# Patient Record
Sex: Female | Born: 1935 | Race: White | Hispanic: No | Marital: Married | Smoking: Never smoker
Health system: Southern US, Community
[De-identification: ages and names within clinical notes are randomized; demographics above are authoritative.]

## PROBLEM LIST (undated history)

## (undated) DIAGNOSIS — G629 Polyneuropathy, unspecified: Secondary | ICD-10-CM

## (undated) DIAGNOSIS — M1990 Unspecified osteoarthritis, unspecified site: Secondary | ICD-10-CM

## (undated) DIAGNOSIS — J449 Chronic obstructive pulmonary disease, unspecified: Secondary | ICD-10-CM

## (undated) DIAGNOSIS — M75100 Unspecified rotator cuff tear or rupture of unspecified shoulder, not specified as traumatic: Secondary | ICD-10-CM

## (undated) DIAGNOSIS — I499 Cardiac arrhythmia, unspecified: Secondary | ICD-10-CM

## (undated) DIAGNOSIS — N390 Urinary tract infection, site not specified: Secondary | ICD-10-CM

## (undated) DIAGNOSIS — I2510 Atherosclerotic heart disease of native coronary artery without angina pectoris: Secondary | ICD-10-CM

## (undated) DIAGNOSIS — F329 Major depressive disorder, single episode, unspecified: Secondary | ICD-10-CM

## (undated) DIAGNOSIS — T8859XA Other complications of anesthesia, initial encounter: Secondary | ICD-10-CM

## (undated) DIAGNOSIS — E785 Hyperlipidemia, unspecified: Secondary | ICD-10-CM

## (undated) DIAGNOSIS — F419 Anxiety disorder, unspecified: Secondary | ICD-10-CM

## (undated) DIAGNOSIS — Z9981 Dependence on supplemental oxygen: Secondary | ICD-10-CM

## (undated) DIAGNOSIS — I509 Heart failure, unspecified: Secondary | ICD-10-CM

## (undated) DIAGNOSIS — T4145XA Adverse effect of unspecified anesthetic, initial encounter: Secondary | ICD-10-CM

## (undated) DIAGNOSIS — M797 Fibromyalgia: Secondary | ICD-10-CM

## (undated) DIAGNOSIS — F32A Depression, unspecified: Secondary | ICD-10-CM

## (undated) DIAGNOSIS — K449 Diaphragmatic hernia without obstruction or gangrene: Secondary | ICD-10-CM

## (undated) HISTORY — DX: Anxiety disorder, unspecified: F41.9

## (undated) HISTORY — PX: APPENDECTOMY: SHX54

## (undated) HISTORY — DX: Unspecified rotator cuff tear or rupture of unspecified shoulder, not specified as traumatic: M75.100

## (undated) HISTORY — DX: Hyperlipidemia, unspecified: E78.5

## (undated) HISTORY — DX: Major depressive disorder, single episode, unspecified: F32.9

## (undated) HISTORY — DX: Depression, unspecified: F32.A

## (undated) HISTORY — DX: Fibromyalgia: M79.7

## (undated) HISTORY — DX: Diaphragmatic hernia without obstruction or gangrene: K44.9

## (undated) HISTORY — PX: CATARACT EXTRACTION: SUR2

## (undated) HISTORY — PX: TONSILLECTOMY: SUR1361

## (undated) HISTORY — PX: ELBOW SURGERY: SHX618

## (undated) HISTORY — PX: DILATION AND CURETTAGE OF UTERUS: SHX78

## (undated) HISTORY — DX: Unspecified osteoarthritis, unspecified site: M19.90

## (undated) HISTORY — DX: Cardiac arrhythmia, unspecified: I49.9

## (undated) HISTORY — DX: Heart failure, unspecified: I50.9

## (undated) HISTORY — DX: Atherosclerotic heart disease of native coronary artery without angina pectoris: I25.10

## (undated) HISTORY — DX: Polyneuropathy, unspecified: G62.9

## (undated) HISTORY — DX: Chronic obstructive pulmonary disease, unspecified: J44.9

---

## 1998-01-26 ENCOUNTER — Emergency Department (HOSPITAL_COMMUNITY): Admission: EM | Admit: 1998-01-26 | Discharge: 1998-01-26 | Payer: Self-pay | Admitting: Emergency Medicine

## 1998-10-10 ENCOUNTER — Other Ambulatory Visit: Admission: RE | Admit: 1998-10-10 | Discharge: 1998-10-10 | Payer: Self-pay | Admitting: Obstetrics and Gynecology

## 1999-05-12 ENCOUNTER — Emergency Department (HOSPITAL_COMMUNITY): Admission: EM | Admit: 1999-05-12 | Discharge: 1999-05-12 | Payer: Self-pay | Admitting: Emergency Medicine

## 1999-05-12 ENCOUNTER — Encounter: Payer: Self-pay | Admitting: Emergency Medicine

## 2000-08-23 ENCOUNTER — Ambulatory Visit (HOSPITAL_COMMUNITY): Admission: RE | Admit: 2000-08-23 | Discharge: 2000-08-23 | Payer: Self-pay | Admitting: Endocrinology

## 2000-08-23 ENCOUNTER — Encounter: Payer: Self-pay | Admitting: Endocrinology

## 2000-10-08 ENCOUNTER — Encounter: Payer: Self-pay | Admitting: Gastroenterology

## 2000-10-08 ENCOUNTER — Encounter: Admission: RE | Admit: 2000-10-08 | Discharge: 2000-10-08 | Payer: Self-pay | Admitting: Gastroenterology

## 2003-11-28 ENCOUNTER — Encounter (INDEPENDENT_AMBULATORY_CARE_PROVIDER_SITE_OTHER): Payer: Self-pay | Admitting: Specialist

## 2003-11-28 ENCOUNTER — Ambulatory Visit (HOSPITAL_COMMUNITY): Admission: RE | Admit: 2003-11-28 | Discharge: 2003-11-28 | Payer: Self-pay | Admitting: Gastroenterology

## 2004-03-19 ENCOUNTER — Encounter: Admission: RE | Admit: 2004-03-19 | Discharge: 2004-03-19 | Payer: Self-pay | Admitting: Gastroenterology

## 2004-10-01 ENCOUNTER — Encounter: Admission: RE | Admit: 2004-10-01 | Discharge: 2004-10-01 | Payer: Self-pay | Admitting: Endocrinology

## 2005-09-13 ENCOUNTER — Emergency Department (HOSPITAL_COMMUNITY): Admission: EM | Admit: 2005-09-13 | Discharge: 2005-09-13 | Payer: Self-pay | Admitting: Emergency Medicine

## 2006-11-17 ENCOUNTER — Ambulatory Visit (HOSPITAL_COMMUNITY): Admission: RE | Admit: 2006-11-17 | Discharge: 2006-11-17 | Payer: Self-pay | Admitting: Endocrinology

## 2008-04-24 ENCOUNTER — Encounter: Admission: RE | Admit: 2008-04-24 | Discharge: 2008-04-24 | Payer: Self-pay | Admitting: Otolaryngology

## 2010-12-19 NOTE — Op Note (Signed)
NAME:  [name], [name]                          ACCOUNT NO.:  [id number]   MEDICAL RECORD NO.:  [id number]                   PATIENT TYPE:  AMB   LOCATION:  ENDO                                 FACILITY:  [facility]   PHYSICIAN:  James L. Malon Kindle., M.D.          DATE OF BIRTH:  May 19, 1927   DATE OF PROCEDURE:  11/28/2003  DATE OF DISCHARGE:                                 OPERATIVE REPORT   PROCEDURE:  Colonoscopy with biopsy.   MEDICATIONS:  Fentanyl 75 mcg, Versed 4 mg IV.   ENDOSCOPE:  Olympus pediatric adjustable colonoscope.   INDICATIONS:  Heme-positive stool in a woman with previous questionable  history of ulcerative colitis.  Did have colitis by biopsy a number of years  ago, but it subsequently resolved.  She has carried a diagnosis of Crohn's  or ulcerative colitis for a number of years.   DESCRIPTION OF PROCEDURE:  The procedure had been explained to the patient  and consent obtained.  With the patient in the left lateral decubitus  position, the Olympus pediatric adjustable colonoscope was inserted and  advanced.  The patient had a long, tortuous colon.  We were eventually able  to reach the cecum with identification of the ileocecal valve.  The scope  was withdrawn and the colon carefully examined.  Multiple biopsies were  taken of endoscopically-normal mucosa throughout.  There was no diverticular  disease.  The scope was withdrawn in the rectum, and the rectum was free of  polyps.  The patient tolerated the procedure well.   ASSESSMENT:  1. Heme-positive stool, 792.1.  2. Normal mucosa endoscopically.   Will check biopsies.                                               James L. Malon Kindle., M.D.    Waldron Session  D:  11/28/2003  T:  11/28/2003  Job:  [id number]   cc:   Jeannett Senior A. Evlyn Kanner, M.D.  [address]  [place]  [place] [address]  Fax: [phone]

## 2011-05-18 ENCOUNTER — Encounter: Payer: Self-pay | Admitting: Pulmonary Disease

## 2011-05-19 ENCOUNTER — Ambulatory Visit (INDEPENDENT_AMBULATORY_CARE_PROVIDER_SITE_OTHER): Payer: Medicare Other | Admitting: Pulmonary Disease

## 2011-05-19 ENCOUNTER — Encounter: Payer: Self-pay | Admitting: Pulmonary Disease

## 2011-05-19 VITALS — BP 122/78 | HR 69 | Temp 98.5°F | Ht 66.0 in | Wt 131.6 lb

## 2011-05-19 DIAGNOSIS — J479 Bronchiectasis, uncomplicated: Secondary | ICD-10-CM | POA: Insufficient documentation

## 2011-05-19 NOTE — Patient Instructions (Signed)
Take nexium 40mg  one each evening, and stay on your prevacid in the am. (samples given) Will schedule for breathing tests, and see you back in 2-3 weeks on the same day Need a sputum culture (this is very important).  Please cough up the day of your visit, and keep in fridge until you come to visit. Get chlorpheniramine 8mg  at drugstore, and take everynight at bedtime to treat for possible postnasal drip.

## 2011-05-19 NOTE — Progress Notes (Signed)
  Subjective:    Patient ID: [name], female    DOB: 1927/05/01, 77 y.o.   MRN: [id number]  HPI The patient is an 77 year old female who is referred for multiple pulmonary issues.  The patient states she was "born with asthma", and has had recurring bouts of pneumonia throughout her life.  She has been told that she has COPD, and has had what sounds like PFTs in the distant past.  Most recently she has had worsening breathing issues, and has been using a rescue inhaler.  This summer she had an episode of pneumonia and was treated with antibiotics.  She keeps a chronic cough, with normally yellow mucus.  She had a recent bout of hemoptysis that was limited, and has now resolved.  She describes a one block dyspnea on exertion at a slow pace, and we'll get winded bringing groceries in from the car.  She also has chronic throat clearing, and describes a globus sensation.  She denies any sinus congestion but does have persistent postnasal drip.  She also has significant reflux symptoms, and takes low dose proton pump inhibitor.  She has had a recent CT chest that I have reviewed, and shows significant bronchiectasis in the right middle lobe, lingula, and left lower lobe.   Review of Systems  Constitutional: Negative for fever and unexpected weight change.  HENT: Positive for sore throat, sneezing, trouble swallowing and dental problem. Negative for ear pain, nosebleeds, congestion, rhinorrhea and postnasal drip.   Eyes: Negative for redness and itching.  Respiratory: Positive for cough and shortness of breath. Negative for chest tightness and wheezing.   Cardiovascular: Positive for chest pain and palpitations. Negative for leg swelling.  Gastrointestinal: Negative for nausea and vomiting.  Musculoskeletal: Positive for joint swelling.  Skin: Negative for rash.  Neurological: Positive for headaches.  Hematological: Does not bruise/bleed easily.  Psychiatric/Behavioral: Positive for dysphoric mood.  The patient is nervous/anxious.        Objective:   Physical Exam Constitutional:  Well developed, no acute distress  HENT:  Nares patent without discharge  Oropharynx without exudate, palate and uvula are normal  Eyes:  Perrla, eomi, no scleral icterus  Neck:  No JVD, no TMG  Cardiovascular:  Irregular rhythm, no rubs or gallops.  No murmurs        Intact distal pulses but decreased  Pulmonary : decreased breath sounds, no stridor or respiratory distress   Scattered crackles, no rhonchi, or wheezing  Abdominal:  Soft, nondistended, bowel sounds present.  No tenderness noted.   Musculoskeletal:  mild lower extremity edema noted, significant varicosities.  Lymph Nodes:  No cervical lymphadenopathy noted  Skin:  No cyanosis noted  Neurologic:  Alert, appropriate, moves all 4 extremities without obvious deficit.         Assessment & Plan:

## 2011-05-19 NOTE — Assessment & Plan Note (Signed)
The pt has significant bronchiectasis on her current ct chest, and has chronic mucus production and recent episode of limited hemoptysis.  I suspect she has underlying copd from her disease, but will need to do pfts for documentation.  I also stressed to her the importance of culturing her respiratory flora, and ruling out unusual pathogens such as gram-negative rods or atypical mycobacteria.  I have asked her to bring a sputum specimen to the next visit.  If she is found to have airflow obstruction on PFTs, she would probably benefit from a bronchodilator/inhaled corticosteroid combination.  Her cough today sounds more upper airway in origin and then lower, and she is describing a classic globus sensation.  She has a history of reflux disease, with breakthrough symptoms despite taking low dose Prevacid.

## 2011-06-16 ENCOUNTER — Ambulatory Visit (INDEPENDENT_AMBULATORY_CARE_PROVIDER_SITE_OTHER): Payer: Medicare Other | Admitting: Pulmonary Disease

## 2011-06-16 ENCOUNTER — Encounter: Payer: Self-pay | Admitting: Pulmonary Disease

## 2011-06-16 ENCOUNTER — Other Ambulatory Visit: Payer: Medicare Other

## 2011-06-16 VITALS — BP 122/84 | HR 79 | Temp 98.3°F | Ht 68.0 in | Wt 128.0 lb

## 2011-06-16 DIAGNOSIS — J479 Bronchiectasis, uncomplicated: Secondary | ICD-10-CM

## 2011-06-16 LAB — PULMONARY FUNCTION TEST

## 2011-06-16 NOTE — Progress Notes (Signed)
  Subjective:    Patient ID: [name], female    DOB: 1926/08/06, 77 y.o.   MRN: [id number]  HPI The patient comes in today for followup of her known bronchiectasis, as well as cough and dyspnea on exertion.  She has had pulmonary function studies today, and this shows no obstruction on spirometry, but does show air trapping on lung volumes.  There is no restriction, and only minimal reduction in her diffusion capacity.  I have reviewed the study with her in detail, and answered all of her questions.  Of note, the patient has brought in a sputum specimen today so that we can identify her colonizing flora.   Review of Systems  Constitutional: Negative for fever and unexpected weight change.  HENT: Positive for congestion, mouth sores, dental problem, postnasal drip and sinus pressure. Negative for ear pain, sore throat, rhinorrhea and trouble swallowing.   Eyes: Positive for itching.  Respiratory: Positive for cough, chest tightness, shortness of breath and wheezing.   Cardiovascular: Positive for palpitations. Negative for leg swelling.  Gastrointestinal: Positive for nausea. Negative for vomiting.  Genitourinary: Positive for dysuria.  Musculoskeletal: Negative for joint swelling.  Skin: Negative for rash.  Neurological: Positive for headaches.  Hematological: Bruises/bleeds easily.  Psychiatric/Behavioral: Positive for dysphoric mood. The patient is nervous/anxious.        Objective:   Physical Exam Thin female in no acute distress Chest with basilar crackles, no wheezes Heart exam with regular rate and rhythm Lower extremities without edema, no cyanosis noted Alert and oriented, moves all 4 extremities.       Assessment & Plan:

## 2011-06-16 NOTE — Patient Instructions (Addendum)
Will try on arcapta one inhalation each am Will call you with the results of your culture Go back to prevacid once a day Use antihistamine at bedtime as needed for postnasal drip Will check immunoglobulin levels to make sure immune system is functioning properly Please call me in 3 weeks with your response to the new inhaler.  Will see you back in 3mos if doing well.

## 2011-06-16 NOTE — Assessment & Plan Note (Signed)
The patient has known bronchiectasis by CT chest, and does have subtle airflow obstruction on her PFTs manifested as air trapping.  Since she does have the complaint of dyspnea on exertion, I would like to try her on a LABA to see if she sees improvement in her breathing.  She also has a persistent cough that I continue to feel is more upper airway than lower in origin.  She did not see a big change with b.i.d. Proton pump inhibitor, but I did ask her to continue with an antihistamine at bedtime since she continues to feel postnasal drip with a globus sensation.  She may have improved mucus ciliary clearance with the long acting beta agonists.

## 2011-06-16 NOTE — Progress Notes (Deleted)
  Subjective:    Patient ID: [name], female    DOB: Aug 09, 1926, 77 y.o.   MRN: [id number]  HPI    Review of Systems  Constitutional: Negative for fever and unexpected weight change.  HENT: Negative for ear pain, nosebleeds, congestion, sore throat, rhinorrhea, sneezing, trouble swallowing, dental problem, postnasal drip and sinus pressure.   Eyes: Negative for redness and itching.  Respiratory: Negative for cough, chest tightness, shortness of breath and wheezing.   Cardiovascular: Negative for palpitations and leg swelling.  Gastrointestinal: Negative for nausea and vomiting.  Genitourinary: Negative for dysuria.  Musculoskeletal: Negative for joint swelling.  Skin: Negative for rash.  Neurological: Negative for headaches.  Hematological: Does not bruise/bleed easily.  Psychiatric/Behavioral: Negative for dysphoric mood. The patient is not nervous/anxious.        Objective:   Physical Exam        Assessment & Plan:

## 2011-06-16 NOTE — Progress Notes (Signed)
PFT done today. 

## 2011-06-17 LAB — IGG, IGA, IGM: IgM, Serum: 112 mg/dL (ref 52–322)

## 2011-06-19 LAB — RESPIRATORY CULTURE OR RESPIRATORY AND SPUTUM CULTURE

## 2011-07-10 ENCOUNTER — Telehealth: Payer: Self-pay | Admitting: Pulmonary Disease

## 2011-07-10 NOTE — Telephone Encounter (Signed)
Her very final culture was negative  If she feels current inhaler is not helping, would like to try her on dulera 100/5  2 inhalations am and pm..rinse mouth well Have her come by and pick up sample and be shown how to use.  She then should call us in 3-4 weeks with update to see if this has helped her.

## 2011-07-10 NOTE — Telephone Encounter (Signed)
I spoke with pt and she states she never got a call back about her final result on her sputum culture. Pt also states she does not feel like the arcapta inhaler is helping with her breathing. She states she can;t tell a difference like she can when she uses her albuterol inhaler. Please advise Dr. Shelle Iron, thanks

## 2011-07-10 NOTE — Telephone Encounter (Signed)
Spoke with pt and notified of recs per [facility]. Pt states that she thinks that she does want to try dulera, but she is unsure of when she would be able to come in and have nurse show her how to use. She states will call us back and let us know if/when she can come in.

## 2011-07-16 DIAGNOSIS — E039 Hypothyroidism, unspecified: Secondary | ICD-10-CM | POA: Diagnosis not present

## 2011-07-16 DIAGNOSIS — D510 Vitamin B12 deficiency anemia due to intrinsic factor deficiency: Secondary | ICD-10-CM | POA: Diagnosis not present

## 2011-07-16 DIAGNOSIS — I2510 Atherosclerotic heart disease of native coronary artery without angina pectoris: Secondary | ICD-10-CM | POA: Diagnosis not present

## 2011-07-16 DIAGNOSIS — J441 Chronic obstructive pulmonary disease with (acute) exacerbation: Secondary | ICD-10-CM | POA: Diagnosis not present

## 2011-07-24 DIAGNOSIS — I2510 Atherosclerotic heart disease of native coronary artery without angina pectoris: Secondary | ICD-10-CM | POA: Diagnosis not present

## 2011-07-24 DIAGNOSIS — E039 Hypothyroidism, unspecified: Secondary | ICD-10-CM | POA: Diagnosis not present

## 2011-07-24 DIAGNOSIS — J441 Chronic obstructive pulmonary disease with (acute) exacerbation: Secondary | ICD-10-CM | POA: Diagnosis not present

## 2011-07-24 DIAGNOSIS — D510 Vitamin B12 deficiency anemia due to intrinsic factor deficiency: Secondary | ICD-10-CM | POA: Diagnosis not present

## 2011-08-07 DIAGNOSIS — I2510 Atherosclerotic heart disease of native coronary artery without angina pectoris: Secondary | ICD-10-CM | POA: Diagnosis not present

## 2011-08-07 DIAGNOSIS — J441 Chronic obstructive pulmonary disease with (acute) exacerbation: Secondary | ICD-10-CM | POA: Diagnosis not present

## 2011-08-07 DIAGNOSIS — E039 Hypothyroidism, unspecified: Secondary | ICD-10-CM | POA: Diagnosis not present

## 2011-08-07 DIAGNOSIS — D510 Vitamin B12 deficiency anemia due to intrinsic factor deficiency: Secondary | ICD-10-CM | POA: Diagnosis not present

## 2011-08-08 ENCOUNTER — Other Ambulatory Visit: Payer: Self-pay | Admitting: Internal Medicine

## 2011-08-08 ENCOUNTER — Telehealth: Payer: Self-pay | Admitting: Internal Medicine

## 2011-08-08 MED ORDER — AMOXICILLIN-POT CLAVULANATE 875-125 MG PO TABS: 1.0000 | ORAL_TABLET | Freq: Two times a day (BID) | ORAL | Status: AC

## 2011-08-08 NOTE — Telephone Encounter (Signed)
Coughing up yellow sputum with some blood streaks, requested antibiotics.  Provided Augmentin.

## 2011-08-22 NOTE — Telephone Encounter (Signed)
error 

## 2011-08-25 DIAGNOSIS — E039 Hypothyroidism, unspecified: Secondary | ICD-10-CM | POA: Diagnosis not present

## 2011-08-25 DIAGNOSIS — J441 Chronic obstructive pulmonary disease with (acute) exacerbation: Secondary | ICD-10-CM | POA: Diagnosis not present

## 2011-08-25 DIAGNOSIS — D510 Vitamin B12 deficiency anemia due to intrinsic factor deficiency: Secondary | ICD-10-CM | POA: Diagnosis not present

## 2011-08-25 DIAGNOSIS — I2510 Atherosclerotic heart disease of native coronary artery without angina pectoris: Secondary | ICD-10-CM | POA: Diagnosis not present

## 2011-09-01 DIAGNOSIS — E785 Hyperlipidemia, unspecified: Secondary | ICD-10-CM | POA: Diagnosis not present

## 2011-09-01 DIAGNOSIS — M810 Age-related osteoporosis without current pathological fracture: Secondary | ICD-10-CM | POA: Diagnosis not present

## 2011-09-01 DIAGNOSIS — E538 Deficiency of other specified B group vitamins: Secondary | ICD-10-CM | POA: Diagnosis not present

## 2011-09-01 DIAGNOSIS — J479 Bronchiectasis, uncomplicated: Secondary | ICD-10-CM | POA: Diagnosis not present

## 2011-09-01 DIAGNOSIS — J189 Pneumonia, unspecified organism: Secondary | ICD-10-CM | POA: Diagnosis not present

## 2011-09-01 DIAGNOSIS — E039 Hypothyroidism, unspecified: Secondary | ICD-10-CM | POA: Diagnosis not present

## 2011-09-01 DIAGNOSIS — I2510 Atherosclerotic heart disease of native coronary artery without angina pectoris: Secondary | ICD-10-CM | POA: Diagnosis not present

## 2011-09-10 DIAGNOSIS — M25569 Pain in unspecified knee: Secondary | ICD-10-CM | POA: Diagnosis not present

## 2011-09-12 DIAGNOSIS — J441 Chronic obstructive pulmonary disease with (acute) exacerbation: Secondary | ICD-10-CM | POA: Diagnosis not present

## 2011-09-12 DIAGNOSIS — I2510 Atherosclerotic heart disease of native coronary artery without angina pectoris: Secondary | ICD-10-CM | POA: Diagnosis not present

## 2011-09-12 DIAGNOSIS — D510 Vitamin B12 deficiency anemia due to intrinsic factor deficiency: Secondary | ICD-10-CM | POA: Diagnosis not present

## 2011-09-12 DIAGNOSIS — E039 Hypothyroidism, unspecified: Secondary | ICD-10-CM | POA: Diagnosis not present

## 2011-09-14 DIAGNOSIS — J441 Chronic obstructive pulmonary disease with (acute) exacerbation: Secondary | ICD-10-CM | POA: Diagnosis not present

## 2011-09-14 DIAGNOSIS — M25569 Pain in unspecified knee: Secondary | ICD-10-CM | POA: Diagnosis not present

## 2011-09-14 DIAGNOSIS — D510 Vitamin B12 deficiency anemia due to intrinsic factor deficiency: Secondary | ICD-10-CM | POA: Diagnosis not present

## 2011-09-14 DIAGNOSIS — I2510 Atherosclerotic heart disease of native coronary artery without angina pectoris: Secondary | ICD-10-CM | POA: Diagnosis not present

## 2011-09-14 DIAGNOSIS — E039 Hypothyroidism, unspecified: Secondary | ICD-10-CM | POA: Diagnosis not present

## 2011-09-15 ENCOUNTER — Ambulatory Visit: Payer: Medicare Other | Admitting: Pulmonary Disease

## 2011-09-23 ENCOUNTER — Ambulatory Visit (INDEPENDENT_AMBULATORY_CARE_PROVIDER_SITE_OTHER): Payer: Medicare Other | Admitting: Pulmonary Disease

## 2011-09-23 ENCOUNTER — Encounter: Payer: Self-pay | Admitting: Pulmonary Disease

## 2011-09-23 VITALS — BP 120/78 | HR 68 | Temp 98.3°F | Ht 68.0 in | Wt 132.2 lb

## 2011-09-23 DIAGNOSIS — J479 Bronchiectasis, uncomplicated: Secondary | ICD-10-CM | POA: Diagnosis not present

## 2011-09-23 MED ORDER — ALBUTEROL SULFATE HFA 108 (90 BASE) MCG/ACT IN AERS: 2.0000 | INHALATION_SPRAY | Freq: Four times a day (QID) | RESPIRATORY_TRACT | Status: DC | PRN

## 2011-09-23 NOTE — Patient Instructions (Signed)
Continue on albuterol as needed for shortness of breath Stay as active as possible, keep strength up. followup with me in 6mos, but call if you have worsening congestion or increased mucus production.

## 2011-09-23 NOTE — Progress Notes (Signed)
  Subjective:    Patient ID: [name], female    DOB: 07/01/1927, 78 y.o.   MRN: [id number]  HPI Patient in today for followup of her known bronchiectasis.  She also has some mild airflow obstruction manifesting as air trapping.  She has been tried on a long-acting beta agonist, as well as dulera, and has not seen any improvement in her breathing over generic albuterol as needed.  The patient currently has no significant chest congestion, and only scant pale mucous.  She does not feel that she has chest congestion, but has been having issues with a dental abscess for which she is currently on abx.    Review of Systems  Constitutional: Negative for fever and unexpected weight change.  HENT: Positive for rhinorrhea and dental problem. Negative for ear pain, nosebleeds, congestion, sore throat, sneezing, trouble swallowing, postnasal drip and sinus pressure.   Eyes: Positive for itching. Negative for redness.  Respiratory: Positive for cough, chest tightness, shortness of breath and wheezing.   Cardiovascular: Negative for palpitations and leg swelling.  Gastrointestinal: Negative for nausea and vomiting.  Genitourinary: Negative for dysuria.  Musculoskeletal: Positive for joint swelling.  Skin: Negative for rash.  Neurological: Negative for headaches.  Hematological: Does not bruise/bleed easily.  Psychiatric/Behavioral: Negative for dysphoric mood. The patient is not nervous/anxious.        Objective:   Physical Exam Frail-appearing female in no acute distress Nose without purulence or discharge noted Chest with a few scattered crackles, but otherwise clear and free of wheezing Cardiac exam with regular rate and rhythm Lower extremities without significant edema, no cyanosis Alert and oriented, moves all 4 extremities.       Assessment & Plan:

## 2011-09-23 NOTE — Assessment & Plan Note (Signed)
CT chest 2012:  Bronchiectasis in RML, lingula, LLL. PFT's 06/2011: +airtrapping, no restriction, DLCO 76% pred.  Sputum 06/2011: normal flora, no AFB seen Serum Ig's 2012: normal levels.  No change in breathing or mucus with a trial of arcapta and dulera.   The patient appears to be stable from a bronchiectasis standpoint, and is at her usual baseline with respect to cough with mucous production.  She really did not see a difference in her breathing with arcapta or dulera, and wishes to stay on albuterol as needed only.  I have again instructed her on the red flags for bronchiectasis patients that indicate she needs treatment with antibiotics.  I have reiterated again that we can never completely sterilize her pulmonary secretions, and attempts to do so will only result in resistant organisms.  If doing well, she will followup with me again in 6 months.

## 2011-10-05 DIAGNOSIS — M25569 Pain in unspecified knee: Secondary | ICD-10-CM | POA: Diagnosis not present

## 2011-10-05 DIAGNOSIS — M19019 Primary osteoarthritis, unspecified shoulder: Secondary | ICD-10-CM | POA: Diagnosis not present

## 2011-10-12 ENCOUNTER — Ambulatory Visit (INDEPENDENT_AMBULATORY_CARE_PROVIDER_SITE_OTHER)
Admission: RE | Admit: 2011-10-12 | Discharge: 2011-10-12 | Disposition: A | Discharge status: Home / Self Care | Payer: Medicare Other | Source: Ambulatory Visit | Attending: Pulmonary Disease | Admitting: Pulmonary Disease

## 2011-10-12 ENCOUNTER — Encounter: Payer: Self-pay | Admitting: Pulmonary Disease

## 2011-10-12 ENCOUNTER — Ambulatory Visit (INDEPENDENT_AMBULATORY_CARE_PROVIDER_SITE_OTHER): Payer: Medicare Other | Admitting: Pulmonary Disease

## 2011-10-12 VITALS — BP 118/70 | HR 93 | Temp 98.2°F | Ht 67.5 in | Wt 129.8 lb

## 2011-10-12 DIAGNOSIS — R079 Chest pain, unspecified: Secondary | ICD-10-CM | POA: Diagnosis not present

## 2011-10-12 DIAGNOSIS — R0602 Shortness of breath: Secondary | ICD-10-CM | POA: Diagnosis not present

## 2011-10-12 DIAGNOSIS — J479 Bronchiectasis, uncomplicated: Secondary | ICD-10-CM | POA: Diagnosis not present

## 2011-10-12 DIAGNOSIS — R042 Hemoptysis: Secondary | ICD-10-CM | POA: Diagnosis not present

## 2011-10-12 DIAGNOSIS — J449 Chronic obstructive pulmonary disease, unspecified: Secondary | ICD-10-CM | POA: Diagnosis not present

## 2011-10-12 MED ORDER — LEVOFLOXACIN 750 MG PO TABS: 750.0000 mg | ORAL_TABLET | Freq: Every day | ORAL | Status: AC

## 2011-10-12 NOTE — Progress Notes (Signed)
  Subjective:    Patient ID: [name], female    DOB: 07-22-1927, 78 y.o.   MRN: [id number]  HPI The patient comes in today for an acute sick visit.  She has known bronchiectasis, but had been doing fairly well.  She recently began to cough up bright and dark red blood, but it is difficult for her to tell if it is coming from her throat or out of her chest.  She denies any epistaxis, but has had some bleeding from her gums.  She also has had recent dental work for an abscess.  She denies significant chest congestion, but does cough up purulent mucus at times.  She has also had sweats.  Her quantity of hemoptysis has been quite small.   Review of Systems  Constitutional: Positive for fever.  HENT: Positive for congestion, sore throat, rhinorrhea, trouble swallowing, dental problem and postnasal drip. Negative for ear pain, nosebleeds, sneezing and sinus pressure.   Eyes: Positive for redness and itching.  Respiratory: Positive for cough, chest tightness, shortness of breath and wheezing.   Cardiovascular: Positive for palpitations and leg swelling.  Gastrointestinal: Positive for nausea. Negative for vomiting.  Genitourinary: Negative for dysuria.  Musculoskeletal: Positive for joint swelling.  Skin: Negative for rash.  Neurological: Positive for headaches.  Hematological: Bruises/bleeds easily.  Psychiatric/Behavioral: Positive for dysphoric mood. The patient is nervous/anxious.        Objective:   Physical Exam Thin, frail, female in no acute distress Nose without purulence or bleeding noted Oropharynx without obvious bleeding site No cervical lymphadenopathy noted Chest with mildly decreased breath sounds, a few crackles, no wheezes Cardiac exam with regular rate and rhythm Lower extremities without edema, no cyanosis and no calf tenderness Alert and oriented, moves all 4 extremities.      Assessment & Plan:

## 2011-10-12 NOTE — Patient Instructions (Signed)
Will treat with levaquin 750mg  one a day for 7 days. Will check a cxr today, and will call you with results. If you continue to have throat discomfort, and coughing up blood, will have ENT take a look at your voice box.  Let me know if you are not better.

## 2011-10-12 NOTE — Assessment & Plan Note (Signed)
The patient has hemoptysis of the source, but with her history of bronchiectasis, this may simply represent an early acute exacerbation.  She does not have any evidence for bleeding in the upper airway by my exam, but it is limited.  She does have some ongoing throat issues, and therefore if her hemoptysis does not resolve with a course of antibiotics, would have her evaluated by otolaryngology.  We'll check a chest x-ray today for completeness.

## 2011-10-13 ENCOUNTER — Telehealth: Payer: Self-pay | Admitting: Pulmonary Disease

## 2011-10-13 ENCOUNTER — Other Ambulatory Visit: Payer: Self-pay | Admitting: Endocrinology

## 2011-10-13 DIAGNOSIS — D510 Vitamin B12 deficiency anemia due to intrinsic factor deficiency: Secondary | ICD-10-CM | POA: Diagnosis not present

## 2011-10-13 DIAGNOSIS — J441 Chronic obstructive pulmonary disease with (acute) exacerbation: Secondary | ICD-10-CM | POA: Diagnosis not present

## 2011-10-13 DIAGNOSIS — E039 Hypothyroidism, unspecified: Secondary | ICD-10-CM | POA: Diagnosis not present

## 2011-10-13 DIAGNOSIS — E041 Nontoxic single thyroid nodule: Secondary | ICD-10-CM

## 2011-10-13 DIAGNOSIS — R9389 Abnormal findings on diagnostic imaging of other specified body structures: Secondary | ICD-10-CM

## 2011-10-13 DIAGNOSIS — I2510 Atherosclerotic heart disease of native coronary artery without angina pectoris: Secondary | ICD-10-CM | POA: Diagnosis not present

## 2011-10-13 NOTE — Telephone Encounter (Signed)
We did not order visiting nurse, nor do I do this for a pt because they take an abx..  This needs to be handled by primary md if he or she chooses to do so.

## 2011-10-13 NOTE — Progress Notes (Signed)
Quick Note:  Spoke with pt and notified of results per Dr. Shelle Iron. Pt verbalized understanding and denied any questions. Order for ct chest was sent to [facility] ______

## 2011-10-13 NOTE — Telephone Encounter (Signed)
Spoke with Rosann Auerbach, nurse with [facility]. She states needs VO to visit the pt twice per wk for the next 2 wks. She states that this is b/c we prescribed abx for the pt. Please advise if this is okay, thanks! I spoke with the pt and made her aware of cxr results/recs and she verbalized understanding and states okay to order CT Chest. Order was sent to [facility].

## 2011-10-14 ENCOUNTER — Ambulatory Visit
Admission: RE | Admit: 2011-10-14 | Discharge: 2011-10-14 | Disposition: A | Discharge status: Home / Self Care | Payer: Medicare Other | Source: Ambulatory Visit | Attending: Endocrinology | Admitting: Endocrinology

## 2011-10-14 DIAGNOSIS — E041 Nontoxic single thyroid nodule: Secondary | ICD-10-CM | POA: Diagnosis not present

## 2011-10-14 NOTE — Telephone Encounter (Signed)
Spoke with Trish and notified of recs per KC. She verbalized understanding and states nothing further needed.

## 2011-10-16 ENCOUNTER — Other Ambulatory Visit: Payer: Medicare Other

## 2011-10-19 ENCOUNTER — Ambulatory Visit (INDEPENDENT_AMBULATORY_CARE_PROVIDER_SITE_OTHER)
Admission: RE | Admit: 2011-10-19 | Discharge: 2011-10-19 | Disposition: A | Discharge status: Home / Self Care | Payer: Medicare Other | Source: Ambulatory Visit | Attending: Pulmonary Disease | Admitting: Pulmonary Disease

## 2011-10-19 DIAGNOSIS — J984 Other disorders of lung: Secondary | ICD-10-CM | POA: Diagnosis not present

## 2011-10-19 DIAGNOSIS — J479 Bronchiectasis, uncomplicated: Secondary | ICD-10-CM | POA: Diagnosis not present

## 2011-10-19 DIAGNOSIS — R918 Other nonspecific abnormal finding of lung field: Secondary | ICD-10-CM | POA: Diagnosis not present

## 2011-10-19 DIAGNOSIS — R9389 Abnormal findings on diagnostic imaging of other specified body structures: Secondary | ICD-10-CM

## 2011-10-27 DIAGNOSIS — S8010XA Contusion of unspecified lower leg, initial encounter: Secondary | ICD-10-CM | POA: Diagnosis not present

## 2011-10-27 DIAGNOSIS — M7989 Other specified soft tissue disorders: Secondary | ICD-10-CM | POA: Diagnosis not present

## 2011-11-04 ENCOUNTER — Telehealth: Payer: Self-pay | Admitting: Pulmonary Disease

## 2011-11-04 NOTE — Telephone Encounter (Signed)
As long as the quantity is small, would like to check a sputum culture again to see if we can identify bugs that are colonizing her lungs.  The culture was negative last year, but this is another opportunity.  Have her come by and get cup, and prefer first thing in am specimen.  Bring to lab within 2 hours if possible, or put in fridge until she can. Will call her once we get results.  If she begins to cough up more blood, or if she is becoming more symptomatic (increased cough, sob, etc.) is to call us.  Send sputum for routine culture and sensitivity afb smear and culture.

## 2011-11-04 NOTE — Telephone Encounter (Signed)
Again I advised that nothing could be done for her in our office at 5:16 pm and that she needed to go to ED. She did not want to go there (afraid that she'll catch germs and have to sit around a long time). Asked me again to get a msg. to nurse.

## 2011-11-04 NOTE — Telephone Encounter (Signed)
She needs to go to ER if this is the case.

## 2011-11-04 NOTE — Telephone Encounter (Signed)
Pt walked into the office rather than calling back. I explained to her the below recs per [facility]. She verbalized understanding. She brought a sample of her sputum from this am which was bright red in color and approx the size of a nickel in diameter. She states that this was all she coughed up and has had nothing since. No changes in her breathing, fever, CP or other complaints. She asked about her CT Chest results also which were already explained to her, and I advised that [facility] can discuss this more in detail at her next ov. She was okay with this. She took the specimen cup with her and is aware am sample preferred, and to bring in within 2 hours of production if possible. Order placed for labs. I have advised the pt that if her symptoms persist/worsen or starts coughing up large amounts of blood needs to go to ED asap. She verbalized understanding of this.

## 2011-11-04 NOTE — Telephone Encounter (Signed)
[email or web address]  

## 2011-11-04 NOTE — Telephone Encounter (Signed)
Patient says she is clearing her throat more today and coughing up small amounts of bright red blood. Things did clear up after taking course of Levaquin given on 10/12/11. Pt denies any sob, chest pain, wheezing or fever. Pls advise.No Known Allergies   Pls call pt at mobile # [phone] if she does not answer at home #.

## 2011-11-04 NOTE — Telephone Encounter (Signed)
I spoke with pt and she states she has started coughing up "large globs of blood" since she had been here earlier. Also she states her leg is bothering her and is in bad shape. i advised pt she needed to get someone to take her to the ED or call 911. Pt keep refusing stating she is not sure what she is going to do. Again I keep insisting she go to the ED. Will forward to [facility] so he is aware

## 2011-11-05 ENCOUNTER — Telehealth: Payer: Self-pay | Admitting: Pulmonary Disease

## 2011-11-05 NOTE — Telephone Encounter (Signed)
Called and spoke with pt. She states that she did take our advise on going to ED last night b/c hemoptysis "just stopped" and states that this has not returned since last episode prompting her last call to [place]. She states that she feels well today and had no complaints. I advised should her symptoms return to seek emergency care. Pt verbalized understanding. Will forward to [facility] so he is aware.

## 2011-11-10 DIAGNOSIS — E039 Hypothyroidism, unspecified: Secondary | ICD-10-CM | POA: Diagnosis not present

## 2011-11-10 DIAGNOSIS — J441 Chronic obstructive pulmonary disease with (acute) exacerbation: Secondary | ICD-10-CM | POA: Diagnosis not present

## 2011-11-10 DIAGNOSIS — I2510 Atherosclerotic heart disease of native coronary artery without angina pectoris: Secondary | ICD-10-CM | POA: Diagnosis not present

## 2011-11-10 DIAGNOSIS — D510 Vitamin B12 deficiency anemia due to intrinsic factor deficiency: Secondary | ICD-10-CM | POA: Diagnosis not present

## 2011-11-13 DIAGNOSIS — J441 Chronic obstructive pulmonary disease with (acute) exacerbation: Secondary | ICD-10-CM | POA: Diagnosis not present

## 2011-11-13 DIAGNOSIS — D510 Vitamin B12 deficiency anemia due to intrinsic factor deficiency: Secondary | ICD-10-CM | POA: Diagnosis not present

## 2011-11-13 DIAGNOSIS — I2510 Atherosclerotic heart disease of native coronary artery without angina pectoris: Secondary | ICD-10-CM | POA: Diagnosis not present

## 2011-11-13 DIAGNOSIS — E039 Hypothyroidism, unspecified: Secondary | ICD-10-CM | POA: Diagnosis not present

## 2011-11-16 DIAGNOSIS — M1710 Unilateral primary osteoarthritis, unspecified knee: Secondary | ICD-10-CM | POA: Diagnosis not present

## 2011-11-23 DIAGNOSIS — M1710 Unilateral primary osteoarthritis, unspecified knee: Secondary | ICD-10-CM | POA: Diagnosis not present

## 2011-12-02 DIAGNOSIS — M1710 Unilateral primary osteoarthritis, unspecified knee: Secondary | ICD-10-CM | POA: Diagnosis not present

## 2011-12-03 NOTE — Telephone Encounter (Signed)
KC, pls sign this encounter unless there is something further needed thanks

## 2011-12-09 DIAGNOSIS — M1710 Unilateral primary osteoarthritis, unspecified knee: Secondary | ICD-10-CM | POA: Diagnosis not present

## 2011-12-10 DIAGNOSIS — I2510 Atherosclerotic heart disease of native coronary artery without angina pectoris: Secondary | ICD-10-CM | POA: Diagnosis not present

## 2011-12-10 DIAGNOSIS — D510 Vitamin B12 deficiency anemia due to intrinsic factor deficiency: Secondary | ICD-10-CM | POA: Diagnosis not present

## 2011-12-10 DIAGNOSIS — J441 Chronic obstructive pulmonary disease with (acute) exacerbation: Secondary | ICD-10-CM | POA: Diagnosis not present

## 2011-12-10 DIAGNOSIS — E039 Hypothyroidism, unspecified: Secondary | ICD-10-CM | POA: Diagnosis not present

## 2011-12-16 DIAGNOSIS — M1710 Unilateral primary osteoarthritis, unspecified knee: Secondary | ICD-10-CM | POA: Diagnosis not present

## 2011-12-31 DIAGNOSIS — M810 Age-related osteoporosis without current pathological fracture: Secondary | ICD-10-CM | POA: Diagnosis not present

## 2011-12-31 DIAGNOSIS — E041 Nontoxic single thyroid nodule: Secondary | ICD-10-CM | POA: Diagnosis not present

## 2011-12-31 DIAGNOSIS — J479 Bronchiectasis, uncomplicated: Secondary | ICD-10-CM | POA: Diagnosis not present

## 2011-12-31 DIAGNOSIS — E538 Deficiency of other specified B group vitamins: Secondary | ICD-10-CM | POA: Diagnosis not present

## 2011-12-31 DIAGNOSIS — J449 Chronic obstructive pulmonary disease, unspecified: Secondary | ICD-10-CM | POA: Diagnosis not present

## 2012-01-04 DIAGNOSIS — Z961 Presence of intraocular lens: Secondary | ICD-10-CM | POA: Diagnosis not present

## 2012-01-04 DIAGNOSIS — H2510 Age-related nuclear cataract, unspecified eye: Secondary | ICD-10-CM | POA: Diagnosis not present

## 2012-01-04 DIAGNOSIS — H40019 Open angle with borderline findings, low risk, unspecified eye: Secondary | ICD-10-CM | POA: Diagnosis not present

## 2012-01-04 DIAGNOSIS — H353190 Nonexudative age-related macular degeneration, unspecified eye, stage unspecified: Secondary | ICD-10-CM | POA: Diagnosis not present

## 2012-01-07 DIAGNOSIS — D510 Vitamin B12 deficiency anemia due to intrinsic factor deficiency: Secondary | ICD-10-CM | POA: Diagnosis not present

## 2012-01-07 DIAGNOSIS — J441 Chronic obstructive pulmonary disease with (acute) exacerbation: Secondary | ICD-10-CM | POA: Diagnosis not present

## 2012-01-07 DIAGNOSIS — E039 Hypothyroidism, unspecified: Secondary | ICD-10-CM | POA: Diagnosis not present

## 2012-01-07 DIAGNOSIS — I2510 Atherosclerotic heart disease of native coronary artery without angina pectoris: Secondary | ICD-10-CM | POA: Diagnosis not present

## 2012-01-12 DIAGNOSIS — J441 Chronic obstructive pulmonary disease with (acute) exacerbation: Secondary | ICD-10-CM | POA: Diagnosis not present

## 2012-01-12 DIAGNOSIS — D510 Vitamin B12 deficiency anemia due to intrinsic factor deficiency: Secondary | ICD-10-CM | POA: Diagnosis not present

## 2012-01-12 DIAGNOSIS — E039 Hypothyroidism, unspecified: Secondary | ICD-10-CM | POA: Diagnosis not present

## 2012-01-12 DIAGNOSIS — I2510 Atherosclerotic heart disease of native coronary artery without angina pectoris: Secondary | ICD-10-CM | POA: Diagnosis not present

## 2012-01-12 DIAGNOSIS — J449 Chronic obstructive pulmonary disease, unspecified: Secondary | ICD-10-CM | POA: Diagnosis not present

## 2012-01-29 ENCOUNTER — Telehealth: Payer: Self-pay | Admitting: Pulmonary Disease

## 2012-01-29 NOTE — Telephone Encounter (Signed)
Called, spoke with pt.  I informed her of below per Dr. Shelle Iron.  She verbalized understanding of this and states she will try to cough sputum up Monday morning.  She is aware to call back if things worsen.

## 2012-01-29 NOTE — Telephone Encounter (Signed)
Called, spoke with pt who reports she has seen "a little bit of blood stains in the yellow mucus" today.  States this is a tinge of light red blood.  Denies any increased SOB, no wheezing, some chest tightness but no worse from baseline, and denies f/c/s.  Requesting generic abx.  Dr. Shelle Iron, pls advise.  Thank you  nkda - verified  [facility] [place]

## 2012-01-29 NOTE — Telephone Encounter (Signed)
It sounds like there is very little blood or mucus.  We do not want to overtreat her with abx, or we will get resistant bugs.  Still did not get a sputum specimen to know what bugs are colonizing her lungs.   Would like to hold off on abx, but call if things worsen. Would like her to take the sputum cup she has and cough up a specimen Monday morning and bring into the lab for culture.  That way if she gets worse, we know what bug she may have.

## 2012-02-02 DIAGNOSIS — I2510 Atherosclerotic heart disease of native coronary artery without angina pectoris: Secondary | ICD-10-CM | POA: Diagnosis not present

## 2012-02-02 DIAGNOSIS — E039 Hypothyroidism, unspecified: Secondary | ICD-10-CM | POA: Diagnosis not present

## 2012-02-02 DIAGNOSIS — D510 Vitamin B12 deficiency anemia due to intrinsic factor deficiency: Secondary | ICD-10-CM | POA: Diagnosis not present

## 2012-02-02 DIAGNOSIS — J441 Chronic obstructive pulmonary disease with (acute) exacerbation: Secondary | ICD-10-CM | POA: Diagnosis not present

## 2012-02-17 ENCOUNTER — Other Ambulatory Visit: Payer: Medicare Other

## 2012-02-17 DIAGNOSIS — R042 Hemoptysis: Secondary | ICD-10-CM | POA: Diagnosis not present

## 2012-02-20 LAB — RESPIRATORY CULTURE OR RESPIRATORY AND SPUTUM CULTURE

## 2012-02-24 ENCOUNTER — Telehealth: Payer: Self-pay | Admitting: Pulmonary Disease

## 2012-02-24 NOTE — Telephone Encounter (Signed)
She was seen in March by Dr. Shelle Iron for hemoptysis.  She called back again in April and June with hemoptysis.  She was treated with levaquin in March, but Dr. Shelle Iron opted not to give additional antibiotics after this.  She had dropped off sputum sample 07/17, and results are pending.  She has cough with yellow sputum which is chronic.  She had some faint red streak in the sputum this morning.  She denies fever, chills, sinus congestion, sore throat, wheeze, or chest pain.  I have advised her to monitor her symptoms, and call back or go to the ER if this gets worse.  Will hold off on antibiotics for now, and await results of sputum sample.  Will forward message to Dr. Shelle Iron so he can f/u on sputum specimen.

## 2012-02-24 NOTE — Telephone Encounter (Signed)
Spoke with pt. She states that she has prod cough "as usual"-but this am produced moderate yellow sputum with streaks of light red blood. She also states "I just feel horrible". She states that she is needing abx called in. I advised will need appt and offered ov with MW for tomorrow. She refused, stating has dentist appt and can not come tomorrow or Friday, because she lives so far away. She states no changes in breathing, no CP or fever. VS, please advise recs thanks!

## 2012-03-01 NOTE — Telephone Encounter (Signed)
Let pt know that her bacterial culture did not grow anything. Her other culture for MAC is pending, and may take another 2-4 weeks before finalized. I would like to see her for ov to discuss her hemoptysis again.  ? This week spot available?

## 2012-03-02 NOTE — Telephone Encounter (Signed)
I spoke with pt and is aware of this. She states she has not coughed up blood in the past couple of days. She states she will wait on making an appt right now and will call back if this happens again. Will forward to Dr [facility] as ana fyi

## 2012-03-02 NOTE — Telephone Encounter (Signed)
Noted  

## 2012-03-09 DIAGNOSIS — D510 Vitamin B12 deficiency anemia due to intrinsic factor deficiency: Secondary | ICD-10-CM | POA: Diagnosis not present

## 2012-03-09 DIAGNOSIS — E039 Hypothyroidism, unspecified: Secondary | ICD-10-CM | POA: Diagnosis not present

## 2012-03-09 DIAGNOSIS — J441 Chronic obstructive pulmonary disease with (acute) exacerbation: Secondary | ICD-10-CM | POA: Diagnosis not present

## 2012-03-09 DIAGNOSIS — I2510 Atherosclerotic heart disease of native coronary artery without angina pectoris: Secondary | ICD-10-CM | POA: Diagnosis not present

## 2012-03-14 DIAGNOSIS — M1710 Unilateral primary osteoarthritis, unspecified knee: Secondary | ICD-10-CM | POA: Diagnosis not present

## 2012-03-15 ENCOUNTER — Telehealth: Payer: Self-pay | Admitting: Pulmonary Disease

## 2012-03-15 ENCOUNTER — Ambulatory Visit (INDEPENDENT_AMBULATORY_CARE_PROVIDER_SITE_OTHER): Payer: Medicare Other | Admitting: Pulmonary Disease

## 2012-03-15 ENCOUNTER — Encounter: Payer: Self-pay | Admitting: Pulmonary Disease

## 2012-03-15 VITALS — BP 116/74 | HR 92 | Temp 97.7°F | Ht 67.5 in | Wt 131.0 lb

## 2012-03-15 DIAGNOSIS — J479 Bronchiectasis, uncomplicated: Secondary | ICD-10-CM

## 2012-03-15 DIAGNOSIS — R042 Hemoptysis: Secondary | ICD-10-CM

## 2012-03-15 NOTE — Telephone Encounter (Signed)
Spoke with pt who states she was at Dr. Barbaraann Faster office with her husband and was advised to come see Dr. Shelle Iron.  Reports hemoptysis with bright red blood that started this am.  States she feels "really, really bad."  Has SOB but is at baseline.  C/o chest tightness/pressure with the humidity and sweats.  No fever or wheezing.  Requesting recs and results of culture from July.  Spoke with Dr. Shelle Iron.  Will see pt today at 4:15 pm -- appt scheduled.  Pt aware.

## 2012-03-15 NOTE — Assessment & Plan Note (Signed)
The patient currently has no evidence to suggest an acute exacerbation.

## 2012-03-15 NOTE — Patient Instructions (Addendum)
Think about taking a look into your lungs to evaluate the blood in your mucus and get a good culture from deep in the lung.  Otherwise, I would continue to follow this and see how you do.

## 2012-03-15 NOTE — Progress Notes (Signed)
  Subjective:    Patient ID: [name], female    DOB: 1926/10/10, 78 y.o.   MRN: [id number]  HPI The patient comes in today for an acute sick visit.  She has known bronchiectasis, and has intermittent episodes of scant hemoptysis.  Each time that she has had hemoptysis, I have only seen minimal streaks of dark and bright red blood within mucus on a Kleenex.  She tells me that she has had more severe episodes in the past.  We have tried to culture her sputum in order to determine her colonizing flora and also to evaluate for possible MAC, but she has never been able to produce a reasonable specimen.  She comes in today where she's had a few day history of streaked hemoptysis, And brings in a specimen on a Kleenex today that she states is representative of the past 48 hours.  This only shows a small quantity of white mucus with brown streaks but no clots.  She also notes some mild chest tightness but no significant increase in shortness of breath.  She has not wanted to stay on any type of chronic bronchodilator regimen.   Review of Systems  Constitutional: Negative for fever and unexpected weight change.  HENT: Negative for ear pain, nosebleeds, congestion, sore throat, rhinorrhea, sneezing, trouble swallowing, dental problem, postnasal drip and sinus pressure.   Eyes: Negative for redness and itching.  Respiratory: Positive for cough and shortness of breath. Negative for chest tightness and wheezing.   Cardiovascular: Negative for palpitations and leg swelling.  Gastrointestinal: Negative for nausea and vomiting.  Genitourinary: Negative for dysuria.  Musculoskeletal: Negative for joint swelling.  Skin: Negative for rash.  Neurological: Negative for headaches.  Hematological: Bruises/bleeds easily.  Psychiatric/Behavioral: Negative for dysphoric mood. The patient is nervous/anxious.   All other systems reviewed and are negative.       Objective:   Physical Exam Thin frail female in  no acute distress Nose without purulence or discharge noted.  No epistaxis Oropharynx clear Neck without JVD or lymphadenopathy Chest with a few scattered crackles, but no wheezes or rhonchi Cardiac exam with regular rate and rhythm Lower extremities with minimal ankle edema, no cyanosis Alert and oriented, moves all 4 extremities.       Assessment & Plan:

## 2012-03-15 NOTE — Assessment & Plan Note (Signed)
The patient has a history of intermittent scant hemoptysis, and she continues to be worried about the risk of "exsanguination".  I have explained to her that it is very common for bronchiectasis patients to cough up small quantities of blood intermittently.  I have offered to do fiberoptic bronchoscopy in order to evaluate her airways and also obtain a good culture, but she is not sure she wants to do this.  The other option is the conservative approach of watchful waiting.  Her last CT chest was stable, and she has not had any significant hemoptysis episode to date.  This does not mean that it cannot happen, but I think she is in a low risk group currently.  The patient will go home and discuss with her family the possibility of doing bronchoscopy.  Otherwise she will followup with me at her scheduled visit, but is to call if she has worsening symptoms.

## 2012-03-23 ENCOUNTER — Ambulatory Visit: Payer: Medicare Other | Admitting: Pulmonary Disease

## 2012-04-03 LAB — AFB CULTURE WITH SMEAR (NOT AT ARMC)

## 2012-04-11 DIAGNOSIS — J449 Chronic obstructive pulmonary disease, unspecified: Secondary | ICD-10-CM | POA: Diagnosis not present

## 2012-04-11 DIAGNOSIS — I509 Heart failure, unspecified: Secondary | ICD-10-CM | POA: Diagnosis not present

## 2012-04-11 DIAGNOSIS — J45909 Unspecified asthma, uncomplicated: Secondary | ICD-10-CM | POA: Diagnosis not present

## 2012-04-11 DIAGNOSIS — M159 Polyosteoarthritis, unspecified: Secondary | ICD-10-CM | POA: Diagnosis not present

## 2012-04-11 DIAGNOSIS — F329 Major depressive disorder, single episode, unspecified: Secondary | ICD-10-CM | POA: Diagnosis not present

## 2012-04-11 DIAGNOSIS — I2510 Atherosclerotic heart disease of native coronary artery without angina pectoris: Secondary | ICD-10-CM | POA: Diagnosis not present

## 2012-04-12 ENCOUNTER — Other Ambulatory Visit: Payer: Self-pay | Admitting: Pulmonary Disease

## 2012-04-12 ENCOUNTER — Other Ambulatory Visit: Payer: Medicare Other

## 2012-04-12 DIAGNOSIS — R042 Hemoptysis: Secondary | ICD-10-CM | POA: Diagnosis not present

## 2012-04-14 DIAGNOSIS — I509 Heart failure, unspecified: Secondary | ICD-10-CM | POA: Diagnosis not present

## 2012-04-14 DIAGNOSIS — J449 Chronic obstructive pulmonary disease, unspecified: Secondary | ICD-10-CM | POA: Diagnosis not present

## 2012-04-14 DIAGNOSIS — F329 Major depressive disorder, single episode, unspecified: Secondary | ICD-10-CM | POA: Diagnosis not present

## 2012-04-14 DIAGNOSIS — M159 Polyosteoarthritis, unspecified: Secondary | ICD-10-CM | POA: Diagnosis not present

## 2012-04-14 DIAGNOSIS — I2510 Atherosclerotic heart disease of native coronary artery without angina pectoris: Secondary | ICD-10-CM | POA: Diagnosis not present

## 2012-04-14 DIAGNOSIS — J45909 Unspecified asthma, uncomplicated: Secondary | ICD-10-CM | POA: Diagnosis not present

## 2012-04-16 LAB — RESPIRATORY CULTURE OR RESPIRATORY AND SPUTUM CULTURE

## 2012-04-18 DIAGNOSIS — J449 Chronic obstructive pulmonary disease, unspecified: Secondary | ICD-10-CM | POA: Diagnosis not present

## 2012-04-18 DIAGNOSIS — M159 Polyosteoarthritis, unspecified: Secondary | ICD-10-CM | POA: Diagnosis not present

## 2012-04-18 DIAGNOSIS — F329 Major depressive disorder, single episode, unspecified: Secondary | ICD-10-CM | POA: Diagnosis not present

## 2012-04-18 DIAGNOSIS — J45909 Unspecified asthma, uncomplicated: Secondary | ICD-10-CM | POA: Diagnosis not present

## 2012-04-18 DIAGNOSIS — I509 Heart failure, unspecified: Secondary | ICD-10-CM | POA: Diagnosis not present

## 2012-04-18 DIAGNOSIS — I2510 Atherosclerotic heart disease of native coronary artery without angina pectoris: Secondary | ICD-10-CM | POA: Diagnosis not present

## 2012-04-20 ENCOUNTER — Encounter: Payer: Self-pay | Admitting: Pulmonary Disease

## 2012-04-20 ENCOUNTER — Telehealth: Payer: Self-pay | Admitting: Pulmonary Disease

## 2012-04-20 ENCOUNTER — Ambulatory Visit (INDEPENDENT_AMBULATORY_CARE_PROVIDER_SITE_OTHER): Payer: Medicare Other | Admitting: Pulmonary Disease

## 2012-04-20 VITALS — BP 118/62 | HR 87 | Temp 98.0°F | Ht 67.0 in | Wt 133.4 lb

## 2012-04-20 DIAGNOSIS — J479 Bronchiectasis, uncomplicated: Secondary | ICD-10-CM

## 2012-04-20 DIAGNOSIS — Z23 Encounter for immunization: Secondary | ICD-10-CM | POA: Diagnosis not present

## 2012-04-20 MED ORDER — ALBUTEROL SULFATE HFA 108 (90 BASE) MCG/ACT IN AERS: 2.0000 | INHALATION_SPRAY | Freq: Four times a day (QID) | RESPIRATORY_TRACT | Status: DC | PRN

## 2012-04-20 NOTE — Telephone Encounter (Signed)
Pt states she is returning Lori's call.  Pt is concerned because she is scheduled for an appt today w/ KC @ 2:30 pm.   Antionette Fairy

## 2012-04-20 NOTE — Progress Notes (Signed)
  Subjective:    Patient ID: [name], female    DOB: 1927-06-04, 78 y.o.   MRN: [id number]  HPI Patient comes in today for followup of her known bronchiectasis.  She has been having issues with hemoptysis in the past, but most recently this has resolved.  She was finally able to get a sputum specimen to [place], and this grew a pansensitive Pseudomonas.  She currently has no significant chest congestion or sputum production, but is having a dry cough that is classic for an upper airway issue.  She describes worsening cough on lying down at night, a tickle in her throat, and is having postnasal drip as well.  She also has a history of reflux disease.  The patient is also clearing her throat frequently during our visit.   Review of Systems  Constitutional: Negative for fever and unexpected weight change.  HENT: Positive for congestion, sore throat, rhinorrhea, sneezing, postnasal drip and sinus pressure. Negative for ear pain, nosebleeds, trouble swallowing and dental problem.   Eyes: Negative for redness and itching.  Respiratory: Positive for cough, chest tightness, shortness of breath and wheezing.   Cardiovascular: Positive for palpitations and leg swelling.  Gastrointestinal: Positive for nausea ( with vertigo). Negative for vomiting.  Genitourinary: Negative for dysuria.  Musculoskeletal: Positive for joint swelling.  Skin: Negative for rash.  Neurological: Positive for headaches.  Hematological: Does not bruise/bleed easily.  Psychiatric/Behavioral: Negative for dysphoric mood. The patient is not nervous/anxious.        Objective:   Physical Exam Frail appearing female in no acute distress Nose without purulence or discharge noted Chest with a few scattered crackles, no wheezes or rhonchi Cardiac exam with regular rate and rhythm Lower extremities without edema, no cyanosis Alert and oriented, moves all 4 extremities.       Assessment & Plan:

## 2012-04-20 NOTE — Addendum Note (Signed)
Addended by: Ozella Almond R on: 04/20/2012 03:50 PM   Modules accepted: Orders

## 2012-04-20 NOTE — Assessment & Plan Note (Signed)
The patient is doing fairly well from a bronchiectasis standpoint.  Her hemoptysis has essentially resolved, and she is not having any congestion or significant quantity of purulent mucus.  We have isolated a pansensitive Pseudomonas from her sputum, and therefore need to keep this in mind when she does have an acute flare.  She is complaining of a cough today, but this is clearly upper airway in origin, and possibly related to postnasal drip or reflux.  She may ultimately need GI evaluation if she continues to have upper airway issues.

## 2012-04-20 NOTE — Telephone Encounter (Signed)
Pt informed that Lawson Fiscal was calling to review sputum culture results with her per Dr Shelle Iron.  Pt has appt this afternoon with Dr Shelle Iron for 6 mth f/u .  PT advised to keep this appt.

## 2012-04-20 NOTE — Patient Instructions (Addendum)
Try chlorpheniramine 4mg , 2 each night at bedtime to help with postnasal drip.  Get the pharmacist to help you if you cannot find it. Take prevacid everyday for possible acid reflux No throat clearing!  Use hard candy to help bathe back of throat. Will give you the flu shot today followup with me in 6mos.

## 2012-04-21 DIAGNOSIS — N390 Urinary tract infection, site not specified: Secondary | ICD-10-CM | POA: Diagnosis not present

## 2012-04-21 DIAGNOSIS — J449 Chronic obstructive pulmonary disease, unspecified: Secondary | ICD-10-CM | POA: Diagnosis not present

## 2012-04-21 DIAGNOSIS — J45909 Unspecified asthma, uncomplicated: Secondary | ICD-10-CM | POA: Diagnosis not present

## 2012-04-21 DIAGNOSIS — M159 Polyosteoarthritis, unspecified: Secondary | ICD-10-CM | POA: Diagnosis not present

## 2012-04-21 DIAGNOSIS — I2510 Atherosclerotic heart disease of native coronary artery without angina pectoris: Secondary | ICD-10-CM | POA: Diagnosis not present

## 2012-04-21 DIAGNOSIS — I509 Heart failure, unspecified: Secondary | ICD-10-CM | POA: Diagnosis not present

## 2012-04-21 DIAGNOSIS — F329 Major depressive disorder, single episode, unspecified: Secondary | ICD-10-CM | POA: Diagnosis not present

## 2012-04-25 DIAGNOSIS — M159 Polyosteoarthritis, unspecified: Secondary | ICD-10-CM | POA: Diagnosis not present

## 2012-04-25 DIAGNOSIS — I509 Heart failure, unspecified: Secondary | ICD-10-CM | POA: Diagnosis not present

## 2012-04-25 DIAGNOSIS — F329 Major depressive disorder, single episode, unspecified: Secondary | ICD-10-CM | POA: Diagnosis not present

## 2012-04-25 DIAGNOSIS — J449 Chronic obstructive pulmonary disease, unspecified: Secondary | ICD-10-CM | POA: Diagnosis not present

## 2012-04-25 DIAGNOSIS — I2510 Atherosclerotic heart disease of native coronary artery without angina pectoris: Secondary | ICD-10-CM | POA: Diagnosis not present

## 2012-04-25 DIAGNOSIS — J45909 Unspecified asthma, uncomplicated: Secondary | ICD-10-CM | POA: Diagnosis not present

## 2012-05-11 DIAGNOSIS — K6289 Other specified diseases of anus and rectum: Secondary | ICD-10-CM | POA: Diagnosis not present

## 2012-05-11 DIAGNOSIS — J449 Chronic obstructive pulmonary disease, unspecified: Secondary | ICD-10-CM | POA: Diagnosis not present

## 2012-05-11 DIAGNOSIS — M159 Polyosteoarthritis, unspecified: Secondary | ICD-10-CM | POA: Diagnosis not present

## 2012-05-11 DIAGNOSIS — J479 Bronchiectasis, uncomplicated: Secondary | ICD-10-CM | POA: Diagnosis not present

## 2012-05-11 DIAGNOSIS — E039 Hypothyroidism, unspecified: Secondary | ICD-10-CM | POA: Diagnosis not present

## 2012-05-11 DIAGNOSIS — R350 Frequency of micturition: Secondary | ICD-10-CM | POA: Diagnosis not present

## 2012-05-11 DIAGNOSIS — I2510 Atherosclerotic heart disease of native coronary artery without angina pectoris: Secondary | ICD-10-CM | POA: Diagnosis not present

## 2012-05-11 DIAGNOSIS — J45909 Unspecified asthma, uncomplicated: Secondary | ICD-10-CM | POA: Diagnosis not present

## 2012-05-11 DIAGNOSIS — F329 Major depressive disorder, single episode, unspecified: Secondary | ICD-10-CM | POA: Diagnosis not present

## 2012-05-11 DIAGNOSIS — I509 Heart failure, unspecified: Secondary | ICD-10-CM | POA: Diagnosis not present

## 2012-05-17 DIAGNOSIS — J45909 Unspecified asthma, uncomplicated: Secondary | ICD-10-CM | POA: Diagnosis not present

## 2012-05-17 DIAGNOSIS — I509 Heart failure, unspecified: Secondary | ICD-10-CM | POA: Diagnosis not present

## 2012-05-17 DIAGNOSIS — I2510 Atherosclerotic heart disease of native coronary artery without angina pectoris: Secondary | ICD-10-CM | POA: Diagnosis not present

## 2012-05-17 DIAGNOSIS — M159 Polyosteoarthritis, unspecified: Secondary | ICD-10-CM | POA: Diagnosis not present

## 2012-05-17 DIAGNOSIS — J449 Chronic obstructive pulmonary disease, unspecified: Secondary | ICD-10-CM | POA: Diagnosis not present

## 2012-05-17 DIAGNOSIS — F329 Major depressive disorder, single episode, unspecified: Secondary | ICD-10-CM | POA: Diagnosis not present

## 2012-05-19 DIAGNOSIS — I509 Heart failure, unspecified: Secondary | ICD-10-CM | POA: Diagnosis not present

## 2012-05-19 DIAGNOSIS — M159 Polyosteoarthritis, unspecified: Secondary | ICD-10-CM | POA: Diagnosis not present

## 2012-05-19 DIAGNOSIS — I2510 Atherosclerotic heart disease of native coronary artery without angina pectoris: Secondary | ICD-10-CM | POA: Diagnosis not present

## 2012-05-19 DIAGNOSIS — J449 Chronic obstructive pulmonary disease, unspecified: Secondary | ICD-10-CM | POA: Diagnosis not present

## 2012-05-19 DIAGNOSIS — J45909 Unspecified asthma, uncomplicated: Secondary | ICD-10-CM | POA: Diagnosis not present

## 2012-05-19 DIAGNOSIS — F329 Major depressive disorder, single episode, unspecified: Secondary | ICD-10-CM | POA: Diagnosis not present

## 2012-05-24 DIAGNOSIS — F329 Major depressive disorder, single episode, unspecified: Secondary | ICD-10-CM | POA: Diagnosis not present

## 2012-05-24 DIAGNOSIS — M159 Polyosteoarthritis, unspecified: Secondary | ICD-10-CM | POA: Diagnosis not present

## 2012-05-24 DIAGNOSIS — I509 Heart failure, unspecified: Secondary | ICD-10-CM | POA: Diagnosis not present

## 2012-05-24 DIAGNOSIS — J45909 Unspecified asthma, uncomplicated: Secondary | ICD-10-CM | POA: Diagnosis not present

## 2012-05-24 DIAGNOSIS — I2510 Atherosclerotic heart disease of native coronary artery without angina pectoris: Secondary | ICD-10-CM | POA: Diagnosis not present

## 2012-05-24 DIAGNOSIS — J449 Chronic obstructive pulmonary disease, unspecified: Secondary | ICD-10-CM | POA: Diagnosis not present

## 2012-05-31 DIAGNOSIS — I2510 Atherosclerotic heart disease of native coronary artery without angina pectoris: Secondary | ICD-10-CM | POA: Diagnosis not present

## 2012-05-31 DIAGNOSIS — M159 Polyosteoarthritis, unspecified: Secondary | ICD-10-CM | POA: Diagnosis not present

## 2012-05-31 DIAGNOSIS — J449 Chronic obstructive pulmonary disease, unspecified: Secondary | ICD-10-CM | POA: Diagnosis not present

## 2012-05-31 DIAGNOSIS — I509 Heart failure, unspecified: Secondary | ICD-10-CM | POA: Diagnosis not present

## 2012-05-31 DIAGNOSIS — F329 Major depressive disorder, single episode, unspecified: Secondary | ICD-10-CM | POA: Diagnosis not present

## 2012-05-31 DIAGNOSIS — J45909 Unspecified asthma, uncomplicated: Secondary | ICD-10-CM | POA: Diagnosis not present

## 2012-06-02 DIAGNOSIS — M159 Polyosteoarthritis, unspecified: Secondary | ICD-10-CM | POA: Diagnosis not present

## 2012-06-02 DIAGNOSIS — I2510 Atherosclerotic heart disease of native coronary artery without angina pectoris: Secondary | ICD-10-CM | POA: Diagnosis not present

## 2012-06-02 DIAGNOSIS — F329 Major depressive disorder, single episode, unspecified: Secondary | ICD-10-CM | POA: Diagnosis not present

## 2012-06-02 DIAGNOSIS — I509 Heart failure, unspecified: Secondary | ICD-10-CM | POA: Diagnosis not present

## 2012-06-02 DIAGNOSIS — J449 Chronic obstructive pulmonary disease, unspecified: Secondary | ICD-10-CM | POA: Diagnosis not present

## 2012-06-02 DIAGNOSIS — J45909 Unspecified asthma, uncomplicated: Secondary | ICD-10-CM | POA: Diagnosis not present

## 2012-06-07 DIAGNOSIS — M159 Polyosteoarthritis, unspecified: Secondary | ICD-10-CM | POA: Diagnosis not present

## 2012-06-07 DIAGNOSIS — F329 Major depressive disorder, single episode, unspecified: Secondary | ICD-10-CM | POA: Diagnosis not present

## 2012-06-07 DIAGNOSIS — I2510 Atherosclerotic heart disease of native coronary artery without angina pectoris: Secondary | ICD-10-CM | POA: Diagnosis not present

## 2012-06-07 DIAGNOSIS — J449 Chronic obstructive pulmonary disease, unspecified: Secondary | ICD-10-CM | POA: Diagnosis not present

## 2012-06-07 DIAGNOSIS — I509 Heart failure, unspecified: Secondary | ICD-10-CM | POA: Diagnosis not present

## 2012-06-07 DIAGNOSIS — J45909 Unspecified asthma, uncomplicated: Secondary | ICD-10-CM | POA: Diagnosis not present

## 2012-06-09 DIAGNOSIS — I2510 Atherosclerotic heart disease of native coronary artery without angina pectoris: Secondary | ICD-10-CM | POA: Diagnosis not present

## 2012-06-09 DIAGNOSIS — I509 Heart failure, unspecified: Secondary | ICD-10-CM | POA: Diagnosis not present

## 2012-06-09 DIAGNOSIS — J45909 Unspecified asthma, uncomplicated: Secondary | ICD-10-CM | POA: Diagnosis not present

## 2012-06-09 DIAGNOSIS — F329 Major depressive disorder, single episode, unspecified: Secondary | ICD-10-CM | POA: Diagnosis not present

## 2012-06-09 DIAGNOSIS — J449 Chronic obstructive pulmonary disease, unspecified: Secondary | ICD-10-CM | POA: Diagnosis not present

## 2012-06-09 DIAGNOSIS — M159 Polyosteoarthritis, unspecified: Secondary | ICD-10-CM | POA: Diagnosis not present

## 2012-06-10 DIAGNOSIS — J45909 Unspecified asthma, uncomplicated: Secondary | ICD-10-CM | POA: Diagnosis not present

## 2012-06-10 DIAGNOSIS — I509 Heart failure, unspecified: Secondary | ICD-10-CM | POA: Diagnosis not present

## 2012-06-10 DIAGNOSIS — D510 Vitamin B12 deficiency anemia due to intrinsic factor deficiency: Secondary | ICD-10-CM | POA: Diagnosis not present

## 2012-06-10 DIAGNOSIS — M159 Polyosteoarthritis, unspecified: Secondary | ICD-10-CM | POA: Diagnosis not present

## 2012-06-10 DIAGNOSIS — I2510 Atherosclerotic heart disease of native coronary artery without angina pectoris: Secondary | ICD-10-CM | POA: Diagnosis not present

## 2012-06-10 DIAGNOSIS — J449 Chronic obstructive pulmonary disease, unspecified: Secondary | ICD-10-CM | POA: Diagnosis not present

## 2012-06-26 DIAGNOSIS — T7840XA Allergy, unspecified, initial encounter: Secondary | ICD-10-CM | POA: Diagnosis not present

## 2012-06-26 DIAGNOSIS — J449 Chronic obstructive pulmonary disease, unspecified: Secondary | ICD-10-CM | POA: Diagnosis not present

## 2012-06-26 DIAGNOSIS — Z79899 Other long term (current) drug therapy: Secondary | ICD-10-CM | POA: Diagnosis not present

## 2012-07-12 DIAGNOSIS — J449 Chronic obstructive pulmonary disease, unspecified: Secondary | ICD-10-CM | POA: Diagnosis not present

## 2012-07-12 DIAGNOSIS — D510 Vitamin B12 deficiency anemia due to intrinsic factor deficiency: Secondary | ICD-10-CM | POA: Diagnosis not present

## 2012-07-12 DIAGNOSIS — I2510 Atherosclerotic heart disease of native coronary artery without angina pectoris: Secondary | ICD-10-CM | POA: Diagnosis not present

## 2012-07-12 DIAGNOSIS — J45909 Unspecified asthma, uncomplicated: Secondary | ICD-10-CM | POA: Diagnosis not present

## 2012-07-12 DIAGNOSIS — I509 Heart failure, unspecified: Secondary | ICD-10-CM | POA: Diagnosis not present

## 2012-07-12 DIAGNOSIS — M159 Polyosteoarthritis, unspecified: Secondary | ICD-10-CM | POA: Diagnosis not present

## 2012-07-19 DIAGNOSIS — M2040 Other hammer toe(s) (acquired), unspecified foot: Secondary | ICD-10-CM | POA: Diagnosis not present

## 2012-07-19 DIAGNOSIS — M779 Enthesopathy, unspecified: Secondary | ICD-10-CM | POA: Diagnosis not present

## 2012-08-05 DIAGNOSIS — J449 Chronic obstructive pulmonary disease, unspecified: Secondary | ICD-10-CM | POA: Diagnosis not present

## 2012-08-05 DIAGNOSIS — D510 Vitamin B12 deficiency anemia due to intrinsic factor deficiency: Secondary | ICD-10-CM | POA: Diagnosis not present

## 2012-08-05 DIAGNOSIS — I2510 Atherosclerotic heart disease of native coronary artery without angina pectoris: Secondary | ICD-10-CM | POA: Diagnosis not present

## 2012-08-05 DIAGNOSIS — I509 Heart failure, unspecified: Secondary | ICD-10-CM | POA: Diagnosis not present

## 2012-08-05 DIAGNOSIS — J45909 Unspecified asthma, uncomplicated: Secondary | ICD-10-CM | POA: Diagnosis not present

## 2012-08-05 DIAGNOSIS — M159 Polyosteoarthritis, unspecified: Secondary | ICD-10-CM | POA: Diagnosis not present

## 2012-08-09 DIAGNOSIS — D510 Vitamin B12 deficiency anemia due to intrinsic factor deficiency: Secondary | ICD-10-CM | POA: Diagnosis not present

## 2012-08-09 DIAGNOSIS — I2510 Atherosclerotic heart disease of native coronary artery without angina pectoris: Secondary | ICD-10-CM | POA: Diagnosis not present

## 2012-08-09 DIAGNOSIS — M159 Polyosteoarthritis, unspecified: Secondary | ICD-10-CM | POA: Diagnosis not present

## 2012-08-09 DIAGNOSIS — J45909 Unspecified asthma, uncomplicated: Secondary | ICD-10-CM | POA: Diagnosis not present

## 2012-08-09 DIAGNOSIS — J449 Chronic obstructive pulmonary disease, unspecified: Secondary | ICD-10-CM | POA: Diagnosis not present

## 2012-08-09 DIAGNOSIS — I509 Heart failure, unspecified: Secondary | ICD-10-CM | POA: Diagnosis not present

## 2012-08-16 DIAGNOSIS — M25519 Pain in unspecified shoulder: Secondary | ICD-10-CM | POA: Diagnosis not present

## 2012-08-22 DIAGNOSIS — E041 Nontoxic single thyroid nodule: Secondary | ICD-10-CM | POA: Diagnosis not present

## 2012-08-22 DIAGNOSIS — M810 Age-related osteoporosis without current pathological fracture: Secondary | ICD-10-CM | POA: Diagnosis not present

## 2012-08-22 DIAGNOSIS — J449 Chronic obstructive pulmonary disease, unspecified: Secondary | ICD-10-CM | POA: Diagnosis not present

## 2012-08-22 DIAGNOSIS — J479 Bronchiectasis, uncomplicated: Secondary | ICD-10-CM | POA: Diagnosis not present

## 2012-08-25 DIAGNOSIS — M1710 Unilateral primary osteoarthritis, unspecified knee: Secondary | ICD-10-CM | POA: Diagnosis not present

## 2012-09-02 DIAGNOSIS — M1710 Unilateral primary osteoarthritis, unspecified knee: Secondary | ICD-10-CM | POA: Diagnosis not present

## 2012-09-05 DIAGNOSIS — I2510 Atherosclerotic heart disease of native coronary artery without angina pectoris: Secondary | ICD-10-CM | POA: Diagnosis not present

## 2012-09-05 DIAGNOSIS — J45909 Unspecified asthma, uncomplicated: Secondary | ICD-10-CM | POA: Diagnosis not present

## 2012-09-05 DIAGNOSIS — I509 Heart failure, unspecified: Secondary | ICD-10-CM | POA: Diagnosis not present

## 2012-09-05 DIAGNOSIS — D510 Vitamin B12 deficiency anemia due to intrinsic factor deficiency: Secondary | ICD-10-CM | POA: Diagnosis not present

## 2012-09-05 DIAGNOSIS — J449 Chronic obstructive pulmonary disease, unspecified: Secondary | ICD-10-CM | POA: Diagnosis not present

## 2012-09-05 DIAGNOSIS — M159 Polyosteoarthritis, unspecified: Secondary | ICD-10-CM | POA: Diagnosis not present

## 2012-09-07 DIAGNOSIS — M1710 Unilateral primary osteoarthritis, unspecified knee: Secondary | ICD-10-CM | POA: Diagnosis not present

## 2012-09-13 DIAGNOSIS — M1710 Unilateral primary osteoarthritis, unspecified knee: Secondary | ICD-10-CM | POA: Diagnosis not present

## 2012-09-29 DIAGNOSIS — M2040 Other hammer toe(s) (acquired), unspecified foot: Secondary | ICD-10-CM | POA: Diagnosis not present

## 2012-10-03 DIAGNOSIS — J45909 Unspecified asthma, uncomplicated: Secondary | ICD-10-CM | POA: Diagnosis not present

## 2012-10-03 DIAGNOSIS — I509 Heart failure, unspecified: Secondary | ICD-10-CM | POA: Diagnosis not present

## 2012-10-06 DIAGNOSIS — J449 Chronic obstructive pulmonary disease, unspecified: Secondary | ICD-10-CM | POA: Diagnosis not present

## 2012-10-06 DIAGNOSIS — I519 Heart disease, unspecified: Secondary | ICD-10-CM | POA: Diagnosis not present

## 2012-10-06 DIAGNOSIS — Z883 Allergy status to other anti-infective agents status: Secondary | ICD-10-CM | POA: Diagnosis not present

## 2012-10-06 DIAGNOSIS — Z79899 Other long term (current) drug therapy: Secondary | ICD-10-CM | POA: Diagnosis not present

## 2012-10-06 DIAGNOSIS — IMO0001 Reserved for inherently not codable concepts without codable children: Secondary | ICD-10-CM | POA: Diagnosis not present

## 2012-10-06 DIAGNOSIS — M2040 Other hammer toe(s) (acquired), unspecified foot: Secondary | ICD-10-CM | POA: Diagnosis not present

## 2012-10-08 DIAGNOSIS — I509 Heart failure, unspecified: Secondary | ICD-10-CM | POA: Diagnosis not present

## 2012-10-08 DIAGNOSIS — M159 Polyosteoarthritis, unspecified: Secondary | ICD-10-CM | POA: Diagnosis not present

## 2012-10-08 DIAGNOSIS — J45909 Unspecified asthma, uncomplicated: Secondary | ICD-10-CM | POA: Diagnosis not present

## 2012-10-08 DIAGNOSIS — I2510 Atherosclerotic heart disease of native coronary artery without angina pectoris: Secondary | ICD-10-CM | POA: Diagnosis not present

## 2012-10-08 DIAGNOSIS — J449 Chronic obstructive pulmonary disease, unspecified: Secondary | ICD-10-CM | POA: Diagnosis not present

## 2012-10-19 ENCOUNTER — Ambulatory Visit: Payer: Medicare Other | Admitting: Pulmonary Disease

## 2012-10-26 DIAGNOSIS — Z961 Presence of intraocular lens: Secondary | ICD-10-CM | POA: Diagnosis not present

## 2012-10-26 DIAGNOSIS — H31019 Macula scars of posterior pole (postinflammatory) (post-traumatic), unspecified eye: Secondary | ICD-10-CM | POA: Diagnosis not present

## 2012-10-26 DIAGNOSIS — H04129 Dry eye syndrome of unspecified lacrimal gland: Secondary | ICD-10-CM | POA: Diagnosis not present

## 2012-10-26 DIAGNOSIS — H40019 Open angle with borderline findings, low risk, unspecified eye: Secondary | ICD-10-CM | POA: Diagnosis not present

## 2012-10-26 DIAGNOSIS — H353190 Nonexudative age-related macular degeneration, unspecified eye, stage unspecified: Secondary | ICD-10-CM | POA: Diagnosis not present

## 2012-11-01 DIAGNOSIS — H01009 Unspecified blepharitis unspecified eye, unspecified eyelid: Secondary | ICD-10-CM | POA: Diagnosis not present

## 2012-11-01 DIAGNOSIS — H04129 Dry eye syndrome of unspecified lacrimal gland: Secondary | ICD-10-CM | POA: Diagnosis not present

## 2012-11-01 DIAGNOSIS — H40019 Open angle with borderline findings, low risk, unspecified eye: Secondary | ICD-10-CM | POA: Diagnosis not present

## 2012-11-02 ENCOUNTER — Ambulatory Visit (INDEPENDENT_AMBULATORY_CARE_PROVIDER_SITE_OTHER): Payer: Medicare Other | Admitting: Pulmonary Disease

## 2012-11-02 ENCOUNTER — Encounter: Payer: Self-pay | Admitting: Pulmonary Disease

## 2012-11-02 VITALS — BP 94/62 | HR 69 | Temp 98.1°F | Ht 67.0 in | Wt 135.6 lb

## 2012-11-02 DIAGNOSIS — J479 Bronchiectasis, uncomplicated: Secondary | ICD-10-CM | POA: Diagnosis not present

## 2012-11-02 NOTE — Assessment & Plan Note (Signed)
The pt is stable from the last visit, with no acute exacerbations.  She feels her breathing is at baseline.  She is having a lot of allergy symptoms, but is working on a nasal hygiene regimen.

## 2012-11-02 NOTE — Progress Notes (Signed)
  Subjective:    Patient ID: [name], female    DOB: Nov 06, 1926, 79 y.o.   MRN: [id number]  HPI Patient comes in today for followup of her known bronchiectasis.  She has colonization with pansensitive Pseudomonas as well.  She has done well since her last visit, and has not had an acute flare of her bronchiectasis.  She has had some intermittent mild hemoptysis, but has been having a lot of blood in her nose from drying, along with postnasal drip.  I suspect most of this is from her upper airway.  She is taking an allergy medication over-the-counter, and doing occasional saline rinses.  She denies any chest congestion or worsening shortness of breath above her usual baseline.   Review of Systems  Constitutional: Negative for fever and unexpected weight change.  HENT: Positive for nosebleeds, rhinorrhea and sneezing. Negative for ear pain, congestion, sore throat, trouble swallowing, dental problem, postnasal drip and sinus pressure.        Itching in nose  Eyes: Negative for redness and itching.  Respiratory: Negative for cough, chest tightness, shortness of breath and wheezing.   Cardiovascular: Positive for palpitations. Negative for leg swelling.  Gastrointestinal: Positive for nausea. Negative for vomiting.  Genitourinary: Negative for dysuria.  Musculoskeletal: Positive for joint swelling and arthralgias.  Skin: Negative for rash.  Neurological: Positive for headaches.  Hematological: Does not bruise/bleed easily.  Psychiatric/Behavioral: Negative for dysphoric mood. The patient is not nervous/anxious.        Objective:   Physical Exam Frail-appearing female in no acute distress Nose without purulence, but crusted blood noted Oropharynx clear Neck without lymphadenopathy or thyromegaly Chest totally clear to auscultation today, no wheezing Cardiac exam with regular rate and rhythm Lower extremities without significant edema, no cyanosis Alert and oriented, moves all 4  extremities.       Assessment & Plan:

## 2012-11-02 NOTE — Patient Instructions (Addendum)
Stay on your allergy medication, and use saline nasal spray to keep your nose moist. Can use mucinex over the counter am and pm if needed for nasal and chest congestion. followup with me in 6mos

## 2012-11-03 DIAGNOSIS — J449 Chronic obstructive pulmonary disease, unspecified: Secondary | ICD-10-CM | POA: Diagnosis not present

## 2012-11-03 DIAGNOSIS — D510 Vitamin B12 deficiency anemia due to intrinsic factor deficiency: Secondary | ICD-10-CM | POA: Diagnosis not present

## 2012-11-03 DIAGNOSIS — J45909 Unspecified asthma, uncomplicated: Secondary | ICD-10-CM | POA: Diagnosis not present

## 2012-11-03 DIAGNOSIS — I509 Heart failure, unspecified: Secondary | ICD-10-CM | POA: Diagnosis not present

## 2012-11-03 DIAGNOSIS — I2510 Atherosclerotic heart disease of native coronary artery without angina pectoris: Secondary | ICD-10-CM | POA: Diagnosis not present

## 2012-11-03 DIAGNOSIS — M159 Polyosteoarthritis, unspecified: Secondary | ICD-10-CM | POA: Diagnosis not present

## 2012-11-17 DIAGNOSIS — R002 Palpitations: Secondary | ICD-10-CM | POA: Diagnosis not present

## 2012-11-17 DIAGNOSIS — R0989 Other specified symptoms and signs involving the circulatory and respiratory systems: Secondary | ICD-10-CM | POA: Diagnosis not present

## 2012-11-17 DIAGNOSIS — I359 Nonrheumatic aortic valve disorder, unspecified: Secondary | ICD-10-CM | POA: Diagnosis not present

## 2012-11-17 DIAGNOSIS — R079 Chest pain, unspecified: Secondary | ICD-10-CM | POA: Diagnosis not present

## 2012-11-23 DIAGNOSIS — R0609 Other forms of dyspnea: Secondary | ICD-10-CM | POA: Diagnosis not present

## 2012-12-02 DIAGNOSIS — J449 Chronic obstructive pulmonary disease, unspecified: Secondary | ICD-10-CM | POA: Diagnosis not present

## 2012-12-02 DIAGNOSIS — J45909 Unspecified asthma, uncomplicated: Secondary | ICD-10-CM | POA: Diagnosis not present

## 2012-12-02 DIAGNOSIS — D510 Vitamin B12 deficiency anemia due to intrinsic factor deficiency: Secondary | ICD-10-CM | POA: Diagnosis not present

## 2012-12-02 DIAGNOSIS — I509 Heart failure, unspecified: Secondary | ICD-10-CM | POA: Diagnosis not present

## 2012-12-02 DIAGNOSIS — I2510 Atherosclerotic heart disease of native coronary artery without angina pectoris: Secondary | ICD-10-CM | POA: Diagnosis not present

## 2012-12-02 DIAGNOSIS — M159 Polyosteoarthritis, unspecified: Secondary | ICD-10-CM | POA: Diagnosis not present

## 2012-12-07 DIAGNOSIS — M159 Polyosteoarthritis, unspecified: Secondary | ICD-10-CM | POA: Diagnosis not present

## 2012-12-07 DIAGNOSIS — I509 Heart failure, unspecified: Secondary | ICD-10-CM | POA: Diagnosis not present

## 2012-12-07 DIAGNOSIS — J45909 Unspecified asthma, uncomplicated: Secondary | ICD-10-CM | POA: Diagnosis not present

## 2012-12-07 DIAGNOSIS — J449 Chronic obstructive pulmonary disease, unspecified: Secondary | ICD-10-CM | POA: Diagnosis not present

## 2012-12-07 DIAGNOSIS — IMO0001 Reserved for inherently not codable concepts without codable children: Secondary | ICD-10-CM | POA: Diagnosis not present

## 2012-12-07 DIAGNOSIS — I2510 Atherosclerotic heart disease of native coronary artery without angina pectoris: Secondary | ICD-10-CM | POA: Diagnosis not present

## 2012-12-07 DIAGNOSIS — F329 Major depressive disorder, single episode, unspecified: Secondary | ICD-10-CM | POA: Diagnosis not present

## 2012-12-07 DIAGNOSIS — F411 Generalized anxiety disorder: Secondary | ICD-10-CM | POA: Diagnosis not present

## 2012-12-07 DIAGNOSIS — D510 Vitamin B12 deficiency anemia due to intrinsic factor deficiency: Secondary | ICD-10-CM | POA: Diagnosis not present

## 2013-01-03 DIAGNOSIS — M159 Polyosteoarthritis, unspecified: Secondary | ICD-10-CM | POA: Diagnosis not present

## 2013-01-03 DIAGNOSIS — J45909 Unspecified asthma, uncomplicated: Secondary | ICD-10-CM | POA: Diagnosis not present

## 2013-01-03 DIAGNOSIS — I2510 Atherosclerotic heart disease of native coronary artery without angina pectoris: Secondary | ICD-10-CM | POA: Diagnosis not present

## 2013-01-03 DIAGNOSIS — J449 Chronic obstructive pulmonary disease, unspecified: Secondary | ICD-10-CM | POA: Diagnosis not present

## 2013-01-03 DIAGNOSIS — I509 Heart failure, unspecified: Secondary | ICD-10-CM | POA: Diagnosis not present

## 2013-01-03 DIAGNOSIS — D510 Vitamin B12 deficiency anemia due to intrinsic factor deficiency: Secondary | ICD-10-CM | POA: Diagnosis not present

## 2013-01-25 DIAGNOSIS — E041 Nontoxic single thyroid nodule: Secondary | ICD-10-CM | POA: Diagnosis not present

## 2013-01-25 DIAGNOSIS — E538 Deficiency of other specified B group vitamins: Secondary | ICD-10-CM | POA: Diagnosis not present

## 2013-01-25 DIAGNOSIS — J479 Bronchiectasis, uncomplicated: Secondary | ICD-10-CM | POA: Diagnosis not present

## 2013-01-25 DIAGNOSIS — IMO0001 Reserved for inherently not codable concepts without codable children: Secondary | ICD-10-CM | POA: Diagnosis not present

## 2013-01-25 DIAGNOSIS — J449 Chronic obstructive pulmonary disease, unspecified: Secondary | ICD-10-CM | POA: Diagnosis not present

## 2013-01-25 DIAGNOSIS — E785 Hyperlipidemia, unspecified: Secondary | ICD-10-CM | POA: Diagnosis not present

## 2013-01-25 DIAGNOSIS — M810 Age-related osteoporosis without current pathological fracture: Secondary | ICD-10-CM | POA: Diagnosis not present

## 2013-01-25 DIAGNOSIS — R109 Unspecified abdominal pain: Secondary | ICD-10-CM | POA: Diagnosis not present

## 2013-02-02 DIAGNOSIS — R609 Edema, unspecified: Secondary | ICD-10-CM | POA: Diagnosis not present

## 2013-02-02 DIAGNOSIS — M79609 Pain in unspecified limb: Secondary | ICD-10-CM | POA: Diagnosis not present

## 2013-02-02 DIAGNOSIS — I2510 Atherosclerotic heart disease of native coronary artery without angina pectoris: Secondary | ICD-10-CM | POA: Diagnosis not present

## 2013-02-02 DIAGNOSIS — L02619 Cutaneous abscess of unspecified foot: Secondary | ICD-10-CM | POA: Diagnosis not present

## 2013-02-02 DIAGNOSIS — L03119 Cellulitis of unspecified part of limb: Secondary | ICD-10-CM | POA: Diagnosis not present

## 2013-02-02 DIAGNOSIS — D510 Vitamin B12 deficiency anemia due to intrinsic factor deficiency: Secondary | ICD-10-CM | POA: Diagnosis not present

## 2013-02-02 DIAGNOSIS — M159 Polyosteoarthritis, unspecified: Secondary | ICD-10-CM | POA: Diagnosis not present

## 2013-02-02 DIAGNOSIS — I509 Heart failure, unspecified: Secondary | ICD-10-CM | POA: Diagnosis not present

## 2013-02-02 DIAGNOSIS — J449 Chronic obstructive pulmonary disease, unspecified: Secondary | ICD-10-CM | POA: Diagnosis not present

## 2013-02-02 DIAGNOSIS — J45909 Unspecified asthma, uncomplicated: Secondary | ICD-10-CM | POA: Diagnosis not present

## 2013-02-05 DIAGNOSIS — IMO0001 Reserved for inherently not codable concepts without codable children: Secondary | ICD-10-CM | POA: Diagnosis not present

## 2013-02-05 DIAGNOSIS — I509 Heart failure, unspecified: Secondary | ICD-10-CM | POA: Diagnosis not present

## 2013-02-05 DIAGNOSIS — F411 Generalized anxiety disorder: Secondary | ICD-10-CM | POA: Diagnosis not present

## 2013-02-05 DIAGNOSIS — F329 Major depressive disorder, single episode, unspecified: Secondary | ICD-10-CM | POA: Diagnosis not present

## 2013-02-05 DIAGNOSIS — J449 Chronic obstructive pulmonary disease, unspecified: Secondary | ICD-10-CM | POA: Diagnosis not present

## 2013-02-05 DIAGNOSIS — D510 Vitamin B12 deficiency anemia due to intrinsic factor deficiency: Secondary | ICD-10-CM | POA: Diagnosis not present

## 2013-02-05 DIAGNOSIS — J45909 Unspecified asthma, uncomplicated: Secondary | ICD-10-CM | POA: Diagnosis not present

## 2013-02-05 DIAGNOSIS — I2510 Atherosclerotic heart disease of native coronary artery without angina pectoris: Secondary | ICD-10-CM | POA: Diagnosis not present

## 2013-02-05 DIAGNOSIS — M159 Polyosteoarthritis, unspecified: Secondary | ICD-10-CM | POA: Diagnosis not present

## 2013-03-06 DIAGNOSIS — J45909 Unspecified asthma, uncomplicated: Secondary | ICD-10-CM | POA: Diagnosis not present

## 2013-03-06 DIAGNOSIS — I2510 Atherosclerotic heart disease of native coronary artery without angina pectoris: Secondary | ICD-10-CM | POA: Diagnosis not present

## 2013-03-06 DIAGNOSIS — I509 Heart failure, unspecified: Secondary | ICD-10-CM | POA: Diagnosis not present

## 2013-03-06 DIAGNOSIS — D510 Vitamin B12 deficiency anemia due to intrinsic factor deficiency: Secondary | ICD-10-CM | POA: Diagnosis not present

## 2013-03-06 DIAGNOSIS — J449 Chronic obstructive pulmonary disease, unspecified: Secondary | ICD-10-CM | POA: Diagnosis not present

## 2013-03-06 DIAGNOSIS — M159 Polyosteoarthritis, unspecified: Secondary | ICD-10-CM | POA: Diagnosis not present

## 2013-03-09 DIAGNOSIS — E785 Hyperlipidemia, unspecified: Secondary | ICD-10-CM | POA: Diagnosis not present

## 2013-04-03 DIAGNOSIS — D510 Vitamin B12 deficiency anemia due to intrinsic factor deficiency: Secondary | ICD-10-CM | POA: Diagnosis not present

## 2013-04-03 DIAGNOSIS — M159 Polyosteoarthritis, unspecified: Secondary | ICD-10-CM | POA: Diagnosis not present

## 2013-04-03 DIAGNOSIS — I509 Heart failure, unspecified: Secondary | ICD-10-CM | POA: Diagnosis not present

## 2013-04-03 DIAGNOSIS — I2510 Atherosclerotic heart disease of native coronary artery without angina pectoris: Secondary | ICD-10-CM | POA: Diagnosis not present

## 2013-04-03 DIAGNOSIS — J45909 Unspecified asthma, uncomplicated: Secondary | ICD-10-CM | POA: Diagnosis not present

## 2013-04-03 DIAGNOSIS — J449 Chronic obstructive pulmonary disease, unspecified: Secondary | ICD-10-CM | POA: Diagnosis not present

## 2013-04-05 DIAGNOSIS — N760 Acute vaginitis: Secondary | ICD-10-CM | POA: Diagnosis not present

## 2013-04-05 DIAGNOSIS — N318 Other neuromuscular dysfunction of bladder: Secondary | ICD-10-CM | POA: Diagnosis not present

## 2013-04-06 DIAGNOSIS — J45909 Unspecified asthma, uncomplicated: Secondary | ICD-10-CM | POA: Diagnosis not present

## 2013-04-06 DIAGNOSIS — F411 Generalized anxiety disorder: Secondary | ICD-10-CM | POA: Diagnosis not present

## 2013-04-06 DIAGNOSIS — F329 Major depressive disorder, single episode, unspecified: Secondary | ICD-10-CM | POA: Diagnosis not present

## 2013-04-06 DIAGNOSIS — M159 Polyosteoarthritis, unspecified: Secondary | ICD-10-CM | POA: Diagnosis not present

## 2013-04-06 DIAGNOSIS — I2510 Atherosclerotic heart disease of native coronary artery without angina pectoris: Secondary | ICD-10-CM | POA: Diagnosis not present

## 2013-04-06 DIAGNOSIS — J449 Chronic obstructive pulmonary disease, unspecified: Secondary | ICD-10-CM | POA: Diagnosis not present

## 2013-04-06 DIAGNOSIS — IMO0001 Reserved for inherently not codable concepts without codable children: Secondary | ICD-10-CM | POA: Diagnosis not present

## 2013-04-06 DIAGNOSIS — D510 Vitamin B12 deficiency anemia due to intrinsic factor deficiency: Secondary | ICD-10-CM | POA: Diagnosis not present

## 2013-04-06 DIAGNOSIS — I509 Heart failure, unspecified: Secondary | ICD-10-CM | POA: Diagnosis not present

## 2013-04-10 ENCOUNTER — Encounter: Payer: Self-pay | Admitting: Gynecology

## 2013-04-10 ENCOUNTER — Ambulatory Visit (INDEPENDENT_AMBULATORY_CARE_PROVIDER_SITE_OTHER): Payer: Medicare Other | Admitting: Gynecology

## 2013-04-10 VITALS — BP 120/78 | Ht 64.5 in | Wt 128.0 lb

## 2013-04-10 DIAGNOSIS — N949 Unspecified condition associated with female genital organs and menstrual cycle: Secondary | ICD-10-CM | POA: Diagnosis not present

## 2013-04-10 DIAGNOSIS — R102 Pelvic and perineal pain: Secondary | ICD-10-CM

## 2013-04-10 DIAGNOSIS — N952 Postmenopausal atrophic vaginitis: Secondary | ICD-10-CM | POA: Diagnosis not present

## 2013-04-10 DIAGNOSIS — M810 Age-related osteoporosis without current pathological fracture: Secondary | ICD-10-CM | POA: Diagnosis not present

## 2013-04-10 MED ORDER — NONFORMULARY OR COMPOUNDED ITEM: Status: DC

## 2013-04-10 NOTE — Progress Notes (Signed)
[name] [birth year] [id number]   History:    79 y.o. new patient to the practice that was refer to his by her internist at [facility] as a result of questionable redness in the vulvar area and vague low abdominal discomfort. Patient denied any vaginal bleeding. She states that her internist is been doing her Pap smears and she always had normal Pap smears the last one over 3 years ago. Patient would normal mammograms her last over 10 years ago. Last colonoscopy normal over 10 years ago. Patient is being followed by her pulmonologist for her bronchiectasis. Her cardiologist is Dr. Duke Salvia and her internist is Dr. Evlyn Kanner. Patient is not a clear historian and we have no copy of her medical records because her primary physician is on a different electronic medical records then we have. Patient denies any past history of cancer. Patient with questionable osteoporosis history in the past. She is taking vitamin D3 1000 units daily along withTums 2 tablets daily as well.   Past medical history,surgical history, family history and social history were all reviewed and documented in the EPIC chart.  Gynecologic History No LMP recorded. Patient is postmenopausal. Contraception: post menopausal status Last Pap: over 5 years ago. Results were: normal Last mammogram: over 10 years ago. Results were: normal  Obstetric History OB History  Gravida Para Term Preterm AB SAB TAB Ectopic Multiple Living  1 1        1     # Outcome Date GA Lbr Len/2nd Weight Sex Delivery Anes PTL Lv  1 PAR                ROS: A ROS was performed and pertinent positives and negatives are included in the history.  GENERAL: No fevers or chills. HEENT: No change in vision, no earache, sore throat or sinus congestion. NECK: No pain or stiffness. CARDIOVASCULAR: No chest pain or pressure. No palpitations. PULMONARY: No shortness of breath, cough or wheeze. GASTROINTESTINAL: No abdominal pain, nausea, vomiting or  diarrhea, melena or bright red blood per rectum. GENITOURINARY: No urinary frequency, urgency, hesitancy or dysuria. MUSCULOSKELETAL: No joint or muscle pain, no back pain, no recent trauma. DERMATOLOGIC: No rash, no itching, no lesions. ENDOCRINE: No polyuria, polydipsia, no heat or cold intolerance. No recent change in weight. HEMATOLOGICAL: No anemia or easy bruising or bleeding. NEUROLOGIC: No headache, seizures, numbness, tingling or weakness. PSYCHIATRIC: No depression, no loss of interest in normal activity or change in sleep pattern.     Exam: chaperone present  BP 120/78  Ht 5' 4.5" (1.638 m)  Wt 128 lb (58.06 kg)  BMI 21.64 kg/m2  Body mass index is 21.64 kg/(m^2).  General appearance : Well developed well nourished female. No acute distress HEENT: Neck supple, trachea midline, no carotid bruits, no thyroidmegaly Lungs: Clear to auscultation, no rhonchi or wheezes, or rib retractions  Heart: Regular rate and rhythm, no murmurs or gallops Breast:Examined in sitting and supine position were symmetrical in appearance, no palpable masses or tenderness,  no skin retraction, no nipple inversion, no nipple discharge, no skin discoloration, no axillary or supraclavicular lymphadenopathy Abdomen: no palpable masses or tenderness, no rebound or guarding Extremities: no edema or skin discoloration or tenderness  Pelvic:  Bartholin, Urethra, Skene Glands: Within normal limits             Vagina: No gross lesions or discharge,atrophic changes  Cervix: No gross lesions or discharge  Uterus  axial, normal size, shape and consistency, non-tender and mobile  Adnexa  Without masses or tenderness  Anus and perineum  normal   Rectovaginal  normal sphincter tone without palpated masses or tenderness             Hemoccult PCP provided     Assessment/Plan:  79 y.o. female with vaginal atrophy and discomfort and irritation. There were no leukoplakic lesions on external exam. Pelvic exam with the  exception of vaginal actually was otherwise unremarkable. We discussed placing patient on estrogen vaginal cream such as estradiol 0.02% twice a week. Risks benefits and pros and cons were discussed. Due to her being over the age of 80 the risk of colonoscopy outweighs the benefit. She is submitting the Hemoccult cards to her provider. She will follow up with her PCP and other providers for additional blood work and treatment. We will try to obtain the results of her last bone density study.    Ok Edwards MD, 2:57 PM 04/10/2013

## 2013-04-10 NOTE — Patient Instructions (Addendum)

## 2013-05-04 DIAGNOSIS — I509 Heart failure, unspecified: Secondary | ICD-10-CM | POA: Diagnosis not present

## 2013-05-04 DIAGNOSIS — M159 Polyosteoarthritis, unspecified: Secondary | ICD-10-CM | POA: Diagnosis not present

## 2013-05-04 DIAGNOSIS — J449 Chronic obstructive pulmonary disease, unspecified: Secondary | ICD-10-CM | POA: Diagnosis not present

## 2013-05-04 DIAGNOSIS — D510 Vitamin B12 deficiency anemia due to intrinsic factor deficiency: Secondary | ICD-10-CM | POA: Diagnosis not present

## 2013-05-04 DIAGNOSIS — J45909 Unspecified asthma, uncomplicated: Secondary | ICD-10-CM | POA: Diagnosis not present

## 2013-05-04 DIAGNOSIS — M75120 Complete rotator cuff tear or rupture of unspecified shoulder, not specified as traumatic: Secondary | ICD-10-CM | POA: Diagnosis not present

## 2013-05-04 DIAGNOSIS — I2510 Atherosclerotic heart disease of native coronary artery without angina pectoris: Secondary | ICD-10-CM | POA: Diagnosis not present

## 2013-05-09 ENCOUNTER — Encounter: Payer: Self-pay | Admitting: Pulmonary Disease

## 2013-05-09 ENCOUNTER — Ambulatory Visit (INDEPENDENT_AMBULATORY_CARE_PROVIDER_SITE_OTHER): Payer: Medicare Other | Admitting: Pulmonary Disease

## 2013-05-09 VITALS — BP 142/80 | HR 62 | Temp 98.4°F | Ht 67.0 in | Wt 129.2 lb

## 2013-05-09 DIAGNOSIS — J479 Bronchiectasis, uncomplicated: Secondary | ICD-10-CM

## 2013-05-09 NOTE — Progress Notes (Signed)
  Subjective:    Patient ID: [name], female    DOB: 1926-10-16, 79 y.o.   MRN: [id number]  HPI The patient comes in today for followup of her known bronchiectasis with no significant obstructive lung disease.  She has not had an acute exacerbation since last visit, and has not had any significant hemoptysis.  She feels that her breathing is about the same, and does use albuterol as needed, most commonly in the mornings.   Review of Systems  Constitutional: Negative for fever and unexpected weight change.  HENT: Negative for ear pain, nosebleeds, congestion, sore throat, rhinorrhea, sneezing, trouble swallowing, dental problem, postnasal drip and sinus pressure.   Eyes: Negative for redness and itching.  Respiratory: Negative for cough, chest tightness, shortness of breath and wheezing.   Cardiovascular: Negative for palpitations and leg swelling.  Gastrointestinal: Negative for nausea and vomiting.  Genitourinary: Negative for dysuria.  Musculoskeletal: Negative for joint swelling.  Skin: Negative for rash.  Neurological: Negative for headaches.  Hematological: Does not bruise/bleed easily.  Psychiatric/Behavioral: Negative for dysphoric mood. The patient is not nervous/anxious.        Objective:   Physical Exam Frail-appearing female in no acute distress Nose without purulence or discharge noted Neck without lymphadenopathy or thyromegaly Chest with a few scattered crackles, no wheezing Cardiac exam with regular rate and rhythm Lower extremities minimal ankle edema, no cyanosis Alert and oriented, moves all 4 extremities.       Assessment & Plan:

## 2013-05-09 NOTE — Assessment & Plan Note (Signed)
The patient appears to be stable from a bronchiectasis standpoint.  She has not had an acute exacerbation since last visit, and has no significant hemoptysis.  I have asked her to work as hard as she can on her endurance and conditioning, and to use her albuterol as needed.

## 2013-05-09 NOTE — Patient Instructions (Addendum)
Continue albuterol as needed. Stay as active as possible followup with me in 6mos.

## 2013-05-11 DIAGNOSIS — Z23 Encounter for immunization: Secondary | ICD-10-CM | POA: Diagnosis not present

## 2013-05-16 ENCOUNTER — Telehealth: Payer: Self-pay | Admitting: Pulmonary Disease

## 2013-05-16 DIAGNOSIS — I509 Heart failure, unspecified: Secondary | ICD-10-CM | POA: Diagnosis not present

## 2013-05-16 DIAGNOSIS — J449 Chronic obstructive pulmonary disease, unspecified: Secondary | ICD-10-CM | POA: Diagnosis not present

## 2013-05-16 DIAGNOSIS — I2510 Atherosclerotic heart disease of native coronary artery without angina pectoris: Secondary | ICD-10-CM | POA: Diagnosis not present

## 2013-05-16 DIAGNOSIS — D510 Vitamin B12 deficiency anemia due to intrinsic factor deficiency: Secondary | ICD-10-CM | POA: Diagnosis not present

## 2013-05-16 DIAGNOSIS — M159 Polyosteoarthritis, unspecified: Secondary | ICD-10-CM | POA: Diagnosis not present

## 2013-05-16 DIAGNOSIS — J45909 Unspecified asthma, uncomplicated: Secondary | ICD-10-CM | POA: Diagnosis not present

## 2013-05-16 NOTE — Telephone Encounter (Signed)
Called, spoke with pt.  Verified she received flu vaccination on Friday, May 12, 2013 at [facility] in [place].  I have entered this in pt's immunization profile in chart.  Pt aware and would like to ensure [facility] knows about this.  Will sign off an route to [facility] as FYI.

## 2013-05-30 DIAGNOSIS — E538 Deficiency of other specified B group vitamins: Secondary | ICD-10-CM | POA: Diagnosis not present

## 2013-05-30 DIAGNOSIS — J449 Chronic obstructive pulmonary disease, unspecified: Secondary | ICD-10-CM | POA: Diagnosis not present

## 2013-05-30 DIAGNOSIS — E039 Hypothyroidism, unspecified: Secondary | ICD-10-CM | POA: Diagnosis not present

## 2013-05-30 DIAGNOSIS — J479 Bronchiectasis, uncomplicated: Secondary | ICD-10-CM | POA: Diagnosis not present

## 2013-05-30 DIAGNOSIS — M810 Age-related osteoporosis without current pathological fracture: Secondary | ICD-10-CM | POA: Diagnosis not present

## 2013-05-30 DIAGNOSIS — E785 Hyperlipidemia, unspecified: Secondary | ICD-10-CM | POA: Diagnosis not present

## 2013-05-30 DIAGNOSIS — Z1331 Encounter for screening for depression: Secondary | ICD-10-CM | POA: Diagnosis not present

## 2013-05-30 DIAGNOSIS — F341 Dysthymic disorder: Secondary | ICD-10-CM | POA: Diagnosis not present

## 2013-05-30 DIAGNOSIS — R109 Unspecified abdominal pain: Secondary | ICD-10-CM | POA: Diagnosis not present

## 2013-05-30 DIAGNOSIS — K5900 Constipation, unspecified: Secondary | ICD-10-CM | POA: Diagnosis not present

## 2013-05-30 DIAGNOSIS — N318 Other neuromuscular dysfunction of bladder: Secondary | ICD-10-CM | POA: Diagnosis not present

## 2013-06-02 DIAGNOSIS — J45909 Unspecified asthma, uncomplicated: Secondary | ICD-10-CM | POA: Diagnosis not present

## 2013-06-02 DIAGNOSIS — J449 Chronic obstructive pulmonary disease, unspecified: Secondary | ICD-10-CM | POA: Diagnosis not present

## 2013-06-02 DIAGNOSIS — I2510 Atherosclerotic heart disease of native coronary artery without angina pectoris: Secondary | ICD-10-CM | POA: Diagnosis not present

## 2013-06-02 DIAGNOSIS — I509 Heart failure, unspecified: Secondary | ICD-10-CM | POA: Diagnosis not present

## 2013-06-02 DIAGNOSIS — M159 Polyosteoarthritis, unspecified: Secondary | ICD-10-CM | POA: Diagnosis not present

## 2013-06-02 DIAGNOSIS — D510 Vitamin B12 deficiency anemia due to intrinsic factor deficiency: Secondary | ICD-10-CM | POA: Diagnosis not present

## 2013-06-12 DIAGNOSIS — E538 Deficiency of other specified B group vitamins: Secondary | ICD-10-CM | POA: Diagnosis not present

## 2013-07-04 DIAGNOSIS — E538 Deficiency of other specified B group vitamins: Secondary | ICD-10-CM | POA: Diagnosis not present

## 2013-07-17 DIAGNOSIS — H01009 Unspecified blepharitis unspecified eye, unspecified eyelid: Secondary | ICD-10-CM | POA: Diagnosis not present

## 2013-07-17 DIAGNOSIS — H35039 Hypertensive retinopathy, unspecified eye: Secondary | ICD-10-CM | POA: Diagnosis not present

## 2013-07-17 DIAGNOSIS — H04129 Dry eye syndrome of unspecified lacrimal gland: Secondary | ICD-10-CM | POA: Diagnosis not present

## 2013-07-17 DIAGNOSIS — H40019 Open angle with borderline findings, low risk, unspecified eye: Secondary | ICD-10-CM | POA: Diagnosis not present

## 2013-07-17 DIAGNOSIS — H2510 Age-related nuclear cataract, unspecified eye: Secondary | ICD-10-CM | POA: Diagnosis not present

## 2013-07-17 DIAGNOSIS — H35379 Puckering of macula, unspecified eye: Secondary | ICD-10-CM | POA: Diagnosis not present

## 2013-07-17 DIAGNOSIS — H16229 Keratoconjunctivitis sicca, not specified as Sjogren's, unspecified eye: Secondary | ICD-10-CM | POA: Diagnosis not present

## 2013-08-22 DIAGNOSIS — E538 Deficiency of other specified B group vitamins: Secondary | ICD-10-CM | POA: Diagnosis not present

## 2013-10-04 DIAGNOSIS — E538 Deficiency of other specified B group vitamins: Secondary | ICD-10-CM | POA: Diagnosis not present

## 2013-10-17 DIAGNOSIS — M75120 Complete rotator cuff tear or rupture of unspecified shoulder, not specified as traumatic: Secondary | ICD-10-CM | POA: Diagnosis not present

## 2013-10-25 DIAGNOSIS — F341 Dysthymic disorder: Secondary | ICD-10-CM | POA: Diagnosis not present

## 2013-10-25 DIAGNOSIS — J479 Bronchiectasis, uncomplicated: Secondary | ICD-10-CM | POA: Diagnosis not present

## 2013-10-25 DIAGNOSIS — IMO0001 Reserved for inherently not codable concepts without codable children: Secondary | ICD-10-CM | POA: Diagnosis not present

## 2013-10-25 DIAGNOSIS — R350 Frequency of micturition: Secondary | ICD-10-CM | POA: Diagnosis not present

## 2013-10-25 DIAGNOSIS — J449 Chronic obstructive pulmonary disease, unspecified: Secondary | ICD-10-CM | POA: Diagnosis not present

## 2013-10-25 DIAGNOSIS — E538 Deficiency of other specified B group vitamins: Secondary | ICD-10-CM | POA: Diagnosis not present

## 2013-10-25 DIAGNOSIS — E039 Hypothyroidism, unspecified: Secondary | ICD-10-CM | POA: Diagnosis not present

## 2013-10-25 DIAGNOSIS — I2510 Atherosclerotic heart disease of native coronary artery without angina pectoris: Secondary | ICD-10-CM | POA: Diagnosis not present

## 2013-11-07 ENCOUNTER — Ambulatory Visit (INDEPENDENT_AMBULATORY_CARE_PROVIDER_SITE_OTHER): Payer: Medicare Other | Admitting: Pulmonary Disease

## 2013-11-07 ENCOUNTER — Encounter: Payer: Self-pay | Admitting: Pulmonary Disease

## 2013-11-07 VITALS — BP 116/70 | HR 87 | Temp 98.2°F | Ht 67.0 in | Wt 133.0 lb

## 2013-11-07 DIAGNOSIS — J479 Bronchiectasis, uncomplicated: Secondary | ICD-10-CM | POA: Diagnosis not present

## 2013-11-07 NOTE — Assessment & Plan Note (Signed)
It appears the patient is near her usual chronic baseline with respect to quantity of mucus. There is nothing to suggest an acute exacerbation at this time. I have asked her to monitor her sputum quantity, and also to get back on her flutter bowel to help with mobilization. She is using her albuterol as needed and not excessively currently. However, I asked her to call us if she is needing multiple doses on a daily basis.

## 2013-11-07 NOTE — Progress Notes (Signed)
   Subjective:    Patient ID: [name], female    DOB: [birth year], 80 y.o.   MRN: [id number]  HPI The patient comes in today for followup of her known bronchiectasis. She continues to produce purulent mucus at times, but she does not feel it is increased in quantity nor does it appear more purulent.  She has breathing difficulties at times, and will use her rescue inhaler which appears to help. She does not require it more than once a day. We have talked in the past about a LABA. She also has soreness at times with movement, as well as with cough.   Review of Systems  Constitutional: Negative for fever and unexpected weight change.  HENT: Negative for congestion, dental problem, ear pain, nosebleeds, postnasal drip, rhinorrhea, sinus pressure, sneezing, sore throat and trouble swallowing.   Eyes: Negative for redness and itching.  Respiratory: Positive for cough and shortness of breath. Negative for chest tightness and wheezing.        Cramping in lungs//Yellow congestion  Cardiovascular: Negative for palpitations and leg swelling.  Gastrointestinal: Negative for nausea and vomiting.  Genitourinary: Negative for dysuria.  Musculoskeletal: Positive for back pain ( right mid/upper back). Negative for joint swelling.  Skin: Negative for rash.  Neurological: Negative for headaches.  Hematological: Does not bruise/bleed easily.  Psychiatric/Behavioral: Negative for dysphoric mood. The patient is not nervous/anxious.        Objective:   Physical Exam Frail-appearing female in no acute distress Nose without purulence or discharge noted Neck without lymphadenopathy or thyromegaly Chest with a few scattered crackles, no wheezing Cardiac exam with regular rate and rhythm Lower extremities without significant edema, no cyanosis Alert and oriented, moves all 4 extremities.       Assessment & Plan:

## 2013-11-07 NOTE — Patient Instructions (Signed)
Continue on albuterol as needed.  However, if you find that you are requiring the inhaler multiple times a day everyday, let us know.  Monitor your mucus, and let us know if increased quantity.  Use your flutter valve 5 breaths am and pm everyday.  followup with me again in 75mos.

## 2013-11-14 DIAGNOSIS — E538 Deficiency of other specified B group vitamins: Secondary | ICD-10-CM | POA: Diagnosis not present

## 2013-12-14 DIAGNOSIS — E538 Deficiency of other specified B group vitamins: Secondary | ICD-10-CM | POA: Diagnosis not present

## 2013-12-22 DIAGNOSIS — F411 Generalized anxiety disorder: Secondary | ICD-10-CM | POA: Diagnosis not present

## 2013-12-22 DIAGNOSIS — I359 Nonrheumatic aortic valve disorder, unspecified: Secondary | ICD-10-CM | POA: Diagnosis not present

## 2013-12-22 DIAGNOSIS — R0989 Other specified symptoms and signs involving the circulatory and respiratory systems: Secondary | ICD-10-CM | POA: Diagnosis not present

## 2013-12-22 DIAGNOSIS — R0609 Other forms of dyspnea: Secondary | ICD-10-CM | POA: Diagnosis not present

## 2013-12-22 DIAGNOSIS — R079 Chest pain, unspecified: Secondary | ICD-10-CM | POA: Diagnosis not present

## 2013-12-22 DIAGNOSIS — IMO0001 Reserved for inherently not codable concepts without codable children: Secondary | ICD-10-CM | POA: Diagnosis not present

## 2013-12-27 DIAGNOSIS — M159 Polyosteoarthritis, unspecified: Secondary | ICD-10-CM | POA: Diagnosis not present

## 2014-01-09 DIAGNOSIS — J449 Chronic obstructive pulmonary disease, unspecified: Secondary | ICD-10-CM | POA: Diagnosis not present

## 2014-01-09 DIAGNOSIS — S70929A Unspecified superficial injury of unspecified thigh, initial encounter: Secondary | ICD-10-CM | POA: Diagnosis not present

## 2014-01-09 DIAGNOSIS — S81009A Unspecified open wound, unspecified knee, initial encounter: Secondary | ICD-10-CM | POA: Diagnosis not present

## 2014-01-09 DIAGNOSIS — S81809A Unspecified open wound, unspecified lower leg, initial encounter: Secondary | ICD-10-CM | POA: Diagnosis not present

## 2014-01-09 DIAGNOSIS — S70919A Unspecified superficial injury of unspecified hip, initial encounter: Secondary | ICD-10-CM | POA: Diagnosis not present

## 2014-01-09 DIAGNOSIS — L089 Local infection of the skin and subcutaneous tissue, unspecified: Secondary | ICD-10-CM | POA: Diagnosis not present

## 2014-01-09 DIAGNOSIS — I509 Heart failure, unspecified: Secondary | ICD-10-CM | POA: Diagnosis not present

## 2014-01-18 DIAGNOSIS — E538 Deficiency of other specified B group vitamins: Secondary | ICD-10-CM | POA: Diagnosis not present

## 2014-01-18 DIAGNOSIS — F341 Dysthymic disorder: Secondary | ICD-10-CM | POA: Diagnosis not present

## 2014-01-18 DIAGNOSIS — L089 Local infection of the skin and subcutaneous tissue, unspecified: Secondary | ICD-10-CM | POA: Diagnosis not present

## 2014-01-18 DIAGNOSIS — IMO0002 Reserved for concepts with insufficient information to code with codable children: Secondary | ICD-10-CM | POA: Diagnosis not present

## 2014-02-13 ENCOUNTER — Encounter (HOSPITAL_BASED_OUTPATIENT_CLINIC_OR_DEPARTMENT_OTHER): Payer: Medicare Other | Attending: General Surgery

## 2014-02-20 DIAGNOSIS — E538 Deficiency of other specified B group vitamins: Secondary | ICD-10-CM | POA: Diagnosis not present

## 2014-02-28 DIAGNOSIS — H353190 Nonexudative age-related macular degeneration, unspecified eye, stage unspecified: Secondary | ICD-10-CM | POA: Diagnosis not present

## 2014-02-28 DIAGNOSIS — H47029 Hemorrhage in optic nerve sheath, unspecified eye: Secondary | ICD-10-CM | POA: Diagnosis not present

## 2014-02-28 DIAGNOSIS — H40019 Open angle with borderline findings, low risk, unspecified eye: Secondary | ICD-10-CM | POA: Diagnosis not present

## 2014-02-28 DIAGNOSIS — H01009 Unspecified blepharitis unspecified eye, unspecified eyelid: Secondary | ICD-10-CM | POA: Diagnosis not present

## 2014-03-02 ENCOUNTER — Other Ambulatory Visit: Payer: Self-pay

## 2014-03-02 DIAGNOSIS — L820 Inflamed seborrheic keratosis: Secondary | ICD-10-CM | POA: Diagnosis not present

## 2014-03-02 DIAGNOSIS — D0430 Carcinoma in situ of skin of unspecified part of face: Secondary | ICD-10-CM | POA: Diagnosis not present

## 2014-03-02 DIAGNOSIS — D0439 Carcinoma in situ of skin of other parts of face: Secondary | ICD-10-CM | POA: Diagnosis not present

## 2014-03-02 DIAGNOSIS — C44721 Squamous cell carcinoma of skin of unspecified lower limb, including hip: Secondary | ICD-10-CM | POA: Diagnosis not present

## 2014-03-02 DIAGNOSIS — D485 Neoplasm of uncertain behavior of skin: Secondary | ICD-10-CM | POA: Diagnosis not present

## 2014-03-27 DIAGNOSIS — E538 Deficiency of other specified B group vitamins: Secondary | ICD-10-CM | POA: Diagnosis not present

## 2014-04-10 ENCOUNTER — Other Ambulatory Visit: Payer: Self-pay | Admitting: Dermatology

## 2014-04-10 DIAGNOSIS — D485 Neoplasm of uncertain behavior of skin: Secondary | ICD-10-CM | POA: Diagnosis not present

## 2014-04-10 DIAGNOSIS — B079 Viral wart, unspecified: Secondary | ICD-10-CM | POA: Diagnosis not present

## 2014-04-25 DIAGNOSIS — J479 Bronchiectasis, uncomplicated: Secondary | ICD-10-CM | POA: Diagnosis not present

## 2014-04-25 DIAGNOSIS — Z23 Encounter for immunization: Secondary | ICD-10-CM | POA: Diagnosis not present

## 2014-04-25 DIAGNOSIS — E785 Hyperlipidemia, unspecified: Secondary | ICD-10-CM | POA: Diagnosis not present

## 2014-04-25 DIAGNOSIS — N318 Other neuromuscular dysfunction of bladder: Secondary | ICD-10-CM | POA: Diagnosis not present

## 2014-04-25 DIAGNOSIS — M810 Age-related osteoporosis without current pathological fracture: Secondary | ICD-10-CM | POA: Diagnosis not present

## 2014-04-25 DIAGNOSIS — J449 Chronic obstructive pulmonary disease, unspecified: Secondary | ICD-10-CM | POA: Diagnosis not present

## 2014-04-25 DIAGNOSIS — IMO0001 Reserved for inherently not codable concepts without codable children: Secondary | ICD-10-CM | POA: Diagnosis not present

## 2014-04-25 DIAGNOSIS — E538 Deficiency of other specified B group vitamins: Secondary | ICD-10-CM | POA: Diagnosis not present

## 2014-04-25 DIAGNOSIS — E039 Hypothyroidism, unspecified: Secondary | ICD-10-CM | POA: Diagnosis not present

## 2014-04-26 DIAGNOSIS — L080 Pyoderma: Secondary | ICD-10-CM | POA: Diagnosis not present

## 2014-05-03 DIAGNOSIS — E538 Deficiency of other specified B group vitamins: Secondary | ICD-10-CM | POA: Diagnosis not present

## 2014-05-03 DIAGNOSIS — Z85828 Personal history of other malignant neoplasm of skin: Secondary | ICD-10-CM | POA: Diagnosis not present

## 2014-05-03 DIAGNOSIS — Z08 Encounter for follow-up examination after completed treatment for malignant neoplasm: Secondary | ICD-10-CM | POA: Diagnosis not present

## 2014-05-09 ENCOUNTER — Encounter: Payer: Self-pay | Admitting: Pulmonary Disease

## 2014-05-09 ENCOUNTER — Ambulatory Visit (INDEPENDENT_AMBULATORY_CARE_PROVIDER_SITE_OTHER): Payer: Medicare Other | Admitting: Pulmonary Disease

## 2014-05-09 VITALS — BP 122/68 | HR 79 | Temp 98.3°F | Ht 67.0 in | Wt 138.0 lb

## 2014-05-09 DIAGNOSIS — J479 Bronchiectasis, uncomplicated: Secondary | ICD-10-CM

## 2014-05-09 NOTE — Assessment & Plan Note (Signed)
The patient appears to be stable from a bronchiectasis standpoint. She produces intermittent mucus as expected, but has not had what I would classify as an acute exacerbation. I have asked her to continue to try and use the flutter valve, and she has the albuterol to use if needed. She is to also try and stay as active as possible.

## 2014-05-09 NOTE — Progress Notes (Signed)
   Subjective:    Patient ID: [name], female    DOB: [birth year], 80 y.o.   MRN: [id number]  HPI The patient comes in today for followup of her known bronchiectasis. She has a cough intermittently with discolored mucus production, but has not had increased quantity of mucus or increasing chest symptoms. She has been trying to use the flutter valve, but claims that it causes angina. I suspect this is more related to respiratory muscle soreness. She denies any fevers, chills, or sweats. She has not had increased shortness of breath, and uses her rescue inhaler as needed.   Review of Systems  Constitutional: Negative for fever and unexpected weight change.  HENT: Positive for congestion and postnasal drip. Negative for dental problem, ear pain, nosebleeds, rhinorrhea, sinus pressure, sneezing, sore throat and trouble swallowing.   Eyes: Negative for redness and itching.  Respiratory: Positive for cough and shortness of breath. Negative for chest tightness and wheezing.   Cardiovascular: Negative for palpitations and leg swelling.  Gastrointestinal: Negative for nausea and vomiting.  Genitourinary: Negative for dysuria.  Musculoskeletal: Negative for joint swelling.  Skin: Negative for rash.  Neurological: Negative for headaches.  Hematological: Does not bruise/bleed easily.  Psychiatric/Behavioral: Negative for dysphoric mood. The patient is not nervous/anxious.        Objective:   Physical Exam Thin female in no acute distress Nose without purulence or discharge noted Neck without lymphadenopathy or thyromegaly Chest with a few scattered crackles, no wheezes or rhonchi Cardiac exam with regular rate and rhythm Lower extremities with mild edema, and no cyanosis Alert and oriented, moves all 4 extremities.       Assessment & Plan:

## 2014-05-09 NOTE — Patient Instructions (Signed)
Try to stay on the flutter valve if you can. Continue albuterol as needed. Let us know if you begin to cough up increasing quantity of mucus with more chest congestion.  followup with me again in 52mos.

## 2014-05-17 DIAGNOSIS — Z85828 Personal history of other malignant neoplasm of skin: Secondary | ICD-10-CM | POA: Diagnosis not present

## 2014-05-17 DIAGNOSIS — Z08 Encounter for follow-up examination after completed treatment for malignant neoplasm: Secondary | ICD-10-CM | POA: Diagnosis not present

## 2014-05-17 DIAGNOSIS — L905 Scar conditions and fibrosis of skin: Secondary | ICD-10-CM | POA: Diagnosis not present

## 2014-06-04 ENCOUNTER — Encounter: Payer: Self-pay | Admitting: Pulmonary Disease

## 2014-06-05 DIAGNOSIS — E538 Deficiency of other specified B group vitamins: Secondary | ICD-10-CM | POA: Diagnosis not present

## 2014-07-02 ENCOUNTER — Emergency Department (HOSPITAL_COMMUNITY): Payer: Medicare Other

## 2014-07-02 ENCOUNTER — Observation Stay (HOSPITAL_COMMUNITY)
Admission: EM | Admit: 2014-07-02 | Discharge: 2014-07-03 | Disposition: A | Discharge status: Home / Self Care | Payer: Medicare Other | Attending: Family Medicine | Admitting: Family Medicine

## 2014-07-02 ENCOUNTER — Encounter (HOSPITAL_COMMUNITY): Payer: Self-pay

## 2014-07-02 DIAGNOSIS — Z79899 Other long term (current) drug therapy: Secondary | ICD-10-CM | POA: Insufficient documentation

## 2014-07-02 DIAGNOSIS — M810 Age-related osteoporosis without current pathological fracture: Secondary | ICD-10-CM | POA: Diagnosis not present

## 2014-07-02 DIAGNOSIS — S098XXA Other specified injuries of head, initial encounter: Secondary | ICD-10-CM | POA: Diagnosis not present

## 2014-07-02 DIAGNOSIS — W19XXXA Unspecified fall, initial encounter: Secondary | ICD-10-CM

## 2014-07-02 DIAGNOSIS — S199XXA Unspecified injury of neck, initial encounter: Secondary | ICD-10-CM | POA: Diagnosis not present

## 2014-07-02 DIAGNOSIS — M1990 Unspecified osteoarthritis, unspecified site: Secondary | ICD-10-CM | POA: Diagnosis not present

## 2014-07-02 DIAGNOSIS — Z043 Encounter for examination and observation following other accident: Secondary | ICD-10-CM | POA: Insufficient documentation

## 2014-07-02 DIAGNOSIS — W1839XA Other fall on same level, initial encounter: Secondary | ICD-10-CM | POA: Diagnosis not present

## 2014-07-02 DIAGNOSIS — I509 Heart failure, unspecified: Secondary | ICD-10-CM | POA: Insufficient documentation

## 2014-07-02 DIAGNOSIS — F329 Major depressive disorder, single episode, unspecified: Secondary | ICD-10-CM | POA: Diagnosis not present

## 2014-07-02 DIAGNOSIS — J479 Bronchiectasis, uncomplicated: Secondary | ICD-10-CM

## 2014-07-02 DIAGNOSIS — R4020 Unspecified coma: Secondary | ICD-10-CM | POA: Diagnosis present

## 2014-07-02 DIAGNOSIS — R55 Syncope and collapse: Principal | ICD-10-CM | POA: Diagnosis present

## 2014-07-02 DIAGNOSIS — I2510 Atherosclerotic heart disease of native coronary artery without angina pectoris: Secondary | ICD-10-CM | POA: Diagnosis not present

## 2014-07-02 DIAGNOSIS — K449 Diaphragmatic hernia without obstruction or gangrene: Secondary | ICD-10-CM | POA: Diagnosis not present

## 2014-07-02 DIAGNOSIS — R918 Other nonspecific abnormal finding of lung field: Secondary | ICD-10-CM | POA: Diagnosis not present

## 2014-07-02 DIAGNOSIS — S0990XA Unspecified injury of head, initial encounter: Secondary | ICD-10-CM | POA: Diagnosis not present

## 2014-07-02 DIAGNOSIS — J441 Chronic obstructive pulmonary disease with (acute) exacerbation: Secondary | ICD-10-CM | POA: Diagnosis not present

## 2014-07-02 DIAGNOSIS — F419 Anxiety disorder, unspecified: Secondary | ICD-10-CM | POA: Insufficient documentation

## 2014-07-02 DIAGNOSIS — S0181XA Laceration without foreign body of other part of head, initial encounter: Secondary | ICD-10-CM | POA: Diagnosis not present

## 2014-07-02 DIAGNOSIS — R42 Dizziness and giddiness: Secondary | ICD-10-CM | POA: Diagnosis not present

## 2014-07-02 DIAGNOSIS — E785 Hyperlipidemia, unspecified: Secondary | ICD-10-CM | POA: Insufficient documentation

## 2014-07-02 LAB — CBC WITH DIFFERENTIAL/PLATELET
BASOS ABS: 0 10*3/uL (ref 0.0–0.1)
Basophils Relative: 0 % (ref 0–1)
EOS ABS: 0 10*3/uL (ref 0.0–0.7)
EOS PCT: 0 % (ref 0–5)
HEMATOCRIT: 40.3 % (ref 36.0–46.0)
Hemoglobin: 13.5 g/dL (ref 12.0–15.0)
Lymphocytes Relative: 8 % — ABNORMAL LOW (ref 12–46)
Lymphs Abs: 1 10*3/uL (ref 0.7–4.0)
MCH: 32.5 pg (ref 26.0–34.0)
MCHC: 33.5 g/dL (ref 30.0–36.0)
MCV: 97.1 fL (ref 78.0–100.0)
MONO ABS: 1.1 10*3/uL — AB (ref 0.1–1.0)
Monocytes Relative: 8 % (ref 3–12)
Neutro Abs: 11.5 10*3/uL — ABNORMAL HIGH (ref 1.7–7.7)
Neutrophils Relative %: 84 % — ABNORMAL HIGH (ref 43–77)
Platelets: 177 10*3/uL (ref 150–400)
RBC: 4.15 MIL/uL (ref 3.87–5.11)
RDW: 13.8 % (ref 11.5–15.5)
WBC: 13.7 10*3/uL — AB (ref 4.0–10.5)

## 2014-07-02 LAB — BASIC METABOLIC PANEL
ANION GAP: 11 (ref 5–15)
BUN: 16 mg/dL (ref 6–23)
CALCIUM: 9.2 mg/dL (ref 8.4–10.5)
CO2: 31 meq/L (ref 19–32)
CREATININE: 0.92 mg/dL (ref 0.50–1.10)
Chloride: 98 mEq/L (ref 96–112)
GFR calc Af Amer: 64 mL/min — ABNORMAL LOW (ref >90)
GFR calc non Af Amer: 55 mL/min — ABNORMAL LOW (ref >90)
Glucose, Bld: 105 mg/dL — ABNORMAL HIGH (ref 70–99)
Potassium: 4.2 mEq/L (ref 3.7–5.3)
Sodium: 140 mEq/L (ref 137–147)

## 2014-07-02 LAB — PHOSPHORUS: Phosphorus: 3.3 mg/dL (ref 2.3–4.6)

## 2014-07-02 LAB — MAGNESIUM: MAGNESIUM: 1.9 mg/dL (ref 1.5–2.5)

## 2014-07-02 LAB — D-DIMER, QUANTITATIVE: D-Dimer, Quant: 1.67 ug/mL-FEU — ABNORMAL HIGH (ref 0.00–0.48)

## 2014-07-02 LAB — TROPONIN I: Troponin I: <0.3 ng/mL (ref <0.30)

## 2014-07-02 LAB — LACTIC ACID, PLASMA: Lactic Acid, Venous: 0.9 mmol/L (ref 0.5–2.2)

## 2014-07-02 MED ORDER — IBUPROFEN 400 MG PO TABS: 200.0000 mg | ORAL_TABLET | Freq: Four times a day (QID) | ORAL | Status: DC | PRN

## 2014-07-02 MED ORDER — NITROGLYCERIN 0.4 MG SL SUBL
0.4000 mg | SUBLINGUAL_TABLET | SUBLINGUAL | Status: DC | PRN
Start: 2014-07-02 — End: 2014-07-03

## 2014-07-02 MED ORDER — PANTOPRAZOLE SODIUM 40 MG PO TBEC
40.0000 mg | DELAYED_RELEASE_TABLET | Freq: Every day | ORAL | Status: DC
Start: 2014-07-02 — End: 2014-07-03
  Administered 2014-07-02 – 2014-07-03 (×2): 40 mg via ORAL
  Filled 2014-07-02 (×2): qty 1

## 2014-07-02 MED ORDER — HYPROMELLOSE (GONIOSCOPIC) 2.5 % OP SOLN: 1.0000 [drp] | OPHTHALMIC | Status: DC | PRN

## 2014-07-02 MED ORDER — POLYVINYL ALCOHOL 1.4 % OP SOLN: 1.0000 [drp] | OPHTHALMIC | Status: DC | PRN

## 2014-07-02 MED ORDER — CLORAZEPATE DIPOTASSIUM 7.5 MG PO TABS
3.7500 mg | ORAL_TABLET | Freq: Two times a day (BID) | ORAL | Status: DC
  Administered 2014-07-02 – 2014-07-03 (×2): 3.75 mg via ORAL
  Filled 2014-07-02 (×2): qty 1

## 2014-07-02 MED ORDER — LEVOFLOXACIN IN D5W 750 MG/150ML IV SOLN
750.0000 mg | Freq: Once | INTRAVENOUS | Status: AC
  Administered 2014-07-02: 750 mg via INTRAVENOUS
  Filled 2014-07-02: qty 150

## 2014-07-02 MED ORDER — SODIUM CHLORIDE 0.9 % IJ SOLN
3.0000 mL | Freq: Two times a day (BID) | INTRAMUSCULAR | Status: DC
  Administered 2014-07-02: 3 mL via INTRAVENOUS

## 2014-07-02 MED ORDER — PANTOPRAZOLE SODIUM 20 MG PO TBEC: 20.0000 mg | DELAYED_RELEASE_TABLET | Freq: Every day | ORAL | Status: DC

## 2014-07-02 MED ORDER — HEPARIN SODIUM (PORCINE) 5000 UNIT/ML IJ SOLN
5000.0000 [IU] | Freq: Three times a day (TID) | INTRAMUSCULAR | Status: DC
  Filled 2014-07-02 (×2): qty 1

## 2014-07-02 MED ORDER — ACETAMINOPHEN 650 MG RE SUPP: 650.0000 mg | Freq: Four times a day (QID) | RECTAL | Status: DC | PRN

## 2014-07-02 MED ORDER — ALBUTEROL SULFATE (2.5 MG/3ML) 0.083% IN NEBU: 2.5000 mg | INHALATION_SOLUTION | Freq: Four times a day (QID) | RESPIRATORY_TRACT | Status: DC | PRN

## 2014-07-02 MED ORDER — FESOTERODINE FUMARATE ER 4 MG PO TB24
4.0000 mg | ORAL_TABLET | Freq: Every day | ORAL | Status: DC
  Administered 2014-07-02: 4 mg via ORAL
  Filled 2014-07-02 (×2): qty 1

## 2014-07-02 MED ORDER — HYDROCORTISONE ACETATE 25 MG RE SUPP
25.0000 mg | Freq: Two times a day (BID) | RECTAL | Status: DC | PRN
  Filled 2014-07-02: qty 1

## 2014-07-02 MED ORDER — ONDANSETRON HCL 4 MG PO TABS: 4.0000 mg | ORAL_TABLET | Freq: Four times a day (QID) | ORAL | Status: DC | PRN

## 2014-07-02 MED ORDER — ACETAMINOPHEN 325 MG PO TABS: 650.0000 mg | ORAL_TABLET | Freq: Four times a day (QID) | ORAL | Status: DC | PRN

## 2014-07-02 MED ORDER — LEVOTHYROXINE SODIUM 75 MCG PO TABS
75.0000 ug | ORAL_TABLET | Freq: Every day | ORAL | Status: DC
  Administered 2014-07-03: 75 ug via ORAL
  Filled 2014-07-02: qty 1

## 2014-07-02 MED ORDER — ALBUTEROL SULFATE HFA 108 (90 BASE) MCG/ACT IN AERS: 2.0000 | INHALATION_SPRAY | Freq: Four times a day (QID) | RESPIRATORY_TRACT | Status: DC | PRN

## 2014-07-02 MED ORDER — ONDANSETRON HCL 4 MG/2ML IJ SOLN
4.0000 mg | Freq: Four times a day (QID) | INTRAMUSCULAR | Status: DC | PRN
Start: 2014-07-02 — End: 2014-07-03

## 2014-07-02 MED ORDER — SODIUM CHLORIDE 0.9 % IV SOLN
INTRAVENOUS | Status: DC
  Administered 2014-07-02 – 2014-07-03 (×2): via INTRAVENOUS

## 2014-07-02 NOTE — Progress Notes (Signed)
2 necklaces one with heart shaped charm and one with a gem pendent was removed for CT Cervical spine, jewelry was removed from patient and placed in ziploc bag with patien't name, dob, room number and given back to patient.

## 2014-07-02 NOTE — ED Notes (Signed)
Dr. Roderic Palau informed of pt's c/o L shoulder pain.

## 2014-07-02 NOTE — H&P (Signed)
[facility] History and Physical  [name] GYB:[id number] DOB: Nov 11, 1926 DOA: 07/02/2014  Referring physician: Dr. Roderic Palau PCP: [name], MD   Chief Complaint: Syncope and collapse  HPI: [name] is a 80 y.o. female  With history of COPD and osteoporosis. Who presented to the hospital after an episode of syncope and collapse. Patient is unable to tell me why. She denies tripping over anything. The problem started today. Nothing she is aware of makes it better or worse. Was not associated with bowel or bladder incontinence. She denies any tongue biting. She denies any other episodes like this prior to presentation. She denies any increase in shortness of breath and states that her COPD is at baseline.  During ED evaluation patient was found to have a chest x-ray suspicious for left base infiltrate. CT of head and cervical spine was obtained secondary to history of fall which reported no acute intracranial abnormality and negative for cervical spine fracture. We were consulted for further evaluation recommendations given syncopal episode.   Review of Systems:  Constitutional:  No weight loss, night sweats, Fevers, chills, fatigue.  HEENT:  No headaches, Difficulty swallowing,Tooth/dental problems,Sore throat,  No sneezing, itching, ear ache, nasal congestion, post nasal drip,  Cardio-vascular:  No chest pain, Orthopnea, PND, swelling in lower extremities, anasarca, dizziness, palpitations  GI:  No heartburn, indigestion, abdominal pain, nausea, vomiting, diarrhea, change in bowel habits, loss of appetite  Resp:  + shortness of breath with exertion or at rest (chronic as patient has copd). No excess mucus, no productive cough, No non-productive cough, No coughing up of blood.No change in color of mucus.No wheezing.No chest wall deformity  Skin:  no rash or lesions.  GU:  no dysuria, change in color of urine, no urgency or frequency. No flank pain.    Musculoskeletal:  No joint pain or swelling. No decreased range of motion. No back pain.  Psych:  No change in mood or affect. No depression or anxiety. No memory loss.   Past Medical History  Diagnosis Date  . Hyperlipidemia   . Irregular heartbeat   . Arthritis   . Fibromyalgia   . CHF (congestive heart failure)   . Coronary artery disease   . Hiatal hernia   . COPD (chronic obstructive pulmonary disease)   . Osteoporosis   . Anxiety   . Depression   . Rotator cuff tear     RIGHT   Past Surgical History  Procedure Laterality Date  . Appendectomy    . Dilation and curettage of uterus      x2  . Elbow surgery      Right  . Cataract extraction      Left   Social History:  reports that she has never smoked. She has never used smokeless tobacco. She reports that she does not drink alcohol. Her drug history is not on file.  No Known Allergies  Family History  Problem Relation Age of Onset  . Pancreatic cancer Father   . Heart failure Mother   . Hypertension Mother   . Heart attack Brother   . Hypertension Brother   . Stroke Brother   . Heart attack Sister   . Diabetes Son   . Obesity Son   . Heart attack Son      Prior to Admission medications   Medication Sig Start Date End Date Taking? Authorizing Provider  albuterol (PROVENTIL HFA;VENTOLIN HFA) 108 (90 BASE) MCG/ACT inhaler Inhale 2 puffs into the lungs every 6 (  six) hours as needed. 04/20/12  Yes Kathee Delton, MD  Carica Papaya (PAPAYA PO) Take 2-3 tablets by mouth daily as needed (digestion).    Yes Historical Provider, MD  Cholecalciferol (VITAMIN D3) 1000 UNITS CAPS Take 1 capsule by mouth daily.     Yes Historical Provider, MD  clorazepate (TRANXENE) 7.5 MG tablet Take 3.75 mg by mouth 2 (two) times daily.    Yes Historical Provider, MD  cyanocobalamin (,VITAMIN B-12,) 1000 MCG/ML injection Inject 1,000 mcg into the muscle every 30 (thirty) days.     Yes Historical Provider, MD  fesoterodine (TOVIAZ) 4  MG TB24 tablet Take 4 mg by mouth at bedtime.   Yes Historical Provider, MD  hydrocortisone (ANUSOL-HC) 25 MG suppository Place 25 mg rectally as needed.     Yes Historical Provider, MD  hydroxypropyl methylcellulose (ISOPTO TEARS) 2.5 % ophthalmic solution Place 1 drop into both eyes as needed for dry eyes.    Yes Historical Provider, MD  ibuprofen (ADVIL,MOTRIN) 200 MG tablet Take 200 mg by mouth every 6 (six) hours as needed for moderate pain.    Yes Historical Provider, MD  lansoprazole (PREVACID) 15 MG capsule Take 15 mg by mouth daily as needed.    Yes Historical Provider, MD  levothyroxine (SYNTHROID, LEVOTHROID) 75 MCG tablet Take 75 mcg by mouth daily.     Yes Historical Provider, MD  Multiple Vitamin (MULTIVITAMIN) capsule Take 1 capsule by mouth daily.     Yes Historical Provider, MD  Multiple Vitamins-Minerals (ICAPS) CAPS Take 1 capsule by mouth daily.   Yes Historical Provider, MD  nitroGLYCERIN (NITROSTAT) 0.4 MG SL tablet Place 0.4 mg under the tongue every 5 (five) minutes as needed.     Yes Historical Provider, MD  Polyethyl Glycol-Propyl Glycol (SYSTANE) 0.4-0.3 % SOLN Apply to eye as directed.     Yes Historical Provider, MD  polyethylene glycol (MIRALAX / GLYCOLAX) packet Take 17 g by mouth daily as needed for mild constipation.    Yes Historical Provider, MD   Physical Exam: Filed Vitals:   07/02/14 1330 07/02/14 1354 07/02/14 1400 07/02/14 1430  BP: 129/72 129/72 136/105 137/69  Pulse:  102  102  Temp:      TempSrc:      Resp:  20 18 21   Height:      Weight:      SpO2:  98%  100%    Wt Readings from Last 3 Encounters:  07/02/14 59.875 kg (132 lb)  05/09/14 62.596 kg (138 lb)  11/07/13 60.328 kg (133 lb)    General:  Appears calm and comfortable, disheveled Eyes: PERRL, normal lids, irises & conjunctiva ENT: grossly normal hearing, lips & tongue Neck: no LAD, masses or thyromegaly Cardiovascular: RRR, no m/r/g. No LE edema. Telemetry: SR, no arrhythmias ,  with multiple pvc's Respiratory: CTA bilaterally, no w/r/r. Normal respiratory effort. Abdomen: soft, nt, nd Skin: no rash or induration seen on limited exam Musculoskeletal: grossly normal tone BUE/BLE Psychiatric: grossly normal mood and affect, speech fluent and appropriate Neurologic: Patient answers questions appropriately, no facial asymmetry, moves extremities equally           Labs on Admission:  Basic Metabolic Panel:  Recent Labs Lab 07/02/14 1122  NA 140  K 4.2  CL 98  CO2 31  GLUCOSE 105*  BUN 16  CREATININE 0.92  CALCIUM 9.2   Liver Function Tests: No results for input(s): AST, ALT, ALKPHOS, BILITOT, PROT, ALBUMIN in the last 168 hours. No results for  input(s): LIPASE, AMYLASE in the last 168 hours. No results for input(s): AMMONIA in the last 168 hours. CBC:  Recent Labs Lab 07/02/14 1122  WBC 13.7*  NEUTROABS 11.5*  HGB 13.5  HCT 40.3  MCV 97.1  PLT 177   Cardiac Enzymes:  Recent Labs Lab 07/02/14 1122  TROPONINI <0.30    BNP (last 3 results) No results for input(s): PROBNP in the last 8760 hours. CBG: No results for input(s): GLUCAP in the last 168 hours.  Radiological Exams on Admission: Ct Head Wo Contrast  07/02/2014   CLINICAL DATA:  Fall.  Dizziness  EXAM: CT HEAD WITHOUT CONTRAST  CT CERVICAL SPINE WITHOUT CONTRAST  TECHNIQUE: Multidetector CT imaging of the head and cervical spine was performed following the standard protocol without intravenous contrast. Multiplanar CT image reconstructions of the cervical spine were also generated.  COMPARISON:  None.  FINDINGS: CT HEAD FINDINGS  Mild atrophy. Mild to moderate chronic microvascular ischemia in the white matter.  Negative for acute infarct. Negative for hemorrhage or mass. Negative for skull fracture.  CT CERVICAL SPINE FINDINGS  Negative for cervical spine fracture.  Normal alignment.  Disc spaces are normal. There is diffuse facet degeneration throughout the cervical spine.   IMPRESSION: Atrophy and chronic microvascular ischemia. No acute intracranial abnormality.  Negative for cervical spine fracture.   Electronically Signed   By: Franchot Gallo M.D.   On: 07/02/2014 14:04   Ct Cervical Spine Wo Contrast  07/02/2014   CLINICAL DATA:  Fall.  Dizziness  EXAM: CT HEAD WITHOUT CONTRAST  CT CERVICAL SPINE WITHOUT CONTRAST  TECHNIQUE: Multidetector CT imaging of the head and cervical spine was performed following the standard protocol without intravenous contrast. Multiplanar CT image reconstructions of the cervical spine were also generated.  COMPARISON:  None.  FINDINGS: CT HEAD FINDINGS  Mild atrophy. Mild to moderate chronic microvascular ischemia in the white matter.  Negative for acute infarct. Negative for hemorrhage or mass. Negative for skull fracture.  CT CERVICAL SPINE FINDINGS  Negative for cervical spine fracture.  Normal alignment.  Disc spaces are normal. There is diffuse facet degeneration throughout the cervical spine.  IMPRESSION: Atrophy and chronic microvascular ischemia. No acute intracranial abnormality.  Negative for cervical spine fracture.   Electronically Signed   By: Franchot Gallo M.D.   On: 07/02/2014 14:04   Dg Chest Portable 1 View  07/02/2014   CLINICAL DATA:  Syncope  EXAM: PORTABLE CHEST - 1 VIEW  COMPARISON:  Chest radiograph October 12, 2011 and chest CT October 19, 2011  FINDINGS: There is left base infiltrate. There is underlying emphysema. Elsewhere, lungs are clear. The heart size and pulmonary vascularity are normal. No adenopathy. No bone lesions.  IMPRESSION: Left base infiltrate.  Underlying emphysema.   Electronically Signed   By: Lowella Grip M.D.   On: 07/02/2014 11:51    Telemetry: Patient sinus rhythm with mulpitle PVCs  Assessment/Plan Principal Problem:   Syncope and collapse - Etiology uncertain at this point. - Says no reports of bowel or bladder incontinence or tongue biting we will not obtain EEG - Telemetry  monitoring, echocardiogram, carotid Dopplers - Obtain d-dimer and if positive will obtain CT angiogram of chest. - Orthostatic vital signs - CT of head negative  Pneumonia -Community-acquired pneumonia suspicion lives at home by herself. -Levaquin -Urine Legionella/strep antigen -Blood cultures  Active Problems:   Bronchiectasis without acute exacerbation - Stable, continue home regimen.    Osteoporosis - Stable continue home regimen once discharged  Code Status: Full code DVT Prophylaxis: Heparin Family Communication: Discussed with patient and brother bedside Disposition Plan: Telemetry monitoring  Time spent: > 55 minutes  Velvet Bathe [facility] Pager [phone]

## 2014-07-02 NOTE — ED Notes (Signed)
Report given to [place], Therapist, sports. Pt ready for transfer.

## 2014-07-02 NOTE — ED Notes (Signed)
Pt states she got dizzy and passed out. Pt states she was having chest prior to fall

## 2014-07-02 NOTE — ED Notes (Signed)
Patient wanted assistance with a bedside commode patient was told that we did not want to get her out of the bed until CT was done and results were back. Assisted patient with a female urinal patient states she is unable to void in the bed.

## 2014-07-02 NOTE — Progress Notes (Signed)
ANTIBIOTIC CONSULT NOTE  Pharmacy Consult for Levaquin Indication: pneumonia  No Known Allergies  Patient Measurements: Height: 5\' 7"  (170.2 cm) Weight: 132 lb (59.875 kg) IBW/kg (Calculated) : 61.6  Vital Signs: Temp: 99.6 F (37.6 C) (11/30 1119) Temp Source: Oral (11/30 1119) BP: 137/69 mmHg (11/30 1430) Pulse Rate: 102 (11/30 1430) Intake/Output from previous day:   Intake/Output from this shift:    Labs:  Recent Labs  07/02/14 1122  WBC 13.7*  HGB 13.5  PLT 177  CREATININE 0.92   Estimated Creatinine Clearance: 42.3 mL/min (by C-G formula based on Cr of 0.92). No results for input(s): VANCOTROUGH, VANCOPEAK, VANCORANDOM, GENTTROUGH, GENTPEAK, GENTRANDOM, TOBRATROUGH, TOBRAPEAK, TOBRARND, AMIKACINPEAK, AMIKACINTROU, AMIKACIN in the last 72 hours.   Microbiology: No results found for this or any previous visit (from the past 720 hour(s)).  Anti-infectives    Start     Dose/Rate Route Frequency Ordered Stop   07/02/14 1545  levofloxacin (LEVAQUIN) IVPB 750 mg     750 mg100 mL/hr over 90 Minutes Intravenous  Once 07/02/14 1543        Assessment: 38 yoF admitted with syncope.  WBC elevated & CXR + PNA so Levaquin is being initiated for CAP.   Renal function at patient's baseline.  Levaquin dose adjusted for CrCl<67ml/min.   Levaquin 11/30>>  Goal of Therapy:  Eradicate infection.  Plan:  Levaquin 750mg  IV q48h Monitor renal function and cx data   Biagio Borg 07/02/2014,3:55 PM

## 2014-07-02 NOTE — ED Provider Notes (Signed)
CSN: [id number]     Arrival date & time 07/02/14  1109 History  This chart was scribed for Maudry Diego, MD by Stephania Fragmin, ED Scribe. This patient was seen in room APA01/APA01 and the patient's care was started at 12:30 PM.    Chief Complaint  Patient presents with  . Loss of Consciousness   Patient is a 80 y.o. female presenting with syncope. The history is provided by the patient. No language interpreter was used.  Loss of Consciousness Episode history:  Single Most recent episode:  Today Timing:  Rare Chronicity:  New Relieved by:  None tried Worsened by:  Nothing tried Ineffective treatments:  None tried Associated symptoms: shortness of breath   Associated symptoms: no chest pain, no headaches and no seizures   Risk factors comment:  COPD    HPI Comments: [name] is a 80 y.o. female who presents to the Emergency Department for a fall that occurred this morning which she suspects was due to weakness. She suspects she may have lost consciousness because she reports seeing that she was on the floor surrounded by blood when she came to. She reports that she has been coughing and experiencing SOB due to COPD. She denies taking blood thinners.   Past Medical History  Diagnosis Date  . Hyperlipidemia   . Irregular heartbeat   . Arthritis   . Fibromyalgia   . CHF (congestive heart failure)   . Coronary artery disease   . Hiatal hernia   . COPD (chronic obstructive pulmonary disease)   . Osteoporosis   . Anxiety   . Depression   . Rotator cuff tear     RIGHT   Past Surgical History  Procedure Laterality Date  . Appendectomy    . Dilation and curettage of uterus      x2  . Elbow surgery      Right  . Cataract extraction      Left   Family History  Problem Relation Age of Onset  . Pancreatic cancer Father   . Heart failure Mother   . Hypertension Mother   . Heart attack Brother   . Hypertension Brother   . Stroke Brother   . Heart attack Sister   .  Diabetes Son   . Obesity Son   . Heart attack Son    History  Substance Use Topics  . Smoking status: Never Smoker   . Smokeless tobacco: Never Used  . Alcohol Use: No   OB History    Gravida Para Term Preterm AB TAB SAB Ectopic Multiple Living   1 1        1      Review of Systems  Constitutional: Negative for appetite change.  HENT: Negative for congestion, ear discharge and sinus pressure.   Eyes: Negative for discharge.  Respiratory: Positive for cough, shortness of breath and wheezing.   Cardiovascular: Positive for syncope. Negative for chest pain.  Gastrointestinal: Negative for abdominal pain and diarrhea.  Genitourinary: Negative for frequency and hematuria.  Musculoskeletal: Negative for back pain.  Skin: Negative for rash.  Neurological: Positive for syncope. Negative for seizures and headaches.  Psychiatric/Behavioral: Negative for hallucinations.      Allergies  Review of patient's allergies indicates no known allergies.  Home Medications   Prior to Admission medications   Medication Sig Start Date End Date Taking? Authorizing Provider  albuterol (PROVENTIL HFA;VENTOLIN HFA) 108 (90 BASE) MCG/ACT inhaler Inhale 2 puffs into the lungs every 6 (six) hours  as needed. 04/20/12   Kathee Delton, MD  Carica Papaya (PAPAYA PO) Take by mouth as needed.     Historical Provider, MD  Cholecalciferol (VITAMIN D3) 1000 UNITS CAPS Take 1 capsule by mouth daily.      Historical Provider, MD  clorazepate (TRANXENE) 7.5 MG tablet Take 3.75 mg by mouth 2 (two) times daily.     Historical Provider, MD  cyanocobalamin (,VITAMIN B-12,) 1000 MCG/ML injection Inject 1,000 mcg into the muscle every 30 (thirty) days.      Historical Provider, MD  dibucaine (NUPERCAINAL) 1 % OINT Place rectally as needed.      Historical Provider, MD  hydrocortisone (ANUSOL-HC) 25 MG suppository Place 25 mg rectally as needed.      Historical Provider, MD  hydroxypropyl methylcellulose (ISOPTO TEARS)  2.5 % ophthalmic solution Place 1 drop into both eyes as needed.    Historical Provider, MD  ibuprofen (ADVIL,MOTRIN) 200 MG tablet Take 200 mg by mouth every 6 (six) hours as needed.    Historical Provider, MD  lansoprazole (PREVACID) 15 MG capsule Take 15 mg by mouth daily as needed.     Historical Provider, MD  levothyroxine (SYNTHROID, LEVOTHROID) 75 MCG tablet Take 75 mcg by mouth daily.      Historical Provider, MD  Multiple Vitamin (MULTIVITAMIN) capsule Take 1 capsule by mouth daily.      Historical Provider, MD  Multiple Vitamins-Minerals (ICAPS) CAPS Take 1 capsule by mouth daily.    Historical Provider, MD  nitroGLYCERIN (NITROSTAT) 0.4 MG SL tablet Place 0.4 mg under the tongue every 5 (five) minutes as needed.      Historical Provider, MD  Polyethyl Glycol-Propyl Glycol (SYSTANE) 0.4-0.3 % SOLN Apply to eye as directed.      Historical Provider, MD  polyethylene glycol (MIRALAX / GLYCOLAX) packet Take 17 g by mouth daily.      Historical Provider, MD  Solifenacin Succinate (VESICARE PO) Take by mouth daily.      Historical Provider, MD   BP 116/72 mmHg  Pulse 93  Temp(Src) 99.6 F (37.6 C) (Oral)  Resp 27  Ht 5\' 7"  (1.702 m)  Wt 132 lb (59.875 kg)  BMI 20.67 kg/m2  SpO2 98% Physical Exam  Constitutional: She is oriented to person, place, and time. She appears well-developed.  HENT:  Head: Normocephalic.  Eyes: Conjunctivae and EOM are normal. No scleral icterus.  Neck: Neck supple. No thyromegaly present.  Cardiovascular: Exam reveals no gallop and no friction rub.   No murmur heard. Irregular heartbeat.  Pulmonary/Chest: No stridor. She has wheezes. She has no rales. She exhibits no tenderness.  Mild wheezing bilaterally.  Abdominal: She exhibits no distension. There is no tenderness. There is no rebound.  Musculoskeletal: Normal range of motion. She exhibits no edema.  Lymphadenopathy:    She has no cervical adenopathy.  Neurological: She is oriented to person,  place, and time. She exhibits normal muscle tone. Coordination normal.  Skin: No rash noted. No erythema.  Abrasion to left temporal head.  Psychiatric: She has a normal mood and affect. Her behavior is normal.  Nursing note and vitals reviewed.   ED Course  Procedures (including critical care time)  DIAGNOSTIC STUDIES: Oxygen Saturation is 98% on room air, normal by my interpretation.    COORDINATION OF CARE: 12:33 PM - Discussed treatment plan with pt at bedside and pt agreed to plan.   Labs Review Labs Reviewed  CBC WITH DIFFERENTIAL - Abnormal; Notable for the following:  WBC 13.7 (*)    Neutrophils Relative % 84 (*)    Neutro Abs 11.5 (*)    Lymphocytes Relative 8 (*)    Monocytes Absolute 1.1 (*)    All other components within normal limits  BASIC METABOLIC PANEL - Abnormal; Notable for the following:    Glucose, Bld 105 (*)    GFR calc non Af Amer 55 (*)    GFR calc Af Amer 64 (*)    All other components within normal limits  TROPONIN I    Imaging Review Dg Chest Portable 1 View  07/02/2014   CLINICAL DATA:  Syncope  EXAM: PORTABLE CHEST - 1 VIEW  COMPARISON:  Chest radiograph October 12, 2011 and chest CT October 19, 2011  FINDINGS: There is left base infiltrate. There is underlying emphysema. Elsewhere, lungs are clear. The heart size and pulmonary vascularity are normal. No adenopathy. No bone lesions.  IMPRESSION: Left base infiltrate.  Underlying emphysema.   Electronically Signed   By: Lowella Grip M.D.   On: 07/02/2014 11:51     EKG Interpretation None      MDM   Final diagnoses:  Syncope    Admit for syncope  The chart was scribed for me under my direct supervision.  I personally performed the history, physical, and medical decision making and all procedures in the evaluation of this patient.Maudry Diego, MD 07/02/14 [phone]

## 2014-07-03 ENCOUNTER — Observation Stay (HOSPITAL_COMMUNITY): Payer: Medicare Other

## 2014-07-03 ENCOUNTER — Encounter (HOSPITAL_COMMUNITY): Payer: Self-pay | Admitting: Radiology

## 2014-07-03 DIAGNOSIS — R918 Other nonspecific abnormal finding of lung field: Secondary | ICD-10-CM | POA: Diagnosis not present

## 2014-07-03 DIAGNOSIS — I359 Nonrheumatic aortic valve disorder, unspecified: Secondary | ICD-10-CM | POA: Diagnosis not present

## 2014-07-03 DIAGNOSIS — R55 Syncope and collapse: Secondary | ICD-10-CM | POA: Diagnosis not present

## 2014-07-03 DIAGNOSIS — R079 Chest pain, unspecified: Secondary | ICD-10-CM | POA: Diagnosis not present

## 2014-07-03 DIAGNOSIS — J90 Pleural effusion, not elsewhere classified: Secondary | ICD-10-CM | POA: Diagnosis not present

## 2014-07-03 LAB — STREP PNEUMONIAE URINARY ANTIGEN: Strep Pneumo Urinary Antigen: NEGATIVE

## 2014-07-03 LAB — CBC
HEMATOCRIT: 36 % (ref 36.0–46.0)
Hemoglobin: 11.9 g/dL — ABNORMAL LOW (ref 12.0–15.0)
MCH: 32.3 pg (ref 26.0–34.0)
MCHC: 33.1 g/dL (ref 30.0–36.0)
MCV: 97.8 fL (ref 78.0–100.0)
Platelets: 164 10*3/uL (ref 150–400)
RBC: 3.68 MIL/uL — ABNORMAL LOW (ref 3.87–5.11)
RDW: 13.9 % (ref 11.5–15.5)
WBC: 8.6 10*3/uL (ref 4.0–10.5)

## 2014-07-03 LAB — BASIC METABOLIC PANEL
Anion gap: 9 (ref 5–15)
BUN: 14 mg/dL (ref 6–23)
CHLORIDE: 105 meq/L (ref 96–112)
CO2: 29 meq/L (ref 19–32)
Calcium: 8.5 mg/dL (ref 8.4–10.5)
Creatinine, Ser: 0.95 mg/dL (ref 0.50–1.10)
GFR calc Af Amer: 62 mL/min — ABNORMAL LOW (ref >90)
GFR calc non Af Amer: 53 mL/min — ABNORMAL LOW (ref >90)
Glucose, Bld: 96 mg/dL (ref 70–99)
POTASSIUM: 4.3 meq/L (ref 3.7–5.3)
Sodium: 143 mEq/L (ref 137–147)

## 2014-07-03 LAB — LEGIONELLA ANTIGEN, URINE

## 2014-07-03 MED ORDER — LEVOFLOXACIN IN D5W 750 MG/150ML IV SOLN: 750.0000 mg | INTRAVENOUS | Status: DC

## 2014-07-03 MED ORDER — IOHEXOL 350 MG/ML SOLN
100.0000 mL | Freq: Once | INTRAVENOUS | Status: AC | PRN
  Administered 2014-07-03: 100 mL via INTRAVENOUS

## 2014-07-03 NOTE — Evaluation (Signed)
Physical Therapy Evaluation Patient Details Name: [name] MRN: [id number] DOB: Apr 30, 1927 Today's Date: 07/03/2014   History of Present Illness  With history of COPD and osteoporosis. Who presented to the hospital after an episode of syncope and collapse. Patient is unable to tell me why. She denies tripping over anything. The problem started today. Nothing she is aware of makes it better or worse. Was not associated with bowel or bladder incontinence. She denies any tongue biting. She denies any other episodes like this prior to presentation. She denies any increase in shortness of breath and states that her COPD is at baseline.  Clinical Impression  Pt was seen today for evaluation.  She is a very pleasant pt who has no specific c/o.  She was on O2 earlier in the day but is off of it now.  O2 sats were checked and found to be in the 90s on room air at rest and with exertion.  She lives alone but her fiancee is available to assist as needed at discharge.  She is normally independent at home with no assistive device.  She hit the right forehead during her fall but has no acute problem from that.  Her strength and balance are WNL.  She did ambulate with a cane for security and her gait is stable for functional distances.  At some time in the future, she may decide to have outpatient PT for which she already has a prescription.  Currently, she has no PT needs.    Follow Up Recommendations No PT follow up (may want to do outpatient PT in the future)    Equipment Recommendations  None recommended by PT    Recommendations for Other Services   none    Precautions / Restrictions Precautions Precautions: Fall Restrictions Weight Bearing Restrictions: No      Mobility  Bed Mobility Overal bed mobility: Independent                Transfers Overall transfer level: Modified independent                  Ambulation/Gait Ambulation/Gait assistance: Modified independent  (Device/Increase time) Ambulation Distance (Feet): 150 Feet Assistive device: Straight cane Gait Pattern/deviations: WFL(Within Functional Limits)   Gait velocity interpretation: Below normal speed for age/gender    Stairs            Wheelchair Mobility    Modified Rankin (Stroke Patients Only)       Balance Overall balance assessment: Modified Independent (dynamic balance is "good" as tested with high balance activity)                                           Pertinent Vitals/Pain Pain Assessment: No/denies pain    Home Living Family/patient expects to be discharged to:: Private residence Living Arrangements: Alone (has a fiance who is available as much as needed) Available Help at Discharge: Friend(s);Available 24 hours/day Type of Home: House Home Access: Level entry     Home Layout: One level Home Equipment: Cane - single point;Walker - 2 wheels      Prior Function Level of Independence: Independent               Hand Dominance        Extremity/Trunk Assessment               Lower Extremity Assessment: Overall  WFL for tasks assessed      Cervical / Trunk Assessment: Kyphotic  Communication   Communication: No difficulties  Cognition Arousal/Alertness: Awake/alert Behavior During Therapy: WFL for tasks assessed/performed Overall Cognitive Status: Within Functional Limits for tasks assessed                      General Comments      Exercises        Assessment/Plan    PT Assessment Patent does not need any further PT services  PT Diagnosis     PT Problem List    PT Treatment Interventions     PT Goals (Current goals can be found in the Care Plan section) Acute Rehab PT Goals PT Goal Formulation: All assessment and education complete, DC therapy    Frequency     Barriers to discharge    none    Co-evaluation               End of Session Equipment Utilized During Treatment: Gait  belt Activity Tolerance: Patient tolerated treatment well Patient left: in bed;with call bell/phone within reach;with bed alarm set;with family/visitor present      Functional Assessment Tool Used: clinical judgement Functional Limitation: Mobility: Walking and moving around Mobility: Walking and Moving Around Current Status ([id number]): At least 1 percent but less than 20 percent impaired, limited or restricted Mobility: Walking and Moving Around Goal Status [phone]): At least 1 percent but less than 20 percent impaired, limited or restricted Mobility: Walking and Moving Around Discharge Status [id number]): At least 1 percent but less than 20 percent impaired, limited or restricted    Time: 1437-1507 PT Time Calculation (min) (ACUTE ONLY): 30 min   Charges:   PT Evaluation $Initial PT Evaluation Tier I: 1 Procedure     PT G Codes:   Functional Assessment Tool Used: clinical judgement Functional Limitation: Mobility: Walking and moving around    [place] 07/03/2014, 3:23 PM

## 2014-07-03 NOTE — Progress Notes (Signed)
UR completed 

## 2014-07-03 NOTE — Care Management Note (Signed)
    Page 1 of 1   07/03/2014     3:40:38 PM CARE MANAGEMENT NOTE 07/03/2014  Patient:  [name], [name]   Account Number:  [id number]  Date Initiated:  07/03/2014  Documentation initiated by:  Jolene Provost  Subjective/Objective Assessment:   Pt is from home, Lives alone but has a fiance that is there for support if needed. Pt has no HH services, DME's or med needs.     Action/Plan:   Pt to discharge home with self care today. No CM needs identified at this time.   Anticipated DC Date:  07/03/2014   Anticipated DC Plan:  [place]  CM consult      Choice offered to / List presented to:             Status of service:  Completed, signed off Medicare Important Message given?   (If response is "NO", the following Medicare IM given date fields will be blank) Date Medicare IM given:   Medicare IM given by:   Date Additional Medicare IM given:   Additional Medicare IM given by:    Discharge Disposition:  HOME/SELF CARE  Per UR Regulation:    If discussed at Long Length of Stay Meetings, dates discussed:    Comments:  07/03/2014 [place], RN, MSN, [facility]

## 2014-07-03 NOTE — Progress Notes (Signed)
ANTIBIOTIC CONSULT NOTE  Pharmacy Consult for Levaquin Indication: pneumonia  No Known Allergies  Patient Measurements: Height: 5\' 7"  (170.2 cm) Weight: 132 lb (59.875 kg) IBW/kg (Calculated) : 61.6  Vital Signs: Temp: 98.2 F (36.8 C) (12/01 0645) Temp Source: Oral (12/01 0645) BP: 138/59 mmHg (12/01 0645) Pulse Rate: 77 (12/01 0645) Intake/Output from previous day: 11/30 0701 - 12/01 0700 In: 323.8 [I.V.:323.8] Out: 350 [Urine:350] Intake/Output from this shift:    Labs:  Recent Labs  07/02/14 1122 07/03/14 0526  WBC 13.7* 8.6  HGB 13.5 11.9*  PLT 177 164  CREATININE 0.92 0.95   Estimated Creatinine Clearance: 40.9 mL/min (by C-G formula based on Cr of 0.95). No results for input(s): VANCOTROUGH, VANCOPEAK, VANCORANDOM, GENTTROUGH, GENTPEAK, GENTRANDOM, TOBRATROUGH, TOBRAPEAK, TOBRARND, AMIKACINPEAK, AMIKACINTROU, AMIKACIN in the last 72 hours.   Microbiology: Recent Results (from the past 720 hour(s))  Culture, blood (routine x 2)     Status: None (Preliminary result)   Collection Time: 07/02/14  3:55 PM  Result Value Ref Range Status   Specimen Description BLOOD RIGHT HAND  Final   Special Requests BOTTLES DRAWN AEROBIC AND ANAEROBIC 8CC  Final   Culture NO GROWTH 1 DAY  Final   Report Status PENDING  Incomplete  Culture, blood (routine x 2)     Status: None (Preliminary result)   Collection Time: 07/02/14  4:05 PM  Result Value Ref Range Status   Specimen Description BLOOD LEFT ARM  Final   Special Requests BOTTLES DRAWN AEROBIC AND ANAEROBIC 16CC  Final   Culture NO GROWTH 1 DAY  Final   Report Status PENDING  Incomplete    Anti-infectives    Start     Dose/Rate Route Frequency Ordered Stop   07/04/14 1800  levofloxacin (LEVAQUIN) IVPB 750 mg     750 mg100 mL/hr over 90 Minutes Intravenous Every 48 hours 07/03/14 1307     07/02/14 1545  levofloxacin (LEVAQUIN) IVPB 750 mg     750 mg100 mL/hr over 90 Minutes Intravenous  Once 07/02/14 1543 07/02/14  1744     Assessment: 54 yoF admitted with syncope.  WBC elevated on admission but improved today.  CXR + PNA so Levaquin is being initiated for CAP.   Renal function at patient's baseline.  Levaquin dose adjusted for CrCl<17ml/min.   Levaquin 11/30>>  Goal of Therapy:  Eradicate infection.  Plan:   Levaquin 750mg  IV q48h  Monitor renal function and cx data   Switch to PO when improved / appropriate  Hart Robinsons A 07/03/2014,1:10 PM

## 2014-07-03 NOTE — Progress Notes (Signed)
  Echocardiogram 2D Echocardiogram has been performed.  [place], [facility] 07/03/2014, 2:32 PM

## 2014-07-03 NOTE — Progress Notes (Signed)
Discharge instructions reviewed with pt. Pt is aware to alert her PCP regarding need for possible EEG once discharged. All medications reviewed and no current questions or concerns. Pt will be leaving once she has eaten dinner

## 2014-07-03 NOTE — Progress Notes (Signed)
Physician Discharge Summary  [name] MHD:[id number] DOB: 10-25-1926 DOA: 07/02/2014  PCP: Melinda Stack, MD  Admit date: 07/02/2014 Discharge date: 07/03/2014  Time spent: > 35 minutes  Recommendations for Outpatient Follow-up:  1. CT angiogram reported multiple pulmonary nodules. Please follow-up as recommended by radiology depending on patient's history and risk for bronchogenic carcinoma.  2. There was initial concern for pulmonary infiltrate but CT angiogram did not report any infiltrates as such Levaquin be discontinued on discharge.  Discharge Diagnoses:  Principal Problem:   Syncope and collapse Active Problems:   Bronchiectasis without acute exacerbation   Osteoporosis   Discharge Condition: stable  Diet recommendation: heart healthy  Filed Weights   07/02/14 1112  Weight: 59.875 kg (132 lb)    History of present illness:  Patient is an 80 year old with history of COPD and osteoporosis who presented after syncopal episode. The patient reports she had taken a nitroglycerin tablet prior to syncope.  Hospital Course:  Syncope: - Workup negative. CT of the head reported no acute intracranial abnormality, CT angiogram of chest reported no PE. Did however make mention of pulmonary nodules. Please see report for details - Physical therapy did not recommend any PT follow-up - Ultrasound of carotids did not report any significant stricture - Patient reports that she had just gotten up and taken a nitroglycerin prior syncopal episode. As such I suspect orthostatic hypotension potentiated by medication. - Troponin negative and telemetry monitoring did not report any red flags  Procedures:  As listed below  Consultations:  None  Discharge Exam: Filed Vitals:   07/03/14 0645  BP: 138/59  Pulse: 77  Temp: 98.2 F (36.8 C)  Resp: 20    General: Pt in nad, alert and awake Cardiovascular: rrr, no mrg Respiratory: cta bl, no wheezes  Discharge  Instructions You were cared for by a hospitalist during your hospital stay. If you have any questions about your discharge medications or the care you received while you were in the hospital after you are discharged, you can call the unit and asked to speak with the hospitalist on call if the hospitalist that took care of you is not available. Once you are discharged, your primary care physician will handle any further medical issues. Please note that NO REFILLS for any discharge medications will be authorized once you are discharged, as it is imperative that you return to your primary care physician (or establish a relationship with a primary care physician if you do not have one) for your aftercare needs so that they can reassess your need for medications and monitor your lab values.  Discharge Instructions    Call MD for:  severe uncontrolled pain    Complete by:  As directed      Call MD for:  temperature >100.4    Complete by:  As directed      Diet - low sodium heart healthy    Complete by:  As directed      Increase activity slowly    Complete by:  As directed           Current Discharge Medication List    CONTINUE these medications which have NOT CHANGED   Details  albuterol (PROVENTIL HFA;VENTOLIN HFA) 108 (90 BASE) MCG/ACT inhaler Inhale 2 puffs into the lungs every 6 (six) hours as needed. Qty: 1 Inhaler, Refills: 5    Carica Papaya (PAPAYA PO) Take 2-3 tablets by mouth daily as needed (digestion).     Cholecalciferol (VITAMIN D3) 1000 UNITS  CAPS Take 1 capsule by mouth daily.      clorazepate (TRANXENE) 7.5 MG tablet Take 3.75 mg by mouth 2 (two) times daily.     cyanocobalamin (,VITAMIN B-12,) 1000 MCG/ML injection Inject 1,000 mcg into the muscle every 30 (thirty) days.      fesoterodine (TOVIAZ) 4 MG TB24 tablet Take 4 mg by mouth at bedtime.    hydrocortisone (ANUSOL-HC) 25 MG suppository Place 25 mg rectally as needed.      hydroxypropyl methylcellulose (ISOPTO  TEARS) 2.5 % ophthalmic solution Place 1 drop into both eyes as needed for dry eyes.     ibuprofen (ADVIL,MOTRIN) 200 MG tablet Take 200 mg by mouth every 6 (six) hours as needed for moderate pain.     lansoprazole (PREVACID) 15 MG capsule Take 15 mg by mouth daily as needed.     levothyroxine (SYNTHROID, LEVOTHROID) 75 MCG tablet Take 75 mcg by mouth daily.      Multiple Vitamin (MULTIVITAMIN) capsule Take 1 capsule by mouth daily.      Multiple Vitamins-Minerals (ICAPS) CAPS Take 1 capsule by mouth daily.    nitroGLYCERIN (NITROSTAT) 0.4 MG SL tablet Place 0.4 mg under the tongue every 5 (five) minutes as needed.      Polyethyl Glycol-Propyl Glycol (SYSTANE) 0.4-0.3 % SOLN Apply to eye as directed.      polyethylene glycol (MIRALAX / GLYCOLAX) packet Take 17 g by mouth daily as needed for mild constipation.        No Known Allergies    The results of significant diagnostics from this hospitalization (including imaging, microbiology, ancillary and laboratory) are listed below for reference.    Significant Diagnostic Studies: Ct Head Wo Contrast  07/02/2014   CLINICAL DATA:  Fall.  Dizziness  EXAM: CT HEAD WITHOUT CONTRAST  CT CERVICAL SPINE WITHOUT CONTRAST  TECHNIQUE: Multidetector CT imaging of the head and cervical spine was performed following the standard protocol without intravenous contrast. Multiplanar CT image reconstructions of the cervical spine were also generated.  COMPARISON:  None.  FINDINGS: CT HEAD FINDINGS  Mild atrophy. Mild to moderate chronic microvascular ischemia in the white matter.  Negative for acute infarct. Negative for hemorrhage or mass. Negative for skull fracture.  CT CERVICAL SPINE FINDINGS  Negative for cervical spine fracture.  Normal alignment.  Disc spaces are normal. There is diffuse facet degeneration throughout the cervical spine.  IMPRESSION: Atrophy and chronic microvascular ischemia. No acute intracranial abnormality.  Negative for cervical  spine fracture.   Electronically Signed   By: Franchot Gallo M.D.   On: 07/02/2014 14:04   Ct Angio Chest Pe W/cm &/or Wo Cm  07/03/2014   CLINICAL DATA:  Chest pain ; syncope  EXAM: CT ANGIOGRAPHY CHEST WITH CONTRAST  TECHNIQUE: Multidetector CT imaging of the chest was performed using the standard protocol during bolus administration of intravenous contrast. Multiplanar CT image reconstructions and MIPs were obtained to evaluate the vascular anatomy.  CONTRAST:  170mL OMNIPAQUE IOHEXOL 350 MG/ML SOLN  COMPARISON:  Chest CT October 19, 2011; chest radiograph July 02, 2014  FINDINGS: There is no demonstrable pulmonary embolus. There is no thoracic aortic aneurysm or dissection.  There is underlying centrilobular emphysema. There is a moderate left pleural effusion with a rather minimal right pleural effusion. There is atelectatic change in the left lower lobe. There is chronic mild consolidation in the inferior lingula as well as in the anterior aspect of the medial segment of the right middle lobe. There is upper and lower  lobe bronchiectatic change bilaterally. There is no demonstrable pneumothorax.  On axial slice 39 series 7, there is a 5 mm nodular opacity in the superior segment of the left lower lobe. This finding was not apparent on prior CT. Several 2-3 mm nodular opacities in the left lower lobe appear stable compared to the prior study. There is stable parenchymal lung scarring with several 3-4 mm nodular opacities in the anterior and lateral segments of the left lower lobe which appear stable. There is a new 2 mm nodular opacity in the anterior segment of the right lower lobe seen on slice 65 series 7.  There is no appreciable thoracic adenopathy. The pericardium is not thickened. There is left ventricular hypertrophy.  In the visualized upper abdomen there is atherosclerotic change in the aorta.  There are no blastic or lytic bone lesions. Bones are somewhat osteoporotic. Visualized thyroid appears  diminutive but grossly normal.  Review of the MIP images confirms the above findings.  IMPRESSION: Bilateral pleural effusions, larger on the left than on the right. Underlying emphysema with areas of chronic cicatrization and bronchiectasis. There is no new infiltrate in the lungs.  There are several nodular opacities in the lungs, largest measuring 5 mm. Followup of these nodular opacity should be based on Fleischner Society guidelines. If the patient is at high risk for bronchogenic carcinoma, follow-up chest CT at 6-12 months is recommended. If the patient is at low risk for bronchogenic carcinoma, follow-up chest CT at 12 months is recommended. This recommendation follows the consensus statement: Guidelines for Management of Small Pulmonary Nodules Detected on CT Scans: A Statement from the [place] as published in Radiology 2005;237:395-400.  There is left ventricular hypertrophy.  No adenopathy appreciable.   Electronically Signed   By: Lowella Grip M.D.   On: 07/03/2014 10:14   Ct Cervical Spine Wo Contrast  07/02/2014   CLINICAL DATA:  Fall.  Dizziness  EXAM: CT HEAD WITHOUT CONTRAST  CT CERVICAL SPINE WITHOUT CONTRAST  TECHNIQUE: Multidetector CT imaging of the head and cervical spine was performed following the standard protocol without intravenous contrast. Multiplanar CT image reconstructions of the cervical spine were also generated.  COMPARISON:  None.  FINDINGS: CT HEAD FINDINGS  Mild atrophy. Mild to moderate chronic microvascular ischemia in the white matter.  Negative for acute infarct. Negative for hemorrhage or mass. Negative for skull fracture.  CT CERVICAL SPINE FINDINGS  Negative for cervical spine fracture.  Normal alignment.  Disc spaces are normal. There is diffuse facet degeneration throughout the cervical spine.  IMPRESSION: Atrophy and chronic microvascular ischemia. No acute intracranial abnormality.  Negative for cervical spine fracture.   Electronically Signed    By: Franchot Gallo M.D.   On: 07/02/2014 14:04   US Carotid Bilateral  07/03/2014   CLINICAL DATA:  Syncope  EXAM: BILATERAL CAROTID DUPLEX ULTRASOUND  TECHNIQUE: Pearline Cables scale imaging, color Doppler and duplex ultrasound were performed of bilateral carotid and vertebral arteries in the neck.  COMPARISON:  None.  FINDINGS: Criteria: Quantification of carotid stenosis is based on velocity parameters that correlate the residual internal carotid diameter with NASCET-based stenosis levels, using the diameter of the distal internal carotid lumen as the denominator for stenosis measurement.  The following velocity measurements were obtained:  RIGHT  ICA:  126 cm/sec  CCA:  026 cm/sec  SYSTOLIC ICA/CCA RATIO:  1  DIASTOLIC ICA/CCA RATIO:  ECA:  168 cm/sec  LEFT  ICA:  136 cm/sec  CCA:  378 cm/sec  SYSTOLIC  ICA/CCA RATIO:  1.1  DIASTOLIC ICA/CCA RATIO:  ECA:  96 cm/sec  RIGHT CAROTID ARTERY: Little if any plaque in the bulb. Low resistance internal carotid Doppler pattern. No significant tortuosity.  RIGHT VERTEBRAL ARTERY:  Antegrade with a normal Doppler pattern.  LEFT CAROTID ARTERY: Little if any plaque in the bulb. Low resistance internal carotid Doppler pattern. There is some calcified plaque in the proximal external carotid. Mild calcified plaque in the lower internal carotid. Normal-appearing low resistance internal carotid Doppler pattern.  LEFT VERTEBRAL ARTERY:  Antegrade with a normal Doppler pattern.  IMPRESSION: Less than 50% stenosis in the right and left internal carotid arteries. Peak systolic velocity measurements are slightly elevated of unknown significance.   Electronically Signed   By: Maryclare Bean M.D.   On: 07/03/2014 09:39   Dg Chest Portable 1 View  07/02/2014   CLINICAL DATA:  Syncope  EXAM: PORTABLE CHEST - 1 VIEW  COMPARISON:  Chest radiograph October 12, 2011 and chest CT October 19, 2011  FINDINGS: There is left base infiltrate. There is underlying emphysema. Elsewhere, lungs are clear. The heart  size and pulmonary vascularity are normal. No adenopathy. No bone lesions.  IMPRESSION: Left base infiltrate.  Underlying emphysema.   Electronically Signed   By: Lowella Grip M.D.   On: 07/02/2014 11:51    Microbiology: Recent Results (from the past 240 hour(s))  Culture, blood (routine x 2)     Status: None (Preliminary result)   Collection Time: 07/02/14  3:55 PM  Result Value Ref Range Status   Specimen Description BLOOD RIGHT HAND  Final   Special Requests BOTTLES DRAWN AEROBIC AND ANAEROBIC 8CC  Final   Culture NO GROWTH 1 DAY  Final   Report Status PENDING  Incomplete  Culture, blood (routine x 2)     Status: None (Preliminary result)   Collection Time: 07/02/14  4:05 PM  Result Value Ref Range Status   Specimen Description BLOOD LEFT ARM  Final   Special Requests BOTTLES DRAWN AEROBIC AND ANAEROBIC [place]  Final   Culture NO GROWTH 1 DAY  Final   Report Status PENDING  Incomplete     Labs: Basic Metabolic Panel:  Recent Labs Lab 07/02/14 1122 07/03/14 0526  NA 140 143  K 4.2 4.3  CL 98 105  CO2 31 29  GLUCOSE 105* 96  BUN 16 14  CREATININE 0.92 0.95  CALCIUM 9.2 8.5  MG 1.9  --   PHOS 3.3  --    Liver Function Tests: No results for input(s): AST, ALT, ALKPHOS, BILITOT, PROT, ALBUMIN in the last 168 hours. No results for input(s): LIPASE, AMYLASE in the last 168 hours. No results for input(s): AMMONIA in the last 168 hours. CBC:  Recent Labs Lab 07/02/14 1122 07/03/14 0526  WBC 13.7* 8.6  NEUTROABS 11.5*  --   HGB 13.5 11.9*  HCT 40.3 36.0  MCV 97.1 97.8  PLT 177 164   Cardiac Enzymes:  Recent Labs Lab 07/02/14 1122  TROPONINI <0.30   BNP: BNP (last 3 results) No results for input(s): PROBNP in the last 8760 hours. CBG: No results for input(s): GLUCAP in the last 168 hours.     Signed:  Velvet Bathe  [facility] 07/03/2014, 3:29 PM

## 2014-07-04 ENCOUNTER — Other Ambulatory Visit (HOSPITAL_COMMUNITY): Payer: Medicare Other

## 2014-07-07 LAB — CULTURE, BLOOD (ROUTINE X 2)
Culture: NO GROWTH
Culture: NO GROWTH

## 2014-07-10 NOTE — Discharge Summary (Signed)
Physician Discharge Summary  [name] MPN:[id number] DOB: 21-Sep-1926 DOA: 07/02/2014  PCP: [name], MD  Admit date: 07/02/2014 Discharge date: 07/10/2014  Time spent: > 35 minutes  Recommendations for Outpatient Follow-up:  1. CT angiogram reported multiple pulmonary nodules. Please follow-up as recommended by radiology depending on patient's history and risk for bronchogenic carcinoma.  2. There was initial concern for pulmonary infiltrate but CT angiogram did not report any infiltrates as such Levaquin be discontinued on discharge.  Discharge Diagnoses:  Principal Problem:   Syncope and collapse Active Problems:   Bronchiectasis without acute exacerbation   Osteoporosis   Discharge Condition: stable  Diet recommendation: heart healthy  Filed Weights   07/02/14 1112  Weight: 59.875 kg (132 lb)    History of present illness:  Patient is an 80 year old with history of COPD and osteoporosis who presented after syncopal episode. The patient reports she had taken a nitroglycerin tablet prior to syncope.  Hospital Course:  Syncope: - Workup negative. CT of the head reported no acute intracranial abnormality, CT angiogram of chest reported no PE. Did however make mention of pulmonary nodules. Please see report for details - Physical therapy did not recommend any PT follow-up - Ultrasound of carotids did not report any significant stricture - Patient reports that she had just gotten up and taken a nitroglycerin prior syncopal episode. As such I suspect orthostatic hypotension potentiated by medication. - Troponin negative and telemetry monitoring did not report any red flags  Procedures:  As listed below  Consultations:  None  Discharge Exam: Filed Vitals:   07/03/14 1601  BP: 160/68  Pulse: 74  Temp: 98.3 F (36.8 C)  Resp: 18    General: Pt in nad, alert and awake Cardiovascular: rrr, no mrg Respiratory: cta bl, no wheezes  Discharge  Instructions You were cared for by a hospitalist during your hospital stay. If you have any questions about your discharge medications or the care you received while you were in the hospital after you are discharged, you can call the unit and asked to speak with the hospitalist on call if the hospitalist that took care of you is not available. Once you are discharged, your primary care physician will handle any further medical issues. Please note that NO REFILLS for any discharge medications will be authorized once you are discharged, as it is imperative that you return to your primary care physician (or establish a relationship with a primary care physician if you do not have one) for your aftercare needs so that they can reassess your need for medications and monitor your lab values.  Discharge Instructions    Call MD for:  severe uncontrolled pain    Complete by:  As directed      Call MD for:  temperature >100.4    Complete by:  As directed      Diet - low sodium heart healthy    Complete by:  As directed      Increase activity slowly    Complete by:  As directed           Discharge Medication List as of 07/03/2014  4:12 PM    CONTINUE these medications which have NOT CHANGED   Details  albuterol (PROVENTIL HFA;VENTOLIN HFA) 108 (90 BASE) MCG/ACT inhaler Inhale 2 puffs into the lungs every 6 (six) hours as needed., Starting 04/20/2012, Until Discontinued, Normal    Carica Papaya (PAPAYA PO) Take 2-3 tablets by mouth daily as needed (digestion). , Until Discontinued, Historical  Med    Cholecalciferol (VITAMIN D3) 1000 UNITS CAPS Take 1 capsule by mouth daily.  , Until Discontinued, Historical Med    clorazepate (TRANXENE) 7.5 MG tablet Take 3.75 mg by mouth 2 (two) times daily. , Until Discontinued, Historical Med    cyanocobalamin (,VITAMIN B-12,) 1000 MCG/ML injection Inject 1,000 mcg into the muscle every 30 (thirty) days.  , Until Discontinued, Historical Med    fesoterodine  (TOVIAZ) 4 MG TB24 tablet Take 4 mg by mouth at bedtime., Until Discontinued, Historical Med    hydrocortisone (ANUSOL-HC) 25 MG suppository Place 25 mg rectally as needed.  , Until Discontinued, Historical Med    hydroxypropyl methylcellulose (ISOPTO TEARS) 2.5 % ophthalmic solution Place 1 drop into both eyes as needed for dry eyes. , Until Discontinued, Historical Med    ibuprofen (ADVIL,MOTRIN) 200 MG tablet Take 200 mg by mouth every 6 (six) hours as needed for moderate pain. , Until Discontinued, Historical Med    lansoprazole (PREVACID) 15 MG capsule Take 15 mg by mouth daily as needed. , Until Discontinued, Historical Med    levothyroxine (SYNTHROID, LEVOTHROID) 75 MCG tablet Take 75 mcg by mouth daily.  , Until Discontinued, Historical Med    Multiple Vitamin (MULTIVITAMIN) capsule Take 1 capsule by mouth daily.  , Until Discontinued, Historical Med    Multiple Vitamins-Minerals (ICAPS) CAPS Take 1 capsule by mouth daily., Until Discontinued, Historical Med    nitroGLYCERIN (NITROSTAT) 0.4 MG SL tablet Place 0.4 mg under the tongue every 5 (five) minutes as needed.  , Until Discontinued, Historical Med    Polyethyl Glycol-Propyl Glycol (SYSTANE) 0.4-0.3 % SOLN Apply to eye as directed.  , Until Discontinued, Historical Med    polyethylene glycol (MIRALAX / GLYCOLAX) packet Take 17 g by mouth daily as needed for mild constipation. , Until Discontinued, Historical Med       No Known Allergies    The results of significant diagnostics from this hospitalization (including imaging, microbiology, ancillary and laboratory) are listed below for reference.    Significant Diagnostic Studies: Ct Head Wo Contrast  07/02/2014   CLINICAL DATA:  Fall.  Dizziness  EXAM: CT HEAD WITHOUT CONTRAST  CT CERVICAL SPINE WITHOUT CONTRAST  TECHNIQUE: Multidetector CT imaging of the head and cervical spine was performed following the standard protocol without intravenous contrast. Multiplanar CT  image reconstructions of the cervical spine were also generated.  COMPARISON:  None.  FINDINGS: CT HEAD FINDINGS  Mild atrophy. Mild to moderate chronic microvascular ischemia in the white matter.  Negative for acute infarct. Negative for hemorrhage or mass. Negative for skull fracture.  CT CERVICAL SPINE FINDINGS  Negative for cervical spine fracture.  Normal alignment.  Disc spaces are normal. There is diffuse facet degeneration throughout the cervical spine.  IMPRESSION: Atrophy and chronic microvascular ischemia. No acute intracranial abnormality.  Negative for cervical spine fracture.   Electronically Signed   By: Franchot Gallo M.D.   On: 07/02/2014 14:04   Ct Angio Chest Pe W/cm &/or Wo Cm  07/03/2014   CLINICAL DATA:  Chest pain ; syncope  EXAM: CT ANGIOGRAPHY CHEST WITH CONTRAST  TECHNIQUE: Multidetector CT imaging of the chest was performed using the standard protocol during bolus administration of intravenous contrast. Multiplanar CT image reconstructions and MIPs were obtained to evaluate the vascular anatomy.  CONTRAST:  163mL OMNIPAQUE IOHEXOL 350 MG/ML SOLN  COMPARISON:  Chest CT October 19, 2011; chest radiograph July 02, 2014  FINDINGS: There is no demonstrable pulmonary embolus. There is  no thoracic aortic aneurysm or dissection.  There is underlying centrilobular emphysema. There is a moderate left pleural effusion with a rather minimal right pleural effusion. There is atelectatic change in the left lower lobe. There is chronic mild consolidation in the inferior lingula as well as in the anterior aspect of the medial segment of the right middle lobe. There is upper and lower lobe bronchiectatic change bilaterally. There is no demonstrable pneumothorax.  On axial slice 39 series 7, there is a 5 mm nodular opacity in the superior segment of the left lower lobe. This finding was not apparent on prior CT. Several 2-3 mm nodular opacities in the left lower lobe appear stable compared to the prior  study. There is stable parenchymal lung scarring with several 3-4 mm nodular opacities in the anterior and lateral segments of the left lower lobe which appear stable. There is a new 2 mm nodular opacity in the anterior segment of the right lower lobe seen on slice 65 series 7.  There is no appreciable thoracic adenopathy. The pericardium is not thickened. There is left ventricular hypertrophy.  In the visualized upper abdomen there is atherosclerotic change in the aorta.  There are no blastic or lytic bone lesions. Bones are somewhat osteoporotic. Visualized thyroid appears diminutive but grossly normal.  Review of the MIP images confirms the above findings.  IMPRESSION: Bilateral pleural effusions, larger on the left than on the right. Underlying emphysema with areas of chronic cicatrization and bronchiectasis. There is no new infiltrate in the lungs.  There are several nodular opacities in the lungs, largest measuring 5 mm. Followup of these nodular opacity should be based on Fleischner Society guidelines. If the patient is at high risk for bronchogenic carcinoma, follow-up chest CT at 6-12 months is recommended. If the patient is at low risk for bronchogenic carcinoma, follow-up chest CT at 12 months is recommended. This recommendation follows the consensus statement: Guidelines for Management of Small Pulmonary Nodules Detected on CT Scans: A Statement from the [employer] as published in Radiology 2005;237:395-400.  There is left ventricular hypertrophy.  No adenopathy appreciable.   Electronically Signed   By: Lowella Grip M.D.   On: 07/03/2014 10:14   Ct Cervical Spine Wo Contrast  07/02/2014   CLINICAL DATA:  Fall.  Dizziness  EXAM: CT HEAD WITHOUT CONTRAST  CT CERVICAL SPINE WITHOUT CONTRAST  TECHNIQUE: Multidetector CT imaging of the head and cervical spine was performed following the standard protocol without intravenous contrast. Multiplanar CT image reconstructions of the cervical spine  were also generated.  COMPARISON:  None.  FINDINGS: CT HEAD FINDINGS  Mild atrophy. Mild to moderate chronic microvascular ischemia in the white matter.  Negative for acute infarct. Negative for hemorrhage or mass. Negative for skull fracture.  CT CERVICAL SPINE FINDINGS  Negative for cervical spine fracture.  Normal alignment.  Disc spaces are normal. There is diffuse facet degeneration throughout the cervical spine.  IMPRESSION: Atrophy and chronic microvascular ischemia. No acute intracranial abnormality.  Negative for cervical spine fracture.   Electronically Signed   By: Franchot Gallo M.D.   On: 07/02/2014 14:04   US Carotid Bilateral  07/03/2014   CLINICAL DATA:  Syncope  EXAM: BILATERAL CAROTID DUPLEX ULTRASOUND  TECHNIQUE: Pearline Cables scale imaging, color Doppler and duplex ultrasound were performed of bilateral carotid and vertebral arteries in the neck.  COMPARISON:  None.  FINDINGS: Criteria: Quantification of carotid stenosis is based on velocity parameters that correlate the residual internal carotid diameter with NASCET-based  stenosis levels, using the diameter of the distal internal carotid lumen as the denominator for stenosis measurement.  The following velocity measurements were obtained:  RIGHT  ICA:  126 cm/sec  CCA:  149 cm/sec  SYSTOLIC ICA/CCA RATIO:  1  DIASTOLIC ICA/CCA RATIO:  ECA:  168 cm/sec  LEFT  ICA:  136 cm/sec  CCA:  702 cm/sec  SYSTOLIC ICA/CCA RATIO:  1.1  DIASTOLIC ICA/CCA RATIO:  ECA:  96 cm/sec  RIGHT CAROTID ARTERY: Little if any plaque in the bulb. Low resistance internal carotid Doppler pattern. No significant tortuosity.  RIGHT VERTEBRAL ARTERY:  Antegrade with a normal Doppler pattern.  LEFT CAROTID ARTERY: Little if any plaque in the bulb. Low resistance internal carotid Doppler pattern. There is some calcified plaque in the proximal external carotid. Mild calcified plaque in the lower internal carotid. Normal-appearing low resistance internal carotid Doppler pattern.  LEFT  VERTEBRAL ARTERY:  Antegrade with a normal Doppler pattern.  IMPRESSION: Less than 50% stenosis in the right and left internal carotid arteries. Peak systolic velocity measurements are slightly elevated of unknown significance.   Electronically Signed   By: Maryclare Bean M.D.   On: 07/03/2014 09:39   Dg Chest Portable 1 View  07/02/2014   CLINICAL DATA:  Syncope  EXAM: PORTABLE CHEST - 1 VIEW  COMPARISON:  Chest radiograph October 12, 2011 and chest CT October 19, 2011  FINDINGS: There is left base infiltrate. There is underlying emphysema. Elsewhere, lungs are clear. The heart size and pulmonary vascularity are normal. No adenopathy. No bone lesions.  IMPRESSION: Left base infiltrate.  Underlying emphysema.   Electronically Signed   By: Lowella Grip M.D.   On: 07/02/2014 11:51    Microbiology: Recent Results (from the past 240 hour(s))  Culture, blood (routine x 2)     Status: None   Collection Time: 07/02/14  3:55 PM  Result Value Ref Range Status   Specimen Description BLOOD RIGHT HAND  Final   Special Requests BOTTLES DRAWN AEROBIC AND ANAEROBIC 8CC  Final   Culture NO GROWTH 5 DAYS  Final   Report Status 07/07/2014 FINAL  Final  Culture, blood (routine x 2)     Status: None   Collection Time: 07/02/14  4:05 PM  Result Value Ref Range Status   Specimen Description BLOOD LEFT ARM  Final   Special Requests BOTTLES DRAWN AEROBIC AND ANAEROBIC [place]  Final   Culture NO GROWTH 5 DAYS  Final   Report Status 07/07/2014 FINAL  Final     Labs: Basic Metabolic Panel: No results for input(s): NA, K, CL, CO2, GLUCOSE, BUN, CREATININE, CALCIUM, MG, PHOS in the last 168 hours. Liver Function Tests: No results for input(s): AST, ALT, ALKPHOS, BILITOT, PROT, ALBUMIN in the last 168 hours. No results for input(s): LIPASE, AMYLASE in the last 168 hours. No results for input(s): AMMONIA in the last 168 hours. CBC: No results for input(s): WBC, NEUTROABS, HGB, HCT, MCV, PLT in the last 168  hours. Cardiac Enzymes: No results for input(s): CKTOTAL, CKMB, CKMBINDEX, TROPONINI in the last 168 hours. BNP: BNP (last 3 results) No results for input(s): PROBNP in the last 8760 hours. CBG: No results for input(s): GLUCAP in the last 168 hours.     Signed:  Velvet Bathe  [facility] 07/10/2014, 9:16 AM

## 2014-07-18 ENCOUNTER — Telehealth: Payer: Self-pay | Admitting: Family Medicine

## 2014-07-18 NOTE — Telephone Encounter (Signed)
Patient advised to call Dr. Forde Dandy and see if he will call her vitamin b12 in and let a family member give it to her at  Home.

## 2014-07-18 NOTE — Telephone Encounter (Signed)
Patient was notified that she would have to be a established patient here at our office in order to receive Vitamin b12 injections here.

## 2014-07-19 DIAGNOSIS — E538 Deficiency of other specified B group vitamins: Secondary | ICD-10-CM | POA: Diagnosis not present

## 2014-07-31 DIAGNOSIS — H04123 Dry eye syndrome of bilateral lacrimal glands: Secondary | ICD-10-CM | POA: Diagnosis not present

## 2014-07-31 DIAGNOSIS — H35342 Macular cyst, hole, or pseudohole, left eye: Secondary | ICD-10-CM | POA: Diagnosis not present

## 2014-08-07 DIAGNOSIS — H35342 Macular cyst, hole, or pseudohole, left eye: Secondary | ICD-10-CM | POA: Diagnosis not present

## 2014-08-07 DIAGNOSIS — H3531 Nonexudative age-related macular degeneration: Secondary | ICD-10-CM | POA: Diagnosis not present

## 2014-08-07 DIAGNOSIS — H35373 Puckering of macula, bilateral: Secondary | ICD-10-CM | POA: Diagnosis not present

## 2014-08-28 DIAGNOSIS — I2510 Atherosclerotic heart disease of native coronary artery without angina pectoris: Secondary | ICD-10-CM | POA: Diagnosis not present

## 2014-08-28 DIAGNOSIS — M810 Age-related osteoporosis without current pathological fracture: Secondary | ICD-10-CM | POA: Diagnosis not present

## 2014-08-28 DIAGNOSIS — J479 Bronchiectasis, uncomplicated: Secondary | ICD-10-CM | POA: Diagnosis not present

## 2014-08-28 DIAGNOSIS — Z6822 Body mass index (BMI) 22.0-22.9, adult: Secondary | ICD-10-CM | POA: Diagnosis not present

## 2014-08-28 DIAGNOSIS — E039 Hypothyroidism, unspecified: Secondary | ICD-10-CM | POA: Diagnosis not present

## 2014-08-28 DIAGNOSIS — N3281 Overactive bladder: Secondary | ICD-10-CM | POA: Diagnosis not present

## 2014-08-28 DIAGNOSIS — E785 Hyperlipidemia, unspecified: Secondary | ICD-10-CM | POA: Diagnosis not present

## 2014-08-28 DIAGNOSIS — M797 Fibromyalgia: Secondary | ICD-10-CM | POA: Diagnosis not present

## 2014-08-28 DIAGNOSIS — E538 Deficiency of other specified B group vitamins: Secondary | ICD-10-CM | POA: Diagnosis not present

## 2014-08-28 DIAGNOSIS — F418 Other specified anxiety disorders: Secondary | ICD-10-CM | POA: Diagnosis not present

## 2014-09-03 DIAGNOSIS — N390 Urinary tract infection, site not specified: Secondary | ICD-10-CM

## 2014-09-03 HISTORY — DX: Urinary tract infection, site not specified: N39.0

## 2014-09-21 ENCOUNTER — Emergency Department (HOSPITAL_COMMUNITY): Payer: Medicare Other

## 2014-09-21 ENCOUNTER — Encounter (HOSPITAL_COMMUNITY): Payer: Self-pay | Admitting: General Practice

## 2014-09-21 ENCOUNTER — Inpatient Hospital Stay (HOSPITAL_COMMUNITY)
Admission: EM | Admit: 2014-09-21 | Discharge: 2014-09-25 | DRG: 470 | Disposition: A | Discharge status: Skilled Nursing Facility | Payer: Medicare Other | Attending: Internal Medicine | Admitting: Internal Medicine

## 2014-09-21 DIAGNOSIS — I493 Ventricular premature depolarization: Secondary | ICD-10-CM | POA: Diagnosis present

## 2014-09-21 DIAGNOSIS — R262 Difficulty in walking, not elsewhere classified: Secondary | ICD-10-CM | POA: Diagnosis not present

## 2014-09-21 DIAGNOSIS — S72009A Fracture of unspecified part of neck of unspecified femur, initial encounter for closed fracture: Secondary | ICD-10-CM | POA: Diagnosis present

## 2014-09-21 DIAGNOSIS — E785 Hyperlipidemia, unspecified: Secondary | ICD-10-CM | POA: Diagnosis present

## 2014-09-21 DIAGNOSIS — E039 Hypothyroidism, unspecified: Secondary | ICD-10-CM | POA: Diagnosis present

## 2014-09-21 DIAGNOSIS — J449 Chronic obstructive pulmonary disease, unspecified: Secondary | ICD-10-CM | POA: Diagnosis not present

## 2014-09-21 DIAGNOSIS — S72001A Fracture of unspecified part of neck of right femur, initial encounter for closed fracture: Secondary | ICD-10-CM

## 2014-09-21 DIAGNOSIS — I5032 Chronic diastolic (congestive) heart failure: Secondary | ICD-10-CM | POA: Diagnosis present

## 2014-09-21 DIAGNOSIS — Y92099 Unspecified place in other non-institutional residence as the place of occurrence of the external cause: Secondary | ICD-10-CM | POA: Diagnosis not present

## 2014-09-21 DIAGNOSIS — R9431 Abnormal electrocardiogram [ECG] [EKG]: Secondary | ICD-10-CM | POA: Diagnosis not present

## 2014-09-21 DIAGNOSIS — M25551 Pain in right hip: Secondary | ICD-10-CM | POA: Diagnosis not present

## 2014-09-21 DIAGNOSIS — Y92009 Unspecified place in unspecified non-institutional (private) residence as the place of occurrence of the external cause: Secondary | ICD-10-CM

## 2014-09-21 DIAGNOSIS — W19XXXA Unspecified fall, initial encounter: Secondary | ICD-10-CM | POA: Diagnosis not present

## 2014-09-21 DIAGNOSIS — M797 Fibromyalgia: Secondary | ICD-10-CM | POA: Diagnosis present

## 2014-09-21 DIAGNOSIS — S72011A Unspecified intracapsular fracture of right femur, initial encounter for closed fracture: Secondary | ICD-10-CM | POA: Diagnosis present

## 2014-09-21 DIAGNOSIS — Z471 Aftercare following joint replacement surgery: Secondary | ICD-10-CM | POA: Diagnosis not present

## 2014-09-21 DIAGNOSIS — S72021A Displaced fracture of epiphysis (separation) (upper) of right femur, initial encounter for closed fracture: Secondary | ICD-10-CM | POA: Diagnosis not present

## 2014-09-21 DIAGNOSIS — E860 Dehydration: Secondary | ICD-10-CM | POA: Diagnosis present

## 2014-09-21 DIAGNOSIS — S7291XD Unspecified fracture of right femur, subsequent encounter for closed fracture with routine healing: Secondary | ICD-10-CM | POA: Diagnosis not present

## 2014-09-21 DIAGNOSIS — M21251 Flexion deformity, right hip: Secondary | ICD-10-CM | POA: Diagnosis not present

## 2014-09-21 DIAGNOSIS — J479 Bronchiectasis, uncomplicated: Secondary | ICD-10-CM | POA: Diagnosis present

## 2014-09-21 DIAGNOSIS — Z79899 Other long term (current) drug therapy: Secondary | ICD-10-CM

## 2014-09-21 DIAGNOSIS — R52 Pain, unspecified: Secondary | ICD-10-CM | POA: Diagnosis not present

## 2014-09-21 DIAGNOSIS — K5900 Constipation, unspecified: Secondary | ICD-10-CM | POA: Diagnosis not present

## 2014-09-21 DIAGNOSIS — M6281 Muscle weakness (generalized): Secondary | ICD-10-CM | POA: Diagnosis not present

## 2014-09-21 DIAGNOSIS — Z66 Do not resuscitate: Secondary | ICD-10-CM | POA: Diagnosis present

## 2014-09-21 DIAGNOSIS — J471 Bronchiectasis with (acute) exacerbation: Secondary | ICD-10-CM | POA: Diagnosis not present

## 2014-09-21 DIAGNOSIS — R278 Other lack of coordination: Secondary | ICD-10-CM | POA: Diagnosis not present

## 2014-09-21 DIAGNOSIS — Z96642 Presence of left artificial hip joint: Secondary | ICD-10-CM | POA: Diagnosis not present

## 2014-09-21 DIAGNOSIS — Z96641 Presence of right artificial hip joint: Secondary | ICD-10-CM

## 2014-09-21 DIAGNOSIS — R41841 Cognitive communication deficit: Secondary | ICD-10-CM | POA: Diagnosis not present

## 2014-09-21 LAB — URINALYSIS, ROUTINE W REFLEX MICROSCOPIC
Bilirubin Urine: NEGATIVE
GLUCOSE, UA: NEGATIVE mg/dL
Hgb urine dipstick: NEGATIVE
KETONES UR: 15 mg/dL — AB
LEUKOCYTES UA: NEGATIVE
Nitrite: NEGATIVE
PH: 6 (ref 5.0–8.0)
Protein, ur: NEGATIVE mg/dL
SPECIFIC GRAVITY, URINE: 1.013 (ref 1.005–1.030)
Urobilinogen, UA: 0.2 mg/dL (ref 0.0–1.0)

## 2014-09-21 LAB — CBC WITH DIFFERENTIAL/PLATELET
BASOS ABS: 0 10*3/uL (ref 0.0–0.1)
Basophils Relative: 0 % (ref 0–1)
EOS PCT: 1 % (ref 0–5)
Eosinophils Absolute: 0.1 10*3/uL (ref 0.0–0.7)
HEMATOCRIT: 40.1 % (ref 36.0–46.0)
HEMOGLOBIN: 13.3 g/dL (ref 12.0–15.0)
LYMPHS ABS: 1.3 10*3/uL (ref 0.7–4.0)
Lymphocytes Relative: 10 % — ABNORMAL LOW (ref 12–46)
MCH: 30.9 pg (ref 26.0–34.0)
MCHC: 33.2 g/dL (ref 30.0–36.0)
MCV: 93 fL (ref 78.0–100.0)
Monocytes Absolute: 0.9 10*3/uL (ref 0.1–1.0)
Monocytes Relative: 7 % (ref 3–12)
Neutro Abs: 9.9 10*3/uL — ABNORMAL HIGH (ref 1.7–7.7)
Neutrophils Relative %: 82 % — ABNORMAL HIGH (ref 43–77)
Platelets: 164 10*3/uL (ref 150–400)
RBC: 4.31 MIL/uL (ref 3.87–5.11)
RDW: 13.8 % (ref 11.5–15.5)
WBC: 12.1 10*3/uL — AB (ref 4.0–10.5)

## 2014-09-21 LAB — BASIC METABOLIC PANEL
Anion gap: 7 (ref 5–15)
BUN: 16 mg/dL (ref 6–23)
CO2: 28 mmol/L (ref 19–32)
CREATININE: 0.9 mg/dL (ref 0.50–1.10)
Calcium: 8.5 mg/dL (ref 8.4–10.5)
Chloride: 101 mmol/L (ref 96–112)
GFR, EST AFRICAN AMERICAN: 66 mL/min — AB (ref >90)
GFR, EST NON AFRICAN AMERICAN: 57 mL/min — AB (ref >90)
GLUCOSE: 108 mg/dL — AB (ref 70–99)
Potassium: 3.5 mmol/L (ref 3.5–5.1)
SODIUM: 136 mmol/L (ref 135–145)

## 2014-09-21 LAB — TYPE AND SCREEN
ABO/RH(D): O POS
ANTIBODY SCREEN: NEGATIVE

## 2014-09-21 LAB — ABO/RH: ABO/RH(D): O POS

## 2014-09-21 LAB — PROTIME-INR
INR: 1.1 (ref 0.00–1.49)
Prothrombin Time: 14.3 seconds (ref 11.6–15.2)

## 2014-09-21 LAB — TSH: TSH: 1.543 u[IU]/mL (ref 0.350–4.500)

## 2014-09-21 MED ORDER — LEVOTHYROXINE SODIUM 75 MCG PO TABS
75.0000 ug | ORAL_TABLET | Freq: Every day | ORAL | Status: DC
  Administered 2014-09-22 – 2014-09-25 (×4): 75 ug via ORAL
  Filled 2014-09-21 (×5): qty 1

## 2014-09-21 MED ORDER — DOCUSATE SODIUM 100 MG PO CAPS
100.0000 mg | ORAL_CAPSULE | Freq: Two times a day (BID) | ORAL | Status: DC
  Administered 2014-09-21: 100 mg via ORAL
  Filled 2014-09-21 (×3): qty 1

## 2014-09-21 MED ORDER — SODIUM CHLORIDE 0.9 % IV SOLN
INTRAVENOUS | Status: DC
  Administered 2014-09-21: 17:00:00 via INTRAVENOUS

## 2014-09-21 MED ORDER — OXYCODONE HCL 5 MG PO TABS
5.0000 mg | ORAL_TABLET | ORAL | Status: DC | PRN
  Administered 2014-09-21 – 2014-09-24 (×7): 5 mg via ORAL
  Filled 2014-09-21 (×6): qty 1

## 2014-09-21 MED ORDER — ALUM & MAG HYDROXIDE-SIMETH 200-200-20 MG/5ML PO SUSP: 30.0000 mL | Freq: Four times a day (QID) | ORAL | Status: DC | PRN

## 2014-09-21 MED ORDER — ONDANSETRON HCL 4 MG PO TABS: 4.0000 mg | ORAL_TABLET | Freq: Four times a day (QID) | ORAL | Status: DC | PRN

## 2014-09-21 MED ORDER — ONDANSETRON HCL 4 MG/2ML IJ SOLN
4.0000 mg | Freq: Once | INTRAMUSCULAR | Status: DC
  Filled 2014-09-21: qty 2

## 2014-09-21 MED ORDER — ENOXAPARIN SODIUM 40 MG/0.4ML ~~LOC~~ SOLN
40.0000 mg | SUBCUTANEOUS | Status: DC
  Administered 2014-09-21: 40 mg via SUBCUTANEOUS
  Filled 2014-09-21 (×2): qty 0.4

## 2014-09-21 MED ORDER — HYDROMORPHONE HCL 1 MG/ML IJ SOLN: 1.0000 mg | INTRAMUSCULAR | Status: DC | PRN

## 2014-09-21 MED ORDER — POLYETHYL GLYCOL-PROPYL GLYCOL 0.4-0.3 % OP SOLN: OPHTHALMIC | Status: DC

## 2014-09-21 MED ORDER — MORPHINE SULFATE 2 MG/ML IJ SOLN: 1.0000 mg | INTRAMUSCULAR | Status: DC | PRN

## 2014-09-21 MED ORDER — HYPROMELLOSE (GONIOSCOPIC) 2.5 % OP SOLN: 1.0000 [drp] | OPHTHALMIC | Status: DC | PRN

## 2014-09-21 MED ORDER — ONDANSETRON HCL 4 MG/2ML IJ SOLN: 4.0000 mg | Freq: Four times a day (QID) | INTRAMUSCULAR | Status: DC | PRN

## 2014-09-21 MED ORDER — ACETAMINOPHEN 325 MG PO TABS: 650.0000 mg | ORAL_TABLET | Freq: Four times a day (QID) | ORAL | Status: DC | PRN

## 2014-09-21 MED ORDER — ONDANSETRON HCL 4 MG/2ML IJ SOLN: 4.0000 mg | Freq: Three times a day (TID) | INTRAMUSCULAR | Status: DC | PRN

## 2014-09-21 MED ORDER — ACETAMINOPHEN 650 MG RE SUPP: 650.0000 mg | Freq: Four times a day (QID) | RECTAL | Status: DC | PRN

## 2014-09-21 MED ORDER — POLYETHYLENE GLYCOL 3350 17 G PO PACK
17.0000 g | PACK | Freq: Every day | ORAL | Status: DC | PRN
  Filled 2014-09-21: qty 1

## 2014-09-21 MED ORDER — MORPHINE SULFATE 4 MG/ML IJ SOLN
4.0000 mg | Freq: Once | INTRAMUSCULAR | Status: DC
  Filled 2014-09-21: qty 1

## 2014-09-21 NOTE — Consult Note (Signed)
Melinda Stack, MD Chief Complaint: Right hip fracture History: Elderly woman who fell yesterday.  Today she noted acute pain and inability to weight bear.  Brought to ER for further evaluation.   Past Medical History  Diagnosis Date  . Hyperlipidemia   . Irregular heartbeat   . Arthritis   . Fibromyalgia   . CHF (congestive heart failure)   . Coronary artery disease   . Hiatal hernia   . COPD (chronic obstructive pulmonary disease)   . Osteoporosis   . Anxiety   . Depression   . Rotator cuff tear     RIGHT    No Known Allergies  No current facility-administered medications on file prior to encounter.   Current Outpatient Prescriptions on File Prior to Encounter  Medication Sig Dispense Refill  . albuterol (PROVENTIL HFA;VENTOLIN HFA) 108 (90 BASE) MCG/ACT inhaler Inhale 2 puffs into the lungs every 6 (six) hours as needed. 1 Inhaler 5  . Carica Papaya (PAPAYA PO) Take 2-3 tablets by mouth daily as needed (digestion).     . Cholecalciferol (VITAMIN D3) 1000 UNITS CAPS Take 1 capsule by mouth daily.      . clorazepate (TRANXENE) 7.5 MG tablet Take 3.75 mg by mouth 2 (two) times daily.     . cyanocobalamin (,VITAMIN B-12,) 1000 MCG/ML injection Inject 1,000 mcg into the muscle every 30 (thirty) days.      . fesoterodine (TOVIAZ) 4 MG TB24 tablet Take 4 mg by mouth at bedtime.    . hydrocortisone (ANUSOL-HC) 25 MG suppository Place 25 mg rectally as needed.      . hydroxypropyl methylcellulose (ISOPTO TEARS) 2.5 % ophthalmic solution Place 1 drop into both eyes as needed for dry eyes.     Marland Kitchen ibuprofen (ADVIL,MOTRIN) 200 MG tablet Take 200 mg by mouth every 6 (six) hours as needed for moderate pain.     Marland Kitchen lansoprazole (PREVACID) 15 MG capsule Take 15 mg by mouth daily as needed.     Marland Kitchen levothyroxine (SYNTHROID, LEVOTHROID) 75 MCG tablet Take 75 mcg by mouth daily.      . Multiple Vitamin (MULTIVITAMIN) capsule Take 1 capsule by mouth daily.      . Multiple Vitamins-Minerals  (ICAPS) CAPS Take 1 capsule by mouth daily.    . nitroGLYCERIN (NITROSTAT) 0.4 MG SL tablet Place 0.4 mg under the tongue every 5 (five) minutes as needed.      Vladimir Faster Glycol-Propyl Glycol (SYSTANE) 0.4-0.3 % SOLN Apply to eye as directed.      . polyethylene glycol (MIRALAX / GLYCOLAX) packet Take 17 g by mouth daily as needed for mild constipation.       Physical Exam: Filed Vitals:   09/21/14 1400  BP: 170/67  Pulse: 95  Temp:   Resp: 24  A+O x3 NVI  Compartments soft/nt No sob/cp abd soft/nt Right hip pain no laceration    Image: Dg Chest 2 View  09/21/2014   CLINICAL DATA:  Right hip pain  EXAM: CHEST  2 VIEW  COMPARISON:  07/03/2014  FINDINGS: Normal heart size. There is no pleural effusion identified. No airspace consolidation. The lungs are hyperinflated and there are coarsened interstitial markings noted bilaterally. Bronchiectasis and scarring involving the right middle lobe and lingular portion the left lung are noted. The bones appear osteopenic and there is a scoliosis deformity involving the thoracic spine.  IMPRESSION: 1. No acute cardiopulmonary abnormalities. 2. Chronic interstitial changes.   Electronically Signed   By: Kerby Moors M.D.   On:  09/21/2014 12:37   Dg Hip Unilat With Pelvis 2-3 Views Right  09/21/2014   CLINICAL DATA:  Hip pain  EXAM: RIGHT HIP (WITH PELVIS) 2-3 VIEWS  COMPARISON:  None  FINDINGS: There is a right subcapital femoral neck fracture. There is displacement of the distal fracture fragments proximally. Left hip appears located and intact.  IMPRESSION: Acute right subcapital femoral neck fracture.   Electronically Signed   By: Kerby Moors M.D.   On: 09/21/2014 12:38    A/P:  Elderly woman s/p fall yesterday with acute worsening pain and inability to ambulate xrays demonstrate displaced femoral neck fracture right side No focal neuro deficits Plan on hemiarthroplasty Will discuss with my partner for plan on definitive fracture  management Await medical eval and clearance If patient cleared will plan on surgery Saturday.

## 2014-09-21 NOTE — H&P (Signed)
[facility] History and Physical  [name] ZDG:[id number] DOB: [birth year] DOA: 09/21/2014  Referring physician:  PCP: [name], MD   Chief Complaint: Right hip pain  HPI: [name] is a 81 y.o. female with a past medical history of chronic obstructive disease, chronic diastolic congestive heart failure, dyslipidemia who presents to the emergent department with complaints of right hip pain. She reports that 2 nights ago she lost her balance while kissing her fianc good night, falling backwards landing on her right hip on a hardwood floor. She reports experiencing severe right hip pain however got up and limped to her bed. She denied shortness of breath, palpitations, dizziness, lightheadedness, loss of consciousness associated with her fall. Patient reporting having ongoing severe right hip pain over the course of the day yesterday however stated that pain became unbearable for which she presented to the emergency room. Imaging studies showing a displaced femoral neck fracture on right side. Patient was seen and evaluated by Dr. Rolena Infante of orthopedic surgery, plan for right hemiarthroplasty in a.m.                                                          Review of Systems:  Constitutional:  No weight loss, night sweats, Fevers, chills, fatigue.  HEENT:  No headaches, Difficulty swallowing,Tooth/dental problems,Sore throat,  No sneezing, itching, ear ache, nasal congestion, post nasal drip,  Cardio-vascular:  No chest pain, Orthopnea, PND, swelling in lower extremities, anasarca, dizziness, palpitations  GI:  No heartburn, indigestion, abdominal pain, nausea, vomiting, diarrhea, change in bowel habits, loss of appetite  Resp:  No shortness of breath with exertion or at rest. No excess mucus, no productive cough, No non-productive cough, No coughing up of blood.No change in color of mucus.No wheezing.No chest wall deformity  Skin:  no rash or lesions.  GU:  no  dysuria, change in color of urine, no urgency or frequency. No flank pain.  Musculoskeletal:  No joint pain or swelling. No decreased range of motion.  Positive for significant right hip pain Psych:  No change in mood or affect. No depression or anxiety. No memory loss.   Past Medical History  Diagnosis Date  . Hyperlipidemia   . Irregular heartbeat   . Arthritis   . Fibromyalgia   . CHF (congestive heart failure)   . Coronary artery disease   . Hiatal hernia   . COPD (chronic obstructive pulmonary disease)   . Osteoporosis   . Anxiety   . Depression   . Rotator cuff tear     RIGHT   Past Surgical History  Procedure Laterality Date  . Appendectomy    . Dilation and curettage of uterus      x2  . Elbow surgery      Right  . Cataract extraction      Left   Social History:  reports that she has never smoked. She has never used smokeless tobacco. She reports that she does not drink alcohol. Her drug history is not on file.  Allergies  Allergen Reactions  . Fish Allergy Nausea And Vomiting    Extremely sick    Family History  Problem Relation Age of Onset  . Pancreatic cancer Father   . Heart failure Mother   . Hypertension Mother   . Heart attack  Brother   . Hypertension Brother   . Stroke Brother   . Heart attack Sister   . Diabetes Son   . Obesity Son   . Heart attack Son     Prior to Admission medications   Medication Sig Start Date End Date Taking? Authorizing Provider  albuterol (PROVENTIL HFA;VENTOLIN HFA) 108 (90 BASE) MCG/ACT inhaler Inhale 2 puffs into the lungs every 6 (six) hours as needed. 04/20/12  Yes Kathee Delton, MD  Carica Papaya (PAPAYA PO) Take 2-3 tablets by mouth daily as needed (digestion).    Yes Historical Provider, MD  Cholecalciferol (VITAMIN D3) 1000 UNITS CAPS Take 1 capsule by mouth daily.     Yes Historical Provider, MD  clorazepate (TRANXENE) 7.5 MG tablet Take 3.75 mg by mouth 2 (two) times daily.    Yes Historical Provider, MD    cyanocobalamin (,VITAMIN B-12,) 1000 MCG/ML injection Inject 1,000 mcg into the muscle every 30 (thirty) days.     Yes Historical Provider, MD  fesoterodine (TOVIAZ) 4 MG TB24 tablet Take 4 mg by mouth at bedtime.   Yes Historical Provider, MD  hydroxypropyl methylcellulose (ISOPTO TEARS) 2.5 % ophthalmic solution Place 1 drop into both eyes as needed for dry eyes.    Yes Historical Provider, MD  levothyroxine (SYNTHROID, LEVOTHROID) 75 MCG tablet Take 75 mcg by mouth daily.     Yes Historical Provider, MD  Multiple Vitamin (MULTIVITAMIN) capsule Take 1 capsule by mouth daily.     Yes Historical Provider, MD  nitroGLYCERIN (NITROSTAT) 0.4 MG SL tablet Place 0.4 mg under the tongue every 5 (five) minutes as needed.     Yes Historical Provider, MD  Polyethyl Glycol-Propyl Glycol (SYSTANE) 0.4-0.3 % SOLN Apply to eye as directed.     Yes Historical Provider, MD  polyethylene glycol (MIRALAX / GLYCOLAX) packet Take 17 g by mouth daily as needed for mild constipation.    Yes Historical Provider, MD  hydrocortisone (ANUSOL-HC) 25 MG suppository Place 25 mg rectally as needed.      Historical Provider, MD  lansoprazole (PREVACID) 15 MG capsule Take 15 mg by mouth daily as needed.     Historical Provider, MD   Physical Exam: Filed Vitals:   09/21/14 1330 09/21/14 1345 09/21/14 1400 09/21/14 1415  BP: 164/93 171/90 170/67   Pulse: 90 95 95   Temp:      TempSrc:      Resp: 13 15 24 15   Height:      Weight:      SpO2: 97% 97% 96%     Wt Readings from Last 3 Encounters:  09/21/14 58.06 kg (128 lb)  07/02/14 59.875 kg (132 lb)  05/09/14 62.596 kg (138 lb)    General:  Appears calm and comfortable, in no acute distress.  Eyes: PERRL, normal lids, irises & conjunctiva ENT: grossly normal hearing, lips & tongue Neck: no LAD, masses or thyromegaly Cardiovascular: RRR, no m/r/g. No LE edema. Telemetry: SR, no arrhythmias  Respiratory: CTA bilaterally, no w/r/r. Normal respiratory  effort. Abdomen: soft, ntnd Skin: no rash or induration seen on limited exam Musculoskeletal:  Her right lower extremity is externally rotated and shorter compared to left lower extremity, there is pain with passive or active movement to the right lower extremity. Psychiatric: grossly normal mood and affect, speech fluent and appropriate Neurologic: grossly non-focal.          Labs on Admission:  Basic Metabolic Panel:  Recent Labs Lab 09/21/14 1253  NA 136  K  3.5  CL 101  CO2 28  GLUCOSE 108*  BUN 16  CREATININE 0.90  CALCIUM 8.5   Liver Function Tests: No results for input(s): AST, ALT, ALKPHOS, BILITOT, PROT, ALBUMIN in the last 168 hours. No results for input(s): LIPASE, AMYLASE in the last 168 hours. No results for input(s): AMMONIA in the last 168 hours. CBC:  Recent Labs Lab 09/21/14 1253  WBC 12.1*  NEUTROABS 9.9*  HGB 13.3  HCT 40.1  MCV 93.0  PLT 164   Cardiac Enzymes: No results for input(s): CKTOTAL, CKMB, CKMBINDEX, TROPONINI in the last 168 hours.  BNP (last 3 results) No results for input(s): BNP in the last 8760 hours.  ProBNP (last 3 results) No results for input(s): PROBNP in the last 8760 hours.  CBG: No results for input(s): GLUCAP in the last 168 hours.  Radiological Exams on Admission: Dg Chest 2 View  09/21/2014   CLINICAL DATA:  Right hip pain  EXAM: CHEST  2 VIEW  COMPARISON:  07/03/2014  FINDINGS: Normal heart size. There is no pleural effusion identified. No airspace consolidation. The lungs are hyperinflated and there are coarsened interstitial markings noted bilaterally. Bronchiectasis and scarring involving the right middle lobe and lingular portion the left lung are noted. The bones appear osteopenic and there is a scoliosis deformity involving the thoracic spine.  IMPRESSION: 1. No acute cardiopulmonary abnormalities. 2. Chronic interstitial changes.   Electronically Signed   By: Kerby Moors M.D.   On: 09/21/2014 12:37   Dg  Hip Unilat With Pelvis 2-3 Views Right  09/21/2014   CLINICAL DATA:  Hip pain  EXAM: RIGHT HIP (WITH PELVIS) 2-3 VIEWS  COMPARISON:  None  FINDINGS: There is a right subcapital femoral neck fracture. There is displacement of the distal fracture fragments proximally. Left hip appears located and intact.  IMPRESSION: Acute right subcapital femoral neck fracture.   Electronically Signed   By: Kerby Moors M.D.   On: 09/21/2014 12:38    EKG: Independently reviewed. EKG showing sinus rhythm  Assessment/Plan Principal Problem:   Hip fracture Active Problems:   Bronchiectasis without acute exacerbation   Fall at home   1. Right displaced femoral neck fracture. Patient having a mechanical fall at home several days ago, landing on hard wood floor. Due to the presence of significant right hip pain she presented to the emergency department where imaging studies revealed a right femoral neck fracture. She was seen and evaluated by Dr. Rolena Infante of orthopedic surgery. She denied any symptoms prior to the fall, there was no loss of consciousness. Prior to this event she reports doing well. At baseline she is highly functional, resides in the community, is able to perform all activities of daily living. She reports having some shortness of breath at baseline. EKG did not show acute ischemic changes. Chest x-ray did not reveal acute cardiopulmonary abnormalities. Lab work reviewed, kidney function and electrolytes intact. She does not appear to have active cardiopulmonary issues at this time. Will advance her diet, make her nothing by mouth after midnight. Patient at least an intermediate surgical risk given advanced age and comorbidities. 2. Hypothyroidism. Will check a TSH meanwhile continue her Synthroid at 75 g by mouth daily 3. History of bronchiectasis. She appears to be stable from a respiratory standpoint. Chest x-ray did not show acute cardiopulmonary process. Continue supportive care. 4. Status post fall  at home. Appears to be mechanical, patient denies symptoms prior to fall, attributing fall to loss of balance.  Code Status:  DNR. I spoke with her regarding advance care planning, she would not want to undergo cardiopulmonary resuscitation in the event of CODE BLUE.  Family Communication: I spoke to her fianc who was present at bedside Disposition Plan: Anticipate patient will require greater than 2 nights hospitalization, admitted to the inpatient service  Time spent: 70 min  Kelvin Cellar [facility] Pager [phone]

## 2014-09-21 NOTE — ED Provider Notes (Signed)
CSN: [id number]     Arrival date & time 09/21/14  1121 History   First MD Initiated Contact with Patient 09/21/14 1132     Chief Complaint  Patient presents with  . Hip Pain     (Consider location/radiation/quality/duration/timing/severity/associated sxs/prior Treatment) HPI   [name] is a(n) 81 y.o. female who presents  With cc of Left hip and groin pain. The patient had a mechanical fall 2 days ago. She has been cared for at home by her fiance but has not been able to ambulate since the fall. She has been cared for at home by her fianc who has been letting her use a bedpan. This morning she began having radiating pain to her groin was unable to bear the pain and came into the emergency department. She has a past medical history of hyperlipidemia, arrhythmia, fibromyalgia, CHF, CAD, COPD,. Patient states that she is "not supposed to get anesthesia." She has a history of osteoporosis and is followed by [name] at [facility] orthopedics. Patient denies numbness or tingling. She complains of severe right hip pain. She has hitting her head or losing consciousness  Past Medical History  Diagnosis Date  . Hyperlipidemia   . Irregular heartbeat   . Arthritis   . Fibromyalgia   . CHF (congestive heart failure)   . Coronary artery disease   . Hiatal hernia   . COPD (chronic obstructive pulmonary disease)   . Osteoporosis   . Anxiety   . Depression   . Rotator cuff tear     RIGHT   Past Surgical History  Procedure Laterality Date  . Appendectomy    . Dilation and curettage of uterus      x2  . Elbow surgery      Right  . Cataract extraction      Left   Family History  Problem Relation Age of Onset  . Pancreatic cancer Father   . Heart failure Mother   . Hypertension Mother   . Heart attack Brother   . Hypertension Brother   . Stroke Brother   . Heart attack Sister   . Diabetes Son   . Obesity Son   . Heart attack Son    History  Substance Use Topics  .  Smoking status: Never Smoker   . Smokeless tobacco: Never Used  . Alcohol Use: No   OB History    Gravida Para Term Preterm AB TAB SAB Ectopic Multiple Living   1 1        1      Review of Systems  Ten systems reviewed and are negative for acute change, except as noted in the HPI.    Allergies  Fish allergy  Home Medications   Prior to Admission medications   Medication Sig Start Date End Date Taking? Authorizing Provider  albuterol (PROVENTIL HFA;VENTOLIN HFA) 108 (90 BASE) MCG/ACT inhaler Inhale 2 puffs into the lungs every 6 (six) hours as needed. 04/20/12  Yes Kathee Delton, MD  Carica Papaya (PAPAYA PO) Take 2-3 tablets by mouth daily as needed (digestion).    Yes Historical Provider, MD  Cholecalciferol (VITAMIN D3) 1000 UNITS CAPS Take 1 capsule by mouth daily.     Yes Historical Provider, MD  clorazepate (TRANXENE) 7.5 MG tablet Take 3.75 mg by mouth 2 (two) times daily.    Yes Historical Provider, MD  cyanocobalamin (,VITAMIN B-12,) 1000 MCG/ML injection Inject 1,000 mcg into the muscle every 30 (thirty) days.     Yes Historical  Provider, MD  fesoterodine (TOVIAZ) 4 MG TB24 tablet Take 4 mg by mouth at bedtime.   Yes Historical Provider, MD  hydroxypropyl methylcellulose (ISOPTO TEARS) 2.5 % ophthalmic solution Place 1 drop into both eyes as needed for dry eyes.    Yes Historical Provider, MD  levothyroxine (SYNTHROID, LEVOTHROID) 75 MCG tablet Take 75 mcg by mouth daily.     Yes Historical Provider, MD  Multiple Vitamin (MULTIVITAMIN) capsule Take 1 capsule by mouth daily.     Yes Historical Provider, MD  nitroGLYCERIN (NITROSTAT) 0.4 MG SL tablet Place 0.4 mg under the tongue every 5 (five) minutes as needed.     Yes Historical Provider, MD  Polyethyl Glycol-Propyl Glycol (SYSTANE) 0.4-0.3 % SOLN Apply to eye as directed.     Yes Historical Provider, MD  polyethylene glycol (MIRALAX / GLYCOLAX) packet Take 17 g by mouth daily as needed for mild constipation.    Yes  Historical Provider, MD  hydrocortisone (ANUSOL-HC) 25 MG suppository Place 25 mg rectally as needed.      Historical Provider, MD  lansoprazole (PREVACID) 15 MG capsule Take 15 mg by mouth daily as needed.     Historical Provider, MD   BP 179/81 mmHg  Pulse 89  Temp(Src) 98 F (36.7 C) (Oral)  Resp 20  Ht 5\' 7"  (1.702 m)  Wt 1327 lb 9.8 oz (602.2 kg)  BMI 207.88 kg/m2  SpO2 90% Physical Exam  Constitutional: She is oriented to person, place, and time. She appears well-developed and well-nourished. No distress.  HENT:  Head: Normocephalic and atraumatic.  Eyes: Conjunctivae are normal. No scleral icterus.  Neck: Normal range of motion.  Cardiovascular: Normal rate, regular rhythm and normal heart sounds.  Exam reveals no gallop and no friction rub.   No murmur heard. Pulmonary/Chest: Effort normal and breath sounds normal. No respiratory distress.  Abdominal: Soft. Bowel sounds are normal. She exhibits no distension and no mass. There is no tenderness. There is no guarding.  Musculoskeletal:       Legs: Neurological: She is alert and oriented to person, place, and time.  Skin: Skin is warm and dry. She is not diaphoretic.  Nursing note and vitals reviewed.   ED Course  Procedures (including critical care time) Labs Review Labs Reviewed  BASIC METABOLIC PANEL - Abnormal; Notable for the following:    Glucose, Bld 108 (*)    GFR calc non Af Amer 57 (*)    GFR calc Af Amer 66 (*)    All other components within normal limits  CBC WITH DIFFERENTIAL/PLATELET - Abnormal; Notable for the following:    WBC 12.1 (*)    Neutrophils Relative % 82 (*)    Neutro Abs 9.9 (*)    Lymphocytes Relative 10 (*)    All other components within normal limits  URINALYSIS, ROUTINE W REFLEX MICROSCOPIC - Abnormal; Notable for the following:    Ketones, ur 15 (*)    All other components within normal limits  PROTIME-INR  CBC  CREATININE, SERUM  TSH  TYPE AND SCREEN  ABO/RH    Imaging  Review Dg Chest 2 View  09/21/2014   CLINICAL DATA:  Right hip pain  EXAM: CHEST  2 VIEW  COMPARISON:  07/03/2014  FINDINGS: Normal heart size. There is no pleural effusion identified. No airspace consolidation. The lungs are hyperinflated and there are coarsened interstitial markings noted bilaterally. Bronchiectasis and scarring involving the right middle lobe and lingular portion the left lung are noted. The bones appear  osteopenic and there is a scoliosis deformity involving the thoracic spine.  IMPRESSION: 1. No acute cardiopulmonary abnormalities. 2. Chronic interstitial changes.   Electronically Signed   By: Kerby Moors M.D.   On: 09/21/2014 12:37   Dg Hip Unilat With Pelvis 2-3 Views Right  09/21/2014   CLINICAL DATA:  Hip pain  EXAM: RIGHT HIP (WITH PELVIS) 2-3 VIEWS  COMPARISON:  None  FINDINGS: There is a right subcapital femoral neck fracture. There is displacement of the distal fracture fragments proximally. Left hip appears located and intact.  IMPRESSION: Acute right subcapital femoral neck fracture.   Electronically Signed   By: Kerby Moors M.D.   On: 09/21/2014 12:38     EKG Interpretation None      MDM   Final diagnoses:  Fall  Hip fracture, right, closed, initial encounter    3:51 PM BP 179/81 mmHg  Pulse 89  Temp(Src) 98 F (36.7 C) (Oral)  Resp 20  Ht 5\' 7"  (1.702 m)  Wt 1327 lb 9.8 oz (602.2 kg)  BMI 207.88 kg/m2  SpO2 90% Patient with R hip Femoral neck fracture. I have ordered morphine, however the patient has refused pain medications currently. She is not on any blood thinners.    3:52 PM BP 179/81 mmHg  Pulse 89  Temp(Src) 98 F (36.7 C) (Oral)  Resp 20  Ht 5\' 7"  (1.702 m)  Wt 1327 lb 9.8 oz (602.2 kg)  BMI 207.88 kg/m2  SpO2 90% Patient with R hip fracture. Dr. Rolena Infante is aware and will consult. Patient admitted by Dr. Coralyn Pear.  Labs appear wnl. HDS.    Margarita Mail, PA-C 09/21/14 [place] Alvino Chapel, MD 09/25/14 [phone]

## 2014-09-21 NOTE — ED Notes (Signed)
Pt transported via EMS. With complaints of a fall 2 days ago on her right hip. Pt complaining of right hip and right groin pain. Pt presents with bruising on right arm, and arm buttocks. Pain score a 8/10. Pt right leg is outwardly rotated and shorter than left. Pt is A/O. Right hip and groin area is swollen. EMS vital signs B/P 168/72, HR 88, SpO2 95% on R/A, CBG 124, temp 96.4.

## 2014-09-21 NOTE — ED Notes (Signed)
Pt in X ray

## 2014-09-21 NOTE — ED Notes (Signed)
[facility] at bedside  

## 2014-09-22 ENCOUNTER — Inpatient Hospital Stay (HOSPITAL_COMMUNITY): Payer: Medicare Other | Admitting: Certified Registered"

## 2014-09-22 ENCOUNTER — Inpatient Hospital Stay (HOSPITAL_COMMUNITY): Payer: Medicare Other

## 2014-09-22 ENCOUNTER — Encounter (HOSPITAL_COMMUNITY)
Admission: EM | Disposition: A | Discharge status: Skilled Nursing Facility | Payer: Self-pay | Source: Home / Self Care | Attending: Internal Medicine

## 2014-09-22 DIAGNOSIS — E039 Hypothyroidism, unspecified: Secondary | ICD-10-CM

## 2014-09-22 HISTORY — PX: HIP ARTHROPLASTY: SHX981

## 2014-09-22 LAB — CBC
HCT: 39.3 % (ref 36.0–46.0)
Hemoglobin: 12.9 g/dL (ref 12.0–15.0)
MCH: 30.9 pg (ref 26.0–34.0)
MCHC: 32.8 g/dL (ref 30.0–36.0)
MCV: 94.2 fL (ref 78.0–100.0)
Platelets: 146 10*3/uL — ABNORMAL LOW (ref 150–400)
RBC: 4.17 MIL/uL (ref 3.87–5.11)
RDW: 14 % (ref 11.5–15.5)
WBC: 8 10*3/uL (ref 4.0–10.5)

## 2014-09-22 LAB — BASIC METABOLIC PANEL
ANION GAP: 3 — AB (ref 5–15)
BUN: 13 mg/dL (ref 6–23)
CHLORIDE: 103 mmol/L (ref 96–112)
CO2: 31 mmol/L (ref 19–32)
CREATININE: 0.82 mg/dL (ref 0.50–1.10)
Calcium: 7.9 mg/dL — ABNORMAL LOW (ref 8.4–10.5)
GFR calc Af Amer: 74 mL/min — ABNORMAL LOW (ref >90)
GFR calc non Af Amer: 63 mL/min — ABNORMAL LOW (ref >90)
Glucose, Bld: 107 mg/dL — ABNORMAL HIGH (ref 70–99)
Potassium: 3.7 mmol/L (ref 3.5–5.1)
Sodium: 137 mmol/L (ref 135–145)

## 2014-09-22 LAB — SURGICAL PCR SCREEN
MRSA, PCR: NEGATIVE
STAPHYLOCOCCUS AUREUS: NEGATIVE

## 2014-09-22 SURGERY — HEMIARTHROPLASTY, HIP, DIRECT ANTERIOR APPROACH, FOR FRACTURE
Anesthesia: Monitor Anesthesia Care | Laterality: Right

## 2014-09-22 MED ORDER — DOCUSATE SODIUM 100 MG PO CAPS
100.0000 mg | ORAL_CAPSULE | Freq: Two times a day (BID) | ORAL | Status: DC
  Administered 2014-09-22 – 2014-09-25 (×6): 100 mg via ORAL
  Filled 2014-09-22 (×8): qty 1

## 2014-09-22 MED ORDER — MENTHOL 3 MG MT LOZG: 1.0000 | LOZENGE | OROMUCOSAL | Status: DC | PRN

## 2014-09-22 MED ORDER — LACTATED RINGERS IV SOLN
INTRAVENOUS | Status: DC | PRN
  Administered 2014-09-22: 10:00:00 via INTRAVENOUS

## 2014-09-22 MED ORDER — ACETAMINOPHEN 325 MG PO TABS: 650.0000 mg | ORAL_TABLET | Freq: Four times a day (QID) | ORAL | Status: DC | PRN

## 2014-09-22 MED ORDER — PHENOL 1.4 % MT LIQD: 1.0000 | OROMUCOSAL | Status: DC | PRN

## 2014-09-22 MED ORDER — ACETAMINOPHEN 650 MG RE SUPP: 650.0000 mg | Freq: Four times a day (QID) | RECTAL | Status: DC | PRN

## 2014-09-22 MED ORDER — CEFAZOLIN SODIUM 1-5 GM-% IV SOLN
1.0000 g | Freq: Four times a day (QID) | INTRAVENOUS | Status: AC
  Administered 2014-09-22 (×2): 1 g via INTRAVENOUS
  Filled 2014-09-22 (×2): qty 50

## 2014-09-22 MED ORDER — ENOXAPARIN SODIUM 40 MG/0.4ML ~~LOC~~ SOLN
40.0000 mg | SUBCUTANEOUS | Status: DC
  Administered 2014-09-23 – 2014-09-25 (×3): 40 mg via SUBCUTANEOUS
  Filled 2014-09-22 (×4): qty 0.4

## 2014-09-22 MED ORDER — PROPOFOL 10 MG/ML IV BOLUS
INTRAVENOUS | Status: AC
  Filled 2014-09-22: qty 20

## 2014-09-22 MED ORDER — HYDROCODONE-ACETAMINOPHEN 5-325 MG PO TABS: 1.0000 | ORAL_TABLET | Freq: Four times a day (QID) | ORAL | Status: DC | PRN

## 2014-09-22 MED ORDER — OXYCODONE HCL 5 MG/5ML PO SOLN: 5.0000 mg | Freq: Once | ORAL | Status: DC | PRN

## 2014-09-22 MED ORDER — SODIUM CHLORIDE 0.9 % IR SOLN
Status: DC | PRN
  Administered 2014-09-22: 1000 mL

## 2014-09-22 MED ORDER — FENTANYL CITRATE 0.05 MG/ML IJ SOLN
INTRAMUSCULAR | Status: AC
  Filled 2014-09-22: qty 5

## 2014-09-22 MED ORDER — PHENYLEPHRINE HCL 10 MG/ML IJ SOLN
INTRAMUSCULAR | Status: DC | PRN
  Administered 2014-09-22 (×3): 40 ug via INTRAVENOUS

## 2014-09-22 MED ORDER — METHOCARBAMOL 500 MG PO TABS
500.0000 mg | ORAL_TABLET | Freq: Four times a day (QID) | ORAL | Status: DC | PRN
  Filled 2014-09-22: qty 1

## 2014-09-22 MED ORDER — ALUM & MAG HYDROXIDE-SIMETH 200-200-20 MG/5ML PO SUSP: 30.0000 mL | ORAL | Status: DC | PRN

## 2014-09-22 MED ORDER — POLYETHYLENE GLYCOL 3350 17 G PO PACK
17.0000 g | PACK | Freq: Every day | ORAL | Status: DC | PRN
  Administered 2014-09-22 – 2014-09-23 (×2): 17 g via ORAL
  Filled 2014-09-22 (×5): qty 1

## 2014-09-22 MED ORDER — SODIUM CHLORIDE 0.9 % IV SOLN
INTRAVENOUS | Status: DC
  Filled 2014-09-22 (×2): qty 1000

## 2014-09-22 MED ORDER — FENTANYL CITRATE 0.05 MG/ML IJ SOLN
INTRAMUSCULAR | Status: DC | PRN
  Administered 2014-09-22: 50 ug via INTRAVENOUS

## 2014-09-22 MED ORDER — LIDOCAINE HCL (CARDIAC) 20 MG/ML IV SOLN
INTRAVENOUS | Status: AC
  Filled 2014-09-22: qty 5

## 2014-09-22 MED ORDER — CEFAZOLIN SODIUM-DEXTROSE 2-3 GM-% IV SOLR
INTRAVENOUS | Status: DC | PRN
  Administered 2014-09-22: 2 g via INTRAVENOUS

## 2014-09-22 MED ORDER — SUCCINYLCHOLINE CHLORIDE 20 MG/ML IJ SOLN
INTRAMUSCULAR | Status: AC
  Filled 2014-09-22: qty 1

## 2014-09-22 MED ORDER — METOCLOPRAMIDE HCL 5 MG/ML IJ SOLN
5.0000 mg | Freq: Three times a day (TID) | INTRAMUSCULAR | Status: DC | PRN
  Filled 2014-09-22: qty 2

## 2014-09-22 MED ORDER — ONDANSETRON HCL 4 MG PO TABS: 4.0000 mg | ORAL_TABLET | Freq: Four times a day (QID) | ORAL | Status: DC | PRN

## 2014-09-22 MED ORDER — METOCLOPRAMIDE HCL 5 MG PO TABS
5.0000 mg | ORAL_TABLET | Freq: Three times a day (TID) | ORAL | Status: DC | PRN
  Filled 2014-09-22: qty 2

## 2014-09-22 MED ORDER — FENTANYL CITRATE 0.05 MG/ML IJ SOLN: 25.0000 ug | INTRAMUSCULAR | Status: DC | PRN

## 2014-09-22 MED ORDER — ASPIRIN EC 325 MG PO TBEC: 325.0000 mg | DELAYED_RELEASE_TABLET | Freq: Two times a day (BID) | ORAL | Status: DC

## 2014-09-22 MED ORDER — ONDANSETRON HCL 4 MG/2ML IJ SOLN: 4.0000 mg | Freq: Once | INTRAMUSCULAR | Status: DC | PRN

## 2014-09-22 MED ORDER — ROCURONIUM BROMIDE 50 MG/5ML IV SOLN
INTRAVENOUS | Status: AC
  Filled 2014-09-22: qty 1

## 2014-09-22 MED ORDER — OXYCODONE HCL 5 MG PO TABS
5.0000 mg | ORAL_TABLET | Freq: Once | ORAL | Status: DC
  Filled 2014-09-22: qty 1

## 2014-09-22 MED ORDER — HYDROMORPHONE HCL 1 MG/ML IJ SOLN: 0.5000 mg | INTRAMUSCULAR | Status: DC | PRN

## 2014-09-22 MED ORDER — PROPOFOL INFUSION 10 MG/ML OPTIME
INTRAVENOUS | Status: DC | PRN
  Administered 2014-09-22: 25 ug/kg/min via INTRAVENOUS

## 2014-09-22 MED ORDER — LACTATED RINGERS IV SOLN
INTRAVENOUS | Status: DC
  Administered 2014-09-22: 08:00:00 via INTRAVENOUS

## 2014-09-22 MED ORDER — METHOCARBAMOL 1000 MG/10ML IJ SOLN
500.0000 mg | Freq: Four times a day (QID) | INTRAVENOUS | Status: DC | PRN
  Filled 2014-09-22: qty 5

## 2014-09-22 MED ORDER — ALBUMIN HUMAN 5 % IV SOLN
INTRAVENOUS | Status: DC | PRN
  Administered 2014-09-22: 10:00:00 via INTRAVENOUS

## 2014-09-22 MED ORDER — ONDANSETRON HCL 4 MG/2ML IJ SOLN
4.0000 mg | Freq: Four times a day (QID) | INTRAMUSCULAR | Status: DC | PRN
  Administered 2014-09-23: 4 mg via INTRAVENOUS
  Filled 2014-09-22: qty 2

## 2014-09-22 MED ORDER — OXYCODONE HCL 5 MG PO TABS: 5.0000 mg | ORAL_TABLET | Freq: Once | ORAL | Status: DC | PRN

## 2014-09-22 MED ORDER — PHENYLEPHRINE 40 MCG/ML (10ML) SYRINGE FOR IV PUSH (FOR BLOOD PRESSURE SUPPORT)
PREFILLED_SYRINGE | INTRAVENOUS | Status: AC
  Filled 2014-09-22: qty 10

## 2014-09-22 MED ORDER — HYDROCODONE-ACETAMINOPHEN 5-325 MG PO TABS
1.0000 | ORAL_TABLET | Freq: Four times a day (QID) | ORAL | Status: DC | PRN
  Administered 2014-09-22: 1 via ORAL
  Administered 2014-09-25: 2 via ORAL
  Filled 2014-09-22: qty 1
  Filled 2014-09-22: qty 2

## 2014-09-22 SURGICAL SUPPLY — 52 items
ADH SKN CLS APL DERMABOND .7 (GAUZE/BANDAGES/DRESSINGS) ×1
BLADE SAW SGTL 18X1.27X75 (BLADE) ×2 IMPLANT
BLADE SAW SGTL 18X1.27X75MM (BLADE) ×1
CAPT HIP HEMI 1 ×2 IMPLANT
COVER SURGICAL LIGHT HANDLE (MISCELLANEOUS) ×3 IMPLANT
DERMABOND ADVANCED (GAUZE/BANDAGES/DRESSINGS) ×2
DERMABOND ADVANCED .7 DNX12 (GAUZE/BANDAGES/DRESSINGS) ×1 IMPLANT
DRAPE IMP U-DRAPE 54X76 (DRAPES) ×3 IMPLANT
DRAPE INCISE IOBAN 85X60 (DRAPES) ×5 IMPLANT
DRAPE ORTHO SPLIT 77X108 STRL (DRAPES) ×6
DRAPE SURG ORHT 6 SPLT 77X108 (DRAPES) ×2 IMPLANT
DRAPE U-SHAPE 47X51 STRL (DRAPES) ×3 IMPLANT
DRSG AQUACEL AG ADV 3.5X10 (GAUZE/BANDAGES/DRESSINGS) ×3 IMPLANT
DURAPREP 26ML APPLICATOR (WOUND CARE) ×3 IMPLANT
ELECT BLADE 4.0 EZ CLEAN MEGAD (MISCELLANEOUS) ×3
ELECT REM PT RETURN 9FT ADLT (ELECTROSURGICAL) ×3
ELECTRODE BLDE 4.0 EZ CLN MEGD (MISCELLANEOUS) ×1 IMPLANT
ELECTRODE REM PT RTRN 9FT ADLT (ELECTROSURGICAL) ×1 IMPLANT
EVACUATOR 1/8 PVC DRAIN (DRAIN) IMPLANT
FACESHIELD WRAPAROUND (MASK) ×6 IMPLANT
FACESHIELD WRAPAROUND OR TEAM (MASK) ×2 IMPLANT
GLOVE BIOGEL PI IND STRL 7.5 (GLOVE) ×1 IMPLANT
GLOVE BIOGEL PI IND STRL 8 (GLOVE) ×2 IMPLANT
GLOVE BIOGEL PI INDICATOR 7.5 (GLOVE) ×2
GLOVE BIOGEL PI INDICATOR 8 (GLOVE) ×4
GLOVE ECLIPSE 7.5 STRL STRAW (GLOVE) ×6 IMPLANT
GLOVE ECLIPSE 8.0 STRL XLNG CF (GLOVE) ×3 IMPLANT
GLOVE ORTHO TXT STRL SZ7.5 (GLOVE) ×3 IMPLANT
GLOVE SURG ORTHO 8.0 STRL STRW (GLOVE) ×3 IMPLANT
GOWN STRL REIN 3XL XLG LVL4 (GOWN DISPOSABLE) ×3 IMPLANT
GOWN STRL REUS W/ TWL LRG LVL3 (GOWN DISPOSABLE) ×3 IMPLANT
GOWN STRL REUS W/TWL LRG LVL3 (GOWN DISPOSABLE) ×9
HANDPIECE INTERPULSE COAX TIP (DISPOSABLE)
IMMOBILIZER KNEE 22 UNIV (SOFTGOODS) ×3 IMPLANT
KIT BASIN OR (CUSTOM PROCEDURE TRAY) ×3 IMPLANT
KIT ROOM TURNOVER OR (KITS) ×3 IMPLANT
MANIFOLD NEPTUNE II (INSTRUMENTS) ×3 IMPLANT
NS IRRIG 1000ML POUR BTL (IV SOLUTION) ×3 IMPLANT
PACK TOTAL JOINT (CUSTOM PROCEDURE TRAY) ×3 IMPLANT
PACK UNIVERSAL I (CUSTOM PROCEDURE TRAY) ×3 IMPLANT
PAD ARMBOARD 7.5X6 YLW CONV (MISCELLANEOUS) ×6 IMPLANT
SET HNDPC FAN SPRY TIP SCT (DISPOSABLE) IMPLANT
SPONGE LAP 4X18 X RAY DECT (DISPOSABLE) ×6 IMPLANT
SUT MNCRL AB 4-0 PS2 18 (SUTURE) IMPLANT
SUT VIC AB 1 CT1 27 (SUTURE) ×6
SUT VIC AB 1 CT1 27XBRD ANBCTR (SUTURE) ×2 IMPLANT
SUT VIC AB 2-0 CT1 27 (SUTURE)
SUT VIC AB 2-0 CT1 TAPERPNT 27 (SUTURE) IMPLANT
TOWEL OR 17X24 6PK STRL BLUE (TOWEL DISPOSABLE) ×3 IMPLANT
TOWEL OR 17X26 10 PK STRL BLUE (TOWEL DISPOSABLE) ×3 IMPLANT
TRAY FOLEY CATH 14FR (SET/KITS/TRAYS/PACK) IMPLANT
WATER STERILE IRR 1000ML POUR (IV SOLUTION) ×12 IMPLANT

## 2014-09-22 NOTE — Anesthesia Procedure Notes (Signed)
Spinal Patient location during procedure: OR Start time: 09/22/2014 10:06 AM End time: 09/22/2014 10:10 AM Staffing Performed by: anesthesiologist  Preanesthetic Checklist Completed: patient identified, site marked, surgical consent, pre-op evaluation, timeout performed, IV checked, risks and benefits discussed and monitors and equipment checked Spinal Block Patient position: right lateral decubitus Prep: ChloraPrep Patient monitoring: heart rate, cardiac monitor, continuous pulse ox and blood pressure Approach: right paramedian Location: L3-4 Injection technique: single-shot Needle Needle gauge: 22 G Needle length: 9 cm Assessment Sensory level: T8 Additional Notes Two injections made, 10 mg 0.75% Bupivacaine injected no block noted after 3-4 min. Decision made to repeat injection through new another injection. !0 mg 0.75% bupivacaine injected easily. Good SAB noted, surgery proceeded uneventfully.  Roberts Gaudy

## 2014-09-22 NOTE — Transfer of Care (Signed)
Immediate Anesthesia Transfer of Care Note  Patient: [name]  Procedure(s) Performed: Procedure(s): ARTHROPLASTY BIPOLAR HIP (Right)  Patient Location: PACU  Anesthesia Type:General  Level of Consciousness: awake, alert  and oriented  Airway & Oxygen Therapy: Patient connected to face mask oxygen  Post-op Assessment: Report given to RN  Post vital signs: stable  Last Vitals:  Filed Vitals:   09/22/14 0504  BP: 124/62  Pulse: 82  Temp: 36.7 C  Resp: 18    Complications: No apparent anesthesia complications

## 2014-09-22 NOTE — Progress Notes (Signed)
Orthopedic Tech Progress Note Patient Details:  [name] 08/26/1926 [id number]  Patient ID: Conley Simmonds, female   DOB: 1927-01-14, 81 y.o.   MRN: [id number] Pt unable to use trapeze bar patient helper  Hildred Priest 09/22/2014, 1:51 PM

## 2014-09-22 NOTE — Progress Notes (Signed)
Pt unable to sign consent tonight as she states she cannot have general anesthesia and wants to speak with a doctor prior to the surgery. Blood consent obtained from patient. Pt kept NPO after midnight.

## 2014-09-22 NOTE — H&P (View-Only) (Signed)
Melinda Stack, MD Chief Complaint: Right hip fracture History: Elderly woman who fell yesterday.  Today she noted acute pain and inability to weight bear.  Brought to ER for further evaluation.   Past Medical History  Diagnosis Date  . Hyperlipidemia   . Irregular heartbeat   . Arthritis   . Fibromyalgia   . CHF (congestive heart failure)   . Coronary artery disease   . Hiatal hernia   . COPD (chronic obstructive pulmonary disease)   . Osteoporosis   . Anxiety   . Depression   . Rotator cuff tear     RIGHT    No Known Allergies  No current facility-administered medications on file prior to encounter.   Current Outpatient Prescriptions on File Prior to Encounter  Medication Sig Dispense Refill  . albuterol (PROVENTIL HFA;VENTOLIN HFA) 108 (90 BASE) MCG/ACT inhaler Inhale 2 puffs into the lungs every 6 (six) hours as needed. 1 Inhaler 5  . Carica Papaya (PAPAYA PO) Take 2-3 tablets by mouth daily as needed (digestion).     . Cholecalciferol (VITAMIN D3) 1000 UNITS CAPS Take 1 capsule by mouth daily.      . clorazepate (TRANXENE) 7.5 MG tablet Take 3.75 mg by mouth 2 (two) times daily.     . cyanocobalamin (,VITAMIN B-12,) 1000 MCG/ML injection Inject 1,000 mcg into the muscle every 30 (thirty) days.      . fesoterodine (TOVIAZ) 4 MG TB24 tablet Take 4 mg by mouth at bedtime.    . hydrocortisone (ANUSOL-HC) 25 MG suppository Place 25 mg rectally as needed.      . hydroxypropyl methylcellulose (ISOPTO TEARS) 2.5 % ophthalmic solution Place 1 drop into both eyes as needed for dry eyes.     Marland Kitchen ibuprofen (ADVIL,MOTRIN) 200 MG tablet Take 200 mg by mouth every 6 (six) hours as needed for moderate pain.     Marland Kitchen lansoprazole (PREVACID) 15 MG capsule Take 15 mg by mouth daily as needed.     Marland Kitchen levothyroxine (SYNTHROID, LEVOTHROID) 75 MCG tablet Take 75 mcg by mouth daily.      . Multiple Vitamin (MULTIVITAMIN) capsule Take 1 capsule by mouth daily.      . Multiple Vitamins-Minerals  (ICAPS) CAPS Take 1 capsule by mouth daily.    . nitroGLYCERIN (NITROSTAT) 0.4 MG SL tablet Place 0.4 mg under the tongue every 5 (five) minutes as needed.      Vladimir Faster Glycol-Propyl Glycol (SYSTANE) 0.4-0.3 % SOLN Apply to eye as directed.      . polyethylene glycol (MIRALAX / GLYCOLAX) packet Take 17 g by mouth daily as needed for mild constipation.       Physical Exam: Filed Vitals:   09/21/14 1400  BP: 170/67  Pulse: 95  Temp:   Resp: 24  A+O x3 NVI  Compartments soft/nt No sob/cp abd soft/nt Right hip pain no laceration    Image: Dg Chest 2 View  09/21/2014   CLINICAL DATA:  Right hip pain  EXAM: CHEST  2 VIEW  COMPARISON:  07/03/2014  FINDINGS: Normal heart size. There is no pleural effusion identified. No airspace consolidation. The lungs are hyperinflated and there are coarsened interstitial markings noted bilaterally. Bronchiectasis and scarring involving the right middle lobe and lingular portion the left lung are noted. The bones appear osteopenic and there is a scoliosis deformity involving the thoracic spine.  IMPRESSION: 1. No acute cardiopulmonary abnormalities. 2. Chronic interstitial changes.   Electronically Signed   By: Kerby Moors M.D.   On:  09/21/2014 12:37   Dg Hip Unilat With Pelvis 2-3 Views Right  09/21/2014   CLINICAL DATA:  Hip pain  EXAM: RIGHT HIP (WITH PELVIS) 2-3 VIEWS  COMPARISON:  None  FINDINGS: There is a right subcapital femoral neck fracture. There is displacement of the distal fracture fragments proximally. Left hip appears located and intact.  IMPRESSION: Acute right subcapital femoral neck fracture.   Electronically Signed   By: Kerby Moors M.D.   On: 09/21/2014 12:38    A/P:  Elderly woman s/p fall yesterday with acute worsening pain and inability to ambulate xrays demonstrate displaced femoral neck fracture right side No focal neuro deficits Plan on hemiarthroplasty Will discuss with my partner for plan on definitive fracture  management Await medical eval and clearance If patient cleared will plan on surgery Saturday.

## 2014-09-22 NOTE — Op Note (Signed)
NAME:  [name]                ACCOUNT NO.:  [id number]   MEDICAL RECORD NO.: [id number]   LOCATION:  4403                         FACILITY:  [facility]   DATE OF BIRTH:  2027-01-14  PHYSICIAN:  Pietro Cassis. Alvan Dame, M.D.     DATE OF PROCEDURE:  09/22/14                               OPERATIVE REPORT     PREOPERATIVE DIAGNOSIS:  Right displaced femoral neck fracture.   POSTOPERATIVE DIAGNOSIS:  Right displaced femoral neck fracture.   PROCEDURE:  Right hip hemiarthroplasty utilizing DePuy component, size 5 standard [employer] stem with a 48mm unipolar ball with a +5 adapter.   SURGEON:  Pietro Cassis. Alvan Dame, MD   ASSISTANT:  None.   ANESTHESIA:  Spinal.   SPECIMENS:  None.   DRAINS:  None.   BLOOD LOSS:  About 200 cc.   COMPLICATIONS:  None.   INDICATION OF PROCEDURE:  Ms [name] is a 81 year old female who lives independently.  She unfortunately had a fall at her house.  She was admitted to the hospital after radiographs revealed a femoral neck fracture.  She was seen and evaluated and was scheduled for surgery for fixation.  The necessity of surgical repair was discussed with she and her family.  Consent was obtained after reviewing risks of infection, DVT, component failure, and need for revision surgery.   PROCEDURE IN DETAIL:  The patient was brought to the operative theater. Once adequate anesthesia, preoperative antibiotics, 2 g of Ancef administered, the patient was positioned into the left lateral decubitus position with the left side up.  The left lower extremity was then prepped and draped in sterile fashion.  A time-out was performed identifying the patient, planned procedure, and extremity.   A lateral incision was made off the proximal trochanter. Sharp dissection was carried down to the iliotibial band and gluteal fascia. The gluteal fascia was then incised for posterior approach.  The short external rotators were taken down separate from the posterior  capsule. An L capsulotomy was made preserving the posterior leaflet for later anatomic repair. Fracture site was identified and after removing comminuted segments of the posterior femoral neck, the femoral head was removed without difficulty and measured on the back table  using the sizing rings and determined to be 48 mm in diameter.   The proximal femur was then exposed.  Retractors placed.  I then drilled, opened the proximal femur.  Then I hand reamed once and  Irrigated the canal to try to prevent fat emboli.  I began broaching the femur with a starting size 1 broach up to a size 5 broach with good medial and lateral metaphyseal fit without evidence of any torsion or movement.  A trial reduction was carried out with a standard neck and a 0 the +5 adapter with a 39mm ball.  The hip reduced nicely.  The leg lengths appeared to be equal compared to the down leg.   The hip went through a range of motion without evidence of any subluxation or impingement.   Given these findings, the trial components removed.  The final size 5  [employer] stem was opened.  After irrigating the  canal, the final stem was impacted and sat at the level where the broach was. Based on this and the trial reduction, a +5 adapter was opened and impacted in the 52mm unipolar ball onto a clean and dry trunnion.  The hip had been irrigated throughout the case and again at this point.  I re- Approximated the posterior capsule to the superior leaflet using a  #1 Vicryl.  The remainder of the wound was closed with #1 Vicryl in the iliotibial band and gluteal fascia, a  2-0 Vicryl in the sub-Q tissue and a running 3-0 Monocryl in the skin.  The hip was cleaned, dried, and dressed sterilely using Dermabond and Aquacel dressing.  She was then brought to recovery room, extubated in stable condition, tolerating the procedure well.        Pietro Cassis Alvan Dame, M.D.

## 2014-09-22 NOTE — Anesthesia Postprocedure Evaluation (Signed)
  Anesthesia Post-op Note  Patient: [name]  Procedure(s) Performed: Procedure(s): ARTHROPLASTY BIPOLAR HIP (Right)  Patient Location: PACU  Anesthesia Type:Spinal  Level of Consciousness: awake, alert  and oriented  Airway and Oxygen Therapy: Patient Spontanous Breathing and Patient connected to nasal cannula oxygen  Post-op Pain: none  Post-op Assessment: Post-op Vital signs reviewed, Patient's Cardiovascular Status Stable, Respiratory Function Stable, Patent Airway, No signs of Nausea or vomiting and Pain level controlled  Post-op Vital Signs: stable  Last Vitals:  Filed Vitals:   09/22/14 1404  BP: 133/67  Pulse: 79  Temp: 36.6 C  Resp: 18    Complications: No apparent anesthesia complications

## 2014-09-22 NOTE — Anesthesia Preprocedure Evaluation (Addendum)
Anesthesia Evaluation  Patient identified by MRN, date of birth, ID band Patient awake    Reviewed: Allergy & Precautions, NPO status , Patient's Chart, lab work & pertinent test results  Airway Mallampati: II  TM Distance: >3 FB Neck ROM: Full    Dental  (+) Teeth Intact, Dental Advisory Given   Pulmonary COPD COPD inhaler,  breath sounds clear to auscultation        Cardiovascular + CAD and +CHF + Valvular Problems/Murmurs Rhythm:Regular Rate:Normal     Neuro/Psych Anxiety Depression  Neuromuscular disease    GI/Hepatic hiatal hernia,   Endo/Other    Renal/GU      Musculoskeletal  (+) Arthritis -, Fibromyalgia -, narcotic dependent  Abdominal   Peds  Hematology   Anesthesia Other Findings EF 65-70%.    Reproductive/Obstetrics                          Anesthesia Physical Anesthesia Plan  ASA: III  Anesthesia Plan: MAC and Spinal   Post-op Pain Management:    Induction: Intravenous  Airway Management Planned: Natural Airway and Simple Face Mask  Additional Equipment:   Intra-op Plan:   Post-operative Plan:   Informed Consent: I have reviewed the patients History and Physical, chart, labs and discussed the procedure including the risks, benefits and alternatives for the proposed anesthesia with the patient or authorized representative who has indicated his/her understanding and acceptance.   Dental advisory given  Plan Discussed with: CRNA and Anesthesiologist  Anesthesia Plan Comments: (Plan SAB  Roberts Gaudy)        Anesthesia Quick Evaluation

## 2014-09-22 NOTE — Progress Notes (Signed)
[facility] PROGRESS NOTE  [name] NTZ:[id number] DOB: 08/14/1926 DOA: 09/21/2014 PCP: [name], MD  Assessment/Plan: 1. Right displaced femoral neck fracture.  -Patient having mechanical fall at home, denied loss of consciousness or symptoms prior to event. -Imaging studies revealed the presence of a right displaced femoral neck fracture -She was taken to the operating room on 09/22/2014 undergoing right hip hemiarthroplasty. -Will need physical therapy/occupational therapy along with social work consult for skilled nursing facility placement -Repeat CBC and BMP in a.m.  2.  Hypothyroidism. -TSH within normal limits at 1.54. -Continue Synthroid at 75 g by mouth every daily  3.  Chronic obstructive pulmonary disease -Patient not having any active issues  4.  Pain management -Continue oxycodone 5 mg by mouth every 4 hours when necessary and morphine 1 mg IV every 3 hours as needed for severe breakthrough pain  5.  Bowel regimen -Continue Mira-lax and Colace by mouth twice a day  Code Status: DO NOT RESUSCITATE Family Communication: Spoke with patient's fianc Disposition Plan: Patient will require skilled nursing facility placement for acute rehabilitation   Consultants:  Orthopedic surgery  Procedures:  Right hip hemiarthroplasty performed on 09/22/2014   HPI/Subjective: Patient is a pleasant 81 year old female with a past medical history of chronic obstructive pulmonary disease, chronic diastolic congestive heart failure, admitted to the medicine service on 09/21/2014 presented with complaints of right hip pain sustaining a fall 2 days prior to hospitalization. Imaging studies revealed the presence of a displaced femoral neck fracture to right side. She was evaluated by orthopedic surgery, taken to the operating room on 09/22/2014 undergoing right hip hemiarthroplasty.  Objective: Filed Vitals:   09/22/14 1215  BP: 172/70  Pulse: 70  Temp: 98 F  (36.7 C)  Resp:     Intake/Output Summary (Last 24 hours) at 09/22/14 1312 Last data filed at 09/22/14 1200  Gross per 24 hour  Intake 2099.17 ml  Output   1525 ml  Net 574.17 ml   Filed Weights   09/21/14 1126 09/21/14 1505 09/22/14 0504  Weight: 58.06 kg (128 lb) 60.2 kg (132 lb 11.5 oz) 61.85 kg (136 lb 5.7 oz)    Exam:   General:  Patient reporting pain to her right hip, states having a rough night, unable to sleep.  Cardiovascular: Regular rate and rhythm normal S1-S2  Respiratory: Clear to auscultation bilaterally no wheezing rhonchi rales  Abdomen: Soft nontender nondistended positive bowel sounds  Musculoskeletal: Patient having pain to right hip with passive and active movement  Data Reviewed: Basic Metabolic Panel:  Recent Labs Lab 09/21/14 1253 09/22/14 0555  NA 136 137  K 3.5 3.7  CL 101 103  CO2 28 31  GLUCOSE 108* 107*  BUN 16 13  CREATININE 0.90 0.82  CALCIUM 8.5 7.9*   Liver Function Tests: No results for input(s): AST, ALT, ALKPHOS, BILITOT, PROT, ALBUMIN in the last 168 hours. No results for input(s): LIPASE, AMYLASE in the last 168 hours. No results for input(s): AMMONIA in the last 168 hours. CBC:  Recent Labs Lab 09/21/14 1253 09/22/14 0555  WBC 12.1* 8.0  NEUTROABS 9.9*  --   HGB 13.3 12.9  HCT 40.1 39.3  MCV 93.0 94.2  PLT 164 146*   Cardiac Enzymes: No results for input(s): CKTOTAL, CKMB, CKMBINDEX, TROPONINI in the last 168 hours. BNP (last 3 results) No results for input(s): BNP in the last 8760 hours.  ProBNP (last 3 results) No results for input(s): PROBNP in the last 8760 hours.  CBG: No results for input(s): GLUCAP in the last 168 hours.  Recent Results (from the past 240 hour(s))  Surgical pcr screen     Status: None   Collection Time: 09/21/14 10:57 PM  Result Value Ref Range Status   MRSA, PCR NEGATIVE NEGATIVE Final   Staphylococcus aureus NEGATIVE NEGATIVE Final    Comment:        The Xpert SA Assay  (FDA approved for NASAL specimens in patients over 82 years of age), is one component of a comprehensive surveillance program.  Test performance has been validated by [facility] for patients greater than or equal to 67 year old. It is not intended to diagnose infection nor to guide or monitor treatment.      Studies: Dg Chest 2 View  09/21/2014   CLINICAL DATA:  Right hip pain  EXAM: CHEST  2 VIEW  COMPARISON:  07/03/2014  FINDINGS: Normal heart size. There is no pleural effusion identified. No airspace consolidation. The lungs are hyperinflated and there are coarsened interstitial markings noted bilaterally. Bronchiectasis and scarring involving the right middle lobe and lingular portion the left lung are noted. The bones appear osteopenic and there is a scoliosis deformity involving the thoracic spine.  IMPRESSION: 1. No acute cardiopulmonary abnormalities. 2. Chronic interstitial changes.   Electronically Signed   By: Kerby Moors M.D.   On: 09/21/2014 12:37   Pelvis Portable  09/22/2014   CLINICAL DATA:  81 year old female with a history of right hip hemi arthroplasty.  EXAM: PORTABLE PELVIS 1-2 VIEWS  COMPARISON:  09/21/2014  FINDINGS: Interval surgical fixation of previously identified displaced right femoral neck fracture, with changes of right hip hemi arthroplasty.  Expected soft tissue swelling and gas at the surgical site.  No complicating features.  Remainder of the bony structures unremarkable.  IMPRESSION: Interval surgical changes of right hip hemi arthroplasty for fixation of previous identified displaced femoral neck fracture. No complicating features.  Signed,  Dulcy Fanny. Earleen Newport, DO  [facility]  [facility]   Electronically Signed   By: Corrie Mckusick D.O.   On: 09/22/2014 11:26   Dg Hip Unilat With Pelvis 2-3 Views Right  09/21/2014   CLINICAL DATA:  Hip pain  EXAM: RIGHT HIP (WITH PELVIS) 2-3 VIEWS  COMPARISON:  None  FINDINGS:  There is a right subcapital femoral neck fracture. There is displacement of the distal fracture fragments proximally. Left hip appears located and intact.  IMPRESSION: Acute right subcapital femoral neck fracture.   Electronically Signed   By: Kerby Moors M.D.   On: 09/21/2014 12:38    Scheduled Meds: .  ceFAZolin (ANCEF) IV  1 g Intravenous Q6H  . docusate sodium  100 mg Oral BID  . [START ON 09/23/2014] enoxaparin (LOVENOX) injection  40 mg Subcutaneous Q24H  . levothyroxine  75 mcg Oral QAC breakfast  . oxyCODONE  5 mg Oral Once   Continuous Infusions: . sodium chloride 50 mL/hr at 09/21/14 1727  . sodium chloride 0.9 % 1,000 mL with potassium chloride 10 mEq infusion      Principal Problem:   Hip fracture Active Problems:   Bronchiectasis without acute exacerbation   Fall at home    Time spent: 30 minutes    Kelvin Cellar  [facility] Pager [phone]. If 7PM-7AM, please contact night-coverage at [email or web address], password [facility] 09/22/2014, 1:12 PM  LOS: 1 day

## 2014-09-22 NOTE — Progress Notes (Signed)
Patient ID: [name], female   DOB: 1927-04-04, 81 y.o.   MRN: [id number]  Have been asked to take over definitive care of [name]' right hip fracture  Met her in OR holding area this am Reviewed indications Post-op expectations  To OR today for right hip hemiarthroplasty NPO

## 2014-09-22 NOTE — Interval H&P Note (Signed)
History and Physical Interval Note:  09/22/2014 8:54 AM  [name]  has presented today for surgery, with the diagnosis of Right hip fracture  The various methods of treatment have been discussed with the patient and family. After consideration of risks, benefits and other options for treatment, the patient has consented to  Procedure(s): ARTHROPLASTY BIPOLAR HIP (Right) as a surgical intervention .  The patient's history has been reviewed, patient examined, no change in status, stable for surgery.  I have reviewed the patient's chart and labs.  Questions were answered to the patient's satisfaction.     [name]

## 2014-09-23 LAB — CBC
HEMATOCRIT: 37.7 % (ref 36.0–46.0)
Hemoglobin: 12.1 g/dL (ref 12.0–15.0)
MCH: 30.7 pg (ref 26.0–34.0)
MCHC: 32.1 g/dL (ref 30.0–36.0)
MCV: 95.7 fL (ref 78.0–100.0)
PLATELETS: 171 10*3/uL (ref 150–400)
RBC: 3.94 MIL/uL (ref 3.87–5.11)
RDW: 13.9 % (ref 11.5–15.5)
WBC: 12.2 10*3/uL — AB (ref 4.0–10.5)

## 2014-09-23 LAB — BASIC METABOLIC PANEL
ANION GAP: 5 (ref 5–15)
BUN: 11 mg/dL (ref 6–23)
CALCIUM: 8.2 mg/dL — AB (ref 8.4–10.5)
CO2: 31 mmol/L (ref 19–32)
CREATININE: 0.92 mg/dL (ref 0.50–1.10)
Chloride: 100 mmol/L (ref 96–112)
GFR calc Af Amer: 64 mL/min — ABNORMAL LOW (ref >90)
GFR, EST NON AFRICAN AMERICAN: 55 mL/min — AB (ref >90)
GLUCOSE: 133 mg/dL — AB (ref 70–99)
Potassium: 4.1 mmol/L (ref 3.5–5.1)
SODIUM: 136 mmol/L (ref 135–145)

## 2014-09-23 MED ORDER — METOPROLOL TARTRATE 12.5 MG HALF TABLET
12.5000 mg | ORAL_TABLET | Freq: Two times a day (BID) | ORAL | Status: DC
  Administered 2014-09-23: 12.5 mg via ORAL
  Filled 2014-09-23 (×3): qty 1

## 2014-09-23 MED ORDER — SODIUM CHLORIDE 0.9 % IV BOLUS (SEPSIS): 250.0000 mL | Freq: Once | INTRAVENOUS | Status: DC

## 2014-09-23 MED ORDER — SODIUM CHLORIDE 0.9 % IV BOLUS (SEPSIS)
500.0000 mL | Freq: Once | INTRAVENOUS | Status: AC
  Administered 2014-09-23: 500 mL via INTRAVENOUS

## 2014-09-23 MED ORDER — BISACODYL 10 MG RE SUPP
10.0000 mg | Freq: Once | RECTAL | Status: AC
  Administered 2014-09-23: 10 mg via RECTAL
  Filled 2014-09-23: qty 1

## 2014-09-23 MED ORDER — LACTULOSE 10 GM/15ML PO SOLN
20.0000 g | Freq: Once | ORAL | Status: AC
Start: 2014-09-23 — End: 2014-09-23
  Administered 2014-09-23: 20 g via ORAL
  Filled 2014-09-23: qty 30

## 2014-09-23 MED ORDER — MILK AND MOLASSES ENEMA
1.0000 | Freq: Once | RECTAL | Status: AC
  Administered 2014-09-23: 250 mL via RECTAL
  Filled 2014-09-23: qty 250

## 2014-09-23 NOTE — Progress Notes (Signed)
[facility] PROGRESS NOTE  [name] KGU:[id number] DOB: [birth year] DOA: 09/21/2014 PCP: [name], MD  Assessment/Plan: 1. Right displaced femoral neck fracture.  -Patient having mechanical fall at home, denied loss of consciousness or symptoms prior to event. -Imaging studies revealed the presence of a right displaced femoral neck fracture -She was taken to the operating room on 09/22/2014 undergoing right hip hemiarthroplasty. -Will need physical therapy/occupational therapy along with social work consult for skilled nursing facility placement -SW consult  2.  Constipation -Patient reporting not having a bowel movement in 2-3 days -Will continue Colace and Miralax, will give a dulcolax suppository and lactulose today.  -Immobility and meds likely contributing to her constipation   3.  Chronic obstructive pulmonary disease -Patient not having any active issues  4.  Pain management -Continue oxycodone 5 mg by mouth every 4 hours when necessary and morphine 1 mg IV every 3 hours as needed for severe breakthrough pain  5.  Frequent PVC's -Telemetry showing frequent PVC's, could be related to her history of lung disease, she denies CP or SOB -Gave 12.5 mg of Metoprolol however she developed hypotension for which this was stopped.  6. Hypotension -Patient having blood pressures in the low 100's which I think is related to combination of dehydration and beta blocker -Stopped beta blocker, will give a 551mL bolus of NS    Code Status: DO NOT RESUSCITATE Family Communication: Spoke with patient's fianc Disposition Plan: Patient will require skilled nursing facility placement for acute rehabilitation   Consultants:  Orthopedic surgery  Procedures:  Right hip hemiarthroplasty performed on 09/22/2014   HPI/Subjective: Patient is a pleasant 81 year old female with a past medical history of chronic obstructive pulmonary disease, chronic diastolic congestive  heart failure, admitted to the medicine service on 09/21/2014 presented with complaints of right hip pain sustaining a fall 2 days prior to hospitalization. Imaging studies revealed the presence of a displaced femoral neck fracture to right side. She was evaluated by orthopedic surgery, taken to the operating room on 09/22/2014 undergoing right hip hemiarthroplasty.  Objective: Filed Vitals:   09/23/14 1114  BP: 108/46  Pulse: 88  Temp:   Resp:     Intake/Output Summary (Last 24 hours) at 09/23/14 1316 Last data filed at 09/23/14 1054  Gross per 24 hour  Intake    600 ml  Output   1000 ml  Net   -400 ml   Filed Weights   09/21/14 1505 09/22/14 0504 09/23/14 0515  Weight: 60.2 kg (132 lb 11.5 oz) 61.85 kg (136 lb 5.7 oz) 62.4 kg (137 lb 9.1 oz)    Exam:   General:  Patient reporting ongoing pain to her right hip  Cardiovascular: Regular rate and rhythm normal S1-S2  Respiratory: Clear to auscultation bilaterally no wheezing rhonchi rales  Abdomen: Soft nontender nondistended positive bowel sounds  Musculoskeletal: Patient having pain to right hip with passive and active movement  Data Reviewed: Basic Metabolic Panel:  Recent Labs Lab 09/21/14 1253 09/22/14 0555 09/23/14 0400  NA 136 137 136  K 3.5 3.7 4.1  CL 101 103 100  CO2 28 31 31   GLUCOSE 108* 107* 133*  BUN 16 13 11   CREATININE 0.90 0.82 0.92  CALCIUM 8.5 7.9* 8.2*   Liver Function Tests: No results for input(s): AST, ALT, ALKPHOS, BILITOT, PROT, ALBUMIN in the last 168 hours. No results for input(s): LIPASE, AMYLASE in the last 168 hours. No results for input(s): AMMONIA in the last 168 hours. CBC:  Recent  Labs Lab 09/21/14 1253 09/22/14 0555 09/23/14 0400  WBC 12.1* 8.0 12.2*  NEUTROABS 9.9*  --   --   HGB 13.3 12.9 12.1  HCT 40.1 39.3 37.7  MCV 93.0 94.2 95.7  PLT 164 146* 171   Cardiac Enzymes: No results for input(s): CKTOTAL, CKMB, CKMBINDEX, TROPONINI in the last 168 hours. BNP  (last 3 results) No results for input(s): BNP in the last 8760 hours.  ProBNP (last 3 results) No results for input(s): PROBNP in the last 8760 hours.  CBG: No results for input(s): GLUCAP in the last 168 hours.  Recent Results (from the past 240 hour(s))  Surgical pcr screen     Status: None   Collection Time: 09/21/14 10:57 PM  Result Value Ref Range Status   MRSA, PCR NEGATIVE NEGATIVE Final   Staphylococcus aureus NEGATIVE NEGATIVE Final    Comment:        The Xpert SA Assay (FDA approved for NASAL specimens in patients over 59 years of age), is one component of a comprehensive surveillance program.  Test performance has been validated by [facility] for patients greater than or equal to 63 year old. It is not intended to diagnose infection nor to guide or monitor treatment.      Studies: Pelvis Portable  09/22/2014   CLINICAL DATA:  81 year old female with a history of right hip hemi arthroplasty.  EXAM: PORTABLE PELVIS 1-2 VIEWS  COMPARISON:  09/21/2014  FINDINGS: Interval surgical fixation of previously identified displaced right femoral neck fracture, with changes of right hip hemi arthroplasty.  Expected soft tissue swelling and gas at the surgical site.  No complicating features.  Remainder of the bony structures unremarkable.  IMPRESSION: Interval surgical changes of right hip hemi arthroplasty for fixation of previous identified displaced femoral neck fracture. No complicating features.  Signed,  Dulcy Fanny. Earleen Newport, DO  [facility]  [facility]   Electronically Signed   By: Corrie Mckusick D.O.   On: 09/22/2014 11:26    Scheduled Meds: . bisacodyl  10 mg Rectal Once  . docusate sodium  100 mg Oral BID  . enoxaparin (LOVENOX) injection  40 mg Subcutaneous Q24H  . levothyroxine  75 mcg Oral QAC breakfast  . metoprolol tartrate  12.5 mg Oral BID WC  . oxyCODONE  5 mg Oral Once  . sodium chloride  250 mL Intravenous Once    Continuous Infusions: . sodium chloride 50 mL/hr at 09/21/14 1727  . sodium chloride 0.9 % 1,000 mL with potassium chloride 10 mEq infusion      Principal Problem:   Hip fracture Active Problems:   Bronchiectasis without acute exacerbation   Fall at home    Time spent: 35 minutes    Kelvin Cellar  [facility] Pager [phone]. If 7PM-7AM, please contact night-coverage at [email or web address], password [facility] 09/23/2014, 1:16 PM  LOS: 2 days

## 2014-09-23 NOTE — Clinical Social Work Note (Signed)
CSW met with patient and fiance at bedside to discuss SNF recommendation. Patient and fiance state they do not want to discuss this with CSW and asks that CSW come back at a later time, as patient's blood pressure has dropped significantly and she needs to rest. CSW provided patient and fiance with list of facilities and stated that unit CSW will be back to discuss SNF placement process with them as appropriate.   Liz Beach MSW, [place], [place], [phone]

## 2014-09-23 NOTE — Evaluation (Signed)
Physical Therapy Evaluation Patient Details Name: [name] MRN: [id number] DOB: 1926/09/16 Today's Date: 09/23/2014   History of Present Illness  Pt is an 81 y.o. female who fell at home sustaining a R hip fx.  She underwent R hemiarthroplasty 09-22-14.  Clinical Impression  Pt admitted with above diagnosis. Pt currently with functional limitations due to the deficits listed below (see PT Problem List).  Pt will benefit from skilled PT to increase their independence and safety with mobility to allow discharge to the venue listed below.       Follow Up Recommendations SNF;Supervision/Assistance - 24 hour    Equipment Recommendations  Rolling walker with 5" wheels    Recommendations for Other Services       Precautions / Restrictions Precautions Precautions: Posterior Hip;Fall Precaution Comments: Pt educated on 3/3 hip precautions. Restrictions Weight Bearing Restrictions: Yes RLE Weight Bearing: Partial weight bearing      Mobility  Bed Mobility Overal bed mobility: Needs Assistance Bed Mobility: Supine to Sit     Supine to sit: Mod assist;HOB elevated     General bed mobility comments: use of bedrail; verbal cues for sequencing and precautions  Transfers Overall transfer level: Needs assistance Equipment used: Rolling walker (2 wheeled) Transfers: Sit to/from [facility] Sit to Stand: Mod assist Stand pivot transfers: Mod assist       General transfer comment: continuous verbal cues for sequencing  Ambulation/Gait                Stairs            Wheelchair Mobility    Modified Rankin (Stroke Patients Only)       Balance Overall balance assessment: Needs assistance Sitting-balance support: Single extremity supported;Feet supported Sitting balance-Leahy Scale: Fair     Standing balance support: Bilateral upper extremity supported;During functional activity Standing balance-Leahy Scale: Poor                               Pertinent Vitals/Pain Pain Assessment: 0-10 Pain Score: 5  Pain Location: R hip Pain Intervention(s): RN gave pain meds during session;Repositioned;Monitored during session    [place] expects to be discharged to:: Private residence Living Arrangements: Spouse/significant other Available Help at Discharge: Friend(s);Available 24 hours/day Type of Home: House Home Access: Level entry     Home Layout: One level;Laundry or work area in [place]: Kasandra Knudsen - single point;Walker - 2 wheels      Prior Function Level of Independence: Independent with assistive device(s)               Hand Dominance        Extremity/Trunk Assessment               Lower Extremity Assessment: RLE deficits/detail         Communication   Communication: No difficulties  Cognition Arousal/Alertness: Awake/alert Behavior During Therapy: WFL for tasks assessed/performed Overall Cognitive Status: Within Functional Limits for tasks assessed                      General Comments      Exercises        Assessment/Plan    PT Assessment Patient needs continued PT services  PT Diagnosis Difficulty walking;Generalized weakness;Acute pain   PT Problem List Decreased strength;Decreased activity tolerance;Decreased balance;Decreased mobility;Pain;Decreased knowledge of precautions;Decreased safety awareness;Decreased knowledge of use of DME  PT Treatment Interventions DME  instruction;Gait training;Functional mobility training;Therapeutic activities;Therapeutic exercise;Patient/family education;Balance training   PT Goals (Current goals can be found in the Care Plan section) Acute Rehab PT Goals Patient Stated Goal: not stated PT Goal Formulation: With patient Time For Goal Achievement: 09/23/14 Potential to Achieve Goals: Good    Frequency Min 3X/week   Barriers to discharge        Co-evaluation               End of  Session Equipment Utilized During Treatment: Gait belt Activity Tolerance: Patient limited by pain Patient left: in chair;with call bell/phone within reach Nurse Communication: Mobility status         Time: 9480-1655 PT Time Calculation (min) (ACUTE ONLY): 39 min   Charges:   PT Evaluation $Initial PT Evaluation Tier I: 1 Procedure PT Treatments $Therapeutic Activity: 23-37 mins   PT G Codes:        Lorriane Shire 09/23/2014, 10:05 AM

## 2014-09-23 NOTE — Progress Notes (Signed)
Utilization review completed.  

## 2014-09-23 NOTE — Progress Notes (Signed)
Patient ID: [name], female   DOB: [birth year], 81 y.o.   MRN: [id number] Subjective: 1 Day Post-Op Procedure(s) (LRB): ARTHROPLASTY BIPOLAR HIP (Right)    Patient reports pain as mild this am but sleeping.  No events recorded or reported.  Objective:   VITALS:   Filed Vitals:   09/23/14 0515  BP: 142/34  Pulse: 86  Temp: 98.9 F (37.2 C)  Resp: 20    Neurovascular intact Incision: dressing C/D/I  LABS  Recent Labs  09/21/14 1253 09/22/14 0555 09/23/14 0400  HGB 13.3 12.9 12.1  HCT 40.1 39.3 37.7  WBC 12.1* 8.0 12.2*  PLT 164 146* 171     Recent Labs  09/21/14 1253 09/22/14 0555 09/23/14 0400  NA 136 137 136  K 3.5 3.7 4.1  BUN 16 13 11   CREATININE 0.90 0.82 0.92  GLUCOSE 108* 107* 133*     Recent Labs  09/21/14 1253  INR 1.10     Assessment/Plan: 1 Day Post-Op Procedure(s) (LRB): ARTHROPLASTY BIPOLAR HIP (Right)   Advance diet Up with therapy Discharge to SNF when applicable  Will follow RTC in 2 weeks for wound check and X-rays

## 2014-09-24 DIAGNOSIS — E860 Dehydration: Secondary | ICD-10-CM

## 2014-09-24 DIAGNOSIS — K5900 Constipation, unspecified: Secondary | ICD-10-CM

## 2014-09-24 LAB — BASIC METABOLIC PANEL
ANION GAP: 5 (ref 5–15)
BUN: 11 mg/dL (ref 6–23)
CALCIUM: 8.3 mg/dL — AB (ref 8.4–10.5)
CHLORIDE: 102 mmol/L (ref 96–112)
CO2: 29 mmol/L (ref 19–32)
Creatinine, Ser: 0.76 mg/dL (ref 0.50–1.10)
GFR calc non Af Amer: 75 mL/min — ABNORMAL LOW (ref >90)
GFR, EST AFRICAN AMERICAN: 87 mL/min — AB (ref >90)
Glucose, Bld: 112 mg/dL — ABNORMAL HIGH (ref 70–99)
POTASSIUM: 4.3 mmol/L (ref 3.5–5.1)
Sodium: 136 mmol/L (ref 135–145)

## 2014-09-24 LAB — CBC
HEMATOCRIT: 33.9 % — AB (ref 36.0–46.0)
Hemoglobin: 11.1 g/dL — ABNORMAL LOW (ref 12.0–15.0)
MCH: 30.8 pg (ref 26.0–34.0)
MCHC: 32.7 g/dL (ref 30.0–36.0)
MCV: 94.2 fL (ref 78.0–100.0)
Platelets: 183 10*3/uL (ref 150–400)
RBC: 3.6 MIL/uL — AB (ref 3.87–5.11)
RDW: 13.9 % (ref 11.5–15.5)
WBC: 12.3 10*3/uL — ABNORMAL HIGH (ref 4.0–10.5)

## 2014-09-24 MED ORDER — HYDROCODONE-ACETAMINOPHEN 5-325 MG PO TABS: 1.0000 | ORAL_TABLET | Freq: Four times a day (QID) | ORAL | Status: DC | PRN

## 2014-09-24 MED ORDER — ENOXAPARIN SODIUM 40 MG/0.4ML ~~LOC~~ SOLN: 40.0000 mg | SUBCUTANEOUS | Status: DC

## 2014-09-24 MED ORDER — SODIUM CHLORIDE 0.9 % IV BOLUS (SEPSIS)
500.0000 mL | Freq: Once | INTRAVENOUS | Status: AC
  Administered 2014-09-24: 500 mL via INTRAVENOUS

## 2014-09-24 NOTE — Clinical Social Work Psychosocial (Signed)
     Clinical Social Work Department BRIEF PSYCHOSOCIAL ASSESSMENT 09/24/2014  Patient:  [name], [name]     Account Number:  [id number]     Admit date:  09/21/2014  Clinical Social Worker:  Adair Laundry  Date/Time:  09/24/2014 10:29 AM  Referred by:  Physician  Date Referred:  09/24/2014 Referred for  SNF Placement   Other Referral:   Interview type:  Patient Other interview type:    PSYCHOSOCIAL DATA Living Status:  SIGNIFICANT OTHER Admitted from facility:   Level of care:   Primary support name:  Letitia Neri Primary support relationship to patient:  PARTNER Degree of support available:   Pt has strong emotional support from her fiance [place] Current Concerns  Post-Acute Placement   Other Concerns:    [place] / PLAN CSW visited pt room to discuss [place] rehab. Pt with flat affect and not in good spirits. Pt reported she is aware of recommendation and stated "I guess I have to do that". Pt expressed that she feels she has no other choice. CSW did explaint that pt has the right to refuse but [facility] rehab is what is recommended. Pt clarified that she understands this is what she needs but it is frustrating that she cannot return home. Pt expressed that her only experience with a SNF was when her mother went to [facility] and passed away there. CSW offered pt support. CSW explained SNF referral process and pt is agreeable to referral being sent to all [facility]. Pt expressed that [facility] would be the most convenient but unclear if this is a facility she would be happy at because of past experience. CSW informed pt that she would be provided with all bed offers and can make her decision after that.   Assessment/plan status:  Psychosocial Support/Ongoing Assessment of Needs Other assessment/ plan:   Information/referral to community resources:   SNF list to be provided with bed offers    PATIENTS/FAMILYS RESPONSE TO  PLAN OF CARE: Pt reports that she is not doing well. Pt upset that she will require SNF at dc but understanding and agreeable.      Juergen Hardenbrook, LCSWA

## 2014-09-24 NOTE — Progress Notes (Signed)
Physical Therapy Treatment Patient Details Name: [name] MRN: [id number] DOB: 1927-03-07 Today's Date: 09/24/2014    History of Present Illness Pt is an 81 y.o. female who fell at home sustaining a R hip fx.  She underwent R hemiarthroplasty 09-22-14.    PT Comments    Pt progressing slowly, ambulated 8' with min A and mod vc's today. O2 sats decreased to 84% with activity on RA, up to 97% on 2L. Pt continues with fatigue and mild dizziness. PT will continue to follow.   Follow Up Recommendations  SNF;Supervision/Assistance - 24 hour     Equipment Recommendations  Rolling walker with 5" wheels    Recommendations for Other Services       Precautions / Restrictions Precautions Precautions: Posterior Hip;Fall Precaution Booklet Issued: No Precaution Comments: pt could not recall hip precautions, reviewed 3/3 3x during session Restrictions Weight Bearing Restrictions: Yes RLE Weight Bearing: Partial weight bearing RLE Partial Weight Bearing Percentage or Pounds: pt reeducated on this as well    Mobility  Bed Mobility               General bed mobility comments: pt received sitting on BSC  Transfers Overall transfer level: Needs assistance Equipment used: Rolling walker (2 wheeled) Transfers: Sit to/from Stand Sit to Stand: Mod assist         General transfer comment: vc's for hand placement, mod A for power up  Ambulation/Gait Ambulation/Gait assistance: Min assist Ambulation Distance (Feet): 8 Feet Assistive device: Rolling walker (2 wheeled) Gait Pattern/deviations: Step-to pattern;Trunk flexed Gait velocity: very slow Gait velocity interpretation: <1.8 ft/sec, indicative of risk for recurrent falls General Gait Details: vc's for moving RW and each step, especially with turning. Pt fatigues very quickly with standing activity. Minimal tolerance for ambulation at this point   Stairs            Wheelchair Mobility    Modified Rankin (Stroke  Patients Only)       Balance Overall balance assessment: Needs assistance Sitting-balance support: Bilateral upper extremity supported;Feet supported Sitting balance-Leahy Scale: Fair     Standing balance support: Bilateral upper extremity supported;During functional activity Standing balance-Leahy Scale: Poor Standing balance comment: cannot stand without UE support currently                    Cognition Arousal/Alertness: Awake/alert Behavior During Therapy: WFL for tasks assessed/performed Overall Cognitive Status: Within Functional Limits for tasks assessed                      Exercises Total Joint Exercises Ankle Circles/Pumps: AROM;Both;20 reps;Seated Quad Sets: AROM;Both;10 reps;Seated Heel Slides: AROM;Right;10 reps;Seated Hip ABduction/ADduction: AAROM;Right;10 reps;Seated Long Arc Quad: AROM;Right;10 reps;Seated Knee Flexion: AROM;Right;10 reps;Seated    General Comments General comments (skin integrity, edema, etc.): pt's O2 sats 84% after treatment and pt c/o mild dizziness. 2L O2 applied and O2 sats 97% after this. HR 78 bpm      Pertinent Vitals/Pain Pain Assessment: 0-10 Pain Score: 5  Pain Location: right hip Pain Intervention(s): Monitored during session;Repositioned    Home Living                      Prior Function            PT Goals (current goals can now be found in the care plan section) Acute Rehab PT Goals Patient Stated Goal: does not want to go to rehab but knows that it is necessary  PT Goal Formulation: With patient Time For Goal Achievement: 10/07/14 Potential to Achieve Goals: Good Progress towards PT goals: Progressing toward goals    Frequency  Min 3X/week    PT Plan Current plan remains appropriate    Co-evaluation             End of Session Equipment Utilized During Treatment: Gait belt Activity Tolerance: Patient limited by fatigue Patient left: in chair;with call bell/phone within  reach     Time: 0939-1011 PT Time Calculation (min) (ACUTE ONLY): 32 min  Charges:  $Therapeutic Exercise: 8-22 mins $Therapeutic Activity: 8-22 mins                    G Codes:     Leighton Roach, PT  Acute Rehab Services  [phone]  Leighton Roach 09/24/2014, 10:33 AM

## 2014-09-24 NOTE — Progress Notes (Signed)
Patient ID: [name], female   DOB: 1927-05-15, 81 y.o.   MRN: [id number] Subjective: 2 Days Post-Op Procedure(s) (LRB): ARTHROPLASTY BIPOLAR HIP (Right)    Patient reports pain as mild.  No events reported  Objective:   VITALS:   Filed Vitals:   09/24/14 0519  BP: 118/44  Pulse: 84  Temp: 97.9 F (36.6 C)  Resp: 18    Neurovascular intact Incision: dressing C/D/I  LABS  Recent Labs  09/22/14 0555 09/23/14 0400 09/24/14 0538  HGB 12.9 12.1 11.1*  HCT 39.3 37.7 33.9*  WBC 8.0 12.2* 12.3*  PLT 146* 171 183     Recent Labs  09/22/14 0555 09/23/14 0400 09/24/14 0538  NA 137 136 136  K 3.7 4.1 4.3  BUN 13 11 11   CREATININE 0.82 0.92 0.76  GLUCOSE 107* 133* 112*     Recent Labs  09/21/14 1253  INR 1.10     Assessment/Plan: 2 Days Post-Op Procedure(s) (LRB): ARTHROPLASTY BIPOLAR HIP (Right)   Up with therapy Discharge to SNF when medically cleared  RTC in 2 weeks for wound check Instructions completed

## 2014-09-24 NOTE — Progress Notes (Signed)
CSW (Clinical Education officer, museum) spoke with [facility] admissions and notified of pt bed acceptance. Facility confirmed they can accept pt tomorrow if pt is medically stable for discharge.  [place], [place]

## 2014-09-24 NOTE — Clinical Social Work Placement (Addendum)
    Clinical Social Work Department CLINICAL SOCIAL WORK PLACEMENT NOTE 09/25/2014  Patient:  [name], [name]  Account Number:  [id number] Admit date:  09/21/2014  Clinical Social Worker:  Adair Laundry  Date/time:  09/24/2014 10:43 AM  Clinical Social Work is seeking post-discharge placement for this patient at the following level of care:   [place]   (*CSW will update this form in Epic as items are completed)   09/24/2014  Patient/family provided with [place] Department of Clinical Social Work's list of facilities offering this level of care within the geographic area requested by the patient (or if unable, by the patient's family).  09/24/2014  Patient/family informed of their freedom to choose among providers that offer the needed level of care, that participate in Medicare, Medicaid or managed care program needed by the patient, have an available bed and are willing to accept the patient.  09/24/2014  Patient/family informed of [facility]' ownership interest in [facility], as well as of the fact that they are under no obligation to receive care at this facility.  PASARR submitted to EDS on 09/24/2014 PASARR number received on 09/24/2014  FL2 transmitted to all facilities in geographic area requested by pt/family on  09/24/2014 FL2 transmitted to all facilities within larger geographic area on   Patient informed that his/her managed care company has contracts with or will negotiate with  certain facilities, including the following:   [facility]- Medicare Complete     Patient/family informed of bed offers received:  09/24/2014 Patient chooses bed at [facility] Physician recommends and patient chooses bed at    Patient to be transferred to [facility] on  09/25/2014 Patient to be transferred to facility by [facility]) Patient and family notified of transfer on 09/25/2014 Name of family member notified:  Patient  states she notified her fiance. Declined CSW to call  The following physician request were entered in Epic: Physician Request  Please sign FL2.  Please prepare priority discharge summary and prescriptions.    Additional Comments: Poonum Ambelal, LCSWA 09/25/14.  Ok per MD for d/c today to SNF at [facility]. BSW Intern facilitated d/c with patient, facility and nursing.  Patient requested placement at [facility] and was pleased with d/c there. Nursing called report. CSW signing off.

## 2014-09-24 NOTE — Care Management Note (Signed)
    Page 1 of 1   09/26/2014     2:59:54 PM CARE MANAGEMENT NOTE 09/26/2014  Patient:  [name], [name]   Account Number:  [id number]  Date Initiated:  09/24/2014  Documentation initiated by:  Tradarius Reinwald  Subjective/Objective Assessment:   Pt adm on 09/21/14 with rt hip fracture.  PTA, pt independent and from home with fiance.     Action/Plan:   PT recommending SNF at dc; CSW consulted to facilitate dc to SNF when medically stable.  Will follow progress.   Anticipated DC Date:  09/25/2014   Anticipated DC Plan:  SKILLED NURSING FACILITY  In-house referral  Clinical Social Worker      DC Planning Services  CM consult      Choice offered to / List presented to:             Status of service:  Completed, signed off Medicare Important Message given?  YES (If response is "NO", the following Medicare IM given date fields will be blank) Date Medicare IM given:  09/24/2014 Medicare IM given by:  Nafis Farnan Date Additional Medicare IM given:   Additional Medicare IM given by:    Discharge Disposition:  [place]  Per UR Regulation:  Reviewed for med. necessity/level of care/duration of stay  If discussed at [place] of Stay Meetings, dates discussed:    Comments:  09/25/14 Ellan Lambert, RN, BSN [phone] Pt discharged to SNF today, per CSW arrangements.

## 2014-09-24 NOTE — Discharge Instructions (Signed)
PWB RLE May shower Maintain current dressing until follow up as it is water proof and designed to remain in place Call [phone] for Orthopaedic questions or concerns

## 2014-09-24 NOTE — Progress Notes (Signed)
[facility] PROGRESS NOTE  [name] FGH:[id number] DOB: 14-Jan-1927 DOA: 09/21/2014 PCP: [name], MD  Assessment/Plan: 1. Right displaced femoral neck fracture.  -Patient having mechanical fall at home, denied loss of consciousness or symptoms prior to event. -Imaging studies revealed the presence of a right displaced femoral neck fracture -She was taken to the operating room on 09/22/2014 undergoing right hip hemiarthroplasty. -Will need physical therapy/occupational therapy along with social work consult for skilled nursing facility placement -SW consult -Anticipate discharge to SNF in the next 24 hours  2.  Constipation -Patient reporting not having a bowel movement in 2-3 days -Will continue Colace and Miralax -Immobility and meds likely contributing to her constipation  -Was given enema overnight that led to a large bowel movement.  3.  Chronic obstructive pulmonary disease -Patient not having any active issues  4.  Pain management -Continue oxycodone 5 mg by mouth every 4 hours when necessary and morphine 1 mg IV every 3 hours as needed for severe breakthrough pain  5.  Frequent PVC's -Telemetry showing frequent PVC's, could be related to her history of lung disease, she denies CP or SOB -Gave 12.5 mg of Metoprolol however she developed hypotension for which this was stopped.  6. Hypotension -I suspect patient may be dehydrated as she reports feeling dizzy with sitting up -She was given a liter of NS yesterday, will give 500 mL today    7. Will d/c foley cath  Code Status: DO NOT RESUSCITATE Family Communication: Spoke with patient's fianc Disposition Plan: Anticipate discharge to SNF in the next 24 hours   Consultants:  Orthopedic surgery  Procedures:  Right hip hemiarthroplasty performed on 09/22/2014   HPI/Subjective: Patient is a pleasant 81 year old female with a past medical history of chronic obstructive pulmonary disease, chronic  diastolic congestive heart failure, admitted to the medicine service on 09/21/2014 presented with complaints of right hip pain sustaining a fall 2 days prior to hospitalization. Imaging studies revealed the presence of a displaced femoral neck fracture to right side. She was evaluated by orthopedic surgery, taken to the operating room on 09/22/2014 undergoing right hip hemiarthroplasty.  Objective: Filed Vitals:   09/24/14 0519  BP: 118/44  Pulse: 84  Temp: 97.9 F (36.6 C)  Resp: 18    Intake/Output Summary (Last 24 hours) at 09/24/14 1453 Last data filed at 09/24/14 1300  Gross per 24 hour  Intake   1080 ml  Output   1005 ml  Net     75 ml   Filed Weights   09/22/14 0504 09/23/14 0515 09/24/14 0519  Weight: 61.85 kg (136 lb 5.7 oz) 62.4 kg (137 lb 9.1 oz) 61.7 kg (136 lb 0.4 oz)    Exam:   General:  Patient reporting ongoing pain to her right hip, she reports not feeling well though is somewhat sedated from narcotic analgesics   Cardiovascular: Regular rate and rhythm normal S1-S2  Respiratory: Clear to auscultation bilaterally no wheezing rhonchi rales  Abdomen: Soft nontender nondistended positive bowel sounds  Musculoskeletal: Surgical incision site appears clean without evidence of hematoma or infection  Data Reviewed: Basic Metabolic Panel:  Recent Labs Lab 09/21/14 1253 09/22/14 0555 09/23/14 0400 09/24/14 0538  NA 136 137 136 136  K 3.5 3.7 4.1 4.3  CL 101 103 100 102  CO2 28 31 31 29   GLUCOSE 108* 107* 133* 112*  BUN 16 13 11 11   CREATININE 0.90 0.82 0.92 0.76  CALCIUM 8.5 7.9* 8.2* 8.3*   Liver Function  Tests: No results for input(s): AST, ALT, ALKPHOS, BILITOT, PROT, ALBUMIN in the last 168 hours. No results for input(s): LIPASE, AMYLASE in the last 168 hours. No results for input(s): AMMONIA in the last 168 hours. CBC:  Recent Labs Lab 09/21/14 1253 09/22/14 0555 09/23/14 0400 09/24/14 0538  WBC 12.1* 8.0 12.2* 12.3*  NEUTROABS 9.9*  --    --   --   HGB 13.3 12.9 12.1 11.1*  HCT 40.1 39.3 37.7 33.9*  MCV 93.0 94.2 95.7 94.2  PLT 164 146* 171 183   Cardiac Enzymes: No results for input(s): CKTOTAL, CKMB, CKMBINDEX, TROPONINI in the last 168 hours. BNP (last 3 results) No results for input(s): BNP in the last 8760 hours.  ProBNP (last 3 results) No results for input(s): PROBNP in the last 8760 hours.  CBG: No results for input(s): GLUCAP in the last 168 hours.  Recent Results (from the past 240 hour(s))  Surgical pcr screen     Status: None   Collection Time: 09/21/14 10:57 PM  Result Value Ref Range Status   MRSA, PCR NEGATIVE NEGATIVE Final   Staphylococcus aureus NEGATIVE NEGATIVE Final    Comment:        The Xpert SA Assay (FDA approved for NASAL specimens in patients over 46 years of age), is one component of a comprehensive surveillance program.  Test performance has been validated by [facility] for patients greater than or equal to 85 year old. It is not intended to diagnose infection nor to guide or monitor treatment.      Studies: No results found.  Scheduled Meds: . docusate sodium  100 mg Oral BID  . enoxaparin (LOVENOX) injection  40 mg Subcutaneous Q24H  . levothyroxine  75 mcg Oral QAC breakfast  . oxyCODONE  5 mg Oral Once  . sodium chloride  500 mL Intravenous Once   Continuous Infusions: . sodium chloride 50 mL/hr at 09/21/14 1727  . sodium chloride 0.9 % 1,000 mL with potassium chloride 10 mEq infusion      Principal Problem:   Hip fracture Active Problems:   Bronchiectasis without acute exacerbation   Fall at home    Time spent: 35 minutes    Kelvin Cellar  [facility] Pager [phone]. If 7PM-7AM, please contact night-coverage at [email or web address], password [facility] 09/24/2014, 2:53 PM  LOS: 3 days

## 2014-09-24 NOTE — Progress Notes (Signed)
Pt foley removed this am per MD order and pt voiding in [facility] with assistance since removal of foley.  Reported off to incoming RN. Francis Gaines Alaiyah Bollman RN.

## 2014-09-24 NOTE — Progress Notes (Signed)
OT Cancellation Note  Patient Details Name: [name] MRN: [id number] DOB: January 12, 1927   Cancelled Treatment:    Reason Eval/Treat Not Completed: Other (comment) Pt is Medicare and current D/C plan is SNF. No apparent immediate acute care OT needs, therefore will defer OT to SNF. If OT eval is needed please call Acute Rehab Dept. at [phone] or text page OT at [phone]. Marland Kitchen  Almon Register [phone] 09/24/2014, 7:47 AM

## 2014-09-25 ENCOUNTER — Encounter (HOSPITAL_COMMUNITY): Payer: Self-pay | Admitting: Orthopedic Surgery

## 2014-09-25 DIAGNOSIS — T8189XA Other complications of procedures, not elsewhere classified, initial encounter: Secondary | ICD-10-CM | POA: Diagnosis not present

## 2014-09-25 DIAGNOSIS — T84029A Dislocation of unspecified internal joint prosthesis, initial encounter: Secondary | ICD-10-CM | POA: Diagnosis not present

## 2014-09-25 DIAGNOSIS — Y92099 Unspecified place in other non-institutional residence as the place of occurrence of the external cause: Secondary | ICD-10-CM | POA: Diagnosis not present

## 2014-09-25 DIAGNOSIS — S72001A Fracture of unspecified part of neck of right femur, initial encounter for closed fracture: Secondary | ICD-10-CM | POA: Diagnosis not present

## 2014-09-25 DIAGNOSIS — B9620 Unspecified Escherichia coli [E. coli] as the cause of diseases classified elsewhere: Secondary | ICD-10-CM | POA: Diagnosis not present

## 2014-09-25 DIAGNOSIS — J449 Chronic obstructive pulmonary disease, unspecified: Secondary | ICD-10-CM | POA: Diagnosis not present

## 2014-09-25 DIAGNOSIS — E43 Unspecified severe protein-calorie malnutrition: Secondary | ICD-10-CM | POA: Diagnosis present

## 2014-09-25 DIAGNOSIS — S7291XD Unspecified fracture of right femur, subsequent encounter for closed fracture with routine healing: Secondary | ICD-10-CM | POA: Diagnosis not present

## 2014-09-25 DIAGNOSIS — T40605A Adverse effect of unspecified narcotics, initial encounter: Secondary | ICD-10-CM | POA: Diagnosis present

## 2014-09-25 DIAGNOSIS — K5900 Constipation, unspecified: Secondary | ICD-10-CM | POA: Diagnosis not present

## 2014-09-25 DIAGNOSIS — Z96649 Presence of unspecified artificial hip joint: Secondary | ICD-10-CM | POA: Diagnosis not present

## 2014-09-25 DIAGNOSIS — M21251 Flexion deformity, right hip: Secondary | ICD-10-CM | POA: Diagnosis not present

## 2014-09-25 DIAGNOSIS — M79604 Pain in right leg: Secondary | ICD-10-CM | POA: Diagnosis not present

## 2014-09-25 DIAGNOSIS — J479 Bronchiectasis, uncomplicated: Secondary | ICD-10-CM | POA: Diagnosis not present

## 2014-09-25 DIAGNOSIS — Z96641 Presence of right artificial hip joint: Secondary | ICD-10-CM | POA: Diagnosis not present

## 2014-09-25 DIAGNOSIS — W19XXXA Unspecified fall, initial encounter: Secondary | ICD-10-CM | POA: Diagnosis not present

## 2014-09-25 DIAGNOSIS — E785 Hyperlipidemia, unspecified: Secondary | ICD-10-CM | POA: Diagnosis present

## 2014-09-25 DIAGNOSIS — R262 Difficulty in walking, not elsewhere classified: Secondary | ICD-10-CM | POA: Diagnosis not present

## 2014-09-25 DIAGNOSIS — M797 Fibromyalgia: Secondary | ICD-10-CM | POA: Diagnosis present

## 2014-09-25 DIAGNOSIS — Y831 Surgical operation with implant of artificial internal device as the cause of abnormal reaction of the patient, or of later complication, without mention of misadventure at the time of the procedure: Secondary | ICD-10-CM | POA: Diagnosis present

## 2014-09-25 DIAGNOSIS — R52 Pain, unspecified: Secondary | ICD-10-CM | POA: Diagnosis not present

## 2014-09-25 DIAGNOSIS — Z7951 Long term (current) use of inhaled steroids: Secondary | ICD-10-CM | POA: Diagnosis not present

## 2014-09-25 DIAGNOSIS — I5032 Chronic diastolic (congestive) heart failure: Secondary | ICD-10-CM | POA: Diagnosis present

## 2014-09-25 DIAGNOSIS — M25551 Pain in right hip: Secondary | ICD-10-CM | POA: Diagnosis not present

## 2014-09-25 DIAGNOSIS — T84040A Periprosthetic fracture around internal prosthetic right hip joint, initial encounter: Secondary | ICD-10-CM | POA: Diagnosis present

## 2014-09-25 DIAGNOSIS — N3000 Acute cystitis without hematuria: Secondary | ICD-10-CM | POA: Diagnosis present

## 2014-09-25 DIAGNOSIS — Z471 Aftercare following joint replacement surgery: Secondary | ICD-10-CM | POA: Diagnosis not present

## 2014-09-25 DIAGNOSIS — M810 Age-related osteoporosis without current pathological fracture: Secondary | ICD-10-CM | POA: Diagnosis present

## 2014-09-25 DIAGNOSIS — S72121D Displaced fracture of lesser trochanter of right femur, subsequent encounter for closed fracture with routine healing: Secondary | ICD-10-CM | POA: Diagnosis not present

## 2014-09-25 DIAGNOSIS — D649 Anemia, unspecified: Secondary | ICD-10-CM | POA: Diagnosis not present

## 2014-09-25 DIAGNOSIS — E039 Hypothyroidism, unspecified: Secondary | ICD-10-CM | POA: Diagnosis present

## 2014-09-25 DIAGNOSIS — J439 Emphysema, unspecified: Secondary | ICD-10-CM | POA: Diagnosis not present

## 2014-09-25 DIAGNOSIS — F419 Anxiety disorder, unspecified: Secondary | ICD-10-CM | POA: Diagnosis present

## 2014-09-25 DIAGNOSIS — T888XXA Other specified complications of surgical and medical care, not elsewhere classified, initial encounter: Secondary | ICD-10-CM | POA: Diagnosis not present

## 2014-09-25 DIAGNOSIS — Z79899 Other long term (current) drug therapy: Secondary | ICD-10-CM | POA: Diagnosis not present

## 2014-09-25 DIAGNOSIS — R278 Other lack of coordination: Secondary | ICD-10-CM | POA: Diagnosis not present

## 2014-09-25 DIAGNOSIS — M6281 Muscle weakness (generalized): Secondary | ICD-10-CM | POA: Diagnosis not present

## 2014-09-25 DIAGNOSIS — F329 Major depressive disorder, single episode, unspecified: Secondary | ICD-10-CM | POA: Diagnosis present

## 2014-09-25 DIAGNOSIS — N390 Urinary tract infection, site not specified: Secondary | ICD-10-CM | POA: Diagnosis not present

## 2014-09-25 DIAGNOSIS — K5909 Other constipation: Secondary | ICD-10-CM | POA: Diagnosis present

## 2014-09-25 DIAGNOSIS — J471 Bronchiectasis with (acute) exacerbation: Secondary | ICD-10-CM | POA: Diagnosis not present

## 2014-09-25 DIAGNOSIS — I2510 Atherosclerotic heart disease of native coronary artery without angina pectoris: Secondary | ICD-10-CM | POA: Diagnosis present

## 2014-09-25 DIAGNOSIS — R9431 Abnormal electrocardiogram [ECG] [EKG]: Secondary | ICD-10-CM | POA: Diagnosis not present

## 2014-09-25 DIAGNOSIS — R41841 Cognitive communication deficit: Secondary | ICD-10-CM | POA: Diagnosis not present

## 2014-09-25 DIAGNOSIS — D62 Acute posthemorrhagic anemia: Secondary | ICD-10-CM | POA: Diagnosis not present

## 2014-09-25 DIAGNOSIS — J441 Chronic obstructive pulmonary disease with (acute) exacerbation: Secondary | ICD-10-CM | POA: Diagnosis not present

## 2014-09-25 LAB — CBC
HCT: 32.5 % — ABNORMAL LOW (ref 36.0–46.0)
Hemoglobin: 10.4 g/dL — ABNORMAL LOW (ref 12.0–15.0)
MCH: 30.2 pg (ref 26.0–34.0)
MCHC: 32 g/dL (ref 30.0–36.0)
MCV: 94.5 fL (ref 78.0–100.0)
Platelets: 177 10*3/uL (ref 150–400)
RBC: 3.44 MIL/uL — ABNORMAL LOW (ref 3.87–5.11)
RDW: 13.8 % (ref 11.5–15.5)
WBC: 7.7 10*3/uL (ref 4.0–10.5)

## 2014-09-25 LAB — BASIC METABOLIC PANEL
ANION GAP: 3 — AB (ref 5–15)
BUN: 10 mg/dL (ref 6–23)
CALCIUM: 8.2 mg/dL — AB (ref 8.4–10.5)
CHLORIDE: 102 mmol/L (ref 96–112)
CO2: 32 mmol/L (ref 19–32)
CREATININE: 0.71 mg/dL (ref 0.50–1.10)
GFR calc Af Amer: 89 mL/min — ABNORMAL LOW (ref >90)
GFR calc non Af Amer: 76 mL/min — ABNORMAL LOW (ref >90)
Glucose, Bld: 106 mg/dL — ABNORMAL HIGH (ref 70–99)
POTASSIUM: 4.4 mmol/L (ref 3.5–5.1)
Sodium: 137 mmol/L (ref 135–145)

## 2014-09-25 MED ORDER — HYDROCODONE-ACETAMINOPHEN 5-325 MG PO TABS: 1.0000 | ORAL_TABLET | Freq: Four times a day (QID) | ORAL | Status: DC | PRN

## 2014-09-25 MED ORDER — DOCUSATE SODIUM 100 MG PO CAPS: 100.0000 mg | ORAL_CAPSULE | Freq: Two times a day (BID) | ORAL | Status: DC

## 2014-09-25 NOTE — Discharge Summary (Signed)
Physician Discharge Summary  [name] PPJ:[id number] DOB: 1926-08-24 DOA: 09/21/2014  PCP: [name], MD  Admit date: 09/21/2014 Discharge date: 09/25/2014  Time spent: 35 minutes  Recommendations for Outpatient Follow-up:  1. Please follow up on a BMP and CBC in 3-4 days   Discharge Diagnoses:  Principal Problem:   Hip fracture Active Problems:   Bronchiectasis without acute exacerbation   Fall at home   Discharge Condition: Stable  Diet recommendation: Regular diet  Filed Weights   09/22/14 0504 09/23/14 0515 09/24/14 0519  Weight: 61.85 kg (136 lb 5.7 oz) 62.4 kg (137 lb 9.1 oz) 61.7 kg (136 lb 0.4 oz)    History of present illness:  [name] is a 81 y.o. female with a past medical history of chronic obstructive disease, chronic diastolic congestive heart failure, dyslipidemia who presents to the emergent department with complaints of right hip pain. She reports that 2 nights ago she lost her balance while kissing her fianc good night, falling backwards landing on her right hip on a hardwood floor. She reports experiencing severe right hip pain however got up and limped to her bed. She denied shortness of breath, palpitations, dizziness, lightheadedness, loss of consciousness associated with her fall. Patient reporting having ongoing severe right hip pain over the course of the day yesterday however stated that pain became unbearable for which she presented to the emergency room. Imaging studies showing a displaced femoral neck fracture on right side. Patient was seen and evaluated by Dr. Rolena Infante of orthopedic surgery, plan for right hemiarthroplasty in a.m.  Hospital Course:  Patient is a pleasant 81 year old female with a past medical history of chronic obstructive pulmonary disease, chronic diastolic congestive heart failure, admitted to the medicine service on 09/21/2014 presented with complaints of right hip pain sustaining a fall 2 days prior to  hospitalization. Imaging studies revealed the presence of a displaced femoral neck fracture to right side. She was evaluated by orthopedic surgery, taken to the operating room on 09/22/2014 undergoing right hip hemiarthroplasty.   Right displaced femoral neck fracture. -Patient having mechanical fall at home, denied loss of consciousness or symptoms prior to event. -Imaging studies revealed the presence of a right displaced femoral neck fracture -She was taken to the operating room on 09/22/2014 undergoing right hip hemiarthroplasty. -Will need physical therapy/occupational therapy along with social work consult for skilled nursing facility placement -SW consult -Anticipate discharge to SNF today -DVT prophylaxis: Lovenox 40 mg [place] x 12 days  2. Constipation -Patient reporting not having a bowel movement in 2-3 days -Immobility and meds likely contributing to her constipation  -Was given enema on 09/23/2014  that led to a large bowel movement.  3. Chronic obstructive pulmonary disease -Patient not having any active issues  4. Pain management -Will discharge on as needed percocet q 6 hours  Procedures:  Right hip hemiarthroplasty performed on 09/22/2014  Consultations:  Orthopedic surgery  Discharge Exam: Filed Vitals:   09/25/14 0800  BP:   Pulse:   Temp:   Resp: 18     General: Patient reporting ongoing pain to her right hip, seems better  Cardiovascular: Regular rate and rhythm normal S1-S2  Respiratory: Clear to auscultation bilaterally no wheezing rhonchi rales  Abdomen: Soft nontender nondistended positive bowel sounds  Musculoskeletal: Surgical incision site appears clean without evidence of hematoma or infection  Discharge Instructions   Discharge Instructions    Call MD for:  difficulty breathing, headache or visual disturbances    Complete by:  As directed      Call MD for:  extreme fatigue    Complete by:  As directed      Call MD for:  hives     Complete by:  As directed      Call MD for:  persistant dizziness or light-headedness    Complete by:  As directed      Call MD for:  persistant nausea and vomiting    Complete by:  As directed      Call MD for:  redness, tenderness, or signs of infection (pain, swelling, redness, odor or green/yellow discharge around incision site)    Complete by:  As directed      Call MD for:  severe uncontrolled pain    Complete by:  As directed      Call MD for:  temperature >100.4    Complete by:  As directed      Diet - low sodium heart healthy    Complete by:  As directed      Diet - low sodium heart healthy    Complete by:  As directed      Increase activity slowly    Complete by:  As directed      Increase activity slowly    Complete by:  As directed      Partial weight bearing    Complete by:  As directed   50%     Partial weight bearing    Complete by:  As directed   50%          Current Discharge Medication List    START taking these medications   Details  aspirin EC 325 MG tablet Take 1 tablet (325 mg total) by mouth 2 (two) times daily. Take for 4 weeks Qty: 60 tablet, Refills: 0    docusate sodium (COLACE) 100 MG capsule Take 1 capsule (100 mg total) by mouth 2 (two) times daily. Qty: 10 capsule, Refills: 0    enoxaparin (LOVENOX) 40 MG/0.4ML injection Inject 0.4 mLs (40 mg total) into the skin daily. Qty: 12 Syringe, Refills: 0    !! HYDROcodone-acetaminophen (NORCO) 5-325 MG per tablet Take 1-2 tablets by mouth every 6 (six) hours as needed. Qty: 90 tablet, Refills: 0    !! HYDROcodone-acetaminophen (NORCO/VICODIN) 5-325 MG per tablet Take 1-2 tablets by mouth every 6 (six) hours as needed for moderate pain. Qty: 15 tablet, Refills: 0     !! - Potential duplicate medications found. Please discuss with provider.    CONTINUE these medications which have NOT CHANGED   Details  albuterol (PROVENTIL HFA;VENTOLIN HFA) 108 (90 BASE) MCG/ACT inhaler Inhale 2 puffs into  the lungs every 6 (six) hours as needed. Qty: 1 Inhaler, Refills: 5    Cholecalciferol (VITAMIN D3) 1000 UNITS CAPS Take 1 capsule by mouth daily.      hydrocortisone (ANUSOL-HC) 25 MG suppository Place 25 mg rectally as needed.      hydroxypropyl methylcellulose (ISOPTO TEARS) 2.5 % ophthalmic solution Place 1 drop into both eyes as needed for dry eyes.     lansoprazole (PREVACID) 15 MG capsule Take 15 mg by mouth daily as needed.     levothyroxine (SYNTHROID, LEVOTHROID) 75 MCG tablet Take 75 mcg by mouth daily.      Multiple Vitamin (MULTIVITAMIN) capsule Take 1 capsule by mouth daily.      nitroGLYCERIN (NITROSTAT) 0.4 MG SL tablet Place 0.4 mg under the tongue every 5 (five) minutes as needed.  Polyethyl Glycol-Propyl Glycol (SYSTANE) 0.4-0.3 % SOLN Apply to eye as directed.      polyethylene glycol (MIRALAX / GLYCOLAX) packet Take 17 g by mouth daily as needed for mild constipation.       STOP taking these medications     Carica Papaya (PAPAYA PO)      clorazepate (TRANXENE) 7.5 MG tablet      cyanocobalamin (,VITAMIN B-12,) 1000 MCG/ML injection      fesoterodine (TOVIAZ) 4 MG TB24 tablet        Allergies  Allergen Reactions  . Fish Allergy Nausea And Vomiting    Extremely sick   Follow-up Information    Follow up with [name], MD On 10/05/2014.   Specialty:  Orthopedic Surgery   Why:  For wound check @10 :30 am spoke with Valere Dross information:   [address] [place] [address] [phone]       Follow up with [name], MD In 2 weeks.   Specialty:  Endocrinology   Why:  Left a message    Contact information:   [place]  [phone]        The results of significant diagnostics from this hospitalization (including imaging, microbiology, ancillary and laboratory) are listed below for reference.    Significant Diagnostic Studies: Dg Chest 2 View  09/21/2014   CLINICAL  DATA:  Right hip pain  EXAM: CHEST  2 VIEW  COMPARISON:  07/03/2014  FINDINGS: Normal heart size. There is no pleural effusion identified. No airspace consolidation. The lungs are hyperinflated and there are coarsened interstitial markings noted bilaterally. Bronchiectasis and scarring involving the right middle lobe and lingular portion the left lung are noted. The bones appear osteopenic and there is a scoliosis deformity involving the thoracic spine.  IMPRESSION: 1. No acute cardiopulmonary abnormalities. 2. Chronic interstitial changes.   Electronically Signed   By: Kerby Moors M.D.   On: 09/21/2014 12:37   Pelvis Portable  09/22/2014   CLINICAL DATA:  81 year old female with a history of right hip hemi arthroplasty.  EXAM: PORTABLE PELVIS 1-2 VIEWS  COMPARISON:  09/21/2014  FINDINGS: Interval surgical fixation of previously identified displaced right femoral neck fracture, with changes of right hip hemi arthroplasty.  Expected soft tissue swelling and gas at the surgical site.  No complicating features.  Remainder of the bony structures unremarkable.  IMPRESSION: Interval surgical changes of right hip hemi arthroplasty for fixation of previous identified displaced femoral neck fracture. No complicating features.  Signed,  Dulcy Fanny. Earleen Newport, DO  [facility]  [facility]   Electronically Signed   By: Corrie Mckusick D.O.   On: 09/22/2014 11:26   Dg Hip Unilat With Pelvis 2-3 Views Right  09/21/2014   CLINICAL DATA:  Hip pain  EXAM: RIGHT HIP (WITH PELVIS) 2-3 VIEWS  COMPARISON:  None  FINDINGS: There is a right subcapital femoral neck fracture. There is displacement of the distal fracture fragments proximally. Left hip appears located and intact.  IMPRESSION: Acute right subcapital femoral neck fracture.   Electronically Signed   By: Kerby Moors M.D.   On: 09/21/2014 12:38    Microbiology: Recent Results (from the past 240 hour(s))  Surgical pcr screen      Status: None   Collection Time: 09/21/14 10:57 PM  Result Value Ref Range Status   MRSA, PCR NEGATIVE NEGATIVE Final   Staphylococcus aureus NEGATIVE NEGATIVE Final    Comment:  The Xpert SA Assay (FDA approved for NASAL specimens in patients over 92 years of age), is one component of a comprehensive surveillance program.  Test performance has been validated by [facility] for patients greater than or equal to 75 year old. It is not intended to diagnose infection nor to guide or monitor treatment.      Labs: Basic Metabolic Panel:  Recent Labs Lab 09/21/14 1253 09/22/14 0555 09/23/14 0400 09/24/14 0538 09/25/14 0441  NA 136 137 136 136 137  K 3.5 3.7 4.1 4.3 4.4  CL 101 103 100 102 102  CO2 28 31 31 29  32  GLUCOSE 108* 107* 133* 112* 106*  BUN 16 13 11 11 10   CREATININE 0.90 0.82 0.92 0.76 0.71  CALCIUM 8.5 7.9* 8.2* 8.3* 8.2*   Liver Function Tests: No results for input(s): AST, ALT, ALKPHOS, BILITOT, PROT, ALBUMIN in the last 168 hours. No results for input(s): LIPASE, AMYLASE in the last 168 hours. No results for input(s): AMMONIA in the last 168 hours. CBC:  Recent Labs Lab 09/21/14 1253 09/22/14 0555 09/23/14 0400 09/24/14 0538 09/25/14 0441  WBC 12.1* 8.0 12.2* 12.3* 7.7  NEUTROABS 9.9*  --   --   --   --   HGB 13.3 12.9 12.1 11.1* 10.4*  HCT 40.1 39.3 37.7 33.9* 32.5*  MCV 93.0 94.2 95.7 94.2 94.5  PLT 164 146* 171 183 177   Cardiac Enzymes: No results for input(s): CKTOTAL, CKMB, CKMBINDEX, TROPONINI in the last 168 hours. BNP: BNP (last 3 results) No results for input(s): BNP in the last 8760 hours.  ProBNP (last 3 results) No results for input(s): PROBNP in the last 8760 hours.  CBG: No results for input(s): GLUCAP in the last 168 hours.     SignedKelvin Cellar  [facility] 09/25/2014, 11:10 AM

## 2014-09-26 ENCOUNTER — Other Ambulatory Visit: Payer: Self-pay | Admitting: *Deleted

## 2014-09-26 ENCOUNTER — Non-Acute Institutional Stay (SKILLED_NURSING_FACILITY): Payer: Medicare Other | Admitting: Internal Medicine

## 2014-09-26 DIAGNOSIS — J479 Bronchiectasis, uncomplicated: Secondary | ICD-10-CM | POA: Diagnosis not present

## 2014-09-26 DIAGNOSIS — J441 Chronic obstructive pulmonary disease with (acute) exacerbation: Secondary | ICD-10-CM

## 2014-09-26 DIAGNOSIS — Z96641 Presence of right artificial hip joint: Secondary | ICD-10-CM

## 2014-09-26 MED ORDER — HYDROCODONE-ACETAMINOPHEN 5-325 MG PO TABS: 1.0000 | ORAL_TABLET | Freq: Four times a day (QID) | ORAL | Status: DC | PRN

## 2014-09-30 NOTE — Progress Notes (Addendum)
Patient ID: [name], female   DOB: [birth year], 81 y.o.   MRN: [id number]                 HISTORY & PHYSICAL  DATE:  09/26/2014              FACILITY: [facility]        LEVEL OF CARE:   SNF   CHIEF COMPLAINT:  Admission to SNF, post stay at [facility], 09/21/2014 through 09/25/2014.      HISTORY OF PRESENT ILLNESS:  This is an independent, 81 year-old woman with a past history of COPD, chronic diastolic heart failure who presented to the emergency room two days after falling and hurting her right hip.  She apparently was still able to mobilize on this for two days.  Then, with a twisting motion, she probably separated the original impacted fracture and presented to the hospital.  She underwent a right hip hemiarthroplasty.  She appears to have tolerated things quite well.    At home, she states she walked with a cane.    She has COPD, using daily rescue inhalers, but was not on oxygen although she came out of the hospital on oxygen.    She tells me that she has chronic pain related to fibromyalgia, rheumatoid, and osteoarthritis.    PAST MEDICAL HISTORY/PROBLEM LIST:      Hyperlipidemia.     Irregular heartbeat.    Arthritis.    Fibromyalgia.    CHF.    Coronary artery disease.    Hiatal hernia.    COPD.    Osteoporosis.    Anxiety.    Depression.     Rotator cuff tear.    PAST SURGICAL HISTORY:                    Appendectomy.    Dilation and curettage of the uterus.    Elbow surgery.    Cataract extraction.    Recent hip arthroplasty.     CURRENT MEDICATIONS:  Medication list is reviewed.    Enteric-coated aspirin 325 b.i.d.   Take for four weeks.    Colace 100 b.i.d.        Lovenox 40 daily.    Norco 5/325, 1 tablet q.6 hours p.r.n.          Proventil HFA 2 puffs q.6.    Vitamin D3, 1000 U daily.    Anusol HC 25 rectally p.r.n.     Isopto Tears 2.5%, 1 drop into both eyes p.r.n. daily.      Prevacid 15 q.d.           Synthroid 75 q.d.          Multivitamin daily.    Nitroglycerin p.r.n.          MiraLAX 17 g p.r.n.          I note that clorazepate, vitamin B12 monthly, and Toviaz were stopped.    SOCIAL HISTORY:                   HOUSING:  The patient tells me that she lives in [place].  She has a basement in her home.   Does not need to go down there.   FUNCTIONAL STATUS:  Was using a walker.  I believe she was living alone, but has a "fianc".   She sounds very functional preoperatively.    REVIEW OF SYSTEMS:  CHEST/RESPIRATORY:  Had some shortness of breath postoperatively, but not currently.    CARDIAC:  No complaints of chest pain.    GI:  Bowels are moving normally.   GU:  Marked urinary frequency, even before the surgery.    EDEMA/VARICOSITIES:  Extremities:  Chronic venous issues in her legs.   MUSCULOSKELETAL:    Widespread complaints of pain, compatible with fibromyalgia.      PHYSICAL EXAMINATION:   VITAL SIGNS:     PULSE:  80.   RESPIRATIONS:  18.     02 SATURATIONS:  99% on 2 L.     GENERAL APPEARANCE:  Somewhat frail woman in no distress.   However, she is alert and able to give her own history.   HEENT:   MOUTH/THROAT:   Oral exam normal.     CHEST/RESPIRATORY:  Paradoxically clear air entry bilaterally.  No wheezing.  Work of breathing is normal.    There is no accessory muscle use.      CARDIOVASCULAR:   CARDIAC:  Heart sounds are normal.  She appears to be euvolemic.  There are no carotid bruits.     GASTROINTESTINAL:   ABDOMEN:  Soft.   LIVER/SPLEEN/KIDNEY:   No liver, no spleen.  No tenderness.    GENITOURINARY:   BLADDER:  Not palpably or percussibly enlarged.  There is no CVA tenderness.     CIRCULATION:   EDEMA/VARICOSITIES:  She has venous stasis roughly halfway between her knees and her ankles.  There is no evidence of a DVT.  Her upper thigh area is slightly larger on the right than the left.  Probably some postoperative blood loss.   SKIN:    INSPECTION:  Her incision looks remarkably well.    NEUROLOGICAL:   SENSATION/STRENGTH:  She has antigravity strength in both legs already.  I detect nothing focal here.   PSYCHIATRIC:   MENTAL STATUS:    Somewhat despondent.   However, I think this is a situational issue.   She answered my questions clearly and concisely.  I see no cognitive issues here.      ASSESSMENT/PLAN:                   Status post right total hip arthroplasty.  She is on aspirin for four weeks, as well as Lovenox.  I am not quite sure if this was what was intended.  I think substituting Xarelto here would probably be a better situation to spare the patient injections, and I am not certain about the efficacy of aspirin in this setting.   COPD.  I took her oxygen off.   I will continue to check this in the afternoon.  Her O2 sat was 96% on room air.   The patient states she actually has bronchiectasis with occasional bloody sputum.  This is noted.    Bilateral venous stasis without evidence of a DVT.     Osteoporosis.  If this is true, she is certainly not on an aggressive regimen for this.    Urinary frequency.  States she has been on a host of different medications for this, which have not helped.    Gastroesophageal reflux disease.  On Prevacid.    Again, for somebody with significant COPD, she is not on an aggressive regimen.  Maybe this is predominantly bronchiectasis.  In any case, there is no evidence currently of bronchospasm.   I have taken her off her oxygen and I will follow this   I see no  other issues here.  She is a good candidate for rehabilitation.   I have given her some information about statistics that applies to functional recovery after hip fracture surgery.    I will change her to Xarelto starting tomorrow.

## 2014-10-03 ENCOUNTER — Non-Acute Institutional Stay (SKILLED_NURSING_FACILITY): Payer: Medicare Other | Admitting: Internal Medicine

## 2014-10-03 DIAGNOSIS — J441 Chronic obstructive pulmonary disease with (acute) exacerbation: Secondary | ICD-10-CM

## 2014-10-03 DIAGNOSIS — Z96641 Presence of right artificial hip joint: Secondary | ICD-10-CM | POA: Diagnosis not present

## 2014-10-05 DIAGNOSIS — Z96641 Presence of right artificial hip joint: Secondary | ICD-10-CM | POA: Diagnosis not present

## 2014-10-05 DIAGNOSIS — Z471 Aftercare following joint replacement surgery: Secondary | ICD-10-CM | POA: Diagnosis not present

## 2014-10-08 NOTE — Progress Notes (Addendum)
Patient ID: [name], female   DOB: 07-28-1927, 81 y.o.   MRN: [id number]               PROGRESS NOTE  DATE:  10/03/2014            FACILITY: [facility]        LEVEL OF CARE:   SNF   Acute Visit   CHIEF COMPLAINT:  Review of multiple medical concerns.       HISTORY OF PRESENT ILLNESS:  This is an 81 year-old woman with a past history of COPD, chronic diastolic heart failure, who fell and broke her hip.  She underwent a right hip hemiarthroplasty.   There were some reports of draining coming from the right hip.  A culture of this was negative, although I do believe she was started on antibiotics.    When she arrived here, she was on both enteric-coated aspirin 325 b.i.d. with instructions to "take for four weeks", and Lovenox.  I saw no reason for this combination and substituted Xarelto.  Since then, she has seen an Xarelto commercial and is convinced she has all the  side effects.  She is concerned about edema in her lower legs, numbness in her lips, and the bleeding risk.    Finally, she is stating that she is supposed to be getting a monthly B12 injection, although I do not see that on her medication list that came from the hospital.    She came to [place] last week with oxygen on.  I was able to take this off.  Her lungs sounded clear.  Her sats have been maintained without any problems.    REVIEW OF SYSTEMS:   CHEST/RESPIRATORY:  No excess shortness of breath.   CARDIAC:   No clear chest pain.     GI:  No abdominal pain, nausea or vomiting.   MUSCULOSKELETAL:  Right hip:  She is still concerning about the swelling and pain.   EDEMA/VARICOSITIES:  Extremities:  Concerned about edema.    PHYSICAL EXAMINATION:   GENERAL APPEARANCE:  The patient looks worried, but not in any overt distress.   CHEST/RESPIRATORY:  Reasonably clear air entry bilaterally.  There are no crackles or wheezes.   CARDIOVASCULAR:  CARDIAC:   Heart sounds are normal.  There are no murmurs.     GASTROINTESTINAL:  LIVER/SPLEEN/KIDNEYS:  No liver, no spleen.  No tenderness.   MUSCULOSKELETAL:   EXTREMITIES:   RIGHT LOWER EXTREMITY:  Right hip incision:  This looks as good as I have seen any incision in quite some time.  There is no drainage, no evidence of infection, and no swelling.    CIRCULATION:   EDEMA/VARICOSITIES:  Extremities:  She has venous stasis and trivial degrees of edema.          ASSESSMENT/PLAN:                   The patient is status post right hip hemiarthroplasty.  Her incision looks fine.  I see no reason to be concerned here.  The culture of the drainage was negative and I see no reason for antibiotics.  She is on doxycycline.  I wonder whether this has to do with the taste she has in her mouth.  I think this is unnecessary and can stop.    COPD.  This is stable.     Edema.  This is trivial.  There is no evidence of a DVT.   She does have chronic  venous stasis.  I will give her TED stockings.

## 2014-10-10 ENCOUNTER — Inpatient Hospital Stay (HOSPITAL_COMMUNITY)
Admission: EM | Admit: 2014-10-10 | Discharge: 2014-10-17 | DRG: 466 | Disposition: A | Discharge status: Skilled Nursing Facility | Payer: Medicare Other | Attending: Internal Medicine | Admitting: Internal Medicine

## 2014-10-10 ENCOUNTER — Non-Acute Institutional Stay (SKILLED_NURSING_FACILITY): Payer: Medicare Other | Admitting: Internal Medicine

## 2014-10-10 ENCOUNTER — Emergency Department (HOSPITAL_COMMUNITY): Payer: Medicare Other

## 2014-10-10 ENCOUNTER — Encounter (HOSPITAL_COMMUNITY): Payer: Self-pay | Admitting: Emergency Medicine

## 2014-10-10 DIAGNOSIS — N390 Urinary tract infection, site not specified: Secondary | ICD-10-CM | POA: Diagnosis not present

## 2014-10-10 DIAGNOSIS — M797 Fibromyalgia: Secondary | ICD-10-CM | POA: Diagnosis present

## 2014-10-10 DIAGNOSIS — T84040A Periprosthetic fracture around internal prosthetic right hip joint, initial encounter: Secondary | ICD-10-CM | POA: Diagnosis not present

## 2014-10-10 DIAGNOSIS — E039 Hypothyroidism, unspecified: Secondary | ICD-10-CM | POA: Insufficient documentation

## 2014-10-10 DIAGNOSIS — Z7951 Long term (current) use of inhaled steroids: Secondary | ICD-10-CM

## 2014-10-10 DIAGNOSIS — N3000 Acute cystitis without hematuria: Secondary | ICD-10-CM | POA: Diagnosis not present

## 2014-10-10 DIAGNOSIS — S72009A Fracture of unspecified part of neck of unspecified femur, initial encounter for closed fracture: Secondary | ICD-10-CM | POA: Diagnosis present

## 2014-10-10 DIAGNOSIS — T888XXA Other specified complications of surgical and medical care, not elsewhere classified, initial encounter: Secondary | ICD-10-CM | POA: Diagnosis not present

## 2014-10-10 DIAGNOSIS — E43 Unspecified severe protein-calorie malnutrition: Secondary | ICD-10-CM | POA: Diagnosis not present

## 2014-10-10 DIAGNOSIS — Z09 Encounter for follow-up examination after completed treatment for conditions other than malignant neoplasm: Secondary | ICD-10-CM

## 2014-10-10 DIAGNOSIS — T40605A Adverse effect of unspecified narcotics, initial encounter: Secondary | ICD-10-CM | POA: Diagnosis present

## 2014-10-10 DIAGNOSIS — R109 Unspecified abdominal pain: Secondary | ICD-10-CM

## 2014-10-10 DIAGNOSIS — F419 Anxiety disorder, unspecified: Secondary | ICD-10-CM | POA: Diagnosis present

## 2014-10-10 DIAGNOSIS — T84029A Dislocation of unspecified internal joint prosthesis, initial encounter: Secondary | ICD-10-CM | POA: Diagnosis not present

## 2014-10-10 DIAGNOSIS — J449 Chronic obstructive pulmonary disease, unspecified: Secondary | ICD-10-CM | POA: Insufficient documentation

## 2014-10-10 DIAGNOSIS — S72121D Displaced fracture of lesser trochanter of right femur, subsequent encounter for closed fracture with routine healing: Secondary | ICD-10-CM | POA: Diagnosis not present

## 2014-10-10 DIAGNOSIS — Y831 Surgical operation with implant of artificial internal device as the cause of abnormal reaction of the patient, or of later complication, without mention of misadventure at the time of the procedure: Secondary | ICD-10-CM | POA: Diagnosis present

## 2014-10-10 DIAGNOSIS — E785 Hyperlipidemia, unspecified: Secondary | ICD-10-CM | POA: Diagnosis present

## 2014-10-10 DIAGNOSIS — M978XXA Periprosthetic fracture around other internal prosthetic joint, initial encounter: Secondary | ICD-10-CM

## 2014-10-10 DIAGNOSIS — Z79899 Other long term (current) drug therapy: Secondary | ICD-10-CM

## 2014-10-10 DIAGNOSIS — W19XXXA Unspecified fall, initial encounter: Secondary | ICD-10-CM

## 2014-10-10 DIAGNOSIS — J441 Chronic obstructive pulmonary disease with (acute) exacerbation: Secondary | ICD-10-CM | POA: Diagnosis present

## 2014-10-10 DIAGNOSIS — M9701XA Periprosthetic fracture around internal prosthetic right hip joint, initial encounter: Secondary | ICD-10-CM

## 2014-10-10 DIAGNOSIS — I5032 Chronic diastolic (congestive) heart failure: Secondary | ICD-10-CM | POA: Diagnosis not present

## 2014-10-10 DIAGNOSIS — R9431 Abnormal electrocardiogram [ECG] [EKG]: Secondary | ICD-10-CM | POA: Diagnosis not present

## 2014-10-10 DIAGNOSIS — J439 Emphysema, unspecified: Secondary | ICD-10-CM | POA: Diagnosis not present

## 2014-10-10 DIAGNOSIS — D62 Acute posthemorrhagic anemia: Secondary | ICD-10-CM | POA: Diagnosis not present

## 2014-10-10 DIAGNOSIS — Z96649 Presence of unspecified artificial hip joint: Secondary | ICD-10-CM

## 2014-10-10 DIAGNOSIS — K5909 Other constipation: Secondary | ICD-10-CM | POA: Diagnosis present

## 2014-10-10 DIAGNOSIS — B9620 Unspecified Escherichia coli [E. coli] as the cause of diseases classified elsewhere: Secondary | ICD-10-CM

## 2014-10-10 DIAGNOSIS — Z419 Encounter for procedure for purposes other than remedying health state, unspecified: Secondary | ICD-10-CM

## 2014-10-10 DIAGNOSIS — S72001A Fracture of unspecified part of neck of right femur, initial encounter for closed fracture: Secondary | ICD-10-CM

## 2014-10-10 DIAGNOSIS — F329 Major depressive disorder, single episode, unspecified: Secondary | ICD-10-CM | POA: Diagnosis present

## 2014-10-10 DIAGNOSIS — M810 Age-related osteoporosis without current pathological fracture: Secondary | ICD-10-CM | POA: Diagnosis present

## 2014-10-10 DIAGNOSIS — I2510 Atherosclerotic heart disease of native coronary artery without angina pectoris: Secondary | ICD-10-CM | POA: Diagnosis present

## 2014-10-10 DIAGNOSIS — M25551 Pain in right hip: Secondary | ICD-10-CM | POA: Diagnosis not present

## 2014-10-10 HISTORY — DX: Urinary tract infection, site not specified: N39.0

## 2014-10-10 HISTORY — DX: Adverse effect of unspecified anesthetic, initial encounter: T41.45XA

## 2014-10-10 HISTORY — DX: Other complications of anesthesia, initial encounter: T88.59XA

## 2014-10-10 LAB — CBC WITH DIFFERENTIAL/PLATELET
Basophils Absolute: 0 10*3/uL (ref 0.0–0.1)
Basophils Relative: 0 % (ref 0–1)
EOS ABS: 0.2 10*3/uL (ref 0.0–0.7)
EOS PCT: 2 % (ref 0–5)
HEMATOCRIT: 38.1 % (ref 36.0–46.0)
Hemoglobin: 12.2 g/dL (ref 12.0–15.0)
LYMPHS PCT: 9 % — AB (ref 12–46)
Lymphs Abs: 1 10*3/uL (ref 0.7–4.0)
MCH: 30.6 pg (ref 26.0–34.0)
MCHC: 32 g/dL (ref 30.0–36.0)
MCV: 95.5 fL (ref 78.0–100.0)
MONOS PCT: 8 % (ref 3–12)
Monocytes Absolute: 0.9 10*3/uL (ref 0.1–1.0)
Neutro Abs: 9.2 10*3/uL — ABNORMAL HIGH (ref 1.7–7.7)
Neutrophils Relative %: 81 % — ABNORMAL HIGH (ref 43–77)
PLATELETS: 259 10*3/uL (ref 150–400)
RBC: 3.99 MIL/uL (ref 3.87–5.11)
RDW: 14.5 % (ref 11.5–15.5)
WBC: 11.2 10*3/uL — ABNORMAL HIGH (ref 4.0–10.5)

## 2014-10-10 LAB — PROTIME-INR
INR: 1.2 (ref 0.00–1.49)
Prothrombin Time: 15.4 seconds — ABNORMAL HIGH (ref 11.6–15.2)

## 2014-10-10 LAB — BASIC METABOLIC PANEL
Anion gap: 8 (ref 5–15)
BUN: 14 mg/dL (ref 6–23)
CHLORIDE: 100 mmol/L (ref 96–112)
CO2: 27 mmol/L (ref 19–32)
Calcium: 8.7 mg/dL (ref 8.4–10.5)
Creatinine, Ser: 0.76 mg/dL (ref 0.50–1.10)
GFR calc Af Amer: 87 mL/min — ABNORMAL LOW (ref >90)
GFR calc non Af Amer: 75 mL/min — ABNORMAL LOW (ref >90)
Glucose, Bld: 136 mg/dL — ABNORMAL HIGH (ref 70–99)
Potassium: 4.6 mmol/L (ref 3.5–5.1)
SODIUM: 135 mmol/L (ref 135–145)

## 2014-10-10 MED ORDER — FENTANYL CITRATE 0.05 MG/ML IJ SOLN
25.0000 ug | Freq: Once | INTRAMUSCULAR | Status: AC
  Administered 2014-10-10: 25 ug via INTRAVENOUS
  Filled 2014-10-10: qty 2

## 2014-10-10 NOTE — ED Notes (Signed)
Pt on 2L [place].  

## 2014-10-10 NOTE — ED Notes (Signed)
Per [facility], pt from [facility], she had hx of hip fracture two weeks ago that was repaired. Pt has been complaining of pain every since for two weeks. facillity took xray and found femur fx. Pt denies falling since hip surgery. Pt in NAD upon arrival to ems, was given pain medication, hydrocodone 5mg  by facility. Pt is AAOX4.

## 2014-10-10 NOTE — ED Provider Notes (Signed)
CSN: [id number]     Arrival date & time 10/10/14  2157 History   First MD Initiated Contact with Patient 10/10/14 2203     Chief Complaint  Patient presents with  . Leg Pain   (Consider location/radiation/quality/duration/timing/severity/associated sxs/prior Treatment) HPI Comments: 81 yo F hx of chronic obstructive disease, chronic diastolic congestive heart failure, dyslipidemia, presents with R hip pain, concern for acute fx R hip.  Pt had recent fall in February, with R subcapital femur fracture.  She underwent bipolar hip arthroplasty on 2/20 with Dr. Alvan Dame.  Pt states since that time her R hip pain has not completely resolved, and states she has had worsening pain in the last few days.  Denies any new acute trauma, but states she has been doing PT/OT at her rehab facility, unable to do it today 2/2 pain.  Denies any other symptoms.  XR of R hip was ordered by facility, and demonstrated acute R hip fx.  Pt was sent to ED for further evaluation and treatment.  Of note pt's post-op course complicated by skin infection, for which she was treated with Cefixime.    The history is provided by the patient and medical records. No language interpreter was used.    Past Medical History  Diagnosis Date  . Hyperlipidemia   . Irregular heartbeat   . Arthritis   . Fibromyalgia   . CHF (congestive heart failure)   . Coronary artery disease   . Hiatal hernia   . COPD (chronic obstructive pulmonary disease)   . Osteoporosis   . Anxiety   . Depression   . Rotator cuff tear     RIGHT   Past Surgical History  Procedure Laterality Date  . Appendectomy    . Dilation and curettage of uterus      x2  . Elbow surgery      Right  . Cataract extraction      Left  . Hip arthroplasty Right 09/22/2014    Procedure: ARTHROPLASTY BIPOLAR HIP;  Surgeon: [name], MD;  Location: [facility];  Service: Orthopedics;  Laterality: Right;   Family History  Problem Relation Age of Onset  . Pancreatic cancer  Father   . Heart failure Mother   . Hypertension Mother   . Heart attack Brother   . Hypertension Brother   . Stroke Brother   . Heart attack Sister   . Diabetes Son   . Obesity Son   . Heart attack Son    History  Substance Use Topics  . Smoking status: Never Smoker   . Smokeless tobacco: Never Used  . Alcohol Use: No   OB History    Gravida Para Term Preterm AB TAB SAB Ectopic Multiple Living   1 1        1      Review of Systems  Constitutional: Negative for fever and chills.  Respiratory: Negative for cough and shortness of breath.   Cardiovascular: Negative for chest pain, palpitations and leg swelling.  Gastrointestinal: Negative for nausea, vomiting, diarrhea, constipation and abdominal distention.  Musculoskeletal: Positive for arthralgias. Negative for myalgias.  Skin: Negative for rash.  Neurological: Negative for dizziness, weakness, light-headedness, numbness and headaches.  Hematological: Negative for adenopathy. Does not bruise/bleed easily.  All other systems reviewed and are negative.     Allergies  Fish allergy  Home Medications   Prior to Admission medications   Medication Sig Start Date End Date Taking? Authorizing Provider  albuterol (PROVENTIL HFA;VENTOLIN HFA) 108 (90  BASE) MCG/ACT inhaler Inhale 2 puffs into the lungs every 6 (six) hours as needed. Patient taking differently: Inhale 2 puffs into the lungs every 6 (six) hours as needed for shortness of breath.  04/20/12  Yes Kathee Delton, MD  alum & mag hydroxide-simeth (MAALOX PLUS) 400-400-40 MG/5ML suspension Take 30 mLs by mouth every 6 (six) hours as needed for indigestion.   Yes Historical Provider, MD  Cholecalciferol (VITAMIN D3) 1000 UNITS CAPS Take 1 capsule by mouth daily.     Yes Historical Provider, MD  cyanocobalamin (,VITAMIN B-12,) 1000 MCG/ML injection Inject 1,000 mcg into the muscle every 30 (thirty) days.   Yes Historical Provider, MD  docusate sodium (COLACE) 100 MG capsule  Take 1 capsule (100 mg total) by mouth 2 (two) times daily. 09/25/14  Yes Kelvin Cellar, MD  HYDROcodone-acetaminophen (NORCO) 5-325 MG per tablet Take 1-2 tablets by mouth every 6 (six) hours as needed. Patient taking differently: Take 1-2 tablets by mouth every 6 (six) hours as needed for severe pain.  09/26/14  Yes Gildardo Cranker, DO  hydrocortisone (ANUSOL-HC) 25 MG suppository Place 25 mg rectally as needed for hemorrhoids.    Yes Historical Provider, MD  hydroxypropyl methylcellulose (ISOPTO TEARS) 2.5 % ophthalmic solution Place 1 drop into both eyes as needed for dry eyes.    Yes Historical Provider, MD  lansoprazole (PREVACID) 15 MG capsule Take 15 mg by mouth every morning.    Yes Historical Provider, MD  levothyroxine (SYNTHROID, LEVOTHROID) 75 MCG tablet Take 75 mcg by mouth daily.     Yes Historical Provider, MD  Multiple Vitamin (MULTIVITAMIN) capsule Take 1 capsule by mouth daily.     Yes Historical Provider, MD  nitroGLYCERIN (NITROSTAT) 0.4 MG SL tablet Place 0.4 mg under the tongue every 5 (five) minutes as needed for chest pain.    Yes Historical Provider, MD  OXYGEN Inhale 3 L into the lungs at bedtime.    Yes Historical Provider, MD  Polyethyl Glycol-Propyl Glycol (SYSTANE) 0.4-0.3 % SOLN Apply 1 drop to eye 3 (three) times daily.    Yes Historical Provider, MD  polyethylene glycol (MIRALAX / GLYCOLAX) packet Take 17 g by mouth daily as needed for mild constipation.    Yes Historical Provider, MD  tuberculin 5 UNIT/0.1ML injection Inject 5 Units into the skin once.   Yes Historical Provider, MD  aspirin EC 325 MG tablet Take 1 tablet (325 mg total) by mouth 2 (two) times daily. Take for 4 weeks 09/22/14   Paralee Cancel, MD  cefixime (SUPRAX) 400 MG tablet Take 400 mg by mouth every evening. Started 10/10/14, for 5 days ending 10/14/14    Historical Provider, MD  enoxaparin (LOVENOX) 40 MG/0.4ML injection Inject 0.4 mLs (40 mg total) into the skin daily. 09/24/14   Paralee Cancel, MD   HYDROcodone-acetaminophen (NORCO/VICODIN) 5-325 MG per tablet Take 1-2 tablets by mouth every 6 (six) hours as needed for moderate pain. 09/25/14   Kelvin Cellar, MD  rivaroxaban (XARELTO) 10 MG TABS tablet Take 10 mg by mouth daily.    Historical Provider, MD   BP 158/63 mmHg  Pulse 86  Temp(Src) 97.9 F (36.6 C) (Oral)  Resp 18  SpO2 96% Physical Exam  Constitutional: She is oriented to person, place, and time. She appears well-developed and well-nourished.  HENT:  Head: Normocephalic and atraumatic.  Right Ear: External ear normal.  Left Ear: External ear normal.  Mouth/Throat: Oropharynx is clear and moist.  Eyes: Conjunctivae and EOM are normal. Pupils are  equal, round, and reactive to light.  Neck: Normal range of motion. Neck supple.  Cardiovascular: Normal rate, regular rhythm, normal heart sounds and intact distal pulses.   Pulmonary/Chest: Effort normal and breath sounds normal. No respiratory distress. She has no wheezes. She has no rales. She exhibits no tenderness.  Abdominal: Soft. Bowel sounds are normal. She exhibits no distension and no mass. There is no tenderness. There is no rebound and no guarding.  Musculoskeletal: Normal range of motion.  Neurological: She is alert and oriented to person, place, and time.  Skin: Skin is warm and dry.  Surgical incision R hip clean, dry, intact, well healed, no erythema.    Nursing note and vitals reviewed.   ED Course  Procedures (including critical care time) Labs Review Labs Reviewed  BASIC METABOLIC PANEL - Abnormal; Notable for the following:    Glucose, Bld 136 (*)    GFR calc non Af Amer 75 (*)    GFR calc Af Amer 87 (*)    All other components within normal limits  CBC WITH DIFFERENTIAL/PLATELET - Abnormal; Notable for the following:    WBC 11.2 (*)    Neutrophils Relative % 81 (*)    Neutro Abs 9.2 (*)    Lymphocytes Relative 9 (*)    All other components within normal limits  PROTIME-INR - Abnormal;  Notable for the following:    Prothrombin Time 15.4 (*)    All other components within normal limits    Imaging Review Dg Chest 1 View  10/10/2014   CLINICAL DATA:  Right hip fracture.  EXAM: CHEST  1 VIEW  COMPARISON:  09/21/2014  FINDINGS: Normal heart size and pulmonary vascularity. Diffuse emphysematous changes in the chest with scattered fibrosis. No focal airspace disease or consolidation. No blunting of the costophrenic angles. No pneumothorax. Calcified aorta.  IMPRESSION: Emphysematous and chronic bronchitic changes in the lungs. No evidence of active consolidation.   Electronically Signed   By: Lucienne Capers M.D.   On: 10/10/2014 22:45   Dg Hip Unilat With Pelvis 2-3 Views Right  10/10/2014   CLINICAL DATA:  Previous hip fracture 2 weeks ago that was repaired. Patient is complaining of pain since then. Outside facility noted femoral fracture on x-ray.  EXAM: RIGHT HIP (WITH PELVIS) 2-3 VIEWS  COMPARISON:  09/21/2014  FINDINGS: Since the previous study, there is interval placement of bipolar left hip prosthesis with non cemented femoral components. There is an acute appearing fracture of the lesser trochanter extending to the bone hardware interface. The femoral prosthesis appears intact. No dislocation at the hip joint.  IMPRESSION: Right hip arthroplasty with acute fracture off of the lesser trochanter of the proximal right femur.   Electronically Signed   By: Lucienne Capers M.D.   On: 10/10/2014 22:43     EKG Interpretation   Date/Time:  Wednesday October 10 2014 22:15:14 EST Ventricular Rate:  88 PR Interval:  138 QRS Duration: 91 QT Interval:  361 QTC Calculation: 437 R Axis:   -19 Text Interpretation:  Sinus rhythm Borderline left axis deviation Probable  anteroseptal infarct, old Nonspecific T abnormalities, lateral leads  Baseline wander in lead(s) I aVR aVL V1 Sinus rhythm T wave abnormality  Artifact Abnormal ekg Confirmed by Carmin Muskrat  MD [phone]) on 10/10/2014   10:21:18 PM      MDM   Final diagnoses:  Hip fracture, right, closed, initial encounter   81 yo F hx of chronic obstructive disease, chronic diastolic congestive heart failure, dyslipidemia,  presents with R hip pain, concern for acute fx R hip.    Physical exam as above.  VS WNL.  Pt in pain and nauseated, and received Fentanyl IV and Zofran with some improvement.   XR today shows acute periprosthetic fx of R lesser trochanter.  I have discussed case with Dr. Tonita Cong from [facility].  Recommends admission, pain control, NPO status, and will evaluate in AM.   Medicine consulted for admission.  Pt understands and agrees with plan.   Dicussed pt with Dr. Vanita Panda.   Sinda Du       Sinda Du, MD 10/11/14 Greggory Keen  Carmin Muskrat, MD 10/12/14 [phone]

## 2014-10-11 ENCOUNTER — Encounter (HOSPITAL_COMMUNITY): Payer: Self-pay | Admitting: General Practice

## 2014-10-11 DIAGNOSIS — J449 Chronic obstructive pulmonary disease, unspecified: Secondary | ICD-10-CM | POA: Insufficient documentation

## 2014-10-11 DIAGNOSIS — T8484XD Pain due to internal orthopedic prosthetic devices, implants and grafts, subsequent encounter: Secondary | ICD-10-CM | POA: Diagnosis not present

## 2014-10-11 DIAGNOSIS — I2510 Atherosclerotic heart disease of native coronary artery without angina pectoris: Secondary | ICD-10-CM | POA: Diagnosis present

## 2014-10-11 DIAGNOSIS — E440 Moderate protein-calorie malnutrition: Secondary | ICD-10-CM | POA: Diagnosis not present

## 2014-10-11 DIAGNOSIS — J441 Chronic obstructive pulmonary disease with (acute) exacerbation: Secondary | ICD-10-CM | POA: Diagnosis not present

## 2014-10-11 DIAGNOSIS — N3000 Acute cystitis without hematuria: Secondary | ICD-10-CM

## 2014-10-11 DIAGNOSIS — R2689 Other abnormalities of gait and mobility: Secondary | ICD-10-CM | POA: Diagnosis not present

## 2014-10-11 DIAGNOSIS — T84048D Periprosthetic fracture around other internal prosthetic joint, subsequent encounter: Secondary | ICD-10-CM | POA: Diagnosis not present

## 2014-10-11 DIAGNOSIS — E43 Unspecified severe protein-calorie malnutrition: Secondary | ICD-10-CM | POA: Diagnosis not present

## 2014-10-11 DIAGNOSIS — Z79899 Other long term (current) drug therapy: Secondary | ICD-10-CM | POA: Diagnosis not present

## 2014-10-11 DIAGNOSIS — Z7951 Long term (current) use of inhaled steroids: Secondary | ICD-10-CM | POA: Diagnosis not present

## 2014-10-11 DIAGNOSIS — T84040A Periprosthetic fracture around internal prosthetic right hip joint, initial encounter: Secondary | ICD-10-CM | POA: Diagnosis not present

## 2014-10-11 DIAGNOSIS — F419 Anxiety disorder, unspecified: Secondary | ICD-10-CM | POA: Diagnosis present

## 2014-10-11 DIAGNOSIS — Z471 Aftercare following joint replacement surgery: Secondary | ICD-10-CM | POA: Diagnosis not present

## 2014-10-11 DIAGNOSIS — E039 Hypothyroidism, unspecified: Secondary | ICD-10-CM

## 2014-10-11 DIAGNOSIS — Y831 Surgical operation with implant of artificial internal device as the cause of abnormal reaction of the patient, or of later complication, without mention of misadventure at the time of the procedure: Secondary | ICD-10-CM | POA: Diagnosis present

## 2014-10-11 DIAGNOSIS — Z96641 Presence of right artificial hip joint: Secondary | ICD-10-CM | POA: Diagnosis not present

## 2014-10-11 DIAGNOSIS — I5032 Chronic diastolic (congestive) heart failure: Secondary | ICD-10-CM | POA: Diagnosis present

## 2014-10-11 DIAGNOSIS — D62 Acute posthemorrhagic anemia: Secondary | ICD-10-CM | POA: Diagnosis not present

## 2014-10-11 DIAGNOSIS — M25551 Pain in right hip: Secondary | ICD-10-CM | POA: Diagnosis present

## 2014-10-11 DIAGNOSIS — R531 Weakness: Secondary | ICD-10-CM | POA: Diagnosis not present

## 2014-10-11 DIAGNOSIS — R042 Hemoptysis: Secondary | ICD-10-CM | POA: Diagnosis not present

## 2014-10-11 DIAGNOSIS — K5909 Other constipation: Secondary | ICD-10-CM | POA: Diagnosis present

## 2014-10-11 DIAGNOSIS — N390 Urinary tract infection, site not specified: Secondary | ICD-10-CM | POA: Diagnosis not present

## 2014-10-11 DIAGNOSIS — S73004A Unspecified dislocation of right hip, initial encounter: Secondary | ICD-10-CM | POA: Diagnosis not present

## 2014-10-11 DIAGNOSIS — M797 Fibromyalgia: Secondary | ICD-10-CM | POA: Diagnosis present

## 2014-10-11 DIAGNOSIS — S72001A Fracture of unspecified part of neck of right femur, initial encounter for closed fracture: Secondary | ICD-10-CM

## 2014-10-11 DIAGNOSIS — E038 Other specified hypothyroidism: Secondary | ICD-10-CM | POA: Diagnosis not present

## 2014-10-11 DIAGNOSIS — T40605A Adverse effect of unspecified narcotics, initial encounter: Secondary | ICD-10-CM | POA: Diagnosis present

## 2014-10-11 DIAGNOSIS — K5900 Constipation, unspecified: Secondary | ICD-10-CM | POA: Diagnosis not present

## 2014-10-11 DIAGNOSIS — F329 Major depressive disorder, single episode, unspecified: Secondary | ICD-10-CM | POA: Diagnosis present

## 2014-10-11 DIAGNOSIS — E785 Hyperlipidemia, unspecified: Secondary | ICD-10-CM | POA: Diagnosis present

## 2014-10-11 DIAGNOSIS — M810 Age-related osteoporosis without current pathological fracture: Secondary | ICD-10-CM | POA: Diagnosis present

## 2014-10-11 LAB — CBC
HCT: 36.4 % (ref 36.0–46.0)
Hemoglobin: 11.6 g/dL — ABNORMAL LOW (ref 12.0–15.0)
MCH: 31.4 pg (ref 26.0–34.0)
MCHC: 31.9 g/dL (ref 30.0–36.0)
MCV: 98.4 fL (ref 78.0–100.0)
PLATELETS: 251 10*3/uL (ref 150–400)
RBC: 3.7 MIL/uL — ABNORMAL LOW (ref 3.87–5.11)
RDW: 14.9 % (ref 11.5–15.5)
WBC: 8.5 10*3/uL (ref 4.0–10.5)

## 2014-10-11 LAB — BASIC METABOLIC PANEL
ANION GAP: 4 — AB (ref 5–15)
BUN: 10 mg/dL (ref 6–23)
CALCIUM: 8.5 mg/dL (ref 8.4–10.5)
CO2: 35 mmol/L — ABNORMAL HIGH (ref 19–32)
Chloride: 97 mmol/L (ref 96–112)
Creatinine, Ser: 0.65 mg/dL (ref 0.50–1.10)
GFR calc Af Amer: >90 mL/min (ref >90)
GFR calc non Af Amer: 79 mL/min — ABNORMAL LOW (ref >90)
Glucose, Bld: 103 mg/dL — ABNORMAL HIGH (ref 70–99)
Potassium: 3.9 mmol/L (ref 3.5–5.1)
SODIUM: 136 mmol/L (ref 135–145)

## 2014-10-11 MED ORDER — FENTANYL CITRATE 0.05 MG/ML IJ SOLN
25.0000 ug | Freq: Once | INTRAMUSCULAR | Status: AC
  Administered 2014-10-11: 25 ug via INTRAVENOUS
  Filled 2014-10-11: qty 2

## 2014-10-11 MED ORDER — ONDANSETRON HCL 4 MG/2ML IJ SOLN
4.0000 mg | Freq: Once | INTRAMUSCULAR | Status: AC
  Administered 2014-10-11: 4 mg via INTRAVENOUS
  Filled 2014-10-11: qty 2

## 2014-10-11 MED ORDER — IPRATROPIUM-ALBUTEROL 0.5-2.5 (3) MG/3ML IN SOLN
3.0000 mL | RESPIRATORY_TRACT | Status: DC | PRN
  Administered 2014-10-12: 3 mL via RESPIRATORY_TRACT
  Filled 2014-10-11: qty 3

## 2014-10-11 MED ORDER — ONDANSETRON HCL 4 MG PO TABS: 4.0000 mg | ORAL_TABLET | Freq: Four times a day (QID) | ORAL | Status: DC | PRN

## 2014-10-11 MED ORDER — ONDANSETRON HCL 4 MG/2ML IJ SOLN
4.0000 mg | Freq: Four times a day (QID) | INTRAMUSCULAR | Status: DC | PRN
  Administered 2014-10-16: 4 mg via INTRAVENOUS
  Filled 2014-10-11: qty 2

## 2014-10-11 MED ORDER — CEFTRIAXONE SODIUM IN DEXTROSE 20 MG/ML IV SOLN
1.0000 g | Freq: Every day | INTRAVENOUS | Status: DC
  Administered 2014-10-11 – 2014-10-15 (×5): 1 g via INTRAVENOUS
  Filled 2014-10-11 (×5): qty 50

## 2014-10-11 MED ORDER — SODIUM CHLORIDE 0.9 % IV SOLN
INTRAVENOUS | Status: DC
  Administered 2014-10-11 – 2014-10-13 (×4): via INTRAVENOUS

## 2014-10-11 MED ORDER — ASPIRIN EC 325 MG PO TBEC
325.0000 mg | DELAYED_RELEASE_TABLET | Freq: Two times a day (BID) | ORAL | Status: DC
  Administered 2014-10-11 (×2): 325 mg via ORAL
  Filled 2014-10-11 (×3): qty 1

## 2014-10-11 MED ORDER — LEVOTHYROXINE SODIUM 75 MCG PO TABS
75.0000 ug | ORAL_TABLET | Freq: Every day | ORAL | Status: DC
  Administered 2014-10-11 – 2014-10-12 (×2): 75 ug via ORAL
  Filled 2014-10-11 (×2): qty 1

## 2014-10-11 MED ORDER — HYDROMORPHONE HCL 1 MG/ML IJ SOLN
0.5000 mg | INTRAMUSCULAR | Status: DC | PRN
  Administered 2014-10-11 – 2014-10-13 (×9): 1 mg via INTRAVENOUS
  Administered 2014-10-13: 0.5 mg via INTRAVENOUS
  Filled 2014-10-11 (×10): qty 1

## 2014-10-11 MED ORDER — SENNOSIDES-DOCUSATE SODIUM 8.6-50 MG PO TABS
1.0000 | ORAL_TABLET | Freq: Every day | ORAL | Status: DC
  Administered 2014-10-11 – 2014-10-16 (×6): 1 via ORAL
  Filled 2014-10-11 (×7): qty 1

## 2014-10-11 MED ORDER — ACETAMINOPHEN 650 MG RE SUPP: 650.0000 mg | Freq: Four times a day (QID) | RECTAL | Status: DC | PRN

## 2014-10-11 MED ORDER — SODIUM CHLORIDE 0.9 % IV SOLN
Freq: Once | INTRAVENOUS | Status: AC
  Administered 2014-10-11: 01:00:00 via INTRAVENOUS

## 2014-10-11 MED ORDER — ACETAMINOPHEN 325 MG PO TABS: 650.0000 mg | ORAL_TABLET | Freq: Four times a day (QID) | ORAL | Status: DC | PRN

## 2014-10-11 MED ORDER — ENSURE COMPLETE PO LIQD
237.0000 mL | Freq: Two times a day (BID) | ORAL | Status: DC
  Administered 2014-10-14 – 2014-10-17 (×6): 237 mL via ORAL

## 2014-10-11 NOTE — ED Notes (Signed)
Dr. Webb at bedside

## 2014-10-11 NOTE — Consult Note (Signed)
Reason for Consult: R periprosthetic hip fx Referring Physician: Dr. Justin Mend (ER)  [name] is an 81 y.o. female.  HPI: S/p R hip hemiarthroplasty by Dr. Alvan Dame on 09/22/14. Was D/C'd to rehab center post-op. She saw Dr. Alvan Dame last week and had xrays which did not demonstrate any fx, on ASA for DVT ppx since then. Reports pain since surgery, increased last few days especially with PT exercises, returned to [facility], xrays with periprosthetic fx, Dr. Tonita Cong consulted, Dr. Alvan Dame is currently out of town. Admitted to medical service for definitive mgmt. Also recently dx with UTI. Has hx of fibromyalgia reports widespread LE pain. Reporting some abdominal cramping and feeling constipated due to pain meds.  Past Medical History  Diagnosis Date  . Hyperlipidemia   . Irregular heartbeat   . Arthritis   . Fibromyalgia   . CHF (congestive heart failure)   . Coronary artery disease   . Hiatal hernia   . COPD (chronic obstructive pulmonary disease)   . Osteoporosis   . Anxiety   . Depression   . Rotator cuff tear     RIGHT  . Complication of anesthesia     " I have to have a spinal beacuse my lungs & heart are bad "  . UTI (urinary tract infection) 09/2014    Past Surgical History  Procedure Laterality Date  . Appendectomy    . Dilation and curettage of uterus      x2  . Elbow surgery      Right  . Cataract extraction      Left  . Hip arthroplasty Right 09/22/2014    Procedure: ARTHROPLASTY BIPOLAR HIP;  Surgeon: [name], MD;  Location: [facility];  Service: Orthopedics;  Laterality: Right;  . Tonsillectomy      Family History  Problem Relation Age of Onset  . Pancreatic cancer Father   . Heart failure Mother   . Hypertension Mother   . Heart attack Brother   . Hypertension Brother   . Stroke Brother   . Heart attack Sister   . Diabetes Son   . Obesity Son   . Heart attack Son     Social History:  reports that she has never smoked. She has never used smokeless tobacco. She reports  that she does not drink alcohol or use illicit drugs.  Allergies:  Allergies  Allergen Reactions  . Fish Allergy Nausea And Vomiting    Extremely sick    Medications: I have reviewed the patient's current medications.  Results for orders placed or performed during the hospital encounter of 10/10/14 (from the past 48 hour(s))  Basic metabolic panel     Status: Abnormal   Collection Time: 10/10/14 10:11 PM  Result Value Ref Range   Sodium 135 135 - 145 mmol/L   Potassium 4.6 3.5 - 5.1 mmol/L   Chloride 100 96 - 112 mmol/L   CO2 27 19 - 32 mmol/L   Glucose, Bld 136 (H) 70 - 99 mg/dL   BUN 14 6 - 23 mg/dL   Creatinine, Ser 0.76 0.50 - 1.10 mg/dL   Calcium 8.7 8.4 - 10.5 mg/dL   GFR calc non Af Amer 75 (L) >90 mL/min   GFR calc Af Amer 87 (L) >90 mL/min    Comment: (NOTE) The eGFR has been calculated using the CKD EPI equation. This calculation has not been validated in all clinical situations. eGFR's persistently <90 mL/min signify possible Chronic Kidney Disease.    Anion gap 8  5 - 15  CBC with Differential     Status: Abnormal   Collection Time: 10/10/14 10:11 PM  Result Value Ref Range   WBC 11.2 (H) 4.0 - 10.5 K/uL   RBC 3.99 3.87 - 5.11 MIL/uL   Hemoglobin 12.2 12.0 - 15.0 g/dL   HCT 38.1 36.0 - 46.0 %   MCV 95.5 78.0 - 100.0 fL   MCH 30.6 26.0 - 34.0 pg   MCHC 32.0 30.0 - 36.0 g/dL   RDW 14.5 11.5 - 15.5 %   Platelets 259 150 - 400 K/uL   Neutrophils Relative % 81 (H) 43 - 77 %   Neutro Abs 9.2 (H) 1.7 - 7.7 K/uL   Lymphocytes Relative 9 (L) 12 - 46 %   Lymphs Abs 1.0 0.7 - 4.0 K/uL   Monocytes Relative 8 3 - 12 %   Monocytes Absolute 0.9 0.1 - 1.0 K/uL   Eosinophils Relative 2 0 - 5 %   Eosinophils Absolute 0.2 0.0 - 0.7 K/uL   Basophils Relative 0 0 - 1 %   Basophils Absolute 0.0 0.0 - 0.1 K/uL  Protime-INR     Status: Abnormal   Collection Time: 10/10/14 10:11 PM  Result Value Ref Range   Prothrombin Time 15.4 (H) 11.6 - 15.2 seconds   INR 1.20 0.00 -  1.50  Basic metabolic panel     Status: Abnormal   Collection Time: 10/11/14  7:25 AM  Result Value Ref Range   Sodium 136 135 - 145 mmol/L   Potassium 3.9 3.5 - 5.1 mmol/L   Chloride 97 96 - 112 mmol/L   CO2 35 (H) 19 - 32 mmol/L   Glucose, Bld 103 (H) 70 - 99 mg/dL   BUN 10 6 - 23 mg/dL   Creatinine, Ser 0.65 0.50 - 1.10 mg/dL   Calcium 8.5 8.4 - 10.5 mg/dL   GFR calc non Af Amer 79 (L) >90 mL/min   GFR calc Af Amer >90 >90 mL/min    Comment: (NOTE) The eGFR has been calculated using the CKD EPI equation. This calculation has not been validated in all clinical situations. eGFR's persistently <90 mL/min signify possible Chronic Kidney Disease.    Anion gap 4 (L) 5 - 15  CBC     Status: Abnormal   Collection Time: 10/11/14  7:25 AM  Result Value Ref Range   WBC 8.5 4.0 - 10.5 K/uL   RBC 3.70 (L) 3.87 - 5.11 MIL/uL   Hemoglobin 11.6 (L) 12.0 - 15.0 g/dL   HCT 36.4 36.0 - 46.0 %   MCV 98.4 78.0 - 100.0 fL   MCH 31.4 26.0 - 34.0 pg   MCHC 31.9 30.0 - 36.0 g/dL   RDW 14.9 11.5 - 15.5 %   Platelets 251 150 - 400 K/uL    Dg Chest 1 View  10/10/2014   CLINICAL DATA:  Right hip fracture.  EXAM: CHEST  1 VIEW  COMPARISON:  09/21/2014  FINDINGS: Normal heart size and pulmonary vascularity. Diffuse emphysematous changes in the chest with scattered fibrosis. No focal airspace disease or consolidation. No blunting of the costophrenic angles. No pneumothorax. Calcified aorta.  IMPRESSION: Emphysematous and chronic bronchitic changes in the lungs. No evidence of active consolidation.   Electronically Signed   By: Lucienne Capers M.D.   On: 10/10/2014 22:45   Dg Hip Unilat With Pelvis 2-3 Views Right  10/10/2014   CLINICAL DATA:  Previous hip fracture 2 weeks ago that was repaired. Patient is complaining  of pain since then. Outside facility noted femoral fracture on x-ray.  EXAM: RIGHT HIP (WITH PELVIS) 2-3 VIEWS  COMPARISON:  09/21/2014  FINDINGS: Since the previous study, there is interval  placement of bipolar left hip prosthesis with non cemented femoral components. There is an acute appearing fracture of the lesser trochanter extending to the bone hardware interface. The femoral prosthesis appears intact. No dislocation at the hip joint.  IMPRESSION: Right hip arthroplasty with acute fracture off of the lesser trochanter of the proximal right femur.   Electronically Signed   By: Lucienne Capers M.D.   On: 10/10/2014 22:43    Review of Systems  Constitutional: Negative.   HENT: Negative.   Eyes: Negative.   Cardiovascular: Negative.   Gastrointestinal: Negative.   Genitourinary: Negative.   Musculoskeletal: Positive for joint pain and falls.  Skin: Negative.   Neurological: Negative.    Blood pressure 98/71, pulse 75, temperature 98.6 F (37 C), temperature source Oral, resp. rate 16, SpO2 96 %. Physical Exam  Constitutional: She is oriented to person, place, and time. She appears well-developed and well-nourished.  HENT:  Head: Normocephalic and atraumatic.  Eyes: Conjunctivae are normal. Pupils are equal, round, and reactive to light.  Neck: Normal range of motion. Neck supple.  Cardiovascular: Normal rate and regular rhythm.   Respiratory: Effort normal.  GI: Soft.  Musculoskeletal:  R hip with localized swelling, hematoma at posterolateral hip. Prior incision healing well, still some scabbing noted mid-incision. No drainage of sign of infx. Pain with attempted motion R Leg No sign of DVT DP/PT pulses 2+ Knee, ankle nontender   Neurological: She is alert and oriented to person, place, and time. She has normal reflexes.  Skin: Skin is warm and dry.  Psychiatric: She has a normal mood and affect.   xrays reviewed with R peri-prosthetic lesser trochanter fx, displaced, prior hemiarthroplasty with no subluxation or dislocation. No other fx noted.  Assessment/Plan: R proximal femur periprosthetic fx  Plan surgery by Dr. Lyla Glassing- possible exchange of femoral  prosthesis, ORIF/cabling of lesser troch fragment. Discussed possibilities with the patient and her husband in detail Will keep NPO for possible OR tomorrow Pain control Discussed constipation due to narcotics- Senna ordered, warm kpad added.  Tx for UTI per admitting service- Rocephin Will discuss with Dr. Nash Shearer, Conley Rolls. PA-C  10/11/2014, 5:41 PM

## 2014-10-11 NOTE — ED Notes (Signed)
Pt requesting pain medication and nausea medication

## 2014-10-11 NOTE — Progress Notes (Signed)
Patient ID: [name], female   DOB: Jul 09, 1927, 81 y.o.   MRN: [id number]   Have asked Dr. Lyla Glassing to assume Orthopedic care of patient. Surgical procedure will not be today. Await his further input.  Dr. Tonita Cong [phone]

## 2014-10-11 NOTE — H&P (Signed)
[facility] Admission History and Physical       [name] ZSW:[id number] DOB: 1926/08/26 DOA: 10/10/2014  Referring physician: EDP PCP: [name], MD  Specialists:   Chief Complaint: Right Hip Pain  HPI: [name] is a 81 y.o. female with a recent Right THA on 09/22/2014 who was discharged to the  [facility] but she continued to have increased pain and was unable to bear weight on her rt leg and unable to participate with her physical therapy.  At the facility an X-ray of the Right hip was performed, which revealed extension of the fracture above the hardware.    She denies any falls or trauma.    [facility] was consulted and will se her in the AM.  Review of Systems:  Constitutional: No Weight Loss, No Weight Gain, Night Sweats, Fevers, Chills, Dizziness, Light Headedness, Fatigue, or Generalized Weakness HEENT: No Headaches, Difficulty Swallowing,Tooth/Dental Problems,Sore Throat,  No Sneezing, Rhinitis, Ear Ache, Nasal Congestion, or Post Nasal Drip,  Cardio-vascular:  No Chest pain, Orthopnea, PND, Edema in Lower Extremities, Anasarca, Dizziness, Palpitations  Resp: No Dyspnea, No DOE, No Productive Cough, No Non-Productive Cough, No Hemoptysis, No Wheezing.    GI: No Heartburn, Indigestion, Abdominal Pain, Nausea, Vomiting, Diarrhea, Constipation, Hematemesis, Hematochezia, Melena, Change in Bowel Habits,  Loss of Appetite  GU: No Dysuria, No Change in Color of Urine, No Urgency or Urinary Frequency, No Flank pain.  Musculoskeletal: +Right Hip Pain,  +RT Hip Decreased Range of Motion, No Back Pain.  Neurologic: No Syncope, No Seizures, Muscle Weakness, Paresthesia, Vision Disturbance or Loss, No Diplopia, No Vertigo, No Difficulty Walking,  Skin: No Rash or Lesions. Psych: No Change in Mood or Affect, No Depression or Anxiety, No Memory loss, No Confusion, or Hallucinations   Past Medical History  Diagnosis Date  .  Hyperlipidemia   . Irregular heartbeat   . Arthritis   . Fibromyalgia   . CHF (congestive heart failure)   . Coronary artery disease   . Hiatal hernia   . COPD (chronic obstructive pulmonary disease)   . Osteoporosis   . Anxiety   . Depression   . Rotator cuff tear     RIGHT     Past Surgical History  Procedure Laterality Date  . Appendectomy    . Dilation and curettage of uterus      x2  . Elbow surgery      Right  . Cataract extraction      Left  . Hip arthroplasty Right 09/22/2014    Procedure: ARTHROPLASTY BIPOLAR HIP;  Surgeon: [name], MD;  Location: [place];  Service: Orthopedics;  Laterality: Right;      Prior to Admission medications   Medication Sig Start Date End Date Taking? Authorizing Provider  albuterol (PROVENTIL HFA;VENTOLIN HFA) 108 (90 BASE) MCG/ACT inhaler Inhale 2 puffs into the lungs every 6 (six) hours as needed. Patient taking differently: Inhale 2 puffs into the lungs every 6 (six) hours as needed for shortness of breath.  04/20/12  Yes Kathee Delton, MD  alum & mag hydroxide-simeth (MAALOX PLUS) 400-400-40 MG/5ML suspension Take 30 mLs by mouth every 6 (six) hours as needed for indigestion.   Yes Historical Provider, MD  Cholecalciferol (VITAMIN D3) 1000 UNITS CAPS Take 1 capsule by mouth daily.     Yes Historical Provider, MD  cyanocobalamin (,VITAMIN B-12,) 1000 MCG/ML injection Inject 1,000 mcg into the muscle every 30 (thirty) days.   Yes Historical Provider,  MD  docusate sodium (COLACE) 100 MG capsule Take 1 capsule (100 mg total) by mouth 2 (two) times daily. 09/25/14  Yes Kelvin Cellar, MD  HYDROcodone-acetaminophen (NORCO) 5-325 MG per tablet Take 1-2 tablets by mouth every 6 (six) hours as needed. Patient taking differently: Take 1-2 tablets by mouth every 6 (six) hours as needed for severe pain.  09/26/14  Yes Gildardo Cranker, DO  hydrocortisone (ANUSOL-HC) 25 MG suppository Place 25 mg rectally as needed for hemorrhoids.    Yes Historical  Provider, MD  hydroxypropyl methylcellulose (ISOPTO TEARS) 2.5 % ophthalmic solution Place 1 drop into both eyes as needed for dry eyes.    Yes Historical Provider, MD  lansoprazole (PREVACID) 15 MG capsule Take 15 mg by mouth every morning.    Yes Historical Provider, MD  levothyroxine (SYNTHROID, LEVOTHROID) 75 MCG tablet Take 75 mcg by mouth daily.     Yes Historical Provider, MD  Multiple Vitamin (MULTIVITAMIN) capsule Take 1 capsule by mouth daily.     Yes Historical Provider, MD  nitroGLYCERIN (NITROSTAT) 0.4 MG SL tablet Place 0.4 mg under the tongue every 5 (five) minutes as needed for chest pain.    Yes Historical Provider, MD  OXYGEN Inhale 3 L into the lungs at bedtime.    Yes Historical Provider, MD  Polyethyl Glycol-Propyl Glycol (SYSTANE) 0.4-0.3 % SOLN Apply 1 drop to eye 3 (three) times daily.    Yes Historical Provider, MD  polyethylene glycol (MIRALAX / GLYCOLAX) packet Take 17 g by mouth daily as needed for mild constipation.    Yes Historical Provider, MD  tuberculin 5 UNIT/0.1ML injection Inject 5 Units into the skin once.   Yes Historical Provider, MD  aspirin EC 325 MG tablet Take 1 tablet (325 mg total) by mouth 2 (two) times daily. Take for 4 weeks 09/22/14   Paralee Cancel, MD  cefixime (SUPRAX) 400 MG tablet Take 400 mg by mouth every evening. Started 10/10/14, for 5 days ending 10/14/14    Historical Provider, MD  enoxaparin (LOVENOX) 40 MG/0.4ML injection Inject 0.4 mLs (40 mg total) into the skin daily. 09/24/14   Paralee Cancel, MD  HYDROcodone-acetaminophen (NORCO/VICODIN) 5-325 MG per tablet Take 1-2 tablets by mouth every 6 (six) hours as needed for moderate pain. 09/25/14   Kelvin Cellar, MD  rivaroxaban (XARELTO) 10 MG TABS tablet Take 10 mg by mouth daily.    Historical Provider, MD     Allergies  Allergen Reactions  . Fish Allergy Nausea And Vomiting    Extremely sick    Social History:  reports that she has never smoked. She has never used smokeless tobacco. She  reports that she does not drink alcohol. Her drug history is not on file.    Family History  Problem Relation Age of Onset  . Pancreatic cancer Father   . Heart failure Mother   . Hypertension Mother   . Heart attack Brother   . Hypertension Brother   . Stroke Brother   . Heart attack Sister   . Diabetes Son   . Obesity Son   . Heart attack Son       Physical Exam:  GEN:  Pleasant Thin Elderly  81 y.o. Caucasian  female examined and in no acute distress; cooperative with exam Filed Vitals:   10/11/14 0052 10/11/14 0100 10/11/14 0115 10/11/14 0130  BP: 139/56 131/50 143/53 142/50  Pulse: 76 72 79 74  Temp:      TempSrc:      Resp: 19 15  16 14  SpO2: 100% 100% 100% 100%   Blood pressure 142/50, pulse 74, temperature 97.9 F (36.6 C), temperature source Oral, resp. rate 14, SpO2 100 %. PSYCH: She is alert and oriented x4; does not appear anxious does not appear depressed; affect is normal HEENT: Normocephalic and Atraumatic, Mucous membranes pink; PERRLA; EOM intact; Fundi:  Benign;  No scleral icterus, Nares: Patent, Oropharynx: Clear, Fair Dentition,    Neck:  FROM, No Cervical Lymphadenopathy nor Thyromegaly or Carotid Bruit; No JVD; Breasts:: Not examined CHEST WALL: No tenderness CHEST: Normal respiration, clear to auscultation bilaterally HEART: Regular rate and rhythm; no murmurs rubs or gallops BACK: No kyphosis or scoliosis; No CVA tenderness ABDOMEN: Positive Bowel Sounds, Scaphoid, Soft Non-Tender, No Rebound or Guarding; No Masses, No Organomegaly Rectal Exam: Not done EXTREMITIES: No Cyanosis, Clubbing, or Edema; No Ulcerations. Genitalia: not examined PULSES: 2+ and symmetric SKIN: Normal hydration no rash or ulceration CNS:  Alert and Oriented x 4, No Focal Deficits Vascular: pulses palpable throughout    Labs on Admission:  Basic Metabolic Panel:  Recent Labs Lab 10/10/14 2211  NA 135  K 4.6  CL 100  CO2 27  GLUCOSE 136*  BUN 14  CREATININE  0.76  CALCIUM 8.7   Liver Function Tests: No results for input(s): AST, ALT, ALKPHOS, BILITOT, PROT, ALBUMIN in the last 168 hours. No results for input(s): LIPASE, AMYLASE in the last 168 hours. No results for input(s): AMMONIA in the last 168 hours. CBC:  Recent Labs Lab 10/10/14 2211  WBC 11.2*  NEUTROABS 9.2*  HGB 12.2  HCT 38.1  MCV 95.5  PLT 259   Cardiac Enzymes: No results for input(s): CKTOTAL, CKMB, CKMBINDEX, TROPONINI in the last 168 hours.  BNP (last 3 results) No results for input(s): BNP in the last 8760 hours.  ProBNP (last 3 results) No results for input(s): PROBNP in the last 8760 hours.  CBG: No results for input(s): GLUCAP in the last 168 hours.  Radiological Exams on Admission: Dg Chest 1 View  10/10/2014   CLINICAL DATA:  Right hip fracture.  EXAM: CHEST  1 VIEW  COMPARISON:  09/21/2014  FINDINGS: Normal heart size and pulmonary vascularity. Diffuse emphysematous changes in the chest with scattered fibrosis. No focal airspace disease or consolidation. No blunting of the costophrenic angles. No pneumothorax. Calcified aorta.  IMPRESSION: Emphysematous and chronic bronchitic changes in the lungs. No evidence of active consolidation.   Electronically Signed   By: Lucienne Capers M.D.   On: 10/10/2014 22:45   Dg Hip Unilat With Pelvis 2-3 Views Right  10/10/2014   CLINICAL DATA:  Previous hip fracture 2 weeks ago that was repaired. Patient is complaining of pain since then. Outside facility noted femoral fracture on x-ray.  EXAM: RIGHT HIP (WITH PELVIS) 2-3 VIEWS  COMPARISON:  09/21/2014  FINDINGS: Since the previous study, there is interval placement of bipolar left hip prosthesis with non cemented femoral components. There is an acute appearing fracture of the lesser trochanter extending to the bone hardware interface. The femoral prosthesis appears intact. No dislocation at the hip joint.  IMPRESSION: Right hip arthroplasty with acute fracture off of the lesser  trochanter of the proximal right femur.   Electronically Signed   By: Lucienne Capers M.D.   On: 10/10/2014 22:43     EKG: Independently reviewed.    Assessment/Plan:  81 y.o. female with  Active Problems:   1.    Hip fracture   Orthopedics Consulted to see this  AM   IV Dilaudid for PAIN     2.    UTI-Treatment was to start 03/09 but was sent to the [facility] Urine C+S,   IV Rocephin     3.    COPD   Duonebs PRN    PRN O2     4.    Hypothyroid   Contiue Levothyroxine rx       5.   DVT Prophylaxis    Continue on Xarelto Rx             Code Status:     Limited Code: NO CPR Family Communication:   Family at Bedside   Disposition Plan:    Inpatient Status       Time spent:  [place] [facility] Pager [phone]   If [place] Please Contact the Day Rounding Team MD for [facility]  If 7PM-7AM, Please Contact Night-Floor Coverage  [email or web address] Password [email or web address] 10/11/2014, 2:00 AM     ADDENDUM:   Patient was seen and examined on 10/11/2014

## 2014-10-11 NOTE — Progress Notes (Signed)
Patient seen and examined this morning, agree with H&P.   She is doing well this morning, however complains of increased spasms. Also complains of dysuria and tells me taht she was diagnosed recently with a UTI and is currently taking antibiotics.   On exam she is in mild distress with movement but comfortable at rest CV: RRR without MRG, good pulses, no edema Pulm; CTA biL, no wheezing Abdomen: soft, non tender   Acute fracture of the lesser trochanter of the proximal right femur  - orthopedic surgery consulted  UTI - on Rocephin, cultures pending. Likely cultures will be negative since she has already been on antibiotics.   COPD - stable  Hypothyroidism - continue synthroid   Evie Croston M. Cruzita Lederer, MD [facility] [phone]

## 2014-10-12 ENCOUNTER — Encounter (HOSPITAL_COMMUNITY): Payer: Self-pay | Admitting: Anesthesiology

## 2014-10-12 DIAGNOSIS — E43 Unspecified severe protein-calorie malnutrition: Secondary | ICD-10-CM | POA: Insufficient documentation

## 2014-10-12 DIAGNOSIS — M9701XA Periprosthetic fracture around internal prosthetic right hip joint, initial encounter: Secondary | ICD-10-CM

## 2014-10-12 DIAGNOSIS — R042 Hemoptysis: Secondary | ICD-10-CM

## 2014-10-12 LAB — BASIC METABOLIC PANEL
ANION GAP: 7 (ref 5–15)
BUN: 8 mg/dL (ref 6–23)
CO2: 30 mmol/L (ref 19–32)
CREATININE: 0.69 mg/dL (ref 0.50–1.10)
Calcium: 8.3 mg/dL — ABNORMAL LOW (ref 8.4–10.5)
Chloride: 101 mmol/L (ref 96–112)
GFR calc Af Amer: 89 mL/min — ABNORMAL LOW (ref >90)
GFR calc non Af Amer: 77 mL/min — ABNORMAL LOW (ref >90)
Glucose, Bld: 104 mg/dL — ABNORMAL HIGH (ref 70–99)
Potassium: 4.1 mmol/L (ref 3.5–5.1)
Sodium: 138 mmol/L (ref 135–145)

## 2014-10-12 LAB — CBC
HCT: 34 % — ABNORMAL LOW (ref 36.0–46.0)
HEMOGLOBIN: 11 g/dL — AB (ref 12.0–15.0)
MCH: 31.3 pg (ref 26.0–34.0)
MCHC: 32.4 g/dL (ref 30.0–36.0)
MCV: 96.6 fL (ref 78.0–100.0)
Platelets: 223 10*3/uL (ref 150–400)
RBC: 3.52 MIL/uL — ABNORMAL LOW (ref 3.87–5.11)
RDW: 14.6 % (ref 11.5–15.5)
WBC: 7.5 10*3/uL (ref 4.0–10.5)

## 2014-10-12 LAB — GLUCOSE, CAPILLARY: Glucose-Capillary: 94 mg/dL (ref 70–99)

## 2014-10-12 MED ORDER — HYDROCOD POLST-CHLORPHEN POLST 10-8 MG/5ML PO LQCR
5.0000 mL | Freq: Once | ORAL | Status: DC
  Filled 2014-10-12 (×2): qty 5

## 2014-10-12 MED ORDER — PROPOFOL 10 MG/ML IV BOLUS
INTRAVENOUS | Status: AC
  Filled 2014-10-12: qty 20

## 2014-10-12 MED ORDER — CLONAZEPAM 0.5 MG PO TABS
0.2500 mg | ORAL_TABLET | Freq: Three times a day (TID) | ORAL | Status: DC | PRN
  Administered 2014-10-12: 0.5 mg via ORAL
  Administered 2014-10-13 – 2014-10-14 (×3): 0.25 mg via ORAL
  Filled 2014-10-12 (×5): qty 1

## 2014-10-12 MED ORDER — ROCURONIUM BROMIDE 50 MG/5ML IV SOLN
INTRAVENOUS | Status: AC
  Filled 2014-10-12: qty 1

## 2014-10-12 MED ORDER — FENTANYL CITRATE 0.05 MG/ML IJ SOLN
INTRAMUSCULAR | Status: AC
  Filled 2014-10-12: qty 5

## 2014-10-12 MED ORDER — LIDOCAINE HCL (CARDIAC) 20 MG/ML IV SOLN
INTRAVENOUS | Status: AC
  Filled 2014-10-12: qty 5

## 2014-10-12 MED ORDER — LACTATED RINGERS IV SOLN
INTRAVENOUS | Status: DC
  Administered 2014-10-12: 15:00:00 via INTRAVENOUS

## 2014-10-12 NOTE — Progress Notes (Signed)
Asked to assume care of the patient by Dr. Tonita Cong. For further details, please see Kennyth Lose Bissell's note 10/11/14.  Has R periprosthetic femur fracture s/p hemiarthroplasty for femoral neck fx 2 weeks ago  -Plan for OR today: revision of right hip femoral component, possible conversion to THA -The risks, benefits, and alternatives were discussed with the patient. There are risks associated with the surgery including, but not limited to, problems with anesthesia (death), infection, dislocation, infection, instability (giving out of the joint), dislocation, differences in leg length/angulation/rotation, fracture of bones, loosening or failure of implants, hematoma (blood accumulation) which may require surgical drainage, blood clots, pulmonary embolism, nerve injury (foot drop), and blood vessel injury. The patient understands these risks and elects to proceed.

## 2014-10-12 NOTE — Progress Notes (Signed)
INITIAL NUTRITION ASSESSMENT  Pt meets criteria for SEVERE MALNUTRITION in the context of chronic illness as evidenced by severe fat and muscle mass loss.  DOCUMENTATION CODES Per approved criteria  -Severe malnutrition in the context of chronic illness   INTERVENTION: Once diet advances, continue Ensure Enlive po BID, each supplement provides 350 kcal and 20 grams of protein.  Recommend obtaining new weight to fully assess weight trends.  NUTRITION DIAGNOSIS: Increased nutrient needs related to s/p surgery as evidenced by estimated nutrition needs.   Goal: Pt to meet >/= 90% of their estimated nutrition needs   Monitor:  Diet advancement, weight trends, labs, I/O's  Reason for Assessment: MST  81 y.o. female  Admitting Dx: Hip fx  ASSESSMENT: Pt with recent Right THA on 09/22/2014 who was discharged to the [facility] but she continued to have increased pain and was unable to bear weight on her rt leg and unable to participate with her physical therapy.X-ray of the Right hip was performed revealed extension of the fracture above the hardware.  Pt is currently NPO for total hip revision today. Pt reports having a decreased appetite since admission. Husband reports PTA pt was eating fine with 3 meals a day along with Ensure 1-2 times daily. Pt reports usual body weight of 138 lbs. Weight has been stable. Current meal completion is 50-75%. Pt currently has Ensure ordered. RD to continue with current orders. Pt was encouraged to eat her food at meals and to continue her supplements.  Nutrition Focused Physical Exam:  Subcutaneous Fat:  Orbital Region: N/A Upper Arm Region: Severe depletion Thoracic and Lumbar Region: WNL  Muscle:  Temple Region: N/A Clavicle Bone Region: Severe depletion Clavicle and Acromion Bone Region: Severe depletion Scapular Bone Region: N/A Dorsal Hand: N/A Patellar Region: Moderate depletion Anterior Thigh Region: Moderate  depletion Posterior Calf Region: N/A  Edema: non-pitting RLE  Labs: Low calcium and GFR.  Height: Ht Readings from Last 1 Encounters:  09/21/14 5\' 7"  (1.702 m)    Weight: Wt Readings from Last 1 Encounters:  09/24/14 136 lb 0.4 oz (61.7 kg)    Ideal Body Weight: 135 lbs  % Ideal Body Weight: 101%  Wt Readings from Last 10 Encounters:  09/24/14 136 lb 0.4 oz (61.7 kg)  07/02/14 132 lb (59.875 kg)  05/09/14 138 lb (62.596 kg)  11/07/13 133 lb (60.328 kg)  05/09/13 129 lb 3.2 oz (58.605 kg)  04/10/13 128 lb (58.06 kg)  11/02/12 135 lb 9.6 oz (61.508 kg)  04/20/12 133 lb 6.4 oz (60.51 kg)  03/15/12 131 lb (59.421 kg)  10/12/11 129 lb 12.8 oz (58.877 kg)    Usual Body Weight: 138 lbs  % Usual Body Weight: 99%  BMI:  Body Mass Index: 21.35 kg/(m^2)  Estimated Nutritional Needs: Kcal: 1800-2000 Protein: 75-90 grams Fluid: 1.8 - 2 L/day  Skin: Incision on right hip, non-pitting RLE edema  Diet Order: Diet NPO time specified Except for: Ice Chips  EDUCATION NEEDS: -No education needs identified at this time   Intake/Output Summary (Last 24 hours) at 10/12/14 1106 Last data filed at 10/12/14 0600  Gross per 24 hour  Intake   2280 ml  Output      0 ml  Net   2280 ml    Last BM: 3/8  Labs:   Recent Labs Lab 10/10/14 2211 10/11/14 0725 10/12/14 0457  NA 135 136 138  K 4.6 3.9 4.1  CL 100 97 101  CO2 27 35* 30  BUN 14 10 8   CREATININE 0.76 0.65 0.69  CALCIUM 8.7 8.5 8.3*  GLUCOSE 136* 103* 104*    CBG (last 3)  No results for input(s): GLUCAP in the last 72 hours.  Scheduled Meds: . cefTRIAXone (ROCEPHIN)  IV  1 g Intravenous Daily  . chlorpheniramine-HYDROcodone  5 mL Oral Once  . feeding supplement (ENSURE COMPLETE)  237 mL Oral BID BM  . levothyroxine  75 mcg Oral Daily  . senna-docusate  1 tablet Oral QHS    Continuous Infusions: . sodium chloride 75 mL/hr at 10/12/14 0447    Past Medical History  Diagnosis Date  . Hyperlipidemia    . Irregular heartbeat   . Arthritis   . Fibromyalgia   . CHF (congestive heart failure)   . Coronary artery disease   . Hiatal hernia   . COPD (chronic obstructive pulmonary disease)   . Osteoporosis   . Anxiety   . Depression   . Rotator cuff tear     RIGHT  . Complication of anesthesia     " I have to have a spinal beacuse my lungs & heart are bad "  . UTI (urinary tract infection) 09/2014    Past Surgical History  Procedure Laterality Date  . Appendectomy    . Dilation and curettage of uterus      x2  . Elbow surgery      Right  . Cataract extraction      Left  . Hip arthroplasty Right 09/22/2014    Procedure: ARTHROPLASTY BIPOLAR HIP;  Surgeon: [name], MD;  Location: [place];  Service: Orthopedics;  Laterality: Right;  . Tonsillectomy      Kallie Locks, MS, RD, LDN Pager # [phone] After hours/ weekend pager # [phone]

## 2014-10-12 NOTE — Anesthesia Preprocedure Evaluation (Addendum)
Anesthesia Evaluation  Patient identified by MRN, date of birth, ID band Patient awake    Reviewed: Allergy & Precautions, NPO status , Patient's Chart, lab work & pertinent test results  History of Anesthesia Complications (+) PONV  Airway Mallampati: II  TM Distance: >3 FB Neck ROM: Full    Dental  (+) Teeth Intact   Pulmonary COPD COPD inhaler,          Cardiovascular + CAD and +CHF + Valvular Problems/Murmurs     Neuro/Psych Anxiety Depression  Neuromuscular disease    GI/Hepatic hiatal hernia,   Endo/Other    Renal/GU      Musculoskeletal  (+) Arthritis -, Fibromyalgia -, narcotic dependent  Abdominal   Peds  Hematology   Anesthesia Other Findings EF 65-70%.    Reproductive/Obstetrics                          Anesthesia Physical  Anesthesia Plan  ASA: III  Anesthesia Plan:    Post-op Pain Management:    Induction: Intravenous  Airway Management Planned: Natural Airway and Simple Face Mask  Additional Equipment:   Intra-op Plan:   Post-operative Plan:   Informed Consent:   Plan Discussed with: CRNA  Anesthesia Plan Comments:         Anesthesia Quick Evaluation

## 2014-10-12 NOTE — Progress Notes (Signed)
Pt began having new/sudden hemoptysis and cough. NP on call, K. Schorr, notified. Received orders for 25mL Tussionex and for pt to eat ice chips. Pt agreed to ice chips, but refused Tussionex. Following consumption of ice chips, pt found to be resting comfortably. Hemoptysis and coughing subsided. Nursing will continue to monitor.

## 2014-10-12 NOTE — Care Management Note (Signed)
CARE MANAGEMENT NOTE 10/12/2014  Patient:  [name], [name]   Account Number:  [id number]  Date Initiated:  10/12/2014  Documentation initiated by:  [facility]  Subjective/Objective Assessment:   periprosthetic fx rt lesser trochanter     Action/Plan:   plan return to [facility]   Anticipated DC Date:  10/15/2014   Anticipated DC Plan:  [place] referral  Clinical Social Worker      DC Planning Services  CM consult      Choice offered to / List presented to:             Status of service:  In process, will continue to follow Medicare Important Message given?   (If response is "NO", the following Medicare IM given date fields will be blank) Date Medicare IM given:   Medicare IM given by:   Date Additional Medicare IM given:  10/12/2014 Additional Medicare IM given by:  Norina Buzzard  Discharge Disposition:    Per UR Regulation:    If discussed at Long Length of Stay Meetings, dates discussed:    Comments:

## 2014-10-12 NOTE — Progress Notes (Addendum)
PROGRESS NOTE  [name] RKY:[id number] DOB: 02-26-1927 DOA: 10/10/2014 PCP: [name], MD  HPI/Recap of past 24 hours: Has brief episode of hemoptysis last night, denies sob or chest pain, h/h stable this am. Awaiting for surgery. Fiance in room  Assessment/Plan: Active Problems:   Hip fracture   COPD exacerbation   Acute cystitis without hematuria   Thyroid activity decreased  R proximal femur periprosthetic fx -h/o Right hip hemiarthroplasty on 2/20 -orhto consulted, to OR today on 3/12, need to wait to be off xarelto 72hrs. -DVT prophylaxis per ortho  UTI?  -was diagnosed prior to admission, was to start on abx on 3/9 but was sent to the hospital -denies symptom, no fever, mild leukocytosis on admission -urine culture pending, on rocephin  Copd/chronic bronchitis:  Lung clear, prn nebs, had few episodes of hemoptysis (blood tinged sputum) last nigh and this morning, will continue monitor. On prn nebs/antitussive  Anxiety: start prn klonopin.  Code Status: partial  Family Communication: patient and fiance  Disposition Plan: remain in patient   Consultants:  ortho  Procedures: revision of right hip femoral component 3/12  Antibiotics:  recephin   Objective: BP 164/68 mmHg  Pulse 82  Temp(Src) 98.2 F (36.8 C) (Oral)  Resp 16  SpO2 100%  Intake/Output Summary (Last 24 hours) at 10/12/14 1012 Last data filed at 10/12/14 0600  Gross per 24 hour  Intake   2280 ml  Output      0 ml  Net   2280 ml   There were no vitals filed for this visit.  Exam:   General:  AAox3, frail  Cardiovascular: RRR  Respiratory: CTABL, no wheezing  Abdomen: sot/NT/ND, positive bowel sounds  Musculoskeletal: right hip tender to palpation, limited range of motion,. No edema   Data Reviewed: Basic Metabolic Panel:  Recent Labs Lab 10/10/14 2211 10/11/14 0725 10/12/14 0457  NA 135 136 138  K 4.6 3.9 4.1  CL 100 97 101  CO2 27 35* 30    GLUCOSE 136* 103* 104*  BUN 14 10 8   CREATININE 0.76 0.65 0.69  CALCIUM 8.7 8.5 8.3*   Liver Function Tests: No results for input(s): AST, ALT, ALKPHOS, BILITOT, PROT, ALBUMIN in the last 168 hours. No results for input(s): LIPASE, AMYLASE in the last 168 hours. No results for input(s): AMMONIA in the last 168 hours. CBC:  Recent Labs Lab 10/10/14 2211 10/11/14 0725 10/12/14 0457  WBC 11.2* 8.5 7.5  NEUTROABS 9.2*  --   --   HGB 12.2 11.6* 11.0*  HCT 38.1 36.4 34.0*  MCV 95.5 98.4 96.6  PLT 259 251 223   Cardiac Enzymes:   No results for input(s): CKTOTAL, CKMB, CKMBINDEX, TROPONINI in the last 168 hours. BNP (last 3 results) No results for input(s): BNP in the last 8760 hours.  ProBNP (last 3 results) No results for input(s): PROBNP in the last 8760 hours.  CBG: No results for input(s): GLUCAP in the last 168 hours.  No results found for this or any previous visit (from the past 240 hour(s)).   Studies: No results found.  Scheduled Meds: . cefTRIAXone (ROCEPHIN)  IV  1 g Intravenous Daily  . chlorpheniramine-HYDROcodone  5 mL Oral Once  . feeding supplement (ENSURE COMPLETE)  237 mL Oral BID BM  . levothyroxine  75 mcg Oral Daily  . senna-docusate  1 tablet Oral QHS    Continuous Infusions: . sodium chloride 75 mL/hr at 10/12/14 0447  Raylyn Carton  [facility] Pager [phone]. If 7PM-7AM, please contact night-coverage at [email or web address], password [facility] 10/12/2014, 10:12 AM  LOS: 1 day

## 2014-10-12 NOTE — Progress Notes (Signed)
Dr. Lyla Glassing at bedside, surgery to be cancelled due to pt being unable to have general anesthesia or spinal block due to pt taking coumadin. OR desk also aware, this nurse told they are arranging for transport back to [facility]. [facility] RN also called and informed. Diet changed and tray ordered, told by nutritional services that tray would be sent to pt's room.

## 2014-10-12 NOTE — Progress Notes (Signed)
Blood tinged mucous in kleenex x 2 from 'coughing spell' approximately 1045. Respirations regular/ unlabored @ 16/ minute, room air, O2 SATs 98. Seems anxious AEB repeatedly asking questions, asking about her O2 Sat level, reassurance provided. Attending informed. Ortho Case Manager inquired.

## 2014-10-12 NOTE — Progress Notes (Signed)
Upon discussion with anesthesia, patient desires spinal anesthesia. She had xarelto within 72 hrs, so she must wait until tomorrow am. Thus surgery will be postponed until tomorrow.   NPO after MN tonight Hold anticoags

## 2014-10-12 NOTE — Progress Notes (Signed)
OR prep complete. Report given to OR via phone. Patient to OR via bed with two Techs.

## 2014-10-13 ENCOUNTER — Encounter (HOSPITAL_COMMUNITY)
Admission: EM | Disposition: A | Discharge status: Skilled Nursing Facility | Payer: Self-pay | Source: Home / Self Care | Attending: Internal Medicine

## 2014-10-13 ENCOUNTER — Inpatient Hospital Stay (HOSPITAL_COMMUNITY): Payer: Medicare Other | Admitting: Certified Registered Nurse Anesthetist

## 2014-10-13 ENCOUNTER — Inpatient Hospital Stay (HOSPITAL_COMMUNITY): Payer: Medicare Other

## 2014-10-13 DIAGNOSIS — M978XXA Periprosthetic fracture around other internal prosthetic joint, initial encounter: Secondary | ICD-10-CM

## 2014-10-13 DIAGNOSIS — Z96649 Presence of unspecified artificial hip joint: Secondary | ICD-10-CM

## 2014-10-13 DIAGNOSIS — T84040A Periprosthetic fracture around internal prosthetic right hip joint, initial encounter: Principal | ICD-10-CM

## 2014-10-13 DIAGNOSIS — E43 Unspecified severe protein-calorie malnutrition: Secondary | ICD-10-CM

## 2014-10-13 DIAGNOSIS — Z96641 Presence of right artificial hip joint: Secondary | ICD-10-CM

## 2014-10-13 HISTORY — PX: TOTAL HIP REVISION: SHX763

## 2014-10-13 LAB — URINE CULTURE: Colony Count: >=100000

## 2014-10-13 LAB — PREPARE RBC (CROSSMATCH)

## 2014-10-13 SURGERY — TOTAL HIP REVISION
Anesthesia: Choice | Site: Hip | Laterality: Right

## 2014-10-13 MED ORDER — FENTANYL CITRATE 0.05 MG/ML IJ SOLN
25.0000 ug | INTRAMUSCULAR | Status: DC | PRN
Start: 2014-10-13 — End: 2014-10-13
  Administered 2014-10-13 (×2): 25 ug via INTRAVENOUS
  Administered 2014-10-13: 50 ug via INTRAVENOUS

## 2014-10-13 MED ORDER — ONDANSETRON HCL 4 MG/2ML IJ SOLN
INTRAMUSCULAR | Status: DC | PRN
  Administered 2014-10-13: 4 mg via INTRAVENOUS

## 2014-10-13 MED ORDER — ONDANSETRON HCL 4 MG/2ML IJ SOLN: 4.0000 mg | Freq: Four times a day (QID) | INTRAMUSCULAR | Status: DC | PRN

## 2014-10-13 MED ORDER — ONDANSETRON HCL 4 MG PO TABS
4.0000 mg | ORAL_TABLET | Freq: Four times a day (QID) | ORAL | Status: DC | PRN
  Administered 2014-10-17: 4 mg via ORAL
  Filled 2014-10-13: qty 1

## 2014-10-13 MED ORDER — ONDANSETRON HCL 4 MG/2ML IJ SOLN: 4.0000 mg | Freq: Once | INTRAMUSCULAR | Status: DC | PRN

## 2014-10-13 MED ORDER — PROPOFOL INFUSION 10 MG/ML OPTIME
INTRAVENOUS | Status: DC | PRN
  Administered 2014-10-13: 50 ug/kg/min via INTRAVENOUS

## 2014-10-13 MED ORDER — MIDAZOLAM HCL 2 MG/2ML IJ SOLN
INTRAMUSCULAR | Status: DC | PRN
  Administered 2014-10-13: 2 mg via INTRAVENOUS

## 2014-10-13 MED ORDER — ASPIRIN EC 325 MG PO TBEC
325.0000 mg | DELAYED_RELEASE_TABLET | Freq: Every day | ORAL | Status: DC
  Administered 2014-10-14: 325 mg via ORAL
  Filled 2014-10-13: qty 1

## 2014-10-13 MED ORDER — ONDANSETRON HCL 4 MG/2ML IJ SOLN
INTRAMUSCULAR | Status: AC
  Filled 2014-10-13: qty 2

## 2014-10-13 MED ORDER — HYDROMORPHONE HCL 1 MG/ML IJ SOLN
0.2000 mg | INTRAMUSCULAR | Status: DC | PRN
  Administered 2014-10-13 – 2014-10-15 (×7): 0.2 mg via INTRAVENOUS
  Filled 2014-10-13 (×7): qty 1

## 2014-10-13 MED ORDER — PHENOL 1.4 % MT LIQD: 1.0000 | OROMUCOSAL | Status: DC | PRN

## 2014-10-13 MED ORDER — LACTATED RINGERS IV SOLN
INTRAVENOUS | Status: DC | PRN
  Administered 2014-10-13 (×3): via INTRAVENOUS

## 2014-10-13 MED ORDER — METOCLOPRAMIDE HCL 5 MG/ML IJ SOLN
5.0000 mg | Freq: Three times a day (TID) | INTRAMUSCULAR | Status: DC | PRN
  Administered 2014-10-16: 10 mg via INTRAVENOUS
  Filled 2014-10-13: qty 2

## 2014-10-13 MED ORDER — ALBUMIN HUMAN 5 % IV SOLN
INTRAVENOUS | Status: DC | PRN
  Administered 2014-10-13 (×2): via INTRAVENOUS

## 2014-10-13 MED ORDER — ACETAMINOPHEN 650 MG RE SUPP: 650.0000 mg | Freq: Three times a day (TID) | RECTAL | Status: DC

## 2014-10-13 MED ORDER — TRANEXAMIC ACID 100 MG/ML IV SOLN
1000.0000 mg | INTRAVENOUS | Status: AC
  Administered 2014-10-13: 1000 mg via INTRAVENOUS
  Filled 2014-10-13: qty 10

## 2014-10-13 MED ORDER — TRAMADOL HCL 50 MG PO TABS
50.0000 mg | ORAL_TABLET | Freq: Four times a day (QID) | ORAL | Status: DC | PRN
  Administered 2014-10-13 – 2014-10-17 (×9): 50 mg via ORAL
  Filled 2014-10-13 (×10): qty 1

## 2014-10-13 MED ORDER — CEFAZOLIN SODIUM-DEXTROSE 2-3 GM-% IV SOLR
INTRAVENOUS | Status: DC | PRN
  Administered 2014-10-13 (×2): 2 g via INTRAVENOUS

## 2014-10-13 MED ORDER — CEFAZOLIN SODIUM-DEXTROSE 2-3 GM-% IV SOLR
INTRAVENOUS | Status: AC
  Filled 2014-10-13: qty 100

## 2014-10-13 MED ORDER — SODIUM CHLORIDE 0.9 % IJ SOLN
INTRAMUSCULAR | Status: AC
  Filled 2014-10-13: qty 10

## 2014-10-13 MED ORDER — PROMETHAZINE HCL 25 MG/ML IJ SOLN: 6.2500 mg | INTRAMUSCULAR | Status: DC | PRN

## 2014-10-13 MED ORDER — MIDAZOLAM HCL 2 MG/2ML IJ SOLN
INTRAMUSCULAR | Status: AC
  Filled 2014-10-13: qty 2

## 2014-10-13 MED ORDER — LIDOCAINE HCL (CARDIAC) 20 MG/ML IV SOLN
INTRAVENOUS | Status: DC | PRN
  Administered 2014-10-13: 40 mg via INTRAVENOUS

## 2014-10-13 MED ORDER — ACETAMINOPHEN 500 MG PO TABS
1000.0000 mg | ORAL_TABLET | Freq: Three times a day (TID) | ORAL | Status: DC
  Administered 2014-10-13 – 2014-10-17 (×13): 1000 mg via ORAL
  Filled 2014-10-13 (×13): qty 2

## 2014-10-13 MED ORDER — PROPOFOL 10 MG/ML IV BOLUS
INTRAVENOUS | Status: DC | PRN
  Administered 2014-10-13 (×2): 20 mg via INTRAVENOUS

## 2014-10-13 MED ORDER — FENTANYL CITRATE 0.05 MG/ML IJ SOLN
INTRAMUSCULAR | Status: DC | PRN
  Administered 2014-10-13: 150 ug via INTRAVENOUS
  Administered 2014-10-13: 100 ug via INTRAVENOUS

## 2014-10-13 MED ORDER — KETOROLAC TROMETHAMINE 15 MG/ML IJ SOLN
7.5000 mg | Freq: Four times a day (QID) | INTRAMUSCULAR | Status: AC
  Administered 2014-10-13 – 2014-10-14 (×4): 7.5 mg via INTRAVENOUS
  Filled 2014-10-13 (×4): qty 1

## 2014-10-13 MED ORDER — SODIUM CHLORIDE 0.9 % IV SOLN
INTRAVENOUS | Status: DC | PRN
  Administered 2014-10-13 (×2): via INTRAVENOUS

## 2014-10-13 MED ORDER — MENTHOL 3 MG MT LOZG: 1.0000 | LOZENGE | OROMUCOSAL | Status: DC | PRN

## 2014-10-13 MED ORDER — METOCLOPRAMIDE HCL 10 MG PO TABS: 5.0000 mg | ORAL_TABLET | Freq: Three times a day (TID) | ORAL | Status: DC | PRN

## 2014-10-13 MED ORDER — EPHEDRINE SULFATE 50 MG/ML IJ SOLN
INTRAMUSCULAR | Status: DC | PRN
  Administered 2014-10-13: 10 mg via INTRAVENOUS

## 2014-10-13 MED ORDER — LEVOTHYROXINE SODIUM 75 MCG PO TABS
75.0000 ug | ORAL_TABLET | Freq: Every day | ORAL | Status: DC
  Administered 2014-10-14 – 2014-10-17 (×4): 75 ug via ORAL
  Filled 2014-10-13 (×4): qty 1

## 2014-10-13 MED ORDER — 0.9 % SODIUM CHLORIDE (POUR BTL) OPTIME
TOPICAL | Status: DC | PRN
  Administered 2014-10-13 (×2): 1000 mL

## 2014-10-13 MED ORDER — WHITE PETROLATUM GEL
Status: AC
  Administered 2014-10-13: 23:00:00
  Filled 2014-10-13: qty 1

## 2014-10-13 MED ORDER — DEXTROSE 5 % IV SOLN
10.0000 mg | INTRAVENOUS | Status: DC | PRN
  Administered 2014-10-13: 40 ug/min via INTRAVENOUS

## 2014-10-13 MED ORDER — OXYCODONE HCL 5 MG/5ML PO SOLN: 5.0000 mg | Freq: Once | ORAL | Status: DC | PRN

## 2014-10-13 MED ORDER — FENTANYL CITRATE 0.05 MG/ML IJ SOLN
INTRAMUSCULAR | Status: AC
  Filled 2014-10-13: qty 2

## 2014-10-13 MED ORDER — SODIUM CHLORIDE 0.9 % IR SOLN
Status: DC | PRN
  Administered 2014-10-13: 3000 mL

## 2014-10-13 MED ORDER — EPHEDRINE SULFATE 50 MG/ML IJ SOLN
INTRAMUSCULAR | Status: AC
  Filled 2014-10-13: qty 1

## 2014-10-13 MED ORDER — PROPOFOL 10 MG/ML IV EMUL
5.0000 ug/kg/min | INTRAVENOUS | Status: DC
  Filled 2014-10-13: qty 100

## 2014-10-13 MED ORDER — OXYCODONE HCL 5 MG PO TABS: 5.0000 mg | ORAL_TABLET | Freq: Once | ORAL | Status: DC | PRN

## 2014-10-13 MED ORDER — LACTATED RINGERS IV SOLN
INTRAVENOUS | Status: DC
  Administered 2014-10-13: 09:00:00 via INTRAVENOUS

## 2014-10-13 MED ORDER — FENTANYL CITRATE 0.05 MG/ML IJ SOLN
INTRAMUSCULAR | Status: AC
  Filled 2014-10-13: qty 5

## 2014-10-13 MED ORDER — CEFAZOLIN SODIUM-DEXTROSE 2-3 GM-% IV SOLR
2.0000 g | Freq: Four times a day (QID) | INTRAVENOUS | Status: AC
  Administered 2014-10-13 (×2): 2 g via INTRAVENOUS
  Filled 2014-10-13 (×3): qty 50

## 2014-10-13 MED ORDER — HYDROMORPHONE HCL 1 MG/ML IJ SOLN: 0.2500 mg | INTRAMUSCULAR | Status: DC | PRN

## 2014-10-13 MED ORDER — HYDROMORPHONE HCL 1 MG/ML IJ SOLN
INTRAMUSCULAR | Status: AC
  Filled 2014-10-13: qty 1

## 2014-10-13 SURGICAL SUPPLY — 68 items
ADH SKN CLS APL DERMABOND .7 (GAUZE/BANDAGES/DRESSINGS) ×1
BAG DECANTER FOR FLEXI CONT (MISCELLANEOUS) ×3 IMPLANT
BLADE SAW SGTL 18X1.27X75 (BLADE) ×1 IMPLANT
BLADE SAW SGTL 18X1.27X75MM (BLADE)
CABLE (Orthopedic Implant) ×10 IMPLANT
CLOSURE SKIN 1/8X3 (GAUZE/BANDAGES/DRESSINGS) ×1
DERMABOND ADVANCED (GAUZE/BANDAGES/DRESSINGS) ×2
DERMABOND ADVANCED .7 DNX12 (GAUZE/BANDAGES/DRESSINGS) IMPLANT
DRAPE C-ARM 42X72 X-RAY (DRAPES) ×2 IMPLANT
DRAPE HIP W/POCKET STRL (DRAPE) ×2 IMPLANT
DRAPE INCISE IOBAN 85X60 (DRAPES) ×5 IMPLANT
DRAPE ORTHO SPLIT 77X108 STRL (DRAPES) ×6
DRAPE POUCH INSTRU U-SHP 10X18 (DRAPES) ×3 IMPLANT
DRAPE PROXIMA HALF (DRAPES) ×8 IMPLANT
DRAPE SURG 17X11 SM STRL (DRAPES) ×5 IMPLANT
DRAPE SURG ORHT 6 SPLT 77X108 (DRAPES) ×2 IMPLANT
DRAPE U-SHAPE 47X51 STRL (DRAPES) ×3 IMPLANT
DRSG AQUACEL AG ADV 3.5X10 (GAUZE/BANDAGES/DRESSINGS) ×3 IMPLANT
DRSG AQUACEL AG ADV 3.5X14 (GAUZE/BANDAGES/DRESSINGS) ×2 IMPLANT
DRSG TEGADERM 4X4.75 (GAUZE/BANDAGES/DRESSINGS) ×3 IMPLANT
DURAPREP 26ML APPLICATOR (WOUND CARE) ×3 IMPLANT
ELECT BLADE TIP CTD 4 INCH (ELECTRODE) ×3 IMPLANT
ELECT REM PT RETURN 9FT ADLT (ELECTROSURGICAL) ×3
ELECTRODE REM PT RTRN 9FT ADLT (ELECTROSURGICAL) ×1 IMPLANT
FACESHIELD WRAPAROUND (MASK) ×3 IMPLANT
FACESHIELD WRAPAROUND OR TEAM (MASK) ×4 IMPLANT
GAUZE SPONGE 2X2 8PLY STRL LF (GAUZE/BANDAGES/DRESSINGS) ×1 IMPLANT
GLOVE BIO SURGEON STRL SZ8 (GLOVE) ×4 IMPLANT
GLOVE BIOGEL PI IND STRL 7.5 (GLOVE) ×1 IMPLANT
GLOVE BIOGEL PI IND STRL 8.5 (GLOVE) ×1 IMPLANT
GLOVE BIOGEL PI INDICATOR 7.5 (GLOVE) ×2
GLOVE BIOGEL PI INDICATOR 8.5 (GLOVE) ×2
GLOVE ECLIPSE 8.0 STRL XLNG CF (GLOVE) ×3 IMPLANT
GLOVE ORTHO TXT STRL SZ7.5 (GLOVE) ×6 IMPLANT
GOWN SPEC L3 XXLG W/TWL (GOWN DISPOSABLE) ×6 IMPLANT
GOWN STRL REUS W/TWL LRG LVL3 (GOWN DISPOSABLE) ×3 IMPLANT
HANDPIECE INTERPULSE COAX TIP (DISPOSABLE) ×3
HEAD FEM UNIPOLAR 48 OD (Hips) ×2 IMPLANT
KIT BASIN OR (CUSTOM PROCEDURE TRAY) ×3 IMPLANT
LIQUID BAND (GAUZE/BANDAGES/DRESSINGS) ×3 IMPLANT
MANIFOLD NEPTUNE II (INSTRUMENTS) ×3 IMPLANT
NDL SAFETY ECLIPSE 18X1.5 (NEEDLE) ×1 IMPLANT
NEEDLE HYPO 18GX1.5 SHARP (NEEDLE) ×3
NS IRRIG 1000ML POUR BTL (IV SOLUTION) ×3 IMPLANT
PACK TOTAL JOINT (CUSTOM PROCEDURE TRAY) ×3 IMPLANT
PADDING CAST COTTON 6X4 STRL (CAST SUPPLIES) ×3 IMPLANT
POSITIONER SURGICAL ARM (MISCELLANEOUS) ×3 IMPLANT
SEALER BIPOLAR AQUA 6.0 (INSTRUMENTS) ×2 IMPLANT
SET HNDPC FAN SPRY TIP SCT (DISPOSABLE) IMPLANT
SPACER FEM TAPERED +5 12/14 (Hips) ×2 IMPLANT
SPONGE GAUZE 2X2 STER 10/PKG (GAUZE/BANDAGES/DRESSINGS) ×2
STAPLER VISISTAT 35W (STAPLE) ×4 IMPLANT
STEM FEM CMNTLSS SM AML 13.5 (Hips) ×2 IMPLANT
STRIP CLOSURE SKIN 1/8X3 (GAUZE/BANDAGES/DRESSINGS) ×1 IMPLANT
SUCTION FRAZIER TIP 10 FR DISP (SUCTIONS) ×3 IMPLANT
SUT ETHIBOND NAB CT1 #1 30IN (SUTURE) ×2 IMPLANT
SUT MNCRL AB 4-0 PS2 18 (SUTURE) ×1 IMPLANT
SUT MON AB 2-0 CT1 36 (SUTURE) ×5 IMPLANT
SUT VIC AB 0 CT1 27 (SUTURE) ×6
SUT VIC AB 0 CT1 27XBRD ANBCTR (SUTURE) IMPLANT
SUT VIC AB 1 CT1 27 (SUTURE) ×9
SUT VIC AB 1 CT1 27XBRD ANBCTR (SUTURE) ×3 IMPLANT
SUT VIC AB 2-0 CT1 27 (SUTURE) ×6
SUT VIC AB 2-0 CT1 TAPERPNT 27 (SUTURE) ×2 IMPLANT
SUT VLOC 180 0 24IN GS25 (SUTURE) ×2 IMPLANT
SYR 50ML LL SCALE MARK (SYRINGE) ×3 IMPLANT
TOWEL OR 17X26 10 PK STRL BLUE (TOWEL DISPOSABLE) ×6 IMPLANT
TRAY FOLEY CATH 16FRSI W/METER (SET/KITS/TRAYS/PACK) ×3 IMPLANT

## 2014-10-13 NOTE — Transfer of Care (Signed)
Immediate Anesthesia Transfer of Care Note  Patient: [name]  Procedure(s) Performed: Procedure(s): REVISION RIGHT TOTAL HIP POSTERIOR  (Right)  Patient Location: PACU  Anesthesia Type:Spinal  Level of Consciousness: awake  Airway & Oxygen Therapy: Patient Spontanous Breathing and Patient connected to nasal cannula oxygen  Post-op Assessment: Report given to RN and Post -op Vital signs reviewed and stable  Post vital signs: Reviewed and stable  Last Vitals:  Filed Vitals:   10/13/14 0600  BP: 153/63  Pulse: 82  Temp: 36.6 C  Resp: 16    Complications: No apparent anesthesia complications

## 2014-10-13 NOTE — Interval H&P Note (Signed)
History and Physical Interval Note:  10/13/2014 9:53 AM  [name]  has presented today for surgery, with the diagnosis of PERIPROSTETH RIGHT FEMUR FRACTURE   The various methods of treatment have been discussed with the patient and family. After consideration of risks, benefits and other options for treatment, the patient has consented to  Procedure(s): REVISION RIGHT TOTAL HIP POSTERIOR  (Right) as a surgical intervention .  The patient's history has been reviewed, patient examined, no change in status, stable for surgery.  I have reviewed the patient's chart and labs.  Questions were answered to the patient's satisfaction.     Ahava Kissoon, Horald Pollen

## 2014-10-13 NOTE — Brief Op Note (Signed)
10/10/2014 - 10/13/2014  2:42 PM  PATIENT:  [name]  81 y.o. female  PRE-OPERATIVE DIAGNOSIS:  PERIPROSTETH RIGHT FEMUR FRACTURE   POST-OPERATIVE DIAGNOSIS:  PERIPROSTETH RIGHT FEMUR FRACTURE   PROCEDURE:  Procedure(s): REVISION RIGHT TOTAL HIP POSTERIOR  (Right)  SURGEON:  Surgeon(s) and Role:    * Rod Can, MD - Primary  PHYSICIAN ASSISTANT: N/A.  ASSISTANTS: none   ANESTHESIA:   spinal  EBL:  Total I/O In: 4320 [I.V.:3150; Blood:670; IV Piggyback:500] Out: 1400 [Urine:600; Blood:800]  BLOOD ADMINISTERED:1 CC PRBC  DRAINS: (medium) Hemovact drain(s) in the right hip with  Suction Open   LOCAL MEDICATIONS USED:  NONE  SPECIMEN:  Source of Specimen:  right hip fluid  DISPOSITION OF SPECIMEN:  micro  COUNTS:  YES  TOURNIQUET:  * No tourniquets in log *  DICTATION: .Other Dictation: Dictation Number pending  PLAN OF CARE: Admit to inpatient   PATIENT DISPOSITION:  PACU - hemodynamically stable.   Delay start of Pharmacological VTE agent (>24hrs) due to surgical blood loss or risk of bleeding: not applicable

## 2014-10-13 NOTE — Progress Notes (Signed)
Subjective: She is doing fair this morning. Surgery postponed from yesterday due to anticoag concerns. Patient has been NPO. Surgery planned with [facility] today.   Objective: Vital signs in last 24 hours: Temp:  [97.8 F (36.6 C)-98.5 F (36.9 C)] 97.8 F (36.6 C) (03/12 0600) Pulse Rate:  [80-82] 82 (03/12 0600) Resp:  [16-18] 16 (03/12 0600) BP: (142-153)/(61-63) 153/63 mmHg (03/12 0600) SpO2:  [97 %-99 %] 99 % (03/12 0600)    Recent Labs  10/10/14 2211 10/11/14 0725 10/12/14 0457  HGB 12.2 11.6* 11.0*    Recent Labs  10/11/14 0725 10/12/14 0457  WBC 8.5 7.5  RBC 3.70* 3.52*  HCT 36.4 34.0*  PLT 251 223    Recent Labs  10/11/14 0725 10/12/14 0457  NA 136 138  K 3.9 4.1  CL 97 101  CO2 35* 30  BUN 10 8  CREATININE 0.65 0.69  GLUCOSE 103* 104*  CALCIUM 8.5 8.3*    Recent Labs  10/10/14 2211  INR 1.20    Neurologically intact Intact pulses distally Dorsiflexion/Plantar flexion intact  Assessment/Plan: Right hip, periprosthetic fracture Plan for OR today with [facility] Patient to remain NPO   Waqas Bruhl LAUREN 10/13/2014, 7:51 AM

## 2014-10-13 NOTE — Anesthesia Postprocedure Evaluation (Signed)
  Anesthesia Post-op Note  Patient: [name]  Procedure(s) Performed: Procedure(s): REVISION RIGHT TOTAL HIP POSTERIOR  (Right)  Patient Location: PACU  Anesthesia Type:MAC and Spinal  Level of Consciousness: awake, oriented, sedated and patient cooperative  Airway and Oxygen Therapy: Patient Spontanous Breathing  Post-op Pain: none  Post-op Assessment: Post-op Vital signs reviewed, Patient's Cardiovascular Status Stable, Respiratory Function Stable, Patent Airway, No signs of Nausea or vomiting and Pain level controlled  Post-op Vital Signs: stable  Last Vitals:  Filed Vitals:   10/13/14 1518  BP: 142/94  Pulse: 81  Temp:   Resp: 14    Complications: No apparent anesthesia complications

## 2014-10-13 NOTE — Anesthesia Procedure Notes (Addendum)
Spinal Patient location during procedure: OR Start time: 10/13/2014 10:10 AM End time: 10/13/2014 10:15 AM Staffing Performed by: anesthesiologist  Preanesthetic Checklist Completed: patient identified, site marked, surgical consent, pre-op evaluation, timeout performed, IV checked, risks and benefits discussed and monitors and equipment checked Spinal Block Patient position: right lateral decubitus Prep: ChloraPrep Patient monitoring: blood pressure, continuous pulse ox, cardiac monitor and heart rate Approach: right paramedian Location: L3-4 Injection technique: single-shot Needle Needle type: Tuohy  Needle gauge: 22 G Needle length: 9 cm Assessment Sensory level: T10 Additional Notes 10 mg 0.75% bupivacaine with 1:200 epi injected easily  Date/Time: 10/13/2014 10:05 AM Performed by: Marinda Elk A Pre-anesthesia Checklist: Patient identified, Timeout performed, Emergency Drugs available, Suction available and Patient being monitored Oxygen Delivery Method: Simple face mask

## 2014-10-13 NOTE — Progress Notes (Signed)
PROGRESS NOTE  [name] CBU:[id number] DOB: 10-24-1926 DOA: 10/10/2014 PCP: [name], MD  HPI: 81 y.o. female with a recent Right THA on 09/22/2014 who was discharged to the [facility] but she continued to have increased pain and was unable to bear weight on her rt leg and unable to participate with her physical therapy. At the facility an X-ray of the Right hip was performed, which revealed extension of the fracture above the hardware  Subjective / 24 H Interval events Ongoing hip discomfort Hemoptysis yesterday, none overnight; history of chronic hemoptysis per chart review. Patient states it was never this bad.   Assessment/Plan: Principal Problem:   Periprosthetic fracture around internal prosthetic right hip joint Active Problems:   Hip fracture   COPD exacerbation   Acute cystitis without hematuria   Thyroid activity decreased   Protein-calorie malnutrition, severe  Acute fracture of the lesser trochanter of the proximal right femur  - orthopedic surgery consulted, patient for surgery 3/12  UTI - on Rocephin, cultures pending. Likely cultures will be negative since she has already been on antibiotics.   COPD/bronchiectasis - mild hemoptysis 3/11, unclear quantity - Hb stable, no increased oxygen needs - closely monitor, avoid anticoagulation for now  Hypothyroidism - continue synthroid   Diet: Diet NPO time specified Fluids: none  DVT Prophylaxis: per ortho  Code Status: Partial Code Family Communication: d/w friend bedside  Disposition Plan: remain inpatient  Consultants:  Orthopedic surgery   Procedures:  None    Antibiotics Ceftriaxone 3/10 >>   Studies  No results found.  Objective  Filed Vitals:   10/11/14 1942 10/12/14 0426 10/12/14 2116 10/13/14 0600  BP: 122/58 164/68 142/61 153/63  Pulse: 80 82 80 82  Temp: 98.5 F (36.9 C) 98.2 F (36.8 C) 98.5 F (36.9 C) 97.8 F (36.6 C)  TempSrc:     Oral  Resp: 16 16 18 16   SpO2: 98% 100% 97% 99%   Exam:  General:  NAD  HEENT: no scleral icterus  Cardiovascular: RRR   Respiratory: decreased breath sounds overall, no wheezing  Abdomen: soft, non tender  MSK/Extremities: no clubbing/cyanosis   Skin: no rashes  Neuro: non focal   Data Reviewed: Basic Metabolic Panel:  Recent Labs Lab 10/10/14 2211 10/11/14 0725 10/12/14 0457  NA 135 136 138  K 4.6 3.9 4.1  CL 100 97 101  CO2 27 35* 30  GLUCOSE 136* 103* 104*  BUN 14 10 8   CREATININE 0.76 0.65 0.69  CALCIUM 8.7 8.5 8.3*   CBC:  Recent Labs Lab 10/10/14 2211 10/11/14 0725 10/12/14 0457  WBC 11.2* 8.5 7.5  NEUTROABS 9.2*  --   --   HGB 12.2 11.6* 11.0*  HCT 38.1 36.4 34.0*  MCV 95.5 98.4 96.6  PLT 259 251 223   CBG:  Recent Labs Lab 10/12/14 1626  GLUCAP 94    Recent Results (from the past 240 hour(s))  Culture, Urine     Status: None (Preliminary result)   Collection Time: 10/11/14  9:05 AM  Result Value Ref Range Status   Specimen Description URINE, CLEAN CATCH  Final   Special Requests NONE  Final   Colony Count   Final    >=100,000 COLONIES/ML Performed at [facility]    Culture   Final    ESCHERICHIA COLI Performed at [facility]    Report Status PENDING  Incomplete     Scheduled Meds: . cefTRIAXone (ROCEPHIN)  IV  1  g Intravenous Daily  . chlorpheniramine-HYDROcodone  5 mL Oral Once  . feeding supplement (ENSURE COMPLETE)  237 mL Oral BID BM  . levothyroxine  75 mcg Oral Daily  . senna-docusate  1 tablet Oral QHS   Continuous Infusions: . sodium chloride 75 mL/hr at 10/12/14 2053  . lactated ringers 10 mL/hr at 10/12/14 1443    Marzetta Board, MD [facility] Pager [phone]. If 7 PM - 7 AM, please contact night-coverage at [email or web address], password [facility] 10/13/2014, 7:45 AM  LOS: 2 days

## 2014-10-14 ENCOUNTER — Encounter (HOSPITAL_COMMUNITY): Payer: Self-pay | Admitting: Orthopedic Surgery

## 2014-10-14 LAB — CBC
HEMATOCRIT: 32.8 % — AB (ref 36.0–46.0)
HEMOGLOBIN: 10.8 g/dL — AB (ref 12.0–15.0)
MCH: 30 pg (ref 26.0–34.0)
MCHC: 32.9 g/dL (ref 30.0–36.0)
MCV: 91.1 fL (ref 78.0–100.0)
Platelets: 181 10*3/uL (ref 150–400)
RBC: 3.6 MIL/uL — AB (ref 3.87–5.11)
RDW: 16.5 % — AB (ref 11.5–15.5)
WBC: 7.8 10*3/uL (ref 4.0–10.5)

## 2014-10-14 LAB — TYPE AND SCREEN
ABO/RH(D): O POS
Antibody Screen: NEGATIVE
UNIT DIVISION: 0
Unit division: 0

## 2014-10-14 LAB — BASIC METABOLIC PANEL
ANION GAP: 5 (ref 5–15)
BUN: 8 mg/dL (ref 6–23)
CO2: 31 mmol/L (ref 19–32)
Calcium: 8 mg/dL — ABNORMAL LOW (ref 8.4–10.5)
Chloride: 103 mmol/L (ref 96–112)
Creatinine, Ser: 0.71 mg/dL (ref 0.50–1.10)
GFR calc Af Amer: 89 mL/min — ABNORMAL LOW (ref >90)
GFR calc non Af Amer: 76 mL/min — ABNORMAL LOW (ref >90)
GLUCOSE: 110 mg/dL — AB (ref 70–99)
POTASSIUM: 3.8 mmol/L (ref 3.5–5.1)
SODIUM: 139 mmol/L (ref 135–145)

## 2014-10-14 LAB — POCT I-STAT 4, (NA,K, GLUC, HGB,HCT)
GLUCOSE: 98 mg/dL (ref 70–99)
Glucose, Bld: 110 mg/dL — ABNORMAL HIGH (ref 70–99)
Glucose, Bld: 107 mg/dL — ABNORMAL HIGH (ref 70–99)
HCT: 30 % — ABNORMAL LOW (ref 36.0–46.0)
HCT: 27 % — ABNORMAL LOW (ref 36.0–46.0)
HEMATOCRIT: 24 % — AB (ref 36.0–46.0)
HEMOGLOBIN: 10.2 g/dL — AB (ref 12.0–15.0)
Hemoglobin: 9.2 g/dL — ABNORMAL LOW (ref 12.0–15.0)
Hemoglobin: 8.2 g/dL — ABNORMAL LOW (ref 12.0–15.0)
POTASSIUM: 3.6 mmol/L (ref 3.5–5.1)
Potassium: 3.9 mmol/L (ref 3.5–5.1)
Potassium: 3.7 mmol/L (ref 3.5–5.1)
SODIUM: 138 mmol/L (ref 135–145)
SODIUM: 138 mmol/L (ref 135–145)
Sodium: 139 mmol/L (ref 135–145)

## 2014-10-14 MED ORDER — GLYCERIN (LAXATIVE) 2.1 G RE SUPP
1.0000 | Freq: Once | RECTAL | Status: AC
  Administered 2014-10-14: 1 via RECTAL
  Filled 2014-10-14: qty 1

## 2014-10-14 MED ORDER — HYDROCORTISONE ACETATE 25 MG RE SUPP
25.0000 mg | Freq: Two times a day (BID) | RECTAL | Status: DC | PRN
Start: 2014-10-14 — End: 2014-10-17
  Administered 2014-10-14 – 2014-10-16 (×2): 25 mg via RECTAL
  Filled 2014-10-14 (×3): qty 1

## 2014-10-14 NOTE — Clinical Social Work Placement (Addendum)
Clinical Social Work Department CLINICAL SOCIAL WORK PLACEMENT NOTE 10/14/2014  Patient:  CONSUELLO, LASSALLE  Account Number:  [id number] Goodnight date:  10/10/2014  Clinical Social Worker:  Carrington Clamp, LCSWA  Date/time:  10/14/2014 05:51 PM  Clinical Social Work is seeking post-discharge placement for this patient at the following level of care:   SKILLED NURSING   (*CSW will update this form in Epic as items are completed)   10/14/2014  Patient/family provided with [facility] Department of Clinical Social Work's list of facilities offering this level of care within the geographic area requested by the patient (or if unable, by the patient's family).  10/14/2014  Patient/family informed of their freedom to choose among providers that offer the needed level of care, that participate in Medicare, Medicaid or managed care program needed by the patient, have an available bed and are willing to accept the patient.  10/14/2014  Patient/family informed of [facility]' ownership interest in [facility], as well as of the fact that they are under no obligation to receive care at this facility.  PASARR submitted to [employer] on Existing PASARR number received on Existing  FL2 transmitted to all facilities in geographic area requested by pt/family on  10/14/2014 FL2 transmitted to all facilities within larger geographic area on   Patient informed that his/her managed care company has contracts with or will negotiate with  certain facilities, including the following:     Patient/family informed of bed offers received:  10/14/2013 Lubertha Sayres, Latanya Presser) Patient chooses bed at [facility] Nonnie Done, [facility]) Physician recommends and patient chooses bed at  n/a  Patient to be transferred to  [facility] on 10/17/2014 Nonnie Done, [facility]) Patient to be transferred to facility by [facility] Nonnie Done, LCSWA) Patient and family notified of transfer on 10/17/2014 Nonnie Done,  Latanya Presser) Name of family member notified:  Timmothy Sours  The following physician request were entered in Epic:   Additional Comments:  Carrington Clamp, [facility] Weekend Clinical Social Worker [phone]  Lubertha Sayres, [place] [phone]) Licensed Clinical Social Worker Orthopedics [phone]) and Surgical [phone])

## 2014-10-14 NOTE — Progress Notes (Signed)
Dr. Theda Sers requested to continue hemovac and foley catheter until tomorrow morning 10/15/14.

## 2014-10-14 NOTE — Progress Notes (Signed)
OT Cancellation Note  Patient Details Name: [name] MRN: [id number] DOB: Jul 10, 1927   Cancelled Treatment:    Reason Eval/Treat Not Completed: OT screened. Pt is Medicare and current D/C plan is SNF. No apparent immediate acute care OT needs, therefore will defer OT to SNF. If OT eval is needed please call Acute Rehab Dept. at [phone] or text page OT at [phone].    Benito Mccreedy OTR/L [phone] 10/14/2014, 4:24 PM

## 2014-10-14 NOTE — Evaluation (Addendum)
Physical Therapy Evaluation Patient Details Name: [name] MRN: [id number] DOB: [birth year] Today's Date: 10/14/2014     Please Note: This note for the PT Eval performed on 3/13 was posted as a treatment note in error; the complete PT Evaluation note is now posted (file time approx 1026 on 3/15) Thank you,  Roney Marion, PT  Acute Rehabilitation Services Pager [phone] Office [phone]    History of Present Illness  HPI: KAYDYNCE PAT is a 81 y.o. female with a recent Right THA on 09/22/2014 who was discharged to the [facility] but she continued to have increased pain and was unable to bear weight on her rt leg and unable to participate with her physical therapy. At the facility an X-ray of the Right hip was performed, which revealed extension of the fracture above the hardware.  Clinical Impression  Patient is s/p above surgery resulting in functional limitations due to the deficits listed below (see PT Problem List).  Patient will benefit from skilled PT to increase their independence and safety with mobility to allow discharge to the venue listed below.       Follow Up Recommendations SNF;Supervision/Assistance - 24 hour    Equipment Recommendations  Rolling walker with 5" wheels    Recommendations for Other Services       Precautions / Restrictions Precautions Precautions: Posterior Hip;Fall Precaution Booklet Issued: Yes (comment) Precaution Comments: Reviewed hip precautions; pt recalled 2/3 Restrictions RLE Weight Bearing: Touchdown weight bearing      Mobility  Bed Mobility Overal bed mobility: Needs Assistance Bed Mobility: Supine to Sit     Supine to sit: +2 for safety/equipment;Mod assist     General bed mobility comments: Cues for technique; near constant support provided to RLE given pain level  Transfers Overall transfer level: Needs assistance Equipment used: Rolling walker (2 wheeled) Transfers: Sit to/from  Stand Sit to Stand: Mod assist         General transfer comment: vc's for hand placement, mod A for power up  Ambulation/Gait Ambulation/Gait assistance: +2 safety/equipment;Min assist Ambulation Distance (Feet):  (pivot steps bed to recliner) Assistive device: Rolling walker (2 wheeled)       General Gait Details: Cues to keep weight off of RLE; good job suporting self on RW while stepping LLE  Stairs            Wheelchair Mobility    Modified Rankin (Stroke Patients Only)       Balance     Sitting balance-Leahy Scale: Fair       Standing balance-Leahy Scale: Poor                               Pertinent Vitals/Pain Pain Assessment: 0-10 Pain Score: 8  Pain Location: R Hip Pain Descriptors / Indicators: Aching;Grimacing;Discomfort Pain Intervention(s): Limited activity within patient's tolerance;Monitored during session;Repositioned;Patient requesting pain meds-RN notified    Home Living                        Prior Function                 Hand Dominance        Extremity/Trunk Assessment                         Communication      Cognition Arousal/Alertness: Awake/alert Behavior During Therapy: WFL for tasks  assessed/performed Overall Cognitive Status: Within Functional Limits for tasks assessed                      General Comments      Exercises        Assessment/Plan    PT Assessment    PT Diagnosis     PT Problem List    PT Treatment Interventions     PT Goals (Current goals can be found in the Care Plan section) Acute Rehab PT Goals Patient Stated Goal: wants to feel better PT Goal Formulation: With patient Time For Goal Achievement: 10/28/14 Potential to Achieve Goals: Good    Frequency Min 3X/week   Barriers to discharge        Co-evaluation               End of Session Equipment Utilized During Treatment: Gait belt Activity Tolerance: Patient limited by  fatigue Patient left: in chair;with call bell/phone within reach Nurse Communication: Mobility status         Time: 1135-1212 PT Time Calculation (min) (ACUTE ONLY): 37 min   Charges:   PT Evaluation $Initial PT Evaluation Tier I: 1 Procedure PT Treatments $Therapeutic Activity: 8-22 mins   PT G Codes:        Quin Hoop 10/14/2014, 1:52 PM  Roney Marion, [place] Pager [phone] Office [phone]

## 2014-10-14 NOTE — Clinical Social Work Psychosocial (Signed)
Clinical Social Work Department BRIEF PSYCHOSOCIAL ASSESSMENT 10/14/2014  Patient:  [name], [name]     Account Number:  [id number]     Admit date:  10/10/2014  Clinical Social Worker:  Hubert Azure  Date/Time:  10/14/2014 05:53 PM  Referred by:  Physician  Date Referred:  10/14/2014 Referred for  SNF Placement   Other Referral:   Interview type:  Patient Other interview type:    PSYCHOSOCIAL DATA Living Status:  FACILITY Admitted from facility:  [facility] Level of care:  Ken Caryl Primary support name:   Primary support relationship to patient:   Degree of support available:    CURRENT CONCERNS Current Concerns  Post-Acute Placement   Other Concerns:    SOCIAL WORK ASSESSMENT / PLAN CSW met with patient who was alert and oriented. CSW introduced self and explained role. CSW discussed d/c plan with patient. Per patient, she was at [facility] and does not wish to return. Patient states preference for [facility]   Assessment/plan status:  Other - See comment Other assessment/ plan:   CSW to submit PASARR and complete FL2 for placement.   Information/referral to community resources:    PATIENT'S/FAMILY'S RESPONSE TO PLAN OF CARE: Patient is agreeable to SNF placement and prefers [facility]. Patient does not desire to return to [facility].    [place], [place] Weekend Clinical Social Worker [phone]

## 2014-10-14 NOTE — Progress Notes (Signed)
PROGRESS NOTE  PEREL HAUSCHILD LOV:[id number] DOB: [birth year] DOA: 10/10/2014 PCP: [name], MD  HPI: 81 y.o. female with a recent Right THA on 09/22/2014 who was discharged to the [facility] but she continued to have increased pain and was unable to bear weight on her rt leg and unable to participate with her physical therapy. At the facility an X-ray of the Right hip was performed, which revealed extension of the fracture above the hardware  Subjective / 24 H Interval events - Ongoing hip discomfort - States surgery was very long yesterday  Assessment/Plan: Principal Problem:   Periprosthetic fracture around internal prosthetic right hip joint Active Problems:   Hip fracture   COPD exacerbation   Acute cystitis without hematuria   Thyroid activity decreased   Protein-calorie malnutrition, severe   Peri-prosthetic fracture of femur following total hip arthroplasty  Acute fracture of the lesser trochanter of the proximal right femur  - orthopedic surgery consulted, patient for surgery 3/12 - PT evaluation today, will likely need placement   UTI - on Rocephin, cultures with 30,000 colonies, multiple bacterial morphotypes.  - Likely cultures negative since she has already been on antibiotics.  - plan for a 5 day course   Constipation - suppository today   COPD/bronchiectasis - mild hemoptysis 3/11, unclear quantity - Hb stable, no increased oxygen needs - closely monitor  Hypothyroidism - continue synthroid   Diet: Diet regular Fluids: none  DVT Prophylaxis: per ortho  Code Status: Full Code Family Communication: d/w patient   Disposition Plan: remain inpatient  Consultants:  Orthopedic surgery   Procedures:  None    Antibiotics Ceftriaxone 3/10 >>   Studies  Dg Hip Port Unilat With Pelvis 1v Right  10/13/2014   CLINICAL DATA:  Postop check  EXAM: RIGHT HIP (WITH PELVIS) 1 VIEW PORTABLE  COMPARISON:  10/10/2014   FINDINGS: Right hip arthroplasty device is identified. The femoral shaft appears secured by four cerclage wires. The hardware components are in anatomic alignment. No periprosthetic fracture or subluxation. Surgical drain overlies the proximal right femur. There is gas identified within the surrounding soft tissues as well as overlying skin staples.  IMPRESSION: 1. Status post right hip arthroplasty.  No complications.   Electronically Signed   By: Kerby Moors M.D.   On: 10/13/2014 15:52   Dg Hip Operative Unilat With Pelvis Right  10/13/2014   CLINICAL DATA:  Posterior RIGHT hip revision  EXAM: OPERATIVE RIGHT HIP (WITH PELVIS IF PERFORMED) 2 VIEWS  TECHNIQUE: Fluoroscopic spot image(s) were submitted for interpretation post-operatively.  COMPARISON:  10/10/2014  FLUOROSCOPY TIME:  FLUOROSCOPY TIME 1 min 49 seconds  Number images obtained:  2  FINDINGS: New RIGHT hip prosthesis identified.  Superior aspect of the femoral head component and acetabulum are not completely imaged.  4 cerclage wires present.  No fracture or bone destruction seen.  IMPRESSION: RIGHT hip prosthesis without acute fracture identified on study limited by incomplete visualization.   Electronically Signed   By: Lavonia Dana M.D.   On: 10/13/2014 15:39    Objective  Filed Vitals:   10/13/14 2317 10/14/14 0000 10/14/14 0100 10/14/14 0557  BP:   136/54 135/58  Pulse:   69 82  Temp:   97.6 F (36.4 C) 97.6 F (36.4 C)  TempSrc:   Oral Oral  Resp: 18 16 16 18   Height:      Weight:      SpO2: 98% 98% 99% 100%   Exam:  General:  NAD  HEENT: no scleral icterus  Cardiovascular: RRR   Respiratory: decreased breath sounds overall, no wheezing  Abdomen: soft, non tender  MSK/Extremities: no clubbing/cyanosis   Skin: no rashes  Neuro: non focal   Data Reviewed: Basic Metabolic Panel:  Recent Labs Lab 10/10/14 2211 10/11/14 0725 10/12/14 0457 10/14/14 0558  NA 135 136 138 139  K 4.6 3.9 4.1 3.8  CL 100 97  101 103  CO2 27 35* 30 31  GLUCOSE 136* 103* 104* 110*  BUN 14 10 8 8   CREATININE 0.76 0.65 0.69 0.71  CALCIUM 8.7 8.5 8.3* 8.0*   CBC:  Recent Labs Lab 10/10/14 2211 10/11/14 0725 10/12/14 0457  WBC 11.2* 8.5 7.5  NEUTROABS 9.2*  --   --   HGB 12.2 11.6* 11.0*  HCT 38.1 36.4 34.0*  MCV 95.5 98.4 96.6  PLT 259 251 223   CBG:  Recent Labs Lab 10/12/14 1626  GLUCAP 94   Recent Results (from the past 240 hour(s))  Culture, Urine     Status: None   Collection Time: 10/11/14  9:05 AM  Result Value Ref Range Status   Specimen Description URINE, CLEAN CATCH  Final   Special Requests NONE  Final   Colony Count   Final    >=100,000 COLONIES/ML Performed at [facility]    Culture   Final    ESCHERICHIA COLI Performed at [facility]    Report Status 10/13/2014 FINAL  Final   Organism ID, Bacteria ESCHERICHIA COLI  Final      Susceptibility   Escherichia coli - MIC*    AMPICILLIN >=32 RESISTANT Resistant     CEFAZOLIN >=64 RESISTANT Resistant     CEFTRIAXONE <=1 SENSITIVE Sensitive     CIPROFLOXACIN >=4 RESISTANT Resistant     GENTAMICIN <=1 SENSITIVE Sensitive     LEVOFLOXACIN >=8 RESISTANT Resistant     NITROFURANTOIN <=16 SENSITIVE Sensitive     TOBRAMYCIN <=1 SENSITIVE Sensitive     TRIMETH/SULFA >=320 RESISTANT Resistant     PIP/TAZO 64 INTERMEDIATE Intermediate     * ESCHERICHIA COLI  Urine culture     Status: None   Collection Time: 10/12/14  9:05 AM  Result Value Ref Range Status   Specimen Description URINE, RANDOM  Final   Special Requests NONE  Final   Colony Count   Final    30,000 COLONIES/ML Performed at [facility]    Culture   Final    Multiple bacterial morphotypes present, none predominant. Suggest appropriate recollection if clinically indicated. Performed at [facility]    Report Status 10/13/2014 FINAL  Final  Body fluid culture     Status: None (Preliminary result)   Collection Time: 10/13/14 11:50  AM  Result Value Ref Range Status   Specimen Description HIP RIGHT FLUID D  Final   Special Requests NONE  Final   Gram Stain   Final    FEW WBC PRESENT, PREDOMINANTLY PMN NO ORGANISMS SEEN Performed at [facility]    Culture PENDING  Incomplete   Report Status PENDING  Incomplete  Body fluid culture     Status: None (Preliminary result)   Collection Time: 10/13/14 11:53 AM  Result Value Ref Range Status   Specimen Description HIP RIGHT FLUID E  Final   Special Requests NONE  Final   Gram Stain   Final    RARE WBC PRESENT,BOTH PMN AND MONONUCLEAR NO ORGANISMS SEEN Performed at [facility]  Culture PENDING  Incomplete   Report Status PENDING  Incomplete  Body fluid culture     Status: None (Preliminary result)   Collection Time: 10/13/14 11:55 AM  Result Value Ref Range Status   Specimen Description HIP RIGHT FLUID F  Final   Special Requests NONE  Final   Gram Stain   Final    RARE WBC PRESENT, PREDOMINANTLY PMN NO ORGANISMS SEEN Performed at [facility]    Culture PENDING  Incomplete   Report Status PENDING  Incomplete     Scheduled Meds: . acetaminophen  1,000 mg Oral 3 times per day   Or  . acetaminophen  650 mg Rectal 3 times per day  . aspirin EC  325 mg Oral Q breakfast  . cefTRIAXone (ROCEPHIN)  IV  1 g Intravenous Daily  . feeding supplement (ENSURE COMPLETE)  237 mL Oral BID BM  . Glycerin (Adult)  1 suppository Rectal Once  . ketorolac  7.5 mg Intravenous 4 times per day  . levothyroxine  75 mcg Oral QAC breakfast  . senna-docusate  1 tablet Oral QHS   Continuous Infusions: . sodium chloride 75 mL/hr at 10/14/14 0400    Marzetta Board, MD [facility] Pager [phone]. If 7 PM - 7 AM, please contact night-coverage at [email or web address], password [facility] 10/14/2014, 8:02 AM  LOS: 3 days

## 2014-10-14 NOTE — Progress Notes (Signed)
Subjective: 1 Day Post-Op Procedure(s) (LRB): REVISION RIGHT TOTAL HIP POSTERIOR  (Right) Patient reports pain as 8 on 0-10 scale.   Patient and husband well known to me. Complains of generalized pain and fatigue. Husband in the room. Objective: Vital signs in last 24 hours: Temp:  [97.6 F (36.4 C)-98.8 F (37.1 C)] 97.6 F (36.4 C) (03/13 0557) Pulse Rate:  [69-97] 82 (03/13 0557) Resp:  [12-18] 18 (03/13 1006) BP: (118-168)/(52-94) 135/58 mmHg (03/13 0557) SpO2:  [96 %-100 %] 96 % (03/13 1006) Weight:  [61.689 kg (136 lb)] 61.689 kg (136 lb) (03/12 1738)  Intake/Output from previous day: 03/12 0701 - 03/13 0700 In: 4840 [P.O.:320; I.V.:3300; Blood:670; IV Piggyback:550] Out: 2975 [Urine:1950; Drains:225; Blood:800] Intake/Output this shift: Total I/O In: 146.3 [I.V.:96.3; IV Piggyback:50] Out: -    Recent Labs  10/12/14 0457 10/14/14 0558  HGB 11.0* 10.8*    Recent Labs  10/12/14 0457 10/14/14 0558  WBC 7.5 7.8  RBC 3.52* 3.60*  HCT 34.0* 32.8*  PLT 223 181    Recent Labs  10/12/14 0457 10/14/14 0558  NA 138 139  K 4.1 3.8  CL 101 103  CO2 30 31  BUN 8 8  CREATININE 0.69 0.71  GLUCOSE 104* 110*  CALCIUM 8.3* 8.0*   No results for input(s): LABPT, INR in the last 72 hours.  Incision: dressing C/D/I Calf ok  Moves foot and ankle well.   Assessment/Plan: 1 Day Post-Op Procedure(s) (LRB): REVISION RIGHT TOTAL HIP POSTERIOR  (Right) Up with therapy  hemovac 125cc in last 12 hours. Will leave till am. Will be slow to ambulate . Discussed with patient and husband.  Teng Decou ANDREW 10/14/2014, 11:01 AM

## 2014-10-15 LAB — CBC
HCT: 32.6 % — ABNORMAL LOW (ref 36.0–46.0)
Hemoglobin: 10.7 g/dL — ABNORMAL LOW (ref 12.0–15.0)
MCH: 30.1 pg (ref 26.0–34.0)
MCHC: 32.8 g/dL (ref 30.0–36.0)
MCV: 91.6 fL (ref 78.0–100.0)
Platelets: 165 10*3/uL (ref 150–400)
RBC: 3.56 MIL/uL — ABNORMAL LOW (ref 3.87–5.11)
RDW: 15.7 % — ABNORMAL HIGH (ref 11.5–15.5)
WBC: 8.8 10*3/uL (ref 4.0–10.5)

## 2014-10-15 LAB — BASIC METABOLIC PANEL
Anion gap: 6 (ref 5–15)
BUN: 9 mg/dL (ref 6–23)
CHLORIDE: 102 mmol/L (ref 96–112)
CO2: 31 mmol/L (ref 19–32)
CREATININE: 0.63 mg/dL (ref 0.50–1.10)
Calcium: 8.2 mg/dL — ABNORMAL LOW (ref 8.4–10.5)
GFR, EST NON AFRICAN AMERICAN: 80 mL/min — AB (ref >90)
Glucose, Bld: 101 mg/dL — ABNORMAL HIGH (ref 70–99)
POTASSIUM: 3.7 mmol/L (ref 3.5–5.1)
Sodium: 139 mmol/L (ref 135–145)

## 2014-10-15 MED ORDER — ENOXAPARIN SODIUM 40 MG/0.4ML ~~LOC~~ SOLN
40.0000 mg | SUBCUTANEOUS | Status: DC
  Administered 2014-10-15 – 2014-10-17 (×3): 40 mg via SUBCUTANEOUS
  Filled 2014-10-15 (×3): qty 0.4

## 2014-10-15 MED ORDER — POLYETHYLENE GLYCOL 3350 17 G PO PACK
17.0000 g | PACK | Freq: Two times a day (BID) | ORAL | Status: DC
  Administered 2014-10-15 – 2014-10-17 (×5): 17 g via ORAL
  Filled 2014-10-15 (×5): qty 1

## 2014-10-15 MED ORDER — ENOXAPARIN SODIUM 40 MG/0.4ML ~~LOC~~ SOLN: 40.0000 mg | SUBCUTANEOUS | Status: DC

## 2014-10-15 MED ORDER — ACETAMINOPHEN 500 MG PO TABS: 1000.0000 mg | ORAL_TABLET | Freq: Three times a day (TID) | ORAL | Status: DC

## 2014-10-15 MED ORDER — TRAMADOL HCL 50 MG PO TABS: 50.0000 mg | ORAL_TABLET | Freq: Four times a day (QID) | ORAL | Status: DC | PRN

## 2014-10-15 NOTE — Progress Notes (Signed)
   Subjective:  Patient reports pain as mild to moderate.  No c/o currently. Denies N/V/CP/SOB.  Objective:   VITALS:   Filed Vitals:   10/14/14 2008 10/14/14 2336 10/15/14 0400 10/15/14 0656  BP: 112/50   158/55  Pulse: 80   81  Temp: 98.3 F (36.8 C)   98.3 F (36.8 C)  TempSrc: Oral   Oral  Resp: 16 16 16 16   Height:      Weight:      SpO2: 97% 97% 97% 100%    ABD soft Sensation intact distally Intact pulses distally Dorsiflexion/Plantar flexion intact Incision: dressing C/D/I HV scant ss  Lab Results  Component Value Date   WBC 8.8 10/15/2014   HGB 10.7* 10/15/2014   HCT 32.6* 10/15/2014   MCV 91.6 10/15/2014   PLT 165 10/15/2014   BMET    Component Value Date/Time   NA 139 10/15/2014 0604   K 3.7 10/15/2014 0604   CL 102 10/15/2014 0604   CO2 31 10/15/2014 0604   GLUCOSE 101* 10/15/2014 0604   BUN 9 10/15/2014 0604   CREATININE 0.63 10/15/2014 0604   CALCIUM 8.2* 10/15/2014 0604   GFRNONAA 80* 10/15/2014 0604   GFRAA >90 10/15/2014 0604     Assessment/Plan: 2 Days Post-Op   Principal Problem:   Periprosthetic fracture around internal prosthetic right hip joint Active Problems:   Hip fracture   COPD exacerbation   Acute cystitis without hematuria   Thyroid activity decreased   Protein-calorie malnutrition, severe   Peri-prosthetic fracture of femur following total hip arthroplasty  TDWB RLE with walker PO pain control DVT ppx: no further hemoptysis- will start lovenox, SCDs, TEDs intraop cx NGTD PT/OT Discharge to SNF    Ames Hoban, Horald Pollen 10/15/2014, 8:21 AM   Rod Can, MD Cell [phone]

## 2014-10-15 NOTE — Progress Notes (Signed)
Physical Therapy Treatment Patient Details Name: [name] MRN: [id number] DOB: [birth year] Today's Date: 10/15/2014    History of Present Illness HPI: [name] is a 81 y.o. female with a recent Right THA on 09/22/2014 who was discharged to the [facility] but she continued to have increased pain and was unable to bear weight on her rt leg and unable to participate with her physical therapy. At the facility an X-ray of the Right hip was performed, which revealed extension of the fracture above the hardware.    PT Comments    Making progress with mobility and activity tolerance; Overall good maintenance of TWB RLE, though fatigues quickly; Overall progressing well; Anticipate continuing good progress at post-acute rehabilitation.   Follow Up Recommendations  SNF;Supervision/Assistance - 24 hour     Equipment Recommendations  Rolling walker with 5" wheels    Recommendations for Other Services       Precautions / Restrictions Precautions Precautions: Posterior Hip;Fall Precaution Booklet Issued: Yes (comment) Precaution Comments: Reviewed hip precautions; pt recalled 2/3 Restrictions Weight Bearing Restrictions: Yes RLE Weight Bearing: Touchdown weight bearing    Mobility  Bed Mobility                  Transfers Overall transfer level: Needs assistance Equipment used: Rolling walker (2 wheeled) Transfers: Sit to/from Stand Sit to Stand: Mod assist         General transfer comment: vc's for hand placement, mod A for power up, good maintenance of TWB  Ambulation/Gait Ambulation/Gait assistance: Min assist;+2 safety/equipment (chair behind) Ambulation Distance (Feet): 6 Feet Assistive device: Rolling walker (2 wheeled) Gait Pattern/deviations: Step-to pattern Gait velocity: very slow   General Gait Details: Step-by-step cues for gait sequence; Cues to keep weight off of RLE; good job suporting self on RW while stepping  LLE   Stairs            Wheelchair Mobility    Modified Rankin (Stroke Patients Only)       Balance     Sitting balance-Leahy Scale: Fair       Standing balance-Leahy Scale: Poor                      Cognition Arousal/Alertness: Awake/alert Behavior During Therapy: WFL for tasks assessed/performed Overall Cognitive Status: Within Functional Limits for tasks assessed       Memory: Decreased recall of precautions              Exercises      General Comments        Pertinent Vitals/Pain Pain Assessment: Faces Faces Pain Scale: Hurts even more Pain Location: R hip with movement Pain Descriptors / Indicators: Aching;Grimacing Pain Intervention(s): Limited activity within patient's tolerance;Monitored during session;Repositioned    Home Living                      Prior Function            PT Goals (current goals can now be found in the care plan section) Acute Rehab PT Goals Patient Stated Goal: wants to feel better PT Goal Formulation: With patient Time For Goal Achievement: 10/28/14 Potential to Achieve Goals: Good Progress towards PT goals: Progressing toward goals    Frequency  Min 3X/week    PT Plan Current plan remains appropriate    Co-evaluation             End of Session Equipment Utilized During Treatment:  Gait belt Activity Tolerance: Patient limited by fatigue Patient left: in chair;with call bell/phone within reach;with nursing/sitter in room     Time: 0840-0905 PT Time Calculation (min) (ACUTE ONLY): 25 min  Charges:  $Gait Training: 8-22 mins $Therapeutic Activity: 8-22 mins                    G Codes:      [name] 10/15/2014, 10:43 AM  [name], [place] Pager [phone] Office [phone]

## 2014-10-15 NOTE — Op Note (Signed)
[name], [name] NO.:  [id number]  MEDICAL RECORD NO.:  [id number]  LOCATION:                                 FACILITY:  PHYSICIAN:  Rod Can, MD     DATE OF BIRTH:  02/06/1927  DATE OF PROCEDURE:  10/13/2014 DATE OF DISCHARGE:                              OPERATIVE REPORT   SURGEON:  Rod Can, MD  ASSISTANTS:  None.  PREOPERATIVE DIAGNOSIS:  Periprosthetic right femur fracture.  POSTOPERATIVE DIAGNOSIS:  Periprosthetic right femur fracture.  PROCEDURE PERFORMED:  Revision of femoral component, right hip  hemiarthroplasty and open reduction internal fixation of periprosthetic femur fracture.   EXPLANTS: 1. DePuy [place] Basic fracture stem, size 5. 2. A 48 mm fracture head ball with +5 mm spacer.  IMPLANTS: 1. DePuy AML femoral stem, size 13.5 mm small stature. 2. A 48 mm fracture head ball with +5 mm taper. 3. 1.8 mm Adult reconstruction cable Zimmer x4.  EBL:  800 mL.  ANTIBIOTICS:  2 g Ancef.  COMPLICATIONS:  None.  DISPOSITION:  Stable to PACU.  ANESTHESIA:  Spinal.  SPECIMENS:  Right hip fluid culture x3.  INDICATIONS:  The patient is an 81 year old female who sustained a ground level fall about 3 weeks ago.  She was treated by Dr. Paralee Day with a right hip hemiarthroplasty.  The patient was discharged to a skilled nursing facility.  She had been taking Xarelto for DVT prophylaxis.  The patient was having continued pain with ambulation.  He actually saw her in the office last week and radiographs were normal. She had increasing pain over the last few days and was brought to the emergency department for repeat evaluation.  X-rays revealed a periprosthetic fracture extending from the calcar down a few cm below the lesser trochanter.  This fracture fragment was displaced.  She was admitted to the Hospitalist Service and underwent perioperative risk stratification and medical optimization.  We allowed 72 hours to  elapse from her previous Xarelto dose, so she could receive a spinal per her choice. Risks, benefits, and alternatives to revision of her femoral component were explained and she elected to proceed.  DESCRIPTION OF PROCEDURE IN DETAIL:  The patient was correctly identified in the preop holding area using 2 patient identifiers. Surgical site was marked by myself.  The patient was taken to the operating room and spinal anesthesia was induced.  She was then flipped in the lateral decubitus position.  Axillary roll was placed and patient was secured to bed with a hip positioner.  The right hip was prepped and draped in a normal sterile surgical fashion.  Time-out was called verifying side and site of surgery.  She did receive IV antibiotics within 60 minutes beginning the procedure.  I began by localizing her previous incision.  I sharply excised her previous scar and I carried her incision a few cm more distally.  Blunt dissection was carried towards the IT band.  There was a fluid collection that communicated through a rent in the fascia with the hip joint.  I sent 3 representative samples of this fluid for culture.  There was no pus or evidence of infection. I  debrided all the sutures.  I incised the IT band using the previous incision and I did carry the incision distally.  Her sciatic nerve was identified by palpation and protected throughout the case.  I identified her posterior capsule and I incised her posterior capsule.  The hip was then dislocated.  The stem was noted to be grossly loose.  I used a bone tamp to remove the head from the taper.  I then applied a stem extractor and gently backslapped the stem out.  She had a fracture involving the calcar that extended few cm distal to the lesser trochanter.  In order to more easily mobilize the femur, I took down the gluteus maximus tendon leaving a cuff relator repair.  I used a cable passer and I passed 1 adult reconstruction cable  distally to the fracture as a prophylactic cable.  I then manually reduced the fracture and this was held in place with a pointed clamp.  I placed 2 additional Adult reconstruction cables distal to the lesser trochanter over the fracture. I checked the fracture reduction on biplanar fluoroscopy views.  She did have a near anatomic reduction and cable positioning was appropriate.  I did identify a small circular radiopaque foreign body within the soft tissues medial to the femur bone.  As soon as I saw this, I looked at her cables that I had already passed and one of the interference screws was in fact missing and likely fell off upon passing the cable and into the soft tissues of the thigh.  At this point, I proceeded with placing an additional cable proximal to the lesser trochanter taking care to stay underneath the iliopsoas tendon.  I then sequentially reamed up to a size 13 mm reamer and had excellent chatter in the bone.  I then selected a 13.5 mm small stature trial which I impacted into place.  I placed a 48 +5 head and reduced the hip.  I tightened all of the screws for the cabling system and then the wires were crimped. For the cable with the missing screw, we opened an additional cable and used the screw from that cabling system.  I checked AP and lateral fluoroscopy views confirming good fit of the femoral component and continued reduction of the fracture.  Stability testing was performed and the hip was stable. I then dislocated the hip and I removed the trial implants.  I then inserted the real AML stem in the real head ball and spacer.  The hip was reduced and stability testing was repeated and was extremely stable. I then localized the cabling system screw within her deep tissues, I used a tonsil to attempt to move the screw, but I was unsuccessful in doing so.  I did not want to risk damaging her neurovascular structures and thus doing more harm than just leaving the  implant in place.  I then copiously irrigated the wound with sterile saline with pulse lavage.  The posterior capsule was repaired with #1 Vicryl Sutures. The gluteus maximus tendon was repaired with #1 Vicryl suture. I closed the IT band over a medium  Hemovac drain using #1 Vicryl and V-Loc suture.  I closed the deep fat with  #0 interrupted Vicryl sutures; the more superficial fat with 2-0 interrupted  Vicryl sutures followed by 2-0 Monocryl for the deep dermal layer.  The skin  was reapproximated with staples and then I applied Dermabond.  Once the glue  was fully hardened, a Silverlon Ag dressing was  applied and a separate dressing  for the drain was placed.  Sponge, needle, instrument counts were correct at the end of the case x2.  There were no known complications.  I discussed the operative events and findings with the patient's fiancee.  He understands that we will readmit her to the hospital.  She will be touchdown weightbearing on the right lower extremity for approximately 6 weeks.  We will do aspirin for DVT prophylaxis due to the risk of bleeding.  We will plan to discontinue the drain tomorrow. She will receive IV Ancef for 24 hours.  We will follow her intraop cultures, but I do not expect them to be positive.  We discussed the loose cabling screw with in soft tissue; he understands that the possibility of her being harmed is essentially zero. He understands that I need to see her in the office in 2 weeks for a wound check.  All questions were solicited and answered to their satisfaction.          ______________________________ Rod Can, MD     BS/MEDQ  D:  10/13/2014  T:  10/13/2014  Job:  [id number]

## 2014-10-15 NOTE — Discharge Instructions (Signed)
Dr. Rod Can Joint Replacement Specialist [facility] [address], [place], [place] [address] [phone]                                                                                       POSTOPERATIVE DIRECTIONS    Hip Rehabilitation, Guidelines Following Surgery  The results of a hip operation are greatly improved after range of motion and muscle strengthening exercises. Follow all safety measures which are given to protect your hip. If any of these exercises cause increased pain or swelling in your joint, decrease the amount until you are comfortable again. Then slowly increase the exercises. Call your caregiver if you have problems or questions.  HOME CARE INSTRUCTIONS  Most of the following instructions are designed to prevent the dislocation of your new hip.  Remove items at home which could result in a fall. This includes throw rugs or furniture in walking pathways.  Continue medications as instructed at time of discharge.  You may have some home medications which will be placed on hold until you complete the course of blood thinner medication.  You may start showering once you are discharged home. Do not remove your dressing. No not soak or submerge dressing. Do not put on socks or shoes without following the instructions of your caregivers.   Sit on chairs with arms. Use the chair arms to help push yourself up when arising.  Arrange for the use of a toilet seat elevator so you are not sitting low.   Walk with walker as instructed.  You may resume a sexual relationship in one month or when given the OK by your caregiver.  Use walker as long as suggested by your caregivers.  Touch down weight bearing right leg Avoid periods of inactivity such as sitting longer than an hour when not asleep. This helps prevent blood clots.  Wear elastic stockings for two weeks following surgery during the day but you may remove then at night.  Make sure you  keep all of your appointments after your operation with all of your doctors and caregivers. You should call the office at the above phone number and make an appointment for approximately two weeks after the date of your surgery. Please pick up a stool softener and laxative for home use as long as you are requiring pain medications.  ICE to the affected hip every three hours for 30 minutes at a time and then as needed for pain and swelling. Continue to use ice on the hip for pain and swelling from surgery. You may notice swelling that will progress down to the foot and ankle.  This is normal after surgery.  Elevate the leg when you are not up walking on it.   It is important for you to complete the blood thinner medication as prescribed by your doctor.  Continue to use the breathing machine which will help keep your temperature down.  It is common for your temperature to cycle up and down following surgery, especially at night when you are not up moving around and exerting yourself.  The breathing machine keeps your lungs expanded and your temperature down.  RANGE OF MOTION AND STRENGTHENING EXERCISES  These exercises are designed to help you keep full movement of your hip joint. Follow your caregiver's or physical therapist's instructions. Perform all exercises about fifteen times, three times per day or as directed. Exercise both hips, even if you have had only one joint replacement. These exercises can be done on a training (exercise) mat, on the floor, on a table or on a bed. Use whatever works the best and is most comfortable for you. Use music or television while you are exercising so that the exercises are a pleasant break in your day. This will make your life better with the exercises acting as a break in routine you can look forward to.  Lying on your back, slowly slide your foot toward your buttocks, raising your knee up off the floor. Then slowly slide your foot back down until your leg is  straight again.  Lying on your back spread your legs as far apart as you can without causing discomfort.  Lying on your side, raise your upper leg and foot straight up from the floor as far as is comfortable. Slowly lower the leg and repeat.  Lying on your back, tighten up the muscle in the front of your thigh (quadriceps muscles). You can do this by keeping your leg straight and trying to raise your heel off the floor. This helps strengthen the largest muscle supporting your knee.  Lying on your back, tighten up the muscles of your buttocks both with the legs straight and with the knee bent at a comfortable angle while keeping your heel on the floor.   SKILLED REHAB INSTRUCTIONS: If the patient is transferred to a skilled rehab facility following release from the hospital, a list of the current medications will be sent to the facility for the patient to continue.  When discharged from the skilled rehab facility, please have the facility set up the patient's [place] prior to being released. Also, the skilled facility will be responsible for providing the patient with their medications at time of release from the facility to include their pain medication and their blood thinner medication. If the patient is still at the rehab facility at time of the two week follow up appointment, the skilled rehab facility will also need to assist the patient in arranging follow up appointment in our office and any transportation needs.  MAKE SURE YOU:  Understand these instructions.  Will watch your condition.  Will get help right away if you are not doing well or get worse.  Touch down weight bearing right leg. Pick up stool softner and laxative for home use following surgery while on pain medications. Do not remove your dressing. The dressing is waterproof--it is OK to take showers; no soaking or tub baths. Continue to use ice for pain and swelling after surgery. Do not use any lotions or  creams on the incision until instructed by your surgeon. Total Hip Protocol.

## 2014-10-15 NOTE — Progress Notes (Signed)
Thank you for consult on [name]. Chart reviewed and PT evaluation noted from this weekend. Patient lives alone and is limited by TDWB status on RLE. Concur with recommendations for SNF for therapy past discharge.

## 2014-10-15 NOTE — Progress Notes (Addendum)
Patient ID: [name], female   DOB: 05/31/1927, 81 y.o.   MRN: [id number]                 PROGRESS NOTE  DATE:  10/10/2014               FACILITY: [facility]                      LEVEL OF CARE:   SNF   Acute Visit                                     CHIEF COMPLAINT:  Continued right hip pain, ?UTI.    HISTORY OF PRESENT ILLNESS:  This is an 81 year-old woman who had a right hip hemiarthroplasty after falling and breaking her hip three weeks ago.  She continues to complain of increasing pain in the right hip.   There were initial reports of drainage when she first came.  A culture of this was negative.  The last time I saw this, there was no swelling and this was one week ago.     Also, she has vague suprapubic and back pain.  A culture was done that showed E.coli.       PHYSICAL EXAMINATION:   GENERAL APPEARANCE:  The patient is not in any distress, although seems to be in a lot of pain when the right hip is moved.   GASTROINTESTINAL:   LIVER/SPLEEN/KIDNEYS:  No liver, no spleen.  No tenderness.   GENITOURINARY:   BLADDER:  There is no suprapubic fullness, tenderness, or costovertebral angle tenderness.   SKIN:   INSPECTION:  Right hip:  The incision is well opposed.  There is no evidence of infection here.  However, she appears to have a fluid collection over the surgical area.   There is some old bruising inferiorly.  I suspect this may be a hematoma or a seroma.    ASSESSMENT/PLAN:                                       E.coli UTI.  I am going to start her on Nitrofurantoin.  This has multiple antibiotic resistances including advanced generation penicillins, first generation cephalosporins, and quinolones.    ?Hematoma or seroma over the right hip.  She continues to complain of pain.  There is a swelling here which feels like fluid.  The bruising on the inferior aspect of this would make me think that this is probably a hematoma.  This has occurred in the last week.  She  is on Xarelto for DVT prophylaxis.  I am going to put this on hold.  She will need to follow up with her surgeon.  I would like follow-up x-rays in case we are overlooking something here.

## 2014-10-15 NOTE — Progress Notes (Signed)
PROGRESS NOTE  [name] AYT:[id number] DOB: [birth year] DOA: 10/10/2014 PCP: [name], MD  HPI: 81 y.o. female with a recent Right THA on 09/22/2014 who was discharged to the [facility] but she continued to have increased pain and was unable to bear weight on her rt leg and unable to participate with her physical therapy. At the facility an X-ray of the Right hip was performed, which revealed extension of the fracture above the hardware  Subjective / 24 H Interval events - Ongoing hip discomfort  Assessment/Plan: Principal Problem:   Periprosthetic fracture around internal prosthetic right hip joint Active Problems:   Hip fracture   COPD exacerbation   Acute cystitis without hematuria   Thyroid activity decreased   Protein-calorie malnutrition, severe   Peri-prosthetic fracture of femur following total hip arthroplasty   Acute fracture of the lesser trochanter of the proximal right femur  - orthopedic surgery consulted, patient underwent surgery 3/12 - PT recommended SNF - will ask inpatient rehab to evaluate as well   ABLA - s/p 1U pRBC on 3/12, stable Hb since  UTI - on Rocephin, cultures with 30,000 colonies, multiple bacterial morphotypes.  - Likely cultures negative since she has already been on antibiotics.  - plan for a 5 day course, stop antibiotics today   Constipation - bowel regimen  COPD/bronchiectasis - mild hemoptysis 3/11, unclear quantity - Hb stable, no increased oxygen needs - closely monitor  Hypothyroidism - continue synthroid   Diet: Diet regular Fluids: none  DVT Prophylaxis: per ortho  Code Status: Full Code Family Communication: d/w patient   Disposition Plan: remain inpatient, SNF vs CIR when ready   Consultants:  Orthopedic surgery   Procedures:  None    Antibiotics Ceftriaxone 3/10 >> 3/14   Studies  Dg Hip Port Unilat With Pelvis 1v Right  10/13/2014   CLINICAL DATA:  Postop  check  EXAM: RIGHT HIP (WITH PELVIS) 1 VIEW PORTABLE  COMPARISON:  10/10/2014  FINDINGS: Right hip arthroplasty device is identified. The femoral shaft appears secured by four cerclage wires. The hardware components are in anatomic alignment. No periprosthetic fracture or subluxation. Surgical drain overlies the proximal right femur. There is gas identified within the surrounding soft tissues as well as overlying skin staples.  IMPRESSION: 1. Status post right hip arthroplasty.  No complications.   Electronically Signed   By: Kerby Moors M.D.   On: 10/13/2014 15:52   Dg Hip Operative Unilat With Pelvis Right  10/13/2014   CLINICAL DATA:  Posterior RIGHT hip revision  EXAM: OPERATIVE RIGHT HIP (WITH PELVIS IF PERFORMED) 2 VIEWS  TECHNIQUE: Fluoroscopic spot image(s) were submitted for interpretation post-operatively.  COMPARISON:  10/10/2014  FLUOROSCOPY TIME:  FLUOROSCOPY TIME 1 min 49 seconds  Number images obtained:  2  FINDINGS: New RIGHT hip prosthesis identified.  Superior aspect of the femoral head component and acetabulum are not completely imaged.  4 cerclage wires present.  No fracture or bone destruction seen.  IMPRESSION: RIGHT hip prosthesis without acute fracture identified on study limited by incomplete visualization.   Electronically Signed   By: Lavonia Dana M.D.   On: 10/13/2014 15:39   Objective  Filed Vitals:   10/14/14 2008 10/14/14 2336 10/15/14 0400 10/15/14 0656  BP: 112/50   158/55  Pulse: 80   81  Temp: 98.3 F (36.8 C)   98.3 F (36.8 C)  TempSrc: Oral   Oral  Resp: 16 16 16 16   Height:  Weight:      SpO2: 97% 97% 97% 100%   Exam:  General:  NAD  HEENT: no scleral icterus  Cardiovascular: RRR   Respiratory: decreased breath sounds overall, no wheezing  Abdomen: soft, non tender  MSK/Extremities: no clubbing/cyanosis   Skin: no rashes  Neuro: non focal   Data Reviewed: Basic Metabolic Panel:  Recent Labs Lab 10/10/14 2211 10/11/14 0725  10/12/14 0457 10/13/14 1138 10/13/14 1257 10/13/14 1334 10/14/14 0558  NA 135 136 138 138 138 139 139  K 4.6 3.9 4.1 3.9 3.7 3.6 3.8  CL 100 97 101  --   --   --  103  CO2 27 35* 30  --   --   --  31  GLUCOSE 136* 103* 104* 110* 107* 98 110*  BUN 14 10 8   --   --   --  8  CREATININE 0.76 0.65 0.69  --   --   --  0.71  CALCIUM 8.7 8.5 8.3*  --   --   --  8.0*   CBC:  Recent Labs Lab 10/10/14 2211 10/11/14 0725 10/12/14 0457 10/13/14 1138 10/13/14 1257 10/13/14 1334 10/14/14 0558  WBC 11.2* 8.5 7.5  --   --   --  7.8  NEUTROABS 9.2*  --   --   --   --   --   --   HGB 12.2 11.6* 11.0* 10.2* 9.2* 8.2* 10.8*  HCT 38.1 36.4 34.0* 30.0* 27.0* 24.0* 32.8*  MCV 95.5 98.4 96.6  --   --   --  91.1  PLT 259 251 223  --   --   --  181   CBG:  Recent Labs Lab 10/12/14 1626  GLUCAP 94   Recent Results (from the past 240 hour(s))  Culture, Urine     Status: None   Collection Time: 10/11/14  9:05 AM  Result Value Ref Range Status   Specimen Description URINE, CLEAN CATCH  Final   Special Requests NONE  Final   Colony Count   Final    >=100,000 COLONIES/ML Performed at [facility]    Culture   Final    ESCHERICHIA COLI Performed at [facility]    Report Status 10/13/2014 FINAL  Final   Organism ID, Bacteria ESCHERICHIA COLI  Final      Susceptibility   Escherichia coli - MIC*    AMPICILLIN >=32 RESISTANT Resistant     CEFAZOLIN >=64 RESISTANT Resistant     CEFTRIAXONE <=1 SENSITIVE Sensitive     CIPROFLOXACIN >=4 RESISTANT Resistant     GENTAMICIN <=1 SENSITIVE Sensitive     LEVOFLOXACIN >=8 RESISTANT Resistant     NITROFURANTOIN <=16 SENSITIVE Sensitive     TOBRAMYCIN <=1 SENSITIVE Sensitive     TRIMETH/SULFA >=320 RESISTANT Resistant     PIP/TAZO 64 INTERMEDIATE Intermediate     * ESCHERICHIA COLI  Urine culture     Status: None   Collection Time: 10/12/14  9:05 AM  Result Value Ref Range Status   Specimen Description URINE, RANDOM  Final    Special Requests NONE  Final   Colony Count   Final    30,000 COLONIES/ML Performed at [facility]    Culture   Final    Multiple bacterial morphotypes present, none predominant. Suggest appropriate recollection if clinically indicated. Performed at [facility]    Report Status 10/13/2014 FINAL  Final  Anaerobic culture     Status: None (Preliminary result)  Collection Time: 10/13/14 11:33 AM  Result Value Ref Range Status   Specimen Description HIP RIGHT FLUID  Final   Special Requests NONE  Final   Gram Stain PENDING  Incomplete   Culture   Final    NO ANAEROBES ISOLATED; CULTURE IN PROGRESS FOR 5 DAYS Performed at [facility]    Report Status PENDING  Incomplete  Anaerobic culture     Status: None (Preliminary result)   Collection Time: 10/13/14 11:36 AM  Result Value Ref Range Status   Specimen Description HIP RIGHT FLUID B  Final   Special Requests NONE  Final   Gram Stain PENDING  Incomplete   Culture   Final    NO ANAEROBES ISOLATED; CULTURE IN PROGRESS FOR 5 DAYS Performed at [facility]    Report Status PENDING  Incomplete  Anaerobic culture     Status: None (Preliminary result)   Collection Time: 10/13/14 11:47 AM  Result Value Ref Range Status   Specimen Description HIP RIGHT FLUID C  Final   Special Requests NONE  Final   Gram Stain PENDING  Incomplete   Culture   Final    NO ANAEROBES ISOLATED; CULTURE IN PROGRESS FOR 5 DAYS Performed at [facility]    Report Status PENDING  Incomplete  Body fluid culture     Status: None (Preliminary result)   Collection Time: 10/13/14 11:50 AM  Result Value Ref Range Status   Specimen Description HIP RIGHT FLUID D  Final   Special Requests NONE  Final   Gram Stain   Final    FEW WBC PRESENT, PREDOMINANTLY PMN NO ORGANISMS SEEN Performed at [facility]    Culture NO GROWTH Performed at [facility]   Final   Report Status PENDING  Incomplete  Body  fluid culture     Status: None (Preliminary result)   Collection Time: 10/13/14 11:53 AM  Result Value Ref Range Status   Specimen Description HIP RIGHT FLUID E  Final   Special Requests NONE  Final   Gram Stain   Final    RARE WBC PRESENT,BOTH PMN AND MONONUCLEAR NO ORGANISMS SEEN Performed at [facility]    Culture NO GROWTH Performed at [facility]   Final   Report Status PENDING  Incomplete  Body fluid culture     Status: None (Preliminary result)   Collection Time: 10/13/14 11:55 AM  Result Value Ref Range Status   Specimen Description HIP RIGHT FLUID F  Final   Special Requests NONE  Final   Gram Stain   Final    RARE WBC PRESENT, PREDOMINANTLY PMN NO ORGANISMS SEEN Performed at [facility]    Culture NO GROWTH Performed at [facility]   Final   Report Status PENDING  Incomplete     Scheduled Meds: . acetaminophen  1,000 mg Oral 3 times per day   Or  . acetaminophen  650 mg Rectal 3 times per day  . aspirin EC  325 mg Oral Q breakfast  . cefTRIAXone (ROCEPHIN)  IV  1 g Intravenous Daily  . feeding supplement (ENSURE COMPLETE)  237 mL Oral BID BM  . levothyroxine  75 mcg Oral QAC breakfast  . senna-docusate  1 tablet Oral QHS   Continuous Infusions: . sodium chloride 10 mL/hr at 10/14/14 0817    Marzetta Board, MD [facility] Pager [phone]. If 7 PM - 7 AM, please contact night-coverage at [email or web address], password [facility] 10/15/2014, 7:02  AM  LOS: 4 days

## 2014-10-16 ENCOUNTER — Inpatient Hospital Stay (HOSPITAL_COMMUNITY): Payer: Medicare Other

## 2014-10-16 DIAGNOSIS — T84048D Periprosthetic fracture around other internal prosthetic joint, subsequent encounter: Secondary | ICD-10-CM

## 2014-10-16 DIAGNOSIS — K5900 Constipation, unspecified: Secondary | ICD-10-CM

## 2014-10-16 LAB — CBC
HEMATOCRIT: 34.6 % — AB (ref 36.0–46.0)
Hemoglobin: 11.2 g/dL — ABNORMAL LOW (ref 12.0–15.0)
MCH: 29.9 pg (ref 26.0–34.0)
MCHC: 32.4 g/dL (ref 30.0–36.0)
MCV: 92.5 fL (ref 78.0–100.0)
Platelets: 214 10*3/uL (ref 150–400)
RBC: 3.74 MIL/uL — ABNORMAL LOW (ref 3.87–5.11)
RDW: 15.4 % (ref 11.5–15.5)
WBC: 8.4 10*3/uL (ref 4.0–10.5)

## 2014-10-16 LAB — GLUCOSE, CAPILLARY: GLUCOSE-CAPILLARY: 85 mg/dL (ref 70–99)

## 2014-10-16 MED ORDER — GLYCERIN (LAXATIVE) 2.1 G RE SUPP
1.0000 | Freq: Once | RECTAL | Status: AC
  Administered 2014-10-16: 1 via RECTAL
  Filled 2014-10-16: qty 1

## 2014-10-16 NOTE — Progress Notes (Signed)
Physical Therapy Treatment Patient Details Name: [name] MRN: [id number] DOB: [birth year] Today's Date: 10/16/2014    History of Present Illness HPI: [name] is a 81 y.o. female with a recent Right THA on 09/22/2014 who was discharged to the [facility] but she continued to have increased pain and was unable to bear weight on her rt leg and unable to participate with her physical therapy. At the facility an X-ray of the Right hip was performed, which revealed extension of the fracture above the hardware.s/p R THA revision on 3/12    PT Comments    Making progress with mobility and activity tolerance with significantly improved AROM and AAROM R hip; Pt very much wanting to consider dc to CIR; This is reasonable, given her progress and good participation in PT sessions since 3/13, and her age perhaps qualifying her for medical necessity, I believe it is worth giving a second look at [facility] for rehab; Have placed screen, and called Pamala Hurry, [place] Admissions Coord, and left a message to help clarify recent PT notes   Follow Up Recommendations  CIR;Supervision/Assistance - 24 hour     Equipment Recommendations  Rolling walker with 5" wheels;3in1 (PT)    Recommendations for Other Services Rehab consult;OT consult     Precautions / Restrictions Precautions Precautions: Posterior Hip;Fall Precaution Booklet Issued: Yes (comment) Precaution Comments: Reviewed hip precautions; pt recalled 2/3 (Needs reinforcement for no hip flexion past 90 deg) Restrictions RLE Weight Bearing: Touchdown weight bearing RLE Partial Weight Bearing Percentage or Pounds: pt reeducated on this as well    Mobility  Bed Mobility Overal bed mobility: Needs Assistance Bed Mobility: Supine to Sit     Supine to sit: Mod assist     General bed mobility comments: Cues for technqiue, with noted improvement in ability to half-bridge to EOB with LLE; mod assist to elevate  trunk  Transfers Overall transfer level: Needs assistance Equipment used: Rolling walker (2 wheeled) Transfers: Sit to/from Stand Sit to Stand: Mod assist         General transfer comment: vc's for hand placement and to keep from flexing hip too much, mod A for power up, good maintenance of TWB  Ambulation/Gait Ambulation/Gait assistance: Min assist Ambulation Distance (Feet): 15 Feet Assistive device: Rolling walker (2 wheeled) Gait Pattern/deviations: Step-to pattern;Trunk flexed Gait velocity: very slow   General Gait Details: Step-by-step cues for gait sequence; Cues to keep weight off of RLE; good job suporting self on RW while stepping LLE -- needed the cue to try to stay NWB RLE   Stairs            Wheelchair Mobility    Modified Rankin (Stroke Patients Only)       Balance                                    Cognition Arousal/Alertness: Awake/alert Behavior During Therapy: WFL for tasks assessed/performed Overall Cognitive Status: Within Functional Limits for tasks assessed       Memory: Decreased recall of precautions              Exercises Total Joint Exercises Ankle Circles/Pumps: AROM;Both;20 reps;Seated Quad Sets: AROM;Both;10 reps;Seated Heel Slides: AAROM;Right;10 reps Hip ABduction/ADduction: AAROM;Right;10 reps;Seated    General Comments        Pertinent Vitals/Pain Pain Assessment: Faces Faces Pain Scale: Hurts even more Pain Location: R hip with motion Pain  Descriptors / Indicators: Aching;Burning (burning with heel slides) Pain Intervention(s): Limited activity within patient's tolerance;Monitored during session;Repositioned    Home Living                      Prior Function            PT Goals (current goals can now be found in the care plan section) Acute Rehab PT Goals PT Goal Formulation: With patient Time For Goal Achievement: 10/28/14 Potential to Achieve Goals: Good Progress towards PT  goals: Progressing toward goals    Frequency  Min 4X/week    PT Plan Discharge plan needs to be updated    Co-evaluation             End of Session Equipment Utilized During Treatment: Gait belt Activity Tolerance: Patient tolerated treatment well Patient left: in chair;with call bell/phone within reach;with family/visitor present     Time: 4580-9983 PT Time Calculation (min) (ACUTE ONLY): 31 min  Charges:  $Gait Training: 8-22 mins $Therapeutic Exercise: 8-22 mins                    G Codes:      Quin Hoop 10/16/2014, 10:46 AM  Roney [place], [place] Pager [phone] Office [phone]

## 2014-10-16 NOTE — Progress Notes (Signed)
Physical Therapy Evaluation Note  (This is a late entry to complete the PT Evaluation completed on Sunday, 3/13, which was documented as a treatment session in error)  Clinical Impression: Pt is s/p THA revision resulting in the deficits listed below (see PT Problem List).  Pt will benefit from skilled PT to increase their independence and safety with mobility to allow discharge to the venue listed below.   We discussed discharge disposition, and pt is still agreeable to post-acute rehabilitation; PT had originally recommended return to SNF level post-acute rehab on eval on 3/13, however, given her progress and good participation in PT sessions since 3/13, and her age perhaps qualifying her for medical necessity, I believe it is worth giving a second look at [facility] for rehab; will request a screen.   10/14/14 1300  PT Visit Information  Last PT Received On 10/14/14  Assistance Needed (+2 helpful initially)  History of Present Illness HPI: [name] is a 81 y.o. female with a recent Right THA on 09/22/2014 who was discharged to the [facility] but she continued to have increased pain and was unable to bear weight on her rt leg and unable to participate with her physical therapy. At the facility an X-ray of the Right hip was performed, which revealed extension of the fracture above the hardware.  Precautions  Precautions Posterior Hip;Fall  Precaution Booklet Issued Yes (comment)  Precaution Comments Reviewed hip precautions; pt recalled 2/3  Restrictions  RLE Weight Bearing TWB  Home Living  Family/patient expects to be discharged to: Other (Comment) (from SNF, but pt requesting to consider CIR)  Living Arrangements Spouse/significant other  Available Help at Discharge Available 24 hours/day (Will need clarification)  Type of [place] Access Level entry  [place] One level;Laundry or work area in [employer] - single point;Walker -  2 wheels  Prior Function  Level of Independence Needs assistance  Gait / Transfers Assistance Needed difficulty during rehab at [facility]  Communication  Communication No difficulties  Pain Assessment  Pain Assessment 0-10  Pain Score 8  Pain Location R Hip  Pain Descriptors / Indicators Aching;Grimacing;Discomfort  Pain Intervention(s) Limited activity within patient's tolerance;Monitored during session;Repositioned;Patient requesting pain meds-RN notified  Cognition  Arousal/Alertness Awake/alert  Behavior During Therapy WFL for tasks assessed/performed  Overall Cognitive Status Within Functional Limits for tasks assessed  Upper Extremity Assessment  Upper Extremity Assessment Generalized weakness  Lower Extremity Assessment  Lower Extremity Assessment RLE deficits/detail  RLE Deficits / Details Minimal tolerance of any motion due to pain; noted slight valgus at knee; very swollen thigh  RLE Unable to fully assess due to pain  Cervical / Trunk Assessment  Cervical / Trunk Assessment Normal  Bed Mobility  Overal bed mobility Needs Assistance  Bed Mobility Supine to Sit  Supine to sit +2 for safety/equipment;Mod assist  General bed mobility comments Cues for technique; near constant support provided to RLE given pain level  Transfers  Overall transfer level Needs assistance  Equipment used Rolling walker (2 wheeled)  Transfers Sit to/from Stand  Sit to Stand Mod assist  General transfer comment vc's for hand placement, mod A for power up  Ambulation/Gait  Ambulation/Gait assistance +2 safety/equipment;Min assist  Ambulation Distance (Feet) (pivot steps bed to recliner)  Assistive device Rolling walker (2 wheeled)  General Gait Details Cues to keep weight off of RLE; good job suporting self on RW while stepping LLE  Balance  Sitting balance-Leahy Scale  Fair  Standing balance-Leahy Scale Poor  PT - End of Session  Equipment Utilized During Treatment Gait belt  Activity Tolerance  Patient limited by fatigue  Patient left in chair;with call bell/phone within reach  Nurse Communication Mobility status  PT Assessment  PT Therapy Diagnosis  Difficulty walking;Generalized weakness;Acute pain  PT Recommendation/Assessment Patient needs continued PT services  PT Problem List Decreased strength;Decreased activity tolerance;Decreased balance;Decreased mobility;Pain;Decreased knowledge of precautions;Decreased safety awareness;Decreased knowledge of use of DME  PT Plan  PT Frequency (ACUTE ONLY) Min 4X/week  PT Treatment/Interventions (ACUTE ONLY) DME instruction;Gait training;Functional mobility training;Therapeutic activities;Therapeutic exercise;Patient/family education;Balance training  PT Recommendation  Recommendations for Other Services Rehab consult;OT consult  Follow Up Recommendations Supervision/Assistance - 24 hour;CIR  PT equipment Rolling walker with 5" wheels;3in1 (PT)  Individuals Consulted  Consulted and Agree with Results and Recommendations Patient  Acute Rehab PT Goals  Patient Stated Goal wants to feel better  PT Goal Formulation With patient  Time For Goal Achievement 10/28/14  Potential to Achieve Goals Good  PT Time Calculation  PT Start Time (ACUTE ONLY) 1135  PT Stop Time (ACUTE ONLY) 1212  PT Time Calculation (min) (ACUTE ONLY) 37 min  PT General Charges  $$ ACUTE PT VISIT 1 Procedure  PT Evaluation  $Initial PT Evaluation Tier I 1 Procedure  PT Treatments  $Therapeutic Activity 8-22 mins    Roney Marion, PT  Acute Rehabilitation Services Pager [phone] Office [phone]

## 2014-10-16 NOTE — Progress Notes (Signed)
Rehab Admissions Coordinator Note:  Patient was screened by Cleatrice Burke for appropriateness for an Inpatient Acute Rehab Consult per PT recommendations  At this time, we are recommending [place]. I spoke with pt by phone. Her fiance, [name] , has been staying with her. He is 80 yo and uses a cane. Pt does not want to go back to [facility] and was inquiring into a possible inpt rehab stay. I discussed that with her limited supports and TDWB status that she will need a longer rehab stay that 10 to 14 days before returning home, therefore that I am recommending SNF. She is requesting the [facility].   Cleatrice Burke 10/16/2014, 11:11 AM  I can be reached at [phone].

## 2014-10-16 NOTE — Progress Notes (Signed)
   Subjective:  Patient reports pain as mild to moderate.  No c/o currently. Denies N/V/CP/SOB.  Objective:   VITALS:   Filed Vitals:   10/15/14 1500 10/15/14 2100 10/16/14 0500 10/16/14 0639  BP: 149/63 135/49 181/73 144/56  Pulse: 84 86 95   Temp: 98.7 F (37.1 C) 98.5 F (36.9 C) 98.2 F (36.8 C)   TempSrc:      Resp: 20 18 18    Height:      Weight:      SpO2: 98% 97% 97%     ABD soft Sensation intact distally Intact pulses distally Dorsiflexion/Plantar flexion intact Incision: dressing C/D/I  Lab Results  Component Value Date   WBC 8.4 10/16/2014   HGB 11.2* 10/16/2014   HCT 34.6* 10/16/2014   MCV 92.5 10/16/2014   PLT 214 10/16/2014   BMET    Component Value Date/Time   NA 139 10/15/2014 0604   K 3.7 10/15/2014 0604   CL 102 10/15/2014 0604   CO2 31 10/15/2014 0604   GLUCOSE 101* 10/15/2014 0604   BUN 9 10/15/2014 0604   CREATININE 0.63 10/15/2014 0604   CALCIUM 8.2* 10/15/2014 0604   GFRNONAA 80* 10/15/2014 0604   GFRAA >90 10/15/2014 0604     Assessment/Plan: 3 Days Post-Op   Principal Problem:   Periprosthetic fracture around internal prosthetic right hip joint Active Problems:   Hip fracture   COPD exacerbation   Acute cystitis without hematuria   Thyroid activity decreased   Protein-calorie malnutrition, severe   Peri-prosthetic fracture of femur following total hip arthroplasty  TDWB RLE with walker PO pain control DVT ppx: no further hemoptysis- lovenox x30 days, SCDs, TEDs intraop cx NGTD PT/OT Discharge to SNF    Keeon Zurn, Horald Pollen 10/16/2014, 8:35 AM   Rod Can, MD Cell [phone]

## 2014-10-16 NOTE — Progress Notes (Signed)
PROGRESS NOTE  [name]:[id number] DOB: [birth year] DOA: 10/10/2014 PCP: [name], MD  HPI: 81 y.o. female with a recent Right THA on 09/22/2014 who was discharged to the [facility] but she continued to have increased pain and was unable to bear weight on her rt leg and unable to participate with her physical therapy. At the facility an X-ray of the Right hip was performed, which revealed extension of the fracture above the hardware  Subjective / 24 H Interval events - Ongoing hip discomfort - complains of constipation  Assessment/Plan: Principal Problem:   Periprosthetic fracture around internal prosthetic right hip joint Active Problems:   Hip fracture   COPD exacerbation   Acute cystitis without hematuria   Thyroid activity decreased   Protein-calorie malnutrition, severe   Peri-prosthetic fracture of femur following total hip arthroplasty   Acute fracture of the lesser trochanter of the proximal right femur  - orthopedic surgery consulted, patient underwent surgery 3/12 - PT recommended SNF, seen by CIR as well and recommending SNF also  ABLA - s/p 1U pRBC on 3/12, stable Hb since  UTI - on Rocephin, cultures with 30,000 colonies, multiple bacterial morphotypes.  - Likely cultures negative since she has already been on antibiotics.  - plan for a 5 day course, stopped antibiotics 3/14   Constipation - bowel regimen - with nausea this morning, obtain abdominal XR - enema, suppository   COPD/bronchiectasis - mild hemoptysis 3/11, unclear quantity - Hb stable, no increased oxygen needs - closely monitor  Hypothyroidism - continue synthroid   Diet: Diet regular Diet - low sodium heart healthy Fluids: none  DVT Prophylaxis: per ortho  Code Status: Full Code Family Communication: d/w patient   Disposition Plan: remain inpatient, SNF perhaps Wed  Consultants:  Orthopedic surgery   Procedures:  None     Antibiotics Ceftriaxone 3/10 >> 3/14   Studies  No results found. Objective  Filed Vitals:   10/15/14 2100 10/16/14 0500 10/16/14 0639 10/16/14 1319  BP: 135/49 181/73 144/56 131/48  Pulse: 86 95  98  Temp: 98.5 F (36.9 C) 98.2 F (36.8 C)  98.1 F (36.7 C)  TempSrc:    Oral  Resp: 18 18  18   Height:      Weight:      SpO2: 97% 97%  96%   Exam:  General:  NAD  HEENT: no scleral icterus  Cardiovascular: RRR   Respiratory: decreased breath sounds overall, no wheezing  Abdomen: soft, non tender  MSK/Extremities: no clubbing/cyanosis   Skin: no rashes  Neuro: non focal   Data Reviewed: Basic Metabolic Panel:  Recent Labs Lab 10/10/14 2211 10/11/14 0725 10/12/14 0457 10/13/14 1138 10/13/14 1257 10/13/14 1334 10/14/14 0558 10/15/14 0604  NA 135 136 138 138 138 139 139 139  K 4.6 3.9 4.1 3.9 3.7 3.6 3.8 3.7  CL 100 97 101  --   --   --  103 102  CO2 27 35* 30  --   --   --  31 31  GLUCOSE 136* 103* 104* 110* 107* 98 110* 101*  BUN 14 10 8   --   --   --  8 9  CREATININE 0.76 0.65 0.69  --   --   --  0.71 0.63  CALCIUM 8.7 8.5 8.3*  --   --   --  8.0* 8.2*   CBC:  Recent Labs Lab 10/10/14 2211 10/11/14 0725 10/12/14 0457  10/13/14 1257 10/13/14 1334 10/14/14  7846 10/15/14 0604 10/16/14 0455  WBC 11.2* 8.5 7.5  --   --   --  7.8 8.8 8.4  NEUTROABS 9.2*  --   --   --   --   --   --   --   --   HGB 12.2 11.6* 11.0*  < > 9.2* 8.2* 10.8* 10.7* 11.2*  HCT 38.1 36.4 34.0*  < > 27.0* 24.0* 32.8* 32.6* 34.6*  MCV 95.5 98.4 96.6  --   --   --  91.1 91.6 92.5  PLT 259 251 223  --   --   --  181 165 214  < > = values in this interval not displayed. CBG:  Recent Labs Lab 10/12/14 1626  GLUCAP 94   Recent Results (from the past 240 hour(s))  Culture, Urine     Status: None   Collection Time: 10/11/14  9:05 AM  Result Value Ref Range Status   Specimen Description URINE, CLEAN CATCH  Final   Special Requests NONE  Final   Colony Count    Final    >=100,000 COLONIES/ML Performed at [facility]    Culture   Final    ESCHERICHIA COLI Performed at [facility]    Report Status 10/13/2014 FINAL  Final   Organism ID, Bacteria ESCHERICHIA COLI  Final      Susceptibility   Escherichia coli - MIC*    AMPICILLIN >=32 RESISTANT Resistant     CEFAZOLIN >=64 RESISTANT Resistant     CEFTRIAXONE <=1 SENSITIVE Sensitive     CIPROFLOXACIN >=4 RESISTANT Resistant     GENTAMICIN <=1 SENSITIVE Sensitive     LEVOFLOXACIN >=8 RESISTANT Resistant     NITROFURANTOIN <=16 SENSITIVE Sensitive     TOBRAMYCIN <=1 SENSITIVE Sensitive     TRIMETH/SULFA >=320 RESISTANT Resistant     PIP/TAZO 64 INTERMEDIATE Intermediate     * ESCHERICHIA COLI  Urine culture     Status: None   Collection Time: 10/12/14  9:05 AM  Result Value Ref Range Status   Specimen Description URINE, RANDOM  Final   Special Requests NONE  Final   Colony Count   Final    30,000 COLONIES/ML Performed at [facility]    Culture   Final    Multiple bacterial morphotypes present, none predominant. Suggest appropriate recollection if clinically indicated. Performed at [facility]    Report Status 10/13/2014 FINAL  Final  Anaerobic culture     Status: None (Preliminary result)   Collection Time: 10/13/14 11:33 AM  Result Value Ref Range Status   Specimen Description HIP RIGHT FLUID  Final   Special Requests NONE  Final   Gram Stain   Final    RARE WBC PRESENT, PREDOMINANTLY PMN NO ORGANISMS SEEN Performed at [facility]    Culture   Final    NO ANAEROBES ISOLATED; CULTURE IN PROGRESS FOR 5 DAYS Performed at [facility]    Report Status PENDING  Incomplete  Anaerobic culture     Status: None (Preliminary result)   Collection Time: 10/13/14 11:36 AM  Result Value Ref Range Status   Specimen Description HIP RIGHT FLUID B  Final   Special Requests NONE  Final   Gram Stain   Final    NO WBC SEEN NO SQUAMOUS  EPITHELIAL CELLS SEEN NO ORGANISMS SEEN Performed at [facility]    Culture   Final    NO ANAEROBES ISOLATED; CULTURE IN PROGRESS FOR 5  DAYS Performed at [facility]    Report Status PENDING  Incomplete  Anaerobic culture     Status: None (Preliminary result)   Collection Time: 10/13/14 11:47 AM  Result Value Ref Range Status   Specimen Description HIP RIGHT FLUID C  Final   Special Requests NONE  Final   Gram Stain   Final    RARE WBC PRESENT, PREDOMINANTLY PMN NO ORGANISMS SEEN Performed at [facility]    Culture   Final    NO ANAEROBES ISOLATED; CULTURE IN PROGRESS FOR 5 DAYS Performed at [facility]    Report Status PENDING  Incomplete  Body fluid culture     Status: None (Preliminary result)   Collection Time: 10/13/14 11:50 AM  Result Value Ref Range Status   Specimen Description HIP RIGHT FLUID D  Final   Special Requests NONE  Final   Gram Stain   Final    FEW WBC PRESENT, PREDOMINANTLY PMN NO ORGANISMS SEEN Performed at [facility]    Culture   Final    NO GROWTH 3 DAYS Performed at [facility]    Report Status PENDING  Incomplete  Body fluid culture     Status: None (Preliminary result)   Collection Time: 10/13/14 11:53 AM  Result Value Ref Range Status   Specimen Description HIP RIGHT FLUID E  Final   Special Requests NONE  Final   Gram Stain   Final    RARE WBC PRESENT,BOTH PMN AND MONONUCLEAR NO ORGANISMS SEEN Performed at [facility]    Culture   Final    NO GROWTH 3 DAYS Performed at [facility]    Report Status PENDING  Incomplete  Body fluid culture     Status: None (Preliminary result)   Collection Time: 10/13/14 11:55 AM  Result Value Ref Range Status   Specimen Description HIP RIGHT FLUID F  Final   Special Requests NONE  Final   Gram Stain   Final    RARE WBC PRESENT, PREDOMINANTLY PMN NO ORGANISMS SEEN Performed at [facility]    Culture   Final    NO  GROWTH 3 DAYS Performed at [facility]    Report Status PENDING  Incomplete     Scheduled Meds: . acetaminophen  1,000 mg Oral 3 times per day   Or  . acetaminophen  650 mg Rectal 3 times per day  . enoxaparin (LOVENOX) injection  40 mg Subcutaneous Q24H  . feeding supplement (ENSURE COMPLETE)  237 mL Oral BID BM  . Glycerin (Adult)  1 suppository Rectal Once  . levothyroxine  75 mcg Oral QAC breakfast  . polyethylene glycol  17 g Oral BID  . senna-docusate  1 tablet Oral QHS   Continuous Infusions: . sodium chloride 10 mL/hr at 10/14/14 0817    Marzetta Board, MD [facility] Pager [phone]. If 7 PM - 7 AM, please contact night-coverage at [email or web address], password [facility] 10/16/2014, 2:16 PM  LOS: 5 days

## 2014-10-17 DIAGNOSIS — Z96641 Presence of right artificial hip joint: Secondary | ICD-10-CM | POA: Diagnosis not present

## 2014-10-17 DIAGNOSIS — K5909 Other constipation: Secondary | ICD-10-CM | POA: Diagnosis not present

## 2014-10-17 DIAGNOSIS — T8484XD Pain due to internal orthopedic prosthetic devices, implants and grafts, subsequent encounter: Secondary | ICD-10-CM | POA: Diagnosis not present

## 2014-10-17 DIAGNOSIS — S7291XA Unspecified fracture of right femur, initial encounter for closed fracture: Secondary | ICD-10-CM | POA: Diagnosis not present

## 2014-10-17 DIAGNOSIS — R2689 Other abnormalities of gait and mobility: Secondary | ICD-10-CM | POA: Diagnosis not present

## 2014-10-17 DIAGNOSIS — S73004A Unspecified dislocation of right hip, initial encounter: Secondary | ICD-10-CM | POA: Diagnosis not present

## 2014-10-17 DIAGNOSIS — Z471 Aftercare following joint replacement surgery: Secondary | ICD-10-CM | POA: Diagnosis not present

## 2014-10-17 DIAGNOSIS — N390 Urinary tract infection, site not specified: Secondary | ICD-10-CM | POA: Diagnosis not present

## 2014-10-17 DIAGNOSIS — E43 Unspecified severe protein-calorie malnutrition: Secondary | ICD-10-CM | POA: Diagnosis not present

## 2014-10-17 DIAGNOSIS — J449 Chronic obstructive pulmonary disease, unspecified: Secondary | ICD-10-CM | POA: Diagnosis not present

## 2014-10-17 DIAGNOSIS — K219 Gastro-esophageal reflux disease without esophagitis: Secondary | ICD-10-CM | POA: Diagnosis not present

## 2014-10-17 DIAGNOSIS — M25551 Pain in right hip: Secondary | ICD-10-CM | POA: Diagnosis not present

## 2014-10-17 DIAGNOSIS — N3000 Acute cystitis without hematuria: Secondary | ICD-10-CM | POA: Diagnosis not present

## 2014-10-17 DIAGNOSIS — E038 Other specified hypothyroidism: Secondary | ICD-10-CM | POA: Diagnosis not present

## 2014-10-17 DIAGNOSIS — E039 Hypothyroidism, unspecified: Secondary | ICD-10-CM | POA: Diagnosis not present

## 2014-10-17 DIAGNOSIS — E440 Moderate protein-calorie malnutrition: Secondary | ICD-10-CM | POA: Diagnosis not present

## 2014-10-17 DIAGNOSIS — R531 Weakness: Secondary | ICD-10-CM | POA: Diagnosis not present

## 2014-10-17 DIAGNOSIS — T84040A Periprosthetic fracture around internal prosthetic right hip joint, initial encounter: Secondary | ICD-10-CM | POA: Diagnosis not present

## 2014-10-17 LAB — BODY FLUID CULTURE
CULTURE: NO GROWTH
Culture: NO GROWTH
Culture: NO GROWTH

## 2014-10-17 MED ORDER — NITROFURANTOIN MONOHYD MACRO 100 MG PO CAPS
100.0000 mg | ORAL_CAPSULE | Freq: Two times a day (BID) | ORAL | Status: DC
  Administered 2014-10-17: 100 mg via ORAL
  Filled 2014-10-17 (×2): qty 1

## 2014-10-17 MED ORDER — SENNOSIDES-DOCUSATE SODIUM 8.6-50 MG PO TABS: 1.0000 | ORAL_TABLET | Freq: Every day | ORAL | Status: DC

## 2014-10-17 MED ORDER — NITROFURANTOIN MONOHYD MACRO 100 MG PO CAPS: 100.0000 mg | ORAL_CAPSULE | Freq: Two times a day (BID) | ORAL | Status: DC

## 2014-10-17 MED ORDER — FLEET ENEMA 7-19 GM/118ML RE ENEM
1.0000 | ENEMA | Freq: Once | RECTAL | Status: AC
  Administered 2014-10-17: 1 via RECTAL
  Filled 2014-10-17: qty 1

## 2014-10-17 NOTE — Discharge Planning (Signed)
Patient will discharge today per MD order. Patient will discharge to [facility] RN to call report prior to transportation to [phone] Transportation: [facility]  CSW sent discharge summary to SNF for review.  Packet is complete.  RN, patient and family aware of discharge plans. CSW provided patient and significant other with driving directions to the [facility].  Patient requested [facility]; however, Tammy at [facility] was unable to offer bed at this time.  Patient is aware and agreeable to [place] for STR.  Nonnie Done, [place] [phone]  Psychiatric & Orthopedics (5N 1-16) Clinical Social Worker

## 2014-10-17 NOTE — Progress Notes (Signed)
Patient being discharged to [facility]. Patient and spouse at bedside made aware of discharge. [facility] will be transporting patient.

## 2014-10-17 NOTE — Progress Notes (Signed)
   Subjective:  Patient reports pain as mild to moderate.  No c/o currently. C/o nausea - had BM yesterday. Denies V/CP/SOB.  Objective:   VITALS:   Filed Vitals:   10/16/14 1941 10/16/14 2209 10/17/14 0000 10/17/14 0709  BP:  152/64  129/56  Pulse:  82  92  Temp:  98.2 F (36.8 C)  98.1 F (36.7 C)  TempSrc:  Oral  Oral  Resp: 17 16 17 16   Height:      Weight:      SpO2: 94% 97%  97%    ABD soft Sensation intact distally Intact pulses distally Dorsiflexion/Plantar flexion intact Incision: dressing C/D/I  Lab Results  Component Value Date   WBC 8.4 10/16/2014   HGB 11.2* 10/16/2014   HCT 34.6* 10/16/2014   MCV 92.5 10/16/2014   PLT 214 10/16/2014   BMET    Component Value Date/Time   NA 139 10/15/2014 0604   K 3.7 10/15/2014 0604   CL 102 10/15/2014 0604   CO2 31 10/15/2014 0604   GLUCOSE 101* 10/15/2014 0604   BUN 9 10/15/2014 0604   CREATININE 0.63 10/15/2014 0604   CALCIUM 8.2* 10/15/2014 0604   GFRNONAA 80* 10/15/2014 0604   GFRAA >90 10/15/2014 0604     Assessment/Plan: 4 Days Post-Op   Principal Problem:   Periprosthetic fracture around internal prosthetic right hip joint Active Problems:   Hip fracture   COPD exacerbation   Acute cystitis without hematuria   Thyroid activity decreased   Protein-calorie malnutrition, severe   Peri-prosthetic fracture of femur following total hip arthroplasty  TDWB RLE with walker PO pain control DVT ppx: lovenox x30 days, SCDs, TEDs intraop cx negative PT/OT Discharge to SNF when ok with hospitalist    [name], Horald Pollen 10/17/2014, 8:00 AM   Rod Can, MD Cell [phone]

## 2014-10-17 NOTE — Discharge Summary (Signed)
Physician Discharge Summary  [name] XTK:[id number] DOB: 1926/12/13 DOA: 10/10/2014  PCP: [name], MD  Admit date: 10/10/2014 Discharge date: 10/17/2014  Time spent: 45 minutes  Recommendations for Outpatient Follow-up:  1. Follow up with Dr. Forde Dandy in 2 weeks 2. Follow up with Dr. Lyla Glassing in 2 weeks 3. Continue Macrobid for 4 additional days    Discharge Diagnoses:  Principal Problem:   Periprosthetic fracture around internal prosthetic right hip joint Active Problems:   Hip fracture   COPD exacerbation   Acute cystitis without hematuria   Thyroid activity decreased   Protein-calorie malnutrition, severe   Peri-prosthetic fracture of femur following total hip arthroplasty  Discharge Condition: stable  Diet recommendation: heart healthy  Filed Weights   10/13/14 0900 10/13/14 1738  Weight: 61.7 kg (136 lb 0.4 oz) 61.689 kg (136 lb)   History of present illness:  [name] is a 81 y.o. female with a recent Right THA on 09/22/2014 who was discharged to the [facility] but she continued to have increased pain and was unable to bear weight on her rt leg and unable to participate with her physical therapy.At the facility an X-ray of the Right hip was performed, which revealed extension of the fracture above the hardware.She denies any falls or trauma. [facility] was consulted and will se her in the AM.  Hospital Course:  Acute fracture of the lesser trochanter of the proximal right femur - orthopedic surgery was consulted on patient's admission and have followed patient while hospitalized. Her Xarelto was held for 2 days then she underwent revision of femoral component, right hip hemiarthroplasty and open reduction internal fixation of periprosthetic femur frature on 3/12 per Dr. Lyla Glassing. Patient recovered well post op, had mild anemia requiring 1U of pRBC. Her hemoglobin improved appropriately and has remained stable. She is  to follow up in 2 weeks as an outpatient.  ABLA - s/p 1U pRBC on 3/12, stable Hb since UTI - on Rocephin for 4 days, cultures with E coli sensitive to Ceftriaxone, her antibiotics were changed to Nitrofurantoin and will need 4 additional days after discharge. Patient has chronic dysuria and would advise to minimize exposure to antibiotics in the future to prevent development of resistance.  Constipation - bowel regimen, improved COPD/bronchiectasis - mild hemoptysis 3/11, unclear quantity, none since. This is not new, now stable, no evidence of exacerbation, no further episodes of hemoptysis, will recommend outpatient follow up with her pulmonologist.  Hypothyroidism - continue synthroid  Procedures:  Hip surgery 3/12   Consultations:  Orthopedic surgery   Discharge Exam: Filed Vitals:   10/16/14 1941 10/16/14 2209 10/17/14 0000 10/17/14 0709  BP:  152/64  129/56  Pulse:  82  92  Temp:  98.2 F (36.8 C)  98.1 F (36.7 C)  TempSrc:  Oral  Oral  Resp: 17 16 17 16   Height:      Weight:      SpO2: 94% 97%  97%    General: NAD Cardiovascular: RRR Respiratory: CTA biL  Discharge Instructions      Discharge Instructions    Call MD / Call 911    Complete by:  As directed   If you experience chest pain or shortness of breath, CALL 911 and be transported to the hospital emergency room.  If you develope a fever above 101 F, pus (white drainage) or increased drainage or redness at the wound, or calf pain, call your surgeon's office.  Constipation Prevention    Complete by:  As directed   Drink plenty of fluids.  Prune juice may be helpful.  You may use a stool softener, such as Colace (over the counter) 100 mg twice a day.  Use MiraLax (over the counter) for constipation as needed.     Diet - low sodium heart healthy    Complete by:  As directed      Discharge instructions    Complete by:  As directed   Touch down weight bearing right leg  When discharged from the skilled  rehab facility, please have the facility set up the patient's [place] prior to being released.   Also provide the patient with their medications at time of release from the facility to include their pain medication and their blood thinner medication.  If the patient is still at the rehab facility at time of follow up appointment, please also assist the patient in arranging follow up appointment in our office and any transportation needs. ICE to the affected knee or hip every three hours for 30 minutes at a time and then as needed for pain and swelling.     Follow the hip precautions as taught in Physical Therapy    Complete by:  As directed      Increase activity slowly as tolerated    Complete by:  As directed             Medication List    STOP taking these medications        aspirin EC 325 MG tablet     cefixime 400 MG tablet  Commonly known as:  SUPRAX     HYDROcodone-acetaminophen 5-325 MG per tablet  Commonly known as:  NORCO     rivaroxaban 10 MG Tabs tablet  Commonly known as:  XARELTO     tuberculin 5 UNIT/0.1ML injection      TAKE these medications        acetaminophen 500 MG tablet  Commonly known as:  TYLENOL  Take 2 tablets (1,000 mg total) by mouth every 8 (eight) hours.     albuterol 108 (90 BASE) MCG/ACT inhaler  Commonly known as:  PROVENTIL HFA;VENTOLIN HFA  Inhale 2 puffs into the lungs every 6 (six) hours as needed.     alum & mag hydroxide-simeth 675-916-38 MG/5ML suspension  Commonly known as:  MAALOX PLUS  Take 30 mLs by mouth every 6 (six) hours as needed for indigestion.     cyanocobalamin 1000 MCG/ML injection  Commonly known as:  (VITAMIN B-12)  Inject 1,000 mcg into the muscle every 30 (thirty) days.     docusate sodium 100 MG capsule  Commonly known as:  COLACE  Take 1 capsule (100 mg total) by mouth 2 (two) times daily.     enoxaparin 40 MG/0.4ML injection  Commonly known as:  LOVENOX  Inject 0.4 mLs (40 mg  total) into the skin daily.     hydrocortisone 25 MG suppository  Commonly known as:  ANUSOL-HC  Place 25 mg rectally as needed for hemorrhoids.     hydroxypropyl methylcellulose / hypromellose 2.5 % ophthalmic solution  Commonly known as:  ISOPTO TEARS / GONIOVISC  Place 1 drop into both eyes as needed for dry eyes.     lansoprazole 15 MG capsule  Commonly known as:  PREVACID  Take 15 mg by mouth every morning.     levothyroxine 75 MCG tablet  Commonly known as:  SYNTHROID, LEVOTHROID  Take 75  mcg by mouth daily.     multivitamin capsule  Take 1 capsule by mouth daily.     nitrofurantoin (macrocrystal-monohydrate) 100 MG capsule  Commonly known as:  MACROBID  Take 1 capsule (100 mg total) by mouth 2 (two) times daily.     nitroGLYCERIN 0.4 MG SL tablet  Commonly known as:  NITROSTAT  Place 0.4 mg under the tongue every 5 (five) minutes as needed for chest pain.     OXYGEN  Inhale 3 L into the lungs at bedtime.     polyethylene glycol packet  Commonly known as:  MIRALAX / GLYCOLAX  Take 17 g by mouth daily as needed for mild constipation.     senna-docusate 8.6-50 MG per tablet  Commonly known as:  Senokot-S  Take 1 tablet by mouth at bedtime.     SYSTANE 0.4-0.3 % Soln  Generic drug:  Polyethyl Glycol-Propyl Glycol  Apply 1 drop to eye 3 (three) times daily.     traMADol 50 MG tablet  Commonly known as:  ULTRAM  Take 1 tablet (50 mg total) by mouth every 6 (six) hours as needed for moderate pain.     Vitamin D3 1000 UNITS Caps  Take 1 capsule by mouth daily.       Follow-up Information    Follow up with Swinteck, Horald Pollen, MD. Schedule an appointment as soon as possible for a visit in 2 weeks.   Specialty:  Orthopedic Surgery   Why:  For wound re-check, For suture removal   Contact information:   [address] [address] [phone]       Follow up with [name], MD. Schedule an appointment as soon as possible for  a visit in 2 weeks.   Specialty:  Endocrinology   Contact information:   [place] [place] [address] [phone]       The results of significant diagnostics from this hospitalization (including imaging, microbiology, ancillary and laboratory) are listed below for reference.    Significant Diagnostic Studies: Dg Chest 1 View  10/10/2014   CLINICAL DATA:  Right hip fracture.  EXAM: CHEST  1 VIEW  COMPARISON:  09/21/2014  FINDINGS: Normal heart size and pulmonary vascularity. Diffuse emphysematous changes in the chest with scattered fibrosis. No focal airspace disease or consolidation. No blunting of the costophrenic angles. No pneumothorax. Calcified aorta.  IMPRESSION: Emphysematous and chronic bronchitic changes in the lungs. No evidence of active consolidation.   Electronically Signed   By: Lucienne Capers M.D.   On: 10/10/2014 22:45   Dg Chest 2 View  09/21/2014   CLINICAL DATA:  Right hip pain  EXAM: CHEST  2 VIEW  COMPARISON:  07/03/2014  FINDINGS: Normal heart size. There is no pleural effusion identified. No airspace consolidation. The lungs are hyperinflated and there are coarsened interstitial markings noted bilaterally. Bronchiectasis and scarring involving the right middle lobe and lingular portion the left lung are noted. The bones appear osteopenic and there is a scoliosis deformity involving the thoracic spine.  IMPRESSION: 1. No acute cardiopulmonary abnormalities. 2. Chronic interstitial changes.   Electronically Signed   By: Kerby Moors M.D.   On: 09/21/2014 12:37   Pelvis Portable  09/22/2014   CLINICAL DATA:  81 year old female with a history of right hip hemi arthroplasty.  EXAM: PORTABLE PELVIS 1-2 VIEWS  COMPARISON:  09/21/2014  FINDINGS: Interval surgical fixation of previously identified displaced right femoral neck fracture, with changes of right hip hemi arthroplasty.  Expected  soft tissue swelling and gas at the surgical site.  No complicating features.   Remainder of the bony structures unremarkable.  IMPRESSION: Interval surgical changes of right hip hemi arthroplasty for fixation of previous identified displaced femoral neck fracture. No complicating features.  Signed,  Dulcy Fanny. Earleen Newport, DO  [facility]  [facility]   Electronically Signed   By: Corrie Mckusick D.O.   On: 09/22/2014 11:26   Dg Abd Portable 1v  10/16/2014   CLINICAL DATA:  Abdominal pain and constipation for the past 5 days. Status post right hip prosthesis placement 3 days ago.  EXAM: PORTABLE ABDOMEN - 1 VIEW  COMPARISON:  Pelvis dated 09/22/2014.  FINDINGS: Normal bowel gas pattern. Mildly prominent stool. Right hip prosthesis with interval cerclage wires.  IMPRESSION: No acute abnormality.  Mildly prominent stool.   Electronically Signed   By: Claudie Revering M.D.   On: 10/16/2014 14:30   Dg Hip Port Unilat With Pelvis 1v Right  10/13/2014   CLINICAL DATA:  Postop check  EXAM: RIGHT HIP (WITH PELVIS) 1 VIEW PORTABLE  COMPARISON:  10/10/2014  FINDINGS: Right hip arthroplasty device is identified. The femoral shaft appears secured by four cerclage wires. The hardware components are in anatomic alignment. No periprosthetic fracture or subluxation. Surgical drain overlies the proximal right femur. There is gas identified within the surrounding soft tissues as well as overlying skin staples.  IMPRESSION: 1. Status post right hip arthroplasty.  No complications.   Electronically Signed   By: Kerby Moors M.D.   On: 10/13/2014 15:52   Dg Hip Operative Unilat With Pelvis Right  10/13/2014   CLINICAL DATA:  Posterior RIGHT hip revision  EXAM: OPERATIVE RIGHT HIP (WITH PELVIS IF PERFORMED) 2 VIEWS  TECHNIQUE: Fluoroscopic spot image(s) were submitted for interpretation post-operatively.  COMPARISON:  10/10/2014  FLUOROSCOPY TIME:  FLUOROSCOPY TIME 1 min 49 seconds  Number images obtained:  2  FINDINGS: New RIGHT hip prosthesis identified.  Superior  aspect of the femoral head component and acetabulum are not completely imaged.  4 cerclage wires present.  No fracture or bone destruction seen.  IMPRESSION: RIGHT hip prosthesis without acute fracture identified on study limited by incomplete visualization.   Electronically Signed   By: Lavonia Dana M.D.   On: 10/13/2014 15:39   Dg Hip Unilat With Pelvis 2-3 Views Right  10/10/2014   CLINICAL DATA:  Previous hip fracture 2 weeks ago that was repaired. Patient is complaining of pain since then. Outside facility noted femoral fracture on x-ray.  EXAM: RIGHT HIP (WITH PELVIS) 2-3 VIEWS  COMPARISON:  09/21/2014  FINDINGS: Since the previous study, there is interval placement of bipolar left hip prosthesis with non cemented femoral components. There is an acute appearing fracture of the lesser trochanter extending to the bone hardware interface. The femoral prosthesis appears intact. No dislocation at the hip joint.  IMPRESSION: Right hip arthroplasty with acute fracture off of the lesser trochanter of the proximal right femur.   Electronically Signed   By: Lucienne Capers M.D.   On: 10/10/2014 22:43   Dg Hip Unilat With Pelvis 2-3 Views Right  09/21/2014   CLINICAL DATA:  Hip pain  EXAM: RIGHT HIP (WITH PELVIS) 2-3 VIEWS  COMPARISON:  None  FINDINGS: There is a right subcapital femoral neck fracture. There is displacement of the distal fracture fragments proximally. Left hip appears located and intact.  IMPRESSION: Acute right subcapital femoral neck fracture.   Electronically Signed   By:  Kerby Moors M.D.   On: 09/21/2014 12:38    Microbiology: Recent Results (from the past 240 hour(s))  Culture, Urine     Status: None   Collection Time: 10/11/14  9:05 AM  Result Value Ref Range Status   Specimen Description URINE, CLEAN CATCH  Final   Special Requests NONE  Final   Colony Count   Final    >=100,000 COLONIES/ML Performed at [facility]    Culture   Final    ESCHERICHIA COLI Performed  at [facility]    Report Status 10/13/2014 FINAL  Final   Organism ID, Bacteria ESCHERICHIA COLI  Final      Susceptibility   Escherichia coli - MIC*    AMPICILLIN >=32 RESISTANT Resistant     CEFAZOLIN >=64 RESISTANT Resistant     CEFTRIAXONE <=1 SENSITIVE Sensitive     CIPROFLOXACIN >=4 RESISTANT Resistant     GENTAMICIN <=1 SENSITIVE Sensitive     LEVOFLOXACIN >=8 RESISTANT Resistant     NITROFURANTOIN <=16 SENSITIVE Sensitive     TOBRAMYCIN <=1 SENSITIVE Sensitive     TRIMETH/SULFA >=320 RESISTANT Resistant     PIP/TAZO 64 INTERMEDIATE Intermediate     * ESCHERICHIA COLI  Urine culture     Status: None   Collection Time: 10/12/14  9:05 AM  Result Value Ref Range Status   Specimen Description URINE, RANDOM  Final   Special Requests NONE  Final   Colony Count   Final    30,000 COLONIES/ML Performed at [facility]    Culture   Final    Multiple bacterial morphotypes present, none predominant. Suggest appropriate recollection if clinically indicated. Performed at [facility]    Report Status 10/13/2014 FINAL  Final  Anaerobic culture     Status: None (Preliminary result)   Collection Time: 10/13/14 11:33 AM  Result Value Ref Range Status   Specimen Description HIP RIGHT FLUID  Final   Special Requests NONE  Final   Gram Stain   Final    RARE WBC PRESENT, PREDOMINANTLY PMN NO ORGANISMS SEEN Performed at [facility]    Culture   Final    NO ANAEROBES ISOLATED; CULTURE IN PROGRESS FOR 5 DAYS Performed at [facility]    Report Status PENDING  Incomplete  Anaerobic culture     Status: None (Preliminary result)   Collection Time: 10/13/14 11:36 AM  Result Value Ref Range Status   Specimen Description HIP RIGHT FLUID B  Final   Special Requests NONE  Final   Gram Stain   Final    NO WBC SEEN NO SQUAMOUS EPITHELIAL CELLS SEEN NO ORGANISMS SEEN Performed at [facility]    Culture   Final    NO ANAEROBES ISOLATED;  CULTURE IN PROGRESS FOR 5 DAYS Performed at [facility]    Report Status PENDING  Incomplete  Anaerobic culture     Status: None (Preliminary result)   Collection Time: 10/13/14 11:47 AM  Result Value Ref Range Status   Specimen Description HIP RIGHT FLUID C  Final   Special Requests NONE  Final   Gram Stain   Final    RARE WBC PRESENT, PREDOMINANTLY PMN NO ORGANISMS SEEN Performed at [facility]    Culture   Final    NO ANAEROBES ISOLATED; CULTURE IN PROGRESS FOR 5 DAYS Performed at [facility]    Report Status PENDING  Incomplete  Body fluid culture  Status: None (Preliminary result)   Collection Time: 10/13/14 11:50 AM  Result Value Ref Range Status   Specimen Description HIP RIGHT FLUID D  Final   Special Requests NONE  Final   Gram Stain   Final    FEW WBC PRESENT, PREDOMINANTLY PMN NO ORGANISMS SEEN Performed at [facility]    Culture   Final    NO GROWTH 3 DAYS Performed at [facility]    Report Status PENDING  Incomplete  Body fluid culture     Status: None (Preliminary result)   Collection Time: 10/13/14 11:53 AM  Result Value Ref Range Status   Specimen Description HIP RIGHT FLUID E  Final   Special Requests NONE  Final   Gram Stain   Final    RARE WBC PRESENT,BOTH PMN AND MONONUCLEAR NO ORGANISMS SEEN Performed at [facility]    Culture   Final    NO GROWTH 3 DAYS Performed at [facility]    Report Status PENDING  Incomplete  Body fluid culture     Status: None (Preliminary result)   Collection Time: 10/13/14 11:55 AM  Result Value Ref Range Status   Specimen Description HIP RIGHT FLUID F  Final   Special Requests NONE  Final   Gram Stain   Final    RARE WBC PRESENT, PREDOMINANTLY PMN NO ORGANISMS SEEN Performed at [facility]    Culture   Final    NO GROWTH 3 DAYS Performed at [facility]    Report Status PENDING  Incomplete     Labs: Basic Metabolic  Panel:  Recent Labs Lab 10/10/14 2211 10/11/14 0725 10/12/14 0457 10/13/14 1138 10/13/14 1257 10/13/14 1334 10/14/14 0558 10/15/14 0604  NA 135 136 138 138 138 139 139 139  K 4.6 3.9 4.1 3.9 3.7 3.6 3.8 3.7  CL 100 97 101  --   --   --  103 102  CO2 27 35* 30  --   --   --  31 31  GLUCOSE 136* 103* 104* 110* 107* 98 110* 101*  BUN 14 10 8   --   --   --  8 9  CREATININE 0.76 0.65 0.69  --   --   --  0.71 0.63  CALCIUM 8.7 8.5 8.3*  --   --   --  8.0* 8.2*   CBC:  Recent Labs Lab 10/10/14 2211 10/11/14 0725 10/12/14 0457  10/13/14 1257 10/13/14 1334 10/14/14 0558 10/15/14 0604 10/16/14 0455  WBC 11.2* 8.5 7.5  --   --   --  7.8 8.8 8.4  NEUTROABS 9.2*  --   --   --   --   --   --   --   --   HGB 12.2 11.6* 11.0*  < > 9.2* 8.2* 10.8* 10.7* 11.2*  HCT 38.1 36.4 34.0*  < > 27.0* 24.0* 32.8* 32.6* 34.6*  MCV 95.5 98.4 96.6  --   --   --  91.1 91.6 92.5  PLT 259 251 223  --   --   --  181 165 214  < > = values in this interval not displayed.  CBG:  Recent Labs Lab 10/12/14 1626 10/16/14 1648  GLUCAP 94 85       Signed:  Vito Beg  [facility] 10/17/2014, 10:55 AM

## 2014-10-18 ENCOUNTER — Encounter (HOSPITAL_COMMUNITY): Payer: Self-pay | Admitting: Orthopedic Surgery

## 2014-10-18 DIAGNOSIS — K219 Gastro-esophageal reflux disease without esophagitis: Secondary | ICD-10-CM | POA: Diagnosis not present

## 2014-10-18 DIAGNOSIS — E039 Hypothyroidism, unspecified: Secondary | ICD-10-CM | POA: Diagnosis not present

## 2014-10-18 DIAGNOSIS — S7291XA Unspecified fracture of right femur, initial encounter for closed fracture: Secondary | ICD-10-CM | POA: Diagnosis not present

## 2014-10-18 DIAGNOSIS — J449 Chronic obstructive pulmonary disease, unspecified: Secondary | ICD-10-CM | POA: Diagnosis not present

## 2014-10-18 LAB — ANAEROBIC CULTURE: Gram Stain: NONE SEEN

## 2014-10-19 ENCOUNTER — Encounter (HOSPITAL_COMMUNITY): Payer: Self-pay | Admitting: Orthopedic Surgery

## 2014-10-29 DIAGNOSIS — Z471 Aftercare following joint replacement surgery: Secondary | ICD-10-CM | POA: Diagnosis not present

## 2014-10-29 DIAGNOSIS — Z96641 Presence of right artificial hip joint: Secondary | ICD-10-CM | POA: Diagnosis not present

## 2014-11-02 DIAGNOSIS — N390 Urinary tract infection, site not specified: Secondary | ICD-10-CM | POA: Diagnosis not present

## 2014-11-02 DIAGNOSIS — T8484XD Pain due to internal orthopedic prosthetic devices, implants and grafts, subsequent encounter: Secondary | ICD-10-CM | POA: Diagnosis not present

## 2014-11-02 DIAGNOSIS — R2689 Other abnormalities of gait and mobility: Secondary | ICD-10-CM | POA: Diagnosis not present

## 2014-11-02 DIAGNOSIS — E038 Other specified hypothyroidism: Secondary | ICD-10-CM | POA: Diagnosis not present

## 2014-11-02 DIAGNOSIS — K5909 Other constipation: Secondary | ICD-10-CM | POA: Diagnosis not present

## 2014-11-02 DIAGNOSIS — R531 Weakness: Secondary | ICD-10-CM | POA: Diagnosis not present

## 2014-11-02 DIAGNOSIS — J449 Chronic obstructive pulmonary disease, unspecified: Secondary | ICD-10-CM | POA: Diagnosis not present

## 2014-11-02 DIAGNOSIS — T84040A Periprosthetic fracture around internal prosthetic right hip joint, initial encounter: Secondary | ICD-10-CM | POA: Diagnosis not present

## 2014-11-02 DIAGNOSIS — E440 Moderate protein-calorie malnutrition: Secondary | ICD-10-CM | POA: Diagnosis not present

## 2014-11-08 DIAGNOSIS — E038 Other specified hypothyroidism: Secondary | ICD-10-CM | POA: Diagnosis not present

## 2014-11-08 DIAGNOSIS — K219 Gastro-esophageal reflux disease without esophagitis: Secondary | ICD-10-CM | POA: Diagnosis not present

## 2014-11-08 DIAGNOSIS — Z471 Aftercare following joint replacement surgery: Secondary | ICD-10-CM | POA: Diagnosis not present

## 2014-11-08 DIAGNOSIS — Z87440 Personal history of urinary (tract) infections: Secondary | ICD-10-CM | POA: Diagnosis not present

## 2014-11-08 DIAGNOSIS — E440 Moderate protein-calorie malnutrition: Secondary | ICD-10-CM | POA: Diagnosis not present

## 2014-11-08 DIAGNOSIS — Z96641 Presence of right artificial hip joint: Secondary | ICD-10-CM | POA: Diagnosis not present

## 2014-11-08 DIAGNOSIS — J449 Chronic obstructive pulmonary disease, unspecified: Secondary | ICD-10-CM | POA: Diagnosis not present

## 2014-11-12 DIAGNOSIS — Z471 Aftercare following joint replacement surgery: Secondary | ICD-10-CM | POA: Diagnosis not present

## 2014-11-12 DIAGNOSIS — E038 Other specified hypothyroidism: Secondary | ICD-10-CM | POA: Diagnosis not present

## 2014-11-12 DIAGNOSIS — Z96641 Presence of right artificial hip joint: Secondary | ICD-10-CM | POA: Diagnosis not present

## 2014-11-12 DIAGNOSIS — K219 Gastro-esophageal reflux disease without esophagitis: Secondary | ICD-10-CM | POA: Diagnosis not present

## 2014-11-12 DIAGNOSIS — E440 Moderate protein-calorie malnutrition: Secondary | ICD-10-CM | POA: Diagnosis not present

## 2014-11-12 DIAGNOSIS — J449 Chronic obstructive pulmonary disease, unspecified: Secondary | ICD-10-CM | POA: Diagnosis not present

## 2014-11-13 ENCOUNTER — Ambulatory Visit: Payer: Medicare Other | Admitting: Pulmonary Disease

## 2014-11-14 ENCOUNTER — Telehealth: Payer: Self-pay | Admitting: Pulmonary Disease

## 2014-11-14 DIAGNOSIS — Z96641 Presence of right artificial hip joint: Secondary | ICD-10-CM | POA: Diagnosis not present

## 2014-11-14 DIAGNOSIS — E038 Other specified hypothyroidism: Secondary | ICD-10-CM | POA: Diagnosis not present

## 2014-11-14 DIAGNOSIS — K219 Gastro-esophageal reflux disease without esophagitis: Secondary | ICD-10-CM | POA: Diagnosis not present

## 2014-11-14 DIAGNOSIS — J449 Chronic obstructive pulmonary disease, unspecified: Secondary | ICD-10-CM | POA: Diagnosis not present

## 2014-11-14 DIAGNOSIS — E440 Moderate protein-calorie malnutrition: Secondary | ICD-10-CM | POA: Diagnosis not present

## 2014-11-14 DIAGNOSIS — Z471 Aftercare following joint replacement surgery: Secondary | ICD-10-CM | POA: Diagnosis not present

## 2014-11-14 NOTE — Telephone Encounter (Signed)
Called spoke with pt. I advised her do not see were we had ordered for a DME nurse to visit her. She needed nothing further

## 2014-11-29 DIAGNOSIS — T84040D Periprosthetic fracture around internal prosthetic right hip joint, subsequent encounter: Secondary | ICD-10-CM | POA: Diagnosis not present

## 2014-12-04 DIAGNOSIS — E785 Hyperlipidemia, unspecified: Secondary | ICD-10-CM | POA: Diagnosis not present

## 2014-12-04 DIAGNOSIS — E538 Deficiency of other specified B group vitamins: Secondary | ICD-10-CM | POA: Diagnosis not present

## 2014-12-04 DIAGNOSIS — F418 Other specified anxiety disorders: Secondary | ICD-10-CM | POA: Diagnosis not present

## 2014-12-04 DIAGNOSIS — J479 Bronchiectasis, uncomplicated: Secondary | ICD-10-CM | POA: Diagnosis not present

## 2014-12-04 DIAGNOSIS — I2510 Atherosclerotic heart disease of native coronary artery without angina pectoris: Secondary | ICD-10-CM | POA: Diagnosis not present

## 2014-12-04 DIAGNOSIS — Z23 Encounter for immunization: Secondary | ICD-10-CM | POA: Diagnosis not present

## 2014-12-04 DIAGNOSIS — Z6822 Body mass index (BMI) 22.0-22.9, adult: Secondary | ICD-10-CM | POA: Diagnosis not present

## 2014-12-04 DIAGNOSIS — Z8781 Personal history of (healed) traumatic fracture: Secondary | ICD-10-CM | POA: Diagnosis not present

## 2014-12-04 DIAGNOSIS — K5900 Constipation, unspecified: Secondary | ICD-10-CM | POA: Diagnosis not present

## 2014-12-04 DIAGNOSIS — E039 Hypothyroidism, unspecified: Secondary | ICD-10-CM | POA: Diagnosis not present

## 2014-12-04 DIAGNOSIS — D500 Iron deficiency anemia secondary to blood loss (chronic): Secondary | ICD-10-CM | POA: Diagnosis not present

## 2014-12-04 DIAGNOSIS — M810 Age-related osteoporosis without current pathological fracture: Secondary | ICD-10-CM | POA: Diagnosis not present

## 2015-01-03 DIAGNOSIS — E538 Deficiency of other specified B group vitamins: Secondary | ICD-10-CM | POA: Diagnosis not present

## 2015-01-21 DIAGNOSIS — D225 Melanocytic nevi of trunk: Secondary | ICD-10-CM | POA: Diagnosis not present

## 2015-01-21 DIAGNOSIS — L821 Other seborrheic keratosis: Secondary | ICD-10-CM | POA: Diagnosis not present

## 2015-01-21 DIAGNOSIS — D1801 Hemangioma of skin and subcutaneous tissue: Secondary | ICD-10-CM | POA: Diagnosis not present

## 2015-01-21 DIAGNOSIS — L820 Inflamed seborrheic keratosis: Secondary | ICD-10-CM | POA: Diagnosis not present

## 2015-01-31 DIAGNOSIS — I872 Venous insufficiency (chronic) (peripheral): Secondary | ICD-10-CM | POA: Diagnosis not present

## 2015-01-31 DIAGNOSIS — I359 Nonrheumatic aortic valve disorder, unspecified: Secondary | ICD-10-CM | POA: Diagnosis not present

## 2015-01-31 DIAGNOSIS — F411 Generalized anxiety disorder: Secondary | ICD-10-CM | POA: Diagnosis not present

## 2015-01-31 DIAGNOSIS — M797 Fibromyalgia: Secondary | ICD-10-CM | POA: Diagnosis not present

## 2015-01-31 DIAGNOSIS — R079 Chest pain, unspecified: Secondary | ICD-10-CM | POA: Diagnosis not present

## 2015-01-31 DIAGNOSIS — R0600 Dyspnea, unspecified: Secondary | ICD-10-CM | POA: Diagnosis not present

## 2015-02-05 ENCOUNTER — Ambulatory Visit: Payer: Medicare Other | Attending: Orthopedic Surgery | Admitting: Physical Therapy

## 2015-02-05 DIAGNOSIS — R29898 Other symptoms and signs involving the musculoskeletal system: Secondary | ICD-10-CM | POA: Diagnosis not present

## 2015-02-05 DIAGNOSIS — M25551 Pain in right hip: Secondary | ICD-10-CM | POA: Diagnosis not present

## 2015-02-05 NOTE — Therapy (Signed)
[facility] [place], [place], [address] Phone: [phone]   Fax:  [phone]  Physical Therapy Evaluation  Patient Details  Name: [name] MRN: [id number] Date of Birth: 28-Nov-1926 Referring Provider:  Rod Can, MD  Encounter Date: 02/05/2015      PT End of Session - 02/05/15 1455    Visit Number 1   Number of Visits 12   Date for PT Re-Evaluation 03/19/15   PT Start Time 0211   PT Stop Time 0301   PT Time Calculation (min) 50 min   Activity Tolerance Patient tolerated treatment well   Behavior During Therapy [facility] for tasks assessed/performed      Past Medical History  Diagnosis Date  . Hyperlipidemia   . Irregular heartbeat   . Arthritis   . Fibromyalgia   . CHF (congestive heart failure)   . Coronary artery disease   . Hiatal hernia   . COPD (chronic obstructive pulmonary disease)   . Osteoporosis   . Anxiety   . Depression   . Rotator cuff tear     RIGHT  . Complication of anesthesia     " I have to have a spinal beacuse my lungs & heart are bad "  . UTI (urinary tract infection) 09/2014    Past Surgical History  Procedure Laterality Date  . Appendectomy    . Dilation and curettage of uterus      x2  . Elbow surgery      Right  . Cataract extraction      Left  . Hip arthroplasty Right 09/22/2014    Procedure: ARTHROPLASTY BIPOLAR HIP;  Surgeon: [name], MD;  Location: [place];  Service: Orthopedics;  Laterality: Right;  . Tonsillectomy    . Total hip revision Right 10/13/2014    Procedure: REVISION RIGHT TOTAL HIP POSTERIOR ;  Surgeon: Rod Can, MD;  Location: [place];  Service: Orthopedics;  Laterality: Right;    There were no vitals filed for this visit.  Visit Diagnosis:  Right hip pain - Plan: PT plan of care cert/re-cert  Weakness of right hip - Plan: PT plan of care cert/re-cert          [facility] PT Assessment - 02/05/15 0001    Assessment   Medical Diagnosis  Right hip joint replacement.   Onset Date/Surgical Date --  10/13/14.   Precautions   Precautions --  Hip precautions and fall risk.  No ultrasound.   Restrictions   Weight Bearing Restrictions No   Balance Screen   Has the patient fallen in the past 6 months Yes   How many times? 2   Has the patient had a decrease in activity level because of a fear of falling?  No   Is the patient reluctant to leave their home because of a fear of falling?  No   Home [employer] residence   Prior Function   Level of Independence Independent   Cognition   Overall Cognitive Status Within Functional Limits for tasks assessed   ROM / Strength   AROM / PROM / Strength AROM;Strength   AROM   Overall AROM Comments patient abiding by right hip precautions.   Strength   Overall Strength Comments Right hip abduction= 3- to 3/5.  Right knee strength= 4/5.,   Palpation   Palpation comment Very tender to palpation all along right lateral hip incisional site.   Special Tests    Special Tests --  Equal leg lengths.   Transfers   Transfers Sit to Stand;Stand to Sit   Sit to Stand 6: Modified independent (Device/Increase time)   Stand to Sit 6: Modified independent (Device/Increase time)   Ambulation/Gait   Gait Comments The patient demonstrates a Trendelenburg gait pattern using a FWW.                   [facility] Adult PT Treatment/Exercise - 02/23/15 0001    Modalities   Modalities Electrical Stimulation   Electrical Stimulation   Electrical Stimulation Location Right hip.   Electrical Stimulation Action IFC 80-150 HZ x 15 minutes at 100% scan.   Electrical Stimulation Goals Pain                PT Education - 02/23/15 1454    Education provided Yes   Person(s) Educated Patient   Methods Explanation   Comprehension Verbalized understanding          PT Short Term Goals - 23-Feb-2015 1456    PT SHORT TERM GOAL #1   Title Ind with initial HEP.   Time  2   Period Weeks   Status New           PT Long Term Goals - Feb 23, 2015 1456    PT LONG TERM GOAL #1   Title Ind with advanced HEP.   Time 4   Period Weeks   Status New   PT LONG TERM GOAL #2   Title Right LE strength a solid 4+/5 to increase stability for functional activites.   Time 4   Period Weeks   Status New   PT LONG TERM GOAL #3   Title Walk with FWW without a Trendelburg gait pattern.   Time 4   Period Weeks   Status New   PT LONG TERM GOAL #4   Title Perform ADL's with pain not > 3/10.               Plan - Feb 23, 2015 1423    Clinical Impression Statement Patient fell backwards in March 2016.  The patient fractured her right femur and underwent surgery on 09/22/14.  She then had a revision surgery 10/21/14.  The patient reports pain rising to 6/10 walking and standing.   Pt will benefit from skilled therapeutic intervention in order to improve on the following deficits Abnormal gait;Pain;Decreased activity tolerance;Decreased strength   Rehab Potential Good   PT Frequency 3x / week   PT Duration 4 weeks   PT Treatment/Interventions ADLs/Self Care Home Management;Electrical Stimulation;Moist Heat;Therapeutic activities;Therapeutic exercise;Balance training;Neuromuscular re-education;Patient/family education   PT Next Visit Plan Gait training.  Right hip strengthening.          G-Codes - 02-23-15 1458    Functional Assessment Tool Used FOTO.   Functional Limitation Mobility: Walking and moving around   Mobility: Walking and Moving Around Current Status [phone]) At least 60 percent but less than 80 percent impaired, limited or restricted   Mobility: Walking and Moving Around Goal Status [phone]) At least 20 percent but less than 40 percent impaired, limited or restricted       Problem List Patient Active Problem List   Diagnosis Date Noted  . Peri-prosthetic fracture of femur following total hip arthroplasty 10/13/2014  . Protein-calorie malnutrition,  severe 10/12/2014  . Periprosthetic fracture around internal prosthetic right hip joint 10/12/2014  . COPD exacerbation   . Acute cystitis without hematuria   . Thyroid activity decreased   . Hip fracture 09/21/2014  .  Fall at home 09/21/2014  . Syncope and collapse 07/02/2014  . Osteoporosis 04/10/2013  . Vaginal atrophy 04/10/2013  . Female pelvic pain 04/10/2013  . Hemoptysis, unspecified 10/12/2011  . Bronchiectasis without acute exacerbation 05/19/2011    Keelin Neville, [place] MPT 02/05/2015, 3:01 PM  [facility] [address] [place], [place], [address] Phone: [phone]   Fax:  [phone]

## 2015-02-05 NOTE — Patient Instructions (Signed)
Reviewed hip precautions.

## 2015-02-06 DIAGNOSIS — E538 Deficiency of other specified B group vitamins: Secondary | ICD-10-CM | POA: Diagnosis not present

## 2015-02-07 ENCOUNTER — Ambulatory Visit: Payer: Medicare Other | Admitting: Physical Therapy

## 2015-02-07 DIAGNOSIS — R29898 Other symptoms and signs involving the musculoskeletal system: Secondary | ICD-10-CM

## 2015-02-07 DIAGNOSIS — M25551 Pain in right hip: Secondary | ICD-10-CM

## 2015-02-07 NOTE — Therapy (Signed)
[facility] [place], [place], [address] Phone: [phone]   Fax:  [phone]  Physical Therapy Treatment  Patient Details  Name: [name] MRN: [id number] Date of Birth: 10-Apr-1927 Referring Provider:  [name], MD  Encounter Date: 02/07/2015      PT End of Session - 02/07/15 1402    Visit Number 2   Number of Visits 12   Date for PT Re-Evaluation 03/19/15   PT Start Time 0100   PT Stop Time 0150   PT Time Calculation (min) 50 min   Activity Tolerance Patient tolerated treatment well   Behavior During Therapy [facility] for tasks assessed/performed      Past Medical History  Diagnosis Date  . Hyperlipidemia   . Irregular heartbeat   . Arthritis   . Fibromyalgia   . CHF (congestive heart failure)   . Coronary artery disease   . Hiatal hernia   . COPD (chronic obstructive pulmonary disease)   . Osteoporosis   . Anxiety   . Depression   . Rotator cuff tear     RIGHT  . Complication of anesthesia     " I have to have a spinal beacuse my lungs & heart are bad "  . UTI (urinary tract infection) 09/2014    Past Surgical History  Procedure Laterality Date  . Appendectomy    . Dilation and curettage of uterus      x2  . Elbow surgery      Right  . Cataract extraction      Left  . Hip arthroplasty Right 09/22/2014    Procedure: ARTHROPLASTY BIPOLAR HIP;  Surgeon: [name], MD;  Location: [place];  Service: Orthopedics;  Laterality: Right;  . Tonsillectomy    . Total hip revision Right 10/13/2014    Procedure: REVISION RIGHT TOTAL HIP POSTERIOR ;  Surgeon: Rod Can, MD;  Location: [place];  Service: Orthopedics;  Laterality: Right;    There were no vitals filed for this visit.  Visit Diagnosis:  Right hip pain  Weakness of right hip      Subjective Assessment - 02/07/15 1311    Subjective That last treatment helped.                         [facility] Adult PT  Treatment/Exercise - 02/07/15 1311    Modalities   Modalities Electrical Stimulation   Electrical Stimulation   Electrical Stimulation Location --  Right lateral hip IFC at 100% scan x 15 mins (80-150 HZ).   Manual Therapy   Manual therapy comments STW/M and TP release technique to right lateral thigh x 24 minutes.                  PT Short Term Goals - 02/05/15 1456    PT SHORT TERM GOAL #1   Title Ind with initial HEP.   Time 2   Period Weeks   Status New           PT Long Term Goals - 02/05/15 1456    PT LONG TERM GOAL #1   Title Ind with advanced HEP.   Time 4   Period Weeks   Status New   PT LONG TERM GOAL #2   Title Right LE strength a solid 4+/5 to increase stability for functional activites.   Time 4   Period Weeks   Status New   PT LONG TERM GOAL #3  Title Walk with FWW without a Trendelburg gait pattern.   Time 4   Period Weeks   Status New   PT LONG TERM GOAL #4   Title Perform ADL's with pain not > 3/10.               Problem List Patient Active Problem List   Diagnosis Date Noted  . Peri-prosthetic fracture of femur following total hip arthroplasty 10/13/2014  . Protein-calorie malnutrition, severe 10/12/2014  . Periprosthetic fracture around internal prosthetic right hip joint 10/12/2014  . COPD exacerbation   . Acute cystitis without hematuria   . Thyroid activity decreased   . Hip fracture 09/21/2014  . Fall at home 09/21/2014  . Syncope and collapse 07/02/2014  . Osteoporosis 04/10/2013  . Vaginal atrophy 04/10/2013  . Female pelvic pain 04/10/2013  . Hemoptysis, unspecified 10/12/2011  . Bronchiectasis without acute exacerbation 05/19/2011    Anquan Azzarello, [place] MPT 02/07/2015, 2:05 PM  [facility] [address] [place], [place], [address] Phone: [phone]   Fax:  [phone]

## 2015-02-11 ENCOUNTER — Encounter: Payer: Self-pay | Admitting: Physical Therapy

## 2015-02-11 ENCOUNTER — Ambulatory Visit: Payer: Medicare Other | Admitting: Physical Therapy

## 2015-02-11 DIAGNOSIS — R29898 Other symptoms and signs involving the musculoskeletal system: Secondary | ICD-10-CM

## 2015-02-11 DIAGNOSIS — M25551 Pain in right hip: Secondary | ICD-10-CM | POA: Diagnosis not present

## 2015-02-11 NOTE — Therapy (Signed)
[facility] [place], [place], [address] Phone: [phone]   Fax:  [phone]  Physical Therapy Treatment  Patient Details  Name: [name] MRN: [id number] Date of Birth: 02/24/1927 Referring Provider:  [name], MD  Encounter Date: 02/11/2015      PT End of Session - 02/11/15 1435    Visit Number 3   Number of Visits 12   Date for PT Re-Evaluation 03/19/15   PT Start Time 7341   PT Stop Time 1430   PT Time Calculation (min) 41 min   Activity Tolerance Patient tolerated treatment well   Behavior During Therapy [facility] for tasks assessed/performed      Past Medical History  Diagnosis Date  . Hyperlipidemia   . Irregular heartbeat   . Arthritis   . Fibromyalgia   . CHF (congestive heart failure)   . Coronary artery disease   . Hiatal hernia   . COPD (chronic obstructive pulmonary disease)   . Osteoporosis   . Anxiety   . Depression   . Rotator cuff tear     RIGHT  . Complication of anesthesia     " I have to have a spinal beacuse my lungs & heart are bad "  . UTI (urinary tract infection) 09/2014    Past Surgical History  Procedure Laterality Date  . Appendectomy    . Dilation and curettage of uterus      x2  . Elbow surgery      Right  . Cataract extraction      Left  . Hip arthroplasty Right 09/22/2014    Procedure: ARTHROPLASTY BIPOLAR HIP;  Surgeon: [name], MD;  Location: [place];  Service: Orthopedics;  Laterality: Right;  . Tonsillectomy    . Total hip revision Right 10/13/2014    Procedure: REVISION RIGHT TOTAL HIP POSTERIOR ;  Surgeon: Rod Can, MD;  Location: [place];  Service: Orthopedics;  Laterality: Right;    There were no vitals filed for this visit.  Visit Diagnosis:  Right hip pain  Weakness of right hip      Subjective Assessment - 02/11/15 1406    Subjective States that her RLE was very painful and swollen in her ankle over the weekend.   Currently in Pain?  Yes   Pain Score 8    Pain Location Hip   Pain Orientation Right            [facility] PT Assessment - 02/11/15 0001    Assessment   Medical Diagnosis Right hip joint replacement.   Onset Date/Surgical Date 10/13/14                     [facility] Adult PT Treatment/Exercise - 02/11/15 0001    Exercises   Exercises Knee/Hip   Knee/Hip Exercises: Aerobic   Nustep L3 x13 min   Knee/Hip Exercises: Standing   Hip Abduction AROM;Right;3 sets;10 reps;Knee straight   Hip Extension AROM;Right;3 sets;10 reps;Knee straight   Lateral Step Up Right;2 sets;10 reps;Hand Hold: 2;Step Height: 6"   Forward Step Up Right;2 sets;10 reps;Hand Hold: 2;Step Height: 6"   Knee/Hip Exercises: Seated   Clamshell with TheraBand Yellow  x30 reps   Sit to Sand 20 reps;with UE support   Knee/Hip Exercises: Supine   Bridges AROM;3 sets;10 reps   Straight Leg Raises AROM;Right;1 set;10 reps                  PT Short Term Goals -  02/11/15 1454    PT SHORT TERM GOAL #1   Title Ind with initial HEP.   Time 2   Period Weeks   Status On-going           PT Long Term Goals - 02/11/15 1454    PT LONG TERM GOAL #1   Title Ind with advanced HEP.   Time 4   Period Weeks   Status On-going   PT LONG TERM GOAL #2   Title Right LE strength a solid 4+/5 to increase stability for functional activites.   Time 4   Period Weeks   Status On-going   PT LONG TERM GOAL #3   Title Walk with FWW without a Trendelburg gait pattern.   Time 4   Period Weeks   Status On-going   PT LONG TERM GOAL #4   Title Perform ADL's with pain not > 3/10.   Status On-going               Plan - 02/11/15 1450    Clinical Impression Statement Patient tolerated treatment well although she experienced "pull" in RLE during SLRs and a minimal pull during bridges. Had complaints of knee pain more than hip pain during step ups. Completed exercises fairly well but required miimal verbal cueing during exercises  for technique correction or inital demonstration. Requires UE "push off" during sit/stands to give herself a start although encouraged to use LEs. Continues to ambulate with FWW and requires additional time for exercises and was given a small amount of time between exercises to breath due to COPD. Had red coloration of  B toes and were cold to touch. Experienced 6/10 pain following treatment.   Pt will benefit from skilled therapeutic intervention in order to improve on the following deficits Abnormal gait;Pain;Decreased activity tolerance;Decreased strength   Rehab Potential Good   PT Frequency 3x / week   PT Duration 4 weeks   PT Treatment/Interventions ADLs/Self Care Home Management;Electrical Stimulation;Moist Heat;Therapeutic activities;Therapeutic exercise;Balance training;Neuromuscular re-education;Patient/family education   PT Next Visit Plan Continue R hip strengthening per MPT POC. Consider gait training as strength improves.   Consulted and Agree with Plan of Care Patient        Problem List Patient Active Problem List   Diagnosis Date Noted  . Peri-prosthetic fracture of femur following total hip arthroplasty 10/13/2014  . Protein-calorie malnutrition, severe 10/12/2014  . Periprosthetic fracture around internal prosthetic right hip joint 10/12/2014  . COPD exacerbation   . Acute cystitis without hematuria   . Thyroid activity decreased   . Hip fracture 09/21/2014  . Fall at home 09/21/2014  . Syncope and collapse 07/02/2014  . Osteoporosis 04/10/2013  . Vaginal atrophy 04/10/2013  . Female pelvic pain 04/10/2013  . Hemoptysis, unspecified 10/12/2011  . Bronchiectasis without acute exacerbation 05/19/2011    Elvina Sidle 02/11/2015, 2:56 PM  [facility] [address] [place], [place], [address] Phone: [phone]   Fax:  [phone]

## 2015-02-12 ENCOUNTER — Encounter: Payer: BLUE CROSS/BLUE SHIELD | Admitting: Physical Therapy

## 2015-02-13 DIAGNOSIS — Z471 Aftercare following joint replacement surgery: Secondary | ICD-10-CM | POA: Diagnosis not present

## 2015-02-13 DIAGNOSIS — Z96641 Presence of right artificial hip joint: Secondary | ICD-10-CM | POA: Diagnosis not present

## 2015-02-14 ENCOUNTER — Ambulatory Visit: Payer: Medicare Other | Admitting: Physical Therapy

## 2015-02-14 ENCOUNTER — Encounter: Payer: Self-pay | Admitting: Physical Therapy

## 2015-02-14 DIAGNOSIS — M25551 Pain in right hip: Secondary | ICD-10-CM

## 2015-02-14 DIAGNOSIS — R29898 Other symptoms and signs involving the musculoskeletal system: Secondary | ICD-10-CM | POA: Diagnosis not present

## 2015-02-14 NOTE — Therapy (Signed)
[facility] [place], [place], [address] Phone: [phone]   Fax:  [phone]  Physical Therapy Treatment  Patient Details  Name: [name] MRN: [id number] Date of Birth: 04-Feb-1927 Referring Provider:  [name], MD  Encounter Date: 02/14/2015      PT End of Session - 02/14/15 1348    Visit Number 4   Number of Visits 12   Date for PT Re-Evaluation 03/19/15   PT Start Time 0086   PT Stop Time 1428   PT Time Calculation (min) 43 min   Activity Tolerance Patient tolerated treatment well   Behavior During Therapy [facility] for tasks assessed/performed      Past Medical History  Diagnosis Date  . Hyperlipidemia   . Irregular heartbeat   . Arthritis   . Fibromyalgia   . CHF (congestive heart failure)   . Coronary artery disease   . Hiatal hernia   . COPD (chronic obstructive pulmonary disease)   . Osteoporosis   . Anxiety   . Depression   . Rotator cuff tear     RIGHT  . Complication of anesthesia     " I have to have a spinal beacuse my lungs & heart are bad "  . UTI (urinary tract infection) 09/2014    Past Surgical History  Procedure Laterality Date  . Appendectomy    . Dilation and curettage of uterus      x2  . Elbow surgery      Right  . Cataract extraction      Left  . Hip arthroplasty Right 09/22/2014    Procedure: ARTHROPLASTY BIPOLAR HIP;  Surgeon: [name], MD;  Location: [place];  Service: Orthopedics;  Laterality: Right;  . Tonsillectomy    . Total hip revision Right 10/13/2014    Procedure: REVISION RIGHT TOTAL HIP POSTERIOR ;  Surgeon: Rod Can, MD;  Location: [place];  Service: Orthopedics;  Laterality: Right;    There were no vitals filed for this visit.  Visit Diagnosis:  Right hip pain  Weakness of right hip      Subjective Assessment - 02/14/15 1346    Subjective Reports that she feels "pretty rough" today but took Ibuprofen before she came today. Said that Dr.  Lyla Glassing said she could now put full weight on her leg.   Currently in Pain? Yes   Pain Score 3    Pain Location Hip   Pain Orientation Right;Anterior;Lateral   Pain Descriptors / Indicators Aching;Sore            [facility] PT Assessment - 02/14/15 0001    Assessment   Medical Diagnosis Right hip joint replacement.   Onset Date/Surgical Date 10/13/14   Next MD Visit 05/2015                     [facility] Adult PT Treatment/Exercise - 02/14/15 0001    Knee/Hip Exercises: Aerobic   Nustep L4 x17 min   Knee/Hip Exercises: Standing   Hip Flexion AROM;Both;2 sets;10 reps;Knee bent   Hip Abduction AROM;Right;3 sets;10 reps;Knee straight   Hip Extension AROM;Right;3 sets;10 reps;Knee straight   Lateral Step Up Right;2 sets;10 reps;Hand Hold: 2;Step Height: 6"   Forward Step Up Right;2 sets;10 reps;Hand Hold: 2;Step Height: 6"   Knee/Hip Exercises: Seated   Sit to Sand 20 reps;without UE support   Knee/Hip Exercises: Supine   Bridges AROM;3 sets;10 reps   Straight Leg Raises Strengthening;3 sets;10 reps  PT Short Term Goals - 02/11/15 1454    PT SHORT TERM GOAL #1   Title Ind with initial HEP.   Time 2   Period Weeks   Status On-going           PT Long Term Goals - 02/11/15 1454    PT LONG TERM GOAL #1   Title Ind with advanced HEP.   Time 4   Period Weeks   Status On-going   PT LONG TERM GOAL #2   Title Right LE strength a solid 4+/5 to increase stability for functional activites.   Time 4   Period Weeks   Status On-going   PT LONG TERM GOAL #3   Title Walk with FWW without a Trendelburg gait pattern.   Time 4   Period Weeks   Status On-going   PT LONG TERM GOAL #4   Title Perform ADL's with pain not > 3/10.   Status On-going               Plan - 02/14/15 1435    Clinical Impression Statement Patient did well today during therapy session. Requested a seated rest break during standing hip exercises due to feeling  clammy and needing water. The seated rest break lasted 3 minutes total. Did not express any pain today except during SLR after 15 reps and needed a break due to fatigue. Continues to need minimal verbal cueing and demonstration for exercise technique and form. Did not require UE use for sit/stands. Continues to ambulate into therapy gym with FWW and requires extra time to complete exercises as directed. Continues to have the red discoloration in the B toes and feet. Experienced 2/10 pain following treatment.    Pt will benefit from skilled therapeutic intervention in order to improve on the following deficits Abnormal gait;Pain;Decreased activity tolerance;Decreased strength   Rehab Potential Good   PT Frequency 3x / week   PT Duration 4 weeks   PT Treatment/Interventions ADLs/Self Care Home Management;Electrical Stimulation;Moist Heat;Therapeutic activities;Therapeutic exercise;Balance training;Neuromuscular re-education;Patient/family education   PT Next Visit Plan Continue R hip strengthening per MPT POC. Consider gait training as strength improves. Consider ankle weights to improve strengthening next session.   Consulted and Agree with Plan of Care Patient        Problem List Patient Active Problem List   Diagnosis Date Noted  . Peri-prosthetic fracture of femur following total hip arthroplasty 10/13/2014  . Protein-calorie malnutrition, severe 10/12/2014  . Periprosthetic fracture around internal prosthetic right hip joint 10/12/2014  . COPD exacerbation   . Acute cystitis without hematuria   . Thyroid activity decreased   . Hip fracture 09/21/2014  . Fall at home 09/21/2014  . Syncope and collapse 07/02/2014  . Osteoporosis 04/10/2013  . Vaginal atrophy 04/10/2013  . Female pelvic pain 04/10/2013  . Hemoptysis, unspecified 10/12/2011  . Bronchiectasis without acute exacerbation 05/19/2011    [name], PTA 02/14/2015, 2:42 PM  [facility] [address] [place], [place], [address] Phone: [phone]   Fax:  [phone]

## 2015-02-19 ENCOUNTER — Ambulatory Visit: Payer: BLUE CROSS/BLUE SHIELD | Admitting: Pulmonary Disease

## 2015-02-19 ENCOUNTER — Ambulatory Visit: Payer: Medicare Other | Admitting: Physical Therapy

## 2015-02-19 DIAGNOSIS — M25551 Pain in right hip: Secondary | ICD-10-CM

## 2015-02-19 DIAGNOSIS — R29898 Other symptoms and signs involving the musculoskeletal system: Secondary | ICD-10-CM | POA: Diagnosis not present

## 2015-02-19 NOTE — Therapy (Signed)
[facility] [place], [place], [address] Phone: [phone]   Fax:  [phone]  Physical Therapy Treatment  Patient Details  Name: [name] MRN: [id number] Date of Birth: Aug 09, 1926 Referring Provider:  [name], MD  Encounter Date: 02/19/2015      PT End of Session - 02/19/15 1618    Visit Number 5   Number of Visits 12   Date for PT Re-Evaluation 03/19/15   PT Start Time 0309   PT Stop Time 0400   PT Time Calculation (min) 51 min   Activity Tolerance Patient tolerated treatment well   Behavior During Therapy [facility] for tasks assessed/performed      Past Medical History  Diagnosis Date  . Hyperlipidemia   . Irregular heartbeat   . Arthritis   . Fibromyalgia   . CHF (congestive heart failure)   . Coronary artery disease   . Hiatal hernia   . COPD (chronic obstructive pulmonary disease)   . Osteoporosis   . Anxiety   . Depression   . Rotator cuff tear     RIGHT  . Complication of anesthesia     " I have to have a spinal beacuse my lungs & heart are bad "  . UTI (urinary tract infection) 09/2014    Past Surgical History  Procedure Laterality Date  . Appendectomy    . Dilation and curettage of uterus      x2  . Elbow surgery      Right  . Cataract extraction      Left  . Hip arthroplasty Right 09/22/2014    Procedure: ARTHROPLASTY BIPOLAR HIP;  Surgeon: [name], MD;  Location: [place];  Service: Orthopedics;  Laterality: Right;  . Tonsillectomy    . Total hip revision Right 10/13/2014    Procedure: REVISION RIGHT TOTAL HIP POSTERIOR ;  Surgeon: Rod Can, MD;  Location: [place];  Service: Orthopedics;  Laterality: Right;    There were no vitals filed for this visit.  Visit Diagnosis:  Right hip pain  Weakness of right hip      Subjective Assessment - 02/19/15 1614    Subjective Patient not feeling like doing too much exercise today.  She is sad today, as her niece has cancer and  is undergoing chemotherapy.  Her right hip pain is rated at 5/10 today.   Pain Score 5    Pain Location Hip   Pain Orientation Right   Pain Descriptors / Indicators Aching;Sore                         [facility] Adult PT Treatment/Exercise - 02/19/15 0001    Exercises   Exercises Knee/Hip   Knee/Hip Exercises: Aerobic   Nustep Level 4 x 10 minutes.   Acupuncturist Location Right hamstring   Electrical Stimulation Action Pre-mod e stim at 80-150 HZ x 20 minutes.   Electrical Stimulation Goals Pain   Manual Therapy   Manual therapy comments STW x 13 minutes.                  PT Short Term Goals - 02/11/15 1454    PT SHORT TERM GOAL #1   Title Ind with initial HEP.   Time 2   Period Weeks   Status On-going           PT Long Term Goals - 02/11/15 1454    PT LONG TERM  GOAL #1   Title Ind with advanced HEP.   Time 4   Period Weeks   Status On-going   PT LONG TERM GOAL #2   Title Right LE strength a solid 4+/5 to increase stability for functional activites.   Time 4   Period Weeks   Status On-going   PT LONG TERM GOAL #3   Title Walk with FWW without a Trendelburg gait pattern.   Time 4   Period Weeks   Status On-going   PT LONG TERM GOAL #4   Title Perform ADL's with pain not > 3/10.   Status On-going               Problem List Patient Active Problem List   Diagnosis Date Noted  . Peri-prosthetic fracture of femur following total hip arthroplasty 10/13/2014  . Protein-calorie malnutrition, severe 10/12/2014  . Periprosthetic fracture around internal prosthetic right hip joint 10/12/2014  . COPD exacerbation   . Acute cystitis without hematuria   . Thyroid activity decreased   . Hip fracture 09/21/2014  . Fall at home 09/21/2014  . Syncope and collapse 07/02/2014  . Osteoporosis 04/10/2013  . Vaginal atrophy 04/10/2013  . Female pelvic pain 04/10/2013  . Hemoptysis, unspecified 10/12/2011   . Bronchiectasis without acute exacerbation 05/19/2011    APPLEGATE, [place] MPT 02/19/2015, 4:20 PM  [facility] [address] [place], [place], [address] Phone: [phone]   Fax:  [phone]

## 2015-02-21 ENCOUNTER — Ambulatory Visit: Payer: Medicare Other | Admitting: Physical Therapy

## 2015-02-21 DIAGNOSIS — M25551 Pain in right hip: Secondary | ICD-10-CM

## 2015-02-21 DIAGNOSIS — R29898 Other symptoms and signs involving the musculoskeletal system: Secondary | ICD-10-CM

## 2015-02-21 NOTE — Therapy (Signed)
[facility] [place], [place], [address] Phone: [phone]   Fax:  [phone]  Physical Therapy Treatment  Patient Details  Name: [name] MRN: [id number] Date of Birth: [birth year] Referring Provider:  [name], MD  Encounter Date: 02/21/2015      PT End of Session - 02/21/15 1513    Visit Number 6   Number of Visits 12   Date for PT Re-Evaluation 03/19/15   PT Start Time 0234   PT Stop Time 0319   PT Time Calculation (min) 45 min      Past Medical History  Diagnosis Date  . Hyperlipidemia   . Irregular heartbeat   . Arthritis   . Fibromyalgia   . CHF (congestive heart failure)   . Coronary artery disease   . Hiatal hernia   . COPD (chronic obstructive pulmonary disease)   . Osteoporosis   . Anxiety   . Depression   . Rotator cuff tear     RIGHT  . Complication of anesthesia     " I have to have a spinal beacuse my lungs & heart are bad "  . UTI (urinary tract infection) 09/2014    Past Surgical History  Procedure Laterality Date  . Appendectomy    . Dilation and curettage of uterus      x2  . Elbow surgery      Right  . Cataract extraction      Left  . Hip arthroplasty Right 09/22/2014    Procedure: ARTHROPLASTY BIPOLAR HIP;  Surgeon: [name], MD;  Location: [place];  Service: Orthopedics;  Laterality: Right;  . Tonsillectomy    . Total hip revision Right 10/13/2014    Procedure: REVISION RIGHT TOTAL HIP POSTERIOR ;  Surgeon: Rod Can, MD;  Location: [place];  Service: Orthopedics;  Laterality: Right;    There were no vitals filed for this visit.  Visit Diagnosis:  Right hip pain  Weakness of right hip      Subjective Assessment - 02/21/15 1507    Subjective Still sore in back of leg.   Pain Score 5    Pain Location Hip   Pain Descriptors / Indicators Aching;Sore                         [facility] Adult PT Treatment/Exercise - 02/21/15 0001    Exercises    Exercises Knee/Hip   Knee/Hip Exercises: Aerobic   Nustep Level 4 Nustep x 20 minutes f/b manually resisted right hip abuction in seated x 3 minutes.   Modalities   Modalities Electrical Stimulation   Electrical Stimulation   Electrical Stimulation Location Right lateral hip and hamstring   Electrical Stimulation Action 4 electrode Pre-Mod E stim x 15 minues.                  PT Short Term Goals - 02/21/15 1512    PT SHORT TERM GOAL #1   Time 2   Period Weeks   Status On-going           PT Long Term Goals - 02/21/15 1512    PT LONG TERM GOAL #1   Title Ind with advanced HEP.   Time 4   Period Weeks   Status On-going   PT LONG TERM GOAL #2   Title Right LE strength a solid 4+/5 to increase stability for functional activites.   Time 4   Period Weeks  Status On-going   PT LONG TERM GOAL #3   Title Walk with FWW without a Trendelburg gait pattern.   Time 4   Period Weeks   Status On-going   PT LONG TERM GOAL #4   Title Perform ADL's with pain not > 3/10.   Status On-going               Plan - 02/21/15 1510    Clinical Impression Statement Patient cntinues to need FWW for safe ambulation.  The patient's pain-level remained around a 5/10 this week.  No futher goals met this week.   Pt will benefit from skilled therapeutic intervention in order to improve on the following deficits Abnormal gait;Pain;Decreased activity tolerance;Decreased strength   Rehab Potential Good   PT Frequency 3x / week   PT Duration 4 weeks   PT Treatment/Interventions ADLs/Self Care Home Management;Electrical Stimulation;Moist Heat;Therapeutic activities;Therapeutic exercise;Balance training;Neuromuscular re-education;Patient/family education   PT Next Visit Plan Continue R hip strengthening per MPT POC. Consider gait training as strength improves. Consider ankle weights to improve strengthening next session.   Consulted and Agree with Plan of Care Patient         Problem List Patient Active Problem List   Diagnosis Date Noted  . Peri-prosthetic fracture of femur following total hip arthroplasty 10/13/2014  . Protein-calorie malnutrition, severe 10/12/2014  . Periprosthetic fracture around internal prosthetic right hip joint 10/12/2014  . COPD exacerbation   . Acute cystitis without hematuria   . Thyroid activity decreased   . Hip fracture 09/21/2014  . Fall at home 09/21/2014  . Syncope and collapse 07/02/2014  . Osteoporosis 04/10/2013  . Vaginal atrophy 04/10/2013  . Female pelvic pain 04/10/2013  . Hemoptysis, unspecified 10/12/2011  . Bronchiectasis without acute exacerbation 05/19/2011    Zurie Platas, [place] MPT 02/21/2015, 3:44 PM  [facility] [address] [place], [place], [address] Phone: [phone]   Fax:  [phone]

## 2015-02-26 ENCOUNTER — Ambulatory Visit: Payer: Medicare Other | Admitting: *Deleted

## 2015-02-26 ENCOUNTER — Encounter: Payer: Self-pay | Admitting: *Deleted

## 2015-02-26 DIAGNOSIS — R29898 Other symptoms and signs involving the musculoskeletal system: Secondary | ICD-10-CM | POA: Diagnosis not present

## 2015-02-26 DIAGNOSIS — M25551 Pain in right hip: Secondary | ICD-10-CM

## 2015-02-26 NOTE — Therapy (Signed)
[facility] [place], [place], [address] Phone: [phone]   Fax:  [phone]  Physical Therapy Treatment  Patient Details  Name: [name] MRN: [id number] Date of Birth: 06-12-1927 Referring Provider:  [name], MD  Encounter Date: 02/26/2015      PT End of Session - 02/26/15 1315    Visit Number 7   Number of Visits 12   Date for PT Re-Evaluation 03/19/15   PT Start Time 0814   PT Stop Time 4818   PT Time Calculation (min) 46 min      Past Medical History  Diagnosis Date  . Hyperlipidemia   . Irregular heartbeat   . Arthritis   . Fibromyalgia   . CHF (congestive heart failure)   . Coronary artery disease   . Hiatal hernia   . COPD (chronic obstructive pulmonary disease)   . Osteoporosis   . Anxiety   . Depression   . Rotator cuff tear     RIGHT  . Complication of anesthesia     " I have to have a spinal beacuse my lungs & heart are bad "  . UTI (urinary tract infection) 09/2014    Past Surgical History  Procedure Laterality Date  . Appendectomy    . Dilation and curettage of uterus      x2  . Elbow surgery      Right  . Cataract extraction      Left  . Hip arthroplasty Right 09/22/2014    Procedure: ARTHROPLASTY BIPOLAR HIP;  Surgeon: [name], MD;  Location: [place];  Service: Orthopedics;  Laterality: Right;  . Tonsillectomy    . Total hip revision Right 10/13/2014    Procedure: REVISION RIGHT TOTAL HIP POSTERIOR ;  Surgeon: Rod Can, MD;  Location: [place];  Service: Orthopedics;  Laterality: Right;    There were no vitals filed for this visit.  Visit Diagnosis:  Right hip pain  Weakness of right hip      Subjective Assessment - 02/26/15 1309    Subjective Still sore in back of leg.Rt hip is hurting more today 6/10. Both knees hurt also. and won't be able to do much today   Limitations Walking;Standing;Lifting;House hold activities   Currently in Pain? Yes   Pain  Score 6    Pain Location Hip   Pain Orientation Right   Pain Descriptors / Indicators Aching;Sore   Pain Onset More than a month ago   Pain Frequency Constant   Aggravating Factors  ADL's   Pain Relieving Factors Heat and Estim                         [facility] Adult PT Treatment/Exercise - 02/26/15 0001    Exercises   Exercises Knee/Hip   Knee/Hip Exercises: Aerobic   Nustep Level 3 Nustep x 16 minutes f/b manually resisted right hip abuction in seated x 3 minutes.   Modalities   Modalities Electrical Stimulation   Electrical Stimulation   Electrical Stimulation Location Right lateral hip and hamstring   Electrical Stimulation Action IFC to RT hip x 15 min. 80-'150hz'  hooklying   Electrical Stimulation Goals Pain   Manual Therapy   Manual Therapy Passive ROM  AAROM for RT HIP ABD.and Isometric  ADD.                  PT Short Term Goals - 02/21/15 1512    PT SHORT TERM GOAL #  1   Time 2   Period Weeks   Status On-going           PT Long Term Goals - 02/21/15 1512    PT LONG TERM GOAL #1   Title Ind with advanced HEP.   Time 4   Period Weeks   Status On-going   PT LONG TERM GOAL #2   Title Right LE strength a solid 4+/5 to increase stability for functional activites.   Time 4   Period Weeks   Status On-going   PT LONG TERM GOAL #3   Title Walk with FWW without a Trendelburg gait pattern.   Time 4   Period Weeks   Status On-going   PT LONG TERM GOAL #4   Title Perform ADL's with pain not > 3/10.   Status On-going               Plan - 02/26/15 1315    Clinical Impression Statement Pt not doing as well today and complained of being weaker and having more pain. She was able to tolerate some ex.'s today to help Pt gain strength and endurance in RT LE. She still needs FWW to Ambulate and assistance to go from sit to stand at times. No goals met today   Pt will benefit from skilled therapeutic intervention in order to improve on the  following deficits Abnormal gait;Pain;Decreased activity tolerance;Decreased strength   Rehab Potential Good   PT Frequency 3x / week   PT Duration 4 weeks   PT Treatment/Interventions ADLs/Self Care Home Management;Electrical Stimulation;Moist Heat;Therapeutic activities;Therapeutic exercise;Balance training;Neuromuscular re-education;Patient/family education   PT Next Visit Plan Continue R hip strengthening per MPT POC. Consider gait training as strength improves. Consider ankle weights to improve strengthening next session.if able   Consulted and Agree with Plan of Care Patient        Problem List Patient Active Problem List   Diagnosis Date Noted  . Peri-prosthetic fracture of femur following total hip arthroplasty 10/13/2014  . Protein-calorie malnutrition, severe 10/12/2014  . Periprosthetic fracture around internal prosthetic right hip joint 10/12/2014  . COPD exacerbation   . Acute cystitis without hematuria   . Thyroid activity decreased   . Hip fracture 09/21/2014  . Fall at home 09/21/2014  . Syncope and collapse 07/02/2014  . Osteoporosis 04/10/2013  . Vaginal atrophy 04/10/2013  . Female pelvic pain 04/10/2013  . Hemoptysis, unspecified 10/12/2011  . Bronchiectasis without acute exacerbation 05/19/2011    Kerri Kovacik,CHRIS, PTA 02/26/2015, 1:56 PM  [facility] [address] [place], [place], [address] Phone: [phone]   Fax:  [phone]

## 2015-02-28 ENCOUNTER — Encounter: Payer: Self-pay | Admitting: Physical Therapy

## 2015-02-28 ENCOUNTER — Ambulatory Visit: Payer: Medicare Other | Admitting: Physical Therapy

## 2015-02-28 DIAGNOSIS — R29898 Other symptoms and signs involving the musculoskeletal system: Secondary | ICD-10-CM

## 2015-02-28 DIAGNOSIS — M25551 Pain in right hip: Secondary | ICD-10-CM | POA: Diagnosis not present

## 2015-02-28 NOTE — Therapy (Signed)
[facility] [place], [place], [address] Phone: [phone]   Fax:  [phone]  Physical Therapy Treatment  Patient Details  Name: [name] MRN: [id number] Date of Birth: Nov 11, 1926 Referring Provider:  [name], MD  Encounter Date: 02/28/2015      PT End of Session - 02/28/15 1558    Visit Number 8   Number of Visits 12   Date for PT Re-Evaluation 03/19/15   PT Start Time 2202   PT Stop Time 1647   PT Time Calculation (min) 50 min   Activity Tolerance Patient tolerated treatment well   Behavior During Therapy [facility] for tasks assessed/performed      Past Medical History  Diagnosis Date  . Hyperlipidemia   . Irregular heartbeat   . Arthritis   . Fibromyalgia   . CHF (congestive heart failure)   . Coronary artery disease   . Hiatal hernia   . COPD (chronic obstructive pulmonary disease)   . Osteoporosis   . Anxiety   . Depression   . Rotator cuff tear     RIGHT  . Complication of anesthesia     " I have to have a spinal beacuse my lungs & heart are bad "  . UTI (urinary tract infection) 09/2014    Past Surgical History  Procedure Laterality Date  . Appendectomy    . Dilation and curettage of uterus      x2  . Elbow surgery      Right  . Cataract extraction      Left  . Hip arthroplasty Right 09/22/2014    Procedure: ARTHROPLASTY BIPOLAR HIP;  Surgeon: [name], MD;  Location: [place];  Service: Orthopedics;  Laterality: Right;  . Tonsillectomy    . Total hip revision Right 10/13/2014    Procedure: REVISION RIGHT TOTAL HIP POSTERIOR ;  Surgeon: Rod Can, MD;  Location: [place];  Service: Orthopedics;  Laterality: Right;    There were no vitals filed for this visit.  Visit Diagnosis:  Right hip pain  Weakness of right hip      Subjective Assessment - 02/28/15 1605    Subjective States that her knees are hurting her. States that her cane fell on her leg and now has a "black knot"  on her R ankle and has been flucuating between ice and heat. Reports that her hip is better though. Has been having to take ibuprofen before bed the last two nights in order to sleep. States she may need another rooster comb shot in her knees.   Limitations Walking;Standing;Lifting;House hold activities   Currently in Pain? Yes   Pain Score 6    Pain Location Hip   Pain Orientation Right   Pain Descriptors / Indicators Sore   Pain Onset More than a month ago            [facility] PT Assessment - 02/28/15 0001    Assessment   Medical Diagnosis Right hip joint replacement.   Onset Date/Surgical Date 10/13/14   Next MD Visit 05/2015                     [facility] Adult PT Treatment/Exercise - 02/28/15 0001    Knee/Hip Exercises: Aerobic   Nustep L4 x15 min   Knee/Hip Exercises: Seated   Ball Squeeze x30 reps   Clamshell with TheraBand Red  3x10 reps   Knee/Hip Flexion x30 reps AROM   Modalities   Modalities Electrical Stimulation  Acupuncturist Location R lateral hip   Electrical Stimulation Action IFC   Electrical Stimulation Parameters 1-10 Hz x15 min   Electrical Stimulation Goals Pain   Manual Therapy   Manual Therapy Passive ROM   Passive ROM PROM R hip into flex/ext gently with gentle holds at end range                  PT Short Term Goals - 02/21/15 1512    PT SHORT TERM GOAL #1   Time 2   Period Weeks   Status On-going           PT Long Term Goals - 02/21/15 1512    PT LONG TERM GOAL #1   Title Ind with advanced HEP.   Time 4   Period Weeks   Status On-going   PT LONG TERM GOAL #2   Title Right LE strength a solid 4+/5 to increase stability for functional activites.   Time 4   Period Weeks   Status On-going   PT LONG TERM GOAL #3   Title Walk with FWW without a Trendelburg gait pattern.   Time 4   Period Weeks   Status On-going   PT LONG TERM GOAL #4   Title Perform ADL's with pain not > 3/10.    Status On-going               Plan - 02/28/15 1634    Clinical Impression Statement Patient did fairly well today during treatment although she had knee pain and some hip discomfort during the seated hip flexion. Had no complaints of pain duirng PROM of the R hip into flex/ext today in supine. Normal modalites response noted following removal of the modalties. Continues to ambulate with FWW into therapy gym. No goals met today secondary to decreased strength and pain ratings greater than 3/10.   Pt will benefit from skilled therapeutic intervention in order to improve on the following deficits Abnormal gait;Pain;Decreased activity tolerance;Decreased strength   Rehab Potential Good   PT Duration 4 weeks   PT Treatment/Interventions ADLs/Self Care Home Management;Electrical Stimulation;Moist Heat;Therapeutic activities;Therapeutic exercise;Balance training;Neuromuscular re-education;Patient/family education   PT Next Visit Plan Continue R hip strengthening per MPT POC. Consider gait training as strength improves. Consider ankle weights to improve strengthening next session.if able   Consulted and Agree with Plan of Care Patient        Problem List Patient Active Problem List   Diagnosis Date Noted  . Peri-prosthetic fracture of femur following total hip arthroplasty 10/13/2014  . Protein-calorie malnutrition, severe 10/12/2014  . Periprosthetic fracture around internal prosthetic right hip joint 10/12/2014  . COPD exacerbation   . Acute cystitis without hematuria   . Thyroid activity decreased   . Hip fracture 09/21/2014  . Fall at home 09/21/2014  . Syncope and collapse 07/02/2014  . Osteoporosis 04/10/2013  . Vaginal atrophy 04/10/2013  . Female pelvic pain 04/10/2013  . Hemoptysis, unspecified 10/12/2011  . Bronchiectasis without acute exacerbation 05/19/2011    Wynelle Fanny, PTA 02/28/2015, [facility] [address] [place], [place], [address] Phone: [phone]   Fax:  [phone]

## 2015-03-04 ENCOUNTER — Ambulatory Visit: Payer: Medicare Other | Attending: Orthopedic Surgery | Admitting: Physical Therapy

## 2015-03-04 DIAGNOSIS — M25551 Pain in right hip: Secondary | ICD-10-CM | POA: Insufficient documentation

## 2015-03-04 DIAGNOSIS — R29898 Other symptoms and signs involving the musculoskeletal system: Secondary | ICD-10-CM | POA: Insufficient documentation

## 2015-03-04 NOTE — Therapy (Signed)
[facility] [place], [place], [address] Phone: [phone]   Fax:  [phone]  Physical Therapy Treatment  Patient Details  Name: MANAAL MANDALA MRN: [id number] Date of Birth: June 14, 1927 Referring Provider:  [name], MD  Encounter Date: 03/04/2015      PT End of Session - 03/04/15 1618    Visit Number 9   Number of Visits 12   Date for PT Re-Evaluation 03/19/15   PT Start Time 0230   PT Stop Time 0321   PT Time Calculation (min) 51 min   Activity Tolerance Patient tolerated treatment well   Behavior During Therapy [facility] for tasks assessed/performed      Past Medical History  Diagnosis Date  . Hyperlipidemia   . Irregular heartbeat   . Arthritis   . Fibromyalgia   . CHF (congestive heart failure)   . Coronary artery disease   . Hiatal hernia   . COPD (chronic obstructive pulmonary disease)   . Osteoporosis   . Anxiety   . Depression   . Rotator cuff tear     RIGHT  . Complication of anesthesia     " I have to have a spinal beacuse my lungs & heart are bad "  . UTI (urinary tract infection) 09/2014    Past Surgical History  Procedure Laterality Date  . Appendectomy    . Dilation and curettage of uterus      x2  . Elbow surgery      Right  . Cataract extraction      Left  . Hip arthroplasty Right 09/22/2014    Procedure: ARTHROPLASTY BIPOLAR HIP;  Surgeon: [name], MD;  Location: [place];  Service: Orthopedics;  Laterality: Right;  . Tonsillectomy    . Total hip revision Right 10/13/2014    Procedure: REVISION RIGHT TOTAL HIP POSTERIOR ;  Surgeon: Rod Can, MD;  Location: [place];  Service: Orthopedics;  Laterality: Right;    There were no vitals filed for this visit.  Visit Diagnosis:  Right hip pain  Weakness of right hip      Subjective Assessment - 03/04/15 1617    Subjective Can't exercise today as my knee hurt too bad.   Pain Score 6    Pain Location Hip   Pain Orientation  Right   Pain Descriptors / Indicators Sore   Pain Onset More than a month ago                         [facility] Adult PT Treatment/Exercise - 03/04/15 0001    Modalities   Modalities Electrical Stimulation   Electrical Stimulation   Electrical Stimulation Location Right lateral hip   Electrical Stimulation Action IFC   Electrical Stimulation Parameters 80-150 HZ at 100% scan x 20 minutes.   Electrical Stimulation Goals Pain   Manual Therapy   Manual therapy comments STW/M to affected right lateral lhip area x 23 minutes.                  PT Short Term Goals - 02/21/15 1512    PT SHORT TERM GOAL #1   Time 2   Period Weeks   Status On-going           PT Long Term Goals - 02/21/15 1512    PT LONG TERM GOAL #1   Title Ind with advanced HEP.   Time 4   Period Weeks   Status On-going  PT LONG TERM GOAL #2   Title Right LE strength a solid 4+/5 to increase stability for functional activites.   Time 4   Period Weeks   Status On-going   PT LONG TERM GOAL #3   Title Walk with FWW without a Trendelburg gait pattern.   Time 4   Period Weeks   Status On-going   PT LONG TERM GOAL #4   Title Perform ADL's with pain not > 3/10.   Status On-going               Problem List Patient Active Problem List   Diagnosis Date Noted  . Peri-prosthetic fracture of femur following total hip arthroplasty 10/13/2014  . Protein-calorie malnutrition, severe 10/12/2014  . Periprosthetic fracture around internal prosthetic right hip joint 10/12/2014  . COPD exacerbation   . Acute cystitis without hematuria   . Thyroid activity decreased   . Hip fracture 09/21/2014  . Fall at home 09/21/2014  . Syncope and collapse 07/02/2014  . Osteoporosis 04/10/2013  . Vaginal atrophy 04/10/2013  . Female pelvic pain 04/10/2013  . Hemoptysis, unspecified 10/12/2011  . Bronchiectasis without acute exacerbation 05/19/2011    Tulip Meharg, [place] MPT 03/04/2015, 4:26  PM  [facility] [address] [place], [place], [address] Phone: [phone]   Fax:  [phone]

## 2015-03-06 ENCOUNTER — Ambulatory Visit: Payer: Medicare Other | Admitting: Physical Therapy

## 2015-03-07 DIAGNOSIS — E538 Deficiency of other specified B group vitamins: Secondary | ICD-10-CM | POA: Diagnosis not present

## 2015-03-11 ENCOUNTER — Ambulatory Visit: Payer: Medicare Other | Admitting: Physical Therapy

## 2015-03-11 DIAGNOSIS — R29898 Other symptoms and signs involving the musculoskeletal system: Secondary | ICD-10-CM

## 2015-03-11 DIAGNOSIS — M25551 Pain in right hip: Secondary | ICD-10-CM | POA: Diagnosis not present

## 2015-03-11 NOTE — Therapy (Signed)
[facility] [place], [place], [address] Phone: [phone]   Fax:  [phone]  Physical Therapy Treatment  Patient Details  Name: [name] MRN: [id number] Date of Birth: 15-Jun-1927 Referring Provider:  [name], MD  Encounter Date: 03/11/2015      PT End of Session - 03/11/15 1530    Visit Number 10   Number of Visits 12   Date for PT Re-Evaluation 03/19/15   PT Start Time 0230   PT Stop Time 0322   PT Time Calculation (min) 52 min   Activity Tolerance Patient tolerated treatment well   Behavior During Therapy [facility] for tasks assessed/performed      Past Medical History  Diagnosis Date  . Hyperlipidemia   . Irregular heartbeat   . Arthritis   . Fibromyalgia   . CHF (congestive heart failure)   . Coronary artery disease   . Hiatal hernia   . COPD (chronic obstructive pulmonary disease)   . Osteoporosis   . Anxiety   . Depression   . Rotator cuff tear     RIGHT  . Complication of anesthesia     " I have to have a spinal beacuse my lungs & heart are bad "  . UTI (urinary tract infection) 09/2014    Past Surgical History  Procedure Laterality Date  . Appendectomy    . Dilation and curettage of uterus      x2  . Elbow surgery      Right  . Cataract extraction      Left  . Hip arthroplasty Right 09/22/2014    Procedure: ARTHROPLASTY BIPOLAR HIP;  Surgeon: [name], MD;  Location: [place];  Service: Orthopedics;  Laterality: Right;  . Tonsillectomy    . Total hip revision Right 10/13/2014    Procedure: REVISION RIGHT TOTAL HIP POSTERIOR ;  Surgeon: Rod Can, MD;  Location: [place];  Service: Orthopedics;  Laterality: Right;    There were no vitals filed for this visit.  Visit Diagnosis:  Right hip pain  Weakness of right hip                       [facility] Adult PT Treatment/Exercise - 03/11/15 0001    Knee/Hip Exercises: Aerobic   Nustep Nustep x 20 minutes.   Knee/Hip Exercises: Sidelying   Hip ABduction Limitations Supine manually resisted right hip abduction for3 minutes.   Modalities   Modalities [employer] Location Right lateral thih   Electrical Stimulation Action Pre-mod constant x 20 minutes.   Electrical Stimulation Goals Pain                  PT Short Term Goals - 02/21/15 1512    PT SHORT TERM GOAL #1   Time 2   Period Weeks   Status On-going           PT Long Term Goals - 02/21/15 1512    PT LONG TERM GOAL #1   Title Ind with advanced HEP.   Time 4   Period Weeks   Status On-going   PT LONG TERM GOAL #2   Title Right LE strength a solid 4+/5 to increase stability for functional activites.   Time 4   Period Weeks   Status On-going   PT LONG TERM GOAL #3   Title Walk with FWW without a Trendelburg gait pattern.   Time 4  Period Weeks   Status On-going   PT LONG TERM GOAL #4   Title Perform ADL's with pain not > 3/10.   Status On-going                 G-Codes - 2015-04-08 1448    Functional Assessment Tool Used FOTO.   Functional Limitation Mobility: Walking and moving around   Mobility: Walking and Moving Around Current Status [phone]) At least 60 percent but less than 80 percent impaired, limited or restricted   Mobility: Walking and Moving Around Goal Status [phone]) At least 20 percent but less than 40 percent impaired, limited or restricted      Problem List Patient Active Problem List   Diagnosis Date Noted  . Peri-prosthetic fracture of femur following total hip arthroplasty 10/13/2014  . Protein-calorie malnutrition, severe 10/12/2014  . Periprosthetic fracture around internal prosthetic right hip joint 10/12/2014  . COPD exacerbation   . Acute cystitis without hematuria   . Thyroid activity decreased   . Hip fracture 09/21/2014  . Fall at home 09/21/2014  . Syncope and collapse 07/02/2014  . Osteoporosis 04/10/2013   . Vaginal atrophy 04/10/2013  . Female pelvic pain 04/10/2013  . Hemoptysis, unspecified 10/12/2011  . Bronchiectasis without acute exacerbation 05/19/2011    Leanore Biggers, [place]  MPT  04/08/2015, 3:31 PM  [facility] [address] [place], [place], [address] Phone: [phone]   Fax:  [phone]

## 2015-03-13 ENCOUNTER — Encounter: Payer: BLUE CROSS/BLUE SHIELD | Admitting: Physical Therapy

## 2015-03-20 ENCOUNTER — Ambulatory Visit: Payer: BLUE CROSS/BLUE SHIELD | Admitting: Pulmonary Disease

## 2015-03-21 ENCOUNTER — Ambulatory Visit: Payer: Medicare Other | Admitting: *Deleted

## 2015-03-21 ENCOUNTER — Encounter: Payer: Self-pay | Admitting: *Deleted

## 2015-03-21 DIAGNOSIS — M25551 Pain in right hip: Secondary | ICD-10-CM

## 2015-03-21 DIAGNOSIS — R29898 Other symptoms and signs involving the musculoskeletal system: Secondary | ICD-10-CM | POA: Diagnosis not present

## 2015-03-21 NOTE — Therapy (Addendum)
[facility] [place], [place], [address] Phone: [phone]   Fax:  [phone]  Physical Therapy Treatment  Patient Details  Name: [name] MRN: [id number] Date of Birth: Feb 25, 1927 Referring Provider:  [name], MD  Encounter Date: 03/21/2015    Past Medical History:  Diagnosis Date  . Anxiety   . Arthritis   . CHF (congestive heart failure) ([place])   . Complication of anesthesia    " I have to have a spinal beacuse my lungs & heart are bad "  . COPD (chronic obstructive pulmonary disease) ([place])   . Coronary artery disease   . Depression   . Fibromyalgia   . Hiatal hernia   . Hyperlipidemia   . Irregular heartbeat   . Osteoporosis   . Rotator cuff tear    RIGHT  . UTI (urinary tract infection) 09/2014    Past Surgical History:  Procedure Laterality Date  . APPENDECTOMY    . CATARACT EXTRACTION     Left  . DILATION AND CURETTAGE OF UTERUS     x2  . ELBOW SURGERY     Right  . HIP ARTHROPLASTY Right 09/22/2014   Procedure: ARTHROPLASTY BIPOLAR HIP;  Surgeon: [name], MD;  Location: [place];  Service: Orthopedics;  Laterality: Right;  . TONSILLECTOMY    . TOTAL HIP REVISION Right 10/13/2014   Procedure: REVISION RIGHT TOTAL HIP POSTERIOR ;  Surgeon: Rod Can, MD;  Location: [facility];  Service: Orthopedics;  Laterality: Right;    There were no vitals filed for this visit.  Visit Diagnosis:  Right hip pain  Weakness of right hip                                 PT Short Term Goals - 03/21/15 1516      PT SHORT TERM GOAL #1   Title Ind with initial HEP.   Status Achieved           PT Long Term Goals - 02/21/15 1512      PT LONG TERM GOAL #1   Title Ind with advanced HEP.   Time 4   Period Weeks   Status On-going     PT LONG TERM GOAL #2   Title Right LE strength a solid 4+/5 to increase stability for functional activites.   Time 4   Period Weeks    Status On-going     PT LONG TERM GOAL #3   Title Walk with FWW without a Trendelburg gait pattern.   Time 4   Period Weeks   Status On-going     PT LONG TERM GOAL #4   Title Perform ADL's with pain not > 3/10.   Status On-going               Problem List Patient Active Problem List   Diagnosis Date Noted  . Peri-prosthetic fracture of femur following total hip arthroplasty 10/13/2014  . Protein-calorie malnutrition, severe ([place]) 10/12/2014  . Periprosthetic fracture around internal prosthetic right hip joint ([place]) 10/12/2014  . COPD exacerbation ([place])   . Acute cystitis without hematuria   . Thyroid activity decreased   . Hip fracture ([place]) 09/21/2014  . Fall at home 09/21/2014  . Syncope and collapse 07/02/2014  . Osteoporosis 04/10/2013  . Vaginal atrophy 04/10/2013  . Female pelvic pain 04/10/2013  . Hemoptysis, unspecified 10/12/2011  . Bronchiectasis without acute exacerbation (  [place]) 05/19/2011    APPLEGATE, [place], PTA 05/18/2016, 7:48 PM  [facility] [address] [place], [place], [address] Phone: [phone]   Fax:  [phone]   PHYSICAL THERAPY DISCHARGE SUMMARY  Visits from Start of Care: 11.  Current functional level related to goals / functional outcomes: Please see above.   Remaining deficits: Continued right hip pain and loss of strength.   Education / Equipment: HEP. Plan: Patient agrees to discharge.  Patient goals were not met. Patient is being discharged due to not returning since the last visit.  ?????         [place] Applegate MPT

## 2015-03-25 ENCOUNTER — Encounter: Payer: Self-pay | Admitting: Pulmonary Disease

## 2015-03-25 ENCOUNTER — Ambulatory Visit (INDEPENDENT_AMBULATORY_CARE_PROVIDER_SITE_OTHER): Payer: Medicare Other | Admitting: Pulmonary Disease

## 2015-03-25 VITALS — BP 132/70 | HR 86 | Ht 67.0 in | Wt 141.0 lb

## 2015-03-25 DIAGNOSIS — J479 Bronchiectasis, uncomplicated: Secondary | ICD-10-CM

## 2015-03-25 NOTE — Patient Instructions (Signed)
We will arrange an overnight oximetry test Please provide [place] with a sample of mucus Make sure you use her flutter valve 2-3 times a day We will see you back in 6 months or sooner if needed

## 2015-03-25 NOTE — Progress Notes (Signed)
Subjective:    Patient ID: [name], female    DOB: August 13, 1926, 81 y.o.   MRN: [id number]   Synopsis: Former patient of Dr. Gwenette Day with bronchiectasis CT chest 2012:  Bronchiectasis in RML, lingula, LLL. PFT's 06/2011: +airtrapping, no restriction, DLCO 76% pred.  Sputum 06/2011: normal flora, no AFB seen Serum Ig's 2012: normal levels.  No change in breathing or mucus with a trial of arcapta or dulera.  Sputum CS 04/2012:  pansensitive pseudomonas  HPI Chief Complaint  Patient presents with  . Follow-up    former [facility] pt being treated for bronchiectasis.  pt c/o prod cough with "garbage" colored mucus.     [name] says that she was born with respiratory problems.  She recalls having asthma as a child.  She notes that at time she will cough up blood, this has been an issue over the year.  She has coughing spells from time to time and produces mucus on a daily basis.  She says that she produces sputum every day, at least 4 tablespoons a day.  She produces brown, purulent mucus.  She says that the albuterol helps when she has coughing spells.  She uses it 2-7 times per week, some weeks are better than.  She has been admitted to the hospital for a bronchiectasis flares and pneumonia many times over the years.  The most recent bronchiectasis flare was in a year.  She fell and broke her hip recently.    Past Medical History  Diagnosis Date  . Hyperlipidemia   . Irregular heartbeat   . Arthritis   . Fibromyalgia   . CHF (congestive heart failure)   . Coronary artery disease   . Hiatal hernia   . COPD (chronic obstructive pulmonary disease)   . Osteoporosis   . Anxiety   . Depression   . Rotator cuff tear     RIGHT  . Complication of anesthesia     " I have to have a spinal beacuse my lungs & heart are bad "  . UTI (urinary tract infection) 09/2014      Review of Systems     Objective:   Physical Exam Filed Vitals:   03/25/15 1402  BP: 132/70  Pulse: 86  Height: 5\' 7"   (1.702 m)  Weight: 141 lb (63.957 kg)  SpO2: 97%   Gen: well appearing HENT: OP clear, TM's clear, neck supple PULM: Crackles bases, wheeze left, good air movement CV: RRR, no mgr, trace edema GI: BS+, soft, nontender Derm: no cyanosis or rash Psyche: normal mood and affect       Assessment & Plan:  Bronchiectasis without acute exacerbation She only had mild airflow obstruction but she has severe symptoms due to her age and multiple comorbid illnesses.  Her biggest issue is mucus burden and cough with mucus production.     Plan: We re-iterated the importance of daily mucociliary clearance, good nutrition, and attention to her bacterial infections We will get a sputum culture today She is encouraged use of flutter valve 2-3 times a day Get a flu shot in the fall Follow-up in 6 months or sooner if needed    Current outpatient prescriptions:  .  acetaminophen (TYLENOL) 500 MG tablet, Take 2 tablets (1,000 mg total) by mouth every 8 (eight) hours., Disp: 30 tablet, Rfl: 0 .  albuterol (PROVENTIL HFA;VENTOLIN HFA) 108 (90 BASE) MCG/ACT inhaler, Inhale 2 puffs into the lungs every 6 (six) hours as needed. (Patient taking differently: Inhale 2  puffs into the lungs every 6 (six) hours as needed for shortness of breath. ), Disp: 1 Inhaler, Rfl: 5 .  alum & mag hydroxide-simeth (MAALOX PLUS) 400-400-40 MG/5ML suspension, Take 30 mLs by mouth every 6 (six) hours as needed for indigestion., Disp: , Rfl:  .  Cholecalciferol (VITAMIN D3) 1000 UNITS CAPS, Take 1 capsule by mouth daily.  , Disp: , Rfl:  .  cyanocobalamin (,VITAMIN B-12,) 1000 MCG/ML injection, Inject 1,000 mcg into the muscle every 30 (thirty) days., Disp: , Rfl:  .  docusate sodium (COLACE) 100 MG capsule, Take 1 capsule (100 mg total) by mouth 2 (two) times daily., Disp: 10 capsule, Rfl: 0 .  enoxaparin (LOVENOX) 40 MG/0.4ML injection, Inject 0.4 mLs (40 mg total) into the skin daily., Disp: 30 Syringe, Rfl: 0 .   hydrocortisone (ANUSOL-HC) 25 MG suppository, Place 25 mg rectally as needed for hemorrhoids. , Disp: , Rfl:  .  hydroxypropyl methylcellulose (ISOPTO TEARS) 2.5 % ophthalmic solution, Place 1 drop into both eyes as needed for dry eyes. , Disp: , Rfl:  .  lansoprazole (PREVACID) 15 MG capsule, Take 15 mg by mouth every morning. , Disp: , Rfl:  .  levothyroxine (SYNTHROID, LEVOTHROID) 75 MCG tablet, Take 75 mcg by mouth daily.  , Disp: , Rfl:  .  Multiple Vitamin (MULTIVITAMIN) capsule, Take 1 capsule by mouth daily.  , Disp: , Rfl:  .  nitrofurantoin, macrocrystal-monohydrate, (MACROBID) 100 MG capsule, Take 1 capsule (100 mg total) by mouth 2 (two) times daily., Disp: 8 capsule, Rfl: 0 .  nitroGLYCERIN (NITROSTAT) 0.4 MG SL tablet, Place 0.4 mg under the tongue every 5 (five) minutes as needed for chest pain. , Disp: , Rfl:  .  OXYGEN, Inhale 3 L into the lungs at bedtime. , Disp: , Rfl:  .  Polyethyl Glycol-Propyl Glycol (SYSTANE) 0.4-0.3 % SOLN, Apply 1 drop to eye 3 (three) times daily. , Disp: , Rfl:  .  polyethylene glycol (MIRALAX / GLYCOLAX) packet, Take 17 g by mouth daily as needed for mild constipation. , Disp: , Rfl:  .  senna-docusate (SENOKOT-S) 8.6-50 MG per tablet, Take 1 tablet by mouth at bedtime., Disp: , Rfl:  .  traMADol (ULTRAM) 50 MG tablet, Take 1 tablet (50 mg total) by mouth every 6 (six) hours as needed for moderate pain., Disp: 80 tablet, Rfl: 0

## 2015-03-25 NOTE — Assessment & Plan Note (Addendum)
She only had mild airflow obstruction but she has severe symptoms due to her age and multiple comorbid illnesses.  Her biggest issue is mucus burden and cough with mucus production.     Plan: We re-iterated the importance of daily mucociliary clearance, good nutrition, and attention to her bacterial infections We will get a sputum culture today She is encouraged use of flutter valve 2-3 times a day Get a flu shot in the fall Follow-up in 6 months or sooner if needed

## 2015-03-26 ENCOUNTER — Encounter: Payer: BLUE CROSS/BLUE SHIELD | Admitting: Physical Therapy

## 2015-04-02 DIAGNOSIS — J449 Chronic obstructive pulmonary disease, unspecified: Secondary | ICD-10-CM | POA: Diagnosis not present

## 2015-04-03 DIAGNOSIS — M1711 Unilateral primary osteoarthritis, right knee: Secondary | ICD-10-CM | POA: Diagnosis not present

## 2015-04-03 DIAGNOSIS — Z471 Aftercare following joint replacement surgery: Secondary | ICD-10-CM | POA: Diagnosis not present

## 2015-04-03 DIAGNOSIS — Z96641 Presence of right artificial hip joint: Secondary | ICD-10-CM | POA: Diagnosis not present

## 2015-04-03 DIAGNOSIS — M170 Bilateral primary osteoarthritis of knee: Secondary | ICD-10-CM | POA: Diagnosis not present

## 2015-04-03 DIAGNOSIS — M1712 Unilateral primary osteoarthritis, left knee: Secondary | ICD-10-CM | POA: Diagnosis not present

## 2015-04-09 ENCOUNTER — Encounter: Payer: BLUE CROSS/BLUE SHIELD | Admitting: *Deleted

## 2015-04-10 ENCOUNTER — Telehealth: Payer: Self-pay

## 2015-04-10 DIAGNOSIS — R0902 Hypoxemia: Secondary | ICD-10-CM

## 2015-04-10 NOTE — Telephone Encounter (Signed)
lmtcb X1 for pt.  Will order 02 and ONO after speaking to pt.

## 2015-04-10 NOTE — Telephone Encounter (Signed)
-----   Message from Juanito Doom, MD sent at 04/10/2015  1:32 PM EDT ----- A, Please let her know that her ONO showed that her O2 level was low when she sleeps.  I want her to start 2L qHS and repeat the ONO. Thanks B

## 2015-04-11 DIAGNOSIS — J449 Chronic obstructive pulmonary disease, unspecified: Secondary | ICD-10-CM | POA: Diagnosis not present

## 2015-04-11 DIAGNOSIS — J479 Bronchiectasis, uncomplicated: Secondary | ICD-10-CM | POA: Diagnosis not present

## 2015-04-11 DIAGNOSIS — D500 Iron deficiency anemia secondary to blood loss (chronic): Secondary | ICD-10-CM | POA: Diagnosis not present

## 2015-04-11 DIAGNOSIS — F418 Other specified anxiety disorders: Secondary | ICD-10-CM | POA: Diagnosis not present

## 2015-04-11 DIAGNOSIS — E785 Hyperlipidemia, unspecified: Secondary | ICD-10-CM | POA: Diagnosis not present

## 2015-04-11 DIAGNOSIS — E538 Deficiency of other specified B group vitamins: Secondary | ICD-10-CM | POA: Diagnosis not present

## 2015-04-11 DIAGNOSIS — Z1389 Encounter for screening for other disorder: Secondary | ICD-10-CM | POA: Diagnosis not present

## 2015-04-11 DIAGNOSIS — Z23 Encounter for immunization: Secondary | ICD-10-CM | POA: Diagnosis not present

## 2015-04-11 DIAGNOSIS — E039 Hypothyroidism, unspecified: Secondary | ICD-10-CM | POA: Diagnosis not present

## 2015-04-11 DIAGNOSIS — M810 Age-related osteoporosis without current pathological fracture: Secondary | ICD-10-CM | POA: Diagnosis not present

## 2015-04-11 DIAGNOSIS — M797 Fibromyalgia: Secondary | ICD-10-CM | POA: Diagnosis not present

## 2015-04-11 DIAGNOSIS — Z6822 Body mass index (BMI) 22.0-22.9, adult: Secondary | ICD-10-CM | POA: Diagnosis not present

## 2015-04-11 NOTE — Telephone Encounter (Signed)
Pt returned call [phone]

## 2015-04-11 NOTE — Telephone Encounter (Signed)
ATC pt received fast busy signal x 2 wcb

## 2015-04-15 NOTE — Telephone Encounter (Signed)
Spoke with pt, aware of results and recs.  Orders placed.  Nothing further needed.

## 2015-05-06 DIAGNOSIS — M1712 Unilateral primary osteoarthritis, left knee: Secondary | ICD-10-CM | POA: Diagnosis not present

## 2015-05-08 DIAGNOSIS — M1712 Unilateral primary osteoarthritis, left knee: Secondary | ICD-10-CM | POA: Diagnosis not present

## 2015-05-15 ENCOUNTER — Other Ambulatory Visit: Payer: Medicare Other

## 2015-05-15 DIAGNOSIS — M1712 Unilateral primary osteoarthritis, left knee: Secondary | ICD-10-CM | POA: Diagnosis not present

## 2015-05-15 DIAGNOSIS — J479 Bronchiectasis, uncomplicated: Secondary | ICD-10-CM | POA: Diagnosis not present

## 2015-05-19 LAB — LOWER RESPIRATORY CULTURE

## 2015-05-22 DIAGNOSIS — M1712 Unilateral primary osteoarthritis, left knee: Secondary | ICD-10-CM | POA: Diagnosis not present

## 2015-05-22 DIAGNOSIS — M25562 Pain in left knee: Secondary | ICD-10-CM | POA: Diagnosis not present

## 2015-06-03 ENCOUNTER — Telehealth: Payer: Self-pay | Admitting: Pulmonary Disease

## 2015-06-03 ENCOUNTER — Other Ambulatory Visit (INDEPENDENT_AMBULATORY_CARE_PROVIDER_SITE_OTHER): Payer: Medicare Other

## 2015-06-03 ENCOUNTER — Ambulatory Visit (INDEPENDENT_AMBULATORY_CARE_PROVIDER_SITE_OTHER): Payer: Medicare Other | Admitting: Pulmonary Disease

## 2015-06-03 ENCOUNTER — Encounter: Payer: Self-pay | Admitting: Pulmonary Disease

## 2015-06-03 ENCOUNTER — Ambulatory Visit (INDEPENDENT_AMBULATORY_CARE_PROVIDER_SITE_OTHER)
Admission: RE | Admit: 2015-06-03 | Discharge: 2015-06-03 | Disposition: A | Discharge status: Home / Self Care | Payer: Medicare Other | Source: Ambulatory Visit | Attending: Pulmonary Disease | Admitting: Pulmonary Disease

## 2015-06-03 VITALS — BP 118/72 | HR 78 | Ht 67.0 in | Wt 143.6 lb

## 2015-06-03 DIAGNOSIS — R079 Chest pain, unspecified: Secondary | ICD-10-CM | POA: Diagnosis not present

## 2015-06-03 DIAGNOSIS — R0602 Shortness of breath: Secondary | ICD-10-CM

## 2015-06-03 DIAGNOSIS — J479 Bronchiectasis, uncomplicated: Secondary | ICD-10-CM

## 2015-06-03 LAB — CBC WITH DIFFERENTIAL/PLATELET
BASOS ABS: 0.1 10*3/uL (ref 0.0–0.1)
Basophils Relative: 1 % (ref 0.0–3.0)
EOS PCT: 2.8 % (ref 0.0–5.0)
Eosinophils Absolute: 0.2 10*3/uL (ref 0.0–0.7)
HEMATOCRIT: 37.7 % (ref 36.0–46.0)
Hemoglobin: 12.5 g/dL (ref 12.0–15.0)
LYMPHS ABS: 1.6 10*3/uL (ref 0.7–4.0)
LYMPHS PCT: 21.7 % (ref 12.0–46.0)
MCHC: 33.1 g/dL (ref 30.0–36.0)
MCV: 95 fl (ref 78.0–100.0)
MONOS PCT: 10.4 % (ref 3.0–12.0)
Monocytes Absolute: 0.8 10*3/uL (ref 0.1–1.0)
NEUTROS ABS: 4.7 10*3/uL (ref 1.4–7.7)
NEUTROS PCT: 64.1 % (ref 43.0–77.0)
PLATELETS: 243 10*3/uL (ref 150.0–400.0)
RBC: 3.96 Mil/uL (ref 3.87–5.11)
RDW: 14.7 % (ref 11.5–15.5)
WBC: 7.4 10*3/uL (ref 4.0–10.5)

## 2015-06-03 MED ORDER — FUROSEMIDE 20 MG PO TABS: 20.0000 mg | ORAL_TABLET | Freq: Every day | ORAL | Status: DC

## 2015-06-03 NOTE — Telephone Encounter (Signed)
Symptoms worrisome for heart failure, for which I have never treated her (she sees [place] for bronchiectasis).  As such she needs an office visit.  Please schedule acute visit ASAP with anyone.

## 2015-06-03 NOTE — Telephone Encounter (Signed)
Called and spoke to pt. Pt c/p BIL lower extremity edema. Pt stated she informed her PCP, Dr. Forde Dandy, and was advised to call here. Pt c/o slight in SOB, BIL LE edema, and states she is having a hard time bearing weight x 2-3 days. Pt denies discoloration in lower extremities, weeping, CP/tightness, f/c/s.   Dr. Lake Bells please advise. Thanks.

## 2015-06-03 NOTE — Progress Notes (Signed)
Subjective:    Patient ID: [name], female    DOB: June 18, 1927, 81 y.o.   MRN: [id number]  HPI Acute visit for evaluation of dyspnea, lower extremity edema.  Mrs. Rosana Hoes is a 81 year old with history of bronchiectasis, diastolic heart failure. She complains of increasing lower extremity edema and shortness of breath for the past 2 days. She denies any cough, sputum production, wheezing, hemoptysis. She does not have any chest pain or palpitations. She found some old Lasix from 2009 and took half a pill twice. She does not know what dose she took. She reports that she had a good response with increased urine output and improved lower extremity edema and dyspnea. She is not on Lasix routinely as an outpatient.  DATA: Echo (07/03/14) - Mild LVH with LVEF 82-95%, grade 2 diastolic dysfunction. Mild left atrial enlargement. MAC with trivial mitral regurgitation. Mild aortic regurgitation. Trivial tricuspid regurgitation with mildly increased PASP 38 mmHg.  Sp cx (10/12 16) Normal flora.  EKG (06/03/15) NSR. Non specific T wave changes.   Past Medical History  Diagnosis Date  . Hyperlipidemia   . Irregular heartbeat   . Arthritis   . Fibromyalgia   . CHF (congestive heart failure) ([place])   . Coronary artery disease   . Hiatal hernia   . COPD (chronic obstructive pulmonary disease) ([place])   . Osteoporosis   . Anxiety   . Depression   . Rotator cuff tear     RIGHT  . Complication of anesthesia     " I have to have a spinal beacuse my lungs & heart are bad "  . UTI (urinary tract infection) 09/2014    Current outpatient prescriptions:  .  acetaminophen (TYLENOL) 500 MG tablet, Take 2 tablets (1,000 mg total) by mouth every 8 (eight) hours., Disp: 30 tablet, Rfl: 0 .  albuterol (PROVENTIL HFA;VENTOLIN HFA) 108 (90 BASE) MCG/ACT inhaler, Inhale 2 puffs into the lungs every 6 (six) hours as needed. (Patient taking differently: Inhale 2 puffs into the lungs every 6 (six) hours  as needed for shortness of breath. ), Disp: 1 Inhaler, Rfl: 5 .  alum & mag hydroxide-simeth (MAALOX PLUS) 400-400-40 MG/5ML suspension, Take 30 mLs by mouth every 6 (six) hours as needed for indigestion., Disp: , Rfl:  .  Cholecalciferol (VITAMIN D3) 1000 UNITS CAPS, Take 1 capsule by mouth daily.  , Disp: , Rfl:  .  cyanocobalamin (,VITAMIN B-12,) 1000 MCG/ML injection, Inject 1,000 mcg into the muscle every 30 (thirty) days., Disp: , Rfl:  .  hydrocortisone (ANUSOL-HC) 25 MG suppository, Place 25 mg rectally as needed for hemorrhoids. , Disp: , Rfl:  .  hydroxypropyl methylcellulose (ISOPTO TEARS) 2.5 % ophthalmic solution, Place 1 drop into both eyes as needed for dry eyes. , Disp: , Rfl:  .  lansoprazole (PREVACID) 15 MG capsule, Take 15 mg by mouth every morning. , Disp: , Rfl:  .  levothyroxine (SYNTHROID, LEVOTHROID) 75 MCG tablet, Take 75 mcg by mouth daily.  , Disp: , Rfl:  .  Multiple Vitamin (MULTIVITAMIN) capsule, Take 1 capsule by mouth daily.  , Disp: , Rfl:  .  nitrofurantoin, macrocrystal-monohydrate, (MACROBID) 100 MG capsule, Take 1 capsule (100 mg total) by mouth 2 (two) times daily., Disp: 8 capsule, Rfl: 0 .  nitroGLYCERIN (NITROSTAT) 0.4 MG SL tablet, Place 0.4 mg under the tongue every 5 (five) minutes as needed for chest pain. , Disp: , Rfl:  .  OXYGEN, Inhale 2 L into the lungs  at bedtime. , Disp: , Rfl:  .  Polyethyl Glycol-Propyl Glycol (SYSTANE) 0.4-0.3 % SOLN, Apply 1 drop to eye 3 (three) times daily. , Disp: , Rfl:  .  polyethylene glycol (MIRALAX / GLYCOLAX) packet, Take 17 g by mouth daily as needed for mild constipation. , Disp: , Rfl:  .  senna-docusate (SENOKOT-S) 8.6-50 MG per tablet, Take 1 tablet by mouth at bedtime., Disp: , Rfl:  .  furosemide (LASIX) 20 MG tablet, Take 1 tablet (20 mg total) by mouth daily., Disp: 30 tablet, Rfl: 2  Review of Systems Complains of dyspnea on exertion Denies any cough, sputum production, fever, chills, wheezing. Denies any  chest pain, tightness, palpitations. Has increased bilateral lower extremity edema. Denies any fevers, chills, night sweats, malaise, fatigue. Denies any loss of weight, appetite. Denies any nausea, vomiting, constipation, diarrhea. All other review of systems are negative     Objective:   Physical Exam Blood pressure 118/72, pulse 78, height 5\' 7"  (1.702 m), weight 143 lb 9.6 oz (65.137 kg), SpO2 96 %. Gen.: Pleasant elderly female. No apparent distress  Neuro: No gross focal deficits. Neck: Elevated JVD, lymphadenopathy, thyromegaly. RS: Bibasilar crackles, no wheeze. Non laboured breathing. CVS: S1-S2 heard, no murmurs rubs gallops. 1+ LE edema Abdomen: Soft, positive bowel sounds.    Assessment & Plan:  Acute onset dyspnea with lower extremity edema.  This is likely CHF, diastolic heart failure. She does not have any acute chest pain palpitations and EKG does not show any acute ST-T changes. This does not appear to be an exacerbation of her bronchiectasis.   I'll check a chest x-ray and labs with including BMP, BNP, CBC and troponin. Given prescription for Lasix 20 mg a day. She will follow-up with her primary care physician. She knows to go to the emergency room if she should develop any acute worsening of symptoms, chest pain, palpitations.  Marshell Garfinkel MD [facility] Pager [phone] If no answer or after 3pm call: [phone] 06/03/2015, 5:13 PM

## 2015-06-03 NOTE — Patient Instructions (Signed)
We'll order a chest x-ray and some blood tests. We'll send a prescription for Lasix 20 mg once a day. You will need close follow-up with either Dr. Lake Bells or Dr. Forde Dandy.

## 2015-06-03 NOTE — Telephone Encounter (Signed)
Spoke with patient, advised her of Dr. Anastasia Pall recommendations.  Per Dr. Lake Bells, patient needs to be seen today ASAP.  Advised patient, she said she lives an hour away and that she would need to come tomorrow.  I told her that Dr. Lake Bells is concerned about heart failure and that she needs to come to see someone today or she needs to go to the ER to be seen today.  Patient says that she doesn't feel like she is having heart failure.  I advised her that it is very important that she be seen today.  Patient finally agreed to come in to see Dr. Vaughan Browner at 3:45pm.   Advised Dr. Matilde Bash nurse that patient lives 1 hour away and may be late for appointment. Nothing further needed. Closing encounter

## 2015-06-04 LAB — TROPONIN I: TNIDX: 0.01 ug/L (ref 0.00–0.06)

## 2015-06-04 LAB — BRAIN NATRIURETIC PEPTIDE: Pro B Natriuretic peptide (BNP): 344 pg/mL — ABNORMAL HIGH (ref 0.0–100.0)

## 2015-06-04 LAB — BASIC METABOLIC PANEL
BUN: 16 mg/dL (ref 6–23)
CHLORIDE: 98 meq/L (ref 96–112)
CO2: 32 mEq/L (ref 19–32)
Calcium: 9.5 mg/dL (ref 8.4–10.5)
Creatinine, Ser: 0.95 mg/dL (ref 0.40–1.20)
GFR: 59.26 mL/min — ABNORMAL LOW (ref >60.00)
GLUCOSE: 91 mg/dL (ref 70–99)
POTASSIUM: 4.2 meq/L (ref 3.5–5.1)
SODIUM: 140 meq/L (ref 135–145)

## 2015-06-05 DIAGNOSIS — Z6823 Body mass index (BMI) 23.0-23.9, adult: Secondary | ICD-10-CM | POA: Diagnosis not present

## 2015-06-05 DIAGNOSIS — J479 Bronchiectasis, uncomplicated: Secondary | ICD-10-CM | POA: Diagnosis not present

## 2015-06-05 DIAGNOSIS — Z8781 Personal history of (healed) traumatic fracture: Secondary | ICD-10-CM | POA: Diagnosis not present

## 2015-06-05 DIAGNOSIS — I5031 Acute diastolic (congestive) heart failure: Secondary | ICD-10-CM | POA: Diagnosis not present

## 2015-06-05 DIAGNOSIS — F418 Other specified anxiety disorders: Secondary | ICD-10-CM | POA: Diagnosis not present

## 2015-06-05 DIAGNOSIS — E785 Hyperlipidemia, unspecified: Secondary | ICD-10-CM | POA: Diagnosis not present

## 2015-06-05 DIAGNOSIS — E039 Hypothyroidism, unspecified: Secondary | ICD-10-CM | POA: Diagnosis not present

## 2015-06-05 DIAGNOSIS — I2510 Atherosclerotic heart disease of native coronary artery without angina pectoris: Secondary | ICD-10-CM | POA: Diagnosis not present

## 2015-06-05 DIAGNOSIS — D500 Iron deficiency anemia secondary to blood loss (chronic): Secondary | ICD-10-CM | POA: Diagnosis not present

## 2015-06-06 DIAGNOSIS — R0600 Dyspnea, unspecified: Secondary | ICD-10-CM | POA: Diagnosis not present

## 2015-06-06 DIAGNOSIS — I872 Venous insufficiency (chronic) (peripheral): Secondary | ICD-10-CM | POA: Diagnosis not present

## 2015-06-06 DIAGNOSIS — I5032 Chronic diastolic (congestive) heart failure: Secondary | ICD-10-CM | POA: Diagnosis not present

## 2015-06-06 DIAGNOSIS — M797 Fibromyalgia: Secondary | ICD-10-CM | POA: Diagnosis not present

## 2015-06-06 DIAGNOSIS — I359 Nonrheumatic aortic valve disorder, unspecified: Secondary | ICD-10-CM | POA: Diagnosis not present

## 2015-06-06 DIAGNOSIS — F411 Generalized anxiety disorder: Secondary | ICD-10-CM | POA: Diagnosis not present

## 2015-06-06 DIAGNOSIS — R079 Chest pain, unspecified: Secondary | ICD-10-CM | POA: Diagnosis not present

## 2015-07-17 DIAGNOSIS — M7061 Trochanteric bursitis, right hip: Secondary | ICD-10-CM | POA: Diagnosis not present

## 2015-07-17 DIAGNOSIS — Z96641 Presence of right artificial hip joint: Secondary | ICD-10-CM | POA: Diagnosis not present

## 2015-07-17 DIAGNOSIS — Z471 Aftercare following joint replacement surgery: Secondary | ICD-10-CM | POA: Diagnosis not present

## 2015-07-23 DIAGNOSIS — D1801 Hemangioma of skin and subcutaneous tissue: Secondary | ICD-10-CM | POA: Diagnosis not present

## 2015-07-23 DIAGNOSIS — D225 Melanocytic nevi of trunk: Secondary | ICD-10-CM | POA: Diagnosis not present

## 2015-07-23 DIAGNOSIS — L820 Inflamed seborrheic keratosis: Secondary | ICD-10-CM | POA: Diagnosis not present

## 2015-08-08 ENCOUNTER — Encounter: Payer: Self-pay | Admitting: Pulmonary Disease

## 2015-08-08 ENCOUNTER — Ambulatory Visit (INDEPENDENT_AMBULATORY_CARE_PROVIDER_SITE_OTHER): Payer: Medicare Other | Admitting: Pulmonary Disease

## 2015-08-08 VITALS — BP 124/72 | HR 62 | Temp 98.3°F | Ht 67.0 in | Wt 144.4 lb

## 2015-08-08 DIAGNOSIS — J479 Bronchiectasis, uncomplicated: Secondary | ICD-10-CM

## 2015-08-08 NOTE — Assessment & Plan Note (Signed)
She has recovered from the episode of bronchiectasis flare up treated by my partner a few weeks ago.  She says that her breathing is back to baseline.  She is still producing mucus daily.  Plan: Continue mucociliary clearance methods 2-3 times a day Sputum culture sent again today. F/U 4 months or sooner if needed

## 2015-08-08 NOTE — Progress Notes (Signed)
Subjective:    Patient ID: [name], female    DOB: 1926-10-05, 82 y.o.   MRN: [id number]   Synopsis: Former patient of Dr. Gwenette Greet with bronchiectasis CT chest 2012:  Bronchiectasis in RML, lingula, LLL. PFT's 06/2011: +airtrapping, no restriction, DLCO 76% pred.  Sputum 06/2011: normal flora, no AFB seen Serum Ig's 2012: normal levels.  No change in breathing or mucus with a trial of arcapta or dulera.  Sputum CS 04/2012:  pansensitive pseudomonas Uses albuterol for mucociliary clearance, flutter doesn'  HPI Chief Complaint  Patient presents with  . Follow-up    2 month follow up from acute visit w/ PM - reports breathing is back to baseline.  still taking Lasix 20mg  daily   [name] saw my partner two months ago for a flare up of her bronchiectasis.   She is feeling better now. Still has a cough which is baseline for her. She continues to use the albuterol twice a day which helps. She still feels short of breath, but back to baseline. She is having a lot of hip pain.    Past Medical History  Diagnosis Date  . Hyperlipidemia   . Irregular heartbeat   . Arthritis   . Fibromyalgia   . CHF (congestive heart failure) ([place])   . Coronary artery disease   . Hiatal hernia   . COPD (chronic obstructive pulmonary disease) ([place])   . Osteoporosis   . Anxiety   . Depression   . Rotator cuff tear     RIGHT  . Complication of anesthesia     " I have to have a spinal beacuse my lungs & heart are bad "  . UTI (urinary tract infection) 09/2014      Review of Systems     Objective:   Physical Exam Filed Vitals:   08/08/15 1521  BP: 124/72  Pulse: 62  Temp: 98.3 F (36.8 C)  TempSrc: Oral  Height: 5\' 7"  (1.702 m)  Weight: 144 lb 6.4 oz (65.499 kg)  SpO2: 96%  RA  Gen: well appearing HENT: OP clear, TM's clear, neck supple PULM: Crackles bases, wheeze left, good air movement CV: RRR, no mgr, trace edema GI: BS+, soft, nontender Derm: no cyanosis or  rash Psyche: normal mood and affect  Sputum culture 05/2015 showed OPF which is typical for her sputum cultures      Assessment & Plan:  Bronchiectasis without acute exacerbation She has recovered from the episode of bronchiectasis flare up treated by my partner a few weeks ago.  She says that her breathing is back to baseline.  She is still producing mucus daily.  Plan: Continue mucociliary clearance methods 2-3 times a day Sputum culture sent again today. F/U 4 months or sooner if needed     Current outpatient prescriptions:  .  acetaminophen (TYLENOL) 500 MG tablet, Take 2 tablets (1,000 mg total) by mouth every 8 (eight) hours., Disp: 30 tablet, Rfl: 0 .  albuterol (PROVENTIL HFA;VENTOLIN HFA) 108 (90 BASE) MCG/ACT inhaler, Inhale 2 puffs into the lungs every 6 (six) hours as needed. (Patient taking differently: Inhale 2 puffs into the lungs every 6 (six) hours as needed for shortness of breath. ), Disp: 1 Inhaler, Rfl: 5 .  alum & mag hydroxide-simeth (MAALOX PLUS) 400-400-40 MG/5ML suspension, Take 30 mLs by mouth every 6 (six) hours as needed for indigestion., Disp: , Rfl:  .  Cholecalciferol (VITAMIN D3) 1000 UNITS CAPS, Take 1 capsule by mouth daily.  , Disp: ,  Rfl:  .  furosemide (LASIX) 20 MG tablet, Take 1 tablet (20 mg total) by mouth daily., Disp: 30 tablet, Rfl: 2 .  hydrocortisone (ANUSOL-HC) 25 MG suppository, Place 25 mg rectally as needed for hemorrhoids. , Disp: , Rfl:  .  hydroxypropyl methylcellulose (ISOPTO TEARS) 2.5 % ophthalmic solution, Place 1 drop into both eyes as needed for dry eyes. , Disp: , Rfl:  .  levothyroxine (SYNTHROID, LEVOTHROID) 75 MCG tablet, Take 75 mcg by mouth daily.  , Disp: , Rfl:  .  Magnesium 250 MG TABS, Take 250 mg by mouth daily., Disp: , Rfl:  .  Melatonin 3 MG TABS, Take 3 mg by mouth at bedtime as needed., Disp: , Rfl:  .  Multiple Vitamin (MULTIVITAMIN) capsule, Take 1 capsule by mouth daily.  , Disp: , Rfl:  .  Multiple  Vitamins-Minerals (ICAPS AREDS 2) CAPS, Take 1 capsule by mouth daily., Disp: , Rfl:  .  nitroGLYCERIN (NITROSTAT) 0.4 MG SL tablet, Place 0.4 mg under the tongue every 5 (five) minutes as needed for chest pain. , Disp: , Rfl:  .  OXYGEN, Inhale 2 L into the lungs at bedtime. , Disp: , Rfl:  .  Polyethyl Glycol-Propyl Glycol (SYSTANE) 0.4-0.3 % SOLN, Place 1 drop into both eyes 3 (three) times daily. , Disp: , Rfl:  .  polyethylene glycol (MIRALAX / GLYCOLAX) packet, Take 17 g by mouth daily as needed for mild constipation. , Disp: , Rfl:  .  senna-docusate (SENOKOT-S) 8.6-50 MG per tablet, Take 1 tablet by mouth at bedtime., Disp: , Rfl:  .  vitamin B-12 (CYANOCOBALAMIN) 1000 MCG tablet, Take 1,000 mcg by mouth daily., Disp: , Rfl:  .  lansoprazole (PREVACID) 15 MG capsule, Take 15 mg by mouth every morning. Reported on 08/08/2015, Disp: , Rfl:

## 2015-08-08 NOTE — Patient Instructions (Signed)
Please turn in a specimen of your mucus Use the albuterol twice a day to help clear the mucus We will see you back in 4 months or sooner if needed

## 2015-08-27 DIAGNOSIS — M75101 Unspecified rotator cuff tear or rupture of right shoulder, not specified as traumatic: Secondary | ICD-10-CM | POA: Diagnosis not present

## 2015-08-27 DIAGNOSIS — M25511 Pain in right shoulder: Secondary | ICD-10-CM | POA: Diagnosis not present

## 2015-09-10 DIAGNOSIS — R0600 Dyspnea, unspecified: Secondary | ICD-10-CM | POA: Diagnosis not present

## 2015-09-10 DIAGNOSIS — I359 Nonrheumatic aortic valve disorder, unspecified: Secondary | ICD-10-CM | POA: Diagnosis not present

## 2015-09-10 DIAGNOSIS — I5032 Chronic diastolic (congestive) heart failure: Secondary | ICD-10-CM | POA: Diagnosis not present

## 2015-09-10 DIAGNOSIS — M797 Fibromyalgia: Secondary | ICD-10-CM | POA: Diagnosis not present

## 2015-09-10 DIAGNOSIS — F411 Generalized anxiety disorder: Secondary | ICD-10-CM | POA: Diagnosis not present

## 2015-09-10 DIAGNOSIS — I872 Venous insufficiency (chronic) (peripheral): Secondary | ICD-10-CM | POA: Diagnosis not present

## 2015-09-23 DIAGNOSIS — Z6823 Body mass index (BMI) 23.0-23.9, adult: Secondary | ICD-10-CM | POA: Diagnosis not present

## 2015-09-23 DIAGNOSIS — J479 Bronchiectasis, uncomplicated: Secondary | ICD-10-CM | POA: Diagnosis not present

## 2015-09-23 DIAGNOSIS — J189 Pneumonia, unspecified organism: Secondary | ICD-10-CM | POA: Diagnosis not present

## 2015-09-23 DIAGNOSIS — R05 Cough: Secondary | ICD-10-CM | POA: Diagnosis not present

## 2015-10-09 DIAGNOSIS — N3281 Overactive bladder: Secondary | ICD-10-CM | POA: Diagnosis not present

## 2015-10-09 DIAGNOSIS — E038 Other specified hypothyroidism: Secondary | ICD-10-CM | POA: Diagnosis not present

## 2015-10-09 DIAGNOSIS — J449 Chronic obstructive pulmonary disease, unspecified: Secondary | ICD-10-CM | POA: Diagnosis not present

## 2015-10-09 DIAGNOSIS — J189 Pneumonia, unspecified organism: Secondary | ICD-10-CM | POA: Diagnosis not present

## 2015-10-09 DIAGNOSIS — M797 Fibromyalgia: Secondary | ICD-10-CM | POA: Diagnosis not present

## 2015-10-09 DIAGNOSIS — G609 Hereditary and idiopathic neuropathy, unspecified: Secondary | ICD-10-CM | POA: Diagnosis not present

## 2015-10-09 DIAGNOSIS — F418 Other specified anxiety disorders: Secondary | ICD-10-CM | POA: Diagnosis not present

## 2015-10-09 DIAGNOSIS — D500 Iron deficiency anemia secondary to blood loss (chronic): Secondary | ICD-10-CM | POA: Diagnosis not present

## 2015-10-09 DIAGNOSIS — Z6823 Body mass index (BMI) 23.0-23.9, adult: Secondary | ICD-10-CM | POA: Diagnosis not present

## 2015-10-09 DIAGNOSIS — J479 Bronchiectasis, uncomplicated: Secondary | ICD-10-CM | POA: Diagnosis not present

## 2015-10-09 DIAGNOSIS — I5031 Acute diastolic (congestive) heart failure: Secondary | ICD-10-CM | POA: Diagnosis not present

## 2015-10-09 DIAGNOSIS — Z1389 Encounter for screening for other disorder: Secondary | ICD-10-CM | POA: Diagnosis not present

## 2015-10-23 DIAGNOSIS — M75121 Complete rotator cuff tear or rupture of right shoulder, not specified as traumatic: Secondary | ICD-10-CM | POA: Diagnosis not present

## 2015-10-23 DIAGNOSIS — M25511 Pain in right shoulder: Secondary | ICD-10-CM | POA: Diagnosis not present

## 2015-10-31 DIAGNOSIS — H04123 Dry eye syndrome of bilateral lacrimal glands: Secondary | ICD-10-CM | POA: Diagnosis not present

## 2015-10-31 DIAGNOSIS — H353120 Nonexudative age-related macular degeneration, left eye, stage unspecified: Secondary | ICD-10-CM | POA: Diagnosis not present

## 2015-10-31 DIAGNOSIS — H35372 Puckering of macula, left eye: Secondary | ICD-10-CM | POA: Diagnosis not present

## 2015-10-31 DIAGNOSIS — H35342 Macular cyst, hole, or pseudohole, left eye: Secondary | ICD-10-CM | POA: Diagnosis not present

## 2015-11-06 DIAGNOSIS — N3946 Mixed incontinence: Secondary | ICD-10-CM | POA: Diagnosis not present

## 2015-11-06 DIAGNOSIS — R351 Nocturia: Secondary | ICD-10-CM | POA: Diagnosis not present

## 2015-11-06 DIAGNOSIS — Z0000 Encounter for general adult medical examination without abnormal findings: Secondary | ICD-10-CM | POA: Diagnosis not present

## 2015-11-06 DIAGNOSIS — N952 Postmenopausal atrophic vaginitis: Secondary | ICD-10-CM | POA: Diagnosis not present

## 2015-11-21 ENCOUNTER — Other Ambulatory Visit: Payer: Medicare Other

## 2015-11-21 DIAGNOSIS — J479 Bronchiectasis, uncomplicated: Secondary | ICD-10-CM

## 2015-11-24 LAB — LOWER RESPIRATORY CULTURE

## 2015-12-03 IMAGING — DX DG CHEST 2V
2 series · 2 of 2 positions shown · non-contrast
Comparison: 07/03/2014

CLINICAL DATA: Right hip pain

EXAM:
CHEST  2 VIEW

[chest lat]
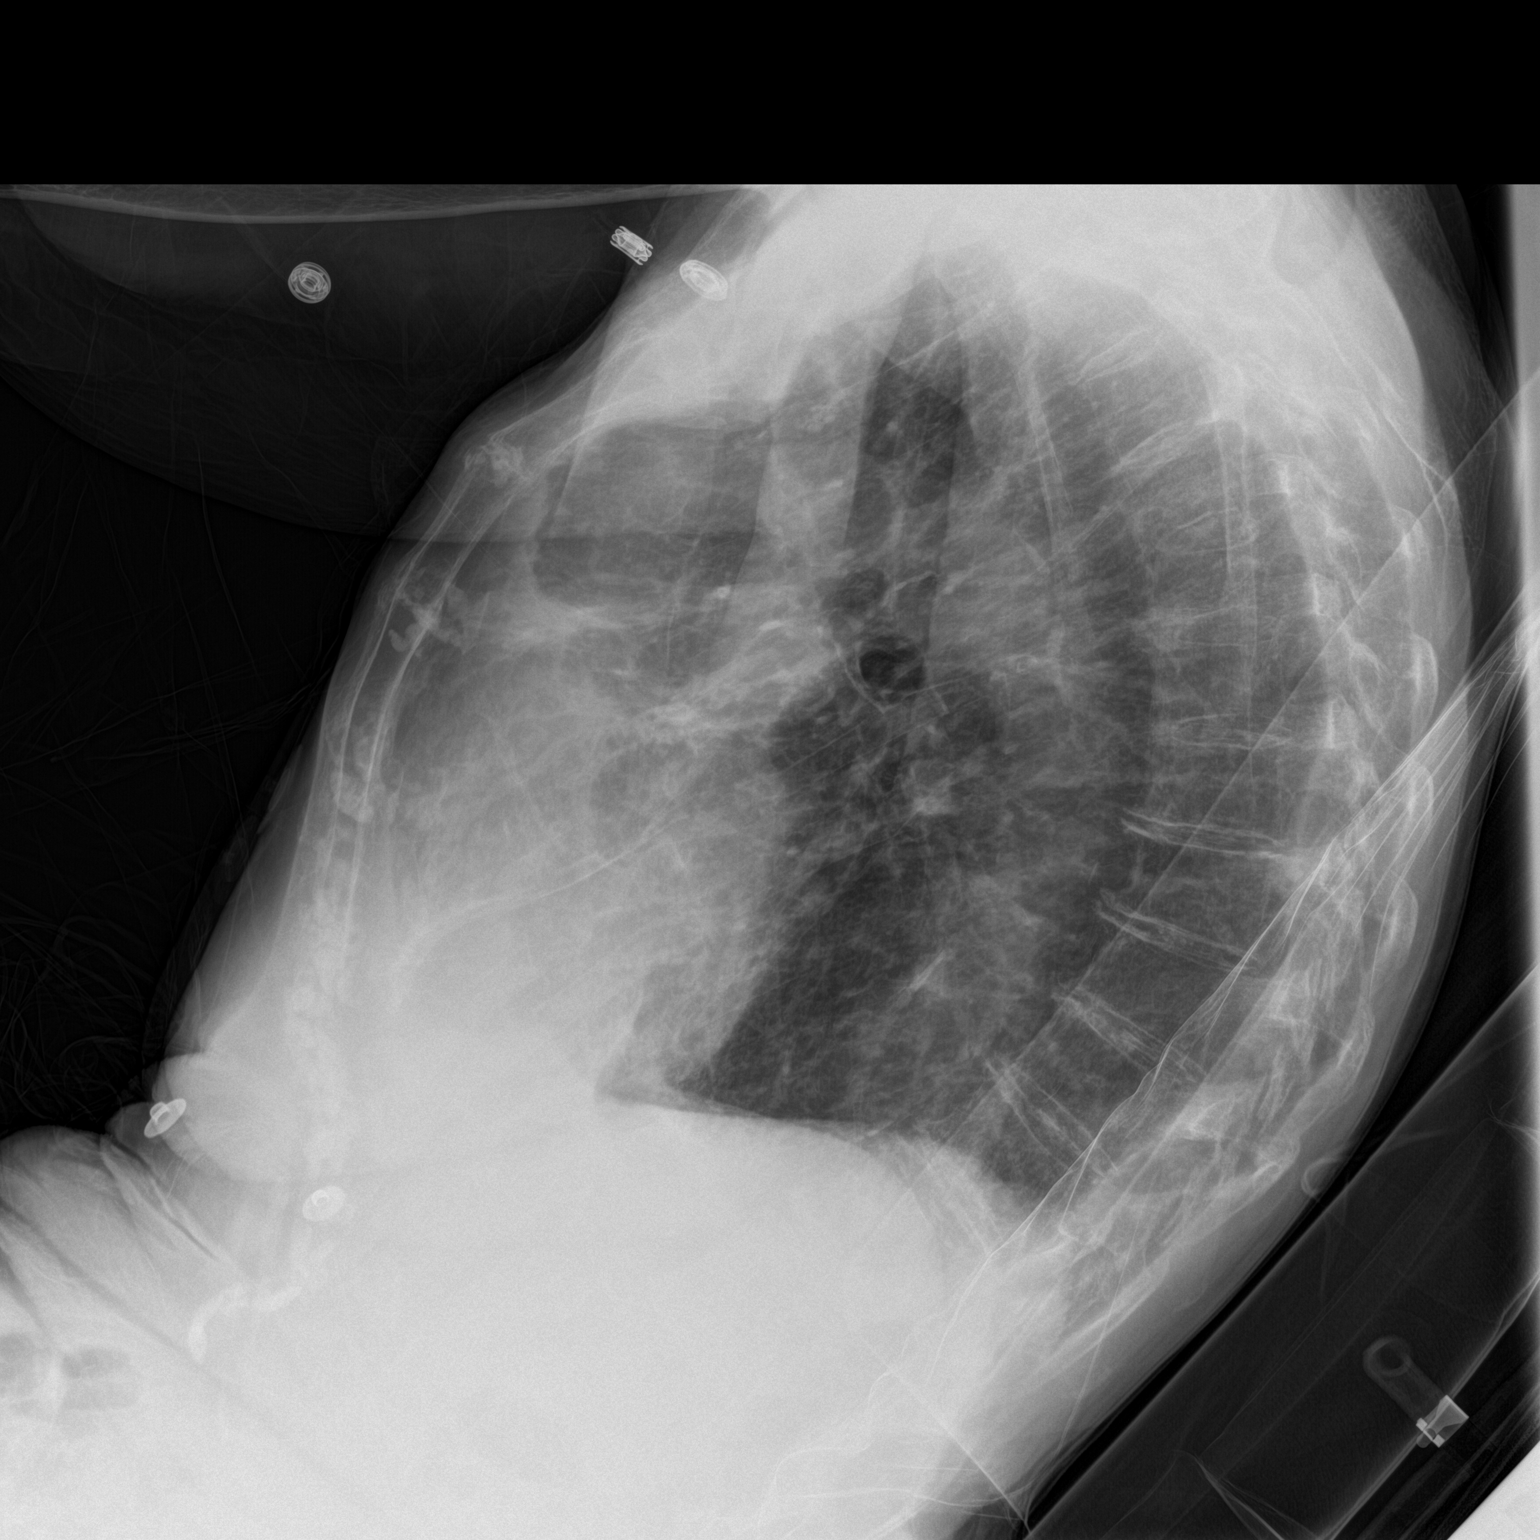

[chest ap]
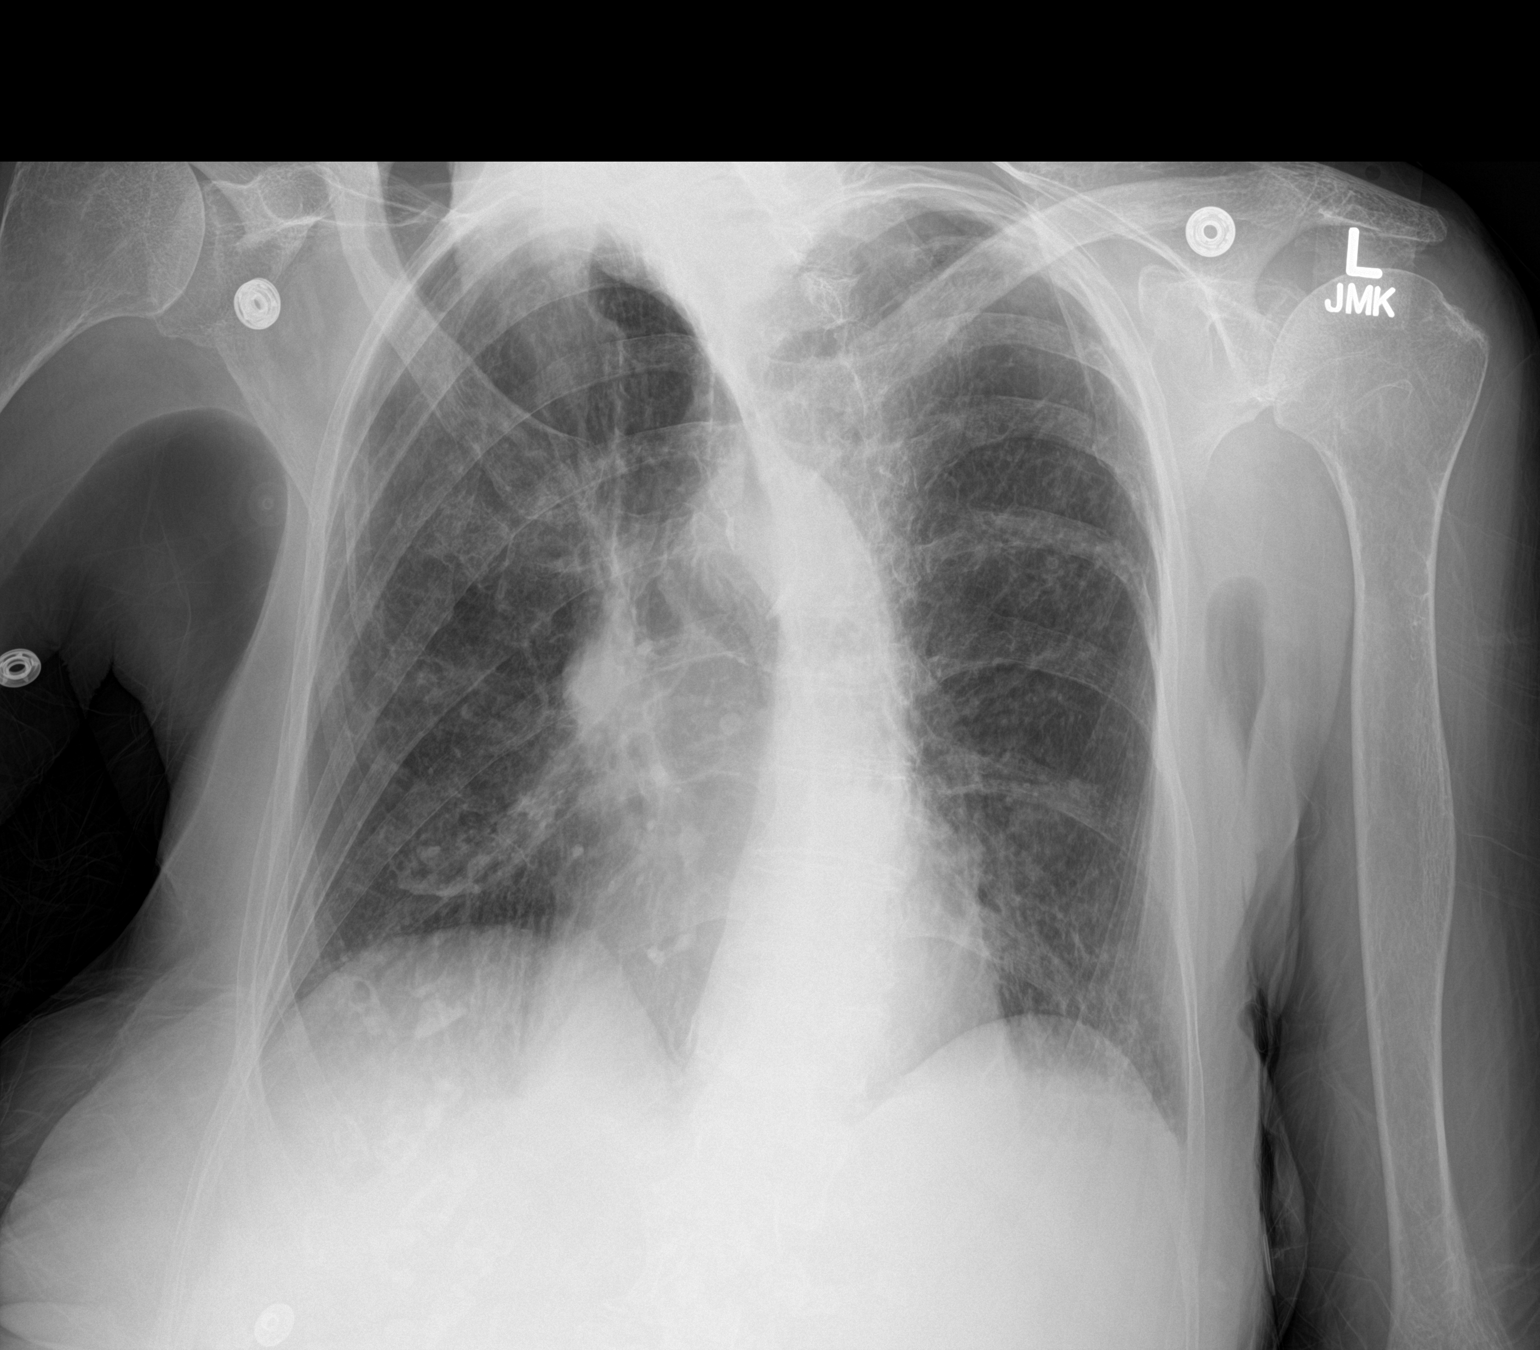

[2 of 2 positions shown; findings below may reference images not displayed]

FINDINGS: Normal heart size. There is no pleural effusion identified. No
airspace consolidation. The lungs are hyperinflated and there are
coarsened interstitial markings noted bilaterally. Bronchiectasis
and scarring involving the right middle lobe and lingular portion
the left lung are noted. The bones appear osteopenic and there is a
scoliosis deformity involving the thoracic spine.
IMPRESSION: 1. No acute cardiopulmonary abnormalities.
2. Chronic interstitial changes.

## 2015-12-03 IMAGING — DX DG HIP (WITH OR WITHOUT PELVIS) 2-3V*R*
3 series · 3 of 3 positions shown · non-contrast
Comparison: None

CLINICAL DATA: Hip pain

EXAM:
RIGHT HIP (WITH PELVIS) 2-3 VIEWS

[pelvis ap]
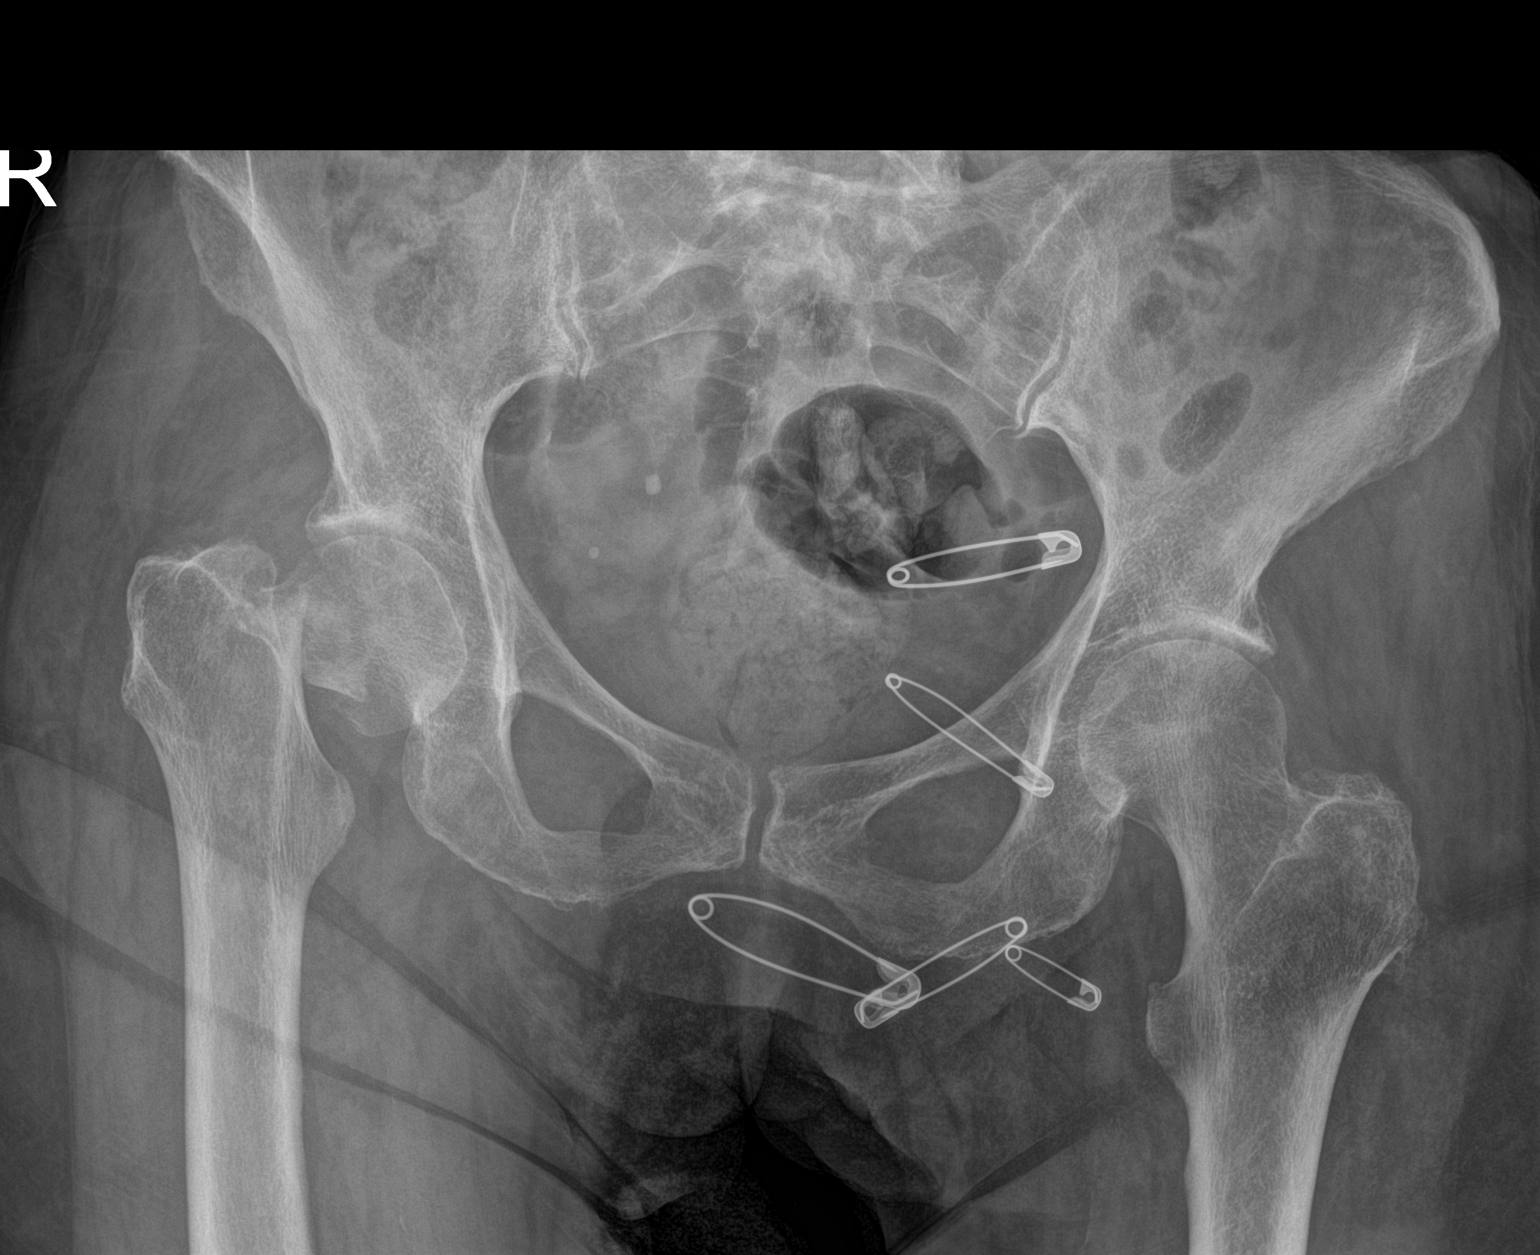

[hip ap]
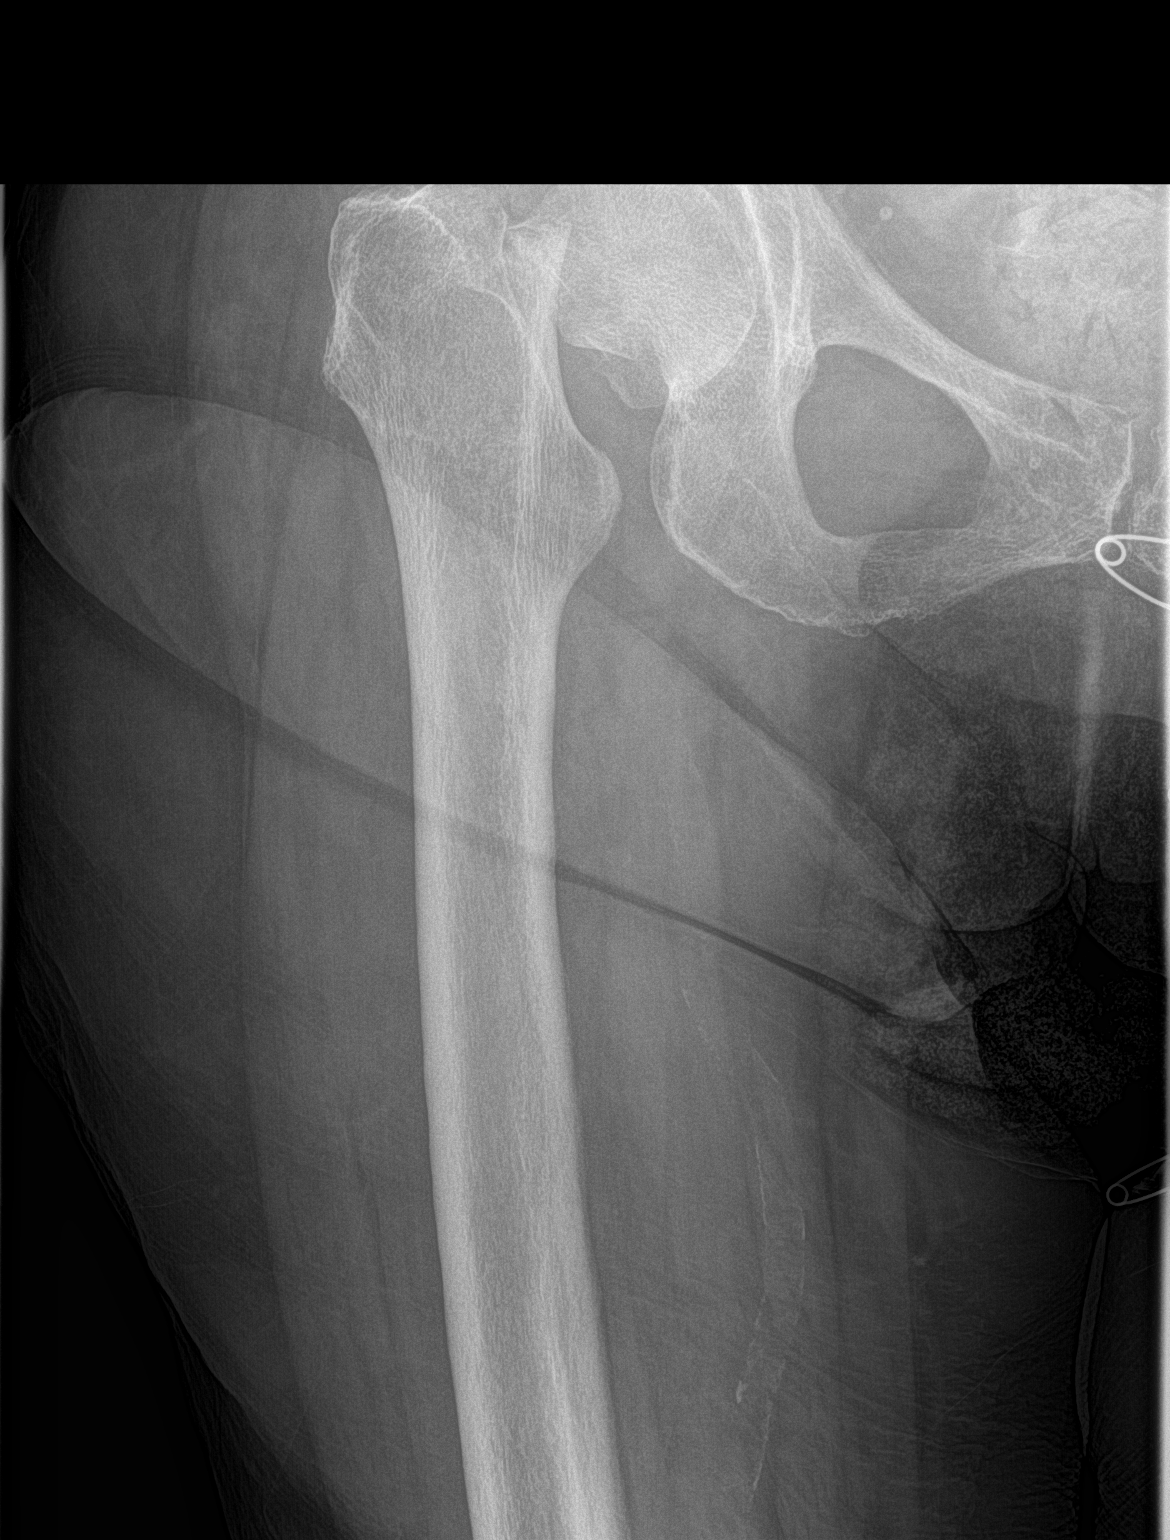

[hip lat]
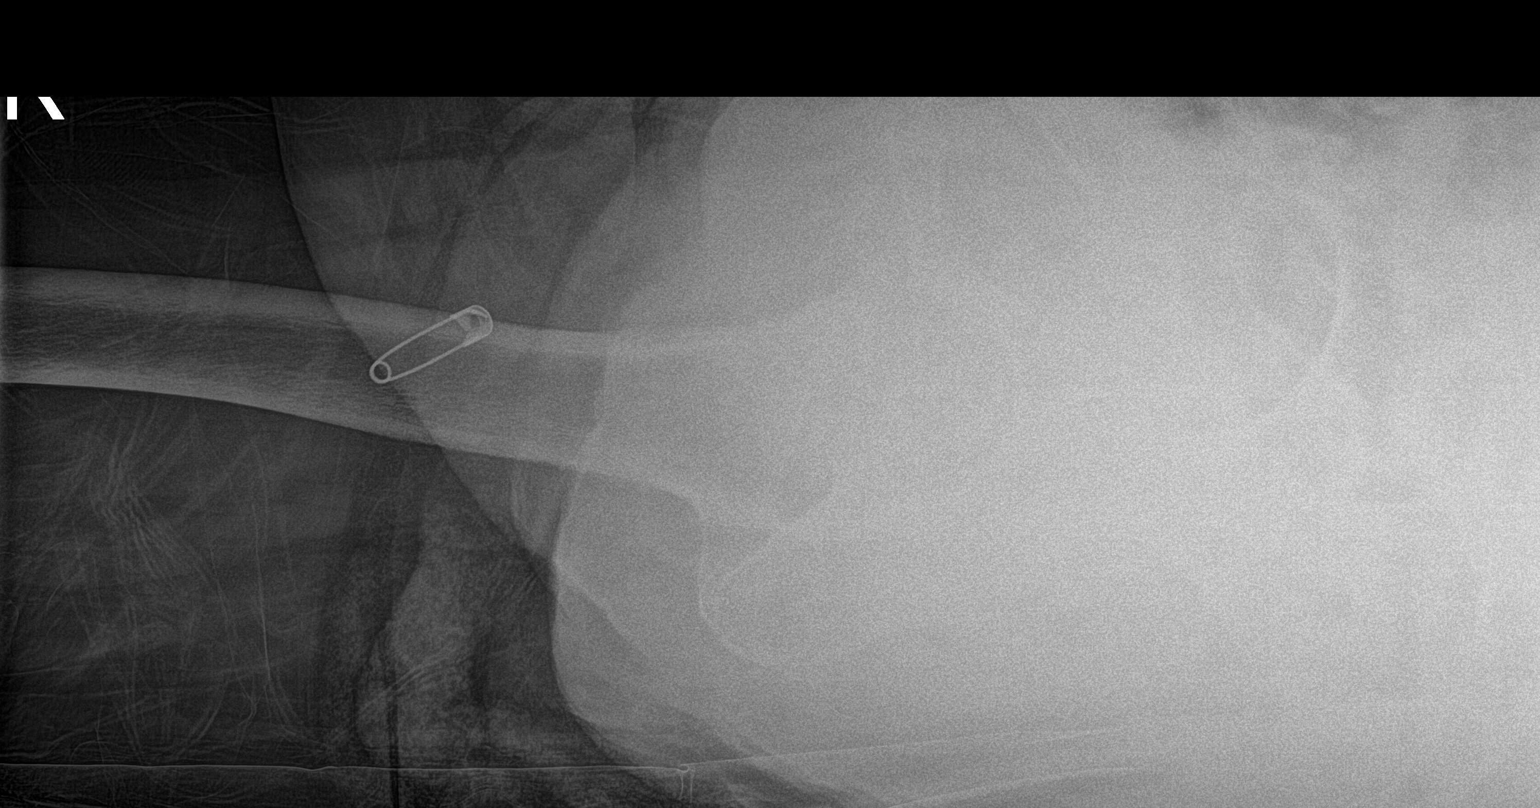

[3 of 3 positions shown; findings below may reference images not displayed]

FINDINGS: There is a right subcapital femoral neck fracture. There is
displacement of the distal fracture fragments proximally. Left hip
appears located and intact.
IMPRESSION: Acute right subcapital femoral neck fracture.

## 2015-12-09 ENCOUNTER — Encounter: Payer: Self-pay | Admitting: Pulmonary Disease

## 2015-12-09 ENCOUNTER — Ambulatory Visit (INDEPENDENT_AMBULATORY_CARE_PROVIDER_SITE_OTHER): Payer: Medicare Other | Admitting: Pulmonary Disease

## 2015-12-09 VITALS — BP 128/64 | HR 70 | Ht 67.0 in | Wt 137.0 lb

## 2015-12-09 DIAGNOSIS — J479 Bronchiectasis, uncomplicated: Secondary | ICD-10-CM

## 2015-12-09 NOTE — Assessment & Plan Note (Signed)
Bronchiectasis Follow up: Plan: Follow up with Dr. Wynonia Lawman / PCP regarding your fluid retention. Continue your mucociliary clearance methods 2-3 time daily. Continue your over the counter non-sedating anti histamines daily for seasonal allergies. Continue to use your rescue inhaler every 6 hours as needed. Check with your PCP to see if he has any Xopenex inhaler samples for you to try. Follow up with Dr. Lake Bells in 4 months. Please contact office for sooner follow up if symptoms do not improve or worsen or seek emergency care

## 2015-12-09 NOTE — Progress Notes (Signed)
Subjective:    Patient ID: [name], female    DOB: 09/10/1926, 82 y.o.   MRN: [id number]  HPI Synopsis: Former patient of Dr. Gwenette Greet with bronchiectasis CT chest 2012: Bronchiectasis in RML, lingula, LLL. PFT's 06/2011: +airtrapping, no restriction, DLCO 76% pred.  Sputum 06/2011: normal flora, no AFB seen Serum Ig's 2012: normal levels.  No change in breathing or mucus with a trial of arcapta or dulera.  Sputum CS 04/2012: pansensitive pseudomonas Uses albuterol for mucociliary clearance, flutter doesn' 12/09/2015  Pt. Presents to the office today for follow up of bronchiectasis.She had pneumonia in February, for which she took Levaquin. This cleared her secretions. She states that there was CXR confirmation of resolution of pneumonia.( Dr. Forde Dandy?) She brought in a sputum sample which resulted as routine respiratory flora. She states she has some right sided aches, sputum is light yellow, no fever, orthtopnea, hemoptysis. She states she has fluid build up, for which she takes lasix. She states she has not been as compliant with her mucociliary clearance methods as in the past..She will work on doing better.She states she has been eating well, and that her weight is stable.She is unable to walk as much or go to PT due to fatigue.    Current outpatient prescriptions:  .  acetaminophen (TYLENOL) 500 MG tablet, Take 2 tablets (1,000 mg total) by mouth every 8 (eight) hours., Disp: 30 tablet, Rfl: 0 .  albuterol (PROVENTIL HFA;VENTOLIN HFA) 108 (90 BASE) MCG/ACT inhaler, Inhale 2 puffs into the lungs every 6 (six) hours as needed. (Patient taking differently: Inhale 2 puffs into the lungs every 6 (six) hours as needed for shortness of breath. ), Disp: 1 Inhaler, Rfl: 5 .  Cholecalciferol (VITAMIN D3) 1000 UNITS CAPS, Take 1 capsule by mouth daily.  , Disp: , Rfl:  .  furosemide (LASIX) 20 MG tablet, Take 1 tablet (20 mg total) by mouth daily., Disp: 30 tablet, Rfl: 2 .  hydrocortisone  (ANUSOL-HC) 25 MG suppository, Place 25 mg rectally as needed for hemorrhoids. , Disp: , Rfl:  .  hydroxypropyl methylcellulose (ISOPTO TEARS) 2.5 % ophthalmic solution, Place 1 drop into both eyes as needed for dry eyes. , Disp: , Rfl:  .  lansoprazole (PREVACID) 15 MG capsule, Take 15 mg by mouth every morning. Reported on 08/08/2015, Disp: , Rfl:  .  levothyroxine (SYNTHROID, LEVOTHROID) 75 MCG tablet, Take 75 mcg by mouth daily.  , Disp: , Rfl:  .  Magnesium 250 MG TABS, Take 250 mg by mouth daily., Disp: , Rfl:  .  Melatonin 3 MG TABS, Take 3 mg by mouth at bedtime as needed., Disp: , Rfl:  .  Multiple Vitamin (MULTIVITAMIN) capsule, Take 1 capsule by mouth daily.  , Disp: , Rfl:  .  Multiple Vitamins-Minerals (ICAPS AREDS 2) CAPS, Take 1 capsule by mouth daily., Disp: , Rfl:  .  nitroGLYCERIN (NITROSTAT) 0.4 MG SL tablet, Place 0.4 mg under the tongue every 5 (five) minutes as needed for chest pain. , Disp: , Rfl:  .  OXYGEN, Inhale 2 L into the lungs at bedtime. , Disp: , Rfl:  .  Polyethyl Glycol-Propyl Glycol (SYSTANE) 0.4-0.3 % SOLN, Place 1 drop into both eyes 3 (three) times daily. , Disp: , Rfl:  .  polyethylene glycol (MIRALAX / GLYCOLAX) packet, Take 17 g by mouth daily as needed for mild constipation. , Disp: , Rfl:  .  senna-docusate (SENOKOT-S) 8.6-50 MG per tablet, Take 1 tablet by mouth at bedtime.,  Disp: , Rfl:  .  vitamin B-12 (CYANOCOBALAMIN) 1000 MCG tablet, Take 1,000 mcg by mouth daily., Disp: , Rfl:    Past Medical History  Diagnosis Date  . Hyperlipidemia   . Irregular heartbeat   . Arthritis   . Fibromyalgia   . CHF (congestive heart failure) ([place])   . Coronary artery disease   . Hiatal hernia   . COPD (chronic obstructive pulmonary disease) ([place])   . Osteoporosis   . Anxiety   . Depression   . Rotator cuff tear     RIGHT  . Complication of anesthesia     " I have to have a spinal beacuse my lungs & heart are bad "  . UTI (urinary tract infection) 09/2014     Allergies  Allergen Reactions  . Shrimp [Shellfish Allergy]   . Fish Allergy Nausea And Vomiting    Extremely sick    Review of Systems Constitutional:   No  weight loss, night sweats,  Fevers, chills, fatigue, or  lassitude.  HEENT:   No headaches,  Difficulty swallowing,  Tooth/dental problems, or  Sore throat,                No sneezing, itching, ear ache, nasal congestion, + post nasal drip,   CV:  No chest pain,  Orthopnea, PND, swelling in lower extremities, anasarca, dizziness, palpitations, syncope.   GI  No heartburn, indigestion, abdominal pain, nausea, vomiting, diarrhea, change in bowel habits, loss of appetite, bloody stools.   Resp: + shortness of breath with exertion not at rest.  + excess mucus, +productive cough,  No non-productive cough,  No coughing up of blood.  + change in color of mucus.  No wheezing.  No chest wall deformity  Skin: no rash or lesions.  GU: no dysuria, change in color of urine, no urgency or frequency.  No flank pain, no hematuria   MS:  No joint pain or swelling.  No decreased range of motion.  No back pain.  Psych:  No change in mood or affect. No depression or anxiety.  No memory loss.  Magdalen Spatz, AGACNP-BC [facility] 12/09/2015       Objective:   Physical Exam  BP 128/64 mmHg  Pulse 70  Ht 5\' 7"  (1.702 m)  Wt 137 lb (62.143 kg)  BMI 21.45 kg/m2  SpO2 98%  Physical Exam:  General- No distress,  A&Ox3, very frail, pleasant, using walker ENT: No sinus tenderness, TM clear, pale nasal mucosa, no oral exudate,no post nasal drip, no LAN Cardiac: S1, S2, regular rate and rhythm, no murmur Chest: No wheeze/ + rales/ no dullness; no accessory muscle use, no nasal flaring, no sternal retractions Abd.: Soft Non-tender Ext: No clubbing cyanosis, 1+edema lower extremities. Neuro:  normal strength Skin: No rashes, warm and dry Psych: normal mood and behavior     Assessment & Plan:    Bronchiectasis Follow up: Plan: Follow up with Dr. Wynonia Lawman / PCP regarding your fluid retention. Continue your mucociliary clearance methods 2-3 time daily. Continue your over the counter non-sedating anti histamines daily for seasonal allergies. Continue to use your rescue inhaler every 6 hours as needed. Check with your PCP to see if he has any Xopenex inhaler samples for you to try. Follow up with Dr. Lake Bells in 4 months. Please contact office for sooner follow up if symptoms do not improve or worsen or seek emergency care   Attending:  I have seen and examined the patient  with nurse practitioner/resident and agree with the note above.  We formulated the plan together and I elicited the following history.    Ms. Rosana Hoes is recovering from pneumonia. Overall doing fairly well. Some leg swelling. Not taking Lasix regularly. Some shortness of breath. Not performing facility clearance regular. Apparently is taking some sort of inhaled steroid.  On exam: Coarse crackles right base Edema X  I agree with the findings above, she is to follow-up with cardiology regarding her diuretic medicines, we did encourage her to take the diuretics as prescribed not on an as-needed basis Trial of Xopenex She is encouraged today to follow-up with mucus file clearance measures regularly  Roselie Awkward, MD [place] Pager: [phone] Cell: [phone] After 3pm or if no response, call [phone]

## 2015-12-09 NOTE — Patient Instructions (Addendum)
It is nice to meet you today. Follow up with Dr. Wynonia Lawman  regarding your fluid retention. Continue your mucociliary clearance methods 2-3 time daily. Continue your over the counter non-sedating anti histamines daily for seasonal allergies. Continue to use your rescue inhaler every 6 hours as needed. Check with your PCP to see if he has any Xopenex inhaler samples for you to try. Follow up with Dr. Lake Bells in 4 months. Please contact office for sooner follow up if symptoms do not improve or worsen or seek emergency care

## 2015-12-10 ENCOUNTER — Telehealth: Payer: Self-pay | Admitting: Pulmonary Disease

## 2015-12-10 NOTE — Telephone Encounter (Signed)
LMOM TCB x1 for Dr Baldwin Crown nurse Lattie Haw to ask about the information the patient was given Called spoke with patient and informed her of what BQ said and that we have tried to speak with Dr Baldwin Crown nurse about it.  Pt okay with this and voiced her understanding.  We will call pt with updates.

## 2015-12-10 NOTE — Telephone Encounter (Signed)
Dr Forde Dandy office calling back stating that they don't need a letter for pt tp get samples, they were telling pt that when she needs a refill that she needed to contact our office , she said that they would be more than happy to give pt sample, if you need to speak w/her you can call back @ [phone] [facility].Hillery Hunter

## 2015-12-10 NOTE — Telephone Encounter (Signed)
Dr. Baldwin Crown office closed (after 5:00) wcb tomorrow

## 2015-12-10 NOTE — Telephone Encounter (Signed)
Patient called and states she would like for [place] to fax a letter to Dr. Forde Dandy and inform his office that Dr. Lake Bells said it was ok to give her samples. Dr. Baldwin Crown nurse told her that she would need to get samples from [place]. Dr. Baldwin Crown ph# [phone]  Patient states that Dr. Forde Dandy has a new nurse and she will not give her samples of the Ventolin HFA inhaler.  She said that she needs Dr. Lake Bells to let Dr. Forde Dandy know that it is okay for him to give her samples of "Albuterol inhalers".  She said that she has a very fixed income and cannot afford inhalers and this is the only way she can use one.  Dr. Lake Bells, please advise.

## 2015-12-10 NOTE — Telephone Encounter (Signed)
The nurse from Dr. Baldwin Crown office is mistaken.  We will not nor do we need to send a letter.

## 2015-12-12 NOTE — Telephone Encounter (Signed)
Lis returning call she said you would need to go through the switchboard and  to have her overhead paged call from [facility] that way if she is ion a call she can take our call so that you won't be playing phone tag she .Hillery Hunter

## 2015-12-12 NOTE — Telephone Encounter (Signed)
Lm on named VM for [name] with Dr. Baldwin Crown office to call office back

## 2015-12-12 NOTE — Telephone Encounter (Signed)
LM for Lattie Haw at Dr Baldwin Crown office

## 2015-12-12 NOTE — Telephone Encounter (Signed)
Spoke with Lattie Haw with Dr Baldwin Crown office- states that they will supply samples if they get any and we will be responsible for Refills. Not sure if patient needs refills at this time.  Called pt, LM to discuss everything

## 2015-12-13 NOTE — Telephone Encounter (Addendum)
Spoke with pt. She is aware of the below information. She states that she does not need refills at this time.

## 2015-12-13 NOTE — Telephone Encounter (Signed)
Pt returning call.[name] ° °

## 2015-12-25 DIAGNOSIS — R351 Nocturia: Secondary | ICD-10-CM | POA: Diagnosis not present

## 2015-12-25 DIAGNOSIS — N3946 Mixed incontinence: Secondary | ICD-10-CM | POA: Diagnosis not present

## 2015-12-25 DIAGNOSIS — Z0000 Encounter for general adult medical examination without abnormal findings: Secondary | ICD-10-CM | POA: Diagnosis not present

## 2016-01-22 DIAGNOSIS — N3946 Mixed incontinence: Secondary | ICD-10-CM | POA: Diagnosis not present

## 2016-01-22 DIAGNOSIS — N3281 Overactive bladder: Secondary | ICD-10-CM | POA: Diagnosis not present

## 2016-01-22 DIAGNOSIS — N3642 Intrinsic sphincter deficiency (ISD): Secondary | ICD-10-CM | POA: Diagnosis not present

## 2016-01-31 ENCOUNTER — Telehealth: Payer: Self-pay | Admitting: Pulmonary Disease

## 2016-01-31 MED ORDER — AZITHROMYCIN 250 MG PO TABS: ORAL_TABLET | ORAL | Status: DC

## 2016-01-31 NOTE — Telephone Encounter (Signed)
Called spoke with pt. Reviewed MW's recs. Pt voiced understanding and had no further questions. Rx sent.

## 2016-01-31 NOTE — Telephone Encounter (Signed)
Last ov with BQ on 12/09/15  Patient Instructions       It is nice to meet you today. Follow up with Dr. Wynonia Lawman  regarding your fluid retention. Continue your mucociliary clearance methods 2-3 time daily. Continue your over the counter non-sedating anti histamines daily for seasonal allergies. Continue to use your rescue inhaler every 6 hours as needed. Check with your PCP to see if he has any Xopenex inhaler samples for you to try. Follow up with Dr. Lake Bells in 4 months. Please contact office for sooner follow up if symptoms do not improve or worsen or seek emergency care     Called spoke with pt. Pt c/o prod cough with yellow mucus, sinus drainage, wheezing, SOB, headaches, and chest tightness/congestion. She states that her husband cleaned with bleach and since she has had these symptoms. I explained to her that BQ is out of the office but I would send the message to the doc of the day. Verified pharmacy as [facility] in [place]. She voiced understanding and had no further questions.   MW please advise

## 2016-01-31 NOTE — Telephone Encounter (Signed)
Would try zpak first and if not better over weekend [place] first of week - if worse to ER over w/e

## 2016-02-05 DIAGNOSIS — M6281 Muscle weakness (generalized): Secondary | ICD-10-CM | POA: Diagnosis not present

## 2016-02-05 DIAGNOSIS — R278 Other lack of coordination: Secondary | ICD-10-CM | POA: Diagnosis not present

## 2016-02-05 DIAGNOSIS — N3281 Overactive bladder: Secondary | ICD-10-CM | POA: Diagnosis not present

## 2016-02-05 DIAGNOSIS — N3946 Mixed incontinence: Secondary | ICD-10-CM | POA: Diagnosis not present

## 2016-02-12 ENCOUNTER — Encounter (HOSPITAL_COMMUNITY): Payer: Self-pay | Admitting: Emergency Medicine

## 2016-02-12 ENCOUNTER — Telehealth: Payer: Self-pay | Admitting: Pulmonary Disease

## 2016-02-12 ENCOUNTER — Emergency Department (HOSPITAL_COMMUNITY)
Admission: EM | Admit: 2016-02-12 | Discharge: 2016-02-12 | Disposition: A | Discharge status: Home / Self Care | Payer: Medicare Other | Attending: Emergency Medicine | Admitting: Emergency Medicine

## 2016-02-12 ENCOUNTER — Emergency Department (HOSPITAL_COMMUNITY): Payer: Medicare Other

## 2016-02-12 DIAGNOSIS — R05 Cough: Secondary | ICD-10-CM | POA: Diagnosis present

## 2016-02-12 DIAGNOSIS — R069 Unspecified abnormalities of breathing: Secondary | ICD-10-CM | POA: Diagnosis not present

## 2016-02-12 DIAGNOSIS — E785 Hyperlipidemia, unspecified: Secondary | ICD-10-CM | POA: Insufficient documentation

## 2016-02-12 DIAGNOSIS — J441 Chronic obstructive pulmonary disease with (acute) exacerbation: Secondary | ICD-10-CM | POA: Insufficient documentation

## 2016-02-12 DIAGNOSIS — I509 Heart failure, unspecified: Secondary | ICD-10-CM | POA: Diagnosis not present

## 2016-02-12 DIAGNOSIS — R0602 Shortness of breath: Secondary | ICD-10-CM | POA: Diagnosis not present

## 2016-02-12 DIAGNOSIS — I10 Essential (primary) hypertension: Secondary | ICD-10-CM | POA: Diagnosis not present

## 2016-02-12 DIAGNOSIS — F329 Major depressive disorder, single episode, unspecified: Secondary | ICD-10-CM | POA: Diagnosis not present

## 2016-02-12 DIAGNOSIS — J189 Pneumonia, unspecified organism: Secondary | ICD-10-CM

## 2016-02-12 DIAGNOSIS — I2510 Atherosclerotic heart disease of native coronary artery without angina pectoris: Secondary | ICD-10-CM | POA: Diagnosis not present

## 2016-02-12 DIAGNOSIS — M1990 Unspecified osteoarthritis, unspecified site: Secondary | ICD-10-CM | POA: Insufficient documentation

## 2016-02-12 DIAGNOSIS — R0981 Nasal congestion: Secondary | ICD-10-CM | POA: Diagnosis not present

## 2016-02-12 DIAGNOSIS — Z79899 Other long term (current) drug therapy: Secondary | ICD-10-CM | POA: Diagnosis not present

## 2016-02-12 DIAGNOSIS — J989 Respiratory disorder, unspecified: Secondary | ICD-10-CM | POA: Diagnosis not present

## 2016-02-12 LAB — CBC WITH DIFFERENTIAL/PLATELET
BASOS PCT: 0 %
Basophils Absolute: 0 10*3/uL (ref 0.0–0.1)
EOS ABS: 0.1 10*3/uL (ref 0.0–0.7)
EOS PCT: 1 %
HCT: 38 % (ref 36.0–46.0)
Hemoglobin: 12.2 g/dL (ref 12.0–15.0)
LYMPHS ABS: 0.9 10*3/uL (ref 0.7–4.0)
Lymphocytes Relative: 10 %
MCH: 31 pg (ref 26.0–34.0)
MCHC: 32.1 g/dL (ref 30.0–36.0)
MCV: 96.7 fL (ref 78.0–100.0)
Monocytes Absolute: 0.8 10*3/uL (ref 0.1–1.0)
Monocytes Relative: 9 %
NEUTROS PCT: 80 %
Neutro Abs: 6.7 10*3/uL (ref 1.7–7.7)
PLATELETS: 140 10*3/uL — AB (ref 150–400)
RBC: 3.93 MIL/uL (ref 3.87–5.11)
RDW: 13.6 % (ref 11.5–15.5)
WBC: 8.4 10*3/uL (ref 4.0–10.5)

## 2016-02-12 LAB — COMPREHENSIVE METABOLIC PANEL
ALBUMIN: 3.8 g/dL (ref 3.5–5.0)
ALT: 14 U/L (ref 14–54)
ANION GAP: 6 (ref 5–15)
AST: 25 U/L (ref 15–41)
Alkaline Phosphatase: 79 U/L (ref 38–126)
BUN: 17 mg/dL (ref 6–20)
CHLORIDE: 100 mmol/L — AB (ref 101–111)
CO2: 31 mmol/L (ref 22–32)
CREATININE: 0.86 mg/dL (ref 0.44–1.00)
Calcium: 8.9 mg/dL (ref 8.9–10.3)
GFR calc non Af Amer: 59 mL/min — ABNORMAL LOW (ref >60)
GLUCOSE: 118 mg/dL — AB (ref 65–99)
Potassium: 3.6 mmol/L (ref 3.5–5.1)
SODIUM: 137 mmol/L (ref 135–145)
Total Bilirubin: 0.6 mg/dL (ref 0.3–1.2)
Total Protein: 7.5 g/dL (ref 6.5–8.1)

## 2016-02-12 LAB — BLOOD GAS, ARTERIAL
ACID-BASE EXCESS: 6.2 mmol/L — AB (ref 0.0–2.0)
BICARBONATE: 30.4 meq/L — AB (ref 20.0–24.0)
Drawn by: 277331
FIO2: 0.21
O2 SAT: 98.4 %
PO2 ART: 99.3 mmHg (ref 80.0–100.0)
Patient temperature: 37
TCO2: 35.5 mmol/L (ref 0–100)
pCO2 arterial: 35.5 mmHg (ref 35.0–45.0)
pH, Arterial: 7.527 — ABNORMAL HIGH (ref 7.350–7.450)

## 2016-02-12 LAB — URINE MICROSCOPIC-ADD ON
BACTERIA UA: NONE SEEN
Squamous Epithelial / LPF: NONE SEEN
WBC, UA: NONE SEEN WBC/hpf (ref 0–5)

## 2016-02-12 LAB — URINALYSIS, ROUTINE W REFLEX MICROSCOPIC
Bilirubin Urine: NEGATIVE
Glucose, UA: NEGATIVE mg/dL
Ketones, ur: NEGATIVE mg/dL
Leukocytes, UA: NEGATIVE
NITRITE: NEGATIVE
PH: 7 (ref 5.0–8.0)
PROTEIN: NEGATIVE mg/dL

## 2016-02-12 LAB — BRAIN NATRIURETIC PEPTIDE: B Natriuretic Peptide: 287 pg/mL — ABNORMAL HIGH (ref 0.0–100.0)

## 2016-02-12 LAB — TROPONIN I: Troponin I: <0.03 ng/mL (ref <0.03)

## 2016-02-12 LAB — I-STAT CG4 LACTIC ACID, ED: Lactic Acid, Venous: 1.7 mmol/L (ref 0.5–1.9)

## 2016-02-12 MED ORDER — LEVOFLOXACIN 500 MG PO TABS
500.0000 mg | ORAL_TABLET | Freq: Once | ORAL | Status: AC
  Administered 2016-02-12: 500 mg via ORAL
  Filled 2016-02-12: qty 1

## 2016-02-12 MED ORDER — METOPROLOL TARTRATE 5 MG/5ML IV SOLN
5.0000 mg | Freq: Once | INTRAVENOUS | Status: AC
  Administered 2016-02-12: 5 mg via INTRAVENOUS
  Filled 2016-02-12: qty 5

## 2016-02-12 MED ORDER — METHYLPREDNISOLONE SODIUM SUCC 125 MG IJ SOLR
125.0000 mg | Freq: Once | INTRAMUSCULAR | Status: AC
  Administered 2016-02-12: 125 mg via INTRAVENOUS
  Filled 2016-02-12: qty 2

## 2016-02-12 MED ORDER — PREDNISONE 10 MG (21) PO TBPK
10.0000 mg | ORAL_TABLET | Freq: Every day | ORAL | Status: DC
Start: 2016-02-12 — End: 2016-03-05

## 2016-02-12 MED ORDER — LEVOFLOXACIN 500 MG PO TABS: 500.0000 mg | ORAL_TABLET | Freq: Every day | ORAL | Status: DC

## 2016-02-12 MED ORDER — IPRATROPIUM-ALBUTEROL 0.5-2.5 (3) MG/3ML IN SOLN
3.0000 mL | Freq: Once | RESPIRATORY_TRACT | Status: AC
  Administered 2016-02-12: 3 mL via RESPIRATORY_TRACT
  Filled 2016-02-12: qty 3

## 2016-02-12 MED ORDER — ALBUTEROL SULFATE (2.5 MG/3ML) 0.083% IN NEBU
5.0000 mg | INHALATION_SOLUTION | Freq: Once | RESPIRATORY_TRACT | Status: AC
  Administered 2016-02-12: 5 mg via RESPIRATORY_TRACT
  Filled 2016-02-12: qty 6

## 2016-02-12 NOTE — Telephone Encounter (Signed)
Pt was transferred to me from the front desk. She was unable to speak more than 1-2 words w/o deeply gasping for air. I advised pt she needs to call 911 for them to go and take her to the ED. She advised she will do so now.  I advised pt I will call her back in a few minutes to check on her.

## 2016-02-12 NOTE — Telephone Encounter (Signed)
Spoke with pt and the ambulance was arriving as we were speaking. Will sign off message

## 2016-02-12 NOTE — ED Notes (Signed)
Per EMS, pt c/o cough and congestion x 2 days. States she has been treating at home with OTC medications with no relief. Pt wears 2L nasal cannula at home while sleeping.

## 2016-02-12 NOTE — ED Notes (Signed)
RT at bedside.

## 2016-02-12 NOTE — Telephone Encounter (Signed)
Noted, thanks!

## 2016-02-12 NOTE — ED Provider Notes (Signed)
CSN: [id number]     Arrival date & time 02/12/16  1115 History  By signing my name below, I, Rayna Sexton, attest that this documentation has been prepared under the direction and in the presence of Isla Pence, MD. Electronically Signed: Rayna Sexton, ED Scribe. 02/12/2016. 11:38 AM.   Chief Complaint  Patient presents with  . Cough   The history is provided by the patient. No language interpreter was used.   HPI Comments: [name] is a 82 y.o. female with a PMHx of COPD on home O2 2L/[place] at night who presents to the Emergency Department by ambulance complaining of worsening, moderate, cough x 2 days. Pt reports associated chest congestion. She has taken OTC medications and used her albuterol inhaler without relief.   Past Medical History  Diagnosis Date  . Hyperlipidemia   . Irregular heartbeat   . Arthritis   . Fibromyalgia   . CHF (congestive heart failure) ([place])   . Coronary artery disease   . Hiatal hernia   . COPD (chronic obstructive pulmonary disease) ([place])   . Osteoporosis   . Anxiety   . Depression   . Rotator cuff tear     RIGHT  . Complication of anesthesia     " I have to have a spinal beacuse my lungs & heart are bad "  . UTI (urinary tract infection) 09/2014   Past Surgical History  Procedure Laterality Date  . Appendectomy    . Dilation and curettage of uterus      x2  . Elbow surgery      Right  . Cataract extraction      Left  . Hip arthroplasty Right 09/22/2014    Procedure: ARTHROPLASTY BIPOLAR HIP;  Surgeon: [name], MD;  Location: [facility];  Service: Orthopedics;  Laterality: Right;  . Tonsillectomy    . Total hip revision Right 10/13/2014    Procedure: REVISION RIGHT TOTAL HIP POSTERIOR ;  Surgeon: Rod Can, MD;  Location: [facility];  Service: Orthopedics;  Laterality: Right;   Family History  Problem Relation Age of Onset  . Pancreatic cancer Father   . Heart failure Mother   . Hypertension Mother   . Heart attack Brother    . Hypertension Brother   . Stroke Brother   . Heart attack Sister   . Diabetes Son   . Obesity Son   . Heart attack Son    Social History  Substance Use Topics  . Smoking status: Never Smoker   . Smokeless tobacco: Never Used  . Alcohol Use: No   OB History    Gravida Para Term Preterm AB TAB SAB Ectopic Multiple Living   1 1        1      Review of Systems  Constitutional: Positive for fatigue.  HENT: Positive for congestion.   Respiratory: Positive for cough.   All other systems reviewed and are negative.   Allergies  Shrimp; Fish allergy; and Other  Home Medications   Prior to Admission medications   Medication Sig Start Date End Date Taking? Authorizing Provider  acetaminophen (TYLENOL) 500 MG tablet Take 2 tablets (1,000 mg total) by mouth every 8 (eight) hours. Patient taking differently: Take 1,000 mg by mouth every 8 (eight) hours as needed for mild pain or headache.  10/15/14  Yes Rod Can, MD  albuterol (PROVENTIL HFA;VENTOLIN HFA) 108 (90 BASE) MCG/ACT inhaler Inhale 2 puffs into the lungs every 6 (six) hours as needed. Patient taking  differently: Inhale 2 puffs into the lungs every 6 (six) hours as needed for shortness of breath.  04/20/12  Yes Kathee Delton, MD  calcium carbonate (TUMS - DOSED IN MG ELEMENTAL CALCIUM) 500 MG chewable tablet Chew 2 tablets by mouth daily as needed (acid reflux).   Yes Historical Provider, MD  Cholecalciferol (VITAMIN D3) 1000 UNITS CAPS Take 1 capsule by mouth daily.     Yes Historical Provider, MD  clorazepate (TRANXENE) 7.5 MG tablet Take 3.75 mg by mouth 2 (two) times daily as needed (muscle).   Yes Historical Provider, MD  furosemide (LASIX) 20 MG tablet Take 1 tablet (20 mg total) by mouth daily. Patient taking differently: Take 20 mg by mouth daily as needed for fluid.  06/03/15  Yes Praveen Mannam, MD  hydrocortisone (ANUSOL-HC) 25 MG suppository Place 25 mg rectally as needed for hemorrhoids.    Yes Historical  Provider, MD  hydroxypropyl methylcellulose (ISOPTO TEARS) 2.5 % ophthalmic solution Place 1 drop into both eyes as needed for dry eyes.    Yes Historical Provider, MD  levothyroxine (SYNTHROID, LEVOTHROID) 75 MCG tablet Take 75 mcg by mouth daily.     Yes Historical Provider, MD  Magnesium 250 MG TABS Take 250 mg by mouth daily.   Yes Historical Provider, MD  Multiple Vitamin (MULTIVITAMIN) capsule Take 1 capsule by mouth daily.     Yes Historical Provider, MD  Multiple Vitamins-Minerals (ICAPS AREDS 2) CAPS Take 1 capsule by mouth daily.   Yes Historical Provider, MD  oxybutynin (DITROPAN) 5 MG tablet Take 5 mg by mouth daily.    Yes Historical Provider, MD  OXYGEN Inhale 2 L into the lungs at bedtime.    Yes Historical Provider, MD  Polyethyl Glycol-Propyl Glycol (SYSTANE) 0.4-0.3 % SOLN Place 1 drop into both eyes 3 (three) times daily.    Yes Historical Provider, MD  Probiotic CAPS Take 1 capsule by mouth daily.   Yes Historical Provider, MD  azithromycin (ZITHROMAX) 250 MG tablet Take 2 tablets by mouth today, then take 1 tablet daily until gone Patient not taking: Reported on 02/12/2016 01/31/16   Tanda Rockers, MD  levofloxacin (LEVAQUIN) 500 MG tablet Take 1 tablet (500 mg total) by mouth daily. 02/12/16   Isla Pence, MD  nitroGLYCERIN (NITROSTAT) 0.4 MG SL tablet Place 0.4 mg under the tongue every 5 (five) minutes as needed for chest pain.     Historical Provider, MD  polyethylene glycol (MIRALAX / GLYCOLAX) packet Take 17 g by mouth daily as needed for mild constipation.     Historical Provider, MD  predniSONE (STERAPRED UNI-PAK 21 TAB) 10 MG (21) TBPK tablet Take 1 tablet (10 mg total) by mouth daily. Take 6 tabs by mouth daily  for 2 days, then 5 tabs for 2 days, then 4 tabs for 2 days, then 3 tabs for 2 days, 2 tabs for 2 days, then 1 tab by mouth daily for 2 days 02/12/16   Isla Pence, MD  senna-docusate (SENOKOT-S) 8.6-50 MG per tablet Take 1 tablet by mouth at bedtime. Patient  not taking: Reported on 02/12/2016 10/17/14   Costin Karlyne Greenspan, MD   BP 151/92 mmHg  Pulse 85  Temp(Src) 98 F (36.7 C) (Oral)  Resp 20  Ht 5\' 7"  (1.702 m)  Wt 137 lb (62.143 kg)  BMI 21.45 kg/m2  SpO2 98%    Physical Exam  Constitutional: She is oriented to person, place, and time. She appears well-developed.  Pt coughing during examination  HENT:  Head: Normocephalic and atraumatic.  Eyes: EOM are normal.  Neck: Normal range of motion.  Cardiovascular: Normal rate.   Pulmonary/Chest: She is in respiratory distress. She has wheezes.  Diffuse wheezing bilaterally   Abdominal: Soft.  Musculoskeletal: Normal range of motion.  Neurological: She is alert and oriented to person, place, and time.  Skin: Skin is warm and dry.  Psychiatric: She has a normal mood and affect.  Nursing note and vitals reviewed.   ED Course  Procedures  COORDINATION OF CARE: 11:22 AM Discussed next steps with pt. Pt verbalized understanding and is agreeable with the plan.   Labs Review Labs Reviewed  CBC WITH DIFFERENTIAL/PLATELET - Abnormal; Notable for the following:    Platelets 140 (*)    All other components within normal limits  COMPREHENSIVE METABOLIC PANEL - Abnormal; Notable for the following:    Chloride 100 (*)    Glucose, Bld 118 (*)    GFR calc non Af Amer 59 (*)    All other components within normal limits  URINALYSIS, ROUTINE W REFLEX MICROSCOPIC (NOT AT [facility]) - Abnormal; Notable for the following:    Specific Gravity, Urine <1.005 (*)    Hgb urine dipstick TRACE (*)    All other components within normal limits  BLOOD GAS, ARTERIAL - Abnormal; Notable for the following:    pH, Arterial 7.527 (*)    Bicarbonate 30.4 (*)    Acid-Base Excess 6.2 (*)    All other components within normal limits  BRAIN NATRIURETIC PEPTIDE - Abnormal; Notable for the following:    B Natriuretic Peptide 287.0 (*)    All other components within normal limits  CULTURE, BLOOD (ROUTINE X 2)  CULTURE,  BLOOD (ROUTINE X 2)  TROPONIN I  URINE MICROSCOPIC-ADD ON  I-STAT CG4 LACTIC ACID, ED    Imaging Review Dg Chest 2 View  02/12/2016  CLINICAL DATA:  Shortness of breath with cough and congestion EXAM: CHEST  2 VIEW COMPARISON:  June 03, 2015 FINDINGS: Lungs are hyperexpanded. There is a left pleural effusion with interstitial edema. There is mild airspace consolidation in the left base. Heart is enlarged with mild pulmonary venous hypertension. There is atherosclerotic calcification in aorta. No adenopathy. There is degenerative change in the thoracic spine. IMPRESSION: There is evidence of congestive heart failure superimposed on COPD. Question alveolar edema versus small focus of pneumonia left base. There is aortic atherosclerosis. Electronically Signed   By: Lowella Grip III M.D.   On: 02/12/2016 12:50   I have personally reviewed and evaluated these images and lab results as part of my medical decision-making.   EKG Interpretation   Date/Time:  Wednesday February 12 2016 11:20:41 EDT Ventricular Rate:  124 PR Interval:    QRS Duration: 91 QT Interval:  332 QTC Calculation: 388 R Axis:   -32 Text Interpretation:  Sinus tachycardia Paired ventricular premature  complexes LVH with secondary repolarization abnormality Anterior Q waves,  possibly due to LVH Confirmed by [facility] MD, Cheryll Keisler ([id number]) on 02/12/2016  11:50:13 AM      MDM  Pt is feeling better.  Tachycardia due to multiple alb nebs.  She is not hypoxic.  She is able to eat.  She will be started on levaquin and prednisone.  She knows to return if worse. Final diagnoses:  CAP (community acquired pneumonia)  COPD exacerbation ([place])    I personally performed the services described in this documentation, which was scribed in my presence. The recorded information has been reviewed  and is accurate.    Isla Pence, MD 02/12/16 1435

## 2016-02-13 ENCOUNTER — Telehealth: Payer: Self-pay | Admitting: Pulmonary Disease

## 2016-02-13 NOTE — Telephone Encounter (Signed)
Spoke with pt and she states that she was advised by ED to be seen within 2 days of d/c. Per d/c they asked her to see Dr. Forde Dandy, per pt she called his office and he wants her to see pulm. Appt made with PM 02/14/16 @ 4:15pm and ok'd by JJ. Pt advised. Nothing further needed.

## 2016-02-14 ENCOUNTER — Encounter: Payer: Self-pay | Admitting: Pulmonary Disease

## 2016-02-14 ENCOUNTER — Ambulatory Visit (INDEPENDENT_AMBULATORY_CARE_PROVIDER_SITE_OTHER): Payer: Medicare Other | Admitting: Pulmonary Disease

## 2016-02-14 VITALS — BP 144/74 | HR 87 | Ht 67.0 in | Wt 140.4 lb

## 2016-02-14 DIAGNOSIS — J479 Bronchiectasis, uncomplicated: Secondary | ICD-10-CM | POA: Diagnosis not present

## 2016-02-14 MED ORDER — HYDROCODONE-HOMATROPINE 5-1.5 MG/5ML PO SYRP: 5.0000 mL | ORAL_SOLUTION | Freq: Three times a day (TID) | ORAL | Status: DC | PRN

## 2016-02-14 NOTE — Progress Notes (Signed)
Subjective:    Patient ID: [name], female    DOB: 12/07/1926, 82 y.o.   MRN: [id number]  HPI Acute visit for evaluation of dyspnea, bronchiectasis exacerbation  [name] is a 82 year old with history of bronchiectasis, diastolic heart failure. She was seen in the emergency room 2 days ago with cough, congestion, sputum production, chills, dyspnea, wheezing. A chest x-ray at that time showed bilateral opacities. She was given 7 days of Levaquin and a prednisone taper.  Today in the office she reports slowly improving symptoms although she still feels fatigued and has persistent cough.  DATA: Echo (07/03/14) - Mild LVH with LVEF Q000111Q, grade 2 diastolic dysfunction. Mild left atrial enlargement. MAC with trivial mitral regurgitation. Mild aortic regurgitation. Trivial tricuspid regurgitation with mildly increased PASP 38 mmHg.  CXR (02/12/16) Images reviewed. Patchy B/L infiltrates.   Past Medical History  Diagnosis Date  . Hyperlipidemia   . Irregular heartbeat   . Arthritis   . Fibromyalgia   . CHF (congestive heart failure) ([place])   . Coronary artery disease   . Hiatal hernia   . COPD (chronic obstructive pulmonary disease) ([place])   . Osteoporosis   . Anxiety   . Depression   . Rotator cuff tear     RIGHT  . Complication of anesthesia     " I have to have a spinal beacuse my lungs & heart are bad "  . UTI (urinary tract infection) 09/2014    Current outpatient prescriptions:  .  acetaminophen (TYLENOL) 500 MG tablet, Take 2 tablets (1,000 mg total) by mouth every 8 (eight) hours. (Patient taking differently: Take 1,000 mg by mouth every 8 (eight) hours as needed for mild pain or headache. ), Disp: 30 tablet, Rfl: 0 .  albuterol (PROVENTIL HFA;VENTOLIN HFA) 108 (90 BASE) MCG/ACT inhaler, Inhale 2 puffs into the lungs every 6 (six) hours as needed. (Patient taking differently: Inhale 2 puffs into the lungs every 6 (six) hours as needed for shortness of breath.  ), Disp: 1 Inhaler, Rfl: 5 .  calcium carbonate (TUMS - DOSED IN MG ELEMENTAL CALCIUM) 500 MG chewable tablet, Chew 2 tablets by mouth daily as needed (acid reflux)., Disp: , Rfl:  .  Cholecalciferol (VITAMIN D3) 1000 UNITS CAPS, Take 1 capsule by mouth daily.  , Disp: , Rfl:  .  clorazepate (TRANXENE) 7.5 MG tablet, Take 3.75 mg by mouth 2 (two) times daily as needed (muscle)., Disp: , Rfl:  .  furosemide (LASIX) 20 MG tablet, Take 1 tablet (20 mg total) by mouth daily. (Patient taking differently: Take 20 mg by mouth daily as needed for fluid. ), Disp: 30 tablet, Rfl: 2 .  hydrocortisone (ANUSOL-HC) 25 MG suppository, Place 25 mg rectally as needed for hemorrhoids. , Disp: , Rfl:  .  hydroxypropyl methylcellulose (ISOPTO TEARS) 2.5 % ophthalmic solution, Place 1 drop into both eyes as needed for dry eyes. , Disp: , Rfl:  .  levofloxacin (LEVAQUIN) 500 MG tablet, Take 1 tablet (500 mg total) by mouth daily., Disp: 7 tablet, Rfl: 0 .  levothyroxine (SYNTHROID, LEVOTHROID) 75 MCG tablet, Take 75 mcg by mouth daily.  , Disp: , Rfl:  .  Magnesium 250 MG TABS, Take 250 mg by mouth daily., Disp: , Rfl:  .  magnesium hydroxide (MILK OF MAGNESIA) 400 MG/5ML suspension, Take by mouth daily as needed for mild constipation., Disp: , Rfl:  .  Multiple Vitamin (MULTIVITAMIN) capsule, Take 1 capsule by mouth daily.  , Disp: ,  Rfl:  .  Multiple Vitamins-Minerals (ICAPS AREDS 2) CAPS, Take 1 capsule by mouth daily., Disp: , Rfl:  .  nitroGLYCERIN (NITROSTAT) 0.4 MG SL tablet, Place 0.4 mg under the tongue every 5 (five) minutes as needed for chest pain. , Disp: , Rfl:  .  OXYGEN, Inhale 2 L into the lungs at bedtime. , Disp: , Rfl:  .  Polyethyl Glycol-Propyl Glycol (SYSTANE) 0.4-0.3 % SOLN, Place 1 drop into both eyes 3 (three) times daily. , Disp: , Rfl:  .  polyethylene glycol (MIRALAX / GLYCOLAX) packet, Take 17 g by mouth daily as needed for mild constipation. , Disp: , Rfl:  .  predniSONE (STERAPRED UNI-PAK  21 TAB) 10 MG (21) TBPK tablet, Take 1 tablet (10 mg total) by mouth daily. Take 6 tabs by mouth daily  for 2 days, then 5 tabs for 2 days, then 4 tabs for 2 days, then 3 tabs for 2 days, 2 tabs for 2 days, then 1 tab by mouth daily for 2 days, Disp: 42 tablet, Rfl: 0 .  Probiotic CAPS, Take 1 capsule by mouth daily., Disp: , Rfl:  .  oxybutynin (DITROPAN) 5 MG tablet, Take 5 mg by mouth daily. Reported on 02/14/2016, Disp: , Rfl:   Review of Systems Complains of dyspnea on exertion + cough, sputum production, chills, wheezing. No fevers Denies any chest pain, tightness, palpitations. Has increased bilateral lower extremity edema. Denies any fevers, chills, night sweats, malaise, fatigue. Denies any loss of weight, appetite. Denies any nausea, vomiting, constipation, diarrhea. All other review of systems are negative     Objective:   Physical Exam Blood pressure 144/74, pulse 87, height 5\' 7"  (1.702 m), weight 140 lb 6.4 oz (63.685 kg), SpO2 91 %.;s Gen.: Pleasant elderly female. Mild distress  Neuro: No gross focal deficits. Neck: Elevated JVD, lymphadenopathy, thyromegaly. RS: Bibasilar crackles, no wheeze. Non laboured breathing. CVS: S1-S2 heard, no murmurs rubs gallops. trace LE edema Abdomen: Soft, positive bowel sounds.    Assessment & Plan:  Bronchiectasis exacerbation  She is making a slow improvement but feels that she will porbably need more than 7 days of abx. I told her 7 days would probably be adequate. She will call if there is no improvement in symptoms and for reassessment if additional abx is needed I will give her hycodan for cough suppression I encouraged her to use the flutter valve  Although CXR is read as vascular congestion she does not appear to clinically be in CHF.  Plan: - Continue levaquin and prednisone taper - Hycodan cough syrup  Return in 2 weeks to make sure she continues to improve.    Marshell Garfinkel MD [facility] Pager [phone] If no answer or after 3pm call: [phone] 02/14/2016, 4:51 PM

## 2016-02-14 NOTE — Patient Instructions (Signed)
Please continue the levaquin and prednisone taper. At the end of 7 days if you are not improving then call us We will prescribe hycodan cough syrup We will make a return appointment in 2-3 weeks to make sure you continue to improve.

## 2016-02-17 LAB — CULTURE, BLOOD (ROUTINE X 2)
Culture: NO GROWTH
Culture: NO GROWTH

## 2016-02-25 DIAGNOSIS — J479 Bronchiectasis, uncomplicated: Secondary | ICD-10-CM | POA: Diagnosis not present

## 2016-02-25 DIAGNOSIS — E784 Other hyperlipidemia: Secondary | ICD-10-CM | POA: Diagnosis not present

## 2016-02-25 DIAGNOSIS — E038 Other specified hypothyroidism: Secondary | ICD-10-CM | POA: Diagnosis not present

## 2016-02-25 DIAGNOSIS — F418 Other specified anxiety disorders: Secondary | ICD-10-CM | POA: Diagnosis not present

## 2016-02-25 DIAGNOSIS — Z6822 Body mass index (BMI) 22.0-22.9, adult: Secondary | ICD-10-CM | POA: Diagnosis not present

## 2016-02-25 DIAGNOSIS — D500 Iron deficiency anemia secondary to blood loss (chronic): Secondary | ICD-10-CM | POA: Diagnosis not present

## 2016-02-25 DIAGNOSIS — R51 Headache: Secondary | ICD-10-CM | POA: Diagnosis not present

## 2016-03-05 ENCOUNTER — Other Ambulatory Visit: Payer: Self-pay | Admitting: Adult Health

## 2016-03-05 ENCOUNTER — Ambulatory Visit (INDEPENDENT_AMBULATORY_CARE_PROVIDER_SITE_OTHER)
Admission: RE | Admit: 2016-03-05 | Discharge: 2016-03-05 | Disposition: A | Discharge status: Home / Self Care | Payer: Medicare Other | Source: Ambulatory Visit | Attending: Adult Health | Admitting: Adult Health

## 2016-03-05 ENCOUNTER — Encounter: Payer: Self-pay | Admitting: Adult Health

## 2016-03-05 ENCOUNTER — Ambulatory Visit (INDEPENDENT_AMBULATORY_CARE_PROVIDER_SITE_OTHER): Payer: Medicare Other | Admitting: Adult Health

## 2016-03-05 DIAGNOSIS — J441 Chronic obstructive pulmonary disease with (acute) exacerbation: Secondary | ICD-10-CM

## 2016-03-05 DIAGNOSIS — J189 Pneumonia, unspecified organism: Secondary | ICD-10-CM

## 2016-03-05 DIAGNOSIS — R05 Cough: Secondary | ICD-10-CM | POA: Diagnosis not present

## 2016-03-05 DIAGNOSIS — J479 Bronchiectasis, uncomplicated: Secondary | ICD-10-CM

## 2016-03-05 MED ORDER — ALBUTEROL SULFATE HFA 108 (90 BASE) MCG/ACT IN AERS: 2.0000 | INHALATION_SPRAY | Freq: Four times a day (QID) | RESPIRATORY_TRACT | 5 refills | Status: DC | PRN

## 2016-03-05 NOTE — Patient Instructions (Signed)
Continue with flutter valve 2-3 times daily .  Continue on current regimen .  Follow up Dr. [place] Bells in 3 months and As needed

## 2016-03-06 NOTE — Progress Notes (Signed)
Subjective:    Patient ID: HAZELYN BACANI, female    DOB: 1927-02-13, 82 y.o.   MRN: [id number]  HPI 82 year old female with history of bronchiectasis and diastolic congestive heart failure  03/05/16 Follow up : COPD  Pt returns for 2 week follow up .  Recent flare , tx w/ abx and steroids . He finished Levaquin and a prednisone taper. She is starting to feel better. As any fever, chest pain, hemoptysis, orthopnea, or increased leg swelling. Chest x-ray today shows chronic changes bilaterally without an acute infiltrate. Resolved right pleural effusion with a tiny left pleural effusion remaining.   TEST  Echo (07/03/14) - Mild LVH with LVEF Q000111Q, grade 2 diastolic dysfunction. Mild left atrial enlargement. MAC with trivial mitral regurgitation. Mild aortic regurgitation. Trivial tricuspid regurgitation with mildly increased PASP 38 mmHg.  CXR (02/12/16) Images reviewed. Patchy B/L infiltrates.   Past Medical History:  Diagnosis Date  . Anxiety   . Arthritis   . CHF (congestive heart failure) ([place])   . Complication of anesthesia    " I have to have a spinal beacuse my lungs & heart are bad "  . COPD (chronic obstructive pulmonary disease) ([place])   . Coronary artery disease   . Depression   . Fibromyalgia   . Hiatal hernia   . Hyperlipidemia   . Irregular heartbeat   . Osteoporosis   . Rotator cuff tear    RIGHT  . UTI (urinary tract infection) 09/2014   Current Outpatient Prescriptions on File Prior to Visit  Medication Sig Dispense Refill  . acetaminophen (TYLENOL) 500 MG tablet Take 2 tablets (1,000 mg total) by mouth every 8 (eight) hours. (Patient taking differently: Take 1,000 mg by mouth every 8 (eight) hours as needed for mild pain or headache. ) 30 tablet 0  . calcium carbonate (TUMS - DOSED IN MG ELEMENTAL CALCIUM) 500 MG chewable tablet Chew 2 tablets by mouth daily as needed (acid reflux).    . Cholecalciferol (VITAMIN D3) 1000 UNITS CAPS Take 1 capsule  by mouth daily.      . clorazepate (TRANXENE) 7.5 MG tablet Take 3.75 mg by mouth 2 (two) times daily as needed (muscle).    . furosemide (LASIX) 20 MG tablet Take 1 tablet (20 mg total) by mouth daily. (Patient taking differently: Take 20 mg by mouth daily as needed for fluid. ) 30 tablet 2  . HYDROcodone-homatropine (HYCODAN) 5-1.5 MG/5ML syrup Take 5 mLs by mouth every 8 (eight) hours as needed for cough. 240 mL 0  . hydrocortisone (ANUSOL-HC) 25 MG suppository Place 25 mg rectally as needed for hemorrhoids.     . hydroxypropyl methylcellulose (ISOPTO TEARS) 2.5 % ophthalmic solution Place 1 drop into both eyes as needed for dry eyes.     Marland Kitchen levothyroxine (SYNTHROID, LEVOTHROID) 75 MCG tablet Take 75 mcg by mouth daily.      . Magnesium 250 MG TABS Take 250 mg by mouth daily.    . magnesium hydroxide (MILK OF MAGNESIA) 400 MG/5ML suspension Take by mouth daily as needed for mild constipation.    . Multiple Vitamin (MULTIVITAMIN) capsule Take 1 capsule by mouth daily.      . Multiple Vitamins-Minerals (ICAPS AREDS 2) CAPS Take 1 capsule by mouth daily.    . nitroGLYCERIN (NITROSTAT) 0.4 MG SL tablet Place 0.4 mg under the tongue every 5 (five) minutes as needed for chest pain.     Marland Kitchen oxybutynin (DITROPAN) 5 MG tablet Take 5 mg by mouth  daily. Reported on 02/14/2016    . OXYGEN Inhale 2 L into the lungs at bedtime.     Vladimir Faster Glycol-Propyl Glycol (SYSTANE) 0.4-0.3 % SOLN Place 1 drop into both eyes 3 (three) times daily.     . polyethylene glycol (MIRALAX / GLYCOLAX) packet Take 17 g by mouth daily as needed for mild constipation.     . Probiotic CAPS Take 1 capsule by mouth daily.     No current facility-administered medications on file prior to visit.      Review of Systems Constitutional:   No  weight loss, night sweats,  Fevers, chills,  +fatigue, or  lassitude.  HEENT:   No headaches,  Difficulty swallowing,  Tooth/dental problems, or  Sore throat,                No sneezing,  itching, ear ache, nasal congestion, post nasal drip,   CV:  No chest pain,  Orthopnea, PND, swelling in lower extremities, anasarca, dizziness, palpitations, syncope.   GI  No heartburn, indigestion, abdominal pain, nausea, vomiting, diarrhea, change in bowel habits, loss of appetite, bloody stools.   Resp: .  No chest wall deformity  Skin: no rash or lesions.  GU: no dysuria, change in color of urine, no urgency or frequency.  No flank pain, no hematuria   MS:  No joint pain or swelling.  No decreased range of motion.  No back pain.  Psych:  No change in mood or affect. No depression or anxiety.  No memory loss.         Objective:   Physical Exam Vitals:   03/05/16 1418  BP: 126/72  Pulse: (!) 56  Temp: 98.2 F (36.8 C)  TempSrc: Oral  SpO2: 95%  Weight: 139 lb (63 kg)  Height: 5\' 7"  (1.702 m)   GEN: A/Ox3; pleasant , NAD,Fully   HEENT:  [place]/AT,  EACs-clear, TMs-wnl, NOSE-clear, THROAT-clear, no lesions, no postnasal drip or exudate noted.   NECK:  Supple w/ fair ROM; no JVD; normal carotid impulses w/o bruits; no thyromegaly or nodules palpated; no lymphadenopathy.    RESP  decreased breath sounds in the bases  no accessory muscle use, no dullness to percussion  CARD:  RRR, no m/r/g  , no peripheral edema, pulses intact, no cyanosis or clubbing.  GI:   Soft & nt; nml bowel sounds; no organomegaly or masses detected.   Musco: Warm bil, no deformities or joint swelling noted.   Neuro: alert, no focal deficits noted.    Skin: Warm, no lesions or rashes   Zeph Riebel NP-C  [facility]  03/05/16       Assessment & Plan:

## 2016-03-06 NOTE — Assessment & Plan Note (Signed)
Recent flare -slowly resolving   Plan  Recent flare now resolving   Plan  Continue with flutter valve 2-3 times daily .  Continue on current regimen .  Follow up Dr. [place] Bells in 3 months and As needed

## 2016-03-17 ENCOUNTER — Other Ambulatory Visit: Payer: Self-pay | Admitting: Pulmonary Disease

## 2016-03-31 DIAGNOSIS — M75121 Complete rotator cuff tear or rupture of right shoulder, not specified as traumatic: Secondary | ICD-10-CM | POA: Diagnosis not present

## 2016-04-13 ENCOUNTER — Ambulatory Visit: Payer: Medicare Other | Admitting: Pulmonary Disease

## 2016-04-27 DIAGNOSIS — S80921A Unspecified superficial injury of right lower leg, initial encounter: Secondary | ICD-10-CM | POA: Diagnosis not present

## 2016-04-27 DIAGNOSIS — L0889 Other specified local infections of the skin and subcutaneous tissue: Secondary | ICD-10-CM | POA: Diagnosis not present

## 2016-04-27 DIAGNOSIS — Z6822 Body mass index (BMI) 22.0-22.9, adult: Secondary | ICD-10-CM | POA: Diagnosis not present

## 2016-04-29 DIAGNOSIS — S81801A Unspecified open wound, right lower leg, initial encounter: Secondary | ICD-10-CM | POA: Diagnosis not present

## 2016-05-01 DIAGNOSIS — J449 Chronic obstructive pulmonary disease, unspecified: Secondary | ICD-10-CM | POA: Diagnosis not present

## 2016-05-01 DIAGNOSIS — I252 Old myocardial infarction: Secondary | ICD-10-CM | POA: Diagnosis not present

## 2016-05-01 DIAGNOSIS — I209 Angina pectoris, unspecified: Secondary | ICD-10-CM | POA: Diagnosis not present

## 2016-05-01 DIAGNOSIS — M5030 Other cervical disc degeneration, unspecified cervical region: Secondary | ICD-10-CM | POA: Diagnosis not present

## 2016-05-01 DIAGNOSIS — S81811A Laceration without foreign body, right lower leg, initial encounter: Secondary | ICD-10-CM | POA: Diagnosis not present

## 2016-05-01 DIAGNOSIS — M1711 Unilateral primary osteoarthritis, right knee: Secondary | ICD-10-CM | POA: Diagnosis not present

## 2016-05-04 DIAGNOSIS — I209 Angina pectoris, unspecified: Secondary | ICD-10-CM | POA: Diagnosis not present

## 2016-05-04 DIAGNOSIS — I252 Old myocardial infarction: Secondary | ICD-10-CM | POA: Diagnosis not present

## 2016-05-04 DIAGNOSIS — M5030 Other cervical disc degeneration, unspecified cervical region: Secondary | ICD-10-CM | POA: Diagnosis not present

## 2016-05-04 DIAGNOSIS — J449 Chronic obstructive pulmonary disease, unspecified: Secondary | ICD-10-CM | POA: Diagnosis not present

## 2016-05-04 DIAGNOSIS — M1711 Unilateral primary osteoarthritis, right knee: Secondary | ICD-10-CM | POA: Diagnosis not present

## 2016-05-04 DIAGNOSIS — S81811A Laceration without foreign body, right lower leg, initial encounter: Secondary | ICD-10-CM | POA: Diagnosis not present

## 2016-05-05 DIAGNOSIS — M1711 Unilateral primary osteoarthritis, right knee: Secondary | ICD-10-CM | POA: Diagnosis not present

## 2016-05-05 DIAGNOSIS — I252 Old myocardial infarction: Secondary | ICD-10-CM | POA: Diagnosis not present

## 2016-05-05 DIAGNOSIS — M5030 Other cervical disc degeneration, unspecified cervical region: Secondary | ICD-10-CM | POA: Diagnosis not present

## 2016-05-05 DIAGNOSIS — J449 Chronic obstructive pulmonary disease, unspecified: Secondary | ICD-10-CM | POA: Diagnosis not present

## 2016-05-05 DIAGNOSIS — I209 Angina pectoris, unspecified: Secondary | ICD-10-CM | POA: Diagnosis not present

## 2016-05-05 DIAGNOSIS — S81811A Laceration without foreign body, right lower leg, initial encounter: Secondary | ICD-10-CM | POA: Diagnosis not present

## 2016-05-06 DIAGNOSIS — S81801D Unspecified open wound, right lower leg, subsequent encounter: Secondary | ICD-10-CM | POA: Diagnosis not present

## 2016-05-07 DIAGNOSIS — J449 Chronic obstructive pulmonary disease, unspecified: Secondary | ICD-10-CM | POA: Diagnosis not present

## 2016-05-07 DIAGNOSIS — S81811A Laceration without foreign body, right lower leg, initial encounter: Secondary | ICD-10-CM | POA: Diagnosis not present

## 2016-05-07 DIAGNOSIS — M1711 Unilateral primary osteoarthritis, right knee: Secondary | ICD-10-CM | POA: Diagnosis not present

## 2016-05-07 DIAGNOSIS — I252 Old myocardial infarction: Secondary | ICD-10-CM | POA: Diagnosis not present

## 2016-05-07 DIAGNOSIS — I209 Angina pectoris, unspecified: Secondary | ICD-10-CM | POA: Diagnosis not present

## 2016-05-07 DIAGNOSIS — M5030 Other cervical disc degeneration, unspecified cervical region: Secondary | ICD-10-CM | POA: Diagnosis not present

## 2016-05-08 DIAGNOSIS — S81811A Laceration without foreign body, right lower leg, initial encounter: Secondary | ICD-10-CM | POA: Diagnosis not present

## 2016-05-08 DIAGNOSIS — J449 Chronic obstructive pulmonary disease, unspecified: Secondary | ICD-10-CM | POA: Diagnosis not present

## 2016-05-08 DIAGNOSIS — I209 Angina pectoris, unspecified: Secondary | ICD-10-CM | POA: Diagnosis not present

## 2016-05-08 DIAGNOSIS — M5030 Other cervical disc degeneration, unspecified cervical region: Secondary | ICD-10-CM | POA: Diagnosis not present

## 2016-05-08 DIAGNOSIS — M1711 Unilateral primary osteoarthritis, right knee: Secondary | ICD-10-CM | POA: Diagnosis not present

## 2016-05-08 DIAGNOSIS — I252 Old myocardial infarction: Secondary | ICD-10-CM | POA: Diagnosis not present

## 2016-05-11 DIAGNOSIS — M5030 Other cervical disc degeneration, unspecified cervical region: Secondary | ICD-10-CM | POA: Diagnosis not present

## 2016-05-11 DIAGNOSIS — I209 Angina pectoris, unspecified: Secondary | ICD-10-CM | POA: Diagnosis not present

## 2016-05-11 DIAGNOSIS — I252 Old myocardial infarction: Secondary | ICD-10-CM | POA: Diagnosis not present

## 2016-05-11 DIAGNOSIS — M1711 Unilateral primary osteoarthritis, right knee: Secondary | ICD-10-CM | POA: Diagnosis not present

## 2016-05-11 DIAGNOSIS — J449 Chronic obstructive pulmonary disease, unspecified: Secondary | ICD-10-CM | POA: Diagnosis not present

## 2016-05-11 DIAGNOSIS — S81811A Laceration without foreign body, right lower leg, initial encounter: Secondary | ICD-10-CM | POA: Diagnosis not present

## 2016-05-12 DIAGNOSIS — H35372 Puckering of macula, left eye: Secondary | ICD-10-CM | POA: Diagnosis not present

## 2016-05-12 DIAGNOSIS — H04123 Dry eye syndrome of bilateral lacrimal glands: Secondary | ICD-10-CM | POA: Diagnosis not present

## 2016-05-12 DIAGNOSIS — H40013 Open angle with borderline findings, low risk, bilateral: Secondary | ICD-10-CM | POA: Diagnosis not present

## 2016-05-12 DIAGNOSIS — H35342 Macular cyst, hole, or pseudohole, left eye: Secondary | ICD-10-CM | POA: Diagnosis not present

## 2016-05-12 DIAGNOSIS — H353120 Nonexudative age-related macular degeneration, left eye, stage unspecified: Secondary | ICD-10-CM | POA: Diagnosis not present

## 2016-05-13 DIAGNOSIS — M1711 Unilateral primary osteoarthritis, right knee: Secondary | ICD-10-CM | POA: Diagnosis not present

## 2016-05-13 DIAGNOSIS — J449 Chronic obstructive pulmonary disease, unspecified: Secondary | ICD-10-CM | POA: Diagnosis not present

## 2016-05-13 DIAGNOSIS — S81811A Laceration without foreign body, right lower leg, initial encounter: Secondary | ICD-10-CM | POA: Diagnosis not present

## 2016-05-13 DIAGNOSIS — I252 Old myocardial infarction: Secondary | ICD-10-CM | POA: Diagnosis not present

## 2016-05-13 DIAGNOSIS — M5030 Other cervical disc degeneration, unspecified cervical region: Secondary | ICD-10-CM | POA: Diagnosis not present

## 2016-05-13 DIAGNOSIS — I209 Angina pectoris, unspecified: Secondary | ICD-10-CM | POA: Diagnosis not present

## 2016-05-14 DIAGNOSIS — J449 Chronic obstructive pulmonary disease, unspecified: Secondary | ICD-10-CM | POA: Diagnosis not present

## 2016-05-14 DIAGNOSIS — M1711 Unilateral primary osteoarthritis, right knee: Secondary | ICD-10-CM | POA: Diagnosis not present

## 2016-05-14 DIAGNOSIS — I209 Angina pectoris, unspecified: Secondary | ICD-10-CM | POA: Diagnosis not present

## 2016-05-14 DIAGNOSIS — S81811A Laceration without foreign body, right lower leg, initial encounter: Secondary | ICD-10-CM | POA: Diagnosis not present

## 2016-05-14 DIAGNOSIS — M5030 Other cervical disc degeneration, unspecified cervical region: Secondary | ICD-10-CM | POA: Diagnosis not present

## 2016-05-14 DIAGNOSIS — I252 Old myocardial infarction: Secondary | ICD-10-CM | POA: Diagnosis not present

## 2016-05-19 DIAGNOSIS — S81811A Laceration without foreign body, right lower leg, initial encounter: Secondary | ICD-10-CM | POA: Diagnosis not present

## 2016-05-19 DIAGNOSIS — I209 Angina pectoris, unspecified: Secondary | ICD-10-CM | POA: Diagnosis not present

## 2016-05-19 DIAGNOSIS — M5030 Other cervical disc degeneration, unspecified cervical region: Secondary | ICD-10-CM | POA: Diagnosis not present

## 2016-05-19 DIAGNOSIS — J449 Chronic obstructive pulmonary disease, unspecified: Secondary | ICD-10-CM | POA: Diagnosis not present

## 2016-05-19 DIAGNOSIS — M1711 Unilateral primary osteoarthritis, right knee: Secondary | ICD-10-CM | POA: Diagnosis not present

## 2016-05-19 DIAGNOSIS — I252 Old myocardial infarction: Secondary | ICD-10-CM | POA: Diagnosis not present

## 2016-05-20 DIAGNOSIS — M1711 Unilateral primary osteoarthritis, right knee: Secondary | ICD-10-CM | POA: Diagnosis not present

## 2016-05-20 DIAGNOSIS — M5030 Other cervical disc degeneration, unspecified cervical region: Secondary | ICD-10-CM | POA: Diagnosis not present

## 2016-05-20 DIAGNOSIS — I252 Old myocardial infarction: Secondary | ICD-10-CM | POA: Diagnosis not present

## 2016-05-20 DIAGNOSIS — S81811A Laceration without foreign body, right lower leg, initial encounter: Secondary | ICD-10-CM | POA: Diagnosis not present

## 2016-05-20 DIAGNOSIS — I209 Angina pectoris, unspecified: Secondary | ICD-10-CM | POA: Diagnosis not present

## 2016-05-20 DIAGNOSIS — J449 Chronic obstructive pulmonary disease, unspecified: Secondary | ICD-10-CM | POA: Diagnosis not present

## 2016-05-21 DIAGNOSIS — J449 Chronic obstructive pulmonary disease, unspecified: Secondary | ICD-10-CM | POA: Diagnosis not present

## 2016-05-21 DIAGNOSIS — I252 Old myocardial infarction: Secondary | ICD-10-CM | POA: Diagnosis not present

## 2016-05-21 DIAGNOSIS — I209 Angina pectoris, unspecified: Secondary | ICD-10-CM | POA: Diagnosis not present

## 2016-05-21 DIAGNOSIS — S81811A Laceration without foreign body, right lower leg, initial encounter: Secondary | ICD-10-CM | POA: Diagnosis not present

## 2016-05-21 DIAGNOSIS — M5030 Other cervical disc degeneration, unspecified cervical region: Secondary | ICD-10-CM | POA: Diagnosis not present

## 2016-05-21 DIAGNOSIS — M1711 Unilateral primary osteoarthritis, right knee: Secondary | ICD-10-CM | POA: Diagnosis not present

## 2016-05-25 DIAGNOSIS — I252 Old myocardial infarction: Secondary | ICD-10-CM | POA: Diagnosis not present

## 2016-05-25 DIAGNOSIS — I209 Angina pectoris, unspecified: Secondary | ICD-10-CM | POA: Diagnosis not present

## 2016-05-25 DIAGNOSIS — M5030 Other cervical disc degeneration, unspecified cervical region: Secondary | ICD-10-CM | POA: Diagnosis not present

## 2016-05-25 DIAGNOSIS — S81811A Laceration without foreign body, right lower leg, initial encounter: Secondary | ICD-10-CM | POA: Diagnosis not present

## 2016-05-25 DIAGNOSIS — M1711 Unilateral primary osteoarthritis, right knee: Secondary | ICD-10-CM | POA: Diagnosis not present

## 2016-05-25 DIAGNOSIS — J449 Chronic obstructive pulmonary disease, unspecified: Secondary | ICD-10-CM | POA: Diagnosis not present

## 2016-05-26 DIAGNOSIS — I252 Old myocardial infarction: Secondary | ICD-10-CM | POA: Diagnosis not present

## 2016-05-26 DIAGNOSIS — M5030 Other cervical disc degeneration, unspecified cervical region: Secondary | ICD-10-CM | POA: Diagnosis not present

## 2016-05-26 DIAGNOSIS — J449 Chronic obstructive pulmonary disease, unspecified: Secondary | ICD-10-CM | POA: Diagnosis not present

## 2016-05-26 DIAGNOSIS — M1711 Unilateral primary osteoarthritis, right knee: Secondary | ICD-10-CM | POA: Diagnosis not present

## 2016-05-26 DIAGNOSIS — I209 Angina pectoris, unspecified: Secondary | ICD-10-CM | POA: Diagnosis not present

## 2016-05-26 DIAGNOSIS — S81811A Laceration without foreign body, right lower leg, initial encounter: Secondary | ICD-10-CM | POA: Diagnosis not present

## 2016-05-27 DIAGNOSIS — S81801D Unspecified open wound, right lower leg, subsequent encounter: Secondary | ICD-10-CM | POA: Diagnosis not present

## 2016-05-28 ENCOUNTER — Encounter (HOSPITAL_BASED_OUTPATIENT_CLINIC_OR_DEPARTMENT_OTHER): Payer: Medicare Other | Attending: Internal Medicine

## 2016-05-28 DIAGNOSIS — I509 Heart failure, unspecified: Secondary | ICD-10-CM | POA: Diagnosis not present

## 2016-05-28 DIAGNOSIS — I87321 Chronic venous hypertension (idiopathic) with inflammation of right lower extremity: Secondary | ICD-10-CM | POA: Diagnosis not present

## 2016-05-28 DIAGNOSIS — I739 Peripheral vascular disease, unspecified: Secondary | ICD-10-CM | POA: Diagnosis not present

## 2016-05-28 DIAGNOSIS — Z96641 Presence of right artificial hip joint: Secondary | ICD-10-CM | POA: Insufficient documentation

## 2016-05-28 DIAGNOSIS — L97811 Non-pressure chronic ulcer of other part of right lower leg limited to breakdown of skin: Secondary | ICD-10-CM | POA: Diagnosis not present

## 2016-05-28 DIAGNOSIS — Z23 Encounter for immunization: Secondary | ICD-10-CM | POA: Diagnosis not present

## 2016-05-28 DIAGNOSIS — G4730 Sleep apnea, unspecified: Secondary | ICD-10-CM | POA: Insufficient documentation

## 2016-05-28 DIAGNOSIS — L97812 Non-pressure chronic ulcer of other part of right lower leg with fat layer exposed: Secondary | ICD-10-CM | POA: Diagnosis not present

## 2016-05-28 DIAGNOSIS — I2510 Atherosclerotic heart disease of native coronary artery without angina pectoris: Secondary | ICD-10-CM | POA: Insufficient documentation

## 2016-05-28 DIAGNOSIS — M1990 Unspecified osteoarthritis, unspecified site: Secondary | ICD-10-CM | POA: Diagnosis not present

## 2016-05-28 DIAGNOSIS — H5440 Blindness, one eye, unspecified eye: Secondary | ICD-10-CM | POA: Insufficient documentation

## 2016-05-28 DIAGNOSIS — J449 Chronic obstructive pulmonary disease, unspecified: Secondary | ICD-10-CM | POA: Diagnosis not present

## 2016-05-28 DIAGNOSIS — M069 Rheumatoid arthritis, unspecified: Secondary | ICD-10-CM | POA: Diagnosis not present

## 2016-06-01 DIAGNOSIS — I209 Angina pectoris, unspecified: Secondary | ICD-10-CM | POA: Diagnosis not present

## 2016-06-01 DIAGNOSIS — I252 Old myocardial infarction: Secondary | ICD-10-CM | POA: Diagnosis not present

## 2016-06-01 DIAGNOSIS — M1711 Unilateral primary osteoarthritis, right knee: Secondary | ICD-10-CM | POA: Diagnosis not present

## 2016-06-01 DIAGNOSIS — J449 Chronic obstructive pulmonary disease, unspecified: Secondary | ICD-10-CM | POA: Diagnosis not present

## 2016-06-01 DIAGNOSIS — M5030 Other cervical disc degeneration, unspecified cervical region: Secondary | ICD-10-CM | POA: Diagnosis not present

## 2016-06-01 DIAGNOSIS — S81811A Laceration without foreign body, right lower leg, initial encounter: Secondary | ICD-10-CM | POA: Diagnosis not present

## 2016-06-03 DIAGNOSIS — M1711 Unilateral primary osteoarthritis, right knee: Secondary | ICD-10-CM | POA: Diagnosis not present

## 2016-06-03 DIAGNOSIS — I209 Angina pectoris, unspecified: Secondary | ICD-10-CM | POA: Diagnosis not present

## 2016-06-03 DIAGNOSIS — I252 Old myocardial infarction: Secondary | ICD-10-CM | POA: Diagnosis not present

## 2016-06-03 DIAGNOSIS — J449 Chronic obstructive pulmonary disease, unspecified: Secondary | ICD-10-CM | POA: Diagnosis not present

## 2016-06-03 DIAGNOSIS — S81811A Laceration without foreign body, right lower leg, initial encounter: Secondary | ICD-10-CM | POA: Diagnosis not present

## 2016-06-03 DIAGNOSIS — M5030 Other cervical disc degeneration, unspecified cervical region: Secondary | ICD-10-CM | POA: Diagnosis not present

## 2016-06-05 ENCOUNTER — Encounter (HOSPITAL_BASED_OUTPATIENT_CLINIC_OR_DEPARTMENT_OTHER): Payer: Medicare Other | Attending: Internal Medicine

## 2016-06-05 DIAGNOSIS — I87321 Chronic venous hypertension (idiopathic) with inflammation of right lower extremity: Secondary | ICD-10-CM | POA: Diagnosis not present

## 2016-06-05 DIAGNOSIS — J449 Chronic obstructive pulmonary disease, unspecified: Secondary | ICD-10-CM | POA: Insufficient documentation

## 2016-06-05 DIAGNOSIS — I2510 Atherosclerotic heart disease of native coronary artery without angina pectoris: Secondary | ICD-10-CM | POA: Diagnosis not present

## 2016-06-05 DIAGNOSIS — G4730 Sleep apnea, unspecified: Secondary | ICD-10-CM | POA: Insufficient documentation

## 2016-06-05 DIAGNOSIS — G629 Polyneuropathy, unspecified: Secondary | ICD-10-CM | POA: Diagnosis not present

## 2016-06-05 DIAGNOSIS — M069 Rheumatoid arthritis, unspecified: Secondary | ICD-10-CM | POA: Insufficient documentation

## 2016-06-05 DIAGNOSIS — S81811A Laceration without foreign body, right lower leg, initial encounter: Secondary | ICD-10-CM | POA: Diagnosis not present

## 2016-06-05 DIAGNOSIS — L97812 Non-pressure chronic ulcer of other part of right lower leg with fat layer exposed: Secondary | ICD-10-CM | POA: Insufficient documentation

## 2016-06-08 DIAGNOSIS — M1711 Unilateral primary osteoarthritis, right knee: Secondary | ICD-10-CM | POA: Diagnosis not present

## 2016-06-08 DIAGNOSIS — S81811A Laceration without foreign body, right lower leg, initial encounter: Secondary | ICD-10-CM | POA: Diagnosis not present

## 2016-06-08 DIAGNOSIS — I252 Old myocardial infarction: Secondary | ICD-10-CM | POA: Diagnosis not present

## 2016-06-08 DIAGNOSIS — M5030 Other cervical disc degeneration, unspecified cervical region: Secondary | ICD-10-CM | POA: Diagnosis not present

## 2016-06-08 DIAGNOSIS — J449 Chronic obstructive pulmonary disease, unspecified: Secondary | ICD-10-CM | POA: Diagnosis not present

## 2016-06-08 DIAGNOSIS — I209 Angina pectoris, unspecified: Secondary | ICD-10-CM | POA: Diagnosis not present

## 2016-06-10 DIAGNOSIS — S81811A Laceration without foreign body, right lower leg, initial encounter: Secondary | ICD-10-CM | POA: Diagnosis not present

## 2016-06-10 DIAGNOSIS — M5030 Other cervical disc degeneration, unspecified cervical region: Secondary | ICD-10-CM | POA: Diagnosis not present

## 2016-06-10 DIAGNOSIS — M1711 Unilateral primary osteoarthritis, right knee: Secondary | ICD-10-CM | POA: Diagnosis not present

## 2016-06-10 DIAGNOSIS — J449 Chronic obstructive pulmonary disease, unspecified: Secondary | ICD-10-CM | POA: Diagnosis not present

## 2016-06-10 DIAGNOSIS — I252 Old myocardial infarction: Secondary | ICD-10-CM | POA: Diagnosis not present

## 2016-06-10 DIAGNOSIS — I209 Angina pectoris, unspecified: Secondary | ICD-10-CM | POA: Diagnosis not present

## 2016-06-12 DIAGNOSIS — I2510 Atherosclerotic heart disease of native coronary artery without angina pectoris: Secondary | ICD-10-CM | POA: Diagnosis not present

## 2016-06-12 DIAGNOSIS — M069 Rheumatoid arthritis, unspecified: Secondary | ICD-10-CM | POA: Diagnosis not present

## 2016-06-12 DIAGNOSIS — J449 Chronic obstructive pulmonary disease, unspecified: Secondary | ICD-10-CM | POA: Diagnosis not present

## 2016-06-12 DIAGNOSIS — I87321 Chronic venous hypertension (idiopathic) with inflammation of right lower extremity: Secondary | ICD-10-CM | POA: Diagnosis not present

## 2016-06-12 DIAGNOSIS — G4730 Sleep apnea, unspecified: Secondary | ICD-10-CM | POA: Diagnosis not present

## 2016-06-12 DIAGNOSIS — L97812 Non-pressure chronic ulcer of other part of right lower leg with fat layer exposed: Secondary | ICD-10-CM | POA: Diagnosis not present

## 2016-06-12 DIAGNOSIS — I87311 Chronic venous hypertension (idiopathic) with ulcer of right lower extremity: Secondary | ICD-10-CM | POA: Diagnosis not present

## 2016-06-15 DIAGNOSIS — S81811A Laceration without foreign body, right lower leg, initial encounter: Secondary | ICD-10-CM | POA: Diagnosis not present

## 2016-06-15 DIAGNOSIS — M5030 Other cervical disc degeneration, unspecified cervical region: Secondary | ICD-10-CM | POA: Diagnosis not present

## 2016-06-15 DIAGNOSIS — M1711 Unilateral primary osteoarthritis, right knee: Secondary | ICD-10-CM | POA: Diagnosis not present

## 2016-06-15 DIAGNOSIS — I209 Angina pectoris, unspecified: Secondary | ICD-10-CM | POA: Diagnosis not present

## 2016-06-15 DIAGNOSIS — I252 Old myocardial infarction: Secondary | ICD-10-CM | POA: Diagnosis not present

## 2016-06-15 DIAGNOSIS — J449 Chronic obstructive pulmonary disease, unspecified: Secondary | ICD-10-CM | POA: Diagnosis not present

## 2016-06-16 ENCOUNTER — Encounter: Payer: Self-pay | Admitting: Pulmonary Disease

## 2016-06-16 ENCOUNTER — Ambulatory Visit (INDEPENDENT_AMBULATORY_CARE_PROVIDER_SITE_OTHER): Payer: Medicare Other | Admitting: Pulmonary Disease

## 2016-06-16 VITALS — BP 136/74 | HR 88 | Ht 67.0 in | Wt 133.8 lb

## 2016-06-16 DIAGNOSIS — J479 Bronchiectasis, uncomplicated: Secondary | ICD-10-CM | POA: Diagnosis not present

## 2016-06-16 MED ORDER — BUDESONIDE-FORMOTEROL FUMARATE 160-4.5 MCG/ACT IN AERO: 2.0000 | INHALATION_SPRAY | Freq: Two times a day (BID) | RESPIRATORY_TRACT | 0 refills | Status: DC

## 2016-06-16 MED ORDER — SODIUM CHLORIDE 3 % IN NEBU: INHALATION_SOLUTION | Freq: Two times a day (BID) | RESPIRATORY_TRACT | 5 refills | Status: DC

## 2016-06-16 NOTE — Assessment & Plan Note (Signed)
I am concerned about [name]. Specifically, she's had multiple flareups of her bronchiectasis this year and she seems to have an increased burden of chest congestion and shortness of breath in the last several months. This worries me that her bronchiectasis is worsening. I also worry about the possibility of an occult atypical infection which is yet to be identified.  Plan: Sputum culture for AFB, fungal, bacteria At hypertonic saline to enhance mucociliary clearance (flutter valve has failed) Continue albuterol twice a day Add Symbicort twice a day Follow-up in 4-6 weeks to go over the results of the culture and see if she's feeling better  Greater than 50% of this 30 minute visit spent face-to-face

## 2016-06-16 NOTE — Patient Instructions (Signed)
Use the hypertonic saline twice a day to help you clear mucus Take the Symbicort 2 puffs twice a day no matter how you feel Keep taking albuterol as you're doing Provide [place] with a sample of your mucus I will see you back in 4-6 weeks to see how things are going

## 2016-06-16 NOTE — Progress Notes (Signed)
Subjective:    Patient ID: [name], female    DOB: 11/06/1926, 82 y.o.   MRN: [id number]   Synopsis: Former patient of Dr. Gwenette Day with bronchiectasis CT chest 2012:  Bronchiectasis in RML, lingula, LLL. PFT's 06/2011: +airtrapping, no restriction, DLCO 76% pred.  Sputum 06/2011: normal flora, no AFB seen Serum Ig's 2012: normal levels.  No change in breathing or mucus with a trial of arcapta or dulera.  Sputum CS 04/2012:  pansensitive pseudomonas Uses albuterol for mucociliary clearance, flutter doesn't help  HPI Chief Complaint  Patient presents with  . Follow-up    pt c/o increased SOB, attributes this to anxiety.  Pt also has a prod cough with yellow mucus.     [name] has a wound in her R leg that has been bothering her and she has been undergoing wound debridement and dressing changes at her house right now.    She was told that she had some wheezing recently.  She says that she uses her oxygen all night and she has been using her inhalers twice a day and sometimes she feels like she needs them more often due to dyspnea and the feeling of chest congestion. She has seen the same amount of mucus production but she continues to have a feeling of congestion.  No blood in the yellow mucus she has seen lately.  She has been free of blood in her mucus for "a right good while".  She has some pain in her left flank and right flank from time to time.  She says that this is often associated with cough.  No recent fever or chills and her weight has been stable.    Past Medical History:  Diagnosis Date  . Anxiety   . Arthritis   . CHF (congestive heart failure) ([place])   . Complication of anesthesia    " I have to have a spinal beacuse my lungs & heart are bad "  . COPD (chronic obstructive pulmonary disease) ([place])   . Coronary artery disease   . Depression   . Fibromyalgia   . Hiatal hernia   . Hyperlipidemia   . Irregular heartbeat   . Osteoporosis   . Rotator cuff tear    RIGHT  . UTI (urinary tract infection) 09/2014      Review of Systems     Objective:   Physical Exam Vitals:   06/16/16 1424  BP: 136/74  Pulse: 88  SpO2: 97%  Weight: 133 lb 12.8 oz (60.7 kg)  Height: 5\' 7"  (1.702 m)  RA  Gen: well appearing HENT: OP clear, TM's clear, neck supple PULM: Crackles bases, wheeze left, good air movement CV: RRR, no mgr, trace edema GI: BS+, soft, nontender Derm: no cyanosis or rash Psyche: normal mood and affect  Sputum culture 05/2015 showed OPF which is typical for her sputum cultures  Records from where she was seen by my partners for flares of COPD recently reviewed  August 2017 chest x-ray images personally reviewed showing kyphosis, flattened out of the diaphragms, and chronic changes consistent with bronchiectasis.     Assessment & Plan:  Bronchiectasis without acute exacerbation I am concerned about [name]. Specifically, she's had multiple flareups of her bronchiectasis this year and she seems to have an increased burden of chest congestion and shortness of breath in the last several months. This worries me that her bronchiectasis is worsening. I also worry about the possibility of an occult atypical infection which is yet to  be identified.  Plan: Sputum culture for AFB, fungal, bacteria At hypertonic saline to enhance mucociliary clearance (flutter valve has failed) Continue albuterol twice a day Add Symbicort twice a day Follow-up in 4-6 weeks to go over the results of the culture and see if she's feeling better  Greater than 50% of this 30 minute visit spent face-to-face     Current Outpatient Prescriptions:  .  acetaminophen (TYLENOL) 500 MG tablet, Take 2 tablets (1,000 mg total) by mouth every 8 (eight) hours. (Patient taking differently: Take 1,000 mg by mouth every 8 (eight) hours as needed for mild pain or headache. ), Disp: 30 tablet, Rfl: 0 .  albuterol (PROVENTIL HFA;VENTOLIN HFA) 108 (90 Base) MCG/ACT  inhaler, Inhale 2 puffs into the lungs every 6 (six) hours as needed., Disp: 1 Inhaler, Rfl: 5 .  calcium carbonate (TUMS - DOSED IN MG ELEMENTAL CALCIUM) 500 MG chewable tablet, Chew 2 tablets by mouth daily as needed (acid reflux)., Disp: , Rfl:  .  Cholecalciferol (VITAMIN D3) 1000 UNITS CAPS, Take 1 capsule by mouth daily.  , Disp: , Rfl:  .  clorazepate (TRANXENE) 7.5 MG tablet, Take 3.75 mg by mouth 2 (two) times daily as needed (muscle)., Disp: , Rfl:  .  furosemide (LASIX) 20 MG tablet, TAKE ONE TABLET BY MOUTH ONCE DAILY, Disp: 30 tablet, Rfl: 2 .  HYDROcodone-homatropine (HYCODAN) 5-1.5 MG/5ML syrup, Take 5 mLs by mouth every 8 (eight) hours as needed for cough., Disp: 240 mL, Rfl: 0 .  hydrocortisone (ANUSOL-HC) 25 MG suppository, Place 25 mg rectally as needed for hemorrhoids. , Disp: , Rfl:  .  hydroxypropyl methylcellulose (ISOPTO TEARS) 2.5 % ophthalmic solution, Place 1 drop into both eyes as needed for dry eyes. , Disp: , Rfl:  .  levothyroxine (SYNTHROID, LEVOTHROID) 75 MCG tablet, Take 75 mcg by mouth daily.  , Disp: , Rfl:  .  Magnesium 250 MG TABS, Take 250 mg by mouth daily., Disp: , Rfl:  .  magnesium hydroxide (MILK OF MAGNESIA) 400 MG/5ML suspension, Take by mouth daily as needed for mild constipation., Disp: , Rfl:  .  Multiple Vitamin (MULTIVITAMIN) capsule, Take 1 capsule by mouth daily.  , Disp: , Rfl:  .  Multiple Vitamins-Minerals (ICAPS AREDS 2) CAPS, Take 1 capsule by mouth daily., Disp: , Rfl:  .  nitroGLYCERIN (NITROSTAT) 0.4 MG SL tablet, Place 0.4 mg under the tongue every 5 (five) minutes as needed for chest pain. , Disp: , Rfl:  .  oxybutynin (DITROPAN) 5 MG tablet, Take 5 mg by mouth daily. Reported on 02/14/2016, Disp: , Rfl:  .  OXYGEN, Inhale 2 L into the lungs at bedtime. , Disp: , Rfl:  .  Polyethyl Glycol-Propyl Glycol (SYSTANE) 0.4-0.3 % SOLN, Place 1 drop into both eyes 3 (three) times daily. , Disp: , Rfl:  .  polyethylene glycol (MIRALAX / GLYCOLAX)  packet, Take 17 g by mouth daily as needed for mild constipation. , Disp: , Rfl:  .  Probiotic CAPS, Take 1 capsule by mouth daily., Disp: , Rfl:

## 2016-06-18 ENCOUNTER — Telehealth: Payer: Self-pay | Admitting: Pulmonary Disease

## 2016-06-18 DIAGNOSIS — M1711 Unilateral primary osteoarthritis, right knee: Secondary | ICD-10-CM | POA: Diagnosis not present

## 2016-06-18 DIAGNOSIS — J449 Chronic obstructive pulmonary disease, unspecified: Secondary | ICD-10-CM | POA: Diagnosis not present

## 2016-06-18 DIAGNOSIS — I252 Old myocardial infarction: Secondary | ICD-10-CM | POA: Diagnosis not present

## 2016-06-18 DIAGNOSIS — I209 Angina pectoris, unspecified: Secondary | ICD-10-CM | POA: Diagnosis not present

## 2016-06-18 DIAGNOSIS — M5030 Other cervical disc degeneration, unspecified cervical region: Secondary | ICD-10-CM | POA: Diagnosis not present

## 2016-06-18 DIAGNOSIS — S81811A Laceration without foreign body, right lower leg, initial encounter: Secondary | ICD-10-CM | POA: Diagnosis not present

## 2016-06-18 NOTE — Telephone Encounter (Signed)
Called and spoke with pt and she is aware that [facility] did receive the order for the nebulizer and supplies.  She is aware that they will contact her to set up a time for her to get this.

## 2016-06-18 NOTE — Telephone Encounter (Signed)
Patient called [facility] and they told her that order for nebulizer/supplies was never received. Per referral note Melissa confirmed receipt of order today. Patient can be reached at [phone] -pr

## 2016-06-19 ENCOUNTER — Other Ambulatory Visit: Payer: Medicare Other

## 2016-06-19 DIAGNOSIS — I87321 Chronic venous hypertension (idiopathic) with inflammation of right lower extremity: Secondary | ICD-10-CM | POA: Diagnosis not present

## 2016-06-19 DIAGNOSIS — J449 Chronic obstructive pulmonary disease, unspecified: Secondary | ICD-10-CM | POA: Diagnosis not present

## 2016-06-19 DIAGNOSIS — G4730 Sleep apnea, unspecified: Secondary | ICD-10-CM | POA: Diagnosis not present

## 2016-06-19 DIAGNOSIS — L97812 Non-pressure chronic ulcer of other part of right lower leg with fat layer exposed: Secondary | ICD-10-CM | POA: Diagnosis not present

## 2016-06-19 DIAGNOSIS — S81811A Laceration without foreign body, right lower leg, initial encounter: Secondary | ICD-10-CM | POA: Diagnosis not present

## 2016-06-19 DIAGNOSIS — J479 Bronchiectasis, uncomplicated: Secondary | ICD-10-CM | POA: Diagnosis not present

## 2016-06-19 DIAGNOSIS — I2510 Atherosclerotic heart disease of native coronary artery without angina pectoris: Secondary | ICD-10-CM | POA: Diagnosis not present

## 2016-06-19 DIAGNOSIS — M069 Rheumatoid arthritis, unspecified: Secondary | ICD-10-CM | POA: Diagnosis not present

## 2016-06-22 DIAGNOSIS — J449 Chronic obstructive pulmonary disease, unspecified: Secondary | ICD-10-CM | POA: Diagnosis not present

## 2016-06-22 DIAGNOSIS — I209 Angina pectoris, unspecified: Secondary | ICD-10-CM | POA: Diagnosis not present

## 2016-06-22 DIAGNOSIS — M1711 Unilateral primary osteoarthritis, right knee: Secondary | ICD-10-CM | POA: Diagnosis not present

## 2016-06-22 DIAGNOSIS — I252 Old myocardial infarction: Secondary | ICD-10-CM | POA: Diagnosis not present

## 2016-06-22 DIAGNOSIS — M5030 Other cervical disc degeneration, unspecified cervical region: Secondary | ICD-10-CM | POA: Diagnosis not present

## 2016-06-22 DIAGNOSIS — S81811A Laceration without foreign body, right lower leg, initial encounter: Secondary | ICD-10-CM | POA: Diagnosis not present

## 2016-06-22 LAB — RESPIRATORY CULTURE OR RESPIRATORY AND SPUTUM CULTURE

## 2016-06-23 ENCOUNTER — Telehealth: Payer: Self-pay | Admitting: Pulmonary Disease

## 2016-06-23 ENCOUNTER — Other Ambulatory Visit: Payer: Self-pay

## 2016-06-23 MED ORDER — LEVOFLOXACIN 500 MG PO TABS: 500.0000 mg | ORAL_TABLET | Freq: Every day | ORAL | 0 refills | Status: DC

## 2016-06-23 NOTE — Telephone Encounter (Signed)
Dr. Lake Bells  Please advise  The only pharmacy that will carry the Sodium Cloride neb solution you want this pt. To use is [facility], I have spoken to this pt. And informed her and she wanted to find out from you were you going to  prescribe something else because she was not sure about making that drive here to get that medication from where she is located.

## 2016-06-24 DIAGNOSIS — M5030 Other cervical disc degeneration, unspecified cervical region: Secondary | ICD-10-CM | POA: Diagnosis not present

## 2016-06-24 DIAGNOSIS — I252 Old myocardial infarction: Secondary | ICD-10-CM | POA: Diagnosis not present

## 2016-06-24 DIAGNOSIS — I209 Angina pectoris, unspecified: Secondary | ICD-10-CM | POA: Diagnosis not present

## 2016-06-24 DIAGNOSIS — J449 Chronic obstructive pulmonary disease, unspecified: Secondary | ICD-10-CM | POA: Diagnosis not present

## 2016-06-24 DIAGNOSIS — M1711 Unilateral primary osteoarthritis, right knee: Secondary | ICD-10-CM | POA: Diagnosis not present

## 2016-06-24 DIAGNOSIS — S81811A Laceration without foreign body, right lower leg, initial encounter: Secondary | ICD-10-CM | POA: Diagnosis not present

## 2016-06-24 NOTE — Telephone Encounter (Signed)
Spoke to [name] states they will not be able to mail this medication. [name] states this medication will have to be ordered and will not be in until Friday. I have made pt aware of this, pt states she is unable to make the drive to [place] to pick this Rx up. Pt states she will like to hold off on this for now. Nothing further needed.  I will route this to BQ for a FYI.

## 2016-06-24 NOTE — Telephone Encounter (Signed)
There aren't any other nebulizer medicines that will work for this purpose.  Can the pharmacy mail the Rx to her?

## 2016-06-29 DIAGNOSIS — J449 Chronic obstructive pulmonary disease, unspecified: Secondary | ICD-10-CM | POA: Diagnosis not present

## 2016-06-29 DIAGNOSIS — I209 Angina pectoris, unspecified: Secondary | ICD-10-CM | POA: Diagnosis not present

## 2016-06-29 DIAGNOSIS — M5030 Other cervical disc degeneration, unspecified cervical region: Secondary | ICD-10-CM | POA: Diagnosis not present

## 2016-06-29 DIAGNOSIS — S81811A Laceration without foreign body, right lower leg, initial encounter: Secondary | ICD-10-CM | POA: Diagnosis not present

## 2016-06-29 DIAGNOSIS — M1711 Unilateral primary osteoarthritis, right knee: Secondary | ICD-10-CM | POA: Diagnosis not present

## 2016-06-29 DIAGNOSIS — I252 Old myocardial infarction: Secondary | ICD-10-CM | POA: Diagnosis not present

## 2016-06-29 NOTE — Telephone Encounter (Signed)
Ask if there is another family member who can pick this up for her

## 2016-06-29 NOTE — Telephone Encounter (Signed)
Attempted to contact pt. Line rang busy x2. Will try back. 

## 2016-06-30 DIAGNOSIS — S81811D Laceration without foreign body, right lower leg, subsequent encounter: Secondary | ICD-10-CM | POA: Diagnosis not present

## 2016-06-30 DIAGNOSIS — I5032 Chronic diastolic (congestive) heart failure: Secondary | ICD-10-CM | POA: Diagnosis not present

## 2016-06-30 DIAGNOSIS — F411 Generalized anxiety disorder: Secondary | ICD-10-CM | POA: Diagnosis not present

## 2016-06-30 DIAGNOSIS — R079 Chest pain, unspecified: Secondary | ICD-10-CM | POA: Diagnosis not present

## 2016-06-30 DIAGNOSIS — J449 Chronic obstructive pulmonary disease, unspecified: Secondary | ICD-10-CM | POA: Diagnosis not present

## 2016-06-30 DIAGNOSIS — M1711 Unilateral primary osteoarthritis, right knee: Secondary | ICD-10-CM | POA: Diagnosis not present

## 2016-06-30 DIAGNOSIS — I359 Nonrheumatic aortic valve disorder, unspecified: Secondary | ICD-10-CM | POA: Diagnosis not present

## 2016-06-30 DIAGNOSIS — I872 Venous insufficiency (chronic) (peripheral): Secondary | ICD-10-CM | POA: Diagnosis not present

## 2016-06-30 DIAGNOSIS — I87321 Chronic venous hypertension (idiopathic) with inflammation of right lower extremity: Secondary | ICD-10-CM | POA: Diagnosis not present

## 2016-06-30 DIAGNOSIS — I209 Angina pectoris, unspecified: Secondary | ICD-10-CM | POA: Diagnosis not present

## 2016-06-30 DIAGNOSIS — M5030 Other cervical disc degeneration, unspecified cervical region: Secondary | ICD-10-CM | POA: Diagnosis not present

## 2016-06-30 DIAGNOSIS — M797 Fibromyalgia: Secondary | ICD-10-CM | POA: Diagnosis not present

## 2016-06-30 DIAGNOSIS — R0600 Dyspnea, unspecified: Secondary | ICD-10-CM | POA: Diagnosis not present

## 2016-06-30 NOTE — Telephone Encounter (Signed)
If pt agrees to get this medication I can pick this up for her and take it to her.  I attempted to call the pt but her line was busy.

## 2016-07-01 MED ORDER — SODIUM CHLORIDE 3 % IN NEBU: INHALATION_SOLUTION | Freq: Two times a day (BID) | RESPIRATORY_TRACT | 5 refills | Status: DC

## 2016-07-01 NOTE — Telephone Encounter (Signed)
Attempted to contact the pt and the line is ringing busy.  Will try back later.

## 2016-07-01 NOTE — Telephone Encounter (Signed)
Spoke with the pt  She states will be in town on Friday and will go to [place] to pick up med  I have sent rx in  Nothing further needed

## 2016-07-02 ENCOUNTER — Telehealth: Payer: Self-pay | Admitting: Pulmonary Disease

## 2016-07-02 DIAGNOSIS — M1711 Unilateral primary osteoarthritis, right knee: Secondary | ICD-10-CM | POA: Diagnosis not present

## 2016-07-02 DIAGNOSIS — I209 Angina pectoris, unspecified: Secondary | ICD-10-CM | POA: Diagnosis not present

## 2016-07-02 DIAGNOSIS — I87321 Chronic venous hypertension (idiopathic) with inflammation of right lower extremity: Secondary | ICD-10-CM | POA: Diagnosis not present

## 2016-07-02 DIAGNOSIS — M5030 Other cervical disc degeneration, unspecified cervical region: Secondary | ICD-10-CM | POA: Diagnosis not present

## 2016-07-02 DIAGNOSIS — J449 Chronic obstructive pulmonary disease, unspecified: Secondary | ICD-10-CM | POA: Diagnosis not present

## 2016-07-02 DIAGNOSIS — S81811D Laceration without foreign body, right lower leg, subsequent encounter: Secondary | ICD-10-CM | POA: Diagnosis not present

## 2016-07-02 NOTE — Telephone Encounter (Signed)
Spoke with the pt  She states she was seen by cards yesterday and they told her that her lungs did not sound very clear  She is not having any increased cough and not coughing up any sputum at this time  She states she feels the same as she did at last ov  She has just finished levaquin that we called in on 06/23/16  She has ov to f/u here in Jan 2018  I advised to call and let us know if she does start to have increased cough, SOB, congestion, fever  She states will report to [place] if she starts coughing up purulent sputum or having any of the above symptoms  Dr Lake Bells, is there anything further needed?  Please advise, thanks!

## 2016-07-03 ENCOUNTER — Encounter (HOSPITAL_BASED_OUTPATIENT_CLINIC_OR_DEPARTMENT_OTHER): Payer: Medicare Other | Attending: Internal Medicine

## 2016-07-03 DIAGNOSIS — I2510 Atherosclerotic heart disease of native coronary artery without angina pectoris: Secondary | ICD-10-CM | POA: Diagnosis not present

## 2016-07-03 DIAGNOSIS — G629 Polyneuropathy, unspecified: Secondary | ICD-10-CM | POA: Diagnosis not present

## 2016-07-03 DIAGNOSIS — I87321 Chronic venous hypertension (idiopathic) with inflammation of right lower extremity: Secondary | ICD-10-CM | POA: Insufficient documentation

## 2016-07-03 DIAGNOSIS — G4730 Sleep apnea, unspecified: Secondary | ICD-10-CM | POA: Insufficient documentation

## 2016-07-03 DIAGNOSIS — M069 Rheumatoid arthritis, unspecified: Secondary | ICD-10-CM | POA: Insufficient documentation

## 2016-07-03 DIAGNOSIS — W228XXA Striking against or struck by other objects, initial encounter: Secondary | ICD-10-CM | POA: Insufficient documentation

## 2016-07-03 DIAGNOSIS — S81811A Laceration without foreign body, right lower leg, initial encounter: Secondary | ICD-10-CM | POA: Insufficient documentation

## 2016-07-03 DIAGNOSIS — J449 Chronic obstructive pulmonary disease, unspecified: Secondary | ICD-10-CM | POA: Insufficient documentation

## 2016-07-03 DIAGNOSIS — S81811D Laceration without foreign body, right lower leg, subsequent encounter: Secondary | ICD-10-CM | POA: Diagnosis not present

## 2016-07-03 DIAGNOSIS — I872 Venous insufficiency (chronic) (peripheral): Secondary | ICD-10-CM | POA: Diagnosis not present

## 2016-07-03 DIAGNOSIS — S81801A Unspecified open wound, right lower leg, initial encounter: Secondary | ICD-10-CM | POA: Diagnosis not present

## 2016-07-06 DIAGNOSIS — J449 Chronic obstructive pulmonary disease, unspecified: Secondary | ICD-10-CM | POA: Diagnosis not present

## 2016-07-06 DIAGNOSIS — I87321 Chronic venous hypertension (idiopathic) with inflammation of right lower extremity: Secondary | ICD-10-CM | POA: Diagnosis not present

## 2016-07-06 DIAGNOSIS — M1711 Unilateral primary osteoarthritis, right knee: Secondary | ICD-10-CM | POA: Diagnosis not present

## 2016-07-06 DIAGNOSIS — S81811D Laceration without foreign body, right lower leg, subsequent encounter: Secondary | ICD-10-CM | POA: Diagnosis not present

## 2016-07-06 DIAGNOSIS — I209 Angina pectoris, unspecified: Secondary | ICD-10-CM | POA: Diagnosis not present

## 2016-07-06 DIAGNOSIS — M5030 Other cervical disc degeneration, unspecified cervical region: Secondary | ICD-10-CM | POA: Diagnosis not present

## 2016-07-06 NOTE — Telephone Encounter (Signed)
LMOMTCB x 1 

## 2016-07-06 NOTE — Telephone Encounter (Signed)
Nothing further needed unless she develops respiratory symptoms

## 2016-07-06 NOTE — Telephone Encounter (Signed)
[phone] pt calling back

## 2016-07-06 NOTE — Telephone Encounter (Signed)
Spoke with pt, aware of below recs.  Nothing further needed.  

## 2016-07-08 ENCOUNTER — Telehealth: Payer: Self-pay | Admitting: Pulmonary Disease

## 2016-07-08 MED ORDER — LEVOFLOXACIN 750 MG PO TABS: 750.0000 mg | ORAL_TABLET | Freq: Every day | ORAL | 0 refills | Status: DC

## 2016-07-08 NOTE — Telephone Encounter (Signed)
Spoke with the pt  She states started coughing up dark yellow/grey sputum this morning  She states that when she is able to get it out, it's quite a bit  She states that she was vomiting yesterday, and believes that she this came from not being able to cough anything up  Her stomach feels better today She denies any f/c/s Please advise thanks!

## 2016-07-08 NOTE — Telephone Encounter (Signed)
Spoke with the pt and notified of recs per BQ  She verbalized understanding  Rx was sent to pharm

## 2016-07-08 NOTE — Telephone Encounter (Signed)
Pt calling back to check on status of med request and says she needs it called to [facility] in [place] please let pt know when this has been done.[name]''

## 2016-07-08 NOTE — Telephone Encounter (Signed)
Spoke with pt. And informed her that the message was sent to BQ, informed the pt. That it is just that he is in clinic, and we will reach back out to her once we receive his recom.s

## 2016-07-08 NOTE — Telephone Encounter (Signed)
levaquin 750 po daily x 7 days Call if worse Keep f/u appointment with [place]

## 2016-07-10 DIAGNOSIS — M069 Rheumatoid arthritis, unspecified: Secondary | ICD-10-CM | POA: Diagnosis not present

## 2016-07-10 DIAGNOSIS — I87321 Chronic venous hypertension (idiopathic) with inflammation of right lower extremity: Secondary | ICD-10-CM | POA: Diagnosis not present

## 2016-07-10 DIAGNOSIS — S81811A Laceration without foreign body, right lower leg, initial encounter: Secondary | ICD-10-CM | POA: Diagnosis not present

## 2016-07-10 DIAGNOSIS — G4730 Sleep apnea, unspecified: Secondary | ICD-10-CM | POA: Diagnosis not present

## 2016-07-10 DIAGNOSIS — S81811D Laceration without foreign body, right lower leg, subsequent encounter: Secondary | ICD-10-CM | POA: Diagnosis not present

## 2016-07-10 DIAGNOSIS — I2510 Atherosclerotic heart disease of native coronary artery without angina pectoris: Secondary | ICD-10-CM | POA: Diagnosis not present

## 2016-07-10 DIAGNOSIS — S81801D Unspecified open wound, right lower leg, subsequent encounter: Secondary | ICD-10-CM | POA: Diagnosis not present

## 2016-07-10 DIAGNOSIS — J449 Chronic obstructive pulmonary disease, unspecified: Secondary | ICD-10-CM | POA: Diagnosis not present

## 2016-07-13 DIAGNOSIS — S81811D Laceration without foreign body, right lower leg, subsequent encounter: Secondary | ICD-10-CM | POA: Diagnosis not present

## 2016-07-13 DIAGNOSIS — I87321 Chronic venous hypertension (idiopathic) with inflammation of right lower extremity: Secondary | ICD-10-CM | POA: Diagnosis not present

## 2016-07-13 DIAGNOSIS — M1711 Unilateral primary osteoarthritis, right knee: Secondary | ICD-10-CM | POA: Diagnosis not present

## 2016-07-13 DIAGNOSIS — M5030 Other cervical disc degeneration, unspecified cervical region: Secondary | ICD-10-CM | POA: Diagnosis not present

## 2016-07-13 DIAGNOSIS — I209 Angina pectoris, unspecified: Secondary | ICD-10-CM | POA: Diagnosis not present

## 2016-07-13 DIAGNOSIS — J449 Chronic obstructive pulmonary disease, unspecified: Secondary | ICD-10-CM | POA: Diagnosis not present

## 2016-07-21 LAB — FUNGUS CULTURE W SMEAR

## 2016-07-22 DIAGNOSIS — L814 Other melanin hyperpigmentation: Secondary | ICD-10-CM | POA: Diagnosis not present

## 2016-07-22 DIAGNOSIS — L570 Actinic keratosis: Secondary | ICD-10-CM | POA: Diagnosis not present

## 2016-07-22 DIAGNOSIS — L821 Other seborrheic keratosis: Secondary | ICD-10-CM | POA: Diagnosis not present

## 2016-07-22 DIAGNOSIS — D235 Other benign neoplasm of skin of trunk: Secondary | ICD-10-CM | POA: Diagnosis not present

## 2016-08-11 LAB — AFB CULTURE WITH SMEAR (NOT AT ARMC)

## 2016-08-20 ENCOUNTER — Ambulatory Visit: Payer: Medicare Other | Admitting: Pulmonary Disease

## 2016-08-31 DIAGNOSIS — R102 Pelvic and perineal pain: Secondary | ICD-10-CM | POA: Diagnosis not present

## 2016-08-31 DIAGNOSIS — D500 Iron deficiency anemia secondary to blood loss (chronic): Secondary | ICD-10-CM | POA: Diagnosis not present

## 2016-08-31 DIAGNOSIS — J479 Bronchiectasis, uncomplicated: Secondary | ICD-10-CM | POA: Diagnosis not present

## 2016-08-31 DIAGNOSIS — R51 Headache: Secondary | ICD-10-CM | POA: Diagnosis not present

## 2016-08-31 DIAGNOSIS — R55 Syncope and collapse: Secondary | ICD-10-CM | POA: Diagnosis not present

## 2016-08-31 DIAGNOSIS — I5031 Acute diastolic (congestive) heart failure: Secondary | ICD-10-CM | POA: Diagnosis not present

## 2016-08-31 DIAGNOSIS — N3281 Overactive bladder: Secondary | ICD-10-CM | POA: Diagnosis not present

## 2016-08-31 DIAGNOSIS — E538 Deficiency of other specified B group vitamins: Secondary | ICD-10-CM | POA: Diagnosis not present

## 2016-08-31 DIAGNOSIS — F418 Other specified anxiety disorders: Secondary | ICD-10-CM | POA: Diagnosis not present

## 2016-08-31 DIAGNOSIS — E038 Other specified hypothyroidism: Secondary | ICD-10-CM | POA: Diagnosis not present

## 2016-08-31 DIAGNOSIS — M810 Age-related osteoporosis without current pathological fracture: Secondary | ICD-10-CM | POA: Diagnosis not present

## 2016-08-31 DIAGNOSIS — E784 Other hyperlipidemia: Secondary | ICD-10-CM | POA: Diagnosis not present

## 2016-09-02 ENCOUNTER — Telehealth: Payer: Self-pay | Admitting: Pulmonary Disease

## 2016-09-02 DIAGNOSIS — J479 Bronchiectasis, uncomplicated: Secondary | ICD-10-CM

## 2016-09-02 NOTE — Telephone Encounter (Signed)
Spoke with pt, who is requesting an order to be sent to [facility] for a POC.  BQ please advise. Thanks.

## 2016-09-03 NOTE — Telephone Encounter (Signed)
Spoke with pt, and made her aware that BQ gave the ok. The order was placed nothing further is needed at this time.

## 2016-09-03 NOTE — Telephone Encounter (Signed)
OK 

## 2016-09-07 DIAGNOSIS — N390 Urinary tract infection, site not specified: Secondary | ICD-10-CM | POA: Diagnosis not present

## 2016-09-07 DIAGNOSIS — R8290 Unspecified abnormal findings in urine: Secondary | ICD-10-CM | POA: Diagnosis not present

## 2016-09-08 ENCOUNTER — Telehealth: Payer: Self-pay | Admitting: Pulmonary Disease

## 2016-09-08 NOTE — Telephone Encounter (Signed)
OK to order O2 evaluation

## 2016-09-08 NOTE — Telephone Encounter (Signed)
Spoke with [place] at [facility].  They only have nocturnal oxygen order for pt from 04/2015.  There is no documented ov notes stating that pt needs exertional oxygen.  Pt will need face to face ov with walk test to show need for exertional oxygen for Medicare purposes in order to proceed with POC eval.  Dr Lake Bells, please advise if ok to proceed with ov .

## 2016-09-09 NOTE — Telephone Encounter (Signed)
Attempted to contact pt. Line rang busy x2. Will try back. 

## 2016-09-10 NOTE — Telephone Encounter (Signed)
Spoke with pt. She is scheduled to see BQ on 09/18/16. Pt would like to keep this appointment, she wants to see BQ. We will requalify her for oxygen then.

## 2016-09-10 NOTE — Telephone Encounter (Signed)
lmtcb x1 for pt. 

## 2016-09-18 ENCOUNTER — Ambulatory Visit (INDEPENDENT_AMBULATORY_CARE_PROVIDER_SITE_OTHER): Payer: Medicare Other | Admitting: Pulmonary Disease

## 2016-09-18 VITALS — BP 136/68 | HR 76 | Ht 67.0 in | Wt 137.0 lb

## 2016-09-18 DIAGNOSIS — J479 Bronchiectasis, uncomplicated: Secondary | ICD-10-CM | POA: Diagnosis not present

## 2016-09-18 DIAGNOSIS — R5383 Other fatigue: Secondary | ICD-10-CM

## 2016-09-18 DIAGNOSIS — R0602 Shortness of breath: Secondary | ICD-10-CM

## 2016-09-18 MED ORDER — BUDESONIDE-FORMOTEROL FUMARATE 160-4.5 MCG/ACT IN AERO: 2.0000 | INHALATION_SPRAY | Freq: Two times a day (BID) | RESPIRATORY_TRACT | 0 refills | Status: DC

## 2016-09-18 NOTE — Progress Notes (Signed)
Subjective:    Patient ID: [name], female    DOB: 18-Dec-1926, 83 y.o.   MRN: [id number]   Synopsis: Former patient of Dr. Gwenette Day with bronchiectasis CT chest 2012:  Bronchiectasis in RML, lingula, LLL. PFT's 06/2011: +airtrapping, no restriction, DLCO 76% pred.  Sputum 06/2011: normal flora, no AFB seen Serum Ig's 2012: normal levels.  No change in breathing or mucus with a trial of arcapta or dulera.  Sputum CS 04/2012:  pansensitive pseudomonas Uses albuterol for mucociliary clearance, flutter doesn't help  HPI Chief Complaint  Patient presents with  . Follow-up    pt requesting a POC.  pt c/o sob with exertion.     Since the last visit she has some dyspnea which has not changed much since.  She uses oxygen at night and she thinks that she needs as portable concentrator for when she leaves the home.  She has been using hypertonic saline twice a day and she feels that its not making a big difference. Albuterol and ventolin help her cough up mucus and makes her feel better.  She is not taking Symbicort.     She tells me that she has had two "black out spells" recently which was worrisome for her.  She said the last episode was 2 months ago.  She notes that this was while she was standing after breakfast.  She denied breathing difficulty at the time compared to normal.  She saw Dr. Forde Day for this.  She has had some pain in her back.    Past Medical History:  Diagnosis Date  . Anxiety   . Arthritis   . CHF (congestive heart failure) ([place])   . Complication of anesthesia    " I have to have a spinal beacuse my lungs & heart are bad "  . COPD (chronic obstructive pulmonary disease) ([place])   . Coronary artery disease   . Depression   . Fibromyalgia   . Hiatal hernia   . Hyperlipidemia   . Irregular heartbeat   . Osteoporosis   . Rotator cuff tear    RIGHT  . UTI (urinary tract infection) 09/2014      Review of Systems  Constitutional: Negative for chills, fatigue  and fever.  HENT: Negative for nosebleeds, postnasal drip and rhinorrhea.   Respiratory: Positive for cough and shortness of breath. Negative for wheezing.   Cardiovascular: Negative for chest pain, palpitations and leg swelling.       Objective:   Physical Exam Vitals:   09/18/16 1442  BP: 136/68  Pulse: 76  SpO2: 96%  Weight: 137 lb (62.1 kg)  Height: 5\' 7"  (1.702 m)  RA  Gen: chronically ill appearing HENT: OP clear, TM's clear, neck supple PULM: Crackles left base, normal percussion CV: RRR, no mgr, trace edema GI: BS+, soft, nontender Derm: no cyanosis or rash Psyche: normal mood and affect   November 2017 sputum culture pseudomonas November 2017 AFB culture negative November 2017 Fungus culture negative  CBC    Component Value Date/Time   WBC 8.4 02/12/2016 1129   RBC 3.93 02/12/2016 1129   HGB 12.2 02/12/2016 1129   HCT 38.0 02/12/2016 1129   PLT 140 (L) 02/12/2016 1129   MCV 96.7 02/12/2016 1129   MCH 31.0 02/12/2016 1129   MCHC 32.1 02/12/2016 1129   RDW 13.6 02/12/2016 1129   LYMPHSABS 0.9 02/12/2016 1129   MONOABS 0.8 02/12/2016 1129   EOSABS 0.1 02/12/2016 1129   BASOSABS 0.0 02/12/2016 1129  bc      Assessment & Plan:  Impression: Bronchiectasis Dyspnea Fatigue  Discussion: [name] has not done well in the last year. She has had 2 exacerbations of her bronchiectasis, but there is no clear underlying atypical bacterial cause like mycobacterial disease. I worry that her mucus burden is contributing to her increased symptoms. She may need a therapy vest this year. In the meantime however, I want to continue looking for evidence of mycobacterial disease by sampling her mucus again and getting a CT scan of her chest.  Because of all the fatigue she's been experiencing she needs to be screened for obstructive sleep apnea. She is at high risk for this.  For your fatigue: We will arrange for a sleep study to check for sleep apnea  For your  bronchiectasis: We will get a lung function testing high-resolution CT scan of the chest to try to figure out why it has been worsening lately We may consider having you take azithromycin on a daily basis this year Please provide [place] with a sample of your mucus Continue taking Symbicort 2 puffs twice a day Continue taking hypertonic saline twice a day Use albuterol as needed for shortness of breath  We will see you back in about 4-6 weeks to go over these results  Current Outpatient Prescriptions:  .  acetaminophen (TYLENOL) 500 MG tablet, Take 2 tablets (1,000 mg total) by mouth every 8 (eight) hours. (Patient taking differently: Take 1,000 mg by mouth every 8 (eight) hours as needed for mild pain or headache. ), Disp: 30 tablet, Rfl: 0 .  albuterol (PROVENTIL HFA;VENTOLIN HFA) 108 (90 Base) MCG/ACT inhaler, Inhale 2 puffs into the lungs every 6 (six) hours as needed., Disp: 1 Inhaler, Rfl: 5 .  budesonide-formoterol (SYMBICORT) 160-4.5 MCG/ACT inhaler, Inhale 2 puffs into the lungs 2 (two) times daily., Disp: 2 Inhaler, Rfl: 0 .  calcium carbonate (TUMS - DOSED IN MG ELEMENTAL CALCIUM) 500 MG chewable tablet, Chew 2 tablets by mouth daily as needed (acid reflux)., Disp: , Rfl:  .  Cholecalciferol (VITAMIN D3) 1000 UNITS CAPS, Take 1 capsule by mouth daily.  , Disp: , Rfl:  .  clorazepate (TRANXENE) 7.5 MG tablet, Take 3.75 mg by mouth 2 (two) times daily as needed (muscle)., Disp: , Rfl:  .  furosemide (LASIX) 20 MG tablet, TAKE ONE TABLET BY MOUTH ONCE DAILY, Disp: 30 tablet, Rfl: 2 .  HYDROcodone-homatropine (HYCODAN) 5-1.5 MG/5ML syrup, Take 5 mLs by mouth every 8 (eight) hours as needed for cough., Disp: 240 mL, Rfl: 0 .  hydrocortisone (ANUSOL-HC) 25 MG suppository, Place 25 mg rectally as needed for hemorrhoids. , Disp: , Rfl:  .  hydroxypropyl methylcellulose (ISOPTO TEARS) 2.5 % ophthalmic solution, Place 1 drop into both eyes as needed for dry eyes. , Disp: , Rfl:  .  levothyroxine  (SYNTHROID, LEVOTHROID) 75 MCG tablet, Take 75 mcg by mouth daily.  , Disp: , Rfl:  .  Magnesium 250 MG TABS, Take 250 mg by mouth daily., Disp: , Rfl:  .  magnesium hydroxide (MILK OF MAGNESIA) 400 MG/5ML suspension, Take by mouth daily as needed for mild constipation., Disp: , Rfl:  .  Multiple Vitamin (MULTIVITAMIN) capsule, Take 1 capsule by mouth daily.  , Disp: , Rfl:  .  Multiple Vitamins-Minerals (ICAPS AREDS 2) CAPS, Take 1 capsule by mouth daily., Disp: , Rfl:  .  nitroGLYCERIN (NITROSTAT) 0.4 MG SL tablet, Place 0.4 mg under the tongue every 5 (five) minutes as needed for  chest pain. , Disp: , Rfl:  .  oxybutynin (DITROPAN) 5 MG tablet, Take 5 mg by mouth daily. Reported on 02/14/2016, Disp: , Rfl:  .  OXYGEN, Inhale 2 L into the lungs at bedtime. , Disp: , Rfl:  .  Polyethyl Glycol-Propyl Glycol (SYSTANE) 0.4-0.3 % SOLN, Place 1 drop into both eyes 3 (three) times daily. , Disp: , Rfl:  .  polyethylene glycol (MIRALAX / GLYCOLAX) packet, Take 17 g by mouth daily as needed for mild constipation. , Disp: , Rfl:  .  Probiotic CAPS, Take 1 capsule by mouth daily., Disp: , Rfl:  .  sodium chloride HYPERTONIC 3 % nebulizer solution, Take by nebulization 2 (two) times daily., Disp: 360 mL, Rfl: 5

## 2016-09-18 NOTE — Patient Instructions (Signed)
For your fatigue: We will arrange for a sleep study to check for sleep apnea  For your bronchiectasis: We will get a lung function testing high-resolution CT scan of the chest to try to figure out why it has been worsening lately We may consider having you take azithromycin on a daily basis this year Please provide [place] with a sample of your mucus Continue taking Symbicort 2 puffs twice a day Continue taking hypertonic saline twice a day Use albuterol as needed for shortness of breath  We will see you back in about 4-6 weeks to go over these results

## 2016-09-21 ENCOUNTER — Encounter: Payer: Self-pay | Admitting: Pulmonary Disease

## 2016-09-21 ENCOUNTER — Ambulatory Visit (INDEPENDENT_AMBULATORY_CARE_PROVIDER_SITE_OTHER)
Admission: RE | Admit: 2016-09-21 | Discharge: 2016-09-21 | Disposition: A | Discharge status: Home / Self Care | Payer: Medicare Other | Source: Ambulatory Visit | Attending: Pulmonary Disease | Admitting: Pulmonary Disease

## 2016-09-21 DIAGNOSIS — R0602 Shortness of breath: Secondary | ICD-10-CM | POA: Diagnosis not present

## 2016-09-21 DIAGNOSIS — R918 Other nonspecific abnormal finding of lung field: Secondary | ICD-10-CM | POA: Diagnosis not present

## 2016-09-21 DIAGNOSIS — J479 Bronchiectasis, uncomplicated: Secondary | ICD-10-CM

## 2016-09-23 ENCOUNTER — Other Ambulatory Visit: Payer: Medicare Other

## 2016-09-23 DIAGNOSIS — J479 Bronchiectasis, uncomplicated: Secondary | ICD-10-CM | POA: Diagnosis not present

## 2016-09-27 LAB — RESPIRATORY CULTURE OR RESPIRATORY AND SPUTUM CULTURE

## 2016-09-28 DIAGNOSIS — R079 Chest pain, unspecified: Secondary | ICD-10-CM | POA: Diagnosis not present

## 2016-09-28 DIAGNOSIS — I872 Venous insufficiency (chronic) (peripheral): Secondary | ICD-10-CM | POA: Diagnosis not present

## 2016-09-28 DIAGNOSIS — M797 Fibromyalgia: Secondary | ICD-10-CM | POA: Diagnosis not present

## 2016-09-28 DIAGNOSIS — R008 Other abnormalities of heart beat: Secondary | ICD-10-CM | POA: Diagnosis not present

## 2016-09-28 DIAGNOSIS — R0600 Dyspnea, unspecified: Secondary | ICD-10-CM | POA: Diagnosis not present

## 2016-09-28 DIAGNOSIS — F411 Generalized anxiety disorder: Secondary | ICD-10-CM | POA: Diagnosis not present

## 2016-09-28 DIAGNOSIS — I5032 Chronic diastolic (congestive) heart failure: Secondary | ICD-10-CM | POA: Diagnosis not present

## 2016-09-28 DIAGNOSIS — J449 Chronic obstructive pulmonary disease, unspecified: Secondary | ICD-10-CM | POA: Diagnosis not present

## 2016-09-28 DIAGNOSIS — I359 Nonrheumatic aortic valve disorder, unspecified: Secondary | ICD-10-CM | POA: Diagnosis not present

## 2016-10-13 DIAGNOSIS — H353120 Nonexudative age-related macular degeneration, left eye, stage unspecified: Secondary | ICD-10-CM | POA: Diagnosis not present

## 2016-10-13 DIAGNOSIS — H35342 Macular cyst, hole, or pseudohole, left eye: Secondary | ICD-10-CM | POA: Diagnosis not present

## 2016-10-13 DIAGNOSIS — H353110 Nonexudative age-related macular degeneration, right eye, stage unspecified: Secondary | ICD-10-CM | POA: Diagnosis not present

## 2016-10-13 DIAGNOSIS — H35372 Puckering of macula, left eye: Secondary | ICD-10-CM | POA: Diagnosis not present

## 2016-10-22 LAB — FUNGUS CULTURE W SMEAR

## 2016-11-07 LAB — AFB CULTURE WITH SMEAR (NOT AT ARMC)

## 2016-11-08 ENCOUNTER — Encounter (HOSPITAL_BASED_OUTPATIENT_CLINIC_OR_DEPARTMENT_OTHER): Payer: Medicare Other

## 2016-11-12 ENCOUNTER — Telehealth: Payer: Self-pay | Admitting: Pulmonary Disease

## 2016-11-12 ENCOUNTER — Encounter (INDEPENDENT_AMBULATORY_CARE_PROVIDER_SITE_OTHER): Payer: Medicare Other | Admitting: Ophthalmology

## 2016-11-12 DIAGNOSIS — H353134 Nonexudative age-related macular degeneration, bilateral, advanced atrophic with subfoveal involvement: Secondary | ICD-10-CM | POA: Diagnosis not present

## 2016-11-12 DIAGNOSIS — H43813 Vitreous degeneration, bilateral: Secondary | ICD-10-CM | POA: Diagnosis not present

## 2016-11-12 DIAGNOSIS — H2511 Age-related nuclear cataract, right eye: Secondary | ICD-10-CM

## 2016-11-12 NOTE — Telephone Encounter (Signed)
Pt's office visit is to follow up on ct and pft, will need to be seen after pft unless she is experiencing acute symptoms.  lmtcb X1 for pt

## 2016-11-13 NOTE — Telephone Encounter (Signed)
Spoke with pt, moved appts to next available rov with both pft and ov could be scheduled on same day.  Pt aware to call office if symptoms worsen in the meantime.  Nothing further needed.

## 2016-11-19 ENCOUNTER — Ambulatory Visit: Payer: Medicare Other | Admitting: Pulmonary Disease

## 2016-12-07 ENCOUNTER — Encounter (INDEPENDENT_AMBULATORY_CARE_PROVIDER_SITE_OTHER): Payer: Medicare Other | Admitting: Ophthalmology

## 2016-12-07 DIAGNOSIS — H35373 Puckering of macula, bilateral: Secondary | ICD-10-CM

## 2016-12-07 DIAGNOSIS — H353221 Exudative age-related macular degeneration, left eye, with active choroidal neovascularization: Secondary | ICD-10-CM

## 2016-12-07 DIAGNOSIS — H353114 Nonexudative age-related macular degeneration, right eye, advanced atrophic with subfoveal involvement: Secondary | ICD-10-CM

## 2016-12-07 DIAGNOSIS — H43813 Vitreous degeneration, bilateral: Secondary | ICD-10-CM | POA: Diagnosis not present

## 2017-01-11 ENCOUNTER — Encounter (INDEPENDENT_AMBULATORY_CARE_PROVIDER_SITE_OTHER): Payer: Medicare Other | Admitting: Ophthalmology

## 2017-01-11 DIAGNOSIS — H2511 Age-related nuclear cataract, right eye: Secondary | ICD-10-CM

## 2017-01-11 DIAGNOSIS — H353114 Nonexudative age-related macular degeneration, right eye, advanced atrophic with subfoveal involvement: Secondary | ICD-10-CM

## 2017-01-11 DIAGNOSIS — H353221 Exudative age-related macular degeneration, left eye, with active choroidal neovascularization: Secondary | ICD-10-CM

## 2017-01-11 DIAGNOSIS — H35373 Puckering of macula, bilateral: Secondary | ICD-10-CM | POA: Diagnosis not present

## 2017-01-11 DIAGNOSIS — H43813 Vitreous degeneration, bilateral: Secondary | ICD-10-CM

## 2017-01-21 ENCOUNTER — Ambulatory Visit (INDEPENDENT_AMBULATORY_CARE_PROVIDER_SITE_OTHER): Payer: Medicare Other | Admitting: Pulmonary Disease

## 2017-01-21 ENCOUNTER — Telehealth: Payer: Self-pay | Admitting: Pulmonary Disease

## 2017-01-21 ENCOUNTER — Encounter: Payer: Self-pay | Admitting: Pulmonary Disease

## 2017-01-21 VITALS — BP 122/64 | HR 74 | Ht 65.0 in | Wt 110.0 lb

## 2017-01-21 DIAGNOSIS — J479 Bronchiectasis, uncomplicated: Secondary | ICD-10-CM

## 2017-01-21 DIAGNOSIS — R0602 Shortness of breath: Secondary | ICD-10-CM

## 2017-01-21 DIAGNOSIS — R5382 Chronic fatigue, unspecified: Secondary | ICD-10-CM | POA: Diagnosis not present

## 2017-01-21 LAB — PULMONARY FUNCTION TEST
DL/VA % pred: 69 %
DL/VA: 3.4 ml/min/mmHg/L
DLCO COR % PRED: 48 %
DLCO UNC: 11.94 ml/min/mmHg
DLCO cor: 12.52 ml/min/mmHg
DLCO unc % pred: 46 %
FEF 25-75 POST: 1.17 L/s
FEF 25-75 Pre: 0.88 L/sec
FEF2575-%Change-Post: 33 %
FEF2575-%Pred-Post: 110 %
FEF2575-%Pred-Pre: 82 %
FEV1-%Change-Post: 8 %
FEV1-%PRED-POST: 83 %
FEV1-%Pred-Pre: 77 %
FEV1-POST: 1.46 L
FEV1-PRE: 1.35 L
FEV1FVC-%CHANGE-POST: 11 %
FEV1FVC-%Pred-Pre: 97 %
FEV6-%Change-Post: -3 %
FEV6-%PRED-PRE: 85 %
FEV6-%Pred-Post: 82 %
FEV6-POST: 1.84 L
FEV6-PRE: 1.91 L
FEV6FVC-%PRED-POST: 106 %
FEV6FVC-%Pred-Pre: 106 %
FVC-%Change-Post: -3 %
FVC-%PRED-POST: 77 %
FVC-%PRED-PRE: 80 %
FVC-POST: 1.84 L
FVC-PRE: 1.91 L
POST FEV6/FVC RATIO: 100 %
PRE FEV1/FVC RATIO: 71 %
Post FEV1/FVC ratio: 79 %
Pre FEV6/FVC Ratio: 100 %
RV % PRED: 83 %
RV: 2.18 L
TLC % PRED: 76 %
TLC: 3.96 L

## 2017-01-21 MED ORDER — ALBUTEROL SULFATE HFA 108 (90 BASE) MCG/ACT IN AERS: 2.0000 | INHALATION_SPRAY | Freq: Four times a day (QID) | RESPIRATORY_TRACT | 5 refills | Status: DC | PRN

## 2017-01-21 MED ORDER — SODIUM CHLORIDE 3 % IN NEBU: INHALATION_SOLUTION | Freq: Two times a day (BID) | RESPIRATORY_TRACT | 5 refills | Status: DC

## 2017-01-21 NOTE — Patient Instructions (Signed)
For your bronchiectasis: Use hypertonic saline twice a day with the nebulizer machine Keep using albuterol as needed for chest tightness wheezing or shortness of breath Use the flutter valve 4-5 breath, 4-5 times a day  For your shortness of breath: We will check your oxygen level while you are walking  For your fatigue: I strongly recommend that you reconsider having a sleep study  We will see you back in 4-6 months or sooner if needed

## 2017-01-21 NOTE — Progress Notes (Signed)
Subjective:    Patient ID: [name], female    DOB: Apr 16, 1927, 83 y.o.   MRN: [id number]   Synopsis: Former patient of Dr. Gwenette Greet with bronchiectasis   HPI Chief Complaint  Patient presents with  . Follow-up    Pt was here for a PFT and is here to find out the results about that. Pt states that her only complaints are of her balance being off, hip problems, and states that she is also going blind. Pt has occ. SOB & cough with mucus. Pt states that she stays sleepy all the time and is wondering what is causing that. DME: [facility]. Pt uses 2L oxygen at bedtime. Pt states that she is wanting a shoulder bag to be able to carry her oxygen with her when going out.   She tells me that she "is going blind" because of macular degeneration.  She says that she developed the sudden onset of visual change which has progressed rapidly.    She says that she can't get too far on her feet without stopping to catch her breath. She thinks that she can only walk a few feet wihtout stopping to catch her breath.  She has been experiencing a lot of chest congestion.  It has been harder for her to get the mucus out.  She has been trying to use ventolin regularly which helps her most of the time, but this has not worked as well in the last few months.    She has been using a flutter valve but she says that it only helps to a certain degree with the chest congestion.   She says that she stays sleepy all the time.  She falls asleep easily while watching television. She canceled the appointment because she didn't want to come in to town to do the test.    Past Medical History:  Diagnosis Date  . Anxiety   . Arthritis   . CHF (congestive heart failure) ([place])   . Complication of anesthesia    " I have to have a spinal beacuse my lungs & heart are bad "  . COPD (chronic obstructive pulmonary disease) ([place])   . Coronary artery disease   . Depression   . Fibromyalgia   . Hiatal hernia   . Hyperlipidemia     . Irregular heartbeat   . Osteoporosis   . Rotator cuff tear    RIGHT  . UTI (urinary tract infection) 09/2014      Review of Systems  Constitutional: Negative for chills, fatigue and fever.  HENT: Negative for nosebleeds, postnasal drip and rhinorrhea.   Respiratory: Positive for cough and shortness of breath. Negative for wheezing.   Cardiovascular: Negative for chest pain, palpitations and leg swelling.       Objective:   Physical Exam Vitals:   01/21/17 1347  BP: 122/64  Pulse: 74  SpO2: 96%  Weight: 110 lb (49.9 kg)  Height: 5\' 5"  (1.651 m)  RA  Gen: elderly, chronically ill appearing HENT: OP clear, TM's clear, neck supple PULM: Crackles bases bilaterally B, normal percussion CV: RRR, no mgr, trace edema GI: BS+, soft, nontender Derm: no cyanosis or rash Psyche: normal mood and affect   Chest Imaging: CT chest 2012:  Bronchiectasis in RML, lingula, LLL. February 2018 high-resolution CT scan of the chest images independently reviewed showing predominantly right middle lobe and lingular bronchiectasis with some tree in bud abnormalities in the left lower lobe, trace pleural effusion on the)  PFT: PFT's 06/2011: +airtrapping, no restriction, DLCO 76% pred.  June 2018, and function testing ratio 79%, FEV1 1.46 L 83% predicted, FVC 1.84 L 77% predicted, total lung capacity 3.96 L 76% predicted, DLCO 11.94 46% predicted  Labs: Serum Ig's 2012: normal levels.    Micro: Sputum 06/2011: normal flora, no AFB seen Sputum CS 04/2012:  pansensitive pseudomonas November 2017 sputum culture pseudomonas November 2017 AFB culture negative November 2017 Fungus culture negative February 09/2016 Sputum: Pseudomonas pan sensitive February 2018 sputum AFB negative.  CBC    Component Value Date/Time   WBC 8.4 02/12/2016 1129   RBC 3.93 02/12/2016 1129   HGB 12.2 02/12/2016 1129   HCT 38.0 02/12/2016 1129   PLT 140 (L) 02/12/2016 1129   MCV 96.7 02/12/2016 1129   MCH  31.0 02/12/2016 1129   MCHC 32.1 02/12/2016 1129   RDW 13.6 02/12/2016 1129   LYMPHSABS 0.9 02/12/2016 1129   MONOABS 0.8 02/12/2016 1129   EOSABS 0.1 02/12/2016 1129   BASOSABS 0.0 02/12/2016 1129        Assessment & Plan:  Impression: Shortness of breath  Bronchiectasis without complication (HCC)  Chronic fatigue   Discussion: I worry that we are reaching a point where we can "only do so much" for [name]. In the last several visits have evaluated her for new or worsening infections, recommended that she have a sleep study, and recommended new therapies for her bronchiectasis. 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  I tried extensively on several visits to try to explain to her the rationale behind and utility of mucociliary clearance measures but despite this she has not been compliant. I've also recommended a sleep study and she has refused on several occasions.  Moving for described to be very difficult to help her if she's unwilling to participate in my recommended medical care for her.  Plan: For your bronchiectasis: Use hypertonic saline twice a day with the nebulizer machine Keep using albuterol as needed for chest tightness wheezing or shortness of breath Use the flutter valve 4-5 breath, 4-5 times a day  For your shortness of breath: We will check your oxygen level while you are walking  For your fatigue: I strongly recommend that you reconsider having a sleep study  We will see you back in 4-6 months or sooner if needed  > 50% of this 27 minute visit spent face to face   Current Outpatient Prescriptions:  .  acetaminophen (TYLENOL) 500 MG tablet, Take 2 tablets (1,000 mg total) by mouth every 8 (eight) hours. (Patient taking differently: Take 1,000 mg by mouth every  8 (eight) hours as needed for mild pain or headache. ), Disp: 30 tablet, Rfl: 0 .  albuterol (PROVENTIL HFA;VENTOLIN HFA) 108 (90 Base) MCG/ACT inhaler, Inhale 2 puffs into the lungs every 6 (six) hours as needed., Disp: 1 Inhaler, Rfl: 5 .  calcium carbonate (TUMS - DOSED IN MG ELEMENTAL CALCIUM) 500 MG chewable tablet, Chew 2 tablets by mouth daily as needed (acid reflux)., Disp: , Rfl:  .  Cholecalciferol (VITAMIN D3) 1000 UNITS CAPS, Take 1 capsule by mouth daily.  , Disp: , Rfl:  .  clorazepate (TRANXENE) 7.5 MG tablet, Take 3.75 mg by mouth 2 (two) times daily as needed (muscle)., Disp: , Rfl:  .  furosemide (LASIX) 20 MG  tablet, TAKE ONE TABLET BY MOUTH ONCE DAILY, Disp: 30 tablet, Rfl: 2 .  HYDROcodone-homatropine (HYCODAN) 5-1.5 MG/5ML syrup, Take 5 mLs by mouth every 8 (eight) hours as needed for cough., Disp: 240 mL, Rfl: 0 .  hydrocortisone (ANUSOL-HC) 25 MG suppository, Place 25 mg rectally as needed for hemorrhoids. , Disp: , Rfl:  .  levothyroxine (SYNTHROID, LEVOTHROID) 75 MCG tablet, Take 75 mcg by mouth daily.  , Disp: , Rfl:  .  Magnesium 250 MG TABS, Take 250 mg by mouth daily., Disp: , Rfl:  .  magnesium hydroxide (MILK OF MAGNESIA) 400 MG/5ML suspension, Take by mouth daily as needed for mild constipation., Disp: , Rfl:  .  Multiple Vitamin (MULTIVITAMIN) capsule, Take 1 capsule by mouth daily.  , Disp: , Rfl:  .  Multiple Vitamins-Minerals (ICAPS AREDS 2) CAPS, Take 1 capsule by mouth daily., Disp: , Rfl:  .  nitroGLYCERIN (NITROSTAT) 0.4 MG SL tablet, Place 0.4 mg under the tongue every 5 (five) minutes as needed for chest pain. , Disp: , Rfl:  .  OXYGEN, Inhale 2 L into the lungs at bedtime. , Disp: , Rfl:  .  polyethylene glycol (MIRALAX / GLYCOLAX) packet, Take 17 g by mouth daily as needed for mild constipation. , Disp: , Rfl:  .  Probiotic CAPS, Take 1 capsule by mouth daily., Disp: , Rfl:  .  sodium chloride HYPERTONIC 3 % nebulizer solution, Take by nebulization 2  (two) times daily., Disp: 360 mL, Rfl: 5 .  hydroxypropyl methylcellulose (ISOPTO TEARS) 2.5 % ophthalmic solution, Place 1 drop into both eyes as needed for dry eyes. , Disp: , Rfl:  .  oxybutynin (DITROPAN) 5 MG tablet, Take 5 mg by mouth daily. Reported on 02/14/2016, Disp: , Rfl:  .  Polyethyl Glycol-Propyl Glycol (SYSTANE) 0.4-0.3 % SOLN, Place 1 drop into both eyes 3 (three) times daily. , Disp: , Rfl:

## 2017-01-21 NOTE — Addendum Note (Signed)
Addended by: Len Blalock on: 01/21/2017 02:39 PM   Modules accepted: Orders

## 2017-01-21 NOTE — Progress Notes (Signed)
PFT done today. 

## 2017-01-21 NOTE — Telephone Encounter (Signed)
Spoke with pt and she stated she thought we BQ stated that we had samples of her rescue inhaler. Informed her that we do not carry this and I checked with [facility] and they sent refills in. She had no further questions at this time. Nothing further is needed

## 2017-02-08 ENCOUNTER — Encounter (INDEPENDENT_AMBULATORY_CARE_PROVIDER_SITE_OTHER): Payer: Medicare Other | Admitting: Ophthalmology

## 2017-02-08 DIAGNOSIS — H353114 Nonexudative age-related macular degeneration, right eye, advanced atrophic with subfoveal involvement: Secondary | ICD-10-CM

## 2017-02-08 DIAGNOSIS — H43813 Vitreous degeneration, bilateral: Secondary | ICD-10-CM | POA: Diagnosis not present

## 2017-02-08 DIAGNOSIS — H353221 Exudative age-related macular degeneration, left eye, with active choroidal neovascularization: Secondary | ICD-10-CM | POA: Diagnosis not present

## 2017-02-17 DIAGNOSIS — N3281 Overactive bladder: Secondary | ICD-10-CM | POA: Diagnosis not present

## 2017-02-17 DIAGNOSIS — R5381 Other malaise: Secondary | ICD-10-CM | POA: Diagnosis not present

## 2017-02-17 DIAGNOSIS — M545 Low back pain: Secondary | ICD-10-CM | POA: Diagnosis not present

## 2017-02-17 DIAGNOSIS — R300 Dysuria: Secondary | ICD-10-CM | POA: Diagnosis not present

## 2017-03-01 DIAGNOSIS — F418 Other specified anxiety disorders: Secondary | ICD-10-CM | POA: Diagnosis not present

## 2017-03-01 DIAGNOSIS — E538 Deficiency of other specified B group vitamins: Secondary | ICD-10-CM | POA: Diagnosis not present

## 2017-03-01 DIAGNOSIS — J479 Bronchiectasis, uncomplicated: Secondary | ICD-10-CM | POA: Diagnosis not present

## 2017-03-01 DIAGNOSIS — E038 Other specified hypothyroidism: Secondary | ICD-10-CM | POA: Diagnosis not present

## 2017-03-01 DIAGNOSIS — I5031 Acute diastolic (congestive) heart failure: Secondary | ICD-10-CM | POA: Diagnosis not present

## 2017-03-01 DIAGNOSIS — N3281 Overactive bladder: Secondary | ICD-10-CM | POA: Diagnosis not present

## 2017-03-01 DIAGNOSIS — R5381 Other malaise: Secondary | ICD-10-CM | POA: Diagnosis not present

## 2017-03-01 DIAGNOSIS — D500 Iron deficiency anemia secondary to blood loss (chronic): Secondary | ICD-10-CM | POA: Diagnosis not present

## 2017-03-01 DIAGNOSIS — Z1389 Encounter for screening for other disorder: Secondary | ICD-10-CM | POA: Diagnosis not present

## 2017-03-01 DIAGNOSIS — J449 Chronic obstructive pulmonary disease, unspecified: Secondary | ICD-10-CM | POA: Diagnosis not present

## 2017-03-01 DIAGNOSIS — M797 Fibromyalgia: Secondary | ICD-10-CM | POA: Diagnosis not present

## 2017-03-01 DIAGNOSIS — E784 Other hyperlipidemia: Secondary | ICD-10-CM | POA: Diagnosis not present

## 2017-04-12 ENCOUNTER — Encounter (INDEPENDENT_AMBULATORY_CARE_PROVIDER_SITE_OTHER): Payer: Medicare Other | Admitting: Ophthalmology

## 2017-04-12 DIAGNOSIS — H353221 Exudative age-related macular degeneration, left eye, with active choroidal neovascularization: Secondary | ICD-10-CM

## 2017-04-12 DIAGNOSIS — H353114 Nonexudative age-related macular degeneration, right eye, advanced atrophic with subfoveal involvement: Secondary | ICD-10-CM | POA: Diagnosis not present

## 2017-04-12 DIAGNOSIS — H43813 Vitreous degeneration, bilateral: Secondary | ICD-10-CM

## 2017-04-12 DIAGNOSIS — H2511 Age-related nuclear cataract, right eye: Secondary | ICD-10-CM

## 2017-04-12 DIAGNOSIS — H35373 Puckering of macula, bilateral: Secondary | ICD-10-CM | POA: Diagnosis not present

## 2017-05-19 DIAGNOSIS — Z23 Encounter for immunization: Secondary | ICD-10-CM | POA: Diagnosis not present

## 2017-05-25 ENCOUNTER — Ambulatory Visit: Payer: Medicare Other | Admitting: Pulmonary Disease

## 2017-05-28 ENCOUNTER — Ambulatory Visit (INDEPENDENT_AMBULATORY_CARE_PROVIDER_SITE_OTHER): Payer: Medicare Other | Admitting: Pulmonary Disease

## 2017-05-28 ENCOUNTER — Encounter: Payer: Self-pay | Admitting: Pulmonary Disease

## 2017-05-28 VITALS — BP 124/66 | HR 101 | Ht 67.0 in | Wt 127.2 lb

## 2017-05-28 DIAGNOSIS — J479 Bronchiectasis, uncomplicated: Secondary | ICD-10-CM

## 2017-05-28 MED ORDER — BUDESONIDE-FORMOTEROL FUMARATE 160-4.5 MCG/ACT IN AERO: 2.0000 | INHALATION_SPRAY | Freq: Two times a day (BID) | RESPIRATORY_TRACT | 0 refills | Status: DC

## 2017-05-28 NOTE — Progress Notes (Signed)
Subjective:    Patient ID: [name], female    DOB: Dec 10, 1926, 83 y.o.   MRN: [id number]   Synopsis: Former patient of Dr. Gwenette Greet with bronchiectasis   HPI Chief Complaint  Patient presents with  . Follow-up    4mo rov- pt reports of sob with exertion, prod cough with clear to grey mucus, wheezing & chest discomfort.   [name] has been struggling with macular degeneration.  She had a few injections but this really didn't help so they stopped after 3 injections.  In regards to her bronchiectasis: > she is using her ventolin and albuterol > she is still taking the hypertonic saline once per day > she says that it doesn't really help her make mucus > she thinks that she may have been on other inhalers in the past but she can't remember the name > she says that the albuterol really helps the best with mucus production  She has not been treated for a bronchitss flare or pneumonia.     Past Medical History:  Diagnosis Date  . Anxiety   . Arthritis   . CHF (congestive heart failure) ([place])   . Complication of anesthesia    " I have to have a spinal beacuse my lungs & heart are bad "  . COPD (chronic obstructive pulmonary disease) ([place])   . Coronary artery disease   . Depression   . Fibromyalgia   . Hiatal hernia   . Hyperlipidemia   . Irregular heartbeat   . Osteoporosis   . Rotator cuff tear    RIGHT  . UTI (urinary tract infection) 09/2014      Review of Systems  Constitutional: Negative for chills, fatigue and fever.  HENT: Negative for nosebleeds, postnasal drip and rhinorrhea.   Respiratory: Positive for cough and shortness of breath. Negative for wheezing.   Cardiovascular: Negative for chest pain, palpitations and leg swelling.       Objective:   Physical Exam Vitals:   05/28/17 1432  BP: 124/66  Pulse: (!) 101  SpO2: 93%  Weight: 127 lb 3.2 oz (57.7 kg)  Height: 5\' 7"  (1.702 m)  RA  Gen: elderly, chronically ill appearing HENT: OP clear,  TM's clear, neck supple PULM: few crackles bilaterally, B, normal percussion CV: RRR, no mgr, trace edema GI: BS+, soft, nontender Derm: no cyanosis or rash Psyche: normal mood and affect   Chest Imaging: CT chest 2012:  Bronchiectasis in RML, lingula, LLL. February 2018 high-resolution CT scan of the chest images independently reviewed showing predominantly right middle lobe and lingular bronchiectasis with some tree in bud abnormalities in the left lower lobe, trace pleural effusion on the)   PFT: PFT's 06/2011: +airtrapping, no restriction, DLCO 76% pred.  June 2018, and function testing ratio 79%, FEV1 1.46 L 83% predicted, FVC 1.84 L 77% predicted, total lung capacity 3.96 L 76% predicted, DLCO 11.94 46% predicted  Labs: Serum Ig's 2012: normal levels.    Micro: Sputum 06/2011: normal flora, no AFB seen Sputum CS 04/2012:  pansensitive pseudomonas November 2017 sputum culture pseudomonas November 2017 AFB culture negative November 2017 Fungus culture negative February 09/2016 Sputum: Pseudomonas pan sensitive February 2018 sputum AFB negative.  CBC    Component Value Date/Time   WBC 8.4 02/12/2016 1129   RBC 3.93 02/12/2016 1129   HGB 12.2 02/12/2016 1129   HCT 38.0 02/12/2016 1129   PLT 140 (L) 02/12/2016 1129   MCV 96.7 02/12/2016 1129   MCH 31.0 02/12/2016 1129  MCHC 32.1 02/12/2016 1129   RDW 13.6 02/12/2016 1129   LYMPHSABS 0.9 02/12/2016 1129   MONOABS 0.8 02/12/2016 1129   EOSABS 0.1 02/12/2016 1129   BASOSABS 0.0 02/12/2016 1129        Assessment & Plan:  Impression: Bronchiectasis without complication ([place])  This has been a stable interval for [name] and that she has not had an exacerbation since the last visit.  However, it is clear that her overall health is declining as she ages.  She is nearly blind at this point.  Like to try to help her with her mucociliary clearance by adding Symbicort.  I will give her samples today to see if this helps.   Otherwise I see no reason to change her medication regimen.  Her flu shot is up-to-date.  We spent 17 minutes together reviewing her medications, 27 minutes spent on this total visit.  Plan: For the yeast infection: > I recommend topical clotrimazole to help with this  For the bronchiectasis: > continue using albuterol twice a day as needed for cough, chest congestion or shortness of breath > keep using the saltwater solution as needed for chest congestion, this is intended to help you clear mucus > I'm glad your flu shot is up to date > Try taking Symbicort 2 puffs twice a day, call us if you think this sample is helpful  We will see you back in 6 months or sooner if needed   Current Outpatient Prescriptions:  .  acetaminophen (TYLENOL) 500 MG tablet, Take 2 tablets (1,000 mg total) by mouth every 8 (eight) hours. (Patient taking differently: Take 1,000 mg by mouth every 8 (eight) hours as needed for mild pain or headache. ), Disp: 30 tablet, Rfl: 0 .  albuterol (PROVENTIL HFA;VENTOLIN HFA) 108 (90 Base) MCG/ACT inhaler, Inhale 2 puffs into the lungs every 6 (six) hours as needed., Disp: 1 Inhaler, Rfl: 5 .  calcium carbonate (TUMS - DOSED IN MG ELEMENTAL CALCIUM) 500 MG chewable tablet, Chew 2 tablets by mouth daily as needed (acid reflux)., Disp: , Rfl:  .  Cholecalciferol (VITAMIN D3) 1000 UNITS CAPS, Take 1 capsule by mouth daily.  , Disp: , Rfl:  .  clorazepate (TRANXENE) 7.5 MG tablet, Take 3.75 mg by mouth 2 (two) times daily as needed (muscle)., Disp: , Rfl:  .  furosemide (LASIX) 20 MG tablet, TAKE ONE TABLET BY MOUTH ONCE DAILY, Disp: 30 tablet, Rfl: 2 .  HYDROcodone-homatropine (HYCODAN) 5-1.5 MG/5ML syrup, Take 5 mLs by mouth every 8 (eight) hours as needed for cough., Disp: 240 mL, Rfl: 0 .  hydrocortisone (ANUSOL-HC) 25 MG suppository, Place 25 mg rectally as needed for hemorrhoids. , Disp: , Rfl:  .  hydroxypropyl methylcellulose (ISOPTO TEARS) 2.5 % ophthalmic solution,  Place 1 drop into both eyes as needed for dry eyes. , Disp: , Rfl:  .  levothyroxine (SYNTHROID, LEVOTHROID) 75 MCG tablet, Take 75 mcg by mouth daily.  , Disp: , Rfl:  .  Magnesium 250 MG TABS, Take 250 mg by mouth daily., Disp: , Rfl:  .  magnesium hydroxide (MILK OF MAGNESIA) 400 MG/5ML suspension, Take by mouth daily as needed for mild constipation., Disp: , Rfl:  .  Multiple Vitamin (MULTIVITAMIN) capsule, Take 1 capsule by mouth daily.  , Disp: , Rfl:  .  Multiple Vitamins-Minerals (ICAPS AREDS 2) CAPS, Take 1 capsule by mouth daily., Disp: , Rfl:  .  nitroGLYCERIN (NITROSTAT) 0.4 MG SL tablet, Place 0.4 mg under the tongue every  5 (five) minutes as needed for chest pain. , Disp: , Rfl:  .  oxybutynin (DITROPAN) 5 MG tablet, Take 5 mg by mouth daily. Reported on 02/14/2016, Disp: , Rfl:  .  OXYGEN, Inhale 2 L into the lungs at bedtime. , Disp: , Rfl:  .  Polyethyl Glycol-Propyl Glycol (SYSTANE) 0.4-0.3 % SOLN, Place 1 drop into both eyes 3 (three) times daily. , Disp: , Rfl:  .  polyethylene glycol (MIRALAX / GLYCOLAX) packet, Take 17 g by mouth daily as needed for mild constipation. , Disp: , Rfl:  .  Probiotic CAPS, Take 1 capsule by mouth daily., Disp: , Rfl:  .  sodium chloride HYPERTONIC 3 % nebulizer solution, Take by nebulization 2 (two) times daily., Disp: 360 mL, Rfl: 5 .  budesonide-formoterol (SYMBICORT) 160-4.5 MCG/ACT inhaler, Inhale 2 puffs into the lungs 2 (two) times daily., Disp: 1 Inhaler, Rfl: 0

## 2017-05-28 NOTE — Patient Instructions (Signed)
For the yeast infection: > I recommend topical clotrimazole to help with this  For the bronchiectasis: > continue using albuterol twice a day as needed for cough, chest congestion or shortness of breath > keep using the saltwater solution as needed for chest congestion, this is intended to help you clear mucus > I'm glad your flu shot is up to date > Try taking Symbicort 2 puffs twice a day, call us if you think this sample is helpful  We will see you back in 6 months or sooner if needed

## 2017-07-05 ENCOUNTER — Encounter (INDEPENDENT_AMBULATORY_CARE_PROVIDER_SITE_OTHER): Payer: Medicare Other | Admitting: Ophthalmology

## 2017-07-05 DIAGNOSIS — H43813 Vitreous degeneration, bilateral: Secondary | ICD-10-CM

## 2017-07-05 DIAGNOSIS — H2511 Age-related nuclear cataract, right eye: Secondary | ICD-10-CM

## 2017-07-05 DIAGNOSIS — H353221 Exudative age-related macular degeneration, left eye, with active choroidal neovascularization: Secondary | ICD-10-CM | POA: Diagnosis not present

## 2017-07-05 DIAGNOSIS — H353114 Nonexudative age-related macular degeneration, right eye, advanced atrophic with subfoveal involvement: Secondary | ICD-10-CM

## 2017-08-10 DIAGNOSIS — D500 Iron deficiency anemia secondary to blood loss (chronic): Secondary | ICD-10-CM | POA: Diagnosis not present

## 2017-08-10 DIAGNOSIS — J479 Bronchiectasis, uncomplicated: Secondary | ICD-10-CM | POA: Diagnosis not present

## 2017-08-10 DIAGNOSIS — M797 Fibromyalgia: Secondary | ICD-10-CM | POA: Diagnosis not present

## 2017-08-10 DIAGNOSIS — G609 Hereditary and idiopathic neuropathy, unspecified: Secondary | ICD-10-CM | POA: Diagnosis not present

## 2017-08-10 DIAGNOSIS — E7849 Other hyperlipidemia: Secondary | ICD-10-CM | POA: Diagnosis not present

## 2017-08-10 DIAGNOSIS — I5031 Acute diastolic (congestive) heart failure: Secondary | ICD-10-CM | POA: Diagnosis not present

## 2017-08-10 DIAGNOSIS — Z6820 Body mass index (BMI) 20.0-20.9, adult: Secondary | ICD-10-CM | POA: Diagnosis not present

## 2017-08-10 DIAGNOSIS — Z1389 Encounter for screening for other disorder: Secondary | ICD-10-CM | POA: Diagnosis not present

## 2017-08-10 DIAGNOSIS — E538 Deficiency of other specified B group vitamins: Secondary | ICD-10-CM | POA: Diagnosis not present

## 2017-08-10 DIAGNOSIS — M545 Low back pain: Secondary | ICD-10-CM | POA: Diagnosis not present

## 2017-08-10 DIAGNOSIS — J449 Chronic obstructive pulmonary disease, unspecified: Secondary | ICD-10-CM | POA: Diagnosis not present

## 2017-08-10 DIAGNOSIS — I779 Disorder of arteries and arterioles, unspecified: Secondary | ICD-10-CM | POA: Diagnosis not present

## 2017-08-10 DIAGNOSIS — E038 Other specified hypothyroidism: Secondary | ICD-10-CM | POA: Diagnosis not present

## 2017-08-10 DIAGNOSIS — M810 Age-related osteoporosis without current pathological fracture: Secondary | ICD-10-CM | POA: Diagnosis not present

## 2017-08-24 DIAGNOSIS — N3946 Mixed incontinence: Secondary | ICD-10-CM | POA: Diagnosis not present

## 2017-08-24 DIAGNOSIS — R351 Nocturia: Secondary | ICD-10-CM | POA: Diagnosis not present

## 2017-08-24 DIAGNOSIS — R350 Frequency of micturition: Secondary | ICD-10-CM | POA: Diagnosis not present

## 2017-08-30 ENCOUNTER — Ambulatory Visit (INDEPENDENT_AMBULATORY_CARE_PROVIDER_SITE_OTHER): Payer: Medicare Other | Admitting: Neurology

## 2017-08-30 ENCOUNTER — Encounter: Payer: Self-pay | Admitting: Neurology

## 2017-08-30 VITALS — BP 158/80 | HR 80 | Ht 67.0 in | Wt 130.0 lb

## 2017-08-30 DIAGNOSIS — G609 Hereditary and idiopathic neuropathy, unspecified: Secondary | ICD-10-CM | POA: Diagnosis not present

## 2017-08-30 DIAGNOSIS — N3946 Mixed incontinence: Secondary | ICD-10-CM | POA: Diagnosis not present

## 2017-08-30 DIAGNOSIS — R300 Dysuria: Secondary | ICD-10-CM | POA: Diagnosis not present

## 2017-08-30 DIAGNOSIS — E538 Deficiency of other specified B group vitamins: Secondary | ICD-10-CM | POA: Diagnosis not present

## 2017-08-30 DIAGNOSIS — G629 Polyneuropathy, unspecified: Secondary | ICD-10-CM

## 2017-08-30 HISTORY — DX: Polyneuropathy, unspecified: G62.9

## 2017-08-30 MED ORDER — GABAPENTIN 100 MG PO CAPS: ORAL_CAPSULE | ORAL | 3 refills | Status: DC

## 2017-08-30 NOTE — Progress Notes (Signed)
Reason for visit: Peripheral neuropathy  Referring physician: Dr. Tye Savoy [name] is a 84 y.o. female  History of present illness:  [name] is an 84 year old right-handed white female with a 20-year history of numbness and dysesthesias in the feet associated with a peripheral neuropathy.  Over time, this has gradually worsened and has been associated with some gait instability.  The patient has fallen in the past, the last fall was about 1 year ago.  The patient is fractured her right hip previously.  The patient reports burning and stinging sensations in the feet.  The patient has numbness 1/2 up the legs below the knees.  She does report some numbness in both hands.  She has low back pain and some neck discomfort as well.  She reports some urinary incontinence that is a chronic issue for her.  She uses a cane for ambulation.  She indicates that at nighttime her leg and foot discomfort is worse.  She does have some discomfort throughout the day.  Apparently in October 2018 she was placed on low-dose gabapentin taking 100 mg at night.  The patient does not remember whether or not the dose was increased or changed in any way.  She does not recall whether she tolerated the drug well or not.  The patient has had some progressive blindness issues secondary to macular degeneration.  She comes to this office for an evaluation.  Past Medical History:  Diagnosis Date  . Anxiety   . Arthritis   . CHF (congestive heart failure) ([place])   . Complication of anesthesia    " I have to have a spinal beacuse my lungs & heart are bad "  . COPD (chronic obstructive pulmonary disease) ([place])   . Coronary artery disease   . Depression   . Fibromyalgia   . Hiatal hernia   . Hyperlipidemia   . Irregular heartbeat   . Osteoporosis   . Peripheral neuropathy 08/30/2017  . Rotator cuff tear    RIGHT  . UTI (urinary tract infection) 09/2014    Past Surgical History:  Procedure Laterality Date  .  APPENDECTOMY    . CATARACT EXTRACTION     Left  . DILATION AND CURETTAGE OF UTERUS     x2  . ELBOW SURGERY     Right  . HIP ARTHROPLASTY Right 09/22/2014   Procedure: ARTHROPLASTY BIPOLAR HIP;  Surgeon: [name], MD;  Location: [facility];  Service: Orthopedics;  Laterality: Right;  . TONSILLECTOMY    . TOTAL HIP REVISION Right 10/13/2014   Procedure: REVISION RIGHT TOTAL HIP POSTERIOR ;  Surgeon: Rod Can, MD;  Location: [facility];  Service: Orthopedics;  Laterality: Right;    Family History  Problem Relation Age of Onset  . Pancreatic cancer Father   . Heart failure Mother   . Hypertension Mother   . Heart attack Brother   . Hypertension Brother   . Stroke Brother   . Heart attack Sister   . Diabetes Son   . Obesity Son   . Heart attack Son     Social history:  reports that  has never smoked. she has never used smokeless tobacco. She reports that she does not drink alcohol or use drugs.  Medications:  Prior to Admission medications   Medication Sig Start Date End Date Taking? Authorizing Provider  acetaminophen (TYLENOL) 500 MG tablet Take 2 tablets (1,000 mg total) by mouth every 8 (eight) hours. Patient taking differently: Take 1,000  mg by mouth every 8 (eight) hours as needed for mild pain or headache.  10/15/14  Yes Swinteck, Aaron Edelman, MD  albuterol (PROVENTIL HFA;VENTOLIN HFA) 108 (90 Base) MCG/ACT inhaler Inhale 2 puffs into the lungs every 6 (six) hours as needed. 01/21/17  Yes Juanito Doom, MD  Astaxanthin 4 MG CAPS Take 1 capsule by mouth daily.   Yes [provider]  Bilberry 150 MG CAPS Take 1 capsule by mouth daily.   Yes [provider]  calcium carbonate (TUMS - DOSED IN MG ELEMENTAL CALCIUM) 500 MG chewable tablet Chew 2 tablets by mouth daily as needed (acid reflux).   Yes [provider]  Cholecalciferol (VITAMIN D3) 1000 UNITS CAPS Take 1 capsule by mouth daily.     Yes [provider]  clorazepate (TRANXENE) 7.5 MG  tablet Take 3.75 mg by mouth 2 (two) times daily as needed (muscle).   Yes [provider]  folic acid (FOLVITE) 732 MCG tablet Take 400 mcg by mouth daily.   Yes [provider]  furosemide (LASIX) 20 MG tablet TAKE ONE TABLET BY MOUTH ONCE DAILY 03/19/16  Yes Mannam, Praveen, MD  HYDROcodone-homatropine (HYCODAN) 5-1.5 MG/5ML syrup Take 5 mLs by mouth every 8 (eight) hours as needed for cough. 02/14/16  Yes Mannam, Praveen, MD  hydrocortisone (ANUSOL-HC) 25 MG suppository Place 25 mg rectally as needed for hemorrhoids.    Yes [provider]  hydroxypropyl methylcellulose (ISOPTO TEARS) 2.5 % ophthalmic solution Place 1 drop into both eyes as needed for dry eyes.    Yes [provider]  levothyroxine (SYNTHROID, LEVOTHROID) 75 MCG tablet Take 75 mcg by mouth daily.     Yes [provider]  Magnesium 250 MG TABS Take 250 mg by mouth daily.   Yes [provider]  magnesium hydroxide (MILK OF MAGNESIA) 400 MG/5ML suspension Take by mouth daily as needed for mild constipation.   Yes [provider]  Melatonin 10 MG TABS Take 1 tablet by mouth daily.   Yes [provider]  Multiple Vitamin (MULTIVITAMIN) capsule Take 1 capsule by mouth daily.     Yes [provider]  Multiple Vitamins-Minerals (ICAPS AREDS 2) CAPS Take 1 capsule by mouth daily.   Yes [provider]  nitroGLYCERIN (NITROSTAT) 0.4 MG SL tablet Place 0.4 mg under the tongue every 5 (five) minutes as needed for chest pain.    Yes [provider]  oxybutynin (DITROPAN) 5 MG tablet Take 5 mg by mouth daily. Reported on 02/14/2016   Yes [provider]  OXYGEN Inhale 2 L into the lungs at bedtime.    Yes [provider]  Polyethyl Glycol-Propyl Glycol (SYSTANE) 0.4-0.3 % SOLN Place 1 drop into both eyes 3 (three) times daily.    Yes [provider]  polyethylene glycol (MIRALAX / GLYCOLAX) packet Take 17 g by mouth daily  as needed for mild constipation.    Yes [provider]  Probiotic CAPS Take 1 capsule by mouth daily.   Yes [provider]  sodium chloride HYPERTONIC 3 % nebulizer solution Take by nebulization 2 (two) times daily. 01/21/17  Yes Juanito Doom, MD  Valerian 500 MG CAPS Take 500 mg by mouth daily.   Yes [provider]  vitamin B-12 (CYANOCOBALAMIN) 1000 MCG tablet Take 1,000 mcg by mouth daily.   Yes [provider]  vitamin C (ASCORBIC ACID) 500 MG tablet Take 500 mg by mouth daily.   Yes [provider]  budesonide-formoterol (SYMBICORT) 160-4.5 MCG/ACT inhaler Inhale 2 puffs into the lungs 2 (two) times daily. 05/28/17 05/29/17  Juanito Doom, MD  gabapentin (NEURONTIN) 100 MG capsule One capsule in the morning and 2 in the evening 08/30/17   Kathrynn Ducking, MD      Allergies  Allergen Reactions  . Shrimp [Shellfish Allergy]   . Fish Allergy Nausea And Vomiting    Extremely sick  . Other     Does not remember which "mycin" drug.     ROS:  Out of a complete 14 system review of symptoms, the patient complains only of the following symptoms, and all other reviewed systems are negative.  Fatigue Chest pain, palpitations of the heart, heart murmur, swelling in the legs Hearing loss, difficulty swallowing Itching Loss of vision, eye pain Shortness of breath, cough, wheezing, snoring Incontinence of the bladder Easy bruising, feeling cold Muscle cramps, aching muscles Allergies, runny nose Headache, numbness, weakness, difficulty swallowing, dizziness, passing out Not enough sleep, racing thoughts Insomnia, sleepiness, restless legs  Blood pressure (!) 158/80, pulse 80, height 5\' 7"  (1.702 m), weight 130 lb (59 kg), SpO2 94 %.  Physical Exam  General: The patient is alert and cooperative at the time of the examination.  Eyes: Pupils are equal, round, and reactive to light. Discs are flat bilaterally.  Neck: The neck  is supple, no carotid bruits are noted.  Respiratory: The respiratory examination is clear.  Cardiovascular: The cardiovascular examination reveals a regular rate and rhythm, no obvious murmurs or rubs are noted.  Skin: Extremities are with 1+ edema at the ankles bilaterally.  Neurologic Exam  Mental status: The patient is alert and oriented x 3 at the time of the examination. The patient has apparent normal recent and remote memory, with an apparently normal attention span and concentration ability.  Cranial nerves: Facial symmetry is present. There is good sensation of the face to pinprick and soft touch bilaterally. The strength of the facial muscles and the muscles to head turning and shoulder shrug are normal bilaterally. Speech is well enunciated, no aphasia or dysarthria is noted. Extraocular movements are full. Visual fields are restricted, the patient has difficulty even counting fingers.. The tongue is midline, and the patient has symmetric elevation of the soft palate. No obvious hearing deficits are noted.  Motor: The motor testing reveals 5 over 5 strength of all 4 extremities. Good symmetric motor tone is noted throughout.  Sensory: Sensory testing is intact to pinprick, soft touch, vibration sensation, and position sense on the upper extremities.  With the lower extremities the patient has a stocking pattern sensory deficit 1 [place] up the legs below the knees bilaterally.  Vibration sensation is decreased in both feet, the patient has fair position sense in both feet.  No evidence of extinction is noted.  Coordination: Cerebellar testing reveals good finger-nose-finger and heel-to-shin bilaterally.  Gait and station: Gait is slightly wide-based, the patient uses a cane for ambulation.  Tandem gait was not attempted. Romberg is negative. No drift is seen.  Reflexes: Deep tendon reflexes are symmetric, but are depressed bilaterally. Toes are downgoing  bilaterally.   Assessment/Plan:  1.  Peripheral neuropathy  2.  Mild gait instability  3.  Decreased visual acuity  The patient gives a 20-year history of sensory dysesthesias in the feet with worsening symptoms over time.  It appears that she was placed on low-dose gabapentin 100 mg at night in October, it is not clear whether the dose was  increased or not.  The patient will be retried on gabapentin starting at 100 mg in the morning and 200 mg in the evening and we will go up on the dose depending on her tolerance and effectiveness of the drug.  The patient will have blood work done today, she will follow-up in about 4 months.  Jill Alexanders MD 08/30/2017 2:35 PM  [facility] [address] [place], [place] [address]  Phone [phone] Fax [phone]

## 2017-08-30 NOTE — Patient Instructions (Signed)
  We will restart gabapentin at 100 mg in the morning and 2 in the evening.  Neurontin (gabapentin) may result in drowsiness, ankle swelling, gait instability, or possibly dizziness. Please contact our office if significant side effects occur with this medication.

## 2017-09-02 LAB — MULTIPLE MYELOMA PANEL, SERUM
ALBUMIN SERPL ELPH-MCNC: 3.4 g/dL (ref 2.9–4.4)
Albumin/Glob SerPl: 0.9 (ref 0.7–1.7)
Alpha 1: 0.3 g/dL (ref 0.0–0.4)
Alpha2 Glob SerPl Elph-Mcnc: 0.7 g/dL (ref 0.4–1.0)
B-GLOBULIN SERPL ELPH-MCNC: 0.9 g/dL (ref 0.7–1.3)
GLOBULIN, TOTAL: 4 g/dL — AB (ref 2.2–3.9)
Gamma Glob SerPl Elph-Mcnc: 2 g/dL — ABNORMAL HIGH (ref 0.4–1.8)
IgA/Immunoglobulin A, Serum: 360 mg/dL (ref 64–422)
IgG (Immunoglobin G), Serum: 1883 mg/dL — ABNORMAL HIGH (ref 700–1600)
IgM (Immunoglobulin M), Srm: 99 mg/dL (ref 26–217)
TOTAL PROTEIN: 7.4 g/dL (ref 6.0–8.5)

## 2017-09-02 LAB — VITAMIN B12: Vitamin B-12: 1500 pg/mL — ABNORMAL HIGH (ref 232–1245)

## 2017-09-02 LAB — ANA W/REFLEX: Anti Nuclear Antibody(ANA): NEGATIVE

## 2017-09-02 LAB — B. BURGDORFI ANTIBODIES

## 2017-09-03 ENCOUNTER — Telehealth: Payer: Self-pay | Admitting: *Deleted

## 2017-09-03 NOTE — Telephone Encounter (Signed)
Tried calling pt, got busy signal. Will try again later.  If pt calls back, ok to inform labs unremarkable per CW,MD note.

## 2017-09-03 NOTE — Telephone Encounter (Signed)
-----   Message from Kathrynn Ducking, MD sent at 09/02/2017  4:41 PM EST -----  The blood work results are unremarkable. Please call the patient.  ----- Message ----- From: Lavone Neri Lab Results In Sent: 08/31/2017   7:42 AM To: Kathrynn Ducking, MD

## 2017-09-07 ENCOUNTER — Encounter: Payer: Self-pay | Admitting: *Deleted

## 2017-09-07 NOTE — Telephone Encounter (Signed)
Tried patient again. Got busy signal. Will send letter with lab results to pt since unable to reach by phone.

## 2017-09-09 DIAGNOSIS — Z6820 Body mass index (BMI) 20.0-20.9, adult: Secondary | ICD-10-CM | POA: Diagnosis not present

## 2017-09-09 DIAGNOSIS — M810 Age-related osteoporosis without current pathological fracture: Secondary | ICD-10-CM | POA: Diagnosis not present

## 2017-09-12 ENCOUNTER — Other Ambulatory Visit: Payer: Self-pay | Admitting: Family Medicine

## 2017-09-12 DIAGNOSIS — M545 Low back pain: Secondary | ICD-10-CM

## 2017-09-12 DIAGNOSIS — Q782 Osteopetrosis: Secondary | ICD-10-CM

## 2017-09-14 ENCOUNTER — Ambulatory Visit
Admission: RE | Admit: 2017-09-14 | Discharge: 2017-09-14 | Disposition: A | Discharge status: Home / Self Care | Payer: Medicare Other | Source: Ambulatory Visit | Attending: Family Medicine | Admitting: Family Medicine

## 2017-09-14 DIAGNOSIS — Q782 Osteopetrosis: Secondary | ICD-10-CM

## 2017-09-14 DIAGNOSIS — M545 Low back pain: Secondary | ICD-10-CM

## 2017-09-14 DIAGNOSIS — M48061 Spinal stenosis, lumbar region without neurogenic claudication: Secondary | ICD-10-CM | POA: Diagnosis not present

## 2017-09-20 ENCOUNTER — Telehealth: Payer: Self-pay | Admitting: Pulmonary Disease

## 2017-09-20 ENCOUNTER — Telehealth: Payer: Self-pay | Admitting: Neurology

## 2017-09-20 NOTE — Telephone Encounter (Signed)
I called the patient.  The patient has had 3-4 weeks of low back pain, she has recently had MRI of the lumbar spine that did not show any acute changes, no evidence of severe spinal stenosis or nerve root impingement.  She reports numbness going down the right leg, loss of use of the right leg.  Within the last week or so she has also developed weakness of both arms, she reports some left-sided neck pain.  We will need to get her into the office for another evaluation, she may require MRI of the cervical spine.

## 2017-09-20 NOTE — Telephone Encounter (Signed)
Pt called back wanting to know when she would receive a return call. I told her the RN sent her message to Dr Jannifer Franklin and is now waiting on his instructions. Pt understood.

## 2017-09-20 NOTE — Telephone Encounter (Signed)
Pt called stating she is having intense back pain and is loosing feeling throughout her right leg also having a hard time with using her arms. Pt is having to have her husband help her out of bed and says she can barely cough without it hurting so bad. Please call back to advise

## 2017-09-20 NOTE — Telephone Encounter (Signed)
Called patient. Scheduled work in appt for 09/22/17 at 1:30pm, check in 1:00pm. She verbalized understanding and appreciation for call.

## 2017-09-20 NOTE — Telephone Encounter (Signed)
Called and spoke with patient we have scheduled her with MW on 2.19 advised patient that if she gets worse to go to the ER.

## 2017-09-20 NOTE — Telephone Encounter (Signed)
Spoke with patient. She stated that she has been coughing for the past 3 days. Productive cough with dark phlegm. She also has been having some general body aches and some increased fatigue. Increased SOB.   She is requesting an abx.   She wishes to use [facility] in [place].   BQ, please advise. Thanks!

## 2017-09-20 NOTE — Telephone Encounter (Signed)
I'm worried she may have the flu Needs OV

## 2017-09-21 ENCOUNTER — Encounter: Payer: Self-pay | Admitting: Internal Medicine

## 2017-09-21 ENCOUNTER — Ambulatory Visit (INDEPENDENT_AMBULATORY_CARE_PROVIDER_SITE_OTHER): Payer: Medicare Other | Admitting: Internal Medicine

## 2017-09-21 ENCOUNTER — Ambulatory Visit (INDEPENDENT_AMBULATORY_CARE_PROVIDER_SITE_OTHER)
Admission: RE | Admit: 2017-09-21 | Discharge: 2017-09-21 | Disposition: A | Discharge status: Home / Self Care | Payer: Medicare Other | Source: Ambulatory Visit | Attending: Internal Medicine | Admitting: Internal Medicine

## 2017-09-21 VITALS — BP 106/60 | HR 55 | Temp 98.4°F | Ht 67.0 in | Wt 130.0 lb

## 2017-09-21 DIAGNOSIS — J479 Bronchiectasis, uncomplicated: Secondary | ICD-10-CM

## 2017-09-21 DIAGNOSIS — R05 Cough: Secondary | ICD-10-CM | POA: Diagnosis not present

## 2017-09-21 MED ORDER — CIPROFLOXACIN HCL 750 MG PO TABS: 750.0000 mg | ORAL_TABLET | Freq: Two times a day (BID) | ORAL | 0 refills | Status: DC

## 2017-09-21 MED ORDER — BUDESONIDE-FORMOTEROL FUMARATE 80-4.5 MCG/ACT IN AERO: 2.0000 | INHALATION_SPRAY | Freq: Two times a day (BID) | RESPIRATORY_TRACT | 11 refills | Status: DC

## 2017-09-21 NOTE — Patient Instructions (Addendum)
cipro 750 mg twice daily x 10 days   For cough > mucinex dm 1200 mg every 12 hours with flutter valve as much as possible   Work on inhaler technique:  relax and gently blow all the way out then take a nice smooth deep breath back in, triggering the inhaler at same time you start breathing in.  Hold for up to 5 seconds if you can. Blow out thru nose. Rinse and gargle with water when done   Please remember to go to the  x-ray department downstairs in the basement  for your tests - we will call you with the results when they are available.

## 2017-09-21 NOTE — Progress Notes (Signed)
Subjective:    Patient ID: [name], female    DOB: Aug 31, 1926, 84 y.o.   MRN: [id number]   Synopsis: Former patient of Dr. Gwenette Day with bronchiectasis   HPI     [name] of 05/28/17: In regards to her bronchiectasis: > she is using her ventolin and albuterol > she is still taking the hypertonic saline once per day > she says that it doesn't really help her make mucus > she thinks that she may have been on other inhalers in the past but she can't remember the name > she says that the albuterol really helps the best with mucus production  She has not been treated for a bronchitss flare or pneumonia. rec For the yeast infection: > I recommend topical clotrimazole to help with this For the bronchiectasis: > continue using albuterol twice a day as needed for cough, chest congestion or shortness of breath > keep using the saltwater solution as needed for chest congestion, this is intended to help you clear mucus > I'm glad your flu shot is up to date > Try taking Symbicort 2 puffs twice a day, call us if you think this sample is helpful    09/21/2017 acute  ov/[name] re: bronchiectasis/ flare of cough / not usre symb is helping though hfa very poor technique Chief Complaint  Patient presents with  . Acute Visit    Pt c/o increased cough over the past 2 days. She is producing some yellow sputum.  She states her breathing is unchanged since her last visit. She is using her albuterol inhaler once daily on average.  Dyspnea:  Ok at rest / more limited by legs than sob walking  Cough: worse x 2 days yellow thin never bloody/ some worse in ams Sleep: on hosp bed 30 degrees/ 2lpm     No obvious day to day or daytime variability or assoc excess/ purulent sputum or mucus plugs or hemoptysis or cp or chest tightness, subjective wheeze or overt sinus or hb symptoms. No unusual exposure hx or h/o childhood pna/ asthma or knowledge of premature birth.  Sleeping ok  30 degrees on 2lpm   without nocturnal    exacerbation  of respiratory  c/o's or need for noct saba. Also denies any obvious fluctuation of symptoms with weather or environmental changes or other aggravating or alleviating factors except as outlined above   Current Allergies, Complete Past Medical History, Past Surgical History, Family History, and Social History were reviewed in [employer] [facility] record.  ROS  The following are not active complaints unless bolded Hoarseness, sore throat, dysphagia, dental problems, itching, sneezing,  nasal congestion or discharge of excess mucus or purulent secretions, ear ache,   fever, chills, sweats, unintended wt loss or wt gain, classically pleuritic or exertional cp,  orthopnea pnd or leg swelling, presyncope, palpitations, abdominal pain, anorexia, nausea, vomiting, diarrhea  or change in bowel habits or change in bladder habits, change in stools or change in urine, dysuria, hematuria,  rash, arthralgias, visual complaints, headache, numbness, weakness or ataxia or problems with walking or coordination,  change in mood/affect or memory.        Current Meds  Medication Sig  . acetaminophen (TYLENOL) 500 MG tablet Take 2 tablets (1,000 mg total) by mouth every 8 (eight) hours. (Patient taking differently: Take 1,000 mg by mouth every 8 (eight) hours as needed for mild pain or headache. )  . albuterol (PROVENTIL HFA;VENTOLIN HFA) 108 (90 Base) MCG/ACT inhaler Inhale 2 puffs  into the lungs every 6 (six) hours as needed.  . Astaxanthin 4 MG CAPS Take 1 capsule by mouth daily.  . Bilberry 150 MG CAPS Take 1 capsule by mouth daily.  . calcium carbonate (TUMS - DOSED IN MG ELEMENTAL CALCIUM) 500 MG chewable tablet Chew 2 tablets by mouth daily as needed (acid reflux).  . Cholecalciferol (VITAMIN D3) 1000 UNITS CAPS Take 1 capsule by mouth daily.    . clorazepate (TRANXENE) 7.5 MG tablet Take 3.75 mg by mouth 2 (two) times daily as needed (muscle).  . folic acid  (FOLVITE) 270 MCG tablet Take 400 mcg by mouth daily.  . furosemide (LASIX) 20 MG tablet TAKE ONE TABLET BY MOUTH ONCE DAILY  . gabapentin (NEURONTIN) 100 MG capsule One capsule in the morning and 2 in the evening  . HYDROcodone-homatropine (HYCODAN) 5-1.5 MG/5ML syrup Take 5 mLs by mouth every 8 (eight) hours as needed for cough.  . hydrocortisone (ANUSOL-HC) 25 MG suppository Place 25 mg rectally as needed for hemorrhoids.   . hydroxypropyl methylcellulose (ISOPTO TEARS) 2.5 % ophthalmic solution Place 1 drop into both eyes as needed for dry eyes.   Marland Kitchen levothyroxine (SYNTHROID, LEVOTHROID) 75 MCG tablet Take 75 mcg by mouth daily.    . Magnesium 250 MG TABS Take 250 mg by mouth daily.  . magnesium hydroxide (MILK OF MAGNESIA) 400 MG/5ML suspension Take by mouth daily as needed for mild constipation.  . Melatonin 10 MG TABS Take 1 tablet by mouth daily.  . meloxicam (MOBIC) 7.5 MG tablet Take 7.5 mg by mouth daily.  . Multiple Vitamin (MULTIVITAMIN) capsule Take 1 capsule by mouth daily.    . Multiple Vitamins-Minerals (ICAPS AREDS 2) CAPS Take 1 capsule by mouth daily.  . nitroGLYCERIN (NITROSTAT) 0.4 MG SL tablet Place 0.4 mg under the tongue every 5 (five) minutes as needed for chest pain.   Marland Kitchen oxybutynin (DITROPAN) 5 MG tablet Take 5 mg by mouth daily. Reported on 02/14/2016  . OXYGEN Inhale 2 L into the lungs at bedtime.   Vladimir Faster Glycol-Propyl Glycol (SYSTANE) 0.4-0.3 % SOLN Place 1 drop into both eyes 3 (three) times daily.   . polyethylene glycol (MIRALAX / GLYCOLAX) packet Take 17 g by mouth daily as needed for mild constipation.   . Probiotic CAPS Take 1 capsule by mouth daily.  . sodium chloride HYPERTONIC 3 % nebulizer solution Take by nebulization 2 (two) times daily.  . Valerian 500 MG CAPS Take 500 mg by mouth daily.  . vitamin B-12 (CYANOCOBALAMIN) 1000 MCG tablet Take 1,000 mcg by mouth daily.  . vitamin C (ASCORBIC ACID) 500 MG tablet Take 500 mg by mouth daily.               Objective:   Physical Exam   Elderly frail wf needs one person assist to stand and 2 to get up on exam table but not assoc sob   Wt Readings from Last 3 Encounters:  09/21/17 130 lb (59 kg)  08/30/17 130 lb (59 kg)  05/28/17 127 lb 3.2 oz (57.7 kg)     Vital signs reviewed - Note on arrival 02 sats  93% on RA       HEENT: nl dentition, turbinates bilaterally, and oropharynx. Nl external ear canals without cough reflex   NECK :  without JVD/Nodes/TM/ nl carotid upstrokes bilaterally   LUNGS: no acc muscle use, bilateral coarse insp and exp rhonchi   CV:  RRR  no s3 or murmur or increase in  P2, and no edema   ABD:  soft and nontender with nl inspiratory excursion in the supine position. No bruits or organomegaly appreciated, bowel sounds nl  MS:  Very slow gait/ ext warm without deformities, calf tenderness, cyanosis or clubbing djd changes both hands   SKIN: warm and dry without lesions    NEURO:  alert, approp, nl sensorium with  no motor or cerebellar deficits apparent.       STUDIES REVIEWED 09/21/2017  Chest Imaging: CT chest 2012:  Bronchiectasis in RML, lingula, LLL. February 2018 high-resolution CT scan of the chest predominantly right middle lobe and lingular bronchiectasis with some tree in bud abnormalities in the left lower lobe, trace pleural effusion on the)   PFT: PFT's 06/2011: +airtrapping, no restriction, DLCO 76% pred.  June 2018, and function testing ratio 79%, FEV1 1.46 L 83% predicted, FVC 1.84 L 77% predicted, total lung capacity 3.96 L 76% predicted, DLCO 11.94 46% predicted  Labs: Serum Ig's 2012: normal levels.    Micro: Sputum 06/2011: normal flora, no AFB seen Sputum CS 04/2012:  pansensitive pseudomonas November 2017 sputum culture pseudomonas November 2017 AFB culture negative November 2017 Fungus culture negative February 09/2016 Sputum: Pseudomonas pan sensitive February 2018 sputum AFB negative.    Assessment &  Plan:

## 2017-09-22 ENCOUNTER — Encounter: Payer: Self-pay | Admitting: Internal Medicine

## 2017-09-22 ENCOUNTER — Encounter: Payer: Self-pay | Admitting: Neurology

## 2017-09-22 ENCOUNTER — Ambulatory Visit (INDEPENDENT_AMBULATORY_CARE_PROVIDER_SITE_OTHER): Payer: Medicare Other | Admitting: Neurology

## 2017-09-22 VITALS — BP 156/79 | HR 91 | Ht 67.0 in | Wt 129.0 lb

## 2017-09-22 DIAGNOSIS — G8929 Other chronic pain: Secondary | ICD-10-CM

## 2017-09-22 DIAGNOSIS — G609 Hereditary and idiopathic neuropathy, unspecified: Secondary | ICD-10-CM

## 2017-09-22 DIAGNOSIS — M5441 Lumbago with sciatica, right side: Secondary | ICD-10-CM

## 2017-09-22 MED ORDER — GABAPENTIN 100 MG PO CAPS: ORAL_CAPSULE | ORAL | 3 refills | Status: DC

## 2017-09-22 NOTE — Progress Notes (Signed)
Reason for visit: Back pain, right leg pain  [name] is an 84 y.o. female  History of present illness:  Ms. [name] is an 84 year old right-handed white female with a history of fibromyalgia and chronic low back pain.  The patient comes in with an increase in pain over the last couple weeks that affects the right lower back, right hip and down the right leg.  The patient has hypersensitivity to touch below the knees bilaterally in the right side.  The patient has undergone MRI of the lumbar spine done in February 2019, this did not show any surgically amenable issues, the patient has not had any definite nerve root impingement in the back.  The patient has been placed on gabapentin taking 100 mg in the morning and 200 mg in the evening, she is tolerating the dose well.  She reports that she has some left-sided neck pain, there may be some slight weakness in the arms as well, but she claims that she has bilateral rotator cuff tear problems.  She has urinary frequency, she has to get up frequently at night but otherwise she sleeps well.  She recently has developed a cough and she is now on Cipro, she takes meloxicam which has helped some.  When she gets up and starts to walk, the pain improves some.  Past Medical History:  Diagnosis Date  . Anxiety   . Arthritis   . CHF (congestive heart failure) ([place])   . Complication of anesthesia    " I have to have a spinal beacuse my lungs & heart are bad "  . COPD (chronic obstructive pulmonary disease) ([place])   . Coronary artery disease   . Depression   . Fibromyalgia   . Hiatal hernia   . Hyperlipidemia   . Irregular heartbeat   . Osteoporosis   . Peripheral neuropathy 08/30/2017  . Rotator cuff tear    RIGHT  . UTI (urinary tract infection) 09/2014    Past Surgical History:  Procedure Laterality Date  . APPENDECTOMY    . CATARACT EXTRACTION     Left  . DILATION AND CURETTAGE OF UTERUS     x2  . ELBOW SURGERY     Right  . HIP  ARTHROPLASTY Right 09/22/2014   Procedure: ARTHROPLASTY BIPOLAR HIP;  Surgeon: [name], MD;  Location: [facility];  Service: Orthopedics;  Laterality: Right;  . TONSILLECTOMY    . TOTAL HIP REVISION Right 10/13/2014   Procedure: REVISION RIGHT TOTAL HIP POSTERIOR ;  Surgeon: Rod Can, MD;  Location: [facility];  Service: Orthopedics;  Laterality: Right;    Family History  Problem Relation Age of Onset  . Pancreatic cancer Father   . Heart failure Mother   . Hypertension Mother   . Heart attack Brother   . Hypertension Brother   . Stroke Brother   . Heart attack Sister   . Diabetes Son   . Obesity Son   . Heart attack Son     Social history:  reports that  has never smoked. she has never used smokeless tobacco. She reports that she does not drink alcohol or use drugs.    Allergies  Allergen Reactions  . Shrimp [Shellfish Allergy]   . Fish Allergy Nausea And Vomiting    Extremely sick  . Other     Does not remember which "mycin" drug.     Medications:  Prior to Admission medications   Medication Sig Start Date End Date Taking?  Authorizing Provider  acetaminophen (TYLENOL) 500 MG tablet Take 2 tablets (1,000 mg total) by mouth every 8 (eight) hours. Patient taking differently: Take 1,000 mg by mouth every 8 (eight) hours as needed for mild pain or headache.  10/15/14  Yes Swinteck, Aaron Edelman, MD  albuterol (PROVENTIL HFA;VENTOLIN HFA) 108 (90 Base) MCG/ACT inhaler Inhale 2 puffs into the lungs every 6 (six) hours as needed. 01/21/17  Yes Juanito Doom, MD  Astaxanthin 4 MG CAPS Take 1 capsule by mouth daily.   Yes [provider]  Bilberry 150 MG CAPS Take 1 capsule by mouth daily.   Yes [provider]  budesonide-formoterol (SYMBICORT) 80-4.5 MCG/ACT inhaler Inhale 2 puffs into the lungs 2 (two) times daily. 09/21/17  Yes Tanda Rockers, MD  calcium carbonate (TUMS - DOSED IN MG ELEMENTAL CALCIUM) 500 MG chewable tablet Chew 2 tablets by mouth daily as  needed (acid reflux).   Yes [provider]  cephALEXin (KEFLEX) 500 MG capsule Take 500 mg by mouth. 08/30/17  Yes [provider]  Cholecalciferol (VITAMIN D3) 1000 UNITS CAPS Take 1 capsule by mouth daily.     Yes [provider]  ciprofloxacin (CIPRO) 750 MG tablet Take 1 tablet (750 mg total) by mouth 2 (two) times daily. 09/21/17  Yes Tanda Rockers, MD  clorazepate (TRANXENE) 7.5 MG tablet Take 3.75 mg by mouth 2 (two) times daily as needed (muscle).   Yes [provider]  folic acid (FOLVITE) 016 MCG tablet Take 400 mcg by mouth daily.   Yes [provider]  furosemide (LASIX) 20 MG tablet TAKE ONE TABLET BY MOUTH ONCE DAILY 03/19/16  Yes Mannam, Praveen, MD  gabapentin (NEURONTIN) 100 MG capsule One capsule twice a day and 3 at night 09/22/17  Yes Kathrynn Ducking, MD  HYDROcodone-homatropine [facility]) 5-1.5 MG/5ML syrup Take 5 mLs by mouth every 8 (eight) hours as needed for cough. 02/14/16  Yes Mannam, Praveen, MD  hydrocortisone (ANUSOL-HC) 25 MG suppository Place 25 mg rectally as needed for hemorrhoids.    Yes [provider]  hydroxypropyl methylcellulose (ISOPTO TEARS) 2.5 % ophthalmic solution Place 1 drop into both eyes as needed for dry eyes.    Yes [provider]  levothyroxine (SYNTHROID, LEVOTHROID) 75 MCG tablet Take 75 mcg by mouth daily.     Yes [provider]  Magnesium 250 MG TABS Take 250 mg by mouth daily.   Yes [provider]  magnesium hydroxide (MILK OF MAGNESIA) 400 MG/5ML suspension Take by mouth daily as needed for mild constipation.   Yes [provider]  Melatonin 10 MG TABS Take 1 tablet by mouth daily.   Yes [provider]  meloxicam (MOBIC) 7.5 MG tablet Take 7.5 mg by mouth daily.   Yes [provider]  Multiple Vitamin (MULTIVITAMIN) capsule Take 1 capsule by mouth daily.     Yes [provider]  Multiple Vitamins-Minerals (ICAPS AREDS 2)  CAPS Take 1 capsule by mouth daily.   Yes [provider]  nitroGLYCERIN (NITROSTAT) 0.4 MG SL tablet Place 0.4 mg under the tongue every 5 (five) minutes as needed for chest pain.    Yes [provider]  oxybutynin (DITROPAN) 5 MG tablet Take 5 mg by mouth daily. Reported on 02/14/2016   Yes [provider]  OXYGEN Inhale 2 L into the lungs at bedtime.    Yes [provider]  Polyethyl Glycol-Propyl Glycol (SYSTANE) 0.4-0.3 % SOLN Place 1 drop into both eyes  3 (three) times daily.    Yes [provider]  polyethylene glycol (MIRALAX / GLYCOLAX) packet Take 17 g by mouth daily as needed for mild constipation.    Yes [provider]  Probiotic CAPS Take 1 capsule by mouth daily.   Yes [provider]  sodium chloride HYPERTONIC 3 % nebulizer solution Take by nebulization 2 (two) times daily. 01/21/17  Yes Juanito Doom, MD  Valerian 500 MG CAPS Take 500 mg by mouth daily.   Yes [provider]  vitamin B-12 (CYANOCOBALAMIN) 1000 MCG tablet Take 1,000 mcg by mouth daily.   Yes [provider]  vitamin C (ASCORBIC ACID) 500 MG tablet Take 500 mg by mouth daily.   Yes [provider]  budesonide-formoterol (SYMBICORT) 160-4.5 MCG/ACT inhaler Inhale 2 puffs into the lungs 2 (two) times daily. 05/28/17 05/29/17  Juanito Doom, MD    ROS:  Out of a complete 14 system review of symptoms, the patient complains only of the following symptoms, and all other reviewed systems are negative.  Chest pain, leg swelling, palpitations of the heart, heart murmur Incontinence of the bladder, urinary urgency Sleep apnea, daytime sleepiness, snoring Back pain, aching muscles, muscle cramps, walking difficulty, neck pain, neck stiffness Bruising easily  Blood pressure (!) 156/79, pulse 91, height 5\' 7"  (1.702 m), weight 129 lb (58.5 kg).  Physical Exam  General: The patient is alert and cooperative at the time of  the examination.  Neuromuscular: Internal and external rotation of the right hip does not elicit pain in the hip.  Skin: No significant peripheral edema is noted.   Neurologic Exam  Mental status: The patient is alert and oriented x 3 at the time of the examination. The patient has apparent normal recent and remote memory, with an apparently normal attention span and concentration ability.   Cranial nerves: Facial symmetry is present. Speech is normal, no aphasia or dysarthria is noted. Extraocular movements are full. Visual fields are full.  Motor: The patient has good strength in all 4 extremities, the patient does have some giveaway weakness with hip flexion on the right, she claims that this increases pain.  Sensory examination: Soft touch sensation is symmetric on the face, arms, and legs.  Coordination: The patient has good finger-nose-finger and heel-to-shin bilaterally.  Gait and station: The patient has the ability to ambulate independently.  She normally uses a cane for ambulation.  Posture is somewhat stooped.  Tandem gait was not attempted.  Romberg is negative.  Reflexes: Deep tendon reflexes are symmetric.   MRI lumbar 09/23/17:  IMPRESSION: 1. Near complete healing of a superior endplate compression fracture at L2 with 20% loss of height. 2. 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 3. Mild foraminal narrowing at L1-2 is worse on the right. 4. Mild left subarticular and bilateral foraminal narrowing at L2-3. 5. Mild subarticular and foraminal narrowing bilaterally at L3-4 and L4-5.  * MRI scan images were reviewed online. I agree with the written report.    Assessment/Plan:  1.  Chronic low back pain, right leg pain  2.  Fibromyalgia  The patient will be increased on the gabapentin taking 100 mg twice during the day and 300 mg at night.  She will be sent for an epidural steroid injection of the low back.  She will follow-up through this office with  the next scheduled revisit.  A prescription was sent in for the gabapentin.  Jill Alexanders MD 09/22/2017 1:42 PM  [facility] [address] [place], [place] [address]  Phone [phone] Fax [phone]

## 2017-09-22 NOTE — Patient Instructions (Signed)
   We will go up on the gabapentin 100 mg dose to one twice during the day and 3 at night.  We will get an epidural steroid injection.

## 2017-09-22 NOTE — Assessment & Plan Note (Signed)
CT chest 2012:  Bronchiectasis in RML, lingula, LLL. PFT's 06/2011: FEV 1 1.79 L (87%) +airtrapping, no restriction, DLCO 76% pred.  Sputum 06/2011: normal flora, no AFB seen Serum Ig's 2012: normal levels.  No change in breathing or mucus with a trial of arcapta or dulera.  Sputum CS 04/2012:  pansensitive pseudomonas 04/2015 ONO: O2 saturation < 88% for 1 hour 29 minutes Sputum 06/2016> pseudomonas 09/2016 HRCT> bronchiolitis, tree-in-bud abnormalities worrisome for MAI   09/21/2017  After extensive coaching inhaler device  effectiveness =    75% from a baseline of 25%   Acute flare of bronchiectasis clinically in pt colonized with pan sens pseudomonanas  rec cipro 750 bid x 10 d Max mucinex /flutter Work on hfa  F/u With Dr Lake Bells   I had an extended discussion with the patient reviewing all relevant studies completed to date and  lasting 15 to 20 minutes of a 25 minute acute office visit in pt new to me     Each maintenance medication was reviewed in detail including most importantly the difference between maintenance and prns and under what circumstances the prns are to be triggered using an action plan format that is not reflected in the computer generated alphabetically organized AVS.    Please see AVS for specific instructions unique to this visit that I personally wrote and verbalized to the the pt in detail and then reviewed with pt  by my nurse highlighting any  changes in therapy recommended at today's visit to their plan of care.

## 2017-09-22 NOTE — Progress Notes (Signed)
Spoke with pt and notified of results per Dr. Wert. Pt verbalized understanding and denied any questions. 

## 2017-09-27 ENCOUNTER — Encounter (INDEPENDENT_AMBULATORY_CARE_PROVIDER_SITE_OTHER): Payer: Medicare Other | Admitting: Ophthalmology

## 2017-09-27 DIAGNOSIS — H43813 Vitreous degeneration, bilateral: Secondary | ICD-10-CM | POA: Diagnosis not present

## 2017-09-27 DIAGNOSIS — H353221 Exudative age-related macular degeneration, left eye, with active choroidal neovascularization: Secondary | ICD-10-CM | POA: Diagnosis not present

## 2017-09-27 DIAGNOSIS — H2511 Age-related nuclear cataract, right eye: Secondary | ICD-10-CM

## 2017-09-27 DIAGNOSIS — H353114 Nonexudative age-related macular degeneration, right eye, advanced atrophic with subfoveal involvement: Secondary | ICD-10-CM

## 2017-09-28 ENCOUNTER — Other Ambulatory Visit: Payer: Self-pay | Admitting: Neurology

## 2017-09-28 DIAGNOSIS — G8929 Other chronic pain: Secondary | ICD-10-CM

## 2017-09-28 DIAGNOSIS — M5441 Lumbago with sciatica, right side: Principal | ICD-10-CM

## 2017-09-29 DIAGNOSIS — L080 Pyoderma: Secondary | ICD-10-CM | POA: Diagnosis not present

## 2017-09-29 DIAGNOSIS — L814 Other melanin hyperpigmentation: Secondary | ICD-10-CM | POA: Diagnosis not present

## 2017-09-29 DIAGNOSIS — L218 Other seborrheic dermatitis: Secondary | ICD-10-CM | POA: Diagnosis not present

## 2017-09-29 DIAGNOSIS — L570 Actinic keratosis: Secondary | ICD-10-CM | POA: Diagnosis not present

## 2017-09-29 DIAGNOSIS — Z85828 Personal history of other malignant neoplasm of skin: Secondary | ICD-10-CM | POA: Diagnosis not present

## 2017-09-29 DIAGNOSIS — L821 Other seborrheic keratosis: Secondary | ICD-10-CM | POA: Diagnosis not present

## 2017-10-06 ENCOUNTER — Ambulatory Visit
Admission: RE | Admit: 2017-10-06 | Discharge: 2017-10-06 | Disposition: A | Discharge status: Home / Self Care | Payer: Medicare Other | Source: Ambulatory Visit | Attending: Neurology | Admitting: Neurology

## 2017-10-06 DIAGNOSIS — M5441 Lumbago with sciatica, right side: Principal | ICD-10-CM

## 2017-10-06 DIAGNOSIS — M48061 Spinal stenosis, lumbar region without neurogenic claudication: Secondary | ICD-10-CM | POA: Diagnosis not present

## 2017-10-06 DIAGNOSIS — G8929 Other chronic pain: Secondary | ICD-10-CM

## 2017-10-06 MED ORDER — IOPAMIDOL (ISOVUE-M 200) INJECTION 41%
1.0000 mL | Freq: Once | INTRAMUSCULAR | Status: AC
  Administered 2017-10-06: 1 mL via EPIDURAL

## 2017-10-06 MED ORDER — METHYLPREDNISOLONE ACETATE 40 MG/ML INJ SUSP (RADIOLOG
120.0000 mg | Freq: Once | INTRAMUSCULAR | Status: AC
  Administered 2017-10-06: 120 mg via EPIDURAL

## 2017-10-06 NOTE — Discharge Instructions (Signed)

## 2017-10-14 DIAGNOSIS — R0600 Dyspnea, unspecified: Secondary | ICD-10-CM | POA: Diagnosis not present

## 2017-10-14 DIAGNOSIS — M797 Fibromyalgia: Secondary | ICD-10-CM | POA: Diagnosis not present

## 2017-10-14 DIAGNOSIS — E785 Hyperlipidemia, unspecified: Secondary | ICD-10-CM | POA: Diagnosis not present

## 2017-10-14 DIAGNOSIS — I5032 Chronic diastolic (congestive) heart failure: Secondary | ICD-10-CM | POA: Diagnosis not present

## 2017-10-14 DIAGNOSIS — I872 Venous insufficiency (chronic) (peripheral): Secondary | ICD-10-CM | POA: Diagnosis not present

## 2017-10-14 DIAGNOSIS — F411 Generalized anxiety disorder: Secondary | ICD-10-CM | POA: Diagnosis not present

## 2017-10-14 DIAGNOSIS — I359 Nonrheumatic aortic valve disorder, unspecified: Secondary | ICD-10-CM | POA: Diagnosis not present

## 2017-10-14 DIAGNOSIS — R079 Chest pain, unspecified: Secondary | ICD-10-CM | POA: Diagnosis not present

## 2017-10-14 DIAGNOSIS — J449 Chronic obstructive pulmonary disease, unspecified: Secondary | ICD-10-CM | POA: Diagnosis not present

## 2017-10-15 ENCOUNTER — Ambulatory Visit: Payer: Medicare Other | Admitting: Pulmonary Disease

## 2017-10-18 ENCOUNTER — Telehealth: Payer: Self-pay | Admitting: Neurology

## 2017-10-18 DIAGNOSIS — M47816 Spondylosis without myelopathy or radiculopathy, lumbar region: Secondary | ICD-10-CM

## 2017-10-18 NOTE — Telephone Encounter (Signed)
I called the patient, she did get some benefit from the first epidural steroid injection a couple weeks ago.  She is still having some discomfort, wants to repeat the epidural.  I will try to order this, but some individuals with Medicare are restricted, they can only have epidurals once every 2 months.

## 2017-10-18 NOTE — Telephone Encounter (Signed)
Pt is wanting an appt for another injection. Said she could bend over now without hollering but she feels she needs another injection. Please call to advise

## 2017-10-19 ENCOUNTER — Other Ambulatory Visit: Payer: Self-pay | Admitting: Neurology

## 2017-10-19 DIAGNOSIS — M47817 Spondylosis without myelopathy or radiculopathy, lumbosacral region: Secondary | ICD-10-CM

## 2017-10-26 ENCOUNTER — Inpatient Hospital Stay
Admission: RE | Admit: 2017-10-26 | Discharge: 2017-10-26 | Disposition: A | Discharge status: Home / Self Care | Payer: Medicare Other | Source: Ambulatory Visit | Attending: Neurology | Admitting: Neurology

## 2017-11-01 ENCOUNTER — Ambulatory Visit
Admission: RE | Admit: 2017-11-01 | Discharge: 2017-11-01 | Disposition: A | Discharge status: Home / Self Care | Payer: Medicare Other | Source: Ambulatory Visit | Attending: Neurology | Admitting: Neurology

## 2017-11-01 DIAGNOSIS — M47817 Spondylosis without myelopathy or radiculopathy, lumbosacral region: Secondary | ICD-10-CM

## 2017-11-01 MED ORDER — METHYLPREDNISOLONE ACETATE 40 MG/ML INJ SUSP (RADIOLOG
120.0000 mg | Freq: Once | INTRAMUSCULAR | Status: AC
  Administered 2017-11-01: 120 mg via EPIDURAL

## 2017-11-01 MED ORDER — IOPAMIDOL (ISOVUE-M 200) INJECTION 41%
1.0000 mL | Freq: Once | INTRAMUSCULAR | Status: AC
  Administered 2017-11-01: 1 mL via EPIDURAL

## 2017-11-01 NOTE — Discharge Instructions (Signed)

## 2017-12-02 DIAGNOSIS — Z681 Body mass index (BMI) 19 or less, adult: Secondary | ICD-10-CM | POA: Diagnosis not present

## 2017-12-02 DIAGNOSIS — J449 Chronic obstructive pulmonary disease, unspecified: Secondary | ICD-10-CM | POA: Diagnosis not present

## 2017-12-02 DIAGNOSIS — K645 Perianal venous thrombosis: Secondary | ICD-10-CM | POA: Diagnosis not present

## 2017-12-08 ENCOUNTER — Encounter: Payer: Self-pay | Admitting: Pulmonary Disease

## 2017-12-08 ENCOUNTER — Telehealth: Payer: Self-pay | Admitting: Pulmonary Disease

## 2017-12-08 ENCOUNTER — Ambulatory Visit (INDEPENDENT_AMBULATORY_CARE_PROVIDER_SITE_OTHER): Payer: Medicare Other | Admitting: Pulmonary Disease

## 2017-12-08 VITALS — BP 138/76 | HR 82 | Ht 67.0 in | Wt 122.4 lb

## 2017-12-08 DIAGNOSIS — R0600 Dyspnea, unspecified: Secondary | ICD-10-CM

## 2017-12-08 DIAGNOSIS — J479 Bronchiectasis, uncomplicated: Secondary | ICD-10-CM | POA: Diagnosis not present

## 2017-12-08 DIAGNOSIS — G4734 Idiopathic sleep related nonobstructive alveolar hypoventilation: Secondary | ICD-10-CM | POA: Diagnosis not present

## 2017-12-08 NOTE — Addendum Note (Signed)
Addended by: Dolores Lory on: 12/08/2017 02:50 PM   Modules accepted: Orders

## 2017-12-08 NOTE — Patient Instructions (Signed)
Bronchiectasis without exacerbation: Use albuterol twice a day Use hypertonic saline nebulizer twice a day Use Symbicort 2 puffs twice a day Stay active Call us if you have any increase in chest congestion, shortness of breath, fevers or chills  Nocturnal hypoxemia: Keep using oxygen at night  Shortness of breath: We will check your oxygen level while you are walking here to make sure that it does not drop below 88%  We will see you back in 4 to 6 months or sooner if needed

## 2017-12-08 NOTE — Telephone Encounter (Signed)
lmtcb x 1 for Jason with  [facility] 

## 2017-12-08 NOTE — Progress Notes (Addendum)
Subjective:    Patient ID: [name], female    DOB: 07-31-1927, 84 y.o.   MRN: [id number]   Synopsis: Former patient of Dr. Gwenette Greet with bronchiectasis   HPI Chief Complaint  Patient presents with  . 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  Her husband has really been helping her a lot.    She continues to struggle with chest congestion and mucus production.  She is not using her mucociliary clearance measures.  Specifically she is not using hypertonic saline or the albuterol nebulizer.  She is taking Symbicort twice a day.  No recent pneumonia or bronchitis.   Past Medical History:  Diagnosis Date  . Anxiety   . Arthritis   . CHF (congestive heart failure) ([place])   . Complication of anesthesia    " I have to have a spinal beacuse my lungs & heart are bad "  . COPD (chronic obstructive pulmonary disease) ([place])   . Coronary artery disease   . Depression   . Fibromyalgia   . Hiatal hernia   . Hyperlipidemia   . Irregular heartbeat   . Osteoporosis   . Peripheral neuropathy 08/30/2017  . Rotator cuff tear    RIGHT  . UTI (urinary tract infection) 09/2014      Review of Systems  Constitutional: Negative for chills, fatigue and fever.  HENT: Negative for nosebleeds, postnasal drip and rhinorrhea.   Respiratory: Positive for cough and shortness of breath. Negative for wheezing.   Cardiovascular: Negative for chest pain, palpitations and leg swelling.       Objective:   Physical Exam Vitals:   12/08/17 1406  BP: 138/76  Pulse: 82  SpO2: 94%  Weight: 122 lb 6.4 oz (55.5 kg)  Height: 5\' 7"  (1.702 m)    RA  Today on ambulation her O2 saturation dropped to 88% on room air  Gen: chronically ill appearing HENT: OP clear, TM's clear, neck supple PULM: Crackles bases B, normal percussion CV: RRR, no mgr, trace edema GI: BS+, soft, nontender Derm: no cyanosis or rash Psyche: normal mood and affect    Chest  Imaging: CT chest 2012:  Bronchiectasis in RML, lingula, LLL. February 2018 high-resolution CT scan of the chest images independently reviewed showing predominantly right middle lobe and lingular bronchiectasis with some tree in bud abnormalities in the left lower lobe, trace pleural effusion on the)   PFT: PFT's 06/2011: +airtrapping, no restriction, DLCO 76% pred.  June 2018, and function testing ratio 79%, FEV1 1.46 L 83% predicted, FVC 1.84 L 77% predicted, total lung capacity 3.96 L 76% predicted, DLCO 11.94 46% predicted  Labs: Serum Ig's 2012: normal levels.    Micro: Sputum 06/2011: normal flora, no AFB seen Sputum CS 04/2012:  pansensitive pseudomonas November 2017 sputum culture pseudomonas November 2017 AFB culture negative November 2017 Fungus culture negative February 09/2016 Sputum: Pseudomonas pan sensitive February 2018 sputum AFB negative.  CBC    Component Value Date/Time   WBC 8.4 02/12/2016 1129   RBC 3.93 02/12/2016 1129   HGB 12.2 02/12/2016 1129   HCT 38.0 02/12/2016 1129   PLT 140 (L) 02/12/2016 1129   MCV 96.7 02/12/2016 1129   MCH 31.0 02/12/2016 1129   MCHC 32.1 02/12/2016 1129   RDW 13.6 02/12/2016 1129   LYMPHSABS 0.9 02/12/2016 1129   MONOABS 0.8 02/12/2016 1129   EOSABS 0.1 02/12/2016 1129   BASOSABS 0.0 02/12/2016 1129  Assessment & Plan:  Impression: Bronchiectasis without acute exacerbation ([place])  Nocturnal hypoxemia  Discussion: Lorrie has been a bit forgetful when it comes to her mucociliary clearance measures recently.  I counseled her today on the importance of using hypertonic saline and albuterol twice a day to help clear out mucus.  Plan: Bronchiectasis without exacerbation: Use albuterol twice a day Use hypertonic saline nebulizer twice a day Use Symbicort 2 puffs twice a day Stay active Call us if you have any increase in chest congestion, shortness of breath, fevers or chills  Nocturnal hypoxemia: Keep using  oxygen at night  Chronic respiratory failure with hypoxemia: Today on ambulation her O2 saturation dropped to 88% on room air, started on 2 L nasal cannula with exertion  We will see you back in 4 to 6 months or sooner if needed  Current Outpatient Medications:  .  acetaminophen (TYLENOL) 500 MG tablet, Take 2 tablets (1,000 mg total) by mouth every 8 (eight) hours. (Patient taking differently: Take 1,000 mg by mouth every 8 (eight) hours as needed for mild pain or headache. ), Disp: 30 tablet, Rfl: 0 .  albuterol (PROVENTIL HFA;VENTOLIN HFA) 108 (90 Base) MCG/ACT inhaler, Inhale 2 puffs into the lungs every 6 (six) hours as needed., Disp: 1 Inhaler, Rfl: 5 .  Astaxanthin 4 MG CAPS, Take 1 capsule by mouth daily., Disp: , Rfl:  .  Bilberry 150 MG CAPS, Take 1 capsule by mouth daily., Disp: , Rfl:  .  budesonide-formoterol (SYMBICORT) 80-4.5 MCG/ACT inhaler, Inhale 2 puffs into the lungs 2 (two) times daily., Disp: 1 Inhaler, Rfl: 11 .  calcium carbonate (TUMS - DOSED IN MG ELEMENTAL CALCIUM) 500 MG chewable tablet, Chew 2 tablets by mouth daily as needed (acid reflux)., Disp: , Rfl:  .  cephALEXin (KEFLEX) 500 MG capsule, Take 500 mg by mouth., Disp: , Rfl: 0 .  Cholecalciferol (VITAMIN D3) 1000 UNITS CAPS, Take 1 capsule by mouth daily.  , Disp: , Rfl:  .  ciprofloxacin (CIPRO) 750 MG tablet, Take 1 tablet (750 mg total) by mouth 2 (two) times daily., Disp: 20 tablet, Rfl: 0 .  clorazepate (TRANXENE) 7.5 MG tablet, Take 3.75 mg by mouth 2 (two) times daily as needed (muscle)., Disp: , Rfl:  .  folic acid (FOLVITE) 448 MCG tablet, Take 400 mcg by mouth daily., Disp: , Rfl:  .  furosemide (LASIX) 20 MG tablet, TAKE ONE TABLET BY MOUTH ONCE DAILY, Disp: 30 tablet, Rfl: 2 .  gabapentin (NEURONTIN) 100 MG capsule, One capsule twice a day and 3 at night, Disp: 150 capsule, Rfl: 3 .  HYDROcodone-homatropine (HYCODAN) 5-1.5 MG/5ML syrup, Take 5 mLs by mouth every 8 (eight) hours as needed for cough.,  Disp: 240 mL, Rfl: 0 .  hydrocortisone (ANUSOL-HC) 25 MG suppository, Place 25 mg rectally as needed for hemorrhoids. , Disp: , Rfl:  .  hydroxypropyl methylcellulose (ISOPTO TEARS) 2.5 % ophthalmic solution, Place 1 drop into both eyes as needed for dry eyes. , Disp: , Rfl:  .  levothyroxine (SYNTHROID, LEVOTHROID) 75 MCG tablet, Take 75 mcg by mouth daily.  , Disp: , Rfl:  .  Magnesium 250 MG TABS, Take 250 mg by mouth daily., Disp: , Rfl:  .  magnesium hydroxide (MILK OF MAGNESIA) 400 MG/5ML suspension, Take by mouth daily as needed for mild constipation., Disp: , Rfl:  .  Melatonin 10 MG TABS, Take 1 tablet by mouth daily., Disp: , Rfl:  .  meloxicam (MOBIC) 7.5 MG tablet, Take  7.5 mg by mouth daily., Disp: , Rfl:  .  Multiple Vitamin (MULTIVITAMIN) capsule, Take 1 capsule by mouth daily.  , Disp: , Rfl:  .  Multiple Vitamins-Minerals (ICAPS AREDS 2) CAPS, Take 1 capsule by mouth daily., Disp: , Rfl:  .  nitroGLYCERIN (NITROSTAT) 0.4 MG SL tablet, Place 0.4 mg under the tongue every 5 (five) minutes as needed for chest pain. , Disp: , Rfl:  .  oxybutynin (DITROPAN) 5 MG tablet, Take 5 mg by mouth daily. Reported on 02/14/2016, Disp: , Rfl:  .  OXYGEN, Inhale 2 L into the lungs at bedtime. , Disp: , Rfl:  .  Polyethyl Glycol-Propyl Glycol (SYSTANE) 0.4-0.3 % SOLN, Place 1 drop into both eyes 3 (three) times daily. , Disp: , Rfl:  .  polyethylene glycol (MIRALAX / GLYCOLAX) packet, Take 17 g by mouth daily as needed for mild constipation. , Disp: , Rfl:  .  Probiotic CAPS, Take 1 capsule by mouth daily., Disp: , Rfl:  .  sodium chloride HYPERTONIC 3 % nebulizer solution, Take by nebulization 2 (two) times daily., Disp: 360 mL, Rfl: 5 .  Valerian 500 MG CAPS, Take 500 mg by mouth daily., Disp: , Rfl:  .  vitamin B-12 (CYANOCOBALAMIN) 1000 MCG tablet, Take 1,000 mcg by mouth daily., Disp: , Rfl:  .  vitamin C (ASCORBIC ACID) 500 MG tablet, Take 500 mg by mouth daily., Disp: , Rfl:

## 2017-12-09 NOTE — Telephone Encounter (Signed)
Spoke to [place] with [facility], who requested that order for O2 included 2L pulse with exertion & 2L cont qhs. Order has been placed.  Nothing further is needed.

## 2017-12-10 NOTE — Addendum Note (Signed)
Addended by: Collier Salina on: 12/10/2017 10:45 AM   Modules accepted: Orders

## 2017-12-10 NOTE — Telephone Encounter (Signed)
Corene Cornea called back and the DME order still did not have 'simply go mini' within the order. New order placed. Will sign off.

## 2017-12-13 DIAGNOSIS — K644 Residual hemorrhoidal skin tags: Secondary | ICD-10-CM | POA: Diagnosis not present

## 2017-12-13 DIAGNOSIS — W57XXXA Bitten or stung by nonvenomous insect and other nonvenomous arthropods, initial encounter: Secondary | ICD-10-CM | POA: Diagnosis not present

## 2017-12-20 ENCOUNTER — Telehealth: Payer: Self-pay | Admitting: Pulmonary Disease

## 2017-12-20 DIAGNOSIS — J479 Bronchiectasis, uncomplicated: Secondary | ICD-10-CM

## 2017-12-20 DIAGNOSIS — J441 Chronic obstructive pulmonary disease with (acute) exacerbation: Secondary | ICD-10-CM

## 2017-12-20 NOTE — Telephone Encounter (Signed)
Called and spoke with patient, she states that since she has her POC and her concentrator at home she no longer needs her tanks. Patient is requesting them to be picked up so she doesn't have to have them.    BQ please advise, thank you.

## 2017-12-20 NOTE — Telephone Encounter (Signed)
OK 

## 2017-12-21 NOTE — Telephone Encounter (Signed)
Order placed for tanks to be picked up since patient will be using POC and home concentrator. Nothing further is needed at this time.

## 2018-01-04 ENCOUNTER — Encounter: Payer: Self-pay | Admitting: Gastroenterology

## 2018-01-17 ENCOUNTER — Encounter (INDEPENDENT_AMBULATORY_CARE_PROVIDER_SITE_OTHER): Payer: Medicare Other | Admitting: Ophthalmology

## 2018-01-17 DIAGNOSIS — H353114 Nonexudative age-related macular degeneration, right eye, advanced atrophic with subfoveal involvement: Secondary | ICD-10-CM

## 2018-01-17 DIAGNOSIS — H43813 Vitreous degeneration, bilateral: Secondary | ICD-10-CM | POA: Diagnosis not present

## 2018-01-17 DIAGNOSIS — H35371 Puckering of macula, right eye: Secondary | ICD-10-CM

## 2018-01-17 DIAGNOSIS — H353221 Exudative age-related macular degeneration, left eye, with active choroidal neovascularization: Secondary | ICD-10-CM

## 2018-01-20 ENCOUNTER — Other Ambulatory Visit (INDEPENDENT_AMBULATORY_CARE_PROVIDER_SITE_OTHER): Payer: Medicare Other

## 2018-01-20 ENCOUNTER — Ambulatory Visit (INDEPENDENT_AMBULATORY_CARE_PROVIDER_SITE_OTHER): Payer: Medicare Other | Admitting: Gastroenterology

## 2018-01-20 ENCOUNTER — Encounter: Payer: Self-pay | Admitting: Gastroenterology

## 2018-01-20 VITALS — BP 98/62 | HR 74 | Ht 67.0 in | Wt 117.2 lb

## 2018-01-20 DIAGNOSIS — R140 Abdominal distension (gaseous): Secondary | ICD-10-CM | POA: Diagnosis not present

## 2018-01-20 DIAGNOSIS — R634 Abnormal weight loss: Secondary | ICD-10-CM | POA: Diagnosis not present

## 2018-01-20 DIAGNOSIS — R110 Nausea: Secondary | ICD-10-CM | POA: Diagnosis not present

## 2018-01-20 DIAGNOSIS — K5900 Constipation, unspecified: Secondary | ICD-10-CM | POA: Diagnosis not present

## 2018-01-20 DIAGNOSIS — R112 Nausea with vomiting, unspecified: Secondary | ICD-10-CM | POA: Insufficient documentation

## 2018-01-20 LAB — CBC
HEMATOCRIT: 40.1 % (ref 36.0–46.0)
Hemoglobin: 13.2 g/dL (ref 12.0–15.0)
MCHC: 32.9 g/dL (ref 30.0–36.0)
MCV: 96.8 fl (ref 78.0–100.0)
Platelets: 205 10*3/uL (ref 150.0–400.0)
RBC: 4.14 Mil/uL (ref 3.87–5.11)
RDW: 14.8 % (ref 11.5–15.5)
WBC: 9.2 10*3/uL (ref 4.0–10.5)

## 2018-01-20 LAB — COMPREHENSIVE METABOLIC PANEL
ALBUMIN: 4.1 g/dL (ref 3.5–5.2)
ALT: 17 U/L (ref 0–35)
AST: 24 U/L (ref 0–37)
Alkaline Phosphatase: 87 U/L (ref 39–117)
BUN: 13 mg/dL (ref 6–23)
CHLORIDE: 98 meq/L (ref 96–112)
CO2: 33 mEq/L — ABNORMAL HIGH (ref 19–32)
Calcium: 9.6 mg/dL (ref 8.4–10.5)
Creatinine, Ser: 0.81 mg/dL (ref 0.40–1.20)
GFR: 70.79 mL/min (ref >60.00)
Glucose, Bld: 102 mg/dL — ABNORMAL HIGH (ref 70–99)
POTASSIUM: 4.6 meq/L (ref 3.5–5.1)
SODIUM: 138 meq/L (ref 135–145)
Total Bilirubin: 0.6 mg/dL (ref 0.2–1.2)
Total Protein: 7.8 g/dL (ref 6.0–8.3)

## 2018-01-20 MED ORDER — ONDANSETRON HCL 4 MG PO TABS: 4.0000 mg | ORAL_TABLET | Freq: Three times a day (TID) | ORAL | 0 refills | Status: DC | PRN

## 2018-01-20 NOTE — Patient Instructions (Addendum)
Your provider has requested that you go to the basement level for lab work before leaving today. Press "B" on the elevator. The lab is located at the first door on the left as you exit the elevator.  We have sent the following medications to your pharmacy for you to pick up at your convenience: Zofran 4 mg every 8 hours as needed for nausea  Use Miralax daily.   You have been scheduled for a CT scan of the abdomen and pelvis at [facility] ([address]---this is in the same building as Press [facility]).   You are scheduled on 02/01/18 at 10 am . You should arrive 15 minutes prior to your appointment time for registration. Please follow the written instructions below on the day of your exam:  WARNING: IF YOU ARE ALLERGIC TO IODINE/X-RAY DYE, PLEASE NOTIFY RADIOLOGY IMMEDIATELY AT [phone]! YOU WILL BE GIVEN A 13 HOUR PREMEDICATION PREP.  1) Do not eat or drink anything after 6 am (4 hours prior to your test) 2) You have been given 2 bottles of oral contrast to drink. The solution may taste better if refrigerated, but do NOT add ice or any other liquid to this solution. Shake well before drinking.    Drink 1 bottle of contrast @ 8 am (2 hours prior to your exam)  Drink 1 bottle of contrast @ 9 am (1 hour prior to your exam)  You may take any medications as prescribed with a small amount of water except for the following: Metformin, Glucophage, Glucovance, Avandamet, Riomet, Fortamet, Actoplus Met, Janumet, Glumetza or Metaglip. The above medications must be held the day of the exam AND 48 hours after the exam.  The purpose of you drinking the oral contrast is to aid in the visualization of your intestinal tract. The contrast solution may cause some diarrhea. Before your exam is started, you will be given a small amount of fluid to drink. Depending on your individual set of symptoms, you may also receive an intravenous injection of x-ray contrast/dye. Plan on being at  [facility] for 30 minutes or longer, depending on the type of exam you are having performed.  This test typically takes 30-45 minutes to complete.  If you have any questions regarding your exam or if you need to reschedule, you may call the CT department at [phone] between the hours of 8:00 am and 5:00 pm, Monday-Friday.  ________________________________________________________________________

## 2018-01-20 NOTE — Progress Notes (Signed)
01/20/2018 ISAMAR NAZIR [id number] 1927/03/31   HISTORY OF PRESENT ILLNESS: This is an 84 year old female who was referred here by her PCP, Dr. Forde Dandy, for evaluation of a myriad of GI complaints.  She reports nausea, generalized abdominal discomfort and bloating, constipation, and weight loss.  She has just felt bad over the past several weeks to months.  She also reports intermittent dizziness and what sounds like positional vertigo.  She says that her usual weight is about 125 to128 pounds.  At one point she was down to 110 pounds and today weighs 117 pounds.  She says that her stomach just hurts.  She takes Metamucil, but not using it daily and uses occasional milk of magnesia for her constipation.  Says that even when she takes the Metamucil daily she does not really feel like it helps her much.  Denies rectal bleeding.  GI history years ago (maybe 2005) with [facility].   Past Medical History:  Diagnosis Date  . Anxiety   . Arthritis   . CHF (congestive heart failure) ([place])   . Complication of anesthesia    " I have to have a spinal beacuse my lungs & heart are bad "  . COPD (chronic obstructive pulmonary disease) ([place])   . Coronary artery disease   . Depression   . Fibromyalgia   . Hiatal hernia   . Hyperlipidemia   . Irregular heartbeat   . Osteoporosis   . Peripheral neuropathy 08/30/2017  . Rotator cuff tear    RIGHT  . UTI (urinary tract infection) 09/2014   Past Surgical History:  Procedure Laterality Date  . APPENDECTOMY    . CATARACT EXTRACTION     Left  . DILATION AND CURETTAGE OF UTERUS     x2  . ELBOW SURGERY     Right  . HIP ARTHROPLASTY Right 09/22/2014   Procedure: ARTHROPLASTY BIPOLAR HIP;  Surgeon: [name], MD;  Location: [facility];  Service: Orthopedics;  Laterality: Right;  . TONSILLECTOMY    . TOTAL HIP REVISION Right 10/13/2014   Procedure: REVISION RIGHT TOTAL HIP POSTERIOR ;  Surgeon: Rod Can, MD;  Location: [facility];  Service: Orthopedics;   Laterality: Right;    reports that she has never smoked. She has never used smokeless tobacco. She reports that she does not drink alcohol or use drugs. family history includes Diabetes in her son; Heart attack in her brother, sister, and son; Heart failure in her mother; Hypertension in her brother and mother; Obesity in her son; Pancreatic cancer in her father; Stroke in her brother. Allergies  Allergen Reactions  . Shrimp [Shellfish Allergy]   . Fish Allergy Nausea And Vomiting    Extremely sick  . Other     Does not remember which "mycin" drug.       Outpatient Encounter Medications as of 01/20/2018  Medication Sig  . acetaminophen (TYLENOL) 500 MG tablet Take 2 tablets (1,000 mg total) by mouth every 8 (eight) hours. (Patient taking differently: Take 1,000 mg by mouth every 8 (eight) hours as needed for mild pain or headache. )  . albuterol (PROVENTIL HFA;VENTOLIN HFA) 108 (90 Base) MCG/ACT inhaler Inhale 2 puffs into the lungs every 6 (six) hours as needed.  . Astaxanthin 4 MG CAPS Take 1 capsule by mouth daily.  . Bilberry 150 MG CAPS Take 1 capsule by mouth daily.  . budesonide-formoterol (SYMBICORT) 80-4.5 MCG/ACT inhaler Inhale 2 puffs into the lungs 2 (two) times daily.  . calcium  carbonate (TUMS - DOSED IN MG ELEMENTAL CALCIUM) 500 MG chewable tablet Chew 2 tablets by mouth daily as needed (acid reflux).  . Cholecalciferol (VITAMIN D3) 1000 UNITS CAPS Take 1 capsule by mouth daily.    . clonazePAM (KLONOPIN) 0.5 MG tablet Take 0.5 mg by mouth 2 (two) times daily as needed for anxiety.  . clorazepate (TRANXENE) 7.5 MG tablet Take 3.75 mg by mouth 2 (two) times daily as needed (muscle).  Marland Kitchen dimenhyDRINATE (DRAMAMINE) 50 MG tablet Take 50 mg by mouth every 8 (eight) hours as needed.  . folic acid (FOLVITE) 161 MCG tablet Take 400 mcg by mouth daily.  . furosemide (LASIX) 20 MG tablet TAKE ONE TABLET BY MOUTH ONCE DAILY  . gabapentin (NEURONTIN) 100 MG capsule One capsule twice a  day and 3 at night  . HYDROcodone-homatropine (HYCODAN) 5-1.5 MG/5ML syrup Take 5 mLs by mouth every 8 (eight) hours as needed for cough.  . hydrocortisone (ANUSOL-HC) 25 MG suppository Place 25 mg rectally as needed for hemorrhoids.   . hydroxypropyl methylcellulose (ISOPTO TEARS) 2.5 % ophthalmic solution Place 1 drop into both eyes as needed for dry eyes.   Marland Kitchen levothyroxine (SYNTHROID, LEVOTHROID) 75 MCG tablet Take 75 mcg by mouth daily.    . Magnesium 250 MG TABS Take 250 mg by mouth daily.  . magnesium hydroxide (MILK OF MAGNESIA) 400 MG/5ML suspension Take by mouth daily as needed for mild constipation.  . Melatonin 10 MG TABS Take 1 tablet by mouth daily.  . meloxicam (MOBIC) 7.5 MG tablet Take 7.5 mg by mouth daily.  . Multiple Vitamin (MULTIVITAMIN) capsule Take 1 capsule by mouth daily.    . Multiple Vitamins-Minerals (ICAPS AREDS 2) CAPS Take 1 capsule by mouth daily.  . nitroGLYCERIN (NITROSTAT) 0.4 MG SL tablet Place 0.4 mg under the tongue every 5 (five) minutes as needed for chest pain.   Marland Kitchen oxybutynin (DITROPAN) 5 MG tablet Take 5 mg by mouth daily. Reported on 02/14/2016  . OXYGEN Inhale 2 L into the lungs at bedtime.   Vladimir Faster Glycol-Propyl Glycol (SYSTANE) 0.4-0.3 % SOLN Place 1 drop into both eyes 3 (three) times daily.   . polyethylene glycol (MIRALAX / GLYCOLAX) packet Take 17 g by mouth daily as needed for mild constipation.   . Probiotic CAPS Take 1 capsule by mouth daily.  . sodium chloride HYPERTONIC 3 % nebulizer solution Take by nebulization 2 (two) times daily.  . Valerian 500 MG CAPS Take 500 mg by mouth daily.  . vitamin B-12 (CYANOCOBALAMIN) 1000 MCG tablet Take 1,000 mcg by mouth daily.  . vitamin C (ASCORBIC ACID) 500 MG tablet Take 500 mg by mouth daily.  . [DISCONTINUED] cephALEXin (KEFLEX) 500 MG capsule Take 500 mg by mouth.  . [DISCONTINUED] ciprofloxacin (CIPRO) 750 MG tablet Take 1 tablet (750 mg total) by mouth 2 (two) times daily.   No  facility-administered encounter medications on file as of 01/20/2018.      REVIEW OF SYSTEMS  : All other systems reviewed and negative except where noted in the History of Present Illness.   PHYSICAL EXAM: BP 98/62   Pulse 74   Ht '5\' 7"'  (1.702 m)   Wt 117 lb 4 oz (53.2 kg)   BMI 18.36 kg/m  General: Well developed white female in no acute distress Head: Normocephalic and atraumatic Eyes:  Sclerae anicteric, conjunctiva pink. Ears: Normal auditory acuity Lungs: Clear throughout to auscultation; no increased WOB. Heart: Regular rate and rhythm; no M/R/G. Abdomen: Soft, non-distended.  BS present.  Non-tender. Musculoskeletal: Symmetrical with no gross deformities  Skin: No lesions on visible extremities Extremities: No edema  Neurological: Alert oriented x 4, grossly non-focal Psychological:  Alert and cooperative. Normal mood and affect  ASSESSMENT AND PLAN: *84 year old female with complaints of generalized abdominal discomfort, abdominal bloating, nausea, and constipation.  Also reports weight loss.  I am going to give her a prescription for Zofran to take as needed for nausea.  I have asked her to begin taking MiraLAX daily for her constipation in place of Metamucil and milk of magnesia.  I am going to schedule her for a CT scan of the abdomen and pelvis with contrast.  We will check a CBC and CMP today.   CC:  [name], MD

## 2018-01-21 NOTE — Progress Notes (Signed)
Agree with assessment and plan as outlined.  

## 2018-01-22 ENCOUNTER — Other Ambulatory Visit: Payer: Self-pay | Admitting: Pulmonary Disease

## 2018-01-22 MED ORDER — CIPROFLOXACIN HCL 500 MG PO TABS: 500.0000 mg | ORAL_TABLET | Freq: Two times a day (BID) | ORAL | 0 refills | Status: AC

## 2018-01-22 MED ORDER — DOXYCYCLINE HYCLATE 100 MG PO TABS: 100.0000 mg | ORAL_TABLET | Freq: Two times a day (BID) | ORAL | 0 refills | Status: DC

## 2018-01-22 NOTE — Progress Notes (Signed)
Patient with bronchiectasis c/o chest congestion and purulent sputum.  Most recent cultures with pan sens pseudomonas.  cipro called to pharmacy.  Patient to report to the ED for severe symptoms.

## 2018-01-23 ENCOUNTER — Telehealth: Payer: Self-pay | Admitting: Emergency Medicine

## 2018-01-23 NOTE — Telephone Encounter (Signed)
Patient called to check on status of her Cipro from the pharmacy - she has since learned from them that the script was received and is being prepared. Family going to pick it up now.   She is having increased purulent sputum, feels weak. I instructed her to start the cipro as planned, to go to the ED to be assessed if she worsens in any way.

## 2018-02-01 ENCOUNTER — Inpatient Hospital Stay: Admission: RE | Admit: 2018-02-01 | Payer: Medicare Other | Source: Ambulatory Visit

## 2018-02-01 ENCOUNTER — Telehealth: Payer: Self-pay | Admitting: Gastroenterology

## 2018-02-01 NOTE — Telephone Encounter (Signed)
Spoke to patient and updated her on the details of her procedure had to be rescheduled to 2 pm due to patient preference. She verbalized understanding but states she is really nauseous and doesn't know if she can drink the contrast. I advised her to take her Zofran beforehand. She verbalized understanding.

## 2018-02-01 NOTE — Telephone Encounter (Signed)
Pls call pt to give her information about CT scan scheduled for tomorrow.

## 2018-02-02 ENCOUNTER — Ambulatory Visit (INDEPENDENT_AMBULATORY_CARE_PROVIDER_SITE_OTHER)
Admission: RE | Admit: 2018-02-02 | Discharge: 2018-02-02 | Disposition: A | Discharge status: Home / Self Care | Payer: Medicare Other | Source: Ambulatory Visit | Attending: Gastroenterology | Admitting: Gastroenterology

## 2018-02-02 DIAGNOSIS — R109 Unspecified abdominal pain: Secondary | ICD-10-CM | POA: Diagnosis not present

## 2018-02-02 DIAGNOSIS — R634 Abnormal weight loss: Secondary | ICD-10-CM | POA: Diagnosis not present

## 2018-02-02 DIAGNOSIS — R140 Abdominal distension (gaseous): Secondary | ICD-10-CM

## 2018-02-02 MED ORDER — IOPAMIDOL (ISOVUE-300) INJECTION 61%
80.0000 mL | Freq: Once | INTRAVENOUS | Status: AC | PRN
  Administered 2018-02-02: 80 mL via INTRAVENOUS

## 2018-02-03 DIAGNOSIS — F3289 Other specified depressive episodes: Secondary | ICD-10-CM | POA: Diagnosis not present

## 2018-02-03 DIAGNOSIS — Z96641 Presence of right artificial hip joint: Secondary | ICD-10-CM | POA: Diagnosis not present

## 2018-02-03 DIAGNOSIS — I5030 Unspecified diastolic (congestive) heart failure: Secondary | ICD-10-CM | POA: Diagnosis not present

## 2018-02-03 DIAGNOSIS — F329 Major depressive disorder, single episode, unspecified: Secondary | ICD-10-CM | POA: Diagnosis not present

## 2018-02-03 DIAGNOSIS — K645 Perianal venous thrombosis: Secondary | ICD-10-CM | POA: Diagnosis not present

## 2018-02-03 DIAGNOSIS — G6289 Other specified polyneuropathies: Secondary | ICD-10-CM | POA: Diagnosis not present

## 2018-02-03 DIAGNOSIS — J449 Chronic obstructive pulmonary disease, unspecified: Secondary | ICD-10-CM | POA: Diagnosis not present

## 2018-02-03 DIAGNOSIS — M1990 Unspecified osteoarthritis, unspecified site: Secondary | ICD-10-CM | POA: Diagnosis not present

## 2018-02-03 DIAGNOSIS — E785 Hyperlipidemia, unspecified: Secondary | ICD-10-CM | POA: Diagnosis not present

## 2018-02-03 DIAGNOSIS — R42 Dizziness and giddiness: Secondary | ICD-10-CM | POA: Diagnosis not present

## 2018-02-03 DIAGNOSIS — G629 Polyneuropathy, unspecified: Secondary | ICD-10-CM | POA: Diagnosis not present

## 2018-02-03 DIAGNOSIS — F419 Anxiety disorder, unspecified: Secondary | ICD-10-CM | POA: Diagnosis not present

## 2018-02-03 DIAGNOSIS — I2510 Atherosclerotic heart disease of native coronary artery without angina pectoris: Secondary | ICD-10-CM | POA: Diagnosis not present

## 2018-02-03 DIAGNOSIS — F418 Other specified anxiety disorders: Secondary | ICD-10-CM | POA: Diagnosis not present

## 2018-02-03 DIAGNOSIS — J479 Bronchiectasis, uncomplicated: Secondary | ICD-10-CM | POA: Diagnosis not present

## 2018-02-03 DIAGNOSIS — M810 Age-related osteoporosis without current pathological fracture: Secondary | ICD-10-CM | POA: Diagnosis not present

## 2018-02-03 DIAGNOSIS — M797 Fibromyalgia: Secondary | ICD-10-CM | POA: Diagnosis not present

## 2018-02-07 ENCOUNTER — Telehealth: Payer: Self-pay | Admitting: Gastroenterology

## 2018-02-07 DIAGNOSIS — R42 Dizziness and giddiness: Secondary | ICD-10-CM | POA: Diagnosis not present

## 2018-02-07 DIAGNOSIS — I2510 Atherosclerotic heart disease of native coronary artery without angina pectoris: Secondary | ICD-10-CM | POA: Diagnosis not present

## 2018-02-07 DIAGNOSIS — I5030 Unspecified diastolic (congestive) heart failure: Secondary | ICD-10-CM | POA: Diagnosis not present

## 2018-02-07 DIAGNOSIS — K645 Perianal venous thrombosis: Secondary | ICD-10-CM | POA: Diagnosis not present

## 2018-02-07 DIAGNOSIS — M810 Age-related osteoporosis without current pathological fracture: Secondary | ICD-10-CM | POA: Diagnosis not present

## 2018-02-07 DIAGNOSIS — J449 Chronic obstructive pulmonary disease, unspecified: Secondary | ICD-10-CM | POA: Diagnosis not present

## 2018-02-07 NOTE — Telephone Encounter (Signed)
[name] the pt is calling for results of CT scan.  Thanks

## 2018-02-08 NOTE — Telephone Encounter (Signed)
CT scan showed some chronic lung changes, etc but no findings in her abdomen to account for her symptoms.  Has she been taking the Miralax?  How has she been feeling?

## 2018-02-08 NOTE — Telephone Encounter (Signed)
The pt states she is doing well on miralax and occasional MOM.  She also wants to let Janett Billow know she saw her on the news and she did great.

## 2018-02-08 NOTE — Telephone Encounter (Signed)
Saw me on the news??

## 2018-02-09 DIAGNOSIS — K645 Perianal venous thrombosis: Secondary | ICD-10-CM | POA: Diagnosis not present

## 2018-02-09 DIAGNOSIS — M810 Age-related osteoporosis without current pathological fracture: Secondary | ICD-10-CM | POA: Diagnosis not present

## 2018-02-09 DIAGNOSIS — I2510 Atherosclerotic heart disease of native coronary artery without angina pectoris: Secondary | ICD-10-CM | POA: Diagnosis not present

## 2018-02-09 DIAGNOSIS — I5030 Unspecified diastolic (congestive) heart failure: Secondary | ICD-10-CM | POA: Diagnosis not present

## 2018-02-09 DIAGNOSIS — R42 Dizziness and giddiness: Secondary | ICD-10-CM | POA: Diagnosis not present

## 2018-02-09 DIAGNOSIS — J449 Chronic obstructive pulmonary disease, unspecified: Secondary | ICD-10-CM | POA: Diagnosis not present

## 2018-02-15 DIAGNOSIS — M810 Age-related osteoporosis without current pathological fracture: Secondary | ICD-10-CM | POA: Diagnosis not present

## 2018-02-15 DIAGNOSIS — R42 Dizziness and giddiness: Secondary | ICD-10-CM | POA: Diagnosis not present

## 2018-02-15 DIAGNOSIS — K645 Perianal venous thrombosis: Secondary | ICD-10-CM | POA: Diagnosis not present

## 2018-02-15 DIAGNOSIS — I5030 Unspecified diastolic (congestive) heart failure: Secondary | ICD-10-CM | POA: Diagnosis not present

## 2018-02-15 DIAGNOSIS — J449 Chronic obstructive pulmonary disease, unspecified: Secondary | ICD-10-CM | POA: Diagnosis not present

## 2018-02-15 DIAGNOSIS — I2510 Atherosclerotic heart disease of native coronary artery without angina pectoris: Secondary | ICD-10-CM | POA: Diagnosis not present

## 2018-02-16 ENCOUNTER — Telehealth (INDEPENDENT_AMBULATORY_CARE_PROVIDER_SITE_OTHER): Payer: Self-pay | Admitting: *Deleted

## 2018-02-16 DIAGNOSIS — K645 Perianal venous thrombosis: Secondary | ICD-10-CM | POA: Diagnosis not present

## 2018-02-16 DIAGNOSIS — M810 Age-related osteoporosis without current pathological fracture: Secondary | ICD-10-CM | POA: Diagnosis not present

## 2018-02-16 DIAGNOSIS — J449 Chronic obstructive pulmonary disease, unspecified: Secondary | ICD-10-CM | POA: Diagnosis not present

## 2018-02-16 DIAGNOSIS — I2510 Atherosclerotic heart disease of native coronary artery without angina pectoris: Secondary | ICD-10-CM | POA: Diagnosis not present

## 2018-02-16 DIAGNOSIS — I5030 Unspecified diastolic (congestive) heart failure: Secondary | ICD-10-CM | POA: Diagnosis not present

## 2018-02-16 DIAGNOSIS — R42 Dizziness and giddiness: Secondary | ICD-10-CM | POA: Diagnosis not present

## 2018-02-18 NOTE — Telephone Encounter (Signed)
error 

## 2018-02-22 DIAGNOSIS — R42 Dizziness and giddiness: Secondary | ICD-10-CM | POA: Diagnosis not present

## 2018-02-22 DIAGNOSIS — J449 Chronic obstructive pulmonary disease, unspecified: Secondary | ICD-10-CM | POA: Diagnosis not present

## 2018-02-22 DIAGNOSIS — K645 Perianal venous thrombosis: Secondary | ICD-10-CM | POA: Diagnosis not present

## 2018-02-22 DIAGNOSIS — M810 Age-related osteoporosis without current pathological fracture: Secondary | ICD-10-CM | POA: Diagnosis not present

## 2018-02-22 DIAGNOSIS — I5030 Unspecified diastolic (congestive) heart failure: Secondary | ICD-10-CM | POA: Diagnosis not present

## 2018-02-22 DIAGNOSIS — I2510 Atherosclerotic heart disease of native coronary artery without angina pectoris: Secondary | ICD-10-CM | POA: Diagnosis not present

## 2018-02-24 DIAGNOSIS — I2510 Atherosclerotic heart disease of native coronary artery without angina pectoris: Secondary | ICD-10-CM | POA: Diagnosis not present

## 2018-02-24 DIAGNOSIS — I5030 Unspecified diastolic (congestive) heart failure: Secondary | ICD-10-CM | POA: Diagnosis not present

## 2018-02-24 DIAGNOSIS — K645 Perianal venous thrombosis: Secondary | ICD-10-CM | POA: Diagnosis not present

## 2018-02-24 DIAGNOSIS — J449 Chronic obstructive pulmonary disease, unspecified: Secondary | ICD-10-CM | POA: Diagnosis not present

## 2018-02-24 DIAGNOSIS — M810 Age-related osteoporosis without current pathological fracture: Secondary | ICD-10-CM | POA: Diagnosis not present

## 2018-02-24 DIAGNOSIS — R42 Dizziness and giddiness: Secondary | ICD-10-CM | POA: Diagnosis not present

## 2018-03-10 ENCOUNTER — Ambulatory Visit: Payer: Medicare Other | Admitting: Neurology

## 2018-03-14 ENCOUNTER — Encounter (INDEPENDENT_AMBULATORY_CARE_PROVIDER_SITE_OTHER): Payer: Medicare Other | Admitting: Ophthalmology

## 2018-03-14 DIAGNOSIS — H353221 Exudative age-related macular degeneration, left eye, with active choroidal neovascularization: Secondary | ICD-10-CM

## 2018-03-14 DIAGNOSIS — H353114 Nonexudative age-related macular degeneration, right eye, advanced atrophic with subfoveal involvement: Secondary | ICD-10-CM

## 2018-03-14 DIAGNOSIS — H43813 Vitreous degeneration, bilateral: Secondary | ICD-10-CM | POA: Diagnosis not present

## 2018-03-16 ENCOUNTER — Encounter (INDEPENDENT_AMBULATORY_CARE_PROVIDER_SITE_OTHER): Payer: Self-pay | Admitting: Physical Medicine and Rehabilitation

## 2018-03-16 ENCOUNTER — Ambulatory Visit (INDEPENDENT_AMBULATORY_CARE_PROVIDER_SITE_OTHER): Payer: Medicare Other | Admitting: Physical Medicine and Rehabilitation

## 2018-03-16 VITALS — BP 150/82 | HR 114 | Temp 98.2°F | Ht 67.0 in | Wt 100.0 lb

## 2018-03-16 DIAGNOSIS — G8929 Other chronic pain: Secondary | ICD-10-CM | POA: Diagnosis not present

## 2018-03-16 DIAGNOSIS — M5442 Lumbago with sciatica, left side: Secondary | ICD-10-CM | POA: Diagnosis not present

## 2018-03-16 DIAGNOSIS — M797 Fibromyalgia: Secondary | ICD-10-CM

## 2018-03-16 DIAGNOSIS — M47816 Spondylosis without myelopathy or radiculopathy, lumbar region: Secondary | ICD-10-CM | POA: Diagnosis not present

## 2018-03-16 DIAGNOSIS — R531 Weakness: Secondary | ICD-10-CM | POA: Diagnosis not present

## 2018-03-16 DIAGNOSIS — G894 Chronic pain syndrome: Secondary | ICD-10-CM | POA: Diagnosis not present

## 2018-03-16 DIAGNOSIS — M48062 Spinal stenosis, lumbar region with neurogenic claudication: Secondary | ICD-10-CM

## 2018-03-16 DIAGNOSIS — M5441 Lumbago with sciatica, right side: Secondary | ICD-10-CM | POA: Diagnosis not present

## 2018-03-16 NOTE — Progress Notes (Signed)
 .  Numeric Pain Rating Scale and Functional Assessment Average Pain 8 Pain Right Now 2 My pain is intermittent, dull and aching Pain is worse with: walking, standing and some activites Pain improves with: medication   In the last MONTH (on 0-10 scale) has pain interfered with the following?  1. General activity like being  able to carry out your everyday physical activities such as walking, climbing stairs, carrying groceries, or moving a chair?  Rating(8)  2. Relation with others like being able to carry out your usual social activities and roles such as  activities at home, at work and in your community. Rating(4)  3. Enjoyment of life such that you have  been bothered by emotional problems such as feeling anxious, depressed or irritable?  Rating(2)

## 2018-03-17 ENCOUNTER — Encounter (INDEPENDENT_AMBULATORY_CARE_PROVIDER_SITE_OTHER): Payer: Self-pay | Admitting: Physical Medicine and Rehabilitation

## 2018-03-17 NOTE — Progress Notes (Signed)
[name] - 84 y.o. female MRN [id number]  Date of birth: 1927/04/16  Office Visit Note: Visit Date: 03/16/2018 PCP: [name], MD Referred by: [name], MD  Subjective: Chief Complaint  Patient presents with  . Lower Back - Pain  . Left Leg - Pain  . Right Leg - Pain   HPI: [name] is an 84 year old female who is accompanied by her husband who I seen on several occasions for his multilevel stenosis and back and hip pain.  I have never seen [name] as a patient but I have met her before.  She comes in today at the request of Dr. [name] her primary care physician.  With the referral I did receive the last note, office note which was from a nurse practitioner in their office.  There really was not much mention of back pain other than the title of the referral was for potential back injections.  Nonetheless patient is here complaining of 8 out of 10 pain on average of her lower back that radiates into both right and left hip and legs.  She is a difficult historian as she has multiple medical issues ongoing.  She reports that she does get a lot of pain across the back particularly with walking and standing and better with sitting at rest.  She does use some Tylenol that seems to help to a degree.  She reports her pain is intermittent dull and aching can be quite strong and making her want to sit down if she stands for any length of time.  It does limit what she can do from a general activity standpoint.  She has a hard time determining areas of pain because she has a history of fibromyalgia as well as a history of poly-peripheral neuropathy.  She sees Dr. Floyde Parkins and neurology and his notes were available on epic to review.  He did obtain an MRI of her lumbar spine in February 2019.  This is reviewed today and reviewed with the patient at length.  He ordered epidural injection which was performed in March.  This was an L5-S1 interlaminar epidural steroid  injection performed at great [facility] imaging.  She reports the first injection did give her some relief.  She went back to have a second injection performed.  This was an interlaminar injection once again at the L3-4 level.  She reports that this actually made the symptoms worse.  She denies any foot drop or focal weakness but feels weak in general.  She has had some history of weight loss and difficulty maintaining weight and nutrition.  She has had digestive track problems and does follow with gastroenterology.  Her case is also complicated by COPD and bronchiectasis.  For her peripheral neuropathy she is on a tapering up dose of gabapentin.  This was completed through Dr. Jannifer Franklin.  She is unfortunately on multiple medications but is also on multiple vitamins and supplements.  She denies any recent fevers or night sweats but does get some night pain.  She has had a history of right total hip arthroplasty and periprosthetic fracture and revision.  She does have difficulty ambulating and does use a cane.  She reports a lot of swelling in the legs.   Review of Systems  Constitutional: Positive for malaise/fatigue and weight loss. Negative for chills and fever.  HENT: Negative for hearing loss and sinus pain.   Eyes: Negative for blurred vision, double vision and photophobia.  Respiratory: Positive for shortness  of breath. Negative for cough.   Cardiovascular: Negative for chest pain, palpitations and leg swelling.  Gastrointestinal: Negative for abdominal pain, nausea and vomiting.  Genitourinary: Negative for flank pain.  Musculoskeletal: Positive for back pain and joint pain. Negative for myalgias.  Skin: Negative for itching and rash.  Neurological: Positive for tingling and weakness. Negative for tremors and focal weakness.  Endo/Heme/Allergies: Negative.   Psychiatric/Behavioral: Negative for depression.  All other systems reviewed and are negative.  Otherwise per HPI.  Assessment &  Plan: Visit Diagnoses:  1. Spinal stenosis of lumbar region with neurogenic claudication   2. Spondylosis without myelopathy or radiculopathy, lumbar region   3. Chronic bilateral low back pain with bilateral sciatica   4. Weakness   5. Fibromyalgia   6. Chronic pain syndrome     Plan: Findings:  Chronic worsening severe low back and bilateral hip and leg pain consistent with a neurogenic claudication worse with standing and ambulating.  I think she also has some pain just from the facet arthropathy of the lower spine worse with facet joint loading on exam and worse with standing.  Her symptoms are exacerbated as well from a history of fibromyalgia.  We had a fairly long discussion on fibromyalgia and how it relates to pain complaints.  Further complicated from a standpoint of diagnostic information is that she has peripheral polyneuropathy.  She is followed by Floyde Parkins and he has started her on gabapentin which I think is a great start.  She will continue to follow with him for the neuropathy.  I did review the MRI imaging with her and her husband.  She actually has fairly significant stenosis at L3-4 and L4-5.  This is much worse on the actual images than you would expect by reading the report.  She clearly has facet arthropathy as well and some foraminal narrowing.  She did get better with one epidural that was done at L5-S1.  I suspect the epidural injection that was done at L3-4 made her more symptomatic because it was done at an interlaminar approach at a level that was pretty significantly narrowed.  I think the best approach since she did get some relief with epidural injection is to try this from a transforaminal approach at L3-4.  This would be a much easier injection for her and I typically do this style of injection when there is fairly severe stenosis.  This will be diagnostic and hopefully therapeutic and we can make further recommendations depending on the outcome.  We will get her back  for that injection.  Greater than 50% of this visit (total duration of visit 50 minutes) was spent in counseling and coordination of care discussing multiple complaints and confounding diagnoses of stenosis and spondylosis and peripheral neuropathy.    Meds & Orders: No orders of the defined types were placed in this encounter.  No orders of the defined types were placed in this encounter.   Follow-up: Return for Bilateral L3 transforaminal epidural steroid injection.   Procedures: No procedures performed  No notes on file   Clinical History: MRI LUMBAR SPINE WITHOUT CONTRAST  TECHNIQUE: Multiplanar, multisequence MR imaging of the lumbar spine was performed. No intravenous contrast was administered.  COMPARISON:  CT of the chest 09/21/2016 and 10/19/2011  FINDINGS: Segmentation: 5 non rib-bearing lumbar type vertebral bodies are present.  Alignment: Slight anterolisthesis is present at L3-4 and L4-5. Grade 1 retrolisthesis at L2-3 measures 6 mm. There is slight anterolisthesis at L1-2.  Vertebrae: The  lesion in the left half of the T11 vertebral body with extension into the posterior elements is stable from prior chest CTs back to 2013 and compatible with an atypical hemangioma.  A superior endplate fracture is noted anteriorly at L2. There is 20% loss of height.  There is minimal residual superior endplate marrow change. The fracture is near completely healed. No other acute fractures are present.  Conus medullaris and cauda equina: Conus extends to the T12-L1 level. Conus and cauda equina appear normal.  Paraspinal and other soft tissues: Limited imaging of the abdomen is unremarkable. No significant adenopathy is present.  Disc levels:  T12-L1: Negative.  L1-2: A rightward disc protrusion is associated with a fracture. Facet hypertrophy is worse on the right. There is some distortion of the central canal without significant stenosis. Mild  foraminal narrowing is worse on the right.  L2-3: There is uncovering of a broad-based disc protrusion. Mild facet hypertrophy is noted bilaterally. Mild left subarticular and bilateral foraminal narrowing is present.  L3-4: A broad-based disc protrusion is present. Moderate facet hypertrophy and ligamentum flavum thickening is noted. This results in mild subarticular and foraminal narrowing bilaterally.  L4-5: A broad-based disc protrusion is present. Moderate facet hypertrophy and ligamentum flavum thickening is noted. The mild subarticular and foraminal narrowing is worse on the right.  L5-S1: Negative.  IMPRESSION: 1. Near complete healing of a superior endplate compression fracture at L2 with 20% loss of height. 2. 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 3. Mild foraminal narrowing at L1-2 is worse on the right. 4. Mild left subarticular and bilateral foraminal narrowing at L2-3. 5. Mild subarticular and foraminal narrowing bilaterally at L3-4 and L4-5.   Electronically Signed   By: San Morelle M.D.   On: 09/14/2017 08:55   She reports that she has never smoked. She has never used smokeless tobacco. No results for input(s): HGBA1C, LABURIC in the last 8760 hours.  Objective:  VS:  HT:'5\' 7"'  (170.2 cm)   WT:100 lb (45.4 kg)  BMI:15.66    BP:(!) 150/82  HR:(!) 114bpm  TEMP:98.2 F (36.8 C)(Oral)  RESP:94 % Physical Exam  Constitutional: She is oriented to person, place, and time. No distress.  Frail appearing  HENT:  Head: Normocephalic and atraumatic.  Nose: Nose normal.  Mouth/Throat: Oropharynx is clear and moist.  Eyes: Conjunctivae and EOM are normal. Right eye exhibits no discharge. Left eye exhibits no discharge.  Neck: Neck supple. No JVD present. No tracheal deviation present.  Cardiovascular: Regular rhythm and intact distal pulses.  Pulmonary/Chest: Effort normal. No respiratory distress.  Abdominal: Soft. She exhibits no  distension. There is no guarding.  Musculoskeletal: She exhibits edema.  Patient has a difficult time going from sit to stand and does have pain concordant across her low back with extension and facet joint loading.  She does seem to have some tender points to light palpation across the lower back and PSIS and greater trochanters.  She does not have exquisite pain over the trochanters.  She has no pain with hip rotation.  She has fairly good distal strength but with significant swelling of the feet bilaterally.  She ambulates with a cane with a somewhat shuffling gait.  Lymphadenopathy:    She has no cervical adenopathy.  Neurological: She is alert and oriented to person, place, and time. She exhibits normal muscle tone. Coordination normal.  Skin: Skin is warm. No rash noted. No erythema.  Psychiatric: She has a normal mood and affect. Her  behavior is normal.  Nursing note and vitals reviewed.   Ortho Exam Imaging: No results found.  Past Medical/Family/Surgical/Social History: Medications & Allergies reviewed per EMR, new medications updated. Patient Active Problem List   Diagnosis Date Noted  . Abdominal distension (gaseous) 01/20/2018  . Abnormal weight loss 01/20/2018  . Nausea without vomiting 01/20/2018  . Constipation 01/20/2018  . Peripheral neuropathy 08/30/2017  . Peri-prosthetic fracture of femur following total hip arthroplasty 10/13/2014  . Protein-calorie malnutrition, severe ([place]) 10/12/2014  . Periprosthetic fracture around internal prosthetic right hip joint ([place]) 10/12/2014  . COPD exacerbation ([place])   . Acute cystitis without hematuria   . Thyroid activity decreased   . Hip fracture ([place]) 09/21/2014  . Fall at home 09/21/2014  . Syncope and collapse 07/02/2014  . Osteoporosis 04/10/2013  . Vaginal atrophy 04/10/2013  . Female pelvic pain 04/10/2013  . Hemoptysis, unspecified 10/12/2011  . Bronchiectasis without acute exacerbation ([place]) 05/19/2011   Past  Medical History:  Diagnosis Date  . Anxiety   . Arthritis   . CHF (congestive heart failure) ([place])   . Complication of anesthesia    " I have to have a spinal beacuse my lungs & heart are bad "  . COPD (chronic obstructive pulmonary disease) ([place])   . Coronary artery disease   . Depression   . Fibromyalgia   . Hiatal hernia   . Hyperlipidemia   . Irregular heartbeat   . Osteoporosis   . Peripheral neuropathy 08/30/2017  . Rotator cuff tear    RIGHT  . UTI (urinary tract infection) 09/2014   Family History  Problem Relation Age of Onset  . Pancreatic cancer Father   . Heart failure Mother   . Hypertension Mother   . Heart attack Brother   . Hypertension Brother   . Stroke Brother   . Heart attack Sister   . Diabetes Son   . Obesity Son   . Heart attack Son    Past Surgical History:  Procedure Laterality Date  . APPENDECTOMY    . CATARACT EXTRACTION     Left  . DILATION AND CURETTAGE OF UTERUS     x2  . ELBOW SURGERY     Right  . HIP ARTHROPLASTY Right 09/22/2014   Procedure: ARTHROPLASTY BIPOLAR HIP;  Surgeon: [name], MD;  Location: [facility];  Service: Orthopedics;  Laterality: Right;  . TONSILLECTOMY    . TOTAL HIP REVISION Right 10/13/2014   Procedure: REVISION RIGHT TOTAL HIP POSTERIOR ;  Surgeon: Rod Can, MD;  Location: [facility];  Service: Orthopedics;  Laterality: Right;   Social History   Occupational History  . 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 Smoking status: Never Smoker  . Smokeless tobacco: Never Used  Substance and Sexual Activity  . Alcohol use: No  . Drug use: No  . Sexual activity: Not on file

## 2018-03-21 ENCOUNTER — Ambulatory Visit (INDEPENDENT_AMBULATORY_CARE_PROVIDER_SITE_OTHER): Payer: Self-pay

## 2018-03-21 ENCOUNTER — Ambulatory Visit (INDEPENDENT_AMBULATORY_CARE_PROVIDER_SITE_OTHER): Payer: Medicare Other | Admitting: Physical Medicine and Rehabilitation

## 2018-03-21 ENCOUNTER — Encounter (INDEPENDENT_AMBULATORY_CARE_PROVIDER_SITE_OTHER): Payer: Self-pay | Admitting: Physical Medicine and Rehabilitation

## 2018-03-21 VITALS — BP 135/72 | HR 114 | Temp 97.8°F

## 2018-03-21 DIAGNOSIS — M48062 Spinal stenosis, lumbar region with neurogenic claudication: Secondary | ICD-10-CM

## 2018-03-21 DIAGNOSIS — M5416 Radiculopathy, lumbar region: Secondary | ICD-10-CM | POA: Diagnosis not present

## 2018-03-21 MED ORDER — BETAMETHASONE SOD PHOS & ACET 6 (3-3) MG/ML IJ SUSP
12.0000 mg | Freq: Once | INTRAMUSCULAR | Status: AC
  Administered 2018-03-21: 12 mg

## 2018-03-21 NOTE — Patient Instructions (Signed)

## 2018-03-21 NOTE — Progress Notes (Signed)
 .  Numeric Pain Rating Scale and Functional Assessment Average Pain 8   In the last MONTH (on 0-10 scale) has pain interfered with the following?  1. General activity like being  able to carry out your everyday physical activities such as walking, climbing stairs, carrying groceries, or moving a chair?  Rating(8)   +Driver, -BT, -Dye Allergies.  

## 2018-03-29 ENCOUNTER — Emergency Department (HOSPITAL_COMMUNITY): Payer: Medicare Other

## 2018-03-29 ENCOUNTER — Inpatient Hospital Stay (HOSPITAL_COMMUNITY)
Admission: EM | Admit: 2018-03-29 | Discharge: 2018-04-06 | DRG: 292 | Disposition: A | Discharge status: Home Health | Payer: Medicare Other | Attending: Internal Medicine | Admitting: Internal Medicine

## 2018-03-29 ENCOUNTER — Other Ambulatory Visit: Payer: Self-pay

## 2018-03-29 ENCOUNTER — Encounter (HOSPITAL_COMMUNITY): Payer: Self-pay | Admitting: Emergency Medicine

## 2018-03-29 DIAGNOSIS — M797 Fibromyalgia: Secondary | ICD-10-CM | POA: Diagnosis present

## 2018-03-29 DIAGNOSIS — T502X5A Adverse effect of carbonic-anhydrase inhibitors, benzothiadiazides and other diuretics, initial encounter: Secondary | ICD-10-CM | POA: Diagnosis present

## 2018-03-29 DIAGNOSIS — Z91013 Allergy to seafood: Secondary | ICD-10-CM | POA: Diagnosis not present

## 2018-03-29 DIAGNOSIS — F329 Major depressive disorder, single episode, unspecified: Secondary | ICD-10-CM | POA: Diagnosis present

## 2018-03-29 DIAGNOSIS — J479 Bronchiectasis, uncomplicated: Secondary | ICD-10-CM | POA: Diagnosis not present

## 2018-03-29 DIAGNOSIS — Z2239 Carrier of other specified bacterial diseases: Secondary | ICD-10-CM

## 2018-03-29 DIAGNOSIS — R64 Cachexia: Secondary | ICD-10-CM | POA: Diagnosis present

## 2018-03-29 DIAGNOSIS — Z9981 Dependence on supplemental oxygen: Secondary | ICD-10-CM | POA: Diagnosis not present

## 2018-03-29 DIAGNOSIS — I481 Persistent atrial fibrillation: Secondary | ICD-10-CM | POA: Diagnosis present

## 2018-03-29 DIAGNOSIS — Z888 Allergy status to other drugs, medicaments and biological substances status: Secondary | ICD-10-CM

## 2018-03-29 DIAGNOSIS — G894 Chronic pain syndrome: Secondary | ICD-10-CM | POA: Diagnosis present

## 2018-03-29 DIAGNOSIS — I4891 Unspecified atrial fibrillation: Secondary | ICD-10-CM

## 2018-03-29 DIAGNOSIS — E039 Hypothyroidism, unspecified: Secondary | ICD-10-CM

## 2018-03-29 DIAGNOSIS — G629 Polyneuropathy, unspecified: Secondary | ICD-10-CM | POA: Diagnosis present

## 2018-03-29 DIAGNOSIS — R109 Unspecified abdominal pain: Secondary | ICD-10-CM | POA: Diagnosis not present

## 2018-03-29 DIAGNOSIS — I499 Cardiac arrhythmia, unspecified: Secondary | ICD-10-CM | POA: Diagnosis not present

## 2018-03-29 DIAGNOSIS — I5033 Acute on chronic diastolic (congestive) heart failure: Secondary | ICD-10-CM | POA: Diagnosis present

## 2018-03-29 DIAGNOSIS — R112 Nausea with vomiting, unspecified: Secondary | ICD-10-CM | POA: Diagnosis not present

## 2018-03-29 DIAGNOSIS — R634 Abnormal weight loss: Secondary | ICD-10-CM | POA: Diagnosis not present

## 2018-03-29 DIAGNOSIS — Z96641 Presence of right artificial hip joint: Secondary | ICD-10-CM | POA: Diagnosis present

## 2018-03-29 DIAGNOSIS — Z7901 Long term (current) use of anticoagulants: Secondary | ICD-10-CM | POA: Diagnosis not present

## 2018-03-29 DIAGNOSIS — E876 Hypokalemia: Secondary | ICD-10-CM | POA: Diagnosis present

## 2018-03-29 DIAGNOSIS — I5023 Acute on chronic systolic (congestive) heart failure: Secondary | ICD-10-CM | POA: Diagnosis not present

## 2018-03-29 DIAGNOSIS — K567 Ileus, unspecified: Secondary | ICD-10-CM

## 2018-03-29 DIAGNOSIS — R609 Edema, unspecified: Secondary | ICD-10-CM | POA: Diagnosis not present

## 2018-03-29 DIAGNOSIS — I110 Hypertensive heart disease with heart failure: Secondary | ICD-10-CM | POA: Diagnosis not present

## 2018-03-29 DIAGNOSIS — R1310 Dysphagia, unspecified: Secondary | ICD-10-CM | POA: Diagnosis present

## 2018-03-29 DIAGNOSIS — Z79890 Hormone replacement therapy (postmenopausal): Secondary | ICD-10-CM

## 2018-03-29 DIAGNOSIS — I5030 Unspecified diastolic (congestive) heart failure: Secondary | ICD-10-CM | POA: Diagnosis present

## 2018-03-29 DIAGNOSIS — J90 Pleural effusion, not elsewhere classified: Secondary | ICD-10-CM | POA: Diagnosis not present

## 2018-03-29 DIAGNOSIS — J470 Bronchiectasis with acute lower respiratory infection: Secondary | ICD-10-CM | POA: Diagnosis present

## 2018-03-29 DIAGNOSIS — N179 Acute kidney failure, unspecified: Secondary | ICD-10-CM | POA: Diagnosis present

## 2018-03-29 DIAGNOSIS — I472 Ventricular tachycardia: Secondary | ICD-10-CM | POA: Diagnosis present

## 2018-03-29 DIAGNOSIS — R42 Dizziness and giddiness: Secondary | ICD-10-CM | POA: Diagnosis not present

## 2018-03-29 DIAGNOSIS — I480 Paroxysmal atrial fibrillation: Secondary | ICD-10-CM | POA: Diagnosis not present

## 2018-03-29 DIAGNOSIS — Z681 Body mass index (BMI) 19 or less, adult: Secondary | ICD-10-CM | POA: Diagnosis not present

## 2018-03-29 DIAGNOSIS — R197 Diarrhea, unspecified: Secondary | ICD-10-CM | POA: Diagnosis not present

## 2018-03-29 DIAGNOSIS — R140 Abdominal distension (gaseous): Secondary | ICD-10-CM | POA: Diagnosis not present

## 2018-03-29 DIAGNOSIS — I2510 Atherosclerotic heart disease of native coronary artery without angina pectoris: Secondary | ICD-10-CM | POA: Diagnosis present

## 2018-03-29 DIAGNOSIS — I509 Heart failure, unspecified: Secondary | ICD-10-CM | POA: Diagnosis not present

## 2018-03-29 DIAGNOSIS — I5032 Chronic diastolic (congestive) heart failure: Secondary | ICD-10-CM | POA: Diagnosis not present

## 2018-03-29 DIAGNOSIS — Z79899 Other long term (current) drug therapy: Secondary | ICD-10-CM

## 2018-03-29 DIAGNOSIS — Z7951 Long term (current) use of inhaled steroids: Secondary | ICD-10-CM

## 2018-03-29 DIAGNOSIS — K5900 Constipation, unspecified: Secondary | ICD-10-CM | POA: Diagnosis not present

## 2018-03-29 HISTORY — DX: Dependence on supplemental oxygen: Z99.81

## 2018-03-29 LAB — BASIC METABOLIC PANEL
ANION GAP: 14 (ref 5–15)
BUN: 15 mg/dL (ref 8–23)
CALCIUM: 9 mg/dL (ref 8.9–10.3)
CO2: 28 mmol/L (ref 22–32)
CREATININE: 0.79 mg/dL (ref 0.44–1.00)
Chloride: 96 mmol/L — ABNORMAL LOW (ref 98–111)
Glucose, Bld: 128 mg/dL — ABNORMAL HIGH (ref 70–99)
Potassium: 4.3 mmol/L (ref 3.5–5.1)
SODIUM: 138 mmol/L (ref 135–145)

## 2018-03-29 LAB — CBC
HCT: 39.6 % (ref 36.0–46.0)
Hemoglobin: 12.2 g/dL (ref 12.0–15.0)
MCH: 31.1 pg (ref 26.0–34.0)
MCHC: 30.8 g/dL (ref 30.0–36.0)
MCV: 101 fL — ABNORMAL HIGH (ref 78.0–100.0)
PLATELETS: 195 10*3/uL (ref 150–400)
RBC: 3.92 MIL/uL (ref 3.87–5.11)
RDW: 14.6 % (ref 11.5–15.5)
WBC: 8.3 10*3/uL (ref 4.0–10.5)

## 2018-03-29 LAB — HEPATIC FUNCTION PANEL
ALBUMIN: 3.7 g/dL (ref 3.5–5.0)
ALT: 28 U/L (ref 0–44)
AST: 37 U/L (ref 15–41)
Alkaline Phosphatase: 84 U/L (ref 38–126)
BILIRUBIN DIRECT: 0.3 mg/dL — AB (ref 0.0–0.2)
BILIRUBIN TOTAL: 1.2 mg/dL (ref 0.3–1.2)
Indirect Bilirubin: 0.9 mg/dL (ref 0.3–0.9)
Total Protein: 7.7 g/dL (ref 6.5–8.1)

## 2018-03-29 LAB — PROTIME-INR
INR: 1.12
PROTHROMBIN TIME: 14.3 s (ref 11.4–15.2)

## 2018-03-29 LAB — BRAIN NATRIURETIC PEPTIDE: B Natriuretic Peptide: 604.9 pg/mL — ABNORMAL HIGH (ref 0.0–100.0)

## 2018-03-29 LAB — TROPONIN I: Troponin I: <0.03 ng/mL (ref <0.03)

## 2018-03-29 LAB — I-STAT TROPONIN, ED: Troponin i, poc: 0.01 ng/mL (ref 0.00–0.08)

## 2018-03-29 MED ORDER — IOPAMIDOL (ISOVUE-370) INJECTION 76%
100.0000 mL | Freq: Once | INTRAVENOUS | Status: AC | PRN
  Administered 2018-03-29: 100 mL via INTRAVENOUS

## 2018-03-29 MED ORDER — MELATONIN 3 MG PO TABS
9.0000 mg | ORAL_TABLET | Freq: Every evening | ORAL | Status: DC | PRN
  Filled 2018-03-29: qty 3

## 2018-03-29 MED ORDER — PANTOPRAZOLE SODIUM 40 MG PO TBEC
40.0000 mg | DELAYED_RELEASE_TABLET | Freq: Every day | ORAL | Status: DC
  Administered 2018-03-30 – 2018-04-06 (×8): 40 mg via ORAL
  Filled 2018-03-29 (×8): qty 1

## 2018-03-29 MED ORDER — SODIUM CHLORIDE 0.9% FLUSH: 3.0000 mL | INTRAVENOUS | Status: DC | PRN

## 2018-03-29 MED ORDER — SODIUM CHLORIDE 0.9 % IV SOLN: 250.0000 mL | INTRAVENOUS | Status: DC | PRN

## 2018-03-29 MED ORDER — FUROSEMIDE 10 MG/ML IJ SOLN
40.0000 mg | Freq: Once | INTRAMUSCULAR | Status: AC
  Administered 2018-03-29: 40 mg via INTRAVENOUS
  Filled 2018-03-29: qty 4

## 2018-03-29 MED ORDER — CLORAZEPATE DIPOTASSIUM 3.75 MG PO TABS: 3.7500 mg | ORAL_TABLET | Freq: Two times a day (BID) | ORAL | Status: DC | PRN

## 2018-03-29 MED ORDER — FUROSEMIDE 10 MG/ML IJ SOLN
40.0000 mg | Freq: Two times a day (BID) | INTRAMUSCULAR | Status: DC
  Administered 2018-03-30 – 2018-04-01 (×6): 40 mg via INTRAVENOUS
  Filled 2018-03-29 (×6): qty 4

## 2018-03-29 MED ORDER — ENSURE ENLIVE PO LIQD
237.0000 mL | Freq: Two times a day (BID) | ORAL | Status: DC
  Administered 2018-03-30: 237 mL via ORAL

## 2018-03-29 MED ORDER — NITROGLYCERIN 0.4 MG SL SUBL: 0.4000 mg | SUBLINGUAL_TABLET | SUBLINGUAL | Status: DC | PRN

## 2018-03-29 MED ORDER — MOMETASONE FURO-FORMOTEROL FUM 100-5 MCG/ACT IN AERO: 2.0000 | INHALATION_SPRAY | Freq: Two times a day (BID) | RESPIRATORY_TRACT | Status: DC

## 2018-03-29 MED ORDER — FOLIC ACID 1 MG PO TABS
500.0000 ug | ORAL_TABLET | Freq: Every day | ORAL | Status: DC
  Administered 2018-03-30 – 2018-04-06 (×7): 0.5 mg via ORAL
  Filled 2018-03-29 (×8): qty 1

## 2018-03-29 MED ORDER — ENOXAPARIN SODIUM 60 MG/0.6ML ~~LOC~~ SOLN
45.0000 mg | Freq: Two times a day (BID) | SUBCUTANEOUS | Status: DC
  Administered 2018-03-30: 45 mg via SUBCUTANEOUS
  Filled 2018-03-29: qty 0.6

## 2018-03-29 MED ORDER — VITAMIN B-12 1000 MCG PO TABS
1000.0000 ug | ORAL_TABLET | Freq: Every day | ORAL | Status: DC
  Administered 2018-03-30 – 2018-04-06 (×7): 1000 ug via ORAL
  Filled 2018-03-29 (×8): qty 1

## 2018-03-29 MED ORDER — SODIUM CHLORIDE 0.9% FLUSH
3.0000 mL | Freq: Two times a day (BID) | INTRAVENOUS | Status: DC
  Administered 2018-03-30 – 2018-04-06 (×14): 3 mL via INTRAVENOUS

## 2018-03-29 MED ORDER — ONDANSETRON HCL 4 MG/2ML IJ SOLN
4.0000 mg | Freq: Four times a day (QID) | INTRAMUSCULAR | Status: DC | PRN
  Administered 2018-04-01 – 2018-04-02 (×2): 4 mg via INTRAVENOUS
  Filled 2018-03-29 (×4): qty 2

## 2018-03-29 MED ORDER — IPRATROPIUM-ALBUTEROL 0.5-2.5 (3) MG/3ML IN SOLN
3.0000 mL | RESPIRATORY_TRACT | Status: DC | PRN
  Administered 2018-03-29 – 2018-04-01 (×3): 3 mL via RESPIRATORY_TRACT
  Filled 2018-03-29 (×4): qty 3

## 2018-03-29 MED ORDER — ENOXAPARIN SODIUM 60 MG/0.6ML ~~LOC~~ SOLN
45.0000 mg | Freq: Once | SUBCUTANEOUS | Status: DC
  Filled 2018-03-29: qty 0.45

## 2018-03-29 MED ORDER — IOPAMIDOL (ISOVUE-370) INJECTION 76%
INTRAVENOUS | Status: AC
  Filled 2018-03-29: qty 100

## 2018-03-29 MED ORDER — LEVOTHYROXINE SODIUM 75 MCG PO TABS
75.0000 ug | ORAL_TABLET | Freq: Every day | ORAL | Status: DC
  Administered 2018-03-30 – 2018-04-06 (×8): 75 ug via ORAL
  Filled 2018-03-29 (×8): qty 1

## 2018-03-29 MED ORDER — ACETAMINOPHEN 325 MG PO TABS
650.0000 mg | ORAL_TABLET | ORAL | Status: DC | PRN
  Administered 2018-03-30 – 2018-04-02 (×4): 650 mg via ORAL
  Filled 2018-03-29 (×4): qty 2

## 2018-03-29 MED ORDER — DILTIAZEM HCL-DEXTROSE 100-5 MG/100ML-% IV SOLN (PREMIX)
5.0000 mg/h | Freq: Once | INTRAVENOUS | Status: AC
  Administered 2018-03-29: 5 mg/h via INTRAVENOUS
  Filled 2018-03-29: qty 100

## 2018-03-29 MED ORDER — DILTIAZEM HCL 25 MG/5ML IV SOLN
10.0000 mg | Freq: Once | INTRAVENOUS | Status: AC
  Administered 2018-03-29: 10 mg via INTRAVENOUS
  Filled 2018-03-29: qty 5

## 2018-03-29 MED ORDER — HYDROCODONE-HOMATROPINE 5-1.5 MG/5ML PO SYRP: 5.0000 mL | ORAL_SOLUTION | Freq: Three times a day (TID) | ORAL | Status: DC | PRN

## 2018-03-29 MED ORDER — SODIUM CHLORIDE 3 % IN NEBU
4.0000 mL | INHALATION_SOLUTION | Freq: Two times a day (BID) | RESPIRATORY_TRACT | Status: AC | PRN
  Filled 2018-03-29: qty 4

## 2018-03-29 NOTE — Progress Notes (Addendum)
ANTICOAGULATION CONSULT NOTE - Initial Consult  Pharmacy Consult:  Lovenox Indication: atrial fibrillation  Allergies  Allergen Reactions  . Shrimp [Shellfish Allergy] Nausea And Vomiting, Swelling and Other (See Comments)    Made her run a fever also  . Fish Allergy Nausea And Vomiting, Swelling and Other (See Comments)    Extremely sick  . Adhesive [Tape] Other (See Comments)    Tape BRUISES and TEARS THE SKIN; please use an alternative!!  . Other Other (See Comments)    Does not remember which "mycin" drug = yeast infection No seeds or nuts = Digestive isues    Patient Measurements: Height: 5\' 7"  (170.2 cm) Weight: 100 lb (45.4 kg) IBW/kg (Calculated) : 61.6  Vital Signs: Temp: 98.4 F (36.9 C) (08/27 1555) Temp Source: Oral (08/27 1555) BP: 156/100 (08/27 2045) Pulse Rate: 51 (08/27 2045)  Labs: Recent Labs    03/29/18 1611 03/29/18 1652  HGB 12.2  --   HCT 39.6  --   PLT 195  --   LABPROT  --  14.3  INR  --  1.12  CREATININE 0.79  --     Estimated Creatinine Clearance: 34.2 mL/min (by C-G formula based on SCr of 0.79 mg/dL).   Medical History: Past Medical History:  Diagnosis Date  . Anxiety   . Arthritis   . CHF (congestive heart failure) ([place])   . Complication of anesthesia    " I have to have a spinal beacuse my lungs & heart are bad "  . COPD (chronic obstructive pulmonary disease) ([place])   . Coronary artery disease   . Depression   . Fibromyalgia   . Hiatal hernia   . Hyperlipidemia   . Irregular heartbeat   . Osteoporosis   . Peripheral neuropathy 08/30/2017  . Rotator cuff tear    RIGHT  . UTI (urinary tract infection) 09/2014      Assessment: 4 YOF presented with neck pain, SOB and leg swelling.  She has Afib and CHF exacerbation.  Pharmacy has been consulted for Lovenox dosing for AFib.  Elderly patient with low body weight and reduced clearance; however, CrCL is still appropriate for BID dosing of Lovenox.  CBC WNL.   Goal of  Therapy:  Anti-Xa level 0.6-1 units/ml 4hrs after LMWH dose given Monitor platelets by anticoagulation protocol: Yes    Plan:  Lovenox 45mg  SQ Q12H CBC Q72H while on Lovenox   Symphany Fleissner D. Mina Marble, PharmD, BCPS, [place] 03/29/2018, 8:52 PM

## 2018-03-29 NOTE — ED Triage Notes (Signed)
Pt sent from dr's office for new onset afib and BLE edema. Pt denies chest pain, but endorses, SOB, and dizziness.

## 2018-03-29 NOTE — H&P (Signed)
History and Physical    TRENEE IGOE OVF:[id number] DOB: May 30, 1927 DOA: 03/29/2018  Referring MD/NP/PA: Shirlyn Goltz, MD PCP: [name], MD  Patient coming from: From PCP office  Chief Complaint: Shortness of breath and leg swelling  I have personally briefly reviewed patient's old medical records in [place]   HPI: [name] is a 84 y.o. female with medical history significant of diastolic CHF, CAD, COPD, anxiety, and depression; who presents with multiple complaints including leg swelling and shortness of breath.  Leg swelling has been present over the last 2 weeks or so.  Patient reports taking Lasix as advised and had been keeping legs elevated without relief of symptoms.  She complains of being short of breath with any exertion movement and generalized malaise.  Notes intermittent left-sided chest pain that is dull and aching consistency.  Associated symptoms include complete loss of 10 to 20 pounds over the last 6 months, poor appetite, loose stools this morning, difficulty swallowing for which she stopped taking certain medications, chronic cough, orthopnea sleeping in a raised hospital bed, intermittent lower abdominal pain with distention eating, and worsening vision from macular degeneration. Denies having any fever, loss of consciousness, hemoptysis, vomiting, blood in stool, or dysuria.  Patient is followed by Dr. Tollie Eth of cardiology.  She reports having a echocardiogram done few months ago.  She even had work-up for stomach issues with a CT scan of abdomen and pelvis on 7/3, that showed no acute abnormalities.  She had been seen by her primary care doctor today due to symptoms and was noted to be hypoxic at 88% on room air in atrial fibrillation at a rate of 130s.  She was sent to the hospital for further evaluation.  She normally is on oxygen 2 L at bedtime and as needed for shortness of breath with exertion.  ED Course: Upon admission into the  emergency department patient was seen to be afebrile, pulse is up to 134 found in atrial fibrillation, and O2 saturations maintained on room air at this time.  Labs revealed BNP of 604.9 and troponin 0.01.  CT angiogram of the chest obtained due to patient's symptoms revealed CHF with bilateral pleural effusions and edema.  It did not reveal any signs of a pulmonary embolus.  Patient was given 40 mg of Lasix IV, 10 mg of Cardizem without rate control,  and was transitioned to diltiazem drip. TR[facility] called to admit  Review of Systems  Constitutional: Positive for malaise/fatigue and weight loss. Negative for chills and fever.  HENT: Negative for nosebleeds and sinus pain.        Positive for difficulty swallowing  Eyes: Positive for blurred vision. Negative for pain.  Respiratory: Positive for cough, sputum production and shortness of breath.   Cardiovascular: Positive for chest pain, orthopnea and leg swelling.  Gastrointestinal: Positive for abdominal pain. Negative for blood in stool, nausea and vomiting.  Genitourinary: Negative for dysuria and hematuria.  Musculoskeletal: Positive for joint pain and myalgias. Negative for falls.  Skin: Negative for itching and rash.  Neurological: Negative for focal weakness and loss of consciousness.  Endo/Heme/Allergies: Bruises/bleeds easily.  Psychiatric/Behavioral: Negative for substance abuse. The patient has insomnia.     Past Medical History:  Diagnosis Date  . Anxiety   . Arthritis   . CHF (congestive heart failure) ([place])   . Complication of anesthesia    " I have to have a spinal beacuse my lungs & heart are bad "  .  COPD (chronic obstructive pulmonary disease) ([place])   . Coronary artery disease   . Depression   . Fibromyalgia   . Hiatal hernia   . Hyperlipidemia   . Irregular heartbeat   . Osteoporosis   . Peripheral neuropathy 08/30/2017  . Rotator cuff tear    RIGHT  . UTI (urinary tract infection) 09/2014    Past Surgical History:    Procedure Laterality Date  . APPENDECTOMY    . CATARACT EXTRACTION     Left  . DILATION AND CURETTAGE OF UTERUS     x2  . ELBOW SURGERY     Right  . HIP ARTHROPLASTY Right 09/22/2014   Procedure: ARTHROPLASTY BIPOLAR HIP;  Surgeon: [name], MD;  Location: [facility];  Service: Orthopedics;  Laterality: Right;  . TONSILLECTOMY    . TOTAL HIP REVISION Right 10/13/2014   Procedure: REVISION RIGHT TOTAL HIP POSTERIOR ;  Surgeon: Rod Can, MD;  Location: [facility];  Service: Orthopedics;  Laterality: Right;     reports that she has never smoked. She has never used smokeless tobacco. She reports that she does not drink alcohol or use drugs.  Allergies  Allergen Reactions  . Shrimp [Shellfish Allergy] Nausea And Vomiting, Swelling and Other (See Comments)    Made her run a fever also  . Fish Allergy Nausea And Vomiting, Swelling and Other (See Comments)    Extremely sick  . Adhesive [Tape] Other (See Comments)    Tape BRUISES and TEARS THE SKIN; please use an alternative!!  . Other Other (See Comments)    Does not remember which "mycin" drug = yeast infection No seeds or nuts = Digestive isues    Family History  Problem Relation Age of Onset  . Pancreatic cancer Father   . Heart failure Mother   . Hypertension Mother   . Heart attack Brother   . Hypertension Brother   . Stroke Brother   . Heart attack Sister   . Diabetes Son   . Obesity Son   . Heart attack Son     Prior to Admission medications   Medication Sig Start Date End Date Taking? Authorizing Provider  acetaminophen (TYLENOL) 500 MG tablet Take 2 tablets (1,000 mg total) by mouth every 8 (eight) hours. Patient taking differently: Take 1,000 mg by mouth every 8 (eight) hours as needed for mild pain or headache.  10/15/14  Yes Swinteck, Aaron Edelman, MD  albuterol (PROVENTIL HFA;VENTOLIN HFA) 108 (90 Base) MCG/ACT inhaler Inhale 2 puffs into the lungs every 6 (six) hours as needed. Patient taking differently: Inhale 2  puffs into the lungs every 6 (six) hours as needed for wheezing or shortness of breath.  01/21/17  Yes [name], Ronie Spies, MD  Astaxanthin 4 MG CAPS Take 4 mg by mouth daily.    Yes [provider]  Bilberry 150 MG CAPS Take 150 mg by mouth daily.    Yes [provider]  budesonide-formoterol (SYMBICORT) 80-4.5 MCG/ACT inhaler Inhale 2 puffs into the lungs 2 (two) times daily. Patient taking differently: Inhale 2 puffs into the lungs 2 (two) times daily as needed (for "flares").  09/21/17  Yes Tanda Rockers, MD  clorazepate (TRANXENE) 7.5 MG tablet Take 3.75 mg by mouth 2 (two) times daily as needed (for tightness in the stomach).    Yes [provider]  folic acid (FOLVITE) 809 MCG tablet Take 400 mcg by mouth daily.   Yes [provider]  furosemide (LASIX) 20 MG tablet TAKE ONE  TABLET BY MOUTH ONCE DAILY 03/19/16  Yes Mannam, Praveen, MD  HYDROcodone-homatropine (HYCODAN) 5-1.5 MG/5ML syrup Take 5 mLs by mouth every 8 (eight) hours as needed for cough. 02/14/16  Yes Mannam, Praveen, MD  hydrocortisone (ANUSOL-HC) 25 MG suppository Place 25 mg rectally as needed for hemorrhoids.    Yes [provider]  hydroxypropyl methylcellulose (ISOPTO TEARS) 2.5 % ophthalmic solution Place 1 drop into both eyes 3 (three) times daily as needed for dry eyes.    Yes [provider]  levothyroxine (SYNTHROID, LEVOTHROID) 75 MCG tablet Take 75 mcg by mouth daily.     Yes [provider]  magnesium hydroxide (MILK OF MAGNESIA) 400 MG/5ML suspension Take 5-15 mLs by mouth daily as needed for mild constipation.    Yes [provider]  Melatonin 10 MG TABS Take 10 mg by mouth at bedtime as needed (for sleep).    Yes [provider]  Multiple Vitamins-Minerals (ICAPS AREDS 2) CAPS Take 1 capsule by mouth 2 (two) times daily.    Yes [provider]  nitroGLYCERIN (NITROSTAT) 0.4 MG SL tablet Place 0.4 mg under the tongue every 5 (five)  minutes as needed for chest pain.    Yes [provider]  ondansetron (ZOFRAN) 4 MG tablet Take 1 tablet (4 mg total) by mouth every 8 (eight) hours as needed for nausea or vomiting. 01/20/18  Yes Zehr, Laban Emperor, PA-C  OXYGEN Inhale 2 L into the lungs See admin instructions. 2 liters at bedtime and may also use 2 liters during times of exertion as needed for shortness of breath   Yes [provider]  Papaya CHEW Chew 2-3 tablets by mouth as needed (for nausea).   Yes [provider]  Polyethyl Glycol-Propyl Glycol (SYSTANE) 0.4-0.3 % SOLN Place 1 drop into both eyes at bedtime as needed (for dry eyes).    Yes [provider]  sodium chloride HYPERTONIC 3 % nebulizer solution Take by nebulization 2 (two) times daily. Patient taking differently: Take 4 mLs by nebulization 2 (two) times daily as needed (for Bronchiectasis-related symptoms).  01/21/17  Yes Juanito Doom, MD  trimethoprim-polymyxin b (POLYTRIM) ophthalmic solution Place 1 drop into the left eye See admin instructions. Instill 1 drop into the left eye three times a day for the 1st day and 1 drop four times a day on the 2nd day AFTER INJECTION 03/14/18  Yes [provider]  vitamin B-12 (CYANOCOBALAMIN) 1000 MCG tablet Take 1,000 mcg by mouth daily.   Yes [provider]  Cholecalciferol (VITAMIN D3) 1000 UNITS CAPS Take 1,000 Units by mouth daily.     [provider]  gabapentin (NEURONTIN) 100 MG capsule One capsule twice a day and 3 at night Patient not taking: Reported on 03/29/2018 09/22/17   Kathrynn Ducking, MD  Probiotic CAPS Take 1 capsule by mouth daily.    [provider]  Valerian 500 MG CAPS Take 500 mg by mouth daily.    [provider]  vitamin C (ASCORBIC ACID) 500 MG tablet Take 500 mg by mouth daily.    [provider]    Physical Exam:  Constitutional: Frail elderly female who appears to be in some discomfort Vitals:    03/29/18 1700 03/29/18 1715 03/29/18 1815 03/29/18 1915  BP: (!) 127/91 (!) 162/88 90/75 115/88  Pulse: (!) 56 (!) 58 (!) 51 (!) 52  Resp: (!) 21 20 (!) 22 (!) 21  Temp:      TempSrc:  SpO2: 95% 94% 94% 95%  Weight:      Height:       Eyes: PERRL, lids and conjunctivae normal ENMT: Mucous membranes are moist. Posterior pharynx clear of any exudate or lesions.  Neck: normal, supple, no masses, no thyromegaly.  Positive JVD. Respiratory: Respiratory effort with positive crackles at the mid and lower lung fields.  No significant wheezes or rhonchi appreciated. Cardiovascular: Irregularly irregular, no murmurs / rubs / gallops.  At least +1 pitting lower extremity edema(patient with significant tenderness to palpation of lower extremities). 2+ pedal pulses. No carotid bruits.  Abdomen: no tenderness, no masses palpated. No hepatosplenomegaly. Bowel sounds positive.  Musculoskeletal: no clubbing / cyanosis.  Tenderness to palpation of lower extremities.  Skin: Thin skin with bruising present from IV placement. Neurologic: CN 2-12 grossly intact. Sensation intact, DTR normal. Strength 5/5 in all 4.  Psychiatric: Normal judgment and insight. Alert and oriented x 3. Normal mood.     Labs on Admission: I have personally reviewed following labs and imaging studies  CBC: Recent Labs  Lab 03/29/18 1611  WBC 8.3  HGB 12.2  HCT 39.6  MCV 101.0*  PLT 161   Basic Metabolic Panel: Recent Labs  Lab 03/29/18 1611  NA 138  K 4.3  CL 96*  CO2 28  GLUCOSE 128*  BUN 15  CREATININE 0.79  CALCIUM 9.0   GFR: Estimated Creatinine Clearance: 34.2 mL/min (by C-G formula based on SCr of 0.79 mg/dL). Liver Function Tests: Recent Labs  Lab 03/29/18 1612  AST 37  ALT 28  ALKPHOS 84  BILITOT 1.2  PROT 7.7  ALBUMIN 3.7   No results for input(s): LIPASE, AMYLASE in the last 168 hours. No results for input(s): AMMONIA in the last 168 hours. Coagulation Profile: Recent Labs  Lab  03/29/18 1652  INR 1.12   Cardiac Enzymes: No results for input(s): CKTOTAL, CKMB, CKMBINDEX, TROPONINI in the last 168 hours. BNP (last 3 results) No results for input(s): PROBNP in the last 8760 hours. HbA1C: No results for input(s): HGBA1C in the last 72 hours. CBG: No results for input(s): GLUCAP in the last 168 hours. Lipid Profile: No results for input(s): CHOL, HDL, LDLCALC, TRIG, CHOLHDL, LDLDIRECT in the last 72 hours. Thyroid Function Tests: No results for input(s): TSH, T4TOTAL, FREET4, T3FREE, THYROIDAB in the last 72 hours. Anemia Panel: No results for input(s): VITAMINB12, FOLATE, FERRITIN, TIBC, IRON, RETICCTPCT in the last 72 hours. Urine analysis:    Component Value Date/Time   COLORURINE YELLOW 02/12/2016 1125   APPEARANCEUR CLEAR 02/12/2016 1125   LABSPEC <1.005 (L) 02/12/2016 1125   PHURINE 7.0 02/12/2016 1125   GLUCOSEU NEGATIVE 02/12/2016 1125   HGBUR TRACE (A) 02/12/2016 1125   BILIRUBINUR NEGATIVE 02/12/2016 1125   KETONESUR NEGATIVE 02/12/2016 1125   PROTEINUR NEGATIVE 02/12/2016 1125   UROBILINOGEN 0.2 09/21/2014 1325   NITRITE NEGATIVE 02/12/2016 1125   LEUKOCYTESUR NEGATIVE 02/12/2016 1125   Sepsis Labs: No results found for this or any previous visit (from the past 240 hour(s)).   Radiological Exams on Admission: Ct Soft Tissue Neck W Contrast  Result Date: 03/29/2018 CLINICAL DATA:  Shortness of breath and dizziness, assess etiology within the neck. New onset atrial fibrillation. EXAM: CT NECK WITH CONTRAST TECHNIQUE: Multidetector CT imaging of the neck was performed using the standard protocol following the bolus administration of intravenous contrast. CONTRAST:  121mL ISOVUE-370 IOPAMIDOL (ISOVUE-370) INJECTION 76% COMPARISON:  None. FINDINGS: PHARYNX AND LARYNX: Normal.  Widely patent airway. SALIVARY GLANDS: Normal.  THYROID: Normal. LYMPH NODES: No lymphadenopathy by CT size criteria. VASCULAR: Moderate calcific atherosclerosis carotid  bifurcations. Calcific atherosclerosis aortic arch. Prominent posterior paraspinal veins, non-specific. LIMITED INTRACRANIAL: Normal. VISUALIZED ORBITS: Status post bilateral ocular lens implants. MASTOIDS AND VISUALIZED PARANASAL SINUSES: Small RIGHT maxillary mucosal retention cyst without paranasal sinus air-fluid levels. SKELETON: Nonacute. Exaggerated cervical lordosis. Moderate to severe mid cervical facet arthropathy. Severe temporomandibular osteoarthrosis. Torus palatini. Torus mandibularis. Scattered dental caries and periapical lucencies. Osteopenia. UPPER CHEST: Please see dedicated CT chest from same day, reported separately. OTHER: None. IMPRESSION: 1. No acute process in the neck.  Patent airway. Aortic Atherosclerosis (ICD10-I70.0). Electronically Signed   By: Elon Alas M.D.   On: 03/29/2018 19:19   Ct Angio Chest Pe W And/or Wo Contrast  Result Date: 03/29/2018 CLINICAL DATA:  New onset atrial fibrillation and lower extremity swelling. EXAM: CT ANGIOGRAPHY CHEST WITH CONTRAST TECHNIQUE: Multidetector CT imaging of the chest was performed using the standard protocol during bolus administration of intravenous contrast. Multiplanar CT image reconstructions and MIPs were obtained to evaluate the vascular anatomy. CONTRAST:  132mL ISOVUE-370 IOPAMIDOL (ISOVUE-370) INJECTION 76% COMPARISON:  Chest x-ray from earlier in the same day, MRI from 09/14/2017 FINDINGS: Cardiovascular: Thoracic aorta and its branches demonstrate atherosclerotic change without evidence of aneurysmal dilatation. Mild mitral calcifications are seen. Coronary calcifications are noted as well. The pulmonary artery is well visualize with a normal branching pattern. No filling defects are seen to suggest pulmonary embolism. Mediastinum/Nodes: Thoracic inlet is within normal limits. No sizable mediastinal adenopathy is noted. The esophagus is within normal limits. Lungs/Pleura: Lungs are hyperinflated consistent with COPD.  There are changes of bronchiectasis particularly in the lingula and right middle lobe. Some loculated fluid is noted adjacent to the inferior aspect of the right upper lobe and right middle lobe. Some mild changes of pulmonary edema are seen. Mild atelectatic changes are noted in the bases bilaterally although no focal confluent infiltrate is seen. Large bilateral pleural effusions are noted right greater than left. No sizable parenchymal nodules are seen. Upper Abdomen: Visualized upper abdomen is within normal limits. Musculoskeletal: Osseous structures again demonstrate a the lytic and somewhat expansile lesion in the posterior aspect of T 11 extending into the left pedicle consistent with atypical hemangioma stable from prior MRI examination from 09/14/2017 as well as a prior study from 2013. degenerative changes are noted as well as scattered other hemangiomas. Review of the MIP images confirms the above findings. IMPRESSION: No evidence of pulmonary emboli. Changes of CHF with bilateral pleural effusions and pulmonary edema. Chronic changes of bronchiectasis and COPD. Chronic changes in T11 consistent with the given clinical history of atypical hemangioma stable from 2013. Aortic Atherosclerosis (ICD10-I70.0) and Emphysema (ICD10-J43.9). Electronically Signed   By: Inez Catalina M.D.   On: 03/29/2018 19:24   Dg Chest Port 1 View  Result Date: 03/29/2018 CLINICAL DATA:  Pt sent from dr's office for new onset afib and BLE edema. Pt denies chest pain, but endorses, SOB, and dizziness. hx of CHF, COPD, CAD, irregular heartbeat. EXAM: PORTABLE CHEST 1 VIEW COMPARISON:  09/21/2017 FINDINGS: The patient is rotated towards the RIGHT. There are bilateral pleural effusions. Significant bronchitic changes are stable and probably chronic. There has been an increase in perihilar and bibasilar opacity, consistent with edema. No consolidations. IMPRESSION: Increased bilateral pleural effusions, perihilar and basilar  opacities consistent with edema superimposed on chronic lung disease. Electronically Signed   By: Nolon Nations M.D.   On: 03/29/2018 16:34  EKG: Independently reviewed.  Atrial fibrillation with RVR at 137 bpm  Assessment/Plan Diastolic CHF exacerbation: Acute.  Patient presents with shortness of breath and leg swelling.  Found to have enlarged heart with pulmonary edema on imaging.  BNP elevated at 600.  Patient was given 40 mg of Lasix IV.  Last EF noted to be 65 to 70% with grade 2 diastolic dysfunction in 25/8527.  Patient currently being followed in outpatient setting by Dr. Tollie Eth of cardiology. - Admit to a telemetry bed - Heart failure orders set  initiated  - Continuous pulse oximetry with nasal cannula oxygen as needed to keep O2 saturations >92% - Strict I&Os and daily weights - Elevate lower extremities - Lasix 40 mg IV Bid - Reassess in a.m. and adjust diuresis as needed. - May want to call in regards to recently performed echocardiogram by Dr. Wynonia Lawman - Optimize medical management as able - Message sent for cardiology to evaluate in a.m.   A. fib with RVR: CHA2DS2-VASc score = to at least 4 based off age, sex, and CHF history.  Patient with history of heart murmur, but never reported as atrial fibrillation. - Continue diltiazem drip - Trend troponins - Correct electrolytes as needed - Lovenox per pharmacy - Determine risk/benefits of anticoagulation  Bronchiectasis without acute COPD exacerbation: Chronic.  - Nasal cannula oxygen 2 L as needed - Continue pharmacy substitution of Dulera for Symbicort - Duonebs as needed  Hypothyroidism - Check TSH - Continue Levothyroxine  Dysphagia: Patient reports having difficulty swallowing pills.  Patient declines being evaluated by speech therapy. - Soft diet with Ensure supplements  Weight loss: Patient reports at least 10 to 20 pound weight loss over the last 6 months.  Unclear cause of symptoms at this time.   Recent CT imaging of the abdomen and pelvis from 7/30 showed no acute abnormalities.  Albumin appears within normal limits.  GI prophylaxis: Protonix DVT prophylaxis: Lovenox per pharmacy Code Status: Full  Family Communication: Discussed plan of care with the patient family present at bedside Disposition Plan: Discharge home in 2 to 3 days once medically stable Consults called: None Admission status: Inpatient   Norval Morton MD [facility] Pager [phone]   If 7PM-7AM, please contact night-coverage [email or web address] Password [facility]  03/29/2018, 7:47 PM

## 2018-03-29 NOTE — Procedures (Signed)
Lumbosacral Transforaminal Epidural Steroid Injection - Sub-Pedicular Approach with Fluoroscopic Guidance  Patient: [name]      Date of Birth: [birth year] MRN: [id number] PCP: [name], MD      Visit Date: 03/21/2018   Universal Protocol:    Date/Time: 03/21/2018  Consent Given By: the patient  Position: PRONE  Additional Comments: Vital signs were monitored before and after the procedure. Patient was prepped and draped in the usual sterile fashion. The correct patient, procedure, and site was verified.   Injection Procedure Details:  Procedure Site One Meds Administered:  Meds ordered this encounter  Medications  . betamethasone acetate-betamethasone sodium phosphate (CELESTONE) injection 12 mg    Laterality: Bilateral  Location/Site:  L3-L4  Needle size: 22 G  Needle type: Spinal  Needle Placement: Transforaminal  Findings:    -Comments: Excellent flow of contrast along the nerve and into the epidural space.  Procedure Details: After squaring off the end-plates to get a true AP view, the C-arm was positioned so that an oblique view of the foramen as noted above was visualized. The target area is just inferior to the "nose of the scotty dog" or sub pedicular. The soft tissues overlying this structure were infiltrated with 2-3 ml. of 1% Lidocaine without Epinephrine.  The spinal needle was inserted toward the target using a "trajectory" view along the fluoroscope beam.  Under AP and lateral visualization, the needle was advanced so it did not puncture dura and was located close the 6 O'Clock position of the pedical in AP tracterory. Biplanar projections were used to confirm position. Aspiration was confirmed to be negative for CSF and/or blood. A 1-2 ml. volume of Isovue-250 was injected and flow of contrast was noted at each level. Radiographs were obtained for documentation purposes.   After attaining the desired flow of contrast documented above, a  0.5 to 1.0 ml test dose of 0.25% Marcaine was injected into each respective transforaminal space.  The patient was observed for 90 seconds post injection.  After no sensory deficits were reported, and normal lower extremity motor function was noted,   the above injectate was administered so that equal amounts of the injectate were placed at each foramen (level) into the transforaminal epidural space.   Additional Comments:  The patient tolerated the procedure well Dressing: Band-Aid    Post-procedure details: Patient was observed during the procedure. Post-procedure instructions were reviewed.  Patient left the clinic in stable condition.

## 2018-03-29 NOTE — Progress Notes (Signed)
LANAI CONLEE - 84 y.o. female MRN [id number]  Date of birth: 10-27-1926  Office Visit Note: Visit Date: 03/21/2018 PCP: [name], MD Referred by: [name], MD  Subjective: Chief Complaint  Patient presents with  . Lower Back - Pain   HPI: Mrs. Bohac is a very pleasant 84 year old female who we recently saw for evaluation and management.  She comes in today for planned bilateral L3 transforaminal epidural steroid injection.  We see our prior note for further details and justification.   ROS Otherwise per HPI.  Assessment & Plan: Visit Diagnoses:  1. Lumbar radiculopathy   2. Spinal stenosis of lumbar region with neurogenic claudication     Plan: No additional findings.   Meds & Orders:  Meds ordered this encounter  Medications  . betamethasone acetate-betamethasone sodium phosphate (CELESTONE) injection 12 mg    Orders Placed This Encounter  Procedures  . XR C-ARM NO REPORT  . Epidural Steroid injection    Follow-up: Return if symptoms worsen or fail to improve.   Procedures: No procedures performed  Lumbosacral Transforaminal Epidural Steroid Injection - Sub-Pedicular Approach with Fluoroscopic Guidance  Patient: ARYKA COONRADT      Date of Birth: 07-15-1927 MRN: [id number] PCP: [name], MD      Visit Date: 03/21/2018   Universal Protocol:    Date/Time: 03/21/2018  Consent Given By: the patient  Position: PRONE  Additional Comments: Vital signs were monitored before and after the procedure. Patient was prepped and draped in the usual sterile fashion. The correct patient, procedure, and site was verified.   Injection Procedure Details:  Procedure Site One Meds Administered:  Meds ordered this encounter  Medications  . betamethasone acetate-betamethasone sodium phosphate (CELESTONE) injection 12 mg    Laterality: Bilateral  Location/Site:  L3-L4  Needle size: 22 G  Needle type: Spinal  Needle Placement:  Transforaminal  Findings:    -Comments: Excellent flow of contrast along the nerve and into the epidural space.  Procedure Details: After squaring off the end-plates to get a true AP view, the C-arm was positioned so that an oblique view of the foramen as noted above was visualized. The target area is just inferior to the "nose of the scotty dog" or sub pedicular. The soft tissues overlying this structure were infiltrated with 2-3 ml. of 1% Lidocaine without Epinephrine.  The spinal needle was inserted toward the target using a "trajectory" view along the fluoroscope beam.  Under AP and lateral visualization, the needle was advanced so it did not puncture dura and was located close the 6 O'Clock position of the pedical in AP tracterory. Biplanar projections were used to confirm position. Aspiration was confirmed to be negative for CSF and/or blood. A 1-2 ml. volume of Isovue-250 was injected and flow of contrast was noted at each level. Radiographs were obtained for documentation purposes.   After attaining the desired flow of contrast documented above, a 0.5 to 1.0 ml test dose of 0.25% Marcaine was injected into each respective transforaminal space.  The patient was observed for 90 seconds post injection.  After no sensory deficits were reported, and normal lower extremity motor function was noted,   the above injectate was administered so that equal amounts of the injectate were placed at each foramen (level) into the transforaminal epidural space.   Additional Comments:  The patient tolerated the procedure well Dressing: Band-Aid    Post-procedure details: Patient was observed during the procedure. Post-procedure instructions were reviewed.  Patient left the  clinic in stable condition.     Clinical History: MRI LUMBAR SPINE WITHOUT CONTRAST  TECHNIQUE: Multiplanar, multisequence MR imaging of the lumbar spine was performed. No intravenous contrast was  administered.  COMPARISON:  CT of the chest 09/21/2016 and 10/19/2011  FINDINGS: Segmentation: 5 non rib-bearing lumbar type vertebral bodies are present.  Alignment: Slight anterolisthesis is present at L3-4 and L4-5. Grade 1 retrolisthesis at L2-3 measures 6 mm. There is slight anterolisthesis at L1-2.  Vertebrae: The lesion in the left half of the T11 vertebral body with extension into the posterior elements is stable from prior chest CTs back to 2013 and compatible with an atypical hemangioma.  A superior endplate fracture is noted anteriorly at L2. There is 20% loss of height.  There is minimal residual superior endplate marrow change. The fracture is near completely healed. No other acute fractures are present.  Conus medullaris and cauda equina: Conus extends to the T12-L1 level. Conus and cauda equina appear normal.  Paraspinal and other soft tissues: Limited imaging of the abdomen is unremarkable. No significant adenopathy is present.  Disc levels:  T12-L1: Negative.  L1-2: A rightward disc protrusion is associated with a fracture. Facet hypertrophy is worse on the right. There is some distortion of the central canal without significant stenosis. Mild foraminal narrowing is worse on the right.  L2-3: There is uncovering of a broad-based disc protrusion. Mild facet hypertrophy is noted bilaterally. Mild left subarticular and bilateral foraminal narrowing is present.  L3-4: A broad-based disc protrusion is present. Moderate facet hypertrophy and ligamentum flavum thickening is noted. This results in mild subarticular and foraminal narrowing bilaterally.  L4-5: A broad-based disc protrusion is present. Moderate facet hypertrophy and ligamentum flavum thickening is noted. The mild subarticular and foraminal narrowing is worse on the right.  L5-S1: Negative.  IMPRESSION: 1. Near complete healing of a superior endplate compression fracture at  L2 with 20% loss of height. 2. 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 3. Mild foraminal narrowing at L1-2 is worse on the right. 4. Mild left subarticular and bilateral foraminal narrowing at L2-3. 5. Mild subarticular and foraminal narrowing bilaterally at L3-4 and L4-5.   Electronically Signed   By: San Morelle M.D.   On: 09/14/2017 08:55   She reports that she has never smoked. She has never used smokeless tobacco. No results for input(s): HGBA1C, LABURIC in the last 8760 hours.  Objective:  VS:  HT:    WT:   BMI:     BP:135/72  HR:(!) 114bpm  TEMP:97.8 F (36.6 C)(Oral)  RESP:(!) 89 % Physical Exam  Ortho Exam Imaging: No results found.  Past Medical/Family/Surgical/Social History: Medications & Allergies reviewed per EMR, new medications updated. Patient Active Problem List   Diagnosis Date Noted  . Abdominal distension (gaseous) 01/20/2018  . Abnormal weight loss 01/20/2018  . Nausea without vomiting 01/20/2018  . Constipation 01/20/2018  . Peripheral neuropathy 08/30/2017  . Peri-prosthetic fracture of femur following total hip arthroplasty 10/13/2014  . Protein-calorie malnutrition, severe ([place]) 10/12/2014  . Periprosthetic fracture around internal prosthetic right hip joint ([place]) 10/12/2014  . COPD exacerbation ([place])   . Acute cystitis without hematuria   . Thyroid activity decreased   . Hip fracture ([place]) 09/21/2014  . Fall at home 09/21/2014  . Syncope and collapse 07/02/2014  . Osteoporosis 04/10/2013  . Vaginal atrophy 04/10/2013  . Female pelvic pain 04/10/2013  . Hemoptysis, unspecified 10/12/2011  . Bronchiectasis without acute exacerbation ([place]) 05/19/2011   Past  Medical History:  Diagnosis Date  . Anxiety   . Arthritis   . CHF (congestive heart failure) ([place])   . Complication of anesthesia    " I have to have a spinal beacuse my lungs & heart are bad "  . COPD (chronic obstructive pulmonary disease) ([place])   . Coronary  artery disease   . Depression   . Fibromyalgia   . Hiatal hernia   . Hyperlipidemia   . Irregular heartbeat   . Osteoporosis   . Peripheral neuropathy 08/30/2017  . Rotator cuff tear    RIGHT  . UTI (urinary tract infection) 09/2014   Family History  Problem Relation Age of Onset  . Pancreatic cancer Father   . Heart failure Mother   . Hypertension Mother   . Heart attack Brother   . Hypertension Brother   . Stroke Brother   . Heart attack Sister   . Diabetes Son   . Obesity Son   . Heart attack Son    Past Surgical History:  Procedure Laterality Date  . APPENDECTOMY    . CATARACT EXTRACTION     Left  . DILATION AND CURETTAGE OF UTERUS     x2  . ELBOW SURGERY     Right  . HIP ARTHROPLASTY Right 09/22/2014   Procedure: ARTHROPLASTY BIPOLAR HIP;  Surgeon: [name], MD;  Location: [facility];  Service: Orthopedics;  Laterality: Right;  . TONSILLECTOMY    . TOTAL HIP REVISION Right 10/13/2014   Procedure: REVISION RIGHT TOTAL HIP POSTERIOR ;  Surgeon: Rod Can, MD;  Location: [facility];  Service: Orthopedics;  Laterality: Right;   Social History   Occupational History  . 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 Smoking status: Never Smoker  . Smokeless tobacco: Never Used  Substance and Sexual Activity  . Alcohol use: No  . Drug use: No  . Sexual activity: Not on file

## 2018-03-29 NOTE — ED Provider Notes (Signed)
[facility] EMERGENCY DEPARTMENT Provider Note   CSN: [id number] Arrival date & time: 03/29/18  1551     History   Chief Complaint Chief Complaint  Patient presents with  . Atrial Fibrillation    HPI [name] is a 84 y.o. female hx of CHF, COPD, CAD, here presenting with neck pain, shortness of breath, leg swelling.  Patient states that she has been having leg swelling for the last 2 weeks.  Have poor appetite for the last month or so but had a CT abdomen pelvis that was unremarkable about a month ago.  Continues to have poor appetite and then developed some trouble swallowing for the last several days.  Patient also has generalized weakness as well.  Patient states that she has some subjective shortness of breath especially with exertion but thought that this is from her COPD.  Patient went to see her primary care doctor and was noted to be in rapid A. Fib appears to be new onset.  Also was sent here for possible CHF exacerbation.  She was noted to be hypoxic 88% on room air at the office.   The history is provided by the patient.    Past Medical History:  Diagnosis Date  . Anxiety   . Arthritis   . CHF (congestive heart failure) ([place])   . Complication of anesthesia    " I have to have a spinal beacuse my lungs & heart are bad "  . COPD (chronic obstructive pulmonary disease) ([place])   . Coronary artery disease   . Depression   . Fibromyalgia   . Hiatal hernia   . Hyperlipidemia   . Irregular heartbeat   . Osteoporosis   . Peripheral neuropathy 08/30/2017  . Rotator cuff tear    RIGHT  . UTI (urinary tract infection) 09/2014    Patient Active Problem List   Diagnosis Date Noted  . Abdominal distension (gaseous) 01/20/2018  . Abnormal weight loss 01/20/2018  . Nausea without vomiting 01/20/2018  . Constipation 01/20/2018  . Peripheral neuropathy 08/30/2017  . Peri-prosthetic fracture of femur following total hip arthroplasty 10/13/2014  .  Protein-calorie malnutrition, severe ([place]) 10/12/2014  . Periprosthetic fracture around internal prosthetic right hip joint ([place]) 10/12/2014  . COPD exacerbation ([place])   . Acute cystitis without hematuria   . Thyroid activity decreased   . Hip fracture ([place]) 09/21/2014  . Fall at home 09/21/2014  . Syncope and collapse 07/02/2014  . Osteoporosis 04/10/2013  . Vaginal atrophy 04/10/2013  . Female pelvic pain 04/10/2013  . Hemoptysis, unspecified 10/12/2011  . Bronchiectasis without acute exacerbation ([place]) 05/19/2011    Past Surgical History:  Procedure Laterality Date  . APPENDECTOMY    . CATARACT EXTRACTION     Left  . DILATION AND CURETTAGE OF UTERUS     x2  . ELBOW SURGERY     Right  . HIP ARTHROPLASTY Right 09/22/2014   Procedure: ARTHROPLASTY BIPOLAR HIP;  Surgeon: [name], MD;  Location: [facility];  Service: Orthopedics;  Laterality: Right;  . TONSILLECTOMY    . TOTAL HIP REVISION Right 10/13/2014   Procedure: REVISION RIGHT TOTAL HIP POSTERIOR ;  Surgeon: Rod Can, MD;  Location: [facility];  Service: Orthopedics;  Laterality: Right;     OB History    Gravida  1   Para  1   Term      Preterm      AB      Living  1  SAB      TAB      Ectopic      Multiple      Live Births               Home Medications    Prior to Admission medications   Medication Sig Start Date End Date Taking? Authorizing Provider  albuterol (PROVENTIL HFA;VENTOLIN HFA) 108 (90 Base) MCG/ACT inhaler Inhale 2 puffs into the lungs every 6 (six) hours as needed. Patient taking differently: Inhale 2 puffs into the lungs every 6 (six) hours as needed for wheezing or shortness of breath.  01/21/17  Yes [name], Ronie Spies, MD  Astaxanthin 4 MG CAPS Take 4 mg by mouth daily.    Yes [provider]  Bilberry 150 MG CAPS Take 150 mg by mouth daily.    Yes [provider]  HYDROcodone-homatropine (HYCODAN) 5-1.5 MG/5ML syrup Take 5 mLs by mouth every 8 (eight)  hours as needed for cough. 02/14/16  Yes Mannam, Praveen, MD  hydrocortisone (ANUSOL-HC) 25 MG suppository Place 25 mg rectally as needed for hemorrhoids.    Yes [provider]  hydroxypropyl methylcellulose (ISOPTO TEARS) 2.5 % ophthalmic solution Place 1 drop into both eyes as needed for dry eyes.    Yes [provider]  levothyroxine (SYNTHROID, LEVOTHROID) 75 MCG tablet Take 75 mcg by mouth daily.     Yes [provider]  magnesium hydroxide (MILK OF MAGNESIA) 400 MG/5ML suspension Take by mouth daily as needed for mild constipation.   Yes [provider]  Multiple Vitamins-Minerals (ICAPS AREDS 2) CAPS Take 1 capsule by mouth 2 (two) times daily.    Yes [provider]  OXYGEN Inhale 2 L into the lungs See admin instructions. 2 liters at bedtime and may also use 2 liters during times of exertion as needed for shortness of breath   Yes [provider]  trimethoprim-polymyxin b (POLYTRIM) ophthalmic solution Place 1 drop into the left eye See admin instructions. Tid ffor 1 st da age=fet and 4 drops 2nd day after 03/14/18  Yes [provider]  acetaminophen (TYLENOL) 500 MG tablet Take 2 tablets (1,000 mg total) by mouth every 8 (eight) hours. Patient taking differently: Take 1,000 mg by mouth every 8 (eight) hours as needed for mild pain or headache.  10/15/14   Swinteck, Aaron Edelman, MD  budesonide-formoterol (SYMBICORT) 80-4.5 MCG/ACT inhaler Inhale 2 puffs into the lungs 2 (two) times daily. 09/21/17   Tanda Rockers, MD  calcium carbonate (TUMS - DOSED IN MG ELEMENTAL CALCIUM) 500 MG chewable tablet Chew 2 tablets by mouth daily as needed (acid reflux).    [provider]  Cholecalciferol (VITAMIN D3) 1000 UNITS CAPS Take 1 capsule by mouth daily.      [provider]  clonazePAM (KLONOPIN) 0.5 MG tablet Take 0.5 mg by mouth 2 (two) times daily as needed for anxiety.    [provider]  clorazepate (TRANXENE) 7.5  MG tablet Take 3.75 mg by mouth 2 (two) times daily as needed (muscle).    [provider]  dimenhyDRINATE (DRAMAMINE) 50 MG tablet Take 50 mg by mouth every 8 (eight) hours as needed.    [provider]  folic acid (FOLVITE) 202 MCG tablet Take 400 mcg by mouth daily.    [provider]  furosemide (LASIX) 20 MG tablet TAKE ONE TABLET BY MOUTH ONCE DAILY 03/19/16   Mannam, Praveen, MD  gabapentin (NEURONTIN) 100 MG capsule One capsule twice a day  and 3 at night 09/22/17   Kathrynn Ducking, MD  Magnesium 250 MG TABS Take 250 mg by mouth daily.    [provider]  Melatonin 10 MG TABS Take 1 tablet by mouth daily.    [provider]  meloxicam (MOBIC) 7.5 MG tablet Take 7.5 mg by mouth daily.    [provider]  Multiple Vitamin (MULTIVITAMIN) capsule Take 1 capsule by mouth daily.      [provider]  nitroGLYCERIN (NITROSTAT) 0.4 MG SL tablet Place 0.4 mg under the tongue every 5 (five) minutes as needed for chest pain.     [provider]  ondansetron (ZOFRAN) 4 MG tablet Take 1 tablet (4 mg total) by mouth every 8 (eight) hours as needed for nausea or vomiting. 01/20/18   Zehr, Laban Emperor, PA-C  Polyethyl Glycol-Propyl Glycol (SYSTANE) 0.4-0.3 % SOLN Place 1 drop into both eyes 3 (three) times daily.     [provider]  polyethylene glycol (MIRALAX / GLYCOLAX) packet Take 17 g by mouth daily as needed for mild constipation.     [provider]  Probiotic CAPS Take 1 capsule by mouth daily.    [provider]  sodium chloride HYPERTONIC 3 % nebulizer solution Take by nebulization 2 (two) times daily. 01/21/17   Juanito Doom, MD  Valerian 500 MG CAPS Take 500 mg by mouth daily.    [provider]  vitamin B-12 (CYANOCOBALAMIN) 1000 MCG tablet Take 1,000 mcg by mouth daily.    [provider]  vitamin C (ASCORBIC ACID) 500 MG tablet Take 500 mg by mouth daily.    [provider]    Family History Family History  Problem Relation Age of Onset  . Pancreatic cancer Father   . Heart failure Mother   . Hypertension Mother   . Heart attack Brother   . Hypertension Brother   . Stroke Brother   . Heart attack Sister   . Diabetes Son   . Obesity Son   . Heart attack Son     Social History Social History   Tobacco Use  . Smoking status: Never Smoker  . Smokeless tobacco: Never Used  Substance Use Topics  . Alcohol use: No  . Drug use: No     Allergies   Shrimp [shellfish allergy]; Fish allergy; Adhesive [tape]; and Other   Review of Systems Review of Systems  Respiratory: Positive for shortness of breath.   Cardiovascular: Positive for palpitations and leg swelling.  All other systems reviewed and are negative.    Physical Exam Updated Vital Signs BP (!) 155/114 (BP Location: Left Arm)   Pulse (!) 134   Temp 98.4 F (36.9 C) (Oral)   Resp 16   Ht 5\' 7"  (1.702 m)   Wt 45.4 kg   SpO2 95%   BMI 15.66 kg/m   Physical Exam  Constitutional: She is oriented to person, place, and time. She appears well-developed.  HENT:  Head: Normocephalic.  Mouth/Throat: Oropharynx is clear and moist.  Eyes: Pupils are equal, round, and reactive to light. Conjunctivae and EOM are normal.  Neck: Normal range of motion. Neck supple.  Cardiovascular:  Tachycardic, irregular   Pulmonary/Chest:  Slightly tachypneic, crackles bilateral bases   Abdominal: Soft. Bowel sounds are normal. She exhibits no distension. There is no tenderness.  Musculoskeletal:  2+ edema bilateral legs   Neurological: She is alert and oriented to person, place, and time. No cranial nerve deficit. Coordination normal.  Skin: Skin is warm.  Psychiatric: She has a normal mood and affect.  Nursing note and vitals reviewed.    ED Treatments / Results  Labs (all labs ordered are listed, but only abnormal results are displayed) Labs Reviewed  BASIC METABOLIC PANEL    CBC  HEPATIC FUNCTION PANEL  BRAIN NATRIURETIC PEPTIDE  PROTIME-INR  PROTIME-INR  I-STAT TROPONIN, ED    EKG EKG Interpretation  Date/Time:  Tuesday March 29 2018 16:00:16 EDT Ventricular Rate:  137 PR Interval:    QRS Duration: 96 QT Interval:  336 QTC Calculation: 507 R Axis:   -27 Text Interpretation:  Atrial fibrillation with rapid ventricular response with premature ventricular or aberrantly conducted complexes Nonspecific ST abnormality Abnormal ECG rate faster than previous Confirmed by Wandra Arthurs [phone]) on 03/29/2018 4:10:45 PM   Radiology Dg Chest Port 1 View  Result Date: 03/29/2018 CLINICAL DATA:  Pt sent from dr's office for new onset afib and BLE edema. Pt denies chest pain, but endorses, SOB, and dizziness. hx of CHF, COPD, CAD, irregular heartbeat. EXAM: PORTABLE CHEST 1 VIEW COMPARISON:  09/21/2017 FINDINGS: The patient is rotated towards the RIGHT. There are bilateral pleural effusions. Significant bronchitic changes are stable and probably chronic. There has been an increase in perihilar and bibasilar opacity, consistent with edema. No consolidations. IMPRESSION: Increased bilateral pleural effusions, perihilar and basilar opacities consistent with edema superimposed on chronic lung disease. Electronically Signed   By: Nolon Nations M.D.   On: 03/29/2018 16:34    Procedures Procedures (including critical care time)  CRITICAL CARE Performed by: Wandra Arthurs   Total critical care time: 30 minutes  Critical care time was exclusive of separately billable procedures and treating other patients.  Critical care was necessary to treat or prevent imminent or life-threatening deterioration.  Critical care was time spent personally by me on the following activities: development of treatment plan with patient and/or surrogate as well as nursing, discussions with consultants, evaluation of patient's response to treatment, examination of patient, obtaining history  from patient or surrogate, ordering and performing treatments and interventions, ordering and review of laboratory studies, ordering and review of radiographic studies, pulse oximetry and re-evaluation of patient's condition.   Medications Ordered in ED Medications  diltiazem (CARDIZEM) injection 10 mg (has no administration in time range)     Initial Impression / Assessment and Plan / ED Course  I have reviewed the triage vital signs and the nursing notes.  Pertinent labs & imaging results that were available during my care of the patient were reviewed by me and considered in my medical decision making (see chart for details).     SHAVAUGHN SEIDL is a 84 y.o. female here with SOB, leg swelling, rapid afib. She has new onset afib. CHADVASC is 5 so she needs to be on anticoagulation. I am concerned for CHF exacerbation vs COPD exacerbation vs PE. Will get labs, BNP, CXR, CT angio chest. Will give cardizem for rapid afib and reassess.   7:38 PM BNP 600. CXR showed pulmonary edema. CTA showed no PE, just CHF. Given cardizem 10 mg IV bolus. HR down to 90s temporarily and went back up to 120s. Started cardizem drip. Also given lasix 40 mg IV for CHF exacerbation. Will admit for rapid afib, CHF exacerbation.      Final Clinical Impressions(s) / ED Diagnoses   Final diagnoses:  None    ED Discharge Orders    None       Shirlyn Goltz  Hsienta, MD 03/29/18 1940

## 2018-03-30 DIAGNOSIS — R1310 Dysphagia, unspecified: Secondary | ICD-10-CM

## 2018-03-30 DIAGNOSIS — I4891 Unspecified atrial fibrillation: Secondary | ICD-10-CM | POA: Diagnosis present

## 2018-03-30 LAB — CBC WITH DIFFERENTIAL/PLATELET
Abs Immature Granulocytes: 0 10*3/uL (ref 0.0–0.1)
BASOS ABS: 0 10*3/uL (ref 0.0–0.1)
Basophils Relative: 1 %
EOS ABS: 0.1 10*3/uL (ref 0.0–0.7)
EOS PCT: 1 %
HCT: 37.6 % (ref 36.0–46.0)
HEMOGLOBIN: 12 g/dL (ref 12.0–15.0)
IMMATURE GRANULOCYTES: 0 %
LYMPHS ABS: 1.3 10*3/uL (ref 0.7–4.0)
LYMPHS PCT: 22 %
MCH: 31.5 pg (ref 26.0–34.0)
MCHC: 31.9 g/dL (ref 30.0–36.0)
MCV: 98.7 fL (ref 78.0–100.0)
Monocytes Absolute: 0.7 10*3/uL (ref 0.1–1.0)
Monocytes Relative: 11 %
NEUTROS PCT: 65 %
Neutro Abs: 4 10*3/uL (ref 1.7–7.7)
Platelets: 177 10*3/uL (ref 150–400)
RBC: 3.81 MIL/uL — AB (ref 3.87–5.11)
RDW: 14.6 % (ref 11.5–15.5)
WBC: 6.1 10*3/uL (ref 4.0–10.5)

## 2018-03-30 LAB — BASIC METABOLIC PANEL
ANION GAP: 11 (ref 5–15)
BUN: 14 mg/dL (ref 8–23)
CO2: 33 mmol/L — ABNORMAL HIGH (ref 22–32)
Calcium: 8.8 mg/dL — ABNORMAL LOW (ref 8.9–10.3)
Chloride: 96 mmol/L — ABNORMAL LOW (ref 98–111)
Creatinine, Ser: 0.84 mg/dL (ref 0.44–1.00)
GFR calc Af Amer: >60 mL/min (ref >60)
GFR, EST NON AFRICAN AMERICAN: 60 mL/min — AB (ref >60)
GLUCOSE: 97 mg/dL (ref 70–99)
POTASSIUM: 3.6 mmol/L (ref 3.5–5.1)
Sodium: 140 mmol/L (ref 135–145)

## 2018-03-30 LAB — URINALYSIS, ROUTINE W REFLEX MICROSCOPIC
Bilirubin Urine: NEGATIVE
GLUCOSE, UA: NEGATIVE mg/dL
HGB URINE DIPSTICK: NEGATIVE
Ketones, ur: NEGATIVE mg/dL
LEUKOCYTES UA: NEGATIVE
Nitrite: NEGATIVE
PH: 9 — AB (ref 5.0–8.0)
Protein, ur: NEGATIVE mg/dL
SPECIFIC GRAVITY, URINE: 1.01 (ref 1.005–1.030)

## 2018-03-30 LAB — MAGNESIUM: Magnesium: 2.1 mg/dL (ref 1.7–2.4)

## 2018-03-30 LAB — TROPONIN I: Troponin I: <0.03 ng/mL (ref <0.03)

## 2018-03-30 LAB — TSH: TSH: 10.531 u[IU]/mL — ABNORMAL HIGH (ref 0.350–4.500)

## 2018-03-30 LAB — T4, FREE: Free T4: 1.14 ng/dL (ref 0.82–1.77)

## 2018-03-30 LAB — MRSA PCR SCREENING: MRSA BY PCR: NEGATIVE

## 2018-03-30 MED ORDER — ENSURE ENLIVE PO LIQD
237.0000 mL | Freq: Two times a day (BID) | ORAL | Status: DC
  Administered 2018-03-30 – 2018-04-06 (×6): 237 mL via ORAL

## 2018-03-30 MED ORDER — ENSURE ENLIVE PO LIQD: 237.0000 mL | Freq: Three times a day (TID) | ORAL | Status: DC

## 2018-03-30 MED ORDER — DILTIAZEM HCL ER COATED BEADS 180 MG PO CP24
180.0000 mg | ORAL_CAPSULE | Freq: Every day | ORAL | Status: DC
  Administered 2018-03-30 – 2018-04-01 (×3): 180 mg via ORAL
  Filled 2018-03-30 (×3): qty 1

## 2018-03-30 MED ORDER — DILTIAZEM HCL-DEXTROSE 100-5 MG/100ML-% IV SOLN (PREMIX)
5.0000 mg/h | INTRAVENOUS | Status: DC
  Filled 2018-03-30: qty 100

## 2018-03-30 MED ORDER — APIXABAN 2.5 MG PO TABS: 2.5000 mg | ORAL_TABLET | Freq: Two times a day (BID) | ORAL | Status: DC

## 2018-03-30 MED ORDER — APIXABAN 2.5 MG PO TABS
2.5000 mg | ORAL_TABLET | Freq: Two times a day (BID) | ORAL | Status: DC
  Administered 2018-03-30 – 2018-04-06 (×14): 2.5 mg via ORAL
  Filled 2018-03-30 (×15): qty 1

## 2018-03-30 NOTE — Plan of Care (Signed)
Afib on monitor. Cardizem gtt at 7.5 mg per hour. Lovenox.

## 2018-03-30 NOTE — Progress Notes (Addendum)
Initial Nutrition Assessment  DOCUMENTATION CODES:   Severe malnutrition in context of chronic illness, Underweight  INTERVENTION:    Ensure Enlive po BID, each supplement provides 350 kcal and 20 grams of protein  Magic cup TID with meals, each supplement provides 290 kcal and 9 grams of protein  NUTRITION DIAGNOSIS:   Severe Malnutrition related to chronic illness(COPD, CHF) as evidenced by severe fat depletion, severe muscle depletion, percent weight loss(22% weight loss within 6 months).  GOAL:   Patient will meet greater than or equal to 90% of their needs  MONITOR:   PO intake, Supplement acceptance  REASON FOR ASSESSMENT:   Malnutrition Screening Tool    ASSESSMENT:   84 yo female with PMH of HLD, fibromyalgia, CHF, CAD, COPD, osteoporosis, fish/shellfish allergy, and peripheral neuropathy who was admitted on 8/27 with CHF exacerbation.   Spoke with patient and her husband regarding recent weight loss. She endorses loss of appetite several months ago with resultant decreased intake and weight loss. She has been eating better and drinks Ensure supplements between her meals.   Labs reviewed. Medications reviewed and include Lasix, folic acid, vitamin I-50.  Per review of weight encounters, patient has lost 22% of usual weight within the past 6 months.  I/O -1.2 L since admission with diuresis  NUTRITION - FOCUSED PHYSICAL EXAM:    Most Recent Value  Orbital Region  Severe depletion  Upper Arm Region  Severe depletion  Thoracic and Lumbar Region  Moderate depletion  Buccal Region  Moderate depletion  Temple Region  Severe depletion  Clavicle Bone Region  Severe depletion  Clavicle and Acromion Bone Region  Severe depletion  Scapular Bone Region  Severe depletion  Dorsal Hand  Severe depletion  Patellar Region  Severe depletion  Anterior Thigh Region  Severe depletion  Posterior Calf Region  Unable to assess  Edema (RD Assessment)  Moderate  Hair   Reviewed  Eyes  Reviewed  Mouth  Reviewed  Skin  Reviewed  Nails  Reviewed       Diet Order:   Diet Order            DIET SOFT Room service appropriate? Yes; Fluid consistency: Thin  Diet effective now              EDUCATION NEEDS:   No education needs have been identified at this time  Skin:  Skin Assessment: Reviewed RN Assessment  Last BM:  unknown  Height:   Ht Readings from Last 1 Encounters:  03/29/18 5\' 7"  (1.702 m)    Weight:   Wt Readings from Last 1 Encounters:  03/29/18 45.4 kg    Ideal Body Weight:  61.4 kg  BMI:  Body mass index is 15.66 kg/m.  Estimated Nutritional Needs:   Kcal:  1400-1600  Protein:  70-80 gm  Fluid:  1.4 L    Molli Barrows, RD, LDN, [place] Pager [phone] After Hours Pager [phone]

## 2018-03-30 NOTE — Consult Note (Signed)
Cardiology Consult Note  Admit date: 03/29/2018 Name: [name] 84 y.o.  female DOB:  26-Jun-1927 MRN:  [id number]  Today's date:  03/30/2018  Referring Physician:    [facility]  Primary Physician:    Dr. [name]  Reason for Consultation:    Atrial fibrillation and congestive heart failure  IMPRESSIONS: 1.  Atrial fibrillation with rapid ventricular response now controlled on IV diltiazem-this is a new diagnosis for her and has not previously been appreciated likely responsible for recent exacerbation of heart failure 2.  Acute on chronic exacerbation of diastolic heart failure 3.  Cachexia 4.  Chronic bronchiectasis 5.  Chronic pain syndrome 6.  Hypertension 7.  Chronic abdominal pain and symptoms  RECOMMENDATION: Her CHA2DS2VASC score is 5 and she would benefit from anticoagulation.  She is febrile, cachectic and will likely need to be on a reduced dose.  I cannot elicit any definite history of bleeding except maybe some occasional hemoptysis when she has an exacerbation of her bronchiectasis.  I would switch her over to oral therapy to control her rate is her rate is well controlled now.  Continue diuresis.  Check echocardiogram.  I would initially try rate control and see how she does with that although she may benefit to get her put back into sinus rhythm.  This likely would require amiodarone and she would need to be anticoagulated at least a month before we can do that.  HISTORY: This 84 year old female is well-known to me.  She has a history of chronic bronchiectasis a chronic pain syndrome and has multiple complaints referable to every organ system for many years.  She is currently on her second marriage first husband having been a patient of mine and died a few years ago.  She has chronic complaints of chest discomfort and abdominal pain back pain and headache and generally aches all over.  She is currently being treated for peripheral neuropathy.  She has a  history of chronic diastolic heart failure.  She is seen infrequently by me maybe once or twice a year.  She has not had a recent echocardiogram.  She presented to her primary care doctor with a 3 to four-week history of progressive swelling of her lower extremities.  She took some Lasix that she had on hand but has continued to complain of swelling and shortness of breath.  She has chronic shortness of breath due to her bronchiectasis what as well as cough and pneumonia so evaluation of her complaints are very difficult.  She was recently evaluated for epidural steroid injections is also recently seen a gastroenterologist for evaluation of multiple complaints and been chronic and present for years.  She presented to the emergency room on referral from the primary care doctor's office with increasing shortness of breath and was found to be in atrial fibrillation.  She has been started on IV diltiazem and has been given intravenous Lasix and is currently saying that she has felt better recently.  As usual complaints of chest pain and shortness of breath and swelling abdominal pain and a host of other complaints.  Past Medical History:  Diagnosis Date  . Anxiety   . Arthritis   . CHF (congestive heart failure) ([place])   . Complication of anesthesia    " I have to have a spinal beacuse my lungs & heart are bad "  . COPD (chronic obstructive pulmonary disease) ([place])   . Coronary artery disease   . Depression   . Fibromyalgia   .  Hiatal hernia   . Hyperlipidemia   . Irregular heartbeat   . On home oxygen therapy    "2L at night and prn" (03/29/2018)  . Osteoporosis   . Peripheral neuropathy 08/30/2017  . Rotator cuff tear    RIGHT  . UTI (urinary tract infection) 09/2014      Past Surgical History:  Procedure Laterality Date  . APPENDECTOMY    . CATARACT EXTRACTION Left   . DILATION AND CURETTAGE OF UTERUS     x2  . ELBOW SURGERY     Right  . HIP ARTHROPLASTY Right 09/22/2014   Procedure:  ARTHROPLASTY BIPOLAR HIP;  Surgeon: [name], MD;  Location: [facility];  Service: Orthopedics;  Laterality: Right;  . TONSILLECTOMY    . TOTAL HIP REVISION Right 10/13/2014   Procedure: REVISION RIGHT TOTAL HIP POSTERIOR ;  Surgeon: Rod Can, MD;  Location: [facility];  Service: Orthopedics;  Laterality: Right;    Allergies:  is allergic to shrimp [shellfish allergy]; fish allergy; adhesive [tape]; and other.   Medications: Prior to Admission medications   Medication Sig Start Date End Date Taking? Authorizing Provider  acetaminophen (TYLENOL) 500 MG tablet Take 2 tablets (1,000 mg total) by mouth every 8 (eight) hours. Patient taking differently: Take 1,000 mg by mouth every 8 (eight) hours as needed for mild pain or headache.  10/15/14  Yes Swinteck, Aaron Edelman, MD  albuterol (PROVENTIL HFA;VENTOLIN HFA) 108 (90 Base) MCG/ACT inhaler Inhale 2 puffs into the lungs every 6 (six) hours as needed. Patient taking differently: Inhale 2 puffs into the lungs every 6 (six) hours as needed for wheezing or shortness of breath.  01/21/17  Yes [name], Ronie Spies, MD  Astaxanthin 4 MG CAPS Take 4 mg by mouth daily.    Yes [provider]  Bilberry 150 MG CAPS Take 150 mg by mouth daily.    Yes [provider]  budesonide-formoterol (SYMBICORT) 80-4.5 MCG/ACT inhaler Inhale 2 puffs into the lungs 2 (two) times daily. Patient taking differently: Inhale 2 puffs into the lungs 2 (two) times daily as needed (for "flares").  09/21/17  Yes Tanda Rockers, MD  clorazepate (TRANXENE) 7.5 MG tablet Take 3.75 mg by mouth 2 (two) times daily as needed (for tightness in the stomach).    Yes [provider]  folic acid (FOLVITE) 332 MCG tablet Take 400 mcg by mouth daily.   Yes [provider]  furosemide (LASIX) 20 MG tablet TAKE ONE TABLET BY MOUTH ONCE DAILY 03/19/16  Yes Mannam, Praveen, MD  HYDROcodone-homatropine (HYCODAN) 5-1.5 MG/5ML syrup Take 5 mLs by mouth every 8 (eight) hours  as needed for cough. 02/14/16  Yes Mannam, Praveen, MD  hydrocortisone (ANUSOL-HC) 25 MG suppository Place 25 mg rectally as needed for hemorrhoids.    Yes [provider]  hydroxypropyl methylcellulose (ISOPTO TEARS) 2.5 % ophthalmic solution Place 1 drop into both eyes 3 (three) times daily as needed for dry eyes.    Yes [provider]  levothyroxine (SYNTHROID, LEVOTHROID) 75 MCG tablet Take 75 mcg by mouth daily.     Yes [provider]  magnesium hydroxide (MILK OF MAGNESIA) 400 MG/5ML suspension Take 5-15 mLs by mouth daily as needed for mild constipation.    Yes [provider]  Melatonin 10 MG TABS Take 10 mg by mouth at bedtime as needed (for sleep).    Yes [provider]  Multiple Vitamins-Minerals (ICAPS AREDS 2) CAPS Take 1 capsule by mouth 2 (two) times daily.  Yes [provider]  nitroGLYCERIN (NITROSTAT) 0.4 MG SL tablet Place 0.4 mg under the tongue every 5 (five) minutes as needed for chest pain.    Yes [provider]  ondansetron (ZOFRAN) 4 MG tablet Take 1 tablet (4 mg total) by mouth every 8 (eight) hours as needed for nausea or vomiting. 01/20/18  Yes Zehr, Laban Emperor, PA-C  OXYGEN Inhale 2 L into the lungs See admin instructions. 2 liters at bedtime and may also use 2 liters during times of exertion as needed for shortness of breath   Yes [provider]  Papaya CHEW Chew 2-3 tablets by mouth as needed (for nausea).   Yes [provider]  Polyethyl Glycol-Propyl Glycol (SYSTANE) 0.4-0.3 % SOLN Place 1 drop into both eyes at bedtime as needed (for dry eyes).    Yes [provider]  sodium chloride HYPERTONIC 3 % nebulizer solution Take by nebulization 2 (two) times daily. Patient taking differently: Take 4 mLs by nebulization 2 (two) times daily as needed (for Bronchiectasis-related symptoms).  01/21/17  Yes Juanito Doom, MD  trimethoprim-polymyxin b (POLYTRIM) ophthalmic solution  Place 1 drop into the left eye See admin instructions. Instill 1 drop into the left eye three times a day for the 1st day and 1 drop four times a day on the 2nd day AFTER INJECTION 03/14/18  Yes [provider]  vitamin B-12 (CYANOCOBALAMIN) 1000 MCG tablet Take 1,000 mcg by mouth daily.   Yes [provider]  Cholecalciferol (VITAMIN D3) 1000 UNITS CAPS Take 1,000 Units by mouth daily.     [provider]  gabapentin (NEURONTIN) 100 MG capsule One capsule twice a day and 3 at night Patient not taking: Reported on 03/29/2018 09/22/17   Kathrynn Ducking, MD  Probiotic CAPS Take 1 capsule by mouth daily.    [provider]  Valerian 500 MG CAPS Take 500 mg by mouth daily.    [provider]  vitamin C (ASCORBIC ACID) 500 MG tablet Take 500 mg by mouth daily.    [provider]    Family History: Family Status  Relation Name Status  . Father  Deceased  . Mother  Deceased  . Brother  (Not Specified)  . Brother  (Not Specified)  . Brother  (Not Specified)  . Sister  (Not Specified)  . Son  (Not Specified)  . Son  (Not Specified)  . Son  (Not Specified)    Social History:   reports that she has never smoked. She has never used smokeless tobacco. She reports that she does not drink alcohol or use drugs.   Social History   Social History Narrative   Lives with husand   Caffeine use: 1.5 cups coffee per day and pepsi   Right handed     Review of Systems: She is cachectic and has chronically been underweight for years.  She complains of multiple allergies to seasonal products and substances.  She has poor vision and poor hearing.  She has chronic headaches.  She has chronic neck and back pain and significant lumbar disc disease.  She has significant bronchiectasis and is followed by the pulmonary care doctors and is on home oxygen.  She has chronic chest pain which is atypical for angina.  She has not felt in the past be a candidate for  aggressive cardiac intervention.  She has chronic abdominal complaints with diarrhea constipation as well as abdominal pain she has chronic urinary complaints with some incontinence  and dysuria frequency as well as nocturia.  She has multiple arthritic complaints.  Other than as noted above remainder of the review of systems is unremarkable.  Physical Exam: BP 123/67 (BP Location: Left Arm)   Pulse 82   Temp 98.3 F (36.8 C) (Oral)   Resp (!) 23   Ht 5\' 7"  (1.702 m)   Wt 45.4 kg   SpO2 98%   BMI 15.66 kg/m   General appearance: she is a cachectic appearing elderly white female lying in bed with her eyes closed Head: Normocephalic, without obvious abnormality, atraumatic Eyes: conjunctivae/corneas clear. PERRL, EOM's intact. Fundi benign. Neck: no adenopathy, no carotid bruit, no JVD and supple, symmetrical, trachea midline Lungs: increased AP diameter, reduced breath sounds occasional rhonchi Heart: irregular rhythm, normal S1 and S2, no S3 heard 1 to 2/6 systolic murmur at mitral area Abdomen: soft, non-tender; bowel sounds normal; no masses,  no organomegaly Pelvic: deferred Extremities: changes of chronic venous stasis noted, mild 1-2+ edema noted Pulses: 2+ and symmetric Skin: Skin color, texture, turgor normal. No rashes or lesions Neurologic: Grossly normal Psych: Alert and oriented x 3 Labs: CBC Recent Labs    03/30/18 0804  WBC 6.1  RBC 3.81*  HGB 12.0  HCT 37.6  PLT 177  MCV 98.7  MCH 31.5  MCHC 31.9  RDW 14.6  LYMPHSABS 1.3  MONOABS 0.7  EOSABS 0.1  BASOSABS 0.0   CMP  Recent Labs    03/29/18 1612 03/30/18 0155  NA  --  140  K  --  3.6  CL  --  96*  CO2  --  33*  GLUCOSE  --  97  BUN  --  14  CREATININE  --  0.84  CALCIUM  --  8.8*  PROT 7.7  --   ALBUMIN 3.7  --   AST 37  --   ALT 28  --   ALKPHOS 84  --   BILITOT 1.2  --   GFRNONAA  --  60*  GFRAA  --  >60   BNP (last 3 results) BNP    Component Value Date/Time   BNP 604.9 (H)  03/29/2018 1612   Cardiac Panel (last 3 results) Troponin (Point of Care Test) Recent Labs    03/29/18 1619  TROPIPOC 0.01   Cardiac Panel (last 3 results) Recent Labs    03/29/18 2050 03/30/18 0155 03/30/18 0804  TROPONINI <0.03 <0.03 <0.03     Radiology:  Bilateral pleural effusions, lung edema versus chronic lung changes  EKG: atrial fibrillation with rapid ventricular response, and nonspecific ST changes Independently reviewed by me  Signed:  W. Doristine Church MD [facility]   Cardiology Consultant  03/30/2018, 2:53 PM

## 2018-03-30 NOTE — Evaluation (Signed)
Physical Therapy Evaluation Patient Details Name: [name] MRN: [id number] DOB: 11/11/1926 Today's Date: 03/30/2018   History of Present Illness  [name] is an 84 year old Caucasian female, with past medical history significant for diastolic CHF, CAD, COPD, pernicious anemia, anxiety, and depression.  Patient was admitted with shortness of breath, orthopnea, dyspnea on exertion, significant leg edema, chronic cough and atrial fibrillation with rapid ventricular response    Clinical Impression  Pt admitted with above diagnosis. Pt currently with functional limitations due to the deficits listed below (see PT Problem List). PTA, pt living with husband at home mod I with mobility and ADLs using SPC and RW. Upon eval pt weaker than  Baseline, ambulating in room with RW, HR resting 90's, max 115 this session. SpO2 90-95% on RA. Min guard for OOB mobility at this time.   Pt will benefit from skilled PT to increase their independence and safety with mobility to allow discharge to the venue listed below.       Follow Up Recommendations Home health PT;Supervision/Assistance - 24 hour    Equipment Recommendations  None recommended by PT    Recommendations for Other Services       Precautions / Restrictions Precautions Precautions: Fall      Mobility  Bed Mobility Overal bed mobility: Modified Independent             General bed mobility comments: increased time and effort   Transfers Overall transfer level: Needs assistance Equipment used: Rolling walker (2 wheeled) Transfers: Sit to/from Stand Sit to Stand: Min guard            Ambulation/Gait Ambulation/Gait assistance: Min guard Gait Distance (Feet): 30 Feet Assistive device: Rolling walker (2 wheeled) Gait Pattern/deviations: Step-to pattern;Step-through pattern Gait velocity: decreased   General Gait Details: pt with unsteady gait, weaker than baseline. HR max 115  Stairs             Wheelchair Mobility    Modified Rankin (Stroke Patients Only)       Balance Overall balance assessment: Needs assistance Sitting-balance support: No upper extremity supported Sitting balance-Leahy Scale: Good       Standing balance-Leahy Scale: Fair                               Pertinent Vitals/Pain Pain Assessment: Faces Faces Pain Scale: Hurts little more Pain Location: L shoulder into arm Pain Descriptors / Indicators: Aching Pain Intervention(s): Limited activity within patient's tolerance;Monitored during session    [place] expects to be discharged to:: Private residence Living Arrangements: Spouse/significant other Available Help at Discharge: Family;Available 24 hours/day Type of Home: House Home Access: Level entry     Home Layout: One level Home Equipment: Walker - 2 wheels;Walker - 4 wheels;Cane - single point      Prior Function Level of Independence: Independent with assistive device(s)         Comments: Limited community ambulation with RW, SPC around home     Hand Dominance        Extremity/Trunk Assessment   Upper Extremity Assessment Upper Extremity Assessment: Generalized weakness    Lower Extremity Assessment Lower Extremity Assessment: Generalized weakness(RLE 3+/5 LLE 4/5)       Communication      Cognition Arousal/Alertness: Awake/alert  General Comments      Exercises     Assessment/Plan    PT Assessment Patient needs continued PT services  PT Problem List Decreased strength;Decreased range of motion;Decreased balance;Decreased activity tolerance;Decreased mobility;Decreased coordination       PT Treatment Interventions DME instruction;Gait training;Stair training;Functional mobility training;Therapeutic activities;Therapeutic exercise;Balance training    PT Goals (Current goals can be found in the Care Plan section)   Acute Rehab PT Goals Patient Stated Goal: go home when ready PT Goal Formulation: With patient/family Time For Goal Achievement: 04/13/18 Potential to Achieve Goals: Good    Frequency Min 3X/week   Barriers to discharge        Co-evaluation               AM-PAC PT "6 Clicks" Daily Activity  Outcome Measure Difficulty turning over in bed (including adjusting bedclothes, sheets and blankets)?: A Little Difficulty moving from lying on back to sitting on the side of the bed? : A Little Difficulty sitting down on and standing up from a chair with arms (e.g., wheelchair, bedside commode, etc,.)?: A Little Help needed moving to and from a bed to chair (including a wheelchair)?: A Little Help needed walking in hospital room?: A Little Help needed climbing 3-5 steps with a railing? : A Little 6 Click Score: 18    End of Session Equipment Utilized During Treatment: Gait belt Activity Tolerance: Patient limited by fatigue Patient left: in chair;with call bell/phone within reach Nurse Communication: Mobility status PT Visit Diagnosis: Unsteadiness on feet (R26.81);Other abnormalities of gait and mobility (R26.89);Muscle weakness (generalized) (M62.81)    Time: 5053-9767 PT Time Calculation (min) (ACUTE ONLY): 29 min   Charges:   PT Evaluation $PT Eval Moderate Complexity: 1 Mod PT Treatments $Gait Training: 8-22 mins        Reinaldo Berber, PT, DPT Acute Rehab Services Pager: [phone]    Reinaldo Berber 03/30/2018, 6:18 PM

## 2018-03-30 NOTE — Progress Notes (Signed)
PROGRESS NOTE    [name]  MEQ:[id number] DOB: 1927-04-11 DOA: 03/29/2018 PCP: [name], MD  Outpatient Specialists:     Brief Narrative:  [name] is an 84 year old Caucasian female, with past medical history significant for diastolic CHF, CAD, COPD, pernicious anemia, anxiety, and depression.  Patient was admitted with shortness of breath, orthopnea, dyspnea on exertion, significant leg edema, chronic cough and atrial fibrillation with rapid ventricular response.  Patient has also had significant weight loss.  Patient is followed by Dr. Tollie Eth of cardiology.  She reports having a echocardiogram done few months ago.  She even had work-up for stomach issues with a CT scan of abdomen and pelvis on 7/3, that showed no acute abnormalities.  On presentation to the hospital, the patient's heart rate was 134 bpm, in atrial fibrillation.  BNP was 604.9, troponin was 0.01.  CT chest revealed evidence of congestive heart failure and bilateral pleural effusion with edema.  03/30/2017: Patient is on Cardizem drip.  Patient is also being diuresed.  The leg edema has improved significantly according to the patient and her husband.  Shortness of breath is improving.  Heart rate is down to the 70s, but still in atrial fibrillation.  Patient is also currently anticoagulated with subcutaneous Lovenox 45 mg twice daily (1 mg/kg every 12 hours).  Patient was not on anticoagulation prior to admission according to the patient and her husband.  Assessment & Plan:   Principal Problem:   Diastolic CHF ([place]) Active Problems:   Bronchiectasis without acute exacerbation (HCC)   Hypothyroidism   Abnormal weight loss   Atrial fibrillation with RVR (HCC)   Dysphagia   Acute on chronic diastolic congestive heart failure:  Patient presents with shortness of breath and leg swelling.  Found to have enlarged heart with pulmonary edema on imaging.  BNP elevated at 600.  Patient was given 40 mg  of Lasix IV.  Last EF noted to be 65 to 70% with grade 2 diastolic dysfunction in 29/7989.  Patient currently being followed in outpatient setting by Dr. Tollie Eth of cardiology. - Admit to a telemetry bed - Heart failure orders set  initiated  - Continuous pulse oximetry with nasal cannula oxygen as needed to keep O2 saturations >92% - Strict I&Os and daily weights - Elevate lower extremities - Lasix 40 mg IV Bid - Reassess in a.m. and adjust diuresis as needed. - May want to call in regards to recently performed echocardiogram by Dr. Wynonia Lawman - Optimize medical management as able - Message sent for cardiology to evaluate in a.m. 03/30/2018: Continue IV Lasix for now.  Cardiologist, Dr. Tollie Eth, consulted.  Will transition Cardizem drip to oral, but will defer this to the cardiologist, Dr. Wynonia Lawman.  Patient is currently on subcutaneous Lovenox treatment dose.  Cardiology to advise on anticoagulation for atrial fibrillation.  A. fib with RVR: CHA2DS2-VASc score = to at least 4:  - Continue diltiazem drip - Trend troponins - Correct electrolytes as needed - Lovenox per pharmacy - Determine risk/benefits of anticoagulation 03/30/2018: Kindly see above.  Currently, the patient's heart rate is controlled.  Bronchiectasis without acute COPD exacerbation: Chronic: - Nasal cannula oxygen 2 L as needed - Continue pharmacy substitution of Dulera for Symbicort - Duonebs as needed  Hypothyroidism: - Check TSH - Continue Levothyroxine  Dysphagia:  Patient reports having difficulty swallowing pills.  Patient declines being evaluated by speech therapy. - Soft diet with Ensure supplements  Weight loss:   Patient reports at  least 10 to 20 pound weight loss over the last 6 months.  Unclear cause of symptoms at this time.  Recent CT imaging of the abdomen and pelvis from 7/30 showed no acute abnormalities.  Albumin appears within normal limits.  DVT prophylaxis: Subcutaneous Lovenox 1  mg/kg every 12 Code Status: Full Family Communication: Husband Disposition Plan: Home eventually   Consultants:  Dr. Tollie Eth, cardiology.    Procedures:   None  Antimicrobials:   None   Subjective: Shortness of breath has improved Dyspnea on exertion is improving Leg edema is improving  Objective: Vitals:   03/29/18 2340 03/30/18 0511 03/30/18 0753 03/30/18 1259  BP: 128/90 122/70 117/60 123/67  Pulse: (!) 101  70 82  Resp: 19  (!) 25 (!) 23  Temp:  (!) 97.5 F (36.4 C) 98.2 F (36.8 C) 98.3 F (36.8 C)  TempSrc:   Oral Oral  SpO2: 96%  98% 95%  Weight:      Height:        Intake/Output Summary (Last 24 hours) at 03/30/2018 1317 Last data filed at 03/30/2018 0900 Gross per 24 hour  Intake 358.75 ml  Output 1625 ml  Net -1266.25 ml   Filed Weights   03/29/18 1601  Weight: 45.4 kg    Examination:  General exam: Cachectic.  Appears calm and comfortable  Respiratory system: Clear to auscultation.  Cardiovascular system: S1 & S2 heard, irregularly irregular. Gastrointestinal system: Abdomen is nondistended, soft and nontender. No organomegaly or masses felt. Normal bowel sounds heard. Central nervous system: Alert and oriented. No focal neurological deficits. Extremities: 1+ to 2 to bilateral lower leg edema.   Psychiatry: Judgement and insight appear normal. Mood & affect appropriate.     Data Reviewed: I have personally reviewed following labs and imaging studies  CBC: Recent Labs  Lab 03/29/18 1611 03/30/18 0804  WBC 8.3 6.1  NEUTROABS  --  4.0  HGB 12.2 12.0  HCT 39.6 37.6  MCV 101.0* 98.7  PLT 195 220   Basic Metabolic Panel: Recent Labs  Lab 03/29/18 1611 03/30/18 0155  NA 138 140  K 4.3 3.6  CL 96* 96*  CO2 28 33*  GLUCOSE 128* 97  BUN 15 14  CREATININE 0.79 0.84  CALCIUM 9.0 8.8*  MG  --  2.1   GFR: Estimated Creatinine Clearance: 32.5 mL/min (by C-G formula based on SCr of 0.84 mg/dL). Liver Function Tests: Recent  Labs  Lab 03/29/18 1612  AST 37  ALT 28  ALKPHOS 84  BILITOT 1.2  PROT 7.7  ALBUMIN 3.7   No results for input(s): LIPASE, AMYLASE in the last 168 hours. No results for input(s): AMMONIA in the last 168 hours. Coagulation Profile: Recent Labs  Lab 03/29/18 1652  INR 1.12   Cardiac Enzymes: Recent Labs  Lab 03/29/18 2050 03/30/18 0155 03/30/18 0804  TROPONINI <0.03 <0.03 <0.03   BNP (last 3 results) No results for input(s): PROBNP in the last 8760 hours. HbA1C: No results for input(s): HGBA1C in the last 72 hours. CBG: No results for input(s): GLUCAP in the last 168 hours. Lipid Profile: No results for input(s): CHOL, HDL, LDLCALC, TRIG, CHOLHDL, LDLDIRECT in the last 72 hours. Thyroid Function Tests: Recent Labs    03/30/18 0155  TSH 10.531*   Anemia Panel: No results for input(s): VITAMINB12, FOLATE, FERRITIN, TIBC, IRON, RETICCTPCT in the last 72 hours. Urine analysis:    Component Value Date/Time   COLORURINE COLORLESS (A) 03/29/2018 0020   APPEARANCEUR CLEAR 03/29/2018  0020   LABSPEC 1.010 03/29/2018 0020   PHURINE 9.0 (H) 03/29/2018 0020   GLUCOSEU NEGATIVE 03/29/2018 0020   HGBUR NEGATIVE 03/29/2018 0020   BILIRUBINUR NEGATIVE 03/29/2018 0020   KETONESUR NEGATIVE 03/29/2018 0020   PROTEINUR NEGATIVE 03/29/2018 0020   UROBILINOGEN 0.2 09/21/2014 1325   NITRITE NEGATIVE 03/29/2018 0020   LEUKOCYTESUR NEGATIVE 03/29/2018 0020   Sepsis Labs: @LABRCNTIP (procalcitonin:4,lacticidven:4)  ) Recent Results (from the past 240 hour(s))  MRSA PCR Screening     Status: None   Collection Time: 03/30/18  2:22 AM  Result Value Ref Range Status   MRSA by PCR NEGATIVE NEGATIVE Final    Comment:        The GeneXpert MRSA Assay (FDA approved for NASAL specimens only), is one component of a comprehensive MRSA colonization surveillance program. It is not intended to diagnose MRSA infection nor to guide or monitor treatment for MRSA infections. Performed at  [facility], [place] [address] [place], [place] [address]          Radiology Studies: Ct Soft Tissue Neck W Contrast  Result Date: 03/29/2018 CLINICAL DATA:  Shortness of breath and dizziness, assess etiology within the neck. New onset atrial fibrillation. EXAM: CT NECK WITH CONTRAST TECHNIQUE: Multidetector CT imaging of the neck was performed using the standard protocol following the bolus administration of intravenous contrast. CONTRAST:  159mL ISOVUE-370 IOPAMIDOL (ISOVUE-370) INJECTION 76% COMPARISON:  None. FINDINGS: PHARYNX AND LARYNX: Normal.  Widely patent airway. SALIVARY GLANDS: Normal. THYROID: Normal. LYMPH NODES: No lymphadenopathy by CT size criteria. VASCULAR: Moderate calcific atherosclerosis carotid bifurcations. Calcific atherosclerosis aortic arch. Prominent posterior paraspinal veins, non-specific. LIMITED INTRACRANIAL: Normal. VISUALIZED ORBITS: Status post bilateral ocular lens implants. MASTOIDS AND VISUALIZED PARANASAL SINUSES: Small RIGHT maxillary mucosal retention cyst without paranasal sinus air-fluid levels. SKELETON: Nonacute. Exaggerated cervical lordosis. Moderate to severe mid cervical facet arthropathy. Severe temporomandibular osteoarthrosis. Torus palatini. Torus mandibularis. Scattered dental caries and periapical lucencies. Osteopenia. UPPER CHEST: Please see dedicated CT chest from same day, reported separately. OTHER: None. IMPRESSION: 1. No acute process in the neck.  Patent airway. Aortic Atherosclerosis (ICD10-I70.0). Electronically Signed   By: Elon Alas M.D.   On: 03/29/2018 19:19   Ct Angio Chest Pe W And/or Wo Contrast  Result Date: 03/29/2018 CLINICAL DATA:  New onset atrial fibrillation and lower extremity swelling. EXAM: CT ANGIOGRAPHY CHEST WITH CONTRAST TECHNIQUE: Multidetector CT imaging of the chest was performed using the standard protocol during bolus administration of intravenous contrast. Multiplanar CT image reconstructions and  MIPs were obtained to evaluate the vascular anatomy. CONTRAST:  164mL ISOVUE-370 IOPAMIDOL (ISOVUE-370) INJECTION 76% COMPARISON:  Chest x-ray from earlier in the same day, MRI from 09/14/2017 FINDINGS: Cardiovascular: Thoracic aorta and its branches demonstrate atherosclerotic change without evidence of aneurysmal dilatation. Mild mitral calcifications are seen. Coronary calcifications are noted as well. The pulmonary artery is well visualize with a normal branching pattern. No filling defects are seen to suggest pulmonary embolism. Mediastinum/Nodes: Thoracic inlet is within normal limits. No sizable mediastinal adenopathy is noted. The esophagus is within normal limits. Lungs/Pleura: Lungs are hyperinflated consistent with COPD. There are changes of bronchiectasis particularly in the lingula and right middle lobe. Some loculated fluid is noted adjacent to the inferior aspect of the right upper lobe and right middle lobe. Some mild changes of pulmonary edema are seen. Mild atelectatic changes are noted in the bases bilaterally although no focal confluent infiltrate is seen. Large bilateral pleural effusions are noted right greater than left. No  sizable parenchymal nodules are seen. Upper Abdomen: Visualized upper abdomen is within normal limits. Musculoskeletal: Osseous structures again demonstrate a the lytic and somewhat expansile lesion in the posterior aspect of T 11 extending into the left pedicle consistent with atypical hemangioma stable from prior MRI examination from 09/14/2017 as well as a prior study from 2013. degenerative changes are noted as well as scattered other hemangiomas. Review of the MIP images confirms the above findings. IMPRESSION: No evidence of pulmonary emboli. Changes of CHF with bilateral pleural effusions and pulmonary edema. Chronic changes of bronchiectasis and COPD. Chronic changes in T11 consistent with the given clinical history of atypical hemangioma stable from 2013. Aortic  Atherosclerosis (ICD10-I70.0) and Emphysema (ICD10-J43.9). Electronically Signed   By: Inez Catalina M.D.   On: 03/29/2018 19:24   Dg Chest Port 1 View  Result Date: 03/29/2018 CLINICAL DATA:  Pt sent from dr's office for new onset afib and BLE edema. Pt denies chest pain, but endorses, SOB, and dizziness. hx of CHF, COPD, CAD, irregular heartbeat. EXAM: PORTABLE CHEST 1 VIEW COMPARISON:  09/21/2017 FINDINGS: The patient is rotated towards the RIGHT. There are bilateral pleural effusions. Significant bronchitic changes are stable and probably chronic. There has been an increase in perihilar and bibasilar opacity, consistent with edema. No consolidations. IMPRESSION: Increased bilateral pleural effusions, perihilar and basilar opacities consistent with edema superimposed on chronic lung disease. Electronically Signed   By: Nolon Nations M.D.   On: 03/29/2018 16:34        Scheduled Meds: . enoxaparin (LOVENOX) injection  45 mg Subcutaneous Once  . enoxaparin (LOVENOX) injection  45 mg Subcutaneous Q12H  . feeding supplement (ENSURE ENLIVE)  237 mL Oral BID BM  . folic acid  903 mcg Oral Daily  . furosemide  40 mg Intravenous BID  . levothyroxine  75 mcg Oral QAC breakfast  . pantoprazole  40 mg Oral Daily  . sodium chloride flush  3 mL Intravenous Q12H  . vitamin B-12  1,000 mcg Oral Daily   Continuous Infusions: . sodium chloride    . diltiazem (CARDIZEM) infusion 7.5 mg/hr (03/30/18 0225)     LOS: 1 day    Time spent: 35 minutes.    Dana Allan, MD  [facility] Pager #: [phone] 7PM-7AM contact night coverage as above

## 2018-03-31 ENCOUNTER — Inpatient Hospital Stay (HOSPITAL_COMMUNITY): Payer: Medicare Other

## 2018-03-31 DIAGNOSIS — I5032 Chronic diastolic (congestive) heart failure: Secondary | ICD-10-CM

## 2018-03-31 LAB — CBC WITH DIFFERENTIAL/PLATELET
Abs Immature Granulocytes: 0 10*3/uL (ref 0.0–0.1)
Basophils Absolute: 0.1 10*3/uL (ref 0.0–0.1)
Basophils Relative: 1 %
Eosinophils Absolute: 0.1 10*3/uL (ref 0.0–0.7)
Eosinophils Relative: 2 %
HCT: 39.6 % (ref 36.0–46.0)
Hemoglobin: 12.5 g/dL (ref 12.0–15.0)
Immature Granulocytes: 0 %
Lymphocytes Relative: 21 %
Lymphs Abs: 1.5 10*3/uL (ref 0.7–4.0)
MCH: 31.3 pg (ref 26.0–34.0)
MCHC: 31.6 g/dL (ref 30.0–36.0)
MCV: 99.2 fL (ref 78.0–100.0)
Monocytes Absolute: 0.9 10*3/uL (ref 0.1–1.0)
Monocytes Relative: 12 %
Neutro Abs: 4.6 10*3/uL (ref 1.7–7.7)
Neutrophils Relative %: 64 %
Platelets: 183 10*3/uL (ref 150–400)
RBC: 3.99 MIL/uL (ref 3.87–5.11)
RDW: 14.6 % (ref 11.5–15.5)
WBC: 7.1 10*3/uL (ref 4.0–10.5)

## 2018-03-31 LAB — RENAL FUNCTION PANEL
Albumin: 3.4 g/dL — ABNORMAL LOW (ref 3.5–5.0)
Anion gap: 11 (ref 5–15)
BUN: 17 mg/dL (ref 8–23)
CO2: 34 mmol/L — ABNORMAL HIGH (ref 22–32)
Calcium: 8.6 mg/dL — ABNORMAL LOW (ref 8.9–10.3)
Chloride: 94 mmol/L — ABNORMAL LOW (ref 98–111)
Creatinine, Ser: 0.88 mg/dL (ref 0.44–1.00)
GFR calc Af Amer: >60 mL/min (ref >60)
GFR calc non Af Amer: 57 mL/min — ABNORMAL LOW (ref >60)
Glucose, Bld: 101 mg/dL — ABNORMAL HIGH (ref 70–99)
Phosphorus: 4.5 mg/dL (ref 2.5–4.6)
Potassium: 3.2 mmol/L — ABNORMAL LOW (ref 3.5–5.1)
Sodium: 139 mmol/L (ref 135–145)

## 2018-03-31 LAB — URINE CULTURE: Culture: <10000 — AB

## 2018-03-31 LAB — ECHOCARDIOGRAM COMPLETE
HEIGHTINCHES: 67 in
WEIGHTICAEL: 1851.86 [oz_av]

## 2018-03-31 LAB — MAGNESIUM: Magnesium: 2 mg/dL (ref 1.7–2.4)

## 2018-03-31 MED ORDER — ALUM & MAG HYDROXIDE-SIMETH 200-200-20 MG/5ML PO SUSP: 30.0000 mL | ORAL | Status: DC | PRN

## 2018-03-31 MED ORDER — POTASSIUM CHLORIDE CRYS ER 20 MEQ PO TBCR
40.0000 meq | EXTENDED_RELEASE_TABLET | ORAL | Status: AC
  Administered 2018-03-31 (×2): 40 meq via ORAL
  Filled 2018-03-31 (×2): qty 2

## 2018-03-31 NOTE — Progress Notes (Signed)
  Echocardiogram 2D Echocardiogram has been performed.  Ellean Firman L Androw 03/31/2018, 12:23 PM

## 2018-03-31 NOTE — Progress Notes (Signed)
PROGRESS NOTE    [name]  CWC:[id number] DOB: 06/18/1927 DOA: 03/29/2018 PCP: [name], MD  Outpatient Specialists:     Brief Narrative:  [name] is an 84 year old Caucasian female, with past medical history significant for diastolic CHF, CAD, COPD, pernicious anemia, anxiety, and depression.  Patient was admitted with shortness of breath, orthopnea, dyspnea on exertion, significant leg edema, chronic cough and atrial fibrillation with rapid ventricular response.  Patient has also had significant weight loss.  Patient is followed by Dr. Tollie Eth of cardiology.  She reports having a echocardiogram done few months ago.  She even had work-up for stomach issues with a CT scan of abdomen and pelvis on 7/3, that showed no acute abnormalities.  On presentation to the hospital, the patient's heart rate was 134 bpm, in atrial fibrillation.  BNP was 604.9, troponin was 0.01.  CT chest revealed evidence of congestive heart failure and bilateral pleural effusion with edema.  03/30/2017: Patient is on Cardizem drip.  Patient is also being diuresed.  The leg edema has improved significantly according to the patient and her husband.  Shortness of breath is improving.  Heart rate is down to the 70s, but still in atrial fibrillation.  Patient is also currently anticoagulated with subcutaneous Lovenox 45 mg twice daily (1 mg/kg every 12 hours).  Patient was not on anticoagulation prior to admission according to the patient and her husband.  03/31/2018: Cardiology (Dr. Landry Corporal) input is highly appreciated.  Patient has done on Eliquis 2.5 mg p.o. twice daily.  Cardizem has been converted to oral.  Heart rate is controlled.  Leg edema is improving.  Patient is still on IV diuretics.  Cardiology team is assisting in directing patient's care.  Assessment & Plan:   Principal Problem:   Diastolic CHF ([place]) Active Problems:   Bronchiectasis without acute exacerbation (HCC)  Hypothyroidism   Abnormal weight loss   Atrial fibrillation with RVR (HCC)   Dysphagia   Acute on chronic diastolic congestive heart failure:  Patient presents with shortness of breath and leg swelling.  Found to have enlarged heart with pulmonary edema on imaging.  BNP elevated at 600.  Patient was given 40 mg of Lasix IV.  Last EF noted to be 65 to 70% with grade 2 diastolic dysfunction in 76/1607.  Patient currently being followed in outpatient setting by Dr. Tollie Eth of cardiology. - Admit to a telemetry bed - Heart failure orders set  initiated  - Continuous pulse oximetry with nasal cannula oxygen as needed to keep O2 saturations >92% - Strict I&Os and daily weights - Elevate lower extremities - Lasix 40 mg IV Bid - Reassess in a.m. and adjust diuresis as needed. - May want to call in regards to recently performed echocardiogram by Dr. Wynonia Lawman - Optimize medical management as able - Message sent for cardiology to evaluate in a.m. 03/30/2018: Continue IV Lasix for now.  Cardiologist, Dr. Tollie Eth, consulted.  Will transition Cardizem drip to oral, but will defer this to the cardiologist, Dr. Wynonia Lawman.  Patient is currently on subcutaneous Lovenox treatment dose.  Cardiology to advise on anticoagulation for atrial fibrillation. 03/31/2018: Continue IV diuretics.  Continue to monitor volume status.  A. fib with RVR: CHA2DS2-VASc score = to at least 4:  - Continue diltiazem drip - Trend troponins - Correct electrolytes as needed - Lovenox per pharmacy - Determine risk/benefits of anticoagulation 03/30/2018: Kindly see above.  Currently, the patient's heart rate is controlled. 03/31/2018: Is actually new  onset A. fib.  Patient is not on Eliquis.  Rate is controlled.  Possible cardioversion after 4 to 6 weeks of anticoagulation.  Bronchiectasis without acute COPD exacerbation: Chronic: - Nasal cannula oxygen 2 L as needed - Continue pharmacy substitution of Dulera for  Symbicort - Duonebs as needed  Hypothyroidism: - Check TSH - Continue Levothyroxine  Dysphagia:  Patient reports having difficulty swallowing pills.  Patient declines being evaluated by speech therapy. - Soft diet with Ensure supplements  Weight loss:   Patient reports at least 10 to 20 pound weight loss over the last 6 months.  Unclear cause of symptoms at this time.  Recent CT imaging of the abdomen and pelvis from 7/30 showed no acute abnormalities.  Albumin appears within normal limits.  Continue to monitor.  DVT prophylaxis: Subcutaneous Lovenox 1 mg/kg every 12 Code Status: Full Family Communication: Husband Disposition Plan: Home eventually   Consultants:  Dr. Tollie Eth, cardiology.    Procedures:   None  Antimicrobials:   None   Subjective: Shortness of breath has improved Dyspnea on exertion is improving Leg edema is improving  Objective: Vitals:   03/31/18 1248 03/31/18 1530 03/31/18 1600 03/31/18 1611  BP: (!) 115/96   103/62  Pulse: 88   91  Resp: 15 (!) 24 17 17   Temp: 98 F (36.7 C)   98.1 F (36.7 C)  TempSrc: Oral   Oral  SpO2: 96% 95%    Weight:      Height:        Intake/Output Summary (Last 24 hours) at 03/31/2018 1729 Last data filed at 03/31/2018 1530 Gross per 24 hour  Intake 840 ml  Output 2300 ml  Net -1460 ml   Filed Weights   03/29/18 1601 03/31/18 0700  Weight: 45.4 kg 52.5 kg    Examination:  General exam: Cachectic.  Appears calm and comfortable  Respiratory system: Clear to auscultation.  Cardiovascular system: S1 & S2 heard, irregularly irregular. Gastrointestinal system: Abdomen is nondistended, soft and nontender. No organomegaly or masses felt. Normal bowel sounds heard. Central nervous system: Alert and oriented. No focal neurological deficits. Extremities: 1+ bilateral lower leg edema.   Psychiatry: Judgement and insight appear normal. Mood & affect appropriate.     Data Reviewed: I have personally  reviewed following labs and imaging studies  CBC: Recent Labs  Lab 03/29/18 1611 03/30/18 0804 03/31/18 0254  WBC 8.3 6.1 7.1  NEUTROABS  --  4.0 4.6  HGB 12.2 12.0 12.5  HCT 39.6 37.6 39.6  MCV 101.0* 98.7 99.2  PLT 195 177 811   Basic Metabolic Panel: Recent Labs  Lab 03/29/18 1611 03/30/18 0155 03/31/18 0254  NA 138 140 139  K 4.3 3.6 3.2*  CL 96* 96* 94*  CO2 28 33* 34*  GLUCOSE 128* 97 101*  BUN 15 14 17   CREATININE 0.79 0.84 0.88  CALCIUM 9.0 8.8* 8.6*  MG  --  2.1 2.0  PHOS  --   --  4.5   GFR: Estimated Creatinine Clearance: 35.9 mL/min (by C-G formula based on SCr of 0.88 mg/dL). Liver Function Tests: Recent Labs  Lab 03/29/18 1612 03/31/18 0254  AST 37  --   ALT 28  --   ALKPHOS 84  --   BILITOT 1.2  --   PROT 7.7  --   ALBUMIN 3.7 3.4*   No results for input(s): LIPASE, AMYLASE in the last 168 hours. No results for input(s): AMMONIA in the last 168 hours. Coagulation Profile:  Recent Labs  Lab 03/29/18 1652  INR 1.12   Cardiac Enzymes: Recent Labs  Lab 03/29/18 2050 03/30/18 0155 03/30/18 0804  TROPONINI <0.03 <0.03 <0.03   BNP (last 3 results) No results for input(s): PROBNP in the last 8760 hours. HbA1C: No results for input(s): HGBA1C in the last 72 hours. CBG: No results for input(s): GLUCAP in the last 168 hours. Lipid Profile: No results for input(s): CHOL, HDL, LDLCALC, TRIG, CHOLHDL, LDLDIRECT in the last 72 hours. Thyroid Function Tests: Recent Labs    03/30/18 0155  TSH 10.531*  FREET4 1.14   Anemia Panel: No results for input(s): VITAMINB12, FOLATE, FERRITIN, TIBC, IRON, RETICCTPCT in the last 72 hours. Urine analysis:    Component Value Date/Time   COLORURINE COLORLESS (A) 03/29/2018 0020   APPEARANCEUR CLEAR 03/29/2018 0020   LABSPEC 1.010 03/29/2018 0020   PHURINE 9.0 (H) 03/29/2018 0020   GLUCOSEU NEGATIVE 03/29/2018 0020   HGBUR NEGATIVE 03/29/2018 0020   BILIRUBINUR NEGATIVE 03/29/2018 0020   KETONESUR  NEGATIVE 03/29/2018 0020   PROTEINUR NEGATIVE 03/29/2018 0020   UROBILINOGEN 0.2 09/21/2014 1325   NITRITE NEGATIVE 03/29/2018 0020   LEUKOCYTESUR NEGATIVE 03/29/2018 0020   Sepsis Labs: @LABRCNTIP (procalcitonin:4,lacticidven:4)  ) Recent Results (from the past 240 hour(s))  Urine culture     Status: Abnormal   Collection Time: 03/30/18  1:08 AM  Result Value Ref Range Status   Specimen Description URINE, RANDOM  Final   Special Requests NONE  Final   Culture (A)  Final    <10,000 COLONIES/mL INSIGNIFICANT GROWTH Performed at [facility], [place] [address] [place], [place] [address]    Report Status 03/31/2018 FINAL  Final  MRSA PCR Screening     Status: None   Collection Time: 03/30/18  2:22 AM  Result Value Ref Range Status   MRSA by PCR NEGATIVE NEGATIVE Final    Comment:        The GeneXpert MRSA Assay (FDA approved for NASAL specimens only), is one component of a comprehensive MRSA colonization surveillance program. It is not intended to diagnose MRSA infection nor to guide or monitor treatment for MRSA infections. Performed at [facility], [place] [address], [place], [place] [address]          Radiology Studies: Ct Soft Tissue Neck W Contrast  Result Date: 03/29/2018 CLINICAL DATA:  Shortness of breath and dizziness, assess etiology within the neck. New onset atrial fibrillation. EXAM: CT NECK WITH CONTRAST TECHNIQUE: Multidetector CT imaging of the neck was performed using the standard protocol following the bolus administration of intravenous contrast. CONTRAST:  130mL ISOVUE-370 IOPAMIDOL (ISOVUE-370) INJECTION 76% COMPARISON:  None. FINDINGS: PHARYNX AND LARYNX: Normal.  Widely patent airway. SALIVARY GLANDS: Normal. THYROID: Normal. LYMPH NODES: No lymphadenopathy by CT size criteria. VASCULAR: Moderate calcific atherosclerosis carotid bifurcations. Calcific atherosclerosis aortic arch. Prominent posterior paraspinal veins, non-specific. LIMITED  INTRACRANIAL: Normal. VISUALIZED ORBITS: Status post bilateral ocular lens implants. MASTOIDS AND VISUALIZED PARANASAL SINUSES: Small RIGHT maxillary mucosal retention cyst without paranasal sinus air-fluid levels. SKELETON: Nonacute. Exaggerated cervical lordosis. Moderate to severe mid cervical facet arthropathy. Severe temporomandibular osteoarthrosis. Torus palatini. Torus mandibularis. Scattered dental caries and periapical lucencies. Osteopenia. UPPER CHEST: Please see dedicated CT chest from same day, reported separately. OTHER: None. IMPRESSION: 1. No acute process in the neck.  Patent airway. Aortic Atherosclerosis (ICD10-I70.0). Electronically Signed   By: Elon Alas M.D.   On: 03/29/2018 19:19   Ct Angio Chest Pe W And/or Wo Contrast  Result  Date: 03/29/2018 CLINICAL DATA:  New onset atrial fibrillation and lower extremity swelling. EXAM: CT ANGIOGRAPHY CHEST WITH CONTRAST TECHNIQUE: Multidetector CT imaging of the chest was performed using the standard protocol during bolus administration of intravenous contrast. Multiplanar CT image reconstructions and MIPs were obtained to evaluate the vascular anatomy. CONTRAST:  146mL ISOVUE-370 IOPAMIDOL (ISOVUE-370) INJECTION 76% COMPARISON:  Chest x-ray from earlier in the same day, MRI from 09/14/2017 FINDINGS: Cardiovascular: Thoracic aorta and its branches demonstrate atherosclerotic change without evidence of aneurysmal dilatation. Mild mitral calcifications are seen. Coronary calcifications are noted as well. The pulmonary artery is well visualize with a normal branching pattern. No filling defects are seen to suggest pulmonary embolism. Mediastinum/Nodes: Thoracic inlet is within normal limits. No sizable mediastinal adenopathy is noted. The esophagus is within normal limits. Lungs/Pleura: Lungs are hyperinflated consistent with COPD. There are changes of bronchiectasis particularly in the lingula and right middle lobe. Some loculated fluid is  noted adjacent to the inferior aspect of the right upper lobe and right middle lobe. Some mild changes of pulmonary edema are seen. Mild atelectatic changes are noted in the bases bilaterally although no focal confluent infiltrate is seen. Large bilateral pleural effusions are noted right greater than left. No sizable parenchymal nodules are seen. Upper Abdomen: Visualized upper abdomen is within normal limits. Musculoskeletal: Osseous structures again demonstrate a the lytic and somewhat expansile lesion in the posterior aspect of T 11 extending into the left pedicle consistent with atypical hemangioma stable from prior MRI examination from 09/14/2017 as well as a prior study from 2013. degenerative changes are noted as well as scattered other hemangiomas. Review of the MIP images confirms the above findings. IMPRESSION: No evidence of pulmonary emboli. Changes of CHF with bilateral pleural effusions and pulmonary edema. Chronic changes of bronchiectasis and COPD. Chronic changes in T11 consistent with the given clinical history of atypical hemangioma stable from 2013. Aortic Atherosclerosis (ICD10-I70.0) and Emphysema (ICD10-J43.9). Electronically Signed   By: Inez Catalina M.D.   On: 03/29/2018 19:24        Scheduled Meds: . apixaban  2.5 mg Oral BID  . diltiazem  180 mg Oral Daily  . feeding supplement (ENSURE ENLIVE)  237 mL Oral BID BM  . folic acid  546 mcg Oral Daily  . furosemide  40 mg Intravenous BID  . levothyroxine  75 mcg Oral QAC breakfast  . pantoprazole  40 mg Oral Daily  . sodium chloride flush  3 mL Intravenous Q12H  . vitamin B-12  1,000 mcg Oral Daily   Continuous Infusions: . sodium chloride       LOS: 2 days    Time spent: 25 minutes.    Dana Allan, MD  [facility] Pager #: [phone] 7PM-7AM contact night coverage as above

## 2018-03-31 NOTE — Discharge Instructions (Addendum)
Atrial Fibrillation Atrial fibrillation is a type of irregular or rapid heartbeat (arrhythmia). In atrial fibrillation, the heart quivers continuously in a chaotic pattern. This occurs when parts of the heart receive disorganized signals that make the heart unable to pump blood normally. This can increase the risk for stroke, heart failure, and other heart-related conditions. There are different types of atrial fibrillation, including:  Paroxysmal atrial fibrillation. This type starts suddenly, and it usually stops on its own shortly after it starts.  Persistent atrial fibrillation. This type often lasts longer than a week. It may stop on its own or with treatment.  Long-lasting persistent atrial fibrillation. This type lasts longer than 12 months.  Permanent atrial fibrillation. This type does not go away.  Talk with your health care provider to learn about the type of atrial fibrillation that you have. What are the causes? This condition is caused by some heart-related conditions or procedures, including:  A heart attack.  Coronary artery disease.  Heart failure.  Heart valve conditions.  High blood pressure.  Inflammation of the sac that surrounds the heart (pericarditis).  Heart surgery.  Certain heart rhythm disorders, such as Wolf-Parkinson-White syndrome.  Other causes include:  Pneumonia.  Obstructive sleep apnea.  Blockage of an artery in the lungs (pulmonary embolism, or PE).  Lung cancer.  Chronic lung disease.  Thyroid problems, especially if the thyroid is overactive (hyperthyroidism).  Caffeine.  Excessive alcohol use or illegal drug use.  Use of some medicines, including certain decongestants and diet pills.  Sometimes, the cause cannot be found. What increases the risk? This condition is more likely to develop in:  People who are older in age.  People who smoke.  People who have diabetes mellitus.  People who are overweight  (obese).  Athletes who exercise vigorously.  What are the signs or symptoms? Symptoms of this condition include:  A feeling that your heart is beating rapidly or irregularly.  A feeling of discomfort or pain in your chest.  Shortness of breath.  Sudden light-headedness or weakness.  Getting tired easily during exercise.  In some cases, there are no symptoms. How is this diagnosed? Your health care provider may be able to detect atrial fibrillation when taking your pulse. If detected, this condition may be diagnosed with:  An electrocardiogram (ECG).  A Holter monitor test that records your heartbeat patterns over a 24-hour period.  Transthoracic echocardiogram (TTE) to evaluate how blood flows through your heart.  Transesophageal echocardiogram (TEE) to view more detailed images of your heart.  A stress test.  Imaging tests, such as a CT scan or chest X-ray.  Blood tests.  How is this treated? The main goals of treatment are to prevent blood clots from forming and to keep your heart beating at a normal rate and rhythm. The type of treatment that you receive depends on many factors, such as your underlying medical conditions and how you feel when you are experiencing atrial fibrillation. This condition may be treated with:  Medicine to slow down the heart rate, bring the hearts rhythm back to normal, or prevent clots from forming.  Electrical cardioversion. This is a procedure that resets your hearts rhythm by delivering a controlled, low-energy shock to the heart through your skin.  Different types of ablation, such as catheter ablation, catheter ablation with pacemaker, or surgical ablation. These procedures destroy the heart tissues that send abnormal signals. When the pacemaker is used, it is placed under your skin to help your heart beat in  a regular rhythm.  Follow these instructions at home:  Take over-the counter and prescription medicines only as told by your  health care provider.  If your health care provider prescribed a blood-thinning medicine (anticoagulant), take it exactly as told. Taking too much blood-thinning medicine can cause bleeding. If you do not take enough blood-thinning medicine, you will not have the protection that you need against stroke and other problems.  Do not use tobacco products, including cigarettes, chewing tobacco, and e-cigarettes. If you need help quitting, ask your health care provider.  If you have obstructive sleep apnea, manage your condition as told by your health care provider.  Do not drink alcohol.  Do not drink beverages that contain caffeine, such as coffee, soda, and tea.  Maintain a healthy weight. Do not use diet pills unless your health care provider approves. Diet pills may make heart problems worse.  Follow diet instructions as told by your health care provider.  Exercise regularly as told by your health care provider.  Keep all follow-up visits as told by your health care provider. This is important. How is this prevented?  Avoid drinking beverages that contain caffeine or alcohol.  Avoid certain medicines, especially medicines that are used for breathing problems.  Avoid certain herbs and herbal medicines, such as those that contain ephedra or ginseng.  Do not use illegal drugs, such as cocaine and amphetamines.  Do not smoke.  Manage your high blood pressure. Contact a health care provider if:  You notice a change in the rate, rhythm, or strength of your heartbeat.  You are taking an anticoagulant and you notice increased bruising.  You tire more easily when you exercise or exert yourself. Get help right away if:  You have chest pain, abdominal pain, sweating, or weakness.  You feel nauseous.  You notice blood in your vomit, bowel movement, or urine.  You have shortness of breath.  You suddenly have swollen feet and ankles.  You feel dizzy.  You have sudden weakness or  numbness of the face, arm, or leg, especially on one side of the body.  You have trouble speaking, trouble understanding, or both (aphasia).  Your face or your eyelid droops on one side. These symptoms may represent a serious problem that is an emergency. Do not wait to see if the symptoms will go away. Get medical help right away. Call your local emergency services (911 in the [place] Do not drive yourself to the hospital. This information is not intended to replace advice given to you by your health care provider. Make sure you discuss any questions you have with your health care provider. Document Released: 07/20/2005 Document Revised: 11/27/2015 Document Reviewed: 11/14/2014 Elsevier Interactive Patient Education  2018 [place] on my medicine - ELIQUIS (apixaban)  This medication education was reviewed with me or my healthcare representative as part of my discharge preparation.  The pharmacist that spoke with me during my hospital stay was:  [name], [facility]  Why was Eliquis prescribed for you? Eliquis was prescribed for you to reduce the risk of a blood clot forming that can cause a stroke if you have a medical condition called atrial fibrillation (a type of irregular heartbeat).  What do You need to know about Eliquis ? Take your Eliquis TWICE DAILY - one tablet in the morning and one tablet in the evening with or without food. If you have difficulty swallowing the tablet whole please discuss with your pharmacist how to take  the medication safely.  Take Eliquis exactly as prescribed by your doctor and DO NOT stop taking Eliquis without talking to the doctor who prescribed the medication.  Stopping may increase your risk of developing a stroke.  Refill your prescription before you run out.  After discharge, you should have regular check-up appointments with your healthcare provider that is prescribing your Eliquis.  In the future your dose may need to be  changed if your kidney function or weight changes by a significant amount or as you get older.  What do you do if you miss a dose? If you miss a dose, take it as soon as you remember on the same day and resume taking twice daily.  Do not take more than one dose of ELIQUIS at the same time to make up a missed dose.  Important Safety Information A possible side effect of Eliquis is bleeding. You should call your healthcare provider right away if you experience any of the following: ? Bleeding from an injury or your nose that does not stop. ? Unusual colored urine (red or dark brown) or unusual colored stools (red or black). ? Unusual bruising for unknown reasons. ? A serious fall or if you hit your head (even if there is no bleeding).  Some medicines may interact with Eliquis and might increase your risk of bleeding or clotting while on Eliquis. To help avoid this, consult your healthcare provider or pharmacist prior to using any new prescription or non-prescription medications, including herbals, vitamins, non-steroidal anti-inflammatory drugs (NSAIDs) and supplements.  This website has more information on Eliquis (apixaban): http://www.eliquis.com/eliquis/home

## 2018-03-31 NOTE — Progress Notes (Signed)
Physical Therapy Treatment Patient Details Name: [name] MRN: [id number] DOB: [birth year] Today's Date: 03/31/2018    History of Present Illness [name] is an 84 year old Caucasian female, with past medical history significant for diastolic CHF, CAD, COPD, pernicious anemia, anxiety, and depression.  Patient was admitted with shortness of breath, orthopnea, dyspnea on exertion, significant leg edema, chronic cough and atrial fibrillation with rapid ventricular response    PT Comments    Patient with mild progression from yesterday, HR better controled with activity, HRmax 105. SpO2 on RA 88-95% this session. Increased ambulation distance without rest break. Cues again for safe transfers with RW, reinforce next PT visit. Min guard level for all OOB mobility at present.       Follow Up Recommendations  Home health PT;Supervision/Assistance - 24 hour     Equipment Recommendations  None recommended by PT    Recommendations for Other Services       Precautions / Restrictions Precautions Precautions: Fall    Mobility  Bed Mobility Overal bed mobility: Modified Independent             General bed mobility comments: increased time and effort   Transfers Overall transfer level: Needs assistance Equipment used: Rolling walker (2 wheeled) Transfers: Sit to/from Stand Sit to Stand: Min guard         General transfer comment: cues for hand placement off bed as she pulls RW.   Ambulation/Gait Ambulation/Gait assistance: Min guard Gait Distance (Feet): 30 Feet Assistive device: Rolling walker (2 wheeled) Gait Pattern/deviations: Step-to pattern;Step-through pattern Gait velocity: decreased   General Gait Details: pt increased distance out into hallway and back into room without rest break coampared to yesterday, HR 105 max    Stairs             Wheelchair Mobility    Modified Rankin (Stroke Patients Only)       Balance Overall balance  assessment: Needs assistance Sitting-balance support: No upper extremity supported Sitting balance-Leahy Scale: Good       Standing balance-Leahy Scale: Fair                              Cognition Arousal/Alertness: Awake/alert Behavior During Therapy: WFL for tasks assessed/performed                                          Exercises      General Comments        Pertinent Vitals/Pain Pain Assessment: Faces Faces Pain Scale: Hurts a little bit Pain Location: L shoulder into arm Pain Descriptors / Indicators: Aching Pain Intervention(s): Limited activity within patient's tolerance;Premedicated before session;Monitored during session    Home Living                      Prior Function            PT Goals (current goals can now be found in the care plan section) Acute Rehab PT Goals Patient Stated Goal: go home when ready PT Goal Formulation: With patient/family Time For Goal Achievement: 04/13/18 Potential to Achieve Goals: Good Progress towards PT goals: Progressing toward goals    Frequency    Min 3X/week      PT Plan Current plan remains appropriate    Co-evaluation  AM-PAC PT "6 Clicks" Daily Activity  Outcome Measure  Difficulty turning over in bed (including adjusting bedclothes, sheets and blankets)?: A Little Difficulty moving from lying on back to sitting on the side of the bed? : A Little Difficulty sitting down on and standing up from a chair with arms (e.g., wheelchair, bedside commode, etc,.)?: A Little Help needed moving to and from a bed to chair (including a wheelchair)?: A Little Help needed walking in hospital room?: A Little Help needed climbing 3-5 steps with a railing? : A Little 6 Click Score: 18    End of Session Equipment Utilized During Treatment: Gait belt Activity Tolerance: Patient limited by fatigue Patient left: in bed;with bed alarm set Nurse Communication:  Mobility status PT Visit Diagnosis: Unsteadiness on feet (R26.81);Other abnormalities of gait and mobility (R26.89);Muscle weakness (generalized) (M62.81)     Time: 5916-3846 PT Time Calculation (min) (ACUTE ONLY): 25 min  Charges:  $Gait Training: 8-22 mins $Therapeutic Activity: 8-22 mins           Reinaldo Berber, PT, DPT Acute Rehab Services Pager: [phone]     Reinaldo Berber 03/31/2018, 10:49 AM

## 2018-03-31 NOTE — Progress Notes (Signed)
Subjective:  Complained of abdominal pain and abdominal symptoms after taking dose of Eliquis last night.  Shortness of breath is better.  Weight is all over the place and hard to know what to make of the weights that are on the chart.  Objective:  Vital Signs in the last 24 hours: BP 122/79 (BP Location: Left Arm)   Pulse 89   Temp 98.1 F (36.7 C) (Oral)   Resp 18   Ht 5\' 7"  (1.702 m)   Wt 52.5 kg   SpO2 99%   BMI 18.13 kg/m   Physical Exam: Cachectic elderly white female currently in no acute distress Lungs:  Clear  Cardiac:  Irregular rhythm, normal S1 and S2, no S3, 1 to 2/6 systolic murmur Abdomen:  Soft, nontender, no masses Extremities:  1+ edema present  Intake/Output from previous day: 08/28 0701 - 08/29 0700 In: 824.3 [P.O.:720; I.V.:104.3] Out: 3775 [Urine:3775] Weight Filed Weights   03/29/18 1601 03/31/18 0700  Weight: 45.4 kg 52.5 kg    Lab Results: Basic Metabolic Panel: Recent Labs    03/30/18 0155 03/31/18 0254  NA 140 139  K 3.6 3.2*  CL 96* 94*  CO2 33* 34*  GLUCOSE 97 101*  BUN 14 17  CREATININE 0.84 0.88    CBC: Recent Labs    03/30/18 0804 03/31/18 0254  WBC 6.1 7.1  NEUTROABS 4.0 4.6  HGB 12.0 12.5  HCT 37.6 39.6  MCV 98.7 99.2  PLT 177 183    BNP    Component Value Date/Time   BNP 604.9 (H) 03/29/2018 1612    Cardiac Panel (last 3 results) Recent Labs    03/29/18 2050 03/30/18 0155 03/30/18 0804  TROPONINI <0.03 <0.03 <0.03   Telemetry: Atrial fibrillation with occasional PVCs versus aberrant beats.  Rate reasonable control  Assessment/Plan:  1.  Acute on chronic diastolic heart failure clinically better diuresing well 2.  Hypokalemia likely due to diuretics 3.  New onset of atrial fibrillation rate now controlled 4.  Abdominal symptoms difficult to assess because she has had similar symptoms in the past  Recommendations:  I would go ahead and continue Eliquis at this time.  Her review of systems is  generally diffusely positive making clinical evaluation difficult.  Await results of echocardiogram today.  Replete potassium.  Continue diuresis.     Kerry Hough  MD [facility]  03/31/2018, 9:28 AM

## 2018-03-31 NOTE — Care Management (Signed)
Per Rena W/[employer]BS co-pay amount for Eliquis 2.5 mg.twice a day is $37.00.  No PA required  Pharmacy : [facility]  PH# BC[employer]: [phone]  [facility]

## 2018-04-01 LAB — BASIC METABOLIC PANEL
ANION GAP: 5 (ref 5–15)
BUN: 19 mg/dL (ref 8–23)
CHLORIDE: 96 mmol/L — AB (ref 98–111)
CO2: 38 mmol/L — ABNORMAL HIGH (ref 22–32)
Calcium: 8.9 mg/dL (ref 8.9–10.3)
Creatinine, Ser: 1.02 mg/dL — ABNORMAL HIGH (ref 0.44–1.00)
GFR calc Af Amer: 55 mL/min — ABNORMAL LOW (ref >60)
GFR, EST NON AFRICAN AMERICAN: 47 mL/min — AB (ref >60)
GLUCOSE: 111 mg/dL — AB (ref 70–99)
POTASSIUM: 3.8 mmol/L (ref 3.5–5.1)
Sodium: 139 mmol/L (ref 135–145)

## 2018-04-01 LAB — CBC
HEMATOCRIT: 38.8 % (ref 36.0–46.0)
HEMOGLOBIN: 12.2 g/dL (ref 12.0–15.0)
MCH: 30.9 pg (ref 26.0–34.0)
MCHC: 31.4 g/dL (ref 30.0–36.0)
MCV: 98.2 fL (ref 78.0–100.0)
Platelets: 178 10*3/uL (ref 150–400)
RBC: 3.95 MIL/uL (ref 3.87–5.11)
RDW: 14.3 % (ref 11.5–15.5)
WBC: 7.7 10*3/uL (ref 4.0–10.5)

## 2018-04-01 MED ORDER — FUROSEMIDE 40 MG PO TABS
40.0000 mg | ORAL_TABLET | Freq: Two times a day (BID) | ORAL | Status: DC
  Administered 2018-04-01 – 2018-04-03 (×4): 40 mg via ORAL
  Filled 2018-04-01 (×4): qty 1

## 2018-04-01 MED ORDER — MOMETASONE FURO-FORMOTEROL FUM 100-5 MCG/ACT IN AERO
2.0000 | INHALATION_SPRAY | Freq: Two times a day (BID) | RESPIRATORY_TRACT | Status: DC
  Administered 2018-04-01 – 2018-04-06 (×8): 2 via RESPIRATORY_TRACT
  Filled 2018-04-01: qty 8.8

## 2018-04-01 MED ORDER — MAGNESIUM HYDROXIDE 400 MG/5ML PO SUSP
5.0000 mL | Freq: Every day | ORAL | Status: DC | PRN
  Administered 2018-04-05: 15 mL via ORAL
  Filled 2018-04-01: qty 30

## 2018-04-01 MED ORDER — LEVALBUTEROL HCL 0.63 MG/3ML IN NEBU
0.6300 mg | INHALATION_SOLUTION | RESPIRATORY_TRACT | Status: DC | PRN
  Administered 2018-04-01: 0.63 mg via RESPIRATORY_TRACT
  Filled 2018-04-01: qty 3

## 2018-04-01 NOTE — Progress Notes (Signed)
Physical Therapy Treatment Patient Details Name: [name] MRN: [id number] DOB: 02-09-1927 Today's Date: 04/01/2018    History of Present Illness [name] is an 84 year old Caucasian female, with past medical history significant for diastolic CHF, CAD, COPD, pernicious anemia, anxiety, and depression.  Patient was admitted with shortness of breath, orthopnea, dyspnea on exertion, significant leg edema, chronic cough and atrial fibrillation with rapid ventricular response    PT Comments    Pt continues to be limited secondary to fatigue and requesting to ambulate within the room. Pt requiring min to min guard A for mobility and oxygen sats ranging from 88%-95% on RA. Educated about RLE HEP to help with R hip pain. Pt reports she feels week and is nervous about going home, however, when educated about SNF, pt adamantly refusing and stating "you just pray for me when I leave." Will continue to follow acutely to maximize functional mobility independence and safety.   Follow Up Recommendations  Home health PT;Supervision/Assistance - 24 hour(pt refusing SNF )     Equipment Recommendations  None recommended by PT    Recommendations for Other Services       Precautions / Restrictions Precautions Precautions: Fall Restrictions Weight Bearing Restrictions: No    Mobility  Bed Mobility Overal bed mobility: Modified Independent             General bed mobility comments: Pt reporting some dizziness which improved with seated rest. Oxygen sats at 95% on RA.   Transfers Overall transfer level: Needs assistance Equipment used: Rolling walker (2 wheeled) Transfers: Sit to/from Stand Sit to Stand: Min assist         General transfer comment: Cues for hand placement. Min A for steadying.   Ambulation/Gait Ambulation/Gait assistance: Min guard Gait Distance (Feet): 30 Feet Assistive device: Rolling walker (2 wheeled) Gait Pattern/deviations: Step-through  pattern;Trunk flexed;Decreased stride length Gait velocity: decreased   General Gait Details: Slow, guarded gait. Pt requesting to turn around at the door. Oxygen sats decreasing to 88% on RA, however, increased back to 90% with seated rest.    Stairs             Wheelchair Mobility    Modified Rankin (Stroke Patients Only)       Balance Overall balance assessment: Needs assistance Sitting-balance support: No upper extremity supported Sitting balance-Leahy Scale: Good     Standing balance support: Bilateral upper extremity supported Standing balance-Leahy Scale: Poor Standing balance comment: Reliant on BUE support                             Cognition Arousal/Alertness: Awake/alert Behavior During Therapy: WFL for tasks assessed/performed Overall Cognitive Status: Within Functional Limits for tasks assessed                                        Exercises Other Exercises Other Exercises: Edcuated about performing heel slides on RLE to help with hip pain.     General Comments General comments (skin integrity, edema, etc.): Pt reporting feeling increased weakness and reports concerns about going home. However, when discussing SNF, pt adamantly refusing and stating "you just pray for me when I leave."       Pertinent Vitals/Pain Pain Assessment: Faces Faces Pain Scale: Hurts a little bit Pain Location: R hip  Pain Descriptors / Indicators: Aching Pain Intervention(s): Limited  activity within patient's tolerance;Monitored during session;Repositioned    Home Living                      Prior Function            PT Goals (current goals can now be found in the care plan section) Acute Rehab PT Goals Patient Stated Goal: go home when ready PT Goal Formulation: With patient/family Time For Goal Achievement: 04/13/18 Potential to Achieve Goals: Good Progress towards PT goals: Progressing toward goals    Frequency     Min 3X/week      PT Plan Current plan remains appropriate    Co-evaluation              AM-PAC PT "6 Clicks" Daily Activity  Outcome Measure  Difficulty turning over in bed (including adjusting bedclothes, sheets and blankets)?: A Little Difficulty moving from lying on back to sitting on the side of the bed? : A Little Difficulty sitting down on and standing up from a chair with arms (e.g., wheelchair, bedside commode, etc,.)?: Unable Help needed moving to and from a bed to chair (including a wheelchair)?: A Little Help needed walking in hospital room?: A Little Help needed climbing 3-5 steps with a railing? : A Lot 6 Click Score: 15    End of Session Equipment Utilized During Treatment: Gait belt Activity Tolerance: Patient limited by fatigue Patient left: in bed;with bed alarm set;with family/visitor present Nurse Communication: Mobility status PT Visit Diagnosis: Unsteadiness on feet (R26.81);Other abnormalities of gait and mobility (R26.89);Muscle weakness (generalized) (M62.81)     Time: 3664-4034 PT Time Calculation (min) (ACUTE ONLY): 20 min  Charges:  $Gait Training: 8-22 mins                     Leighton Ruff, PT, DPT  Acute Rehabilitation Services  Pager: [phone]    [name] 04/01/2018, 3:28 PM

## 2018-04-01 NOTE — Care Management Important Message (Signed)
Important Message  Patient Details  Name: [name] MRN: [id number] Date of Birth: 03/18/1927   Medicare Important Message Given:  Yes    Barb Merino [place] 04/01/2018, 4:34 PM

## 2018-04-01 NOTE — Progress Notes (Signed)
Subjective:  C/o back pain and continued abdominal pain that have been chronic c/o.  Weight stable and edema improved.    Objective:  Vital Signs in the last 24 hours: BP (!) 143/69 (BP Location: Left Arm)   Pulse 93   Temp 97.6 F (36.4 C) (Oral)   Resp (!) 22   Ht 5\' 7"  (1.702 m)   Wt 51.8 kg   SpO2 99%   BMI 17.89 kg/m   Physical Exam: Cachectic elderly white female currently in no acute distress Lungs:  Clear  Cardiac:  Irregular rhythm, normal S1 and S2, no S3, 1 to 2/6 systolic murmur Abdomen:  Soft, nontender, no masses Extremities:  no edema present, chronic venous changes noted  Intake/Output from previous day: 08/29 0701 - 08/30 0700 In: 843 [P.O.:840; I.V.:3] Out: 1400 [Urine:1400] Weight Filed Weights   03/29/18 1601 03/31/18 0700 04/01/18 0639  Weight: 45.4 kg 52.5 kg 51.8 kg    Lab Results: Basic Metabolic Panel: Recent Labs    03/31/18 0254 04/01/18 0349  NA 139 139  K 3.2* 3.8  CL 94* 96*  CO2 34* 38*  GLUCOSE 101* 111*  BUN 17 19  CREATININE 0.88 1.02*    CBC: Recent Labs    03/30/18 0804 03/31/18 0254 04/01/18 0349  WBC 6.1 7.1 7.7  NEUTROABS 4.0 4.6  --   HGB 12.0 12.5 12.2  HCT 37.6 39.6 38.8  MCV 98.7 99.2 98.2  PLT 177 183 178    BNP    Component Value Date/Time   BNP 604.9 (H) 03/29/2018 1612    Cardiac Panel (last 3 results) Recent Labs    03/29/18 2050 03/30/18 0155 03/30/18 0804  TROPONINI <0.03 <0.03 <0.03   Telemetry: Atrial fibrillation with occasional PVCs versus aberrant beats.  Rate reasonable control  Assessment/Plan:  1.  Acute on chronic diastolic heart failure clinically better diuresing well and prob now at dry weight 2.  Hypokalemia improved 3.  New onset of atrial fibrillation rate now controlled 4.  Abdominal symptoms difficult to assess because she has had similar symptoms in the past, tends to have mutiple c/o over the years making clinical care difficult  Recommendations:  Prob close to  dry weight.  I would change over to oral diuretics.  Her EF is now around 45-50% that may reflect recent rapid a fib.  I think could go home soon with home health followup on furosemide.  Will recheck EF down the road once a fib rate controlled.   Kerry Hough  MD [facility] [place] [facility]  04/01/2018, 8:55 AM

## 2018-04-01 NOTE — Care Management Note (Signed)
Case Management Note  Patient Details  Name: [name] MRN: [id number] Date of Birth: Dec 16, 1926  Subjective/Objective:  Pt presented for CHF- has DME 02 at home. PTA from home with the support of husband. PT recommendations for [facility] PT. Plan for home with [facility] RN/PT Services. Patient has used [facility] in the past and wants to utilize again.                   Action/Plan: Referral made to [facility] with [facility]. Pt will need HH RN/PT order and F2F. SOC to begin within 24-48 hours post transition home. Pt declines portable 02  tank for travel home. Patient asked if [facility] can assess to see if patient will qualify for smaller tank- question addressed with Linna Hoff to follow up with. Patient has transportation home. 30 day free Eliquis card provided along with cost. [facility] [place] has medication available. No further needs from CM at this time.   Expected Discharge Date:                  Expected Discharge Plan:  [place]  In-House Referral:  NA  Discharge planning Services  CM Consult  Post Acute Care Choice:  Home Health Choice offered to:  Patient  DME Arranged:  N/A DME Agency:  NA  HH Arranged:  RN, Disease Management, PT [place] Agency:  [place]  Status of Service:  Completed, signed off  If discussed at [place] of Stay Meetings, dates discussed:    Additional Comments:  Bethena Roys, RN 04/01/2018, 11:51 AM

## 2018-04-01 NOTE — Progress Notes (Signed)
[name] TEAM 1 - Stepdown/ICU TEAM  [name]  WGY:[id number] DOB: 10-20-1926 DOA: 03/29/2018 PCP: Melinda Bowen, MD    Brief Narrative:  84yo F w/ a hx of diastolic CHF, CAD, COPD, pernicious anemia, anxiety, anddepression who was admitted with shortness of breath and leg edema, and found to be in atrial fibrillation with RVR at 134bpm. CT chest revealed evidence of congestive heart failure and bilateral pleural effusion with edema.  Subjective: The patient is sitting up at the side of her bed.  She denies chest pain nausea vomiting or abdominal pain.  She tells me she is anxious to go home.  Assessment & Plan:  Acute on chronic diastolic congestive heart failure Has diuresed well and now appears to be at dry weight - transition to oral Lasix - anticipate discharge home in a.m.  A. fib with RVR CHA2DS2-VASc at least 4 - care as per Cardiology   Chronic Bronchiectasis without acute COPD exacerbation Clinically stable at this time   Hypothyroidism continue Levothyroxine  Dysphagia reports having difficulty swallowing pills but declines being evaluated by speech therapy  Weight loss Patient reported at least 10-20 pound weight loss over the last 6 months.  CT abdomen and pelvis 7/30 showed no acute abnormalities.  Albumin appears within normal limits.  DVT prophylaxis: eliquis Code Status: FULL CODE Family Communication: no family present at time of exam  Disposition Plan:   Consultants:  Cardiology   Antimicrobials:  none  Objective: Blood pressure 118/83, pulse 83, temperature (!) 97.5 F (36.4 C), temperature source Oral, resp. rate 19, height 5\' 7"  (1.702 m), weight 51.8 kg, SpO2 95 %.  Intake/Output Summary (Last 24 hours) at 04/01/2018 1805 Last data filed at 04/01/2018 1300 Gross per 24 hour  Intake 609 ml  Output 1300 ml  Net -691 ml   Filed Weights   03/29/18 1601 03/31/18 0700 04/01/18 0639  Weight: 45.4 kg 52.5 kg 51.8 kg     Examination: General: No acute respiratory distress Lungs: Clear to auscultation bilaterally without wheezes or crackles Cardiovascular: Regular rate without murmur gallop or rub normal S1 and S2 Abdomen: Nontender, nondistended, soft, bowel sounds positive, no rebound, no ascites, no appreciable mass Extremities: No significant cyanosis, clubbing, or edema bilateral lower extremities  CBC: Recent Labs  Lab 03/30/18 0804 03/31/18 0254 04/01/18 0349  WBC 6.1 7.1 7.7  NEUTROABS 4.0 4.6  --   HGB 12.0 12.5 12.2  HCT 37.6 39.6 38.8  MCV 98.7 99.2 98.2  PLT 177 183 779   Basic Metabolic Panel: Recent Labs  Lab 03/30/18 0155 03/31/18 0254 04/01/18 0349  NA 140 139 139  K 3.6 3.2* 3.8  CL 96* 94* 96*  CO2 33* 34* 38*  GLUCOSE 97 101* 111*  BUN 14 17 19   CREATININE 0.84 0.88 1.02*  CALCIUM 8.8* 8.6* 8.9  MG 2.1 2.0  --   PHOS  --  4.5  --    GFR: Estimated Creatinine Clearance: 30.6 mL/min (A) (by C-G formula based on SCr of 1.02 mg/dL (H)).  Liver Function Tests: Recent Labs  Lab 03/29/18 1612 03/31/18 0254  AST 37  --   ALT 28  --   ALKPHOS 84  --   BILITOT 1.2  --   PROT 7.7  --   ALBUMIN 3.7 3.4*    Coagulation Profile: Recent Labs  Lab 03/29/18 1652  INR 1.12    Cardiac Enzymes: Recent Labs  Lab 03/29/18 2050 03/30/18 0155 03/30/18 0804  TROPONINI <0.03 <0.03 <  0.03    Recent Results (from the past 240 hour(s))  Urine culture     Status: Abnormal   Collection Time: 03/30/18  1:08 AM  Result Value Ref Range Status   Specimen Description URINE, RANDOM  Final   Special Requests NONE  Final   Culture (A)  Final    <10,000 COLONIES/mL INSIGNIFICANT GROWTH Performed at [facility], [address], [place], [place] [address]    Report Status 03/31/2018 FINAL  Final  MRSA PCR Screening     Status: None   Collection Time: 03/30/18  2:22 AM  Result Value Ref Range Status   MRSA by PCR NEGATIVE NEGATIVE Final    Comment:        The  GeneXpert MRSA Assay (FDA approved for NASAL specimens only), is one component of a comprehensive MRSA colonization surveillance program. It is not intended to diagnose MRSA infection nor to guide or monitor treatment for MRSA infections. Performed at [facility], [place] [address] [place], [place] [address]      Scheduled Meds: . apixaban  2.5 mg Oral BID  . diltiazem  180 mg Oral Daily  . feeding supplement (ENSURE ENLIVE)  237 mL Oral BID BM  . folic acid  301 mcg Oral Daily  . furosemide  40 mg Intravenous BID  . levothyroxine  75 mcg Oral QAC breakfast  . pantoprazole  40 mg Oral Daily  . sodium chloride flush  3 mL Intravenous Q12H  . vitamin B-12  1,000 mcg Oral Daily     LOS: 3 days   Cherene Altes, MD [facility] Office  [phone] Pager - Text Page per Amion  If 7PM-7AM, please contact night-coverage per Amion 04/01/2018, 6:05 PM

## 2018-04-02 DIAGNOSIS — I5033 Acute on chronic diastolic (congestive) heart failure: Secondary | ICD-10-CM

## 2018-04-02 DIAGNOSIS — I4891 Unspecified atrial fibrillation: Secondary | ICD-10-CM

## 2018-04-02 LAB — BASIC METABOLIC PANEL
Anion gap: 10 (ref 5–15)
BUN: 19 mg/dL (ref 8–23)
CHLORIDE: 91 mmol/L — AB (ref 98–111)
CO2: 38 mmol/L — ABNORMAL HIGH (ref 22–32)
Calcium: 9.3 mg/dL (ref 8.9–10.3)
Creatinine, Ser: 1.05 mg/dL — ABNORMAL HIGH (ref 0.44–1.00)
GFR calc Af Amer: 53 mL/min — ABNORMAL LOW (ref >60)
GFR calc non Af Amer: 46 mL/min — ABNORMAL LOW (ref >60)
GLUCOSE: 120 mg/dL — AB (ref 70–99)
POTASSIUM: 3.6 mmol/L (ref 3.5–5.1)
Sodium: 139 mmol/L (ref 135–145)

## 2018-04-02 LAB — CBC
HEMATOCRIT: 44.4 % (ref 36.0–46.0)
Hemoglobin: 14.1 g/dL (ref 12.0–15.0)
MCH: 31.2 pg (ref 26.0–34.0)
MCHC: 31.8 g/dL (ref 30.0–36.0)
MCV: 98.2 fL (ref 78.0–100.0)
Platelets: 197 10*3/uL (ref 150–400)
RBC: 4.52 MIL/uL (ref 3.87–5.11)
RDW: 14 % (ref 11.5–15.5)
WBC: 10 10*3/uL (ref 4.0–10.5)

## 2018-04-02 MED ORDER — DILTIAZEM HCL ER COATED BEADS 180 MG PO CP24
360.0000 mg | ORAL_CAPSULE | Freq: Every day | ORAL | Status: DC
  Filled 2018-04-02: qty 2

## 2018-04-02 MED ORDER — DILTIAZEM HCL ER COATED BEADS 240 MG PO CP24
240.0000 mg | ORAL_CAPSULE | Freq: Every day | ORAL | Status: DC
  Administered 2018-04-02 – 2018-04-03 (×2): 240 mg via ORAL
  Filled 2018-04-02 (×2): qty 1

## 2018-04-02 MED ORDER — ONDANSETRON HCL 4 MG/2ML IJ SOLN
4.0000 mg | INTRAMUSCULAR | Status: DC | PRN
  Administered 2018-04-02 – 2018-04-06 (×5): 4 mg via INTRAVENOUS
  Filled 2018-04-02 (×4): qty 2

## 2018-04-02 NOTE — Plan of Care (Signed)

## 2018-04-02 NOTE — Progress Notes (Signed)
[place] TEAM 1 - Stepdown/ICU TEAM  [name]  TGY:[id number] DOB: 1927-05-15 DOA: 03/29/2018 PCP: [name], MD    Brief Narrative:  84yo F w/ a hx of diastolic CHF, CAD, COPD, pernicious anemia, anxiety, anddepression who was admitted with shortness of breath and leg edema, and found to be in atrial fibrillation with RVR at 134bpm. CT chest revealed evidence of congestive heart failure and bilateral pleural effusion with edema.  Subjective: The pt developed severe nausea w/ dry heaves this morning.  At present this as subsided, but she has no appetite at this time.  She denies cp, sob, or HA at this time.    Assessment & Plan:  Acute on chronic diastolic congestive heart failure Has diuresed well and now appears to be at dry weight - transitioned to oral Lasix - no volume overload on exam today   A. fib with RVR CHA2DS2-VASc at least 4 - care as per Cardiology - had some tachycardia this morning when nauseated/heaving - cardizem increased by Cards today   Chronic Bronchiectasis without COPD - chronic Pseudomonas colonization  Clinically stable at this time w/ no acute exacerbation   Hypothyroidism continue Levothyroxine  Dysphagia reports having difficulty swallowing pills but declines being evaluated by speech therapy  Weight loss Patient reported at least 10-20 pound weight loss over the last 6 months - CT abdomen and pelvis 7/30 showed no acute abnormalities - Albumin appears within normal limits - pt is however quiet cachectic - TSH is quite elevated at 10.5, but FT4 is normal - suggest recheck of TSH in 4-6 weeks   DVT prophylaxis: eliquis Code Status: FULL CODE Family Communication: spoke w/ husband at bedside  Disposition Plan: hopeful for d/c home 9/1  Consultants:  Cardiology   Antimicrobials:  none  Objective: Blood pressure (!) 147/72, pulse (!) 55, temperature 97.9 F (36.6 C), temperature source Axillary, resp. rate (!) 24, height 5\' 7"   (1.702 m), weight 47.9 kg, SpO2 92 %.  Intake/Output Summary (Last 24 hours) at 04/02/2018 1116 Last data filed at 04/02/2018 0939 Gross per 24 hour  Intake 246 ml  Output 1950 ml  Net -1704 ml   Filed Weights   03/31/18 0700 04/01/18 0639 04/02/18 0457  Weight: 52.5 kg 51.8 kg 47.9 kg    Examination: General: No acute respiratory distress - alert  Lungs: CTA B - no wheezing  Cardiovascular: Regular rate at 90 - no M  Abdomen: thin, soft, bs+, no mass  Extremities: No C/C/E B LE   CBC: Recent Labs  Lab 03/30/18 0804 03/31/18 0254 04/01/18 0349 04/02/18 0448  WBC 6.1 7.1 7.7 10.0  NEUTROABS 4.0 4.6  --   --   HGB 12.0 12.5 12.2 14.1  HCT 37.6 39.6 38.8 44.4  MCV 98.7 99.2 98.2 98.2  PLT 177 183 178 287   Basic Metabolic Panel: Recent Labs  Lab 03/30/18 0155 03/31/18 0254 04/01/18 0349 04/02/18 0448  NA 140 139 139 139  K 3.6 3.2* 3.8 3.6  CL 96* 94* 96* 91*  CO2 33* 34* 38* 38*  GLUCOSE 97 101* 111* 120*  BUN 14 17 19 19   CREATININE 0.84 0.88 1.02* 1.05*  CALCIUM 8.8* 8.6* 8.9 9.3  MG 2.1 2.0  --   --   PHOS  --  4.5  --   --    GFR: Estimated Creatinine Clearance: 27.5 mL/min (A) (by C-G formula based on SCr of 1.05 mg/dL (H)).  Liver Function Tests: Recent Labs  Lab  03/29/18 1612 03/31/18 0254  AST 37  --   ALT 28  --   ALKPHOS 84  --   BILITOT 1.2  --   PROT 7.7  --   ALBUMIN 3.7 3.4*    Coagulation Profile: Recent Labs  Lab 03/29/18 1652  INR 1.12    Cardiac Enzymes: Recent Labs  Lab 03/29/18 2050 03/30/18 0155 03/30/18 0804  TROPONINI <0.03 <0.03 <0.03    Recent Results (from the past 240 hour(s))  Urine culture     Status: Abnormal   Collection Time: 03/30/18  1:08 AM  Result Value Ref Range Status   Specimen Description URINE, RANDOM  Final   Special Requests NONE  Final   Culture (A)  Final    <10,000 COLONIES/mL INSIGNIFICANT GROWTH Performed at [facility], [place] [address] [place], [place] [address]    Report  Status 03/31/2018 FINAL  Final  MRSA PCR Screening     Status: None   Collection Time: 03/30/18  2:22 AM  Result Value Ref Range Status   MRSA by PCR NEGATIVE NEGATIVE Final    Comment:        The GeneXpert MRSA Assay (FDA approved for NASAL specimens only), is one component of a comprehensive MRSA colonization surveillance program. It is not intended to diagnose MRSA infection nor to guide or monitor treatment for MRSA infections. Performed at [facility], [place] [address], [place], [place] [address]      Scheduled Meds: . apixaban  2.5 mg Oral BID  . [START ON 04/03/2018] diltiazem  240 mg Oral Daily  . feeding supplement (ENSURE ENLIVE)  237 mL Oral BID BM  . folic acid  817 mcg Oral Daily  . furosemide  40 mg Oral BID  . levothyroxine  75 mcg Oral QAC breakfast  . mometasone-formoterol  2 puff Inhalation BID  . pantoprazole  40 mg Oral Daily  . sodium chloride flush  3 mL Intravenous Q12H  . vitamin B-12  1,000 mcg Oral Daily     LOS: 4 days   Cherene Altes, MD [facility] Office  [phone] Pager - Text Page per Amion  If 7PM-7AM, please contact night-coverage per Amion 04/02/2018, 11:16 AM

## 2018-04-02 NOTE — Progress Notes (Signed)
Progress Note  Patient Name: [name] Date of Encounter: 04/02/2018  Primary Cardiologist: Ezzard Standing, MD   Subjective   Very nauseous this morning, had some dry heaves, did not vomit. Ventricular rate was around 100 overnight, 120s this morning.  Blood pressure relatively high at 147/76. Denies problems with dyspnea and does not have angina. Transitioned to oral furosemide.  Inpatient Medications    Scheduled Meds: . apixaban  2.5 mg Oral BID  . [START ON 04/03/2018] diltiazem  240 mg Oral Daily  . feeding supplement (ENSURE ENLIVE)  237 mL Oral BID BM  . folic acid  956 mcg Oral Daily  . furosemide  40 mg Oral BID  . levothyroxine  75 mcg Oral QAC breakfast  . mometasone-formoterol  2 puff Inhalation BID  . pantoprazole  40 mg Oral Daily  . sodium chloride flush  3 mL Intravenous Q12H  . vitamin B-12  1,000 mcg Oral Daily   Continuous Infusions: . sodium chloride     PRN Meds: sodium chloride, acetaminophen, alum & mag hydroxide-simeth, clorazepate, HYDROcodone-homatropine, levalbuterol, magnesium hydroxide, Melatonin, nitroGLYCERIN, ondansetron (ZOFRAN) IV, sodium chloride flush   Vital Signs    Vitals:   04/02/18 0022 04/02/18 0457 04/02/18 0758 04/02/18 0800  BP: 137/74 117/76 (!) 147/72 (!) 147/72  Pulse: (!) 59 96 (!) 55   Resp: 20 (!) 21 20 (!) 21  Temp: (!) 97.5 F (36.4 C) 97.8 F (36.6 C) 97.9 F (36.6 C)   TempSrc: Oral Oral Axillary   SpO2: 100% 96% 92%   Weight:  47.9 kg    Height:        Intake/Output Summary (Last 24 hours) at 04/02/2018 0944 Last data filed at 04/02/2018 0939 Gross per 24 hour  Intake 252 ml  Output 1950 ml  Net -1698 ml   Filed Weights   03/31/18 0700 04/01/18 0639 04/02/18 0457  Weight: 52.5 kg 51.8 kg 47.9 kg    Telemetry    Atrial fibrillation rapid ventricular response- Personally Reviewed  ECG    No new tracing- Personally Reviewed  Physical Exam  Cachectic GEN: No acute distress.   Neck:  No JVD Cardiac:  Regular, and aortic ejection murmur, no diastolic murmurs, rubs, or gallops.  Respiratory: Clear to auscultation bilaterally. GI: Soft, nontender, non-distended  MS: No edema; No deformity. Neuro:  Nonfocal  Psych: Normal affect   Labs    Chemistry Recent Labs  Lab 03/29/18 1612  03/31/18 0254 04/01/18 0349 04/02/18 0448  NA  --    < > 139 139 139  K  --    < > 3.2* 3.8 3.6  CL  --    < > 94* 96* 91*  CO2  --    < > 34* 38* 38*  GLUCOSE  --    < > 101* 111* 120*  BUN  --    < > 17 19 19   CREATININE  --    < > 0.88 1.02* 1.05*  CALCIUM  --    < > 8.6* 8.9 9.3  PROT 7.7  --   --   --   --   ALBUMIN 3.7  --  3.4*  --   --   AST 37  --   --   --   --   ALT 28  --   --   --   --   ALKPHOS 84  --   --   --   --   BILITOT 1.2  --   --   --   --  GFRNONAA  --    < > 57* 47* 46*  GFRAA  --    < > >60 55* 53*  ANIONGAP  --    < > 11 5 10    < > = values in this interval not displayed.     Hematology Recent Labs  Lab 03/31/18 0254 04/01/18 0349 04/02/18 0448  WBC 7.1 7.7 10.0  RBC 3.99 3.95 4.52  HGB 12.5 12.2 14.1  HCT 39.6 38.8 44.4  MCV 99.2 98.2 98.2  MCH 31.3 30.9 31.2  MCHC 31.6 31.4 31.8  RDW 14.6 14.3 14.0  PLT 183 178 197    Cardiac Enzymes Recent Labs  Lab 03/29/18 2050 03/30/18 0155 03/30/18 0804  TROPONINI <0.03 <0.03 <0.03    Recent Labs  Lab 03/29/18 1619  TROPIPOC 0.01     BNP Recent Labs  Lab 03/29/18 1612  BNP 604.9*     DDimer No results for input(s): DDIMER in the last 168 hours.   Radiology    No results found.  Cardiac Studies   March 31 2018 echo Study Conclusions  - Left ventricle: The cavity size was normal. Systolic function was   mildly reduced. The estimated ejection fraction was in the range   of 45% to 50%. Wall motion was normal; there were no regional   wall motion abnormalities. - Aortic valve: There was mild regurgitation. Valve area (VTI):   1.48 cm^2. Valve area (Vmax): 1.47 cm^2.  Valve area (Vmean): 1.52   cm^2. - Mitral valve: There was mild to moderate regurgitation. - Left atrium: The atrium was mildly dilated. - Right atrium: The atrium was mildly dilated. - Pulmonary arteries: Systolic pressure was mildly increased. PA   peak pressure: 33 mm Hg (S).  Patient Profile     84 y.o. female with atrial fibrillation with rapid ventricular response (new diagnosis) causing exacerbation of chronic diastolic heart failure (most recent EF mildly depressed 45-50%, possibly tachycardia related), background of chronic abdominal complaints and severe weight loss of uncertain cause  Assessment & Plan    Increase diltiazem to a dose of 240 mg daily.  She describes previous problems with hypotension, but right now her blood pressure appears to tolerate it.  Digoxin and amiodarone would be the only options for rate control without blood pressure reduction, but both would appear to be inappropriate due to her abdominal complaints and higher side effect profile.  Continue oral anticoagulants.  Oral diuretics.      For questions or updates, please contact [place] Please consult [email or web address] for contact info under Cardiology/STEMI.      Signed, Sanda Klein, MD  04/02/2018, 9:44 AM

## 2018-04-03 ENCOUNTER — Inpatient Hospital Stay (HOSPITAL_COMMUNITY): Payer: Medicare Other

## 2018-04-03 LAB — CBC
HCT: 43.4 % (ref 36.0–46.0)
Hemoglobin: 13.7 g/dL (ref 12.0–15.0)
MCH: 31.2 pg (ref 26.0–34.0)
MCHC: 31.6 g/dL (ref 30.0–36.0)
MCV: 98.9 fL (ref 78.0–100.0)
Platelets: 186 10*3/uL (ref 150–400)
RBC: 4.39 MIL/uL (ref 3.87–5.11)
RDW: 14.1 % (ref 11.5–15.5)
WBC: 9.4 10*3/uL (ref 4.0–10.5)

## 2018-04-03 LAB — COMPREHENSIVE METABOLIC PANEL
ALBUMIN: 3.5 g/dL (ref 3.5–5.0)
ALK PHOS: 79 U/L (ref 38–126)
ALT: 22 U/L (ref 0–44)
AST: 25 U/L (ref 15–41)
Anion gap: 7 (ref 5–15)
BILIRUBIN TOTAL: 0.9 mg/dL (ref 0.3–1.2)
BUN: 29 mg/dL — ABNORMAL HIGH (ref 8–23)
CALCIUM: 9 mg/dL (ref 8.9–10.3)
CO2: 41 mmol/L — ABNORMAL HIGH (ref 22–32)
Chloride: 91 mmol/L — ABNORMAL LOW (ref 98–111)
Creatinine, Ser: 1.33 mg/dL — ABNORMAL HIGH (ref 0.44–1.00)
GFR calc Af Amer: 40 mL/min — ABNORMAL LOW (ref >60)
GFR calc non Af Amer: 34 mL/min — ABNORMAL LOW (ref >60)
GLUCOSE: 121 mg/dL — AB (ref 70–99)
Potassium: 3.8 mmol/L (ref 3.5–5.1)
Sodium: 139 mmol/L (ref 135–145)
TOTAL PROTEIN: 7.7 g/dL (ref 6.5–8.1)

## 2018-04-03 NOTE — Progress Notes (Signed)
[place] TEAM 1 - Stepdown/ICU TEAM  [name]  NFA:[id number] DOB: 1927/06/16 DOA: 03/29/2018 PCP: [name], MD    Brief Narrative:  84yo F w/ a hx of diastolic CHF, CAD, COPD, pernicious anemia, anxiety, anddepression who was admitted with shortness of breath and leg edema, and found to be in atrial fibrillation with RVR at 134bpm. CT chest revealed evidence of congestive heart failure and bilateral pleural effusion with edema.  Subjective: The patient continues to complain of severe nausea with dry heaves and the inability to tolerate oral intake.  She denies chest pain or shortness of breath.  She denies fevers or chills.  Assessment & Plan:  Intractable nausea with dry heaving No clear etiology - check KUB - check UA  Acute on chronic diastolic congestive heart failure Has diuresed well and now appears to be at dry weight - holding diuretic due to very poor intake  A. fib with RVR CHA2DS2-VASc at least 4 - care as per Cardiology w/ no change in tx today   Chronic Bronchiectasis without COPD - chronic Pseudomonas colonization  Clinically stable at this time w/ no acute exacerbation - has been quite difficult to manage in the outpatient settings as reflected in PCCM clinic notes  Hypothyroidism continue Levothyroxine  Dysphagia reports having difficulty swallowing pills but declines being evaluated by speech therapy  Weight loss Patient reported at least 10-20 pound weight loss over the last 6 months - CT abdomen and pelvis 7/30 showed no acute abnormalities - Albumin appears within normal limits - pt is quite cachectic - TSH is quite elevated at 10.5, but FT4 is normal - suggest recheck of TSH in 4-6 weeks   DVT prophylaxis: eliquis Code Status: FULL CODE Family Communication: spoke w/ husband at bedside  Disposition Plan: hopeful for d/c home 9/2  Consultants:  Cardiology   Antimicrobials:  none  Objective: Blood pressure 107/63, pulse 76,  temperature 98.5 F (36.9 C), temperature source Oral, resp. rate 20, height 5\' 7"  (1.702 m), weight 45.8 kg, SpO2 100 %.  Intake/Output Summary (Last 24 hours) at 04/03/2018 1552 Last data filed at 04/03/2018 1247 Gross per 24 hour  Intake 500 ml  Output 490 ml  Net 10 ml   Filed Weights   04/01/18 0639 04/02/18 0457 04/03/18 0539  Weight: 51.8 kg 47.9 kg 45.8 kg    Examination: General: No acute respiratory distress - flat affect - withdrawn Lungs: CTA B w/o wheezing  Cardiovascular: Regular rate w/o M or rub  Abdomen: thin, soft, bs+, no mass, no rebound   Extremities: no edema bilateral lower extremities  CBC: Recent Labs  Lab 03/30/18 0804 03/31/18 0254 04/01/18 0349 04/02/18 0448 04/03/18 0544  WBC 6.1 7.1 7.7 10.0 9.4  NEUTROABS 4.0 4.6  --   --   --   HGB 12.0 12.5 12.2 14.1 13.7  HCT 37.6 39.6 38.8 44.4 43.4  MCV 98.7 99.2 98.2 98.2 98.9  PLT 177 183 178 197 469   Basic Metabolic Panel: Recent Labs  Lab 03/30/18 0155 03/31/18 0254 04/01/18 0349 04/02/18 0448 04/03/18 0544  NA 140 139 139 139 139  K 3.6 3.2* 3.8 3.6 3.8  CL 96* 94* 96* 91* 91*  CO2 33* 34* 38* 38* 41*  GLUCOSE 97 101* 111* 120* 121*  BUN 14 17 19 19  29*  CREATININE 0.84 0.88 1.02* 1.05* 1.33*  CALCIUM 8.8* 8.6* 8.9 9.3 9.0  MG 2.1 2.0  --   --   --   PHOS  --  4.5  --   --   --    GFR: Estimated Creatinine Clearance: 20.7 mL/min (A) (by C-G formula based on SCr of 1.33 mg/dL (H)).  Liver Function Tests: Recent Labs  Lab 03/29/18 1612 03/31/18 0254 04/03/18 0544  AST 37  --  25  ALT 28  --  22  ALKPHOS 84  --  79  BILITOT 1.2  --  0.9  PROT 7.7  --  7.7  ALBUMIN 3.7 3.4* 3.5    Coagulation Profile: Recent Labs  Lab 03/29/18 1652  INR 1.12    Cardiac Enzymes: Recent Labs  Lab 03/29/18 2050 03/30/18 0155 03/30/18 0804  TROPONINI <0.03 <0.03 <0.03    Recent Results (from the past 240 hour(s))  Urine culture     Status: Abnormal   Collection Time: 03/30/18   1:08 AM  Result Value Ref Range Status   Specimen Description URINE, RANDOM  Final   Special Requests NONE  Final   Culture (A)  Final    <10,000 COLONIES/mL INSIGNIFICANT GROWTH Performed at [facility], [place] [address], [place], [place] [address]    Report Status 03/31/2018 FINAL  Final  MRSA PCR Screening     Status: None   Collection Time: 03/30/18  2:22 AM  Result Value Ref Range Status   MRSA by PCR NEGATIVE NEGATIVE Final    Comment:        The GeneXpert MRSA Assay (FDA approved for NASAL specimens only), is one component of a comprehensive MRSA colonization surveillance program. It is not intended to diagnose MRSA infection nor to guide or monitor treatment for MRSA infections. Performed at [facility], [place] [address], [place], [place] [address]      Scheduled Meds: . apixaban  2.5 mg Oral BID  . diltiazem  240 mg Oral Daily  . feeding supplement (ENSURE ENLIVE)  237 mL Oral BID BM  . folic acid  828 mcg Oral Daily  . levothyroxine  75 mcg Oral QAC breakfast  . mometasone-formoterol  2 puff Inhalation BID  . pantoprazole  40 mg Oral Daily  . sodium chloride flush  3 mL Intravenous Q12H  . vitamin B-12  1,000 mcg Oral Daily     LOS: 5 days   Cherene Altes, MD [facility] Office  [phone] Pager - Text Page per Amion  If 7PM-7AM, please contact night-coverage per Amion 04/03/2018, 3:52 PM

## 2018-04-03 NOTE — Progress Notes (Signed)
Patient ran a 3 beat run of Vtach. Patient asymptomatic, will continue to monitor.

## 2018-04-03 NOTE — Progress Notes (Signed)
Progress Note  Patient Name: [name] Date of Encounter: 04/03/2018  Primary Cardiologist: Ezzard Standing, MD   Subjective   Improved atrial fibrillation rate control, generally under 100 bpm.  "V. tach" seen on monitor represents aberrant conduction.  She is not aware of palpitations Her complaints remain mostly GI related. Weight is down another 4 pounds since yesterday, although in/out reportedly balanced.  Net 5.5 L diuresis since admission.  Inpatient Medications    Scheduled Meds: . apixaban  2.5 mg Oral BID  . diltiazem  240 mg Oral Daily  . feeding supplement (ENSURE ENLIVE)  237 mL Oral BID BM  . folic acid  063 mcg Oral Daily  . furosemide  40 mg Oral BID  . levothyroxine  75 mcg Oral QAC breakfast  . mometasone-formoterol  2 puff Inhalation BID  . pantoprazole  40 mg Oral Daily  . sodium chloride flush  3 mL Intravenous Q12H  . vitamin B-12  1,000 mcg Oral Daily   Continuous Infusions: . sodium chloride     PRN Meds: sodium chloride, acetaminophen, alum & mag hydroxide-simeth, clorazepate, HYDROcodone-homatropine, levalbuterol, magnesium hydroxide, Melatonin, nitroGLYCERIN, ondansetron (ZOFRAN) IV, sodium chloride flush   Vital Signs    Vitals:   04/03/18 0032 04/03/18 0100 04/03/18 0539 04/03/18 0700  BP: 123/90 122/81 (!) 141/86 (!) 129/97  Pulse: 83  92 99  Resp: 20 18 19  (!) 22  Temp: 98 F (36.7 C)  (!) 97.5 F (36.4 C) (!) 97.4 F (36.3 C)  TempSrc: Axillary  Axillary Oral  SpO2: 98%  100% 95%  Weight:   45.8 kg   Height:        Intake/Output Summary (Last 24 hours) at 04/03/2018 1101 Last data filed at 04/03/2018 0849 Gross per 24 hour  Intake 480 ml  Output 490 ml  Net -10 ml   Filed Weights   04/01/18 0639 04/02/18 0457 04/03/18 0539  Weight: 51.8 kg 47.9 kg 45.8 kg    Telemetry    She will fibrillation with controlled ventricular response, occasional RVR, occasional aberrant conduction- Personally Reviewed  ECG    No  new tracing- Personally Reviewed  Physical Exam  Cachectic GEN: No acute distress.   Neck: No JVD Cardiac: irregular, 1/6 aortic ejection murmur,  no diastolic murmurs, rubs, or gallops.  Respiratory: Clear to auscultation bilaterally. GI: Soft, nontender, non-distended  MS: No edema; No deformity. Neuro:  Nonfocal  Psych: Normal affect   Labs    Chemistry Recent Labs  Lab 03/29/18 1612  03/31/18 0254 04/01/18 0349 04/02/18 0448 04/03/18 0544  NA  --    < > 139 139 139 139  K  --    < > 3.2* 3.8 3.6 3.8  CL  --    < > 94* 96* 91* 91*  CO2  --    < > 34* 38* 38* 41*  GLUCOSE  --    < > 101* 111* 120* 121*  BUN  --    < > 17 19 19  29*  CREATININE  --    < > 0.88 1.02* 1.05* 1.33*  CALCIUM  --    < > 8.6* 8.9 9.3 9.0  PROT 7.7  --   --   --   --  7.7  ALBUMIN 3.7  --  3.4*  --   --  3.5  AST 37  --   --   --   --  25  ALT 28  --   --   --   --  22  ALKPHOS 84  --   --   --   --  79  BILITOT 1.2  --   --   --   --  0.9  GFRNONAA  --    < > 57* 47* 46* 34*  GFRAA  --    < > >60 55* 53* 40*  ANIONGAP  --    < > 11 5 10 7    < > = values in this interval not displayed.     Hematology Recent Labs  Lab 04/01/18 0349 04/02/18 0448 04/03/18 0544  WBC 7.7 10.0 9.4  RBC 3.95 4.52 4.39  HGB 12.2 14.1 13.7  HCT 38.8 44.4 43.4  MCV 98.2 98.2 98.9  MCH 30.9 31.2 31.2  MCHC 31.4 31.8 31.6  RDW 14.3 14.0 14.1  PLT 178 197 186    Cardiac Enzymes Recent Labs  Lab 03/29/18 2050 03/30/18 0155 03/30/18 0804  TROPONINI <0.03 <0.03 <0.03    Recent Labs  Lab 03/29/18 1619  TROPIPOC 0.01     BNP Recent Labs  Lab 03/29/18 1612  BNP 604.9*     DDimer No results for input(s): DDIMER in the last 168 hours.   Radiology    No results found.  Cardiac Studies    March 31 2018 echo Study Conclusions  - Left ventricle: The cavity size was normal. Systolic function was mildly reduced. The estimated ejection fraction was in the range of 45% to 50%. Wall  motion was normal; there were no regional wall motion abnormalities. - Aortic valve: There was mild regurgitation. Valve area (VTI): 1.48 cm^2. Valve area (Vmax): 1.47 cm^2. Valve area (Vmean): 1.52 cm^2. - Mitral valve: There was mild to moderate regurgitation. - Left atrium: The atrium was mildly dilated. - Right atrium: The atrium was mildly dilated. - Pulmonary arteries: Systolic pressure was mildly increased. PA peak pressure: 33 mm Hg (S).   Patient Profile     84 y.o. female with atrial fibrillation with rapid ventricular response (new diagnosis) causing exacerbation of chronic diastolic heart failure (most recent EF mildly depressed 45-50%, possibly tachycardia related), background of chronic abdominal complaints and severe weight loss of uncertain cause   Assessment & Plan    1. AFib: Improved atrial fibrillation rate control (adequate, even if not perfect).  The rate control medications limited by low blood pressure.  Seems to be tolerating the current increased dose of diltiazem.  Continue oral anticoagulants. 2. CHF: Ports are "oxygen dropping" last night, but at this point she appears very comfortable and oxygen saturation is 100%.  She is euvolemic, possibly slightly "dry".  Note substantial bump in BUN and creatinine following diuresis.  Would hold diuretics today.  Resume tomorrow as a once a day medication if her renal function shows improvement.     For questions or updates, please contact [place] Please consult [email or web address] for contact info under Cardiology/STEMI.      Signed, Sanda Klein, MD  04/03/2018, 11:01 AM

## 2018-04-04 ENCOUNTER — Inpatient Hospital Stay (HOSPITAL_COMMUNITY): Payer: Medicare Other

## 2018-04-04 DIAGNOSIS — I5023 Acute on chronic systolic (congestive) heart failure: Secondary | ICD-10-CM

## 2018-04-04 LAB — CBC
HEMATOCRIT: 42.8 % (ref 36.0–46.0)
HEMOGLOBIN: 13.5 g/dL (ref 12.0–15.0)
MCH: 31.3 pg (ref 26.0–34.0)
MCHC: 31.5 g/dL (ref 30.0–36.0)
MCV: 99.3 fL (ref 78.0–100.0)
Platelets: 175 10*3/uL (ref 150–400)
RBC: 4.31 MIL/uL (ref 3.87–5.11)
RDW: 13.9 % (ref 11.5–15.5)
WBC: 8.9 10*3/uL (ref 4.0–10.5)

## 2018-04-04 LAB — COMPREHENSIVE METABOLIC PANEL
ALBUMIN: 3.5 g/dL (ref 3.5–5.0)
ALT: 20 U/L (ref 0–44)
AST: 26 U/L (ref 15–41)
Alkaline Phosphatase: 74 U/L (ref 38–126)
Anion gap: 8 (ref 5–15)
BILIRUBIN TOTAL: 1.1 mg/dL (ref 0.3–1.2)
BUN: 30 mg/dL — AB (ref 8–23)
CHLORIDE: 90 mmol/L — AB (ref 98–111)
CO2: 40 mmol/L — ABNORMAL HIGH (ref 22–32)
Calcium: 9.1 mg/dL (ref 8.9–10.3)
Creatinine, Ser: 1.07 mg/dL — ABNORMAL HIGH (ref 0.44–1.00)
GFR calc Af Amer: 52 mL/min — ABNORMAL LOW (ref >60)
GFR, EST NON AFRICAN AMERICAN: 45 mL/min — AB (ref >60)
Glucose, Bld: 91 mg/dL (ref 70–99)
POTASSIUM: 3.8 mmol/L (ref 3.5–5.1)
Sodium: 138 mmol/L (ref 135–145)
Total Protein: 7.4 g/dL (ref 6.5–8.1)

## 2018-04-04 LAB — AMMONIA: AMMONIA: 50 umol/L — AB (ref 9–35)

## 2018-04-04 MED ORDER — SORBITOL 70 % SOLN
960.0000 mL | TOPICAL_OIL | Freq: Once | ORAL | Status: DC | PRN
  Filled 2018-04-04: qty 473

## 2018-04-04 MED ORDER — MAGNESIUM CITRATE PO SOLN
0.5000 | Freq: Once | ORAL | Status: AC
  Administered 2018-04-04: 0.5 via ORAL
  Filled 2018-04-04: qty 296

## 2018-04-04 MED ORDER — SENNOSIDES-DOCUSATE SODIUM 8.6-50 MG PO TABS
1.0000 | ORAL_TABLET | Freq: Two times a day (BID) | ORAL | Status: DC
  Administered 2018-04-04 – 2018-04-06 (×4): 1 via ORAL
  Filled 2018-04-04 (×6): qty 1

## 2018-04-04 MED ORDER — BISACODYL 10 MG RE SUPP
10.0000 mg | Freq: Every day | RECTAL | Status: DC | PRN
  Administered 2018-04-04: 10 mg via RECTAL
  Filled 2018-04-04: qty 1

## 2018-04-04 MED ORDER — DILTIAZEM HCL ER COATED BEADS 180 MG PO CP24
360.0000 mg | ORAL_CAPSULE | Freq: Every day | ORAL | Status: DC
  Administered 2018-04-04 – 2018-04-06 (×3): 360 mg via ORAL
  Filled 2018-04-04 (×3): qty 2

## 2018-04-04 NOTE — Progress Notes (Signed)
[place] TEAM 1 - Stepdown/ICU TEAM  ALONAH LINEBACK  RXV:[id number] DOB: 10-20-1926 DOA: 03/29/2018 PCP: [name], MD    Brief Narrative:  84yo F w/ a hx of diastolic CHF, CAD, COPD, pernicious anemia, anxiety, anddepression who was admitted with shortness of breath and leg edema, and found to be in atrial fibrillation with RVR at 134bpm. CT chest revealed evidence of congestive heart failure and bilateral pleural effusion with edema.  Subjective: KUB which she finally let us accomplish today noted severe constipation.  Tx for same has been initiated.  No CP, sob, vomiting.  In better spirits today / more alert.    Assessment & Plan:  Intractable nausea with dry heaving - severe constipation tx has begun - if no response will increase efforts - home when moving bowels   Acute on chronic diastolic congestive heart failure Has diuresed well and now appears to be at dry weight - holding diuretic due to very poor intake  A. fib with RVR CHA2DS2-VASc at least 4 - care as per Cardiology - rate now well controlled   Chronic Bronchiectasis without COPD - chronic Pseudomonas colonization  Clinically stable at this time w/ no acute exacerbation - has been quite difficult to manage in the outpatient settings as reflected in PCCM clinic notes  Hypothyroidism continue Levothyroxine  Dysphagia reports having difficulty swallowing pills but declines being evaluated by speech therapy  Weight loss Patient reported at least 10-20 pound weight loss over the last 6 months - CT abdomen and pelvis 7/30 showed no acute abnormalities - Albumin appears within normal limits - pt is quite cachectic - TSH is quite elevated at 10.5, but FT4 is normal - suggest recheck of TSH in 4-6 weeks   DVT prophylaxis: eliquis Code Status: FULL CODE Family Communication: spoke w/ husband at bedside  Disposition Plan: d/c home when constipation resolved   Consultants:  Cardiology   Antimicrobials:    none  Objective: Blood pressure 121/68, pulse (!) 106, temperature (!) 97.4 F (36.3 C), temperature source Oral, resp. rate 19, height 5\' 7"  (1.702 m), weight 47.8 kg, SpO2 93 %.  Intake/Output Summary (Last 24 hours) at 04/04/2018 1539 Last data filed at 04/04/2018 0940 Gross per 24 hour  Intake 457 ml  Output 675 ml  Net -218 ml   Filed Weights   04/02/18 0457 04/03/18 0539 04/04/18 0553  Weight: 47.9 kg 45.8 kg 47.8 kg    Examination: General: NAD Lungs: CTA B  Cardiovascular: Regular rate  Abdomen: thin, soft, bs+, no mass or rebound  Extremities: no edema B LE   CBC: Recent Labs  Lab 03/30/18 0804 03/31/18 0254  04/02/18 0448 04/03/18 0544 04/04/18 0359  WBC 6.1 7.1   < > 10.0 9.4 8.9  NEUTROABS 4.0 4.6  --   --   --   --   HGB 12.0 12.5   < > 14.1 13.7 13.5  HCT 37.6 39.6   < > 44.4 43.4 42.8  MCV 98.7 99.2   < > 98.2 98.9 99.3  PLT 177 183   < > 197 186 175   < > = values in this interval not displayed.   Basic Metabolic Panel: Recent Labs  Lab 03/30/18 0155 03/31/18 0254  04/02/18 0448 04/03/18 0544 04/04/18 0359  NA 140 139   < > 139 139 138  K 3.6 3.2*   < > 3.6 3.8 3.8  CL 96* 94*   < > 91* 91* 90*  CO2 33* 34*   < >  38* 41* 40*  GLUCOSE 97 101*   < > 120* 121* 91  BUN 14 17   < > 19 29* 30*  CREATININE 0.84 0.88   < > 1.05* 1.33* 1.07*  CALCIUM 8.8* 8.6*   < > 9.3 9.0 9.1  MG 2.1 2.0  --   --   --   --   PHOS  --  4.5  --   --   --   --    < > = values in this interval not displayed.   GFR: Estimated Creatinine Clearance: 26.9 mL/min (A) (by C-G formula based on SCr of 1.07 mg/dL (H)).  Liver Function Tests: Recent Labs  Lab 03/29/18 1612 03/31/18 0254 04/03/18 0544 04/04/18 0359  AST 37  --  25 26  ALT 28  --  22 20  ALKPHOS 84  --  79 74  BILITOT 1.2  --  0.9 1.1  PROT 7.7  --  7.7 7.4  ALBUMIN 3.7 3.4* 3.5 3.5    Coagulation Profile: Recent Labs  Lab 03/29/18 1652  INR 1.12    Cardiac Enzymes: Recent Labs  Lab  03/29/18 2050 03/30/18 0155 03/30/18 0804  TROPONINI <0.03 <0.03 <0.03    Recent Results (from the past 240 hour(s))  Urine culture     Status: Abnormal   Collection Time: 03/30/18  1:08 AM  Result Value Ref Range Status   Specimen Description URINE, RANDOM  Final   Special Requests NONE  Final   Culture (A)  Final    <10,000 COLONIES/mL INSIGNIFICANT GROWTH Performed at [facility], [place] [address] [place], [place] [address]    Report Status 03/31/2018 FINAL  Final  MRSA PCR Screening     Status: None   Collection Time: 03/30/18  2:22 AM  Result Value Ref Range Status   MRSA by PCR NEGATIVE NEGATIVE Final    Comment:        The GeneXpert MRSA Assay (FDA approved for NASAL specimens only), is one component of a comprehensive MRSA colonization surveillance program. It is not intended to diagnose MRSA infection nor to guide or monitor treatment for MRSA infections. Performed at [facility], [place] [address] [place], [place] [address]      Scheduled Meds: . apixaban  2.5 mg Oral BID  . diltiazem  360 mg Oral Daily  . feeding supplement (ENSURE ENLIVE)  237 mL Oral BID BM  . folic acid  016 mcg Oral Daily  . levothyroxine  75 mcg Oral QAC breakfast  . mometasone-formoterol  2 puff Inhalation BID  . pantoprazole  40 mg Oral Daily  . senna-docusate  1 tablet Oral BID  . sodium chloride flush  3 mL Intravenous Q12H  . vitamin B-12  1,000 mcg Oral Daily     LOS: 6 days   Cherene Altes, MD [facility] Office  [phone] Pager - Text Page per Shea Evans  If 7PM-7AM, please contact night-coverage per Amion 04/04/2018, 3:39 PM

## 2018-04-04 NOTE — Progress Notes (Signed)
Primary Cardiologist: Ezzard Standing, MD   Progress Note   Subjective   Doing better today.  Continues to have nausea but feels that this is improved.  The patient denies SOB.  No new concerns  Inpatient Medications    Scheduled Meds: . apixaban  2.5 mg Oral BID  . diltiazem  360 mg Oral Daily  . feeding supplement (ENSURE ENLIVE)  237 mL Oral BID BM  . folic acid  101 mcg Oral Daily  . levothyroxine  75 mcg Oral QAC breakfast  . mometasone-formoterol  2 puff Inhalation BID  . pantoprazole  40 mg Oral Daily  . sodium chloride flush  3 mL Intravenous Q12H  . vitamin B-12  1,000 mcg Oral Daily   Continuous Infusions: . sodium chloride     PRN Meds: sodium chloride, acetaminophen, alum & mag hydroxide-simeth, clorazepate, levalbuterol, magnesium hydroxide, Melatonin, nitroGLYCERIN, ondansetron (ZOFRAN) IV, sodium chloride flush   Vital Signs    Vitals:   04/03/18 2118 04/03/18 2300 04/04/18 0553 04/04/18 0911  BP:  106/76 127/83   Pulse:  91 98 (!) 167  Resp:  18 17 20   Temp:   97.9 F (36.6 C)   TempSrc:   Oral   SpO2: 100% 100% 100% 95%  Weight:   47.8 kg   Height:        Intake/Output Summary (Last 24 hours) at 04/04/2018 0919 Last data filed at 04/04/2018 0500 Gross per 24 hour  Intake 480 ml  Output 675 ml  Net -195 ml   Filed Weights   04/02/18 0457 04/03/18 0539 04/04/18 0553  Weight: 47.9 kg 45.8 kg 47.8 kg    Telemetry    Afib, V rates currently 110s, aberrancy also noted - Personally Reviewed  Physical Exam   GEN- The patient is elderly and frail appearing, alert  Head- normocephalic, atraumatic Eyes-  Sclera clear, conjunctiva pink Ears- hearing intact Oropharynx- clear Neck- supple, Lungs- Clear to ausculation bilaterally, normal work of breathing Heart- irregular rate and rhythm  GI- soft, NT, ND, + BS Extremities- no clubbing, cyanosis, + edema  MS- diffuse atrophy Skin- no rash or lesion Psych- euthymic mood, full affect Neuro-  strength and sensation are intact   Labs    Chemistry Recent Labs  Lab 03/29/18 1612  03/31/18 0254  04/02/18 0448 04/03/18 0544 04/04/18 0359  NA  --    < > 139   < > 139 139 138  K  --    < > 3.2*   < > 3.6 3.8 3.8  CL  --    < > 94*   < > 91* 91* 90*  CO2  --    < > 34*   < > 38* 41* 40*  GLUCOSE  --    < > 101*   < > 120* 121* 91  BUN  --    < > 17   < > 19 29* 30*  CREATININE  --    < > 0.88   < > 1.05* 1.33* 1.07*  CALCIUM  --    < > 8.6*   < > 9.3 9.0 9.1  PROT 7.7  --   --   --   --  7.7 7.4  ALBUMIN 3.7  --  3.4*  --   --  3.5 3.5  AST 37  --   --   --   --  25 26  ALT 28  --   --   --   --  22 20  ALKPHOS 84  --   --   --   --  79 74  BILITOT 1.2  --   --   --   --  0.9 1.1  GFRNONAA  --    < > 57*   < > 46* 34* 45*  GFRAA  --    < > >60   < > 53* 40* 52*  ANIONGAP  --    < > 11   < > 10 7 8    < > = values in this interval not displayed.     Hematology Recent Labs  Lab 04/02/18 0448 04/03/18 0544 04/04/18 0359  WBC 10.0 9.4 8.9  RBC 4.52 4.39 4.31  HGB 14.1 13.7 13.5  HCT 44.4 43.4 42.8  MCV 98.2 98.9 99.3  MCH 31.2 31.2 31.3  MCHC 31.8 31.6 31.5  RDW 14.0 14.1 13.9  PLT 197 186 175    Cardiac Enzymes Recent Labs  Lab 03/29/18 2050 03/30/18 0155 03/30/18 0804  TROPONINI <0.03 <0.03 <0.03    Recent Labs  Lab 03/29/18 1619  TROPIPOC 0.01     Patient Profile     84 y.o. female with atrial fibrillation with rapid ventricular response (new diagnosis) causing exacerbation of chronic diastolic heart failure (most recent EF mildly depressed 45-50%, possibly tachycardia related), background of chronic abdominal complaints and severe weight loss of uncertain cause     Assessment & Plan    1.  Persistent afib Newly diagnosed On eliquis for chads2vasc score of 4 Increase diltiazem CD to 360mg  daily today Could consider amiodarone, however I think we should avoid given her current nausea.  2. Acute systolic dysfunction (EF 29%) Likely due  to elevated V rates Will continue rate control of afib Continue diuresis as tolerated  Overall prognosis is poor Conservative measures advised  Cardiology to follow  Fite Grayer MD, [facility] 04/04/2018 9:19 AM

## 2018-04-05 MED ORDER — SODIUM CHLORIDE 0.9 % IV SOLN
INTRAVENOUS | Status: DC
  Administered 2018-04-05: 15:00:00 via INTRAVENOUS

## 2018-04-05 NOTE — Progress Notes (Signed)
Subjective:  Still nauseous and c/o constipation.  I have known her for years and she is always difficult to evaluate because there is usually something that hurts or does not feel well. No c/o SOB today   Objective:  Vital Signs in the last 24 hours: BP 113/66 (BP Location: Right Arm)   Pulse 88   Temp 97.7 F (36.5 C) (Oral)   Resp (!) 24   Ht 5\' 7"  (1.702 m)   Wt 47 kg   SpO2 97%   BMI 16.23 kg/m   Physical Exam: Cachectic elderly white female currently in no acute distress Lungs:  Clear  Cardiac:  Irregular rhythm, normal S1 and S2, no S3, 1 to 2/6 systolic murmur Abdomen:  Soft, nontender, no masses Extremities:  no edema present, chronic venous changes noted  Intake/Output from previous day: 09/02 0701 - 09/03 0700 In: 415 [P.O.:415] Out: 750 [Urine:750] Weight Filed Weights   04/03/18 0539 04/04/18 0553 04/05/18 0308  Weight: 45.8 kg 47.8 kg 47 kg    Lab Results: Basic Metabolic Panel: Recent Labs    04/03/18 0544 04/04/18 0359  NA 139 138  K 3.8 3.8  CL 91* 90*  CO2 41* 40*  GLUCOSE 121* 91  BUN 29* 30*  CREATININE 1.33* 1.07*    CBC: Recent Labs    04/03/18 0544 04/04/18 0359  WBC 9.4 8.9  HGB 13.7 13.5  HCT 43.4 42.8  MCV 98.9 99.3  PLT 186 175    BNP    Component Value Date/Time   BNP 604.9 (H) 03/29/2018 1612    Telemetry: Atrial fibrillation with occasional PVCs versus aberrant beats.  Rate reasonable control  Assessment/Plan:  1.  Acute on chronic diastolic heart failure clinically better  2.  Acute renal failure improved 3.  New onset of atrial fibrillation rate now controlled 4.  Abdominal symptoms difficult to assess because she has had similar symptoms in the past, tends to have mutiple c/o over the years making clinical care difficult  Recommendations:  APpears to be stable over weekend with af ib and CHF at dry weight renal parameters improved  W. Doristine Church  MD [facility] Cardiology  04/05/2018, 1:56 PM

## 2018-04-05 NOTE — Progress Notes (Signed)
[place] TEAM 1 - Stepdown/ICU TEAM  [name]  [id number]:[id number] DOB: Jul 18, 1927 DOA: 03/29/2018 PCP: [name], MD    Brief Narrative:  84yo F w/ a hx of diastolic CHF, CAD, COPD, pernicious anemia, anxiety, anddepression who was admitted with shortness of breath and leg edema, and found to be in atrial fibrillation with RVR at 134bpm. CT chest revealed evidence of congestive heart failure and bilateral pleural effusion with edema.  Subjective: Pt tells me she is very weak from moving her bowels multiple times, and feels unstable on her feet and somewhat dizzy.  She denies cp, sob, or vomiting, but does c/o ongoing nausea.    Assessment & Plan:  Intractable nausea with dry heaving - severe constipation Now having bowel movements regularly - poor intake presently - gently hydrate for 12 hrs and follow   Acute on chronic diastolic congestive heart failure Has diuresed well and now appears to be at dry weight - holding diuretic due to very poor intake - gentle small volume hydration today   A. fib with RVR CHA2DS2-VASc at least 4 - care as per Cardiology - rate now well controlled   Chronic Bronchiectasis without COPD - chronic Pseudomonas colonization  Clinically stable at this time w/ no acute exacerbation - has been quite difficult to manage in the outpatient settings as reflected in PCCM clinic notes  Hypothyroidism continue Levothyroxine  Dysphagia reports having difficulty swallowing pills but declines being evaluated by speech therapy  Weight loss Patient reported at least 10-20 pound weight loss over the last 6 months - CT abdomen and pelvis 7/30 showed no acute abnormalities - Albumin appears within normal limits - pt is quite cachectic - TSH is quite elevated at 10.5, but FT4 is normal - suggest recheck of TSH in 4-6 weeks   DVT prophylaxis: eliquis Code Status: FULL CODE Family Communication: spoke w/ husband at bedside  Disposition Plan: not yet ready  for d/c - may have to consider placement is she is not soon fit for d/c as she appears severely deconditioned and quite frail (though I suspect she will refuse)   Consultants:  Cardiology   Antimicrobials:  none  Objective: Blood pressure 113/66, pulse 88, temperature 97.7 F (36.5 C), temperature source Oral, resp. rate (!) 24, height 5\' 7"  (1.702 m), weight 47 kg, SpO2 97 %.  Intake/Output Summary (Last 24 hours) at 04/05/2018 1427 Last data filed at 04/05/2018 0800 Gross per 24 hour  Intake 418 ml  Output 750 ml  Net -332 ml   Filed Weights   04/03/18 0539 04/04/18 0553 04/05/18 0308  Weight: 45.8 kg 47.8 kg 47 kg    Examination: General: NAD - flat affect/withdrawn Lungs: CTA B w/o wheezing or crackles  Cardiovascular: Regular rate w/o M or rub  Abdomen: thin, soft, bs+, no mass or rebound  Extremities: no signif edema B LE   CBC: Recent Labs  Lab 03/30/18 0804 03/31/18 0254  04/02/18 0448 04/03/18 0544 04/04/18 0359  WBC 6.1 7.1   < > 10.0 9.4 8.9  NEUTROABS 4.0 4.6  --   --   --   --   HGB 12.0 12.5   < > 14.1 13.7 13.5  HCT 37.6 39.6   < > 44.4 43.4 42.8  MCV 98.7 99.2   < > 98.2 98.9 99.3  PLT 177 183   < > 197 186 175   < > = values in this interval not displayed.   Basic Metabolic Panel: Recent Labs  Lab 03/30/18 0155 03/31/18 0254  04/02/18 0448 04/03/18 0544 04/04/18 0359  NA 140 139   < > 139 139 138  K 3.6 3.2*   < > 3.6 3.8 3.8  CL 96* 94*   < > 91* 91* 90*  CO2 33* 34*   < > 38* 41* 40*  GLUCOSE 97 101*   < > 120* 121* 91  BUN 14 17   < > 19 29* 30*  CREATININE 0.84 0.88   < > 1.05* 1.33* 1.07*  CALCIUM 8.8* 8.6*   < > 9.3 9.0 9.1  MG 2.1 2.0  --   --   --   --   PHOS  --  4.5  --   --   --   --    < > = values in this interval not displayed.   GFR: Estimated Creatinine Clearance: 26.4 mL/min (A) (by C-G formula based on SCr of 1.07 mg/dL (H)).  Liver Function Tests: Recent Labs  Lab 03/29/18 1612 03/31/18 0254 04/03/18 0544  04/04/18 0359  AST 37  --  25 26  ALT 28  --  22 20  ALKPHOS 84  --  79 74  BILITOT 1.2  --  0.9 1.1  PROT 7.7  --  7.7 7.4  ALBUMIN 3.7 3.4* 3.5 3.5    Coagulation Profile: Recent Labs  Lab 03/29/18 1652  INR 1.12    Cardiac Enzymes: Recent Labs  Lab 03/29/18 2050 03/30/18 0155 03/30/18 0804  TROPONINI <0.03 <0.03 <0.03    Recent Results (from the past 240 hour(s))  Urine culture     Status: Abnormal   Collection Time: 03/30/18  1:08 AM  Result Value Ref Range Status   Specimen Description URINE, RANDOM  Final   Special Requests NONE  Final   Culture (A)  Final    <10,000 COLONIES/mL INSIGNIFICANT GROWTH Performed at [facility], [place] [address], [place], [place] [address]    Report Status 03/31/2018 FINAL  Final  MRSA PCR Screening     Status: None   Collection Time: 03/30/18  2:22 AM  Result Value Ref Range Status   MRSA by PCR NEGATIVE NEGATIVE Final    Comment:        The GeneXpert MRSA Assay (FDA approved for NASAL specimens only), is one component of a comprehensive MRSA colonization surveillance program. It is not intended to diagnose MRSA infection nor to guide or monitor treatment for MRSA infections. Performed at [facility], [place] [address], [place], [place] [address]      Scheduled Meds: . apixaban  2.5 mg Oral BID  . diltiazem  360 mg Oral Daily  . feeding supplement (ENSURE ENLIVE)  237 mL Oral BID BM  . folic acid  366 mcg Oral Daily  . levothyroxine  75 mcg Oral QAC breakfast  . mometasone-formoterol  2 puff Inhalation BID  . pantoprazole  40 mg Oral Daily  . senna-docusate  1 tablet Oral BID  . sodium chloride flush  3 mL Intravenous Q12H  . vitamin B-12  1,000 mcg Oral Daily     LOS: 7 days   Cherene Altes, MD [facility] Office  [phone] Pager - Text Page per Shea Evans  If 7PM-7AM, please contact night-coverage per Amion 04/05/2018, 2:27 PM

## 2018-04-05 NOTE — Progress Notes (Signed)
Nutrition Follow-up  DOCUMENTATION CODES:   Severe malnutrition in context of chronic illness, Underweight  INTERVENTION:    Continue to offer Ensure Enlive po BID, each supplement provides 350 kcal and 20 grams of protein  Add Magic cup TID with meals, each supplement provides 290 kcal and 9 grams of protein  NUTRITION DIAGNOSIS:   Severe Malnutrition related to chronic illness(COPD, CHF) as evidenced by severe fat depletion, severe muscle depletion, percent weight loss(22% weight loss within 6 months).  Ongoing  GOAL:   Patient will meet greater than or equal to 90% of their needs  Unmet  MONITOR:   PO intake, Supplement acceptance  ASSESSMENT:   84 yo female with PMH of HLD, fibromyalgia, CHF, CAD, COPD, osteoporosis, fish/shellfish allergy, and peripheral neuropathy who was admitted on 8/27 with CHF exacerbation.  Patient and her husband report very poor intake today and for the past few days due to nausea. Patient has also been constipated. She has been unable to take much PO, including Ensure supplements. Meal completion 10% of breakfast today. Patient agreed to try magic cup supplement with meals.  Labs and medications reviewed.   IVF was started this afternoon due to such poor intake.   Diet Order:   Diet Order            Diet Heart Room service appropriate? Yes; Fluid consistency: Thin  Diet effective now              EDUCATION NEEDS:   No education needs have been identified at this time  Skin:  Skin Assessment: Reviewed RN Assessment  Last BM:  unknown  Height:   Ht Readings from Last 1 Encounters:  03/29/18 5\' 7"  (1.702 m)    Weight:   Wt Readings from Last 1 Encounters:  04/05/18 47 kg    Ideal Body Weight:  61.4 kg  BMI:  Body mass index is 16.23 kg/m.  Estimated Nutritional Needs:   Kcal:  1400-1600  Protein:  70-80 gm  Fluid:  1.4 L    Molli Barrows, RD, LDN, [place] Pager [phone] After Hours Pager [phone]

## 2018-04-05 NOTE — Care Management Important Message (Signed)
Important Message  Patient Details  Name: [name] MRN: [id number] Date of Birth: May 04, 1927   Medicare Important Message Given:  Yes    Taina Landry P [place] 04/05/2018, 4:45 PM

## 2018-04-05 NOTE — Progress Notes (Signed)
PT Cancellation Note  Patient Details Name: [name] MRN: [id number] DOB: 1926-12-04   Cancelled Treatment:    Reason Eval/Treat Not Completed: Other (comment)(Pt constipated and has been going to bathroom all am.)   Denice Paradise 04/05/2018, 11:23 AM  Kenetra Hildenbrand,PT Acute Rehabilitation Services Pager:  [phone]  Office:  [phone]

## 2018-04-06 LAB — COMPREHENSIVE METABOLIC PANEL
ALBUMIN: 3.2 g/dL — AB (ref 3.5–5.0)
ALK PHOS: 70 U/L (ref 38–126)
ALT: 15 U/L (ref 0–44)
AST: 21 U/L (ref 15–41)
Anion gap: 8 (ref 5–15)
BILIRUBIN TOTAL: 1 mg/dL (ref 0.3–1.2)
BUN: 17 mg/dL (ref 8–23)
CALCIUM: 8.5 mg/dL — AB (ref 8.9–10.3)
CO2: 36 mmol/L — ABNORMAL HIGH (ref 22–32)
CREATININE: 0.85 mg/dL (ref 0.44–1.00)
Chloride: 93 mmol/L — ABNORMAL LOW (ref 98–111)
GFR calc Af Amer: >60 mL/min (ref >60)
GFR calc non Af Amer: 59 mL/min — ABNORMAL LOW (ref >60)
GLUCOSE: 109 mg/dL — AB (ref 70–99)
Potassium: 3.8 mmol/L (ref 3.5–5.1)
Sodium: 137 mmol/L (ref 135–145)
TOTAL PROTEIN: 6.7 g/dL (ref 6.5–8.1)

## 2018-04-06 LAB — CBC
HEMATOCRIT: 39.2 % (ref 36.0–46.0)
Hemoglobin: 12.5 g/dL (ref 12.0–15.0)
MCH: 31.4 pg (ref 26.0–34.0)
MCHC: 31.9 g/dL (ref 30.0–36.0)
MCV: 98.5 fL (ref 78.0–100.0)
PLATELETS: 174 10*3/uL (ref 150–400)
RBC: 3.98 MIL/uL (ref 3.87–5.11)
RDW: 13.9 % (ref 11.5–15.5)
WBC: 8.9 10*3/uL (ref 4.0–10.5)

## 2018-04-06 LAB — PHOSPHORUS: PHOSPHORUS: 3 mg/dL (ref 2.5–4.6)

## 2018-04-06 LAB — MAGNESIUM: MAGNESIUM: 2.4 mg/dL (ref 1.7–2.4)

## 2018-04-06 MED ORDER — APIXABAN 2.5 MG PO TABS: 2.5000 mg | ORAL_TABLET | Freq: Two times a day (BID) | ORAL | 1 refills | Status: DC

## 2018-04-06 MED ORDER — DILTIAZEM HCL ER COATED BEADS 360 MG PO CP24: 360.0000 mg | ORAL_CAPSULE | Freq: Every day | ORAL | 0 refills | Status: DC

## 2018-04-06 MED ORDER — SENNOSIDES-DOCUSATE SODIUM 8.6-50 MG PO TABS: 1.0000 | ORAL_TABLET | Freq: Two times a day (BID) | ORAL | 0 refills | Status: DC

## 2018-04-06 MED ORDER — BISACODYL 10 MG RE SUPP: 10.0000 mg | Freq: Every day | RECTAL | 0 refills | Status: DC | PRN

## 2018-04-06 NOTE — Discharge Summary (Signed)
[facility]  Physician Discharge Summary   Patient ID: [name] MRN: [id number] DOB/AGE: Jul 23, 1927 84 y.o.  Admit date: 03/29/2018 Discharge date: 04/06/2018  PCP: Melinda Bowen, MD  DISCHARGE DIAGNOSES:  Severe constipation, resolved  Chronic diastolic CHF, improved Atrial fibrillation with RVR, improved Chronic bronchiectasis with chronic Pseudomonas colonization Hypothyroidism Dysphagia Weight loss  RECOMMENDATIONS FOR OUTPATIENT FOLLOW UP: 1. Home health has been ordered 2. Patient has slightly higher O2 requirements at discharge 3. Recheck thyroid function tests in 3 to 4 weeks   DISCHARGE CONDITION: fair  Diet recommendation: As before  Filed Weights   04/04/18 0553 04/05/18 0308 04/06/18 0609  Weight: 47.8 kg 47 kg 49.8 kg    INITIAL HISTORY: 84yo F w/ a hx of diastolic CHF, CAD, COPD, pernicious anemia, anxiety, anddepression who was admitted with shortness of breath and leg edema, and found to be in atrial fibrillation with RVR at 134bpm. CT chest revealed evidence of congestive heart failure and bilateral pleural effusion with edema.  Consultations:  Cardiology  Procedures: Transthoracic echocardiogram Study Conclusions  - Left ventricle: The cavity size was normal. Systolic function was   mildly reduced. The estimated ejection fraction was in the range   of 45% to 50%. Wall motion was normal; there were no regional   wall motion abnormalities. - Aortic valve: There was mild regurgitation. Valve area (VTI):   1.48 cm^2. Valve area (Vmax): 1.47 cm^2. Valve area (Vmean): 1.52   cm^2. - Mitral valve: There was mild to moderate regurgitation. - Left atrium: The atrium was mildly dilated. - Right atrium: The atrium was mildly dilated. - Pulmonary arteries: Systolic pressure was mildly increased. PA   peak pressure: 33 mm Hg (S).   HOSPITAL COURSE:   Intractable nausea with dry heaving to be secondary to severe  constipation Patient was aggressively started on a bowel regimen.  She has been having regular bowel movements.  Feels better.  Nausea has improved.    Acute on chronic diastolic congestive heart failure Has diuresed well and now appears to be at dry weight.  Continue home medication regimen.  Outpatient follow-up with cardiology.  A. fib with RVR CHA2DS2-VASc at least 4.  This is a new diagnosis.  Rate improved with Cardizem.  Anticoagulation started by cardiology which will be continued at discharge.  Chronic Bronchiectasis without COPD - chronic Pseudomonas colonization  Clinically stable at this time w/ no acute exacerbation - has been quite difficult to manage in the outpatient settings as reflected in PCCM clinic notes.  She was ambulated with physical therapy and is thought to require higher level of oxygen than before.  She will be discharged on 4 L of O2.  Hypothyroidism Continue Levothyroxine  Dysphagia Reports having difficulty swallowing pills but declines being evaluated by speech therapy  Weight loss Patient reported at least 10-20 pound weight loss over the last 6 months - CT abdomen and pelvis 7/30 showed no acute abnormalities - Albumin appears within normal limits - pt is quite cachectic - TSH is quite elevated at 10.5, but FT4 is normal - suggest recheck of TSH in 4-6 weeks.  Further evaluation by PCP.  Overall stable.  Okay for discharge home today.     PERTINENT LABS:  The results of significant diagnostics from this hospitalization (including imaging, microbiology, ancillary and laboratory) are listed below for reference.    Microbiology: Recent Results (from the past 240 hour(s))  Urine culture     Status: Abnormal   Collection Time: 03/30/18  1:08 AM  Result Value Ref Range Status   Specimen Description URINE, RANDOM  Final   Special Requests NONE  Final   Culture (A)  Final    <10,000 COLONIES/mL INSIGNIFICANT GROWTH Performed at [facility], [place] [address], [place], [place] [address]    Report Status 03/31/2018 FINAL  Final  MRSA PCR Screening     Status: None   Collection Time: 03/30/18  2:22 AM  Result Value Ref Range Status   MRSA by PCR NEGATIVE NEGATIVE Final    Comment:        The GeneXpert MRSA Assay (FDA approved for NASAL specimens only), is one component of a comprehensive MRSA colonization surveillance program. It is not intended to diagnose MRSA infection nor to guide or monitor treatment for MRSA infections. Performed at [facility], [place] [address], [place],  [address]      Labs: Basic Metabolic Panel: Recent Labs  Lab 03/31/18 0254 04/01/18 0349 04/02/18 0448 04/03/18 0544 04/04/18 0359 04/06/18 0401  NA 139 139 139 139 138 137  K 3.2* 3.8 3.6 3.8 3.8 3.8  CL 94* 96* 91* 91* 90* 93*  CO2 34* 38* 38* 41* 40* 36*  GLUCOSE 101* 111* 120* 121* 91 109*  BUN 17 19 19  29* 30* 17  CREATININE 0.88 1.02* 1.05* 1.33* 1.07* 0.85  CALCIUM 8.6* 8.9 9.3 9.0 9.1 8.5*  MG 2.0  --   --   --   --  2.4  PHOS 4.5  --   --   --   --  3.0   Liver Function Tests: Recent Labs  Lab 03/31/18 0254 04/03/18 0544 04/04/18 0359 04/06/18 0401  AST  --  25 26 21   ALT  --  22 20 15   ALKPHOS  --  79 74 70  BILITOT  --  0.9 1.1 1.0  PROT  --  7.7 7.4 6.7  ALBUMIN 3.4* 3.5 3.5 3.2*    Recent Labs  Lab 04/04/18 0359  AMMONIA 50*   CBC: Recent Labs  Lab 03/31/18 0254 04/01/18 0349 04/02/18 0448 04/03/18 0544 04/04/18 0359 04/06/18 0401  WBC 7.1 7.7 10.0 9.4 8.9 8.9  NEUTROABS 4.6  --   --   --   --   --   HGB 12.5 12.2 14.1 13.7 13.5 12.5  HCT 39.6 38.8 44.4 43.4 42.8 39.2  MCV 99.2 98.2 98.2 98.9 99.3 98.5  PLT 183 178 197 186 175 174   BNP: BNP (last 3 results) Recent Labs    03/29/18 1612  BNP 604.9*     IMAGING STUDIES Ct Soft Tissue Neck W Contrast  Result Date: 03/29/2018 CLINICAL DATA:  Shortness of breath and dizziness, assess etiology within the neck. New  onset atrial fibrillation. EXAM: CT NECK WITH CONTRAST TECHNIQUE: Multidetector CT imaging of the neck was performed using the standard protocol following the bolus administration of intravenous contrast. CONTRAST:  158mL ISOVUE-370 IOPAMIDOL (ISOVUE-370) INJECTION 76% COMPARISON:  None. FINDINGS: PHARYNX AND LARYNX: Normal.  Widely patent airway. SALIVARY GLANDS: Normal. THYROID: Normal. LYMPH NODES: No lymphadenopathy by CT size criteria. VASCULAR: Moderate calcific atherosclerosis carotid bifurcations. Calcific atherosclerosis aortic arch. Prominent posterior paraspinal veins, non-specific. LIMITED INTRACRANIAL: Normal. VISUALIZED ORBITS: Status post bilateral ocular lens implants. MASTOIDS AND VISUALIZED PARANASAL SINUSES: Small RIGHT maxillary mucosal retention cyst without paranasal sinus air-fluid levels. SKELETON: Nonacute. Exaggerated cervical lordosis. Moderate to severe mid cervical facet arthropathy. Severe temporomandibular osteoarthrosis. Torus palatini. Torus mandibularis. Scattered dental caries and periapical  lucencies. Osteopenia. UPPER CHEST: Please see dedicated CT chest from same day, reported separately. OTHER: None. IMPRESSION: 1. No acute process in the neck.  Patent airway. Aortic Atherosclerosis (ICD10-I70.0). Electronically Signed   By: Elon Alas M.D.   On: 03/29/2018 19:19   Ct Angio Chest Pe W And/or Wo Contrast  Result Date: 03/29/2018 CLINICAL DATA:  New onset atrial fibrillation and lower extremity swelling. EXAM: CT ANGIOGRAPHY CHEST WITH CONTRAST TECHNIQUE: Multidetector CT imaging of the chest was performed using the standard protocol during bolus administration of intravenous contrast. Multiplanar CT image reconstructions and MIPs were obtained to evaluate the vascular anatomy. CONTRAST:  164mL ISOVUE-370 IOPAMIDOL (ISOVUE-370) INJECTION 76% COMPARISON:  Chest x-ray from earlier in the same day, MRI from 09/14/2017 FINDINGS: Cardiovascular: Thoracic aorta and its  branches demonstrate atherosclerotic change without evidence of aneurysmal dilatation. Mild mitral calcifications are seen. Coronary calcifications are noted as well. The pulmonary artery is well visualize with a normal branching pattern. No filling defects are seen to suggest pulmonary embolism. Mediastinum/Nodes: Thoracic inlet is within normal limits. No sizable mediastinal adenopathy is noted. The esophagus is within normal limits. Lungs/Pleura: Lungs are hyperinflated consistent with COPD. There are changes of bronchiectasis particularly in the lingula and right middle lobe. Some loculated fluid is noted adjacent to the inferior aspect of the right upper lobe and right middle lobe. Some mild changes of pulmonary edema are seen. Mild atelectatic changes are noted in the bases bilaterally although no focal confluent infiltrate is seen. Large bilateral pleural effusions are noted right greater than left. No sizable parenchymal nodules are seen. Upper Abdomen: Visualized upper abdomen is within normal limits. Musculoskeletal: Osseous structures again demonstrate a the lytic and somewhat expansile lesion in the posterior aspect of T 11 extending into the left pedicle consistent with atypical hemangioma stable from prior MRI examination from 09/14/2017 as well as a prior study from 2013. degenerative changes are noted as well as scattered other hemangiomas. Review of the MIP images confirms the above findings. IMPRESSION: No evidence of pulmonary emboli. Changes of CHF with bilateral pleural effusions and pulmonary edema. Chronic changes of bronchiectasis and COPD. Chronic changes in T11 consistent with the given clinical history of atypical hemangioma stable from 2013. Aortic Atherosclerosis (ICD10-I70.0) and Emphysema (ICD10-J43.9). Electronically Signed   By: Inez Catalina M.D.   On: 03/29/2018 19:24   Dg Chest Port 1 View  Result Date: 03/29/2018 CLINICAL DATA:  Pt sent from dr's office for new onset afib and  BLE edema. Pt denies chest pain, but endorses, SOB, and dizziness. hx of CHF, COPD, CAD, irregular heartbeat. EXAM: PORTABLE CHEST 1 VIEW COMPARISON:  09/21/2017 FINDINGS: The patient is rotated towards the RIGHT. There are bilateral pleural effusions. Significant bronchitic changes are stable and probably chronic. There has been an increase in perihilar and bibasilar opacity, consistent with edema. No consolidations. IMPRESSION: Increased bilateral pleural effusions, perihilar and basilar opacities consistent with edema superimposed on chronic lung disease. Electronically Signed   By: Nolon Nations M.D.   On: 03/29/2018 16:34   Dg Abd Portable 1v  Result Date: 04/04/2018 CLINICAL DATA:  Abdominal pain.  Possible ileus. EXAM: PORTABLE ABDOMEN - 1 VIEW COMPARISON:  None. FINDINGS: The bowel gas pattern is unremarkable. Prominent stool in the rectosigmoid colon is noted. No abnormal abdominal calcification or focal bony abnormality. IMPRESSION: Negative for ileus or other acute abnormality. Large rectosigmoid stool burden. Electronically Signed   By: Inge Rise M.D.   On: 04/04/2018 12:32   Xr C-arm  No Report  Result Date: 03/21/2018 Please see Notes tab for imaging impression.   DISCHARGE EXAMINATION: Vitals:   04/06/18 0609 04/06/18 0835 04/06/18 0923 04/06/18 1154  BP: (!) 154/89 130/80  (!) 120/105  Pulse: (!) 127 (!) 105  100  Resp: 19 19  19   Temp: (!) 97.5 F (36.4 C) 97.7 F (36.5 C)  97.6 F (36.4 C)  TempSrc: Oral Oral  Oral  SpO2: 100% 100% 100% 100%  Weight: 49.8 kg     Height:       General appearance: alert, cooperative, appears stated age and no distress Resp: Normal effort at rest.  Mildly diminished air entry at the bases.  No crackles wheezing or rhonchi. Cardio: S1-S2 is irregularly irregular GI: soft, non-tender; bowel sounds normal; no masses,  no organomegaly  DISPOSITION: Home with home health  Discharge Instructions    (HEART FAILURE PATIENTS) Call MD:   Anytime you have any of the following symptoms: 1) 3 pound weight gain in 24 hours or 5 pounds in 1 week 2) shortness of breath, with or without a dry hacking cough 3) swelling in the hands, feet or stomach 4) if you have to sleep on extra pillows at night in order to breathe.   Complete by:  As directed    Call MD for:  extreme fatigue   Complete by:  As directed    Call MD for:  persistant dizziness or light-headedness   Complete by:  As directed    Call MD for:  persistant nausea and vomiting   Complete by:  As directed    Call MD for:  severe uncontrolled pain   Complete by:  As directed    Call MD for:  temperature >100.4   Complete by:  As directed    Diet - low sodium heart healthy   Complete by:  As directed    Discharge instructions   Complete by:  As directed    Please be sure to follow-up with your cardiologist within 1 to 2 weeks.  Please take your medications as prescribed.  You were cared for by a hospitalist during your hospital stay. If you have any questions about your discharge medications or the care you received while you were in the hospital after you are discharged, you can call the unit and asked to speak with the hospitalist on call if the hospitalist that took care of you is not available. Once you are discharged, your primary care physician will handle any further medical issues. Please note that NO REFILLS for any discharge medications will be authorized once you are discharged, as it is imperative that you return to your primary care physician (or establish a relationship with a primary care physician if you do not have one) for your aftercare needs so that they can reassess your need for medications and monitor your lab values. If you do not have a primary care physician, you can call [phone] for a physician referral.   Increase activity slowly   Complete by:  As directed         Allergies as of 04/06/2018      Reactions   Shrimp [shellfish Allergy] Nausea And  Vomiting, Swelling, Other (See Comments)   Made her run a fever also   Fish Allergy Nausea And Vomiting, Swelling, Other (See Comments)   Extremely sick   Adhesive [tape] Other (See Comments)   Tape BRUISES and TEARS THE SKIN; please use an alternative!!   Other Other (See Comments)  Does not remember which "mycin" drug = yeast infection No seeds or nuts = Digestive isues      Medication List    STOP taking these medications   Astaxanthin 4 MG Caps   Bilberry 150 MG Caps   Valerian 500 MG Caps     TAKE these medications   acetaminophen 500 MG tablet Commonly known as:  TYLENOL Take 2 tablets (1,000 mg total) by mouth every 8 (eight) hours. What changed:    when to take this  reasons to take this   albuterol 108 (90 Base) MCG/ACT inhaler Commonly known as:  PROVENTIL HFA;VENTOLIN HFA Inhale 2 puffs into the lungs every 6 (six) hours as needed. What changed:  reasons to take this   apixaban 2.5 MG Tabs tablet Commonly known as:  ELIQUIS Take 1 tablet (2.5 mg total) by mouth 2 (two) times daily.   bisacodyl 10 MG suppository Commonly known as:  DULCOLAX Place 1 suppository (10 mg total) rectally daily as needed for moderate constipation.   budesonide-formoterol 80-4.5 MCG/ACT inhaler Commonly known as:  SYMBICORT Inhale 2 puffs into the lungs 2 (two) times daily. What changed:    when to take this  reasons to take this   clorazepate 7.5 MG tablet Commonly known as:  TRANXENE Take 3.75 mg by mouth 2 (two) times daily as needed (for tightness in the stomach).   diltiazem 360 MG 24 hr capsule Commonly known as:  CARDIZEM CD Take 1 capsule (360 mg total) by mouth daily.   folic acid 194 MCG tablet Commonly known as:  FOLVITE Take 400 mcg by mouth daily.   furosemide 20 MG tablet Commonly known as:  LASIX TAKE ONE TABLET BY MOUTH ONCE DAILY   HYDROcodone-homatropine 5-1.5 MG/5ML syrup Commonly known as:  HYCODAN Take 5 mLs by mouth every 8 (eight) hours  as needed for cough.   hydrocortisone 25 MG suppository Commonly known as:  ANUSOL-HC Place 25 mg rectally as needed for hemorrhoids.   hydroxypropyl methylcellulose / hypromellose 2.5 % ophthalmic solution Commonly known as:  ISOPTO TEARS / GONIOVISC Place 1 drop into both eyes 3 (three) times daily as needed for dry eyes.   ICAPS AREDS 2 Caps Take 1 capsule by mouth 2 (two) times daily.   levothyroxine 75 MCG tablet Commonly known as:  SYNTHROID, LEVOTHROID Take 75 mcg by mouth daily.   magnesium hydroxide 400 MG/5ML suspension Commonly known as:  MILK OF MAGNESIA Take 5-15 mLs by mouth daily as needed for mild constipation.   Melatonin 10 MG Tabs Take 10 mg by mouth at bedtime as needed (for sleep).   nitroGLYCERIN 0.4 MG SL tablet Commonly known as:  NITROSTAT Place 0.4 mg under the tongue every 5 (five) minutes as needed for chest pain.   ondansetron 4 MG tablet Commonly known as:  ZOFRAN Take 1 tablet (4 mg total) by mouth every 8 (eight) hours as needed for nausea or vomiting.   OXYGEN Inhale 4 L into the lungs See admin instructions. As instructed   Papaya Chew Chew 2-3 tablets by mouth as needed (for nausea).   Probiotic Caps Take 1 capsule by mouth daily.   senna-docusate 8.6-50 MG tablet Commonly known as:  Senokot-S Take 1 tablet by mouth 2 (two) times daily.   sodium chloride HYPERTONIC 3 % nebulizer solution Take by nebulization 2 (two) times daily. What changed:    how much to take  when to take this  reasons to take this   SYSTANE 0.4-0.3 %  Soln Generic drug:  Polyethyl Glycol-Propyl Glycol Place 1 drop into both eyes at bedtime as needed (for dry eyes).   trimethoprim-polymyxin b ophthalmic solution Commonly known as:  POLYTRIM Place 1 drop into the left eye See admin instructions. Instill 1 drop into the left eye three times a day for the 1st day and 1 drop four times a day on the 2nd day AFTER INJECTION   vitamin B-12 1000 MCG  tablet Commonly known as:  CYANOCOBALAMIN Take 1,000 mcg by mouth daily.   vitamin C 500 MG tablet Commonly known as:  ASCORBIC ACID Take 500 mg by mouth daily.   Vitamin D3 1000 units Caps Take 1,000 Units by mouth daily.            Durable Medical Equipment  (From admission, onward)         Start     Ordered   04/06/18 1156  For home use only DME oxygen  Once    Question Answer Comment  Mode or (Route) Nasal cannula   Liters per Minute 4   Frequency Continuous (stationary and portable oxygen unit needed)   Oxygen conserving device Yes   Oxygen delivery system Gas      04/06/18 1155           Follow-up Information    [facility] Follow up.   Specialty:  Home Health Services Why:  Registered Nurse, Physical Therapy Contact information: [address] [place] [address] [phone]        Jacolyn Reedy, MD. Schedule an appointment as soon as possible for a visit in 1 week(s).   Specialty:  Cardiology Contact information: [address] [place] [address] [facility] [facility] up.   Why:  Oxygen increased liter flow.  Contact information: [address] [place] [address] [phone]           TOTAL DISCHARGE TIME: 35 mins  [facility] Pager [phone]  04/06/2018, 2:16 PM

## 2018-04-06 NOTE — Progress Notes (Signed)
SATURATION QUALIFICATIONS: (This note is used to comply with regulatory documentation for home oxygen)  Patient Saturations on Room Air at Rest = 93%  Patient Saturations on Room Air while Ambulating = 74%  Patient Saturations on 4 Liters of oxygen while Ambulating = 89%  Please briefly explain why patient needs home oxygen:Pt needed O2 to to keep sats up with ambulation.  Thanks.  [place] Pager:  [phone]  Office:  [phone]

## 2018-04-06 NOTE — Progress Notes (Signed)
Physical Therapy Treatment Patient Details Name: [name] MRN: [id number] DOB: Dec 17, 1926 Today's Date: 04/06/2018    History of Present Illness [name] is an 84 year old Caucasian female, with past medical history significant for diastolic CHF, CAD, COPD, pernicious anemia, anxiety, and depression.  Patient was admitted with shortness of breath, orthopnea, dyspnea on exertion, significant leg edema, chronic cough and atrial fibrillation with rapid ventricular response    PT Comments    Pt admitted with above diagnosis. Pt currently with functional limitations due to balance and endurance deficits. Pt was able to ambulate with min guard assist short distance with RW.  Desat on RA and needed 4LO2 to keep sats Above 88%.  Pt will benefit from skilled PT to increase their independence and safety with mobility to allow discharge to the venue listed below.    SATURATION QUALIFICATIONS: (This note is used to comply with regulatory documentation for home oxygen)  Patient Saturations on Room Air at Rest = 93%  Patient Saturations on Room Air while Ambulating = 74%  Patient Saturations on 4 Liters of oxygen while Ambulating = 89%  Please briefly explain why patient needs home oxygen:Pt needed O2 to to keep sats up with ambulation.    Follow Up Recommendations  Home health PT;Supervision/Assistance - 24 hour(pt refusing SNF )     Equipment Recommendations  None recommended by PT , home O2   Recommendations for Other Services       Precautions / Restrictions Precautions Precautions: Fall Restrictions Weight Bearing Restrictions: No    Mobility  Bed Mobility Overal bed mobility: Modified Independent                Transfers Overall transfer level: Needs assistance Equipment used: Rolling walker (2 wheeled) Transfers: Sit to/from Stand Sit to Stand: Min assist         General transfer comment: Cues for hand placement. Min A for steadying. Incr time  for pt to come to stanidng.   Ambulation/Gait Ambulation/Gait assistance: Min guard Gait Distance (Feet): 60 Feet(30 feet x 2) Assistive device: Rolling walker (2 wheeled) Gait Pattern/deviations: Step-through pattern;Trunk flexed;Decreased stride length Gait velocity: decreased Gait velocity interpretation: <1.31 ft/sec, indicative of household ambulator General Gait Details: Slow, guarded gait. Oxygen sats decreasing to 74% on RA, however, increased back to 88% with 4LO2.    Stairs             Wheelchair Mobility    Modified Rankin (Stroke Patients Only)       Balance Overall balance assessment: Needs assistance Sitting-balance support: No upper extremity supported Sitting balance-Leahy Scale: Good     Standing balance support: Bilateral upper extremity supported Standing balance-Leahy Scale: Poor Standing balance comment: Reliant on BUE support                             Cognition Arousal/Alertness: Awake/alert Behavior During Therapy: WFL for tasks assessed/performed Overall Cognitive Status: Within Functional Limits for tasks assessed                                        Exercises      General Comments        Pertinent Vitals/Pain Pain Assessment: No/denies pain    Home Living  Prior Function            PT Goals (current goals can now be found in the care plan section) Acute Rehab PT Goals Patient Stated Goal: go home when ready Progress towards PT goals: Progressing toward goals    Frequency    Min 3X/week      PT Plan Current plan remains appropriate    Co-evaluation              AM-PAC PT "6 Clicks" Daily Activity  Outcome Measure  Difficulty turning over in bed (including adjusting bedclothes, sheets and blankets)?: None Difficulty moving from lying on back to sitting on the side of the bed? : None Difficulty sitting down on and standing up from a chair with  arms (e.g., wheelchair, bedside commode, etc,.)?: A Little Help needed moving to and from a bed to chair (including a wheelchair)?: A Little Help needed walking in hospital room?: A Little Help needed climbing 3-5 steps with a railing? : A Lot 6 Click Score: 19    End of Session Equipment Utilized During Treatment: Gait belt;Oxygen Activity Tolerance: Patient limited by fatigue Patient left: in bed;with family/visitor present;with call bell/phone within reach Nurse Communication: Mobility status PT Visit Diagnosis: Unsteadiness on feet (R26.81);Other abnormalities of gait and mobility (R26.89);Muscle weakness (generalized) (M62.81)     Time: 9373-4287 PT Time Calculation (min) (ACUTE ONLY): 29 min  Charges:  $Gait Training: 23-37 mins                     [place] Pager:  [phone]  Office:  [place] 04/06/2018, 1:47 PM

## 2018-04-07 DIAGNOSIS — G6289 Other specified polyneuropathies: Secondary | ICD-10-CM | POA: Diagnosis not present

## 2018-04-07 DIAGNOSIS — Z681 Body mass index (BMI) 19 or less, adult: Secondary | ICD-10-CM | POA: Diagnosis not present

## 2018-04-07 DIAGNOSIS — I2510 Atherosclerotic heart disease of native coronary artery without angina pectoris: Secondary | ICD-10-CM | POA: Diagnosis not present

## 2018-04-07 DIAGNOSIS — I5033 Acute on chronic diastolic (congestive) heart failure: Secondary | ICD-10-CM | POA: Diagnosis not present

## 2018-04-07 DIAGNOSIS — M1990 Unspecified osteoarthritis, unspecified site: Secondary | ICD-10-CM | POA: Diagnosis not present

## 2018-04-07 DIAGNOSIS — M810 Age-related osteoporosis without current pathological fracture: Secondary | ICD-10-CM | POA: Diagnosis not present

## 2018-04-07 DIAGNOSIS — I4891 Unspecified atrial fibrillation: Secondary | ICD-10-CM | POA: Diagnosis not present

## 2018-04-07 DIAGNOSIS — F419 Anxiety disorder, unspecified: Secondary | ICD-10-CM | POA: Diagnosis not present

## 2018-04-07 DIAGNOSIS — D510 Vitamin B12 deficiency anemia due to intrinsic factor deficiency: Secondary | ICD-10-CM | POA: Diagnosis not present

## 2018-04-07 DIAGNOSIS — F329 Major depressive disorder, single episode, unspecified: Secondary | ICD-10-CM | POA: Diagnosis not present

## 2018-04-07 DIAGNOSIS — J439 Emphysema, unspecified: Secondary | ICD-10-CM | POA: Diagnosis not present

## 2018-04-07 DIAGNOSIS — F418 Other specified anxiety disorders: Secondary | ICD-10-CM | POA: Diagnosis not present

## 2018-04-07 DIAGNOSIS — M797 Fibromyalgia: Secondary | ICD-10-CM | POA: Diagnosis not present

## 2018-04-07 DIAGNOSIS — J479 Bronchiectasis, uncomplicated: Secondary | ICD-10-CM | POA: Diagnosis not present

## 2018-04-07 DIAGNOSIS — R634 Abnormal weight loss: Secondary | ICD-10-CM | POA: Diagnosis not present

## 2018-04-07 DIAGNOSIS — G629 Polyneuropathy, unspecified: Secondary | ICD-10-CM | POA: Diagnosis not present

## 2018-04-07 DIAGNOSIS — Z96641 Presence of right artificial hip joint: Secondary | ICD-10-CM | POA: Diagnosis not present

## 2018-04-07 DIAGNOSIS — I700 Atherosclerosis of aorta: Secondary | ICD-10-CM | POA: Diagnosis not present

## 2018-04-11 DIAGNOSIS — R634 Abnormal weight loss: Secondary | ICD-10-CM | POA: Diagnosis not present

## 2018-04-11 DIAGNOSIS — I5033 Acute on chronic diastolic (congestive) heart failure: Secondary | ICD-10-CM | POA: Diagnosis not present

## 2018-04-11 DIAGNOSIS — J479 Bronchiectasis, uncomplicated: Secondary | ICD-10-CM | POA: Diagnosis not present

## 2018-04-11 DIAGNOSIS — I2510 Atherosclerotic heart disease of native coronary artery without angina pectoris: Secondary | ICD-10-CM | POA: Diagnosis not present

## 2018-04-11 DIAGNOSIS — I4891 Unspecified atrial fibrillation: Secondary | ICD-10-CM | POA: Diagnosis not present

## 2018-04-11 DIAGNOSIS — J439 Emphysema, unspecified: Secondary | ICD-10-CM | POA: Diagnosis not present

## 2018-04-12 DIAGNOSIS — I2510 Atherosclerotic heart disease of native coronary artery without angina pectoris: Secondary | ICD-10-CM | POA: Diagnosis not present

## 2018-04-12 DIAGNOSIS — R634 Abnormal weight loss: Secondary | ICD-10-CM | POA: Diagnosis not present

## 2018-04-12 DIAGNOSIS — J479 Bronchiectasis, uncomplicated: Secondary | ICD-10-CM | POA: Diagnosis not present

## 2018-04-12 DIAGNOSIS — J439 Emphysema, unspecified: Secondary | ICD-10-CM | POA: Diagnosis not present

## 2018-04-12 DIAGNOSIS — I4891 Unspecified atrial fibrillation: Secondary | ICD-10-CM | POA: Diagnosis not present

## 2018-04-12 DIAGNOSIS — I5033 Acute on chronic diastolic (congestive) heart failure: Secondary | ICD-10-CM | POA: Diagnosis not present

## 2018-04-13 ENCOUNTER — Other Ambulatory Visit: Payer: Self-pay | Admitting: Gastroenterology

## 2018-04-13 DIAGNOSIS — I4891 Unspecified atrial fibrillation: Secondary | ICD-10-CM | POA: Diagnosis not present

## 2018-04-13 DIAGNOSIS — J449 Chronic obstructive pulmonary disease, unspecified: Secondary | ICD-10-CM | POA: Diagnosis not present

## 2018-04-13 DIAGNOSIS — E038 Other specified hypothyroidism: Secondary | ICD-10-CM | POA: Diagnosis not present

## 2018-04-13 DIAGNOSIS — K5900 Constipation, unspecified: Secondary | ICD-10-CM | POA: Diagnosis not present

## 2018-04-13 DIAGNOSIS — I5031 Acute diastolic (congestive) heart failure: Secondary | ICD-10-CM | POA: Diagnosis not present

## 2018-04-13 DIAGNOSIS — J479 Bronchiectasis, uncomplicated: Secondary | ICD-10-CM | POA: Diagnosis not present

## 2018-04-13 DIAGNOSIS — R1310 Dysphagia, unspecified: Secondary | ICD-10-CM | POA: Diagnosis not present

## 2018-04-13 DIAGNOSIS — R634 Abnormal weight loss: Secondary | ICD-10-CM | POA: Diagnosis not present

## 2018-04-13 DIAGNOSIS — Z681 Body mass index (BMI) 19 or less, adult: Secondary | ICD-10-CM | POA: Diagnosis not present

## 2018-04-13 DIAGNOSIS — I509 Heart failure, unspecified: Secondary | ICD-10-CM | POA: Diagnosis not present

## 2018-04-14 DIAGNOSIS — R634 Abnormal weight loss: Secondary | ICD-10-CM | POA: Diagnosis not present

## 2018-04-14 DIAGNOSIS — J479 Bronchiectasis, uncomplicated: Secondary | ICD-10-CM | POA: Diagnosis not present

## 2018-04-14 DIAGNOSIS — I5033 Acute on chronic diastolic (congestive) heart failure: Secondary | ICD-10-CM | POA: Diagnosis not present

## 2018-04-14 DIAGNOSIS — I4891 Unspecified atrial fibrillation: Secondary | ICD-10-CM | POA: Diagnosis not present

## 2018-04-14 DIAGNOSIS — J439 Emphysema, unspecified: Secondary | ICD-10-CM | POA: Diagnosis not present

## 2018-04-14 DIAGNOSIS — I2510 Atherosclerotic heart disease of native coronary artery without angina pectoris: Secondary | ICD-10-CM | POA: Diagnosis not present

## 2018-04-15 ENCOUNTER — Other Ambulatory Visit: Payer: Self-pay | Admitting: Registered Nurse

## 2018-04-15 DIAGNOSIS — R140 Abdominal distension (gaseous): Secondary | ICD-10-CM

## 2018-04-19 ENCOUNTER — Encounter: Payer: Self-pay | Admitting: Neurology

## 2018-04-19 ENCOUNTER — Ambulatory Visit (INDEPENDENT_AMBULATORY_CARE_PROVIDER_SITE_OTHER): Payer: Medicare Other | Admitting: Neurology

## 2018-04-19 VITALS — BP 110/70 | HR 76 | Wt 112.0 lb

## 2018-04-19 DIAGNOSIS — G609 Hereditary and idiopathic neuropathy, unspecified: Secondary | ICD-10-CM | POA: Diagnosis not present

## 2018-04-19 MED ORDER — DULOXETINE HCL 30 MG PO CPEP: 30.0000 mg | ORAL_CAPSULE | Freq: Every day | ORAL | 3 refills | Status: DC

## 2018-04-19 NOTE — Patient Instructions (Signed)
We will start Cymbalta for the pain in the feet, 30 mg at night. Call for any dose adjustments.   Cymbalta (duloxetine) is an antidepressant medication that is commonly used for peripheral neuropathy pain or for fibromyalgia pain. As with any antidepressant medication, worsening depression can be seen. This medication can potentially cause headache, dizziness, sexual dysfunction, or nausea. If any problems are noted on this medication, please contact our office.

## 2018-04-19 NOTE — Progress Notes (Signed)
Reason for visit: Peripheral neuropathy, low back pain  [name] is an 84 y.o. female  History of present illness:  [name] is an 84 year old right-handed white female with a history of a peripheral neuropathy.  The patient has recently been in the hospital on 29 March 2018 with atrial fibrillation with rapid ventricular response, and some congestive heart failure.  She is now on anticoagulation.  The patient in the past has gotten two epidural injections, one in March and one in April 2019 without any benefit.  The patient had been on gabapentin, she was unable to tolerate the 300 mg nightly dose, she has gone off of gabapentin completely and does not believe that she has had any worsening of her pain.  The patient reports a lot of discomfort in the heels at times.  This is worse at night.  She has not had any falls, she uses a walker to get around.  She returns for an evaluation.  Past Medical History:  Diagnosis Date  . Anxiety   . Arthritis   . CHF (congestive heart failure) ([place])   . Complication of anesthesia    " I have to have a spinal beacuse my lungs & heart are bad "  . COPD (chronic obstructive pulmonary disease) ([place])   . Coronary artery disease   . Depression   . Fibromyalgia   . Hiatal hernia   . Hyperlipidemia   . Irregular heartbeat   . On home oxygen therapy    "2L at night and prn" (03/29/2018)  . Osteoporosis   . Peripheral neuropathy 08/30/2017  . Rotator cuff tear    RIGHT  . UTI (urinary tract infection) 09/2014    Past Surgical History:  Procedure Laterality Date  . APPENDECTOMY    . CATARACT EXTRACTION Left   . DILATION AND CURETTAGE OF UTERUS     x2  . ELBOW SURGERY     Right  . HIP ARTHROPLASTY Right 09/22/2014   Procedure: ARTHROPLASTY BIPOLAR HIP;  Surgeon: [name], MD;  Location: [facility];  Service: Orthopedics;  Laterality: Right;  . TONSILLECTOMY    . TOTAL HIP REVISION Right 10/13/2014   Procedure: REVISION RIGHT TOTAL  HIP POSTERIOR ;  Surgeon: Rod Can, MD;  Location: [place];  Service: Orthopedics;  Laterality: Right;    Family History  Problem Relation Age of Onset  . Pancreatic cancer Father   . Heart failure Mother   . Hypertension Mother   . Heart attack Brother   . Hypertension Brother   . Stroke Brother   . Heart attack Sister   . Diabetes Son   . Obesity Son   . Heart attack Son     Social history:  reports that she has never smoked. She has never used smokeless tobacco. She reports that she does not drink alcohol or use drugs.    Allergies  Allergen Reactions  . Shrimp [Shellfish Allergy] Nausea And Vomiting, Swelling and Other (See Comments)    Made her run a fever also  . Fish Allergy Nausea And Vomiting, Swelling and Other (See Comments)    Extremely sick  . Adhesive [Tape] Other (See Comments)    Tape BRUISES and TEARS THE SKIN; please use an alternative!!  . Other Other (See Comments)    Does not remember which "mycin" drug = yeast infection No seeds or nuts = Digestive isues    Medications:  Prior to Admission medications   Medication Sig Start Date  End Date Taking? Authorizing Provider  acetaminophen (TYLENOL) 500 MG tablet Take 2 tablets (1,000 mg total) by mouth every 8 (eight) hours. Patient taking differently: Take 1,000 mg by mouth every 8 (eight) hours as needed for mild pain or headache.  10/15/14  Yes Swinteck, Aaron Edelman, MD  albuterol (PROVENTIL HFA;VENTOLIN HFA) 108 (90 Base) MCG/ACT inhaler Inhale 2 puffs into the lungs every 6 (six) hours as needed. Patient taking differently: Inhale 2 puffs into the lungs every 6 (six) hours as needed for wheezing or shortness of breath.  01/21/17  Yes Juanito Doom, MD  apixaban (ELIQUIS) 2.5 MG TABS tablet Take 1 tablet (2.5 mg total) by mouth 2 (two) times daily. 04/06/18  Yes Bonnielee Haff, MD  bisacodyl (DULCOLAX) 10 MG suppository Place 1 suppository (10 mg total) rectally daily as needed for moderate constipation.  04/06/18  Yes Bonnielee Haff, MD  budesonide-formoterol [facility]) 80-4.5 MCG/ACT inhaler Inhale 2 puffs into the lungs 2 (two) times daily. Patient taking differently: Inhale 2 puffs into the lungs 2 (two) times daily as needed (for "flares").  09/21/17  Yes Tanda Rockers, MD  Cholecalciferol (VITAMIN D3) 1000 UNITS CAPS Take 1,000 Units by mouth daily.    Yes [provider]  clorazepate (TRANXENE) 7.5 MG tablet Take 3.75 mg by mouth 2 (two) times daily as needed (for tightness in the stomach).    Yes [provider]  diltiazem (CARDIZEM CD) 360 MG 24 hr capsule Take 1 capsule (360 mg total) by mouth daily. 04/06/18  Yes Bonnielee Haff, MD  folic acid (FOLVITE) 270 MCG tablet Take 400 mcg by mouth daily.   Yes [provider]  furosemide (LASIX) 20 MG tablet TAKE ONE TABLET BY MOUTH ONCE DAILY 03/19/16  Yes Mannam, Praveen, MD  HYDROcodone-homatropine (HYCODAN) 5-1.5 MG/5ML syrup Take 5 mLs by mouth every 8 (eight) hours as needed for cough. 02/14/16  Yes Mannam, Praveen, MD  hydrocortisone (ANUSOL-HC) 25 MG suppository Place 25 mg rectally as needed for hemorrhoids.    Yes [provider]  hydroxypropyl methylcellulose (ISOPTO TEARS) 2.5 % ophthalmic solution Place 1 drop into both eyes 3 (three) times daily as needed for dry eyes.    Yes [provider]  levothyroxine (SYNTHROID, LEVOTHROID) 75 MCG tablet Take 75 mcg by mouth daily.     Yes [provider]  magnesium hydroxide (MILK OF MAGNESIA) 400 MG/5ML suspension Take 5-15 mLs by mouth daily as needed for mild constipation.    Yes [provider]  Melatonin 10 MG TABS Take 10 mg by mouth at bedtime as needed (for sleep).    Yes [provider]  Multiple Vitamins-Minerals (ICAPS AREDS 2) CAPS Take 1 capsule by mouth 2 (two) times daily.    Yes [provider]  nitroGLYCERIN (NITROSTAT) 0.4 MG SL tablet Place 0.4 mg under the tongue every 5 (five) minutes as needed  for chest pain.    Yes [provider]  ondansetron (ZOFRAN) 4 MG tablet TAKE 1 TABLET BY MOUTH EVERY 8 HOURS AS NEEDED FOR NAUSEA OR VOMITING 04/14/18  Yes Zehr, Janett Billow D, PA-C  OXYGEN Inhale 4 L into the lungs See admin instructions. As instructed   Yes [provider]  Papaya CHEW Chew 2-3 tablets by mouth as needed (for nausea).   Yes [provider]  Polyethyl Glycol-Propyl Glycol (SYSTANE) 0.4-0.3 % SOLN Place 1 drop into both eyes at bedtime as needed (for dry eyes).    Yes [provider]  Probiotic CAPS Take  1 capsule by mouth daily.   Yes [provider]  senna-docusate (SENOKOT-S) 8.6-50 MG tablet Take 1 tablet by mouth 2 (two) times daily. 04/06/18  Yes Bonnielee Haff, MD  sodium chloride HYPERTONIC 3 % nebulizer solution Take by nebulization 2 (two) times daily. Patient taking differently: Take 4 mLs by nebulization 2 (two) times daily as needed (for Bronchiectasis-related symptoms).  01/21/17  Yes Juanito Doom, MD  trimethoprim-polymyxin b (POLYTRIM) ophthalmic solution Place 1 drop into the left eye See admin instructions. Instill 1 drop into the left eye three times a day for the 1st day and 1 drop four times a day on the 2nd day AFTER INJECTION 03/14/18  Yes [provider]  vitamin B-12 (CYANOCOBALAMIN) 1000 MCG tablet Take 1,000 mcg by mouth daily.   Yes [provider]  vitamin C (ASCORBIC ACID) 500 MG tablet Take 500 mg by mouth daily.   Yes [provider]    ROS:  Out of a complete 14 system review of symptoms, the patient complains only of the following symptoms, and all other reviewed systems are negative.  Leg pain, low back pain   Blood pressure 110/70, pulse 76, weight 112 lb (50.8 kg), SpO2 98 %.  Physical Exam  General: The patient is alert and cooperative at the time of the examination.  Skin: 1+ edema at the ankles is noted bilaterally.   Neurologic Exam  Mental status: The  patient is alert and oriented x 3 at the time of the examination. The patient has apparent normal recent and remote memory, with an apparently normal attention span and concentration ability.   Cranial nerves: Facial symmetry is present. Speech is normal, no aphasia or dysarthria is noted. Extraocular movements are full. Visual fields are full.  The patient has some impairment of vision.  Motor: The patient has good strength in all 4 extremities.  The patient has difficulty elevating the right arm.  Sensory examination: Soft touch sensation is symmetric on the face, arms, and legs.  Coordination: The patient has good finger-nose-finger and heel-to-shin bilaterally.  Gait and station: The patient has a slightly wide-based gait, the patient is able to walk with a walker.  Romberg is negative.  Reflexes: Deep tendon reflexes are symmetric.   Assessment/Plan:  1.  Chronic low back pain  2.  Peripheral neuropathy  The patient does have a gait disturbance, she is being safe with a walker.  She will be placed on Cymbalta 30 mg at night, she currently is off of gabapentin.  A prescription for Cymbalta was sent in.  She will call for any dose adjustments.  She will follow-up in 6 months.  Jill Alexanders MD 04/19/2018 11:40 AM  [facility] [address] [place], [place] [address]  Phone [phone] Fax [phone]

## 2018-04-20 ENCOUNTER — Ambulatory Visit: Payer: Medicare Other | Admitting: Gastroenterology

## 2018-04-20 DIAGNOSIS — I5033 Acute on chronic diastolic (congestive) heart failure: Secondary | ICD-10-CM | POA: Diagnosis not present

## 2018-04-20 DIAGNOSIS — I4891 Unspecified atrial fibrillation: Secondary | ICD-10-CM | POA: Diagnosis not present

## 2018-04-20 DIAGNOSIS — I2510 Atherosclerotic heart disease of native coronary artery without angina pectoris: Secondary | ICD-10-CM | POA: Diagnosis not present

## 2018-04-20 DIAGNOSIS — R634 Abnormal weight loss: Secondary | ICD-10-CM | POA: Diagnosis not present

## 2018-04-20 DIAGNOSIS — J439 Emphysema, unspecified: Secondary | ICD-10-CM | POA: Diagnosis not present

## 2018-04-20 DIAGNOSIS — J479 Bronchiectasis, uncomplicated: Secondary | ICD-10-CM | POA: Diagnosis not present

## 2018-04-22 DIAGNOSIS — J479 Bronchiectasis, uncomplicated: Secondary | ICD-10-CM | POA: Diagnosis not present

## 2018-04-22 DIAGNOSIS — I4891 Unspecified atrial fibrillation: Secondary | ICD-10-CM | POA: Diagnosis not present

## 2018-04-22 DIAGNOSIS — I2510 Atherosclerotic heart disease of native coronary artery without angina pectoris: Secondary | ICD-10-CM | POA: Diagnosis not present

## 2018-04-22 DIAGNOSIS — I5033 Acute on chronic diastolic (congestive) heart failure: Secondary | ICD-10-CM | POA: Diagnosis not present

## 2018-04-22 DIAGNOSIS — J439 Emphysema, unspecified: Secondary | ICD-10-CM | POA: Diagnosis not present

## 2018-04-22 DIAGNOSIS — R634 Abnormal weight loss: Secondary | ICD-10-CM | POA: Diagnosis not present

## 2018-04-25 ENCOUNTER — Ambulatory Visit
Admission: RE | Admit: 2018-04-25 | Discharge: 2018-04-25 | Disposition: A | Discharge status: Home / Self Care | Payer: Medicare Other | Source: Ambulatory Visit | Attending: Registered Nurse | Admitting: Registered Nurse

## 2018-04-25 DIAGNOSIS — R140 Abdominal distension (gaseous): Secondary | ICD-10-CM

## 2018-04-25 DIAGNOSIS — K7689 Other specified diseases of liver: Secondary | ICD-10-CM | POA: Diagnosis not present

## 2018-04-25 DIAGNOSIS — I5033 Acute on chronic diastolic (congestive) heart failure: Secondary | ICD-10-CM | POA: Diagnosis not present

## 2018-04-25 DIAGNOSIS — J439 Emphysema, unspecified: Secondary | ICD-10-CM | POA: Diagnosis not present

## 2018-04-25 DIAGNOSIS — I2510 Atherosclerotic heart disease of native coronary artery without angina pectoris: Secondary | ICD-10-CM | POA: Diagnosis not present

## 2018-04-25 DIAGNOSIS — J479 Bronchiectasis, uncomplicated: Secondary | ICD-10-CM | POA: Diagnosis not present

## 2018-04-25 DIAGNOSIS — I4891 Unspecified atrial fibrillation: Secondary | ICD-10-CM | POA: Diagnosis not present

## 2018-04-25 DIAGNOSIS — R634 Abnormal weight loss: Secondary | ICD-10-CM | POA: Diagnosis not present

## 2018-04-27 ENCOUNTER — Other Ambulatory Visit (INDEPENDENT_AMBULATORY_CARE_PROVIDER_SITE_OTHER): Payer: Medicare Other

## 2018-04-27 ENCOUNTER — Ambulatory Visit (INDEPENDENT_AMBULATORY_CARE_PROVIDER_SITE_OTHER): Payer: Medicare Other | Admitting: Gastroenterology

## 2018-04-27 ENCOUNTER — Encounter: Payer: Self-pay | Admitting: Gastroenterology

## 2018-04-27 VITALS — BP 102/58 | HR 72 | Ht 67.0 in | Wt 108.0 lb

## 2018-04-27 DIAGNOSIS — R634 Abnormal weight loss: Secondary | ICD-10-CM

## 2018-04-27 DIAGNOSIS — R630 Anorexia: Secondary | ICD-10-CM

## 2018-04-27 DIAGNOSIS — F509 Eating disorder, unspecified: Secondary | ICD-10-CM | POA: Diagnosis not present

## 2018-04-27 DIAGNOSIS — K5900 Constipation, unspecified: Secondary | ICD-10-CM

## 2018-04-27 DIAGNOSIS — R112 Nausea with vomiting, unspecified: Secondary | ICD-10-CM

## 2018-04-27 LAB — H. PYLORI ANTIBODY, IGG: H PYLORI IGG: NEGATIVE

## 2018-04-27 NOTE — Progress Notes (Signed)
04/27/2018 [name] [id number] 1927-04-08   HISTORY OF PRESENT ILLNESS:  This is an 84 year old female who is a patient of Dr. Doyne Keel.  She was seen by me in June for complaints of nausea, vomiting, weight loss, and bloating.  CT scan abdomen and pelvis with contrast in July was ordered no abnormalities to account for her symptoms.  Then CT angio of the chest in August showed changes c/w CHF and COPD but otherwise no sign of malignancy.  RUQ abdominal ultrasound showed no gallstones, slightly thickened gallbladder wall questionable related to increased right heart pressure and small right-sided pleural effusion.  She is here today with the same complaints.  Her weight is down to 108.  She admits that she does not eat much as she does not have an appetite.  Does not take anything regularly for her bowel movements and sometimes goes several days without BM's.   Past Medical History:  Diagnosis Date  . Anxiety   . Arthritis   . CHF (congestive heart failure) ([place])   . Complication of anesthesia    " I have to have a spinal beacuse my lungs & heart are bad "  . COPD (chronic obstructive pulmonary disease) ([place])   . Coronary artery disease   . Depression   . Fibromyalgia   . Hiatal hernia   . Hyperlipidemia   . Irregular heartbeat   . On home oxygen therapy    "2L at night and prn" (03/29/2018)  . Osteoporosis   . Peripheral neuropathy 08/30/2017  . Rotator cuff tear    RIGHT  . UTI (urinary tract infection) 09/2014   Past Surgical History:  Procedure Laterality Date  . APPENDECTOMY    . CATARACT EXTRACTION Left   . DILATION AND CURETTAGE OF UTERUS     x2  . ELBOW SURGERY     Right  . HIP ARTHROPLASTY Right 09/22/2014   Procedure: ARTHROPLASTY BIPOLAR HIP;  Surgeon: [name], MD;  Location: [place];  Service: Orthopedics;  Laterality: Right;  . TONSILLECTOMY    . TOTAL HIP REVISION Right 10/13/2014   Procedure: REVISION RIGHT TOTAL HIP POSTERIOR ;  Surgeon:  Rod Can, MD;  Location: [place];  Service: Orthopedics;  Laterality: Right;    reports that she has never smoked. She has never used smokeless tobacco. She reports that she does not drink alcohol or use drugs. family history includes Diabetes in her son; Heart attack in her brother, sister, and son; Heart failure in her mother; Hypertension in her brother and mother; Obesity in her son; Pancreatic cancer in her father; Stroke in her brother. Allergies  Allergen Reactions  . Shrimp [Shellfish Allergy] Nausea And Vomiting, Swelling and Other (See Comments)    Made her run a fever also  . Fish Allergy Nausea And Vomiting, Swelling and Other (See Comments)    Extremely sick  . Adhesive [Tape] Other (See Comments)    Tape BRUISES and TEARS THE SKIN; please use an alternative!!  . Other Other (See Comments)    Does not remember which "mycin" drug = yeast infection No seeds or nuts = Digestive isues      Outpatient Encounter Medications as of 04/27/2018  Medication Sig  . acetaminophen (TYLENOL) 500 MG tablet Take 2 tablets (1,000 mg total) by mouth every 8 (eight) hours. (Patient taking differently: Take 1,000 mg by mouth every 8 (eight) hours as needed for mild pain or headache. )  . albuterol (PROVENTIL HFA;VENTOLIN  HFA) 108 (90 Base) MCG/ACT inhaler Inhale 2 puffs into the lungs every 6 (six) hours as needed. (Patient taking differently: Inhale 2 puffs into the lungs every 6 (six) hours as needed for wheezing or shortness of breath. )  . apixaban (ELIQUIS) 2.5 MG TABS tablet Take 1 tablet (2.5 mg total) by mouth 2 (two) times daily.  . bisacodyl (DULCOLAX) 10 MG suppository Place 1 suppository (10 mg total) rectally daily as needed for moderate constipation.  . budesonide-formoterol (SYMBICORT) 80-4.5 MCG/ACT inhaler Inhale 2 puffs into the lungs 2 (two) times daily. (Patient taking differently: Inhale 2 puffs into the lungs 2 (two) times daily as needed (for "flares"). )  . Cholecalciferol  (VITAMIN D3) 1000 UNITS CAPS Take 1,000 Units by mouth daily.   . clorazepate (TRANXENE) 7.5 MG tablet Take 3.75 mg by mouth 2 (two) times daily as needed (for tightness in the stomach).   . diltiazem (CARDIZEM CD) 360 MG 24 hr capsule Take 1 capsule (360 mg total) by mouth daily.  . DULoxetine (CYMBALTA) 30 MG capsule Take 1 capsule (30 mg total) by mouth at bedtime.  . folic acid (FOLVITE) 846 MCG tablet Take 400 mcg by mouth daily.  . furosemide (LASIX) 20 MG tablet TAKE ONE TABLET BY MOUTH ONCE DAILY  . HYDROcodone-homatropine (HYCODAN) 5-1.5 MG/5ML syrup Take 5 mLs by mouth every 8 (eight) hours as needed for cough.  . hydrocortisone (ANUSOL-HC) 25 MG suppository Place 25 mg rectally as needed for hemorrhoids.   . hydroxypropyl methylcellulose (ISOPTO TEARS) 2.5 % ophthalmic solution Place 1 drop into both eyes 3 (three) times daily as needed for dry eyes.   Marland Kitchen levothyroxine (SYNTHROID, LEVOTHROID) 75 MCG tablet Take 75 mcg by mouth daily.    . magnesium hydroxide (MILK OF MAGNESIA) 400 MG/5ML suspension Take 5-15 mLs by mouth daily as needed for mild constipation.   . Melatonin 10 MG TABS Take 10 mg by mouth at bedtime as needed (for sleep).   . Multiple Vitamins-Minerals (ICAPS AREDS 2) CAPS Take 1 capsule by mouth 2 (two) times daily.   . nitroGLYCERIN (NITROSTAT) 0.4 MG SL tablet Place 0.4 mg under the tongue every 5 (five) minutes as needed for chest pain.   Marland Kitchen ondansetron (ZOFRAN) 4 MG tablet TAKE 1 TABLET BY MOUTH EVERY 8 HOURS AS NEEDED FOR NAUSEA OR VOMITING  . OXYGEN Inhale 4 L into the lungs See admin instructions. As instructed  . Papaya CHEW Chew 2-3 tablets by mouth as needed (for nausea).  Vladimir Faster Glycol-Propyl Glycol (SYSTANE) 0.4-0.3 % SOLN Place 1 drop into both eyes at bedtime as needed (for dry eyes).   . Probiotic CAPS Take 1 capsule by mouth daily.  Marland Kitchen senna-docusate (SENOKOT-S) 8.6-50 MG tablet Take 1 tablet by mouth 2 (two) times daily.  . sodium chloride HYPERTONIC  3 % nebulizer solution Take by nebulization 2 (two) times daily. (Patient taking differently: Take 4 mLs by nebulization 2 (two) times daily as needed (for Bronchiectasis-related symptoms). )  . trimethoprim-polymyxin b (POLYTRIM) ophthalmic solution Place 1 drop into the left eye See admin instructions. Instill 1 drop into the left eye three times a day for the 1st day and 1 drop four times a day on the 2nd day AFTER INJECTION  . vitamin B-12 (CYANOCOBALAMIN) 1000 MCG tablet Take 1,000 mcg by mouth daily.  . vitamin C (ASCORBIC ACID) 500 MG tablet Take 500 mg by mouth daily.   No facility-administered encounter medications on file as of 04/27/2018.  REVIEW OF SYSTEMS  : All other systems reviewed and negative except where noted in the History of Present Illness.   PHYSICAL EXAM: Ht 5\' 7"  (1.702 m)   Wt 108 lb (49 kg)   BMI 16.92 kg/m  General:  Thin and frail white female in no acute distress Head: Normocephalic and atraumatic Eyes:  Sclerae anicteric, conjunctiva pink. Ears: Normal auditory acuity Lungs: Clear throughout to auscultation; no increased WOB. Heart: Regular rate and rhythm; no M/R/G. Abdomen: Soft, non-distended.  BS present.  Non-tender. Musculoskeletal: Symmetrical with no gross deformities  Skin: No lesions on visible extremities Extremities: No edema  Neurological: Alert oriented x 4, grossly non-focal Psychological:  Alert and cooperative. Normal mood and affect  ASSESSMENT AND PLAN: *84 year old with ongoing complaints of nausea, occasional vomiting, poor appetite, weight loss, and constipation.  She is a poor candidate for procedures due to age, frailty, and oxygen use.  She had CT scan chest/abdomen/pelvis all this summer, which have shown no sign of malignancy.  She is not eating much at all, which is certainly why she is losing weight.  She is questioning about her gallbladder.  Symptoms do not sound that this is the issue and she would not be a candidate  for surgery; ultrasound showed slight wall thickening but no other findings.  I wonder how much better she would feel if she was actually moving her bowels better.  She needs to use something regularly.  Will restart her Senokot-S daily and use her MOM prn as well.    Will try to drink at least 2 Ensures daily.  Can take zofran 15-30 minutes before eating/drinking Ensure to try to settle her stomach.  Will check Hpylori serology.  They will call back in 2-3 weeks with an update on her symptoms.  **25 minutes spent with the patient in which at least 50% was spent in discussion of treatment options, etc.   CC:  [name], MD

## 2018-04-27 NOTE — Patient Instructions (Signed)
If you are age 82 or older, your body mass index should be between 23-30. Your Body mass index is 16.92 kg/m. If this is out of the aforementioned range listed, please consider follow up with your Primary Care Provider.  If you are age 6 or younger, your body mass index should be between 19-25. Your Body mass index is 16.92 kg/m. If this is out of the aformentioned range listed, please consider follow up with your Primary Care Provider.   Your provider has requested that you go to the basement level for lab work before leaving today. Press "B" on the elevator. The lab is located at the first door on the left as you exit the elevator. H Pylori serology  Take Senokot daily. Take Milk of Magnesia as needed.  Moving bowels approximately every other day.  Try to drink 2 Ensure daily.  Take Zofran 15-30 minutes before drinking Ensure.  Call back in 2-3 weeks with an update.  Ask for Chong Sicilian, RN.  Thank you for choosing me and [facility].   Melinda Lawrence, PA-C

## 2018-04-28 DIAGNOSIS — J479 Bronchiectasis, uncomplicated: Secondary | ICD-10-CM | POA: Diagnosis not present

## 2018-04-28 DIAGNOSIS — I5033 Acute on chronic diastolic (congestive) heart failure: Secondary | ICD-10-CM | POA: Diagnosis not present

## 2018-04-28 DIAGNOSIS — I4891 Unspecified atrial fibrillation: Secondary | ICD-10-CM | POA: Diagnosis not present

## 2018-04-28 DIAGNOSIS — I2510 Atherosclerotic heart disease of native coronary artery without angina pectoris: Secondary | ICD-10-CM | POA: Diagnosis not present

## 2018-04-28 DIAGNOSIS — J439 Emphysema, unspecified: Secondary | ICD-10-CM | POA: Diagnosis not present

## 2018-04-28 DIAGNOSIS — R634 Abnormal weight loss: Secondary | ICD-10-CM | POA: Diagnosis not present

## 2018-05-03 DIAGNOSIS — I5033 Acute on chronic diastolic (congestive) heart failure: Secondary | ICD-10-CM | POA: Diagnosis not present

## 2018-05-03 DIAGNOSIS — I2510 Atherosclerotic heart disease of native coronary artery without angina pectoris: Secondary | ICD-10-CM | POA: Diagnosis not present

## 2018-05-03 DIAGNOSIS — J439 Emphysema, unspecified: Secondary | ICD-10-CM | POA: Diagnosis not present

## 2018-05-03 DIAGNOSIS — I4891 Unspecified atrial fibrillation: Secondary | ICD-10-CM | POA: Diagnosis not present

## 2018-05-03 DIAGNOSIS — R634 Abnormal weight loss: Secondary | ICD-10-CM | POA: Diagnosis not present

## 2018-05-03 DIAGNOSIS — J479 Bronchiectasis, uncomplicated: Secondary | ICD-10-CM | POA: Diagnosis not present

## 2018-05-04 ENCOUNTER — Encounter: Payer: Self-pay | Admitting: Gastroenterology

## 2018-05-04 DIAGNOSIS — R630 Anorexia: Secondary | ICD-10-CM | POA: Insufficient documentation

## 2018-05-04 DIAGNOSIS — R634 Abnormal weight loss: Secondary | ICD-10-CM | POA: Diagnosis not present

## 2018-05-04 NOTE — Progress Notes (Signed)
Agree with assessment and plan as outlined.  

## 2018-05-05 DIAGNOSIS — R634 Abnormal weight loss: Secondary | ICD-10-CM | POA: Diagnosis not present

## 2018-05-05 DIAGNOSIS — I4891 Unspecified atrial fibrillation: Secondary | ICD-10-CM | POA: Diagnosis not present

## 2018-05-05 DIAGNOSIS — I2510 Atherosclerotic heart disease of native coronary artery without angina pectoris: Secondary | ICD-10-CM | POA: Diagnosis not present

## 2018-05-05 DIAGNOSIS — J479 Bronchiectasis, uncomplicated: Secondary | ICD-10-CM | POA: Diagnosis not present

## 2018-05-05 DIAGNOSIS — J439 Emphysema, unspecified: Secondary | ICD-10-CM | POA: Diagnosis not present

## 2018-05-05 DIAGNOSIS — I5033 Acute on chronic diastolic (congestive) heart failure: Secondary | ICD-10-CM | POA: Diagnosis not present

## 2018-05-10 DIAGNOSIS — I2510 Atherosclerotic heart disease of native coronary artery without angina pectoris: Secondary | ICD-10-CM | POA: Diagnosis not present

## 2018-05-10 DIAGNOSIS — J439 Emphysema, unspecified: Secondary | ICD-10-CM | POA: Diagnosis not present

## 2018-05-10 DIAGNOSIS — J479 Bronchiectasis, uncomplicated: Secondary | ICD-10-CM | POA: Diagnosis not present

## 2018-05-10 DIAGNOSIS — I5033 Acute on chronic diastolic (congestive) heart failure: Secondary | ICD-10-CM | POA: Diagnosis not present

## 2018-05-10 DIAGNOSIS — R634 Abnormal weight loss: Secondary | ICD-10-CM | POA: Diagnosis not present

## 2018-05-10 DIAGNOSIS — I4891 Unspecified atrial fibrillation: Secondary | ICD-10-CM | POA: Diagnosis not present

## 2018-05-11 DIAGNOSIS — I5033 Acute on chronic diastolic (congestive) heart failure: Secondary | ICD-10-CM | POA: Diagnosis not present

## 2018-05-11 DIAGNOSIS — J439 Emphysema, unspecified: Secondary | ICD-10-CM | POA: Diagnosis not present

## 2018-05-11 DIAGNOSIS — J479 Bronchiectasis, uncomplicated: Secondary | ICD-10-CM | POA: Diagnosis not present

## 2018-05-11 DIAGNOSIS — I4891 Unspecified atrial fibrillation: Secondary | ICD-10-CM | POA: Diagnosis not present

## 2018-05-11 DIAGNOSIS — I2510 Atherosclerotic heart disease of native coronary artery without angina pectoris: Secondary | ICD-10-CM | POA: Diagnosis not present

## 2018-05-11 DIAGNOSIS — R634 Abnormal weight loss: Secondary | ICD-10-CM | POA: Diagnosis not present

## 2018-05-12 DIAGNOSIS — J439 Emphysema, unspecified: Secondary | ICD-10-CM | POA: Diagnosis not present

## 2018-05-12 DIAGNOSIS — J479 Bronchiectasis, uncomplicated: Secondary | ICD-10-CM | POA: Diagnosis not present

## 2018-05-12 DIAGNOSIS — I5033 Acute on chronic diastolic (congestive) heart failure: Secondary | ICD-10-CM | POA: Diagnosis not present

## 2018-05-12 DIAGNOSIS — I2510 Atherosclerotic heart disease of native coronary artery without angina pectoris: Secondary | ICD-10-CM | POA: Diagnosis not present

## 2018-05-12 DIAGNOSIS — I4891 Unspecified atrial fibrillation: Secondary | ICD-10-CM | POA: Diagnosis not present

## 2018-05-12 DIAGNOSIS — R634 Abnormal weight loss: Secondary | ICD-10-CM | POA: Diagnosis not present

## 2018-05-17 DIAGNOSIS — I4891 Unspecified atrial fibrillation: Secondary | ICD-10-CM | POA: Diagnosis not present

## 2018-05-17 DIAGNOSIS — J439 Emphysema, unspecified: Secondary | ICD-10-CM | POA: Diagnosis not present

## 2018-05-17 DIAGNOSIS — J479 Bronchiectasis, uncomplicated: Secondary | ICD-10-CM | POA: Diagnosis not present

## 2018-05-17 DIAGNOSIS — R634 Abnormal weight loss: Secondary | ICD-10-CM | POA: Diagnosis not present

## 2018-05-17 DIAGNOSIS — I2510 Atherosclerotic heart disease of native coronary artery without angina pectoris: Secondary | ICD-10-CM | POA: Diagnosis not present

## 2018-05-17 DIAGNOSIS — I5033 Acute on chronic diastolic (congestive) heart failure: Secondary | ICD-10-CM | POA: Diagnosis not present

## 2018-05-18 DIAGNOSIS — J479 Bronchiectasis, uncomplicated: Secondary | ICD-10-CM | POA: Diagnosis not present

## 2018-05-18 DIAGNOSIS — I4891 Unspecified atrial fibrillation: Secondary | ICD-10-CM | POA: Diagnosis not present

## 2018-05-18 DIAGNOSIS — J439 Emphysema, unspecified: Secondary | ICD-10-CM | POA: Diagnosis not present

## 2018-05-18 DIAGNOSIS — R634 Abnormal weight loss: Secondary | ICD-10-CM | POA: Diagnosis not present

## 2018-05-18 DIAGNOSIS — I2510 Atherosclerotic heart disease of native coronary artery without angina pectoris: Secondary | ICD-10-CM | POA: Diagnosis not present

## 2018-05-18 DIAGNOSIS — I5033 Acute on chronic diastolic (congestive) heart failure: Secondary | ICD-10-CM | POA: Diagnosis not present

## 2018-05-19 DIAGNOSIS — I4891 Unspecified atrial fibrillation: Secondary | ICD-10-CM | POA: Diagnosis not present

## 2018-05-19 DIAGNOSIS — I5033 Acute on chronic diastolic (congestive) heart failure: Secondary | ICD-10-CM | POA: Diagnosis not present

## 2018-05-19 DIAGNOSIS — R634 Abnormal weight loss: Secondary | ICD-10-CM | POA: Diagnosis not present

## 2018-05-19 DIAGNOSIS — J439 Emphysema, unspecified: Secondary | ICD-10-CM | POA: Diagnosis not present

## 2018-05-19 DIAGNOSIS — I2510 Atherosclerotic heart disease of native coronary artery without angina pectoris: Secondary | ICD-10-CM | POA: Diagnosis not present

## 2018-05-19 DIAGNOSIS — J479 Bronchiectasis, uncomplicated: Secondary | ICD-10-CM | POA: Diagnosis not present

## 2018-05-23 ENCOUNTER — Encounter (INDEPENDENT_AMBULATORY_CARE_PROVIDER_SITE_OTHER): Payer: Medicare Other | Admitting: Ophthalmology

## 2018-05-23 DIAGNOSIS — H353114 Nonexudative age-related macular degeneration, right eye, advanced atrophic with subfoveal involvement: Secondary | ICD-10-CM | POA: Diagnosis not present

## 2018-05-23 DIAGNOSIS — H43813 Vitreous degeneration, bilateral: Secondary | ICD-10-CM | POA: Diagnosis not present

## 2018-05-23 DIAGNOSIS — H353221 Exudative age-related macular degeneration, left eye, with active choroidal neovascularization: Secondary | ICD-10-CM

## 2018-05-23 DIAGNOSIS — H35373 Puckering of macula, bilateral: Secondary | ICD-10-CM

## 2018-05-24 DIAGNOSIS — I2510 Atherosclerotic heart disease of native coronary artery without angina pectoris: Secondary | ICD-10-CM | POA: Diagnosis not present

## 2018-05-24 DIAGNOSIS — J479 Bronchiectasis, uncomplicated: Secondary | ICD-10-CM | POA: Diagnosis not present

## 2018-05-24 DIAGNOSIS — I4891 Unspecified atrial fibrillation: Secondary | ICD-10-CM | POA: Diagnosis not present

## 2018-05-24 DIAGNOSIS — R634 Abnormal weight loss: Secondary | ICD-10-CM | POA: Diagnosis not present

## 2018-05-24 DIAGNOSIS — J439 Emphysema, unspecified: Secondary | ICD-10-CM | POA: Diagnosis not present

## 2018-05-24 DIAGNOSIS — I5033 Acute on chronic diastolic (congestive) heart failure: Secondary | ICD-10-CM | POA: Diagnosis not present

## 2018-05-26 DIAGNOSIS — I5033 Acute on chronic diastolic (congestive) heart failure: Secondary | ICD-10-CM | POA: Diagnosis not present

## 2018-05-26 DIAGNOSIS — J479 Bronchiectasis, uncomplicated: Secondary | ICD-10-CM | POA: Diagnosis not present

## 2018-05-26 DIAGNOSIS — I4891 Unspecified atrial fibrillation: Secondary | ICD-10-CM | POA: Diagnosis not present

## 2018-05-26 DIAGNOSIS — J439 Emphysema, unspecified: Secondary | ICD-10-CM | POA: Diagnosis not present

## 2018-05-26 DIAGNOSIS — R634 Abnormal weight loss: Secondary | ICD-10-CM | POA: Diagnosis not present

## 2018-05-26 DIAGNOSIS — I2510 Atherosclerotic heart disease of native coronary artery without angina pectoris: Secondary | ICD-10-CM | POA: Diagnosis not present

## 2018-05-30 DIAGNOSIS — I2510 Atherosclerotic heart disease of native coronary artery without angina pectoris: Secondary | ICD-10-CM | POA: Diagnosis not present

## 2018-05-30 DIAGNOSIS — R634 Abnormal weight loss: Secondary | ICD-10-CM | POA: Diagnosis not present

## 2018-05-30 DIAGNOSIS — I5033 Acute on chronic diastolic (congestive) heart failure: Secondary | ICD-10-CM | POA: Diagnosis not present

## 2018-05-30 DIAGNOSIS — J439 Emphysema, unspecified: Secondary | ICD-10-CM | POA: Diagnosis not present

## 2018-05-30 DIAGNOSIS — J479 Bronchiectasis, uncomplicated: Secondary | ICD-10-CM | POA: Diagnosis not present

## 2018-05-30 DIAGNOSIS — I4891 Unspecified atrial fibrillation: Secondary | ICD-10-CM | POA: Diagnosis not present

## 2018-06-01 DIAGNOSIS — I4891 Unspecified atrial fibrillation: Secondary | ICD-10-CM | POA: Diagnosis not present

## 2018-06-01 DIAGNOSIS — I2510 Atherosclerotic heart disease of native coronary artery without angina pectoris: Secondary | ICD-10-CM | POA: Diagnosis not present

## 2018-06-01 DIAGNOSIS — I5033 Acute on chronic diastolic (congestive) heart failure: Secondary | ICD-10-CM | POA: Diagnosis not present

## 2018-06-01 DIAGNOSIS — J479 Bronchiectasis, uncomplicated: Secondary | ICD-10-CM | POA: Diagnosis not present

## 2018-06-01 DIAGNOSIS — J439 Emphysema, unspecified: Secondary | ICD-10-CM | POA: Diagnosis not present

## 2018-06-01 DIAGNOSIS — R634 Abnormal weight loss: Secondary | ICD-10-CM | POA: Diagnosis not present

## 2018-06-02 DIAGNOSIS — R634 Abnormal weight loss: Secondary | ICD-10-CM | POA: Diagnosis not present

## 2018-06-02 DIAGNOSIS — J439 Emphysema, unspecified: Secondary | ICD-10-CM | POA: Diagnosis not present

## 2018-06-02 DIAGNOSIS — J479 Bronchiectasis, uncomplicated: Secondary | ICD-10-CM | POA: Diagnosis not present

## 2018-06-02 DIAGNOSIS — I4891 Unspecified atrial fibrillation: Secondary | ICD-10-CM | POA: Diagnosis not present

## 2018-06-02 DIAGNOSIS — I2510 Atherosclerotic heart disease of native coronary artery without angina pectoris: Secondary | ICD-10-CM | POA: Diagnosis not present

## 2018-06-02 DIAGNOSIS — I5033 Acute on chronic diastolic (congestive) heart failure: Secondary | ICD-10-CM | POA: Diagnosis not present

## 2018-06-09 ENCOUNTER — Other Ambulatory Visit: Payer: Self-pay | Admitting: *Deleted

## 2018-06-09 MED ORDER — APIXABAN 2.5 MG PO TABS: 2.5000 mg | ORAL_TABLET | Freq: Two times a day (BID) | ORAL | 0 refills | Status: DC

## 2018-06-09 NOTE — Telephone Encounter (Signed)
Spoke with patient regarding refills for Eliquis.  Advised patient that she should follow up with cardiologist since her last ER visit in Aug with new onset Afib.  Rx sent to pharmacy as requested by patient and her husband as she only had 4 days of medication left.  Eliquis 2.5mg  BID was sent to pharmacy #[id number] to get her thru until appointment next week.   Patient given appointment to see Dr Agustin Cree on 06-15-18 at 1:20/1:40.  Patient agrees to plan and verbalized understanding.

## 2018-06-09 NOTE — Telephone Encounter (Signed)
*  STAT* If patient is at the pharmacy, call can be transferred to refill team.   1. Which medications need to be refilled? (please list name of each medication and dose if known) Eliquis 2.5 mg bid  2. Which pharmacy/location (including street and city if local pharmacy) is medication to be sent to?[facility] in [place]  3. Do they need a 30 day or 90 day supply? 30 day supply

## 2018-06-15 ENCOUNTER — Encounter: Payer: Self-pay | Admitting: Cardiology

## 2018-06-15 ENCOUNTER — Ambulatory Visit (INDEPENDENT_AMBULATORY_CARE_PROVIDER_SITE_OTHER): Payer: Medicare Other | Admitting: Cardiology

## 2018-06-15 VITALS — BP 124/66 | HR 64 | Ht 67.0 in | Wt 110.1 lb

## 2018-06-15 DIAGNOSIS — G609 Hereditary and idiopathic neuropathy, unspecified: Secondary | ICD-10-CM

## 2018-06-15 DIAGNOSIS — J479 Bronchiectasis, uncomplicated: Secondary | ICD-10-CM

## 2018-06-15 DIAGNOSIS — I5032 Chronic diastolic (congestive) heart failure: Secondary | ICD-10-CM

## 2018-06-15 DIAGNOSIS — I4891 Unspecified atrial fibrillation: Secondary | ICD-10-CM

## 2018-06-15 NOTE — Patient Instructions (Signed)
Medication Instructions:  Your physician recommends that you continue on your current medications as directed. Please refer to the Current Medication list given to you today.  If you need a refill on your cardiac medications before your next appointment, please call your pharmacy.   Lab work: Your physician recommends that you return for lab work today: BMP, TSH, BNP If you have labs (blood work) drawn today and your tests are completely normal, you will receive your results only by: Marland Kitchen MyChart Message (if you have MyChart) OR . A paper copy in the mail If you have any lab test that is abnormal or we need to change your treatment, we will call you to review the results.  Testing/Procedures: None.   Follow-Up: At [facility], you and your health needs are our priority.  As part of our continuing mission to provide you with exceptional heart care, we have created designated Provider Care Teams.  These Care Teams include your primary Cardiologist (physician) and Advanced Practice Providers (APPs -  Physician Assistants and Nurse Practitioners) who all work together to provide you with the care you need, when you need it. You will need a follow up appointment in 1 months.  Please call our office 2 months in advance to schedule this appointment.  You may see W Tollie Eth, MD or another member of our [place] Provider Team in [place]: Shirlee More, MD . Jyl Heinz, MD  Any Other Special Instructions Will Be Listed Below (If Applicable).

## 2018-06-15 NOTE — Progress Notes (Signed)
Cardiology Office Note:    Date:  06/15/2018   ID:  [name], DOB 1926-11-03, MRN [id number]  PCP:  [name], MD  Cardiologist:  Jenne Campus, MD    Referring MD: [name], MD   Chief Complaint  Patient presents with  . Follow-up  . Hospitalization Follow-up  I was in the hospital  History of Present Illness:    [name] is a 84 y.o. female who is very fragile with complex past medical history that include chronic diastolic congestive heart failure, atypical chest pain, hyperlipidemia, anxiety disorder, chronic obstructive pulmonary disease, fibromyalgia she comes to my office for follow-up overall she is doing fair she had difficulty moving around because of pain in his her hip as well as pain in her back complain of having swelling of lower extremities which bothers her a lot.  She did have some wound that likely finally healed up.  Denies having any palpitations.  Does have some chest pain which she points towards epigastrium that is not related to exercise.  Past Medical History:  Diagnosis Date  . Anxiety   . Arthritis   . CHF (congestive heart failure) ([place])   . Complication of anesthesia    " I have to have a spinal beacuse my lungs & heart are bad "  . COPD (chronic obstructive pulmonary disease) ([place])   . Coronary artery disease   . Depression   . Fibromyalgia   . Hiatal hernia   . Hyperlipidemia   . Irregular heartbeat   . On home oxygen therapy    "2L at night and prn" (03/29/2018)  . Osteoporosis   . Peripheral neuropathy 08/30/2017  . Rotator cuff tear    RIGHT  . UTI (urinary tract infection) 09/2014    Past Surgical History:  Procedure Laterality Date  . APPENDECTOMY    . CATARACT EXTRACTION Left   . DILATION AND CURETTAGE OF UTERUS     x2  . ELBOW SURGERY     Right  . HIP ARTHROPLASTY Right 09/22/2014   Procedure: ARTHROPLASTY BIPOLAR HIP;  Surgeon: [name], MD;  Location: [facility];  Service: Orthopedics;   Laterality: Right;  . TONSILLECTOMY    . TOTAL HIP REVISION Right 10/13/2014   Procedure: REVISION RIGHT TOTAL HIP POSTERIOR ;  Surgeon: Rod Can, MD;  Location: [facility];  Service: Orthopedics;  Laterality: Right;    Current Medications: Current Meds  Medication Sig  . acetaminophen (TYLENOL) 500 MG tablet Take 2 tablets (1,000 mg total) by mouth every 8 (eight) hours. (Patient taking differently: Take 1,000 mg by mouth every 8 (eight) hours as needed for mild pain or headache. )  . albuterol (PROVENTIL HFA;VENTOLIN HFA) 108 (90 Base) MCG/ACT inhaler Inhale 2 puffs into the lungs every 6 (six) hours as needed. (Patient taking differently: Inhale 2 puffs into the lungs every 6 (six) hours as needed for wheezing or shortness of breath. )  . apixaban (ELIQUIS) 2.5 MG TABS tablet Take 1 tablet (2.5 mg total) by mouth 2 (two) times daily.  . bisacodyl (DULCOLAX) 10 MG suppository Place 1 suppository (10 mg total) rectally daily as needed for moderate constipation.  . budesonide-formoterol (SYMBICORT) 80-4.5 MCG/ACT inhaler Inhale 2 puffs into the lungs 2 (two) times daily. (Patient taking differently: Inhale 2 puffs into the lungs 2 (two) times daily as needed (for "flares"). )  . Cholecalciferol (VITAMIN D3) 1000 UNITS CAPS Take 1,000 Units by mouth daily.   . clorazepate (TRANXENE) 7.5 MG  tablet Take 7.5 mg by mouth at bedtime.   Marland Kitchen diltiazem (CARDIZEM CD) 360 MG 24 hr capsule Take 1 capsule (360 mg total) by mouth daily.  . DULoxetine (CYMBALTA) 30 MG capsule Take 1 capsule (30 mg total) by mouth at bedtime.  . folic acid (FOLVITE) 601 MCG tablet Take 400 mcg by mouth daily.  . furosemide (LASIX) 20 MG tablet TAKE ONE TABLET BY MOUTH ONCE DAILY  . HYDROcodone-homatropine (HYCODAN) 5-1.5 MG/5ML syrup Take 5 mLs by mouth every 8 (eight) hours as needed for cough.  . hydrocortisone (ANUSOL-HC) 25 MG suppository Place 25 mg rectally as needed for hemorrhoids.   . hydroxypropyl methylcellulose  (ISOPTO TEARS) 2.5 % ophthalmic solution Place 1 drop into both eyes 3 (three) times daily as needed for dry eyes.   Marland Kitchen levothyroxine (SYNTHROID, LEVOTHROID) 75 MCG tablet Take 75 mcg by mouth daily.    . magnesium hydroxide (MILK OF MAGNESIA) 400 MG/5ML suspension Take 5-15 mLs by mouth daily as needed for mild constipation.   . Melatonin 10 MG TABS Take 10 mg by mouth at bedtime as needed (for sleep).   . Multiple Vitamins-Minerals (ICAPS AREDS 2) CAPS Take 1 capsule by mouth 2 (two) times daily.   . nitroGLYCERIN (NITROSTAT) 0.4 MG SL tablet Place 0.4 mg under the tongue every 5 (five) minutes as needed for chest pain.   Marland Kitchen ondansetron (ZOFRAN) 4 MG tablet TAKE 1 TABLET BY MOUTH EVERY 8 HOURS AS NEEDED FOR NAUSEA OR VOMITING  . OXYGEN Inhale 2 L into the lungs See admin instructions. As instructed  . Papaya CHEW Chew 2-3 tablets by mouth as needed (for nausea).  Vladimir Faster Glycol-Propyl Glycol (SYSTANE) 0.4-0.3 % SOLN Place 1 drop into both eyes at bedtime as needed (for dry eyes).   . sodium chloride HYPERTONIC 3 % nebulizer solution Take by nebulization 2 (two) times daily.  Marland Kitchen trimethoprim-polymyxin b (POLYTRIM) ophthalmic solution Place 1 drop into the left eye See admin instructions. Instill 1 drop into the left eye three times a day for the 1st day and 1 drop four times a day on the 2nd day AFTER INJECTION  . vitamin B-12 (CYANOCOBALAMIN) 1000 MCG tablet Take 1,000 mcg by mouth daily.  . vitamin C (ASCORBIC ACID) 500 MG tablet Take 500 mg by mouth daily.     Allergies:   Shrimp [shellfish allergy]; Fish allergy; Adhesive [tape]; and Other   Social History   Socioeconomic History  . Marital status: Married    Spouse name: widowed  . Number of children: 1  . Years of education: 29  . Highest education level: Not on file  Occupational History  . Occupation: retired.  prev worked [employer]  . Financial resource strain: Not on file  . Food insecurity:    Worry: Not on  file    Inability: Not on file  . Transportation needs:    Medical: Not on file    Non-medical: Not on file  Tobacco Use  . Smoking status: Never Smoker  . Smokeless tobacco: Never Used  Substance and Sexual Activity  . Alcohol use: No  . Drug use: No  . Sexual activity: Not on file  Lifestyle  . Physical activity:    Days per week: Not on file    Minutes per session: Not on file  . Stress: Not on file  Relationships  . Social connections:    Talks on phone: Not on file    Gets together: Not on file  Attends religious service: Not on file    Active member of club or organization: Not on file    Attends meetings of clubs or organizations: Not on file    Relationship status: Not on file  Other Topics Concern  . Not on file  Social History Narrative   Lives with husand   Caffeine use: 1.5 cups coffee per day and pepsi   Right handed      Family History: The patient's family history includes Diabetes in her son; Heart attack in her brother, sister, and son; Heart failure in her mother; Hypertension in her brother and mother; Obesity in her son; Pancreatic cancer in her father; Stroke in her brother. ROS:   Please see the history of present illness.    All 14 point review of systems negative except as described per history of present illness  EKGs/Labs/Other Studies Reviewed:      Recent Labs: 03/29/2018: B Natriuretic Peptide 604.9 03/30/2018: TSH 10.531 04/06/2018: ALT 15; BUN 17; Creatinine, Ser 0.85; Hemoglobin 12.5; Magnesium 2.4; Platelets 174; Potassium 3.8; Sodium 137  Recent Lipid Panel No results found for: CHOL, TRIG, HDL, CHOLHDL, VLDL, LDLCALC, LDLDIRECT  Physical Exam:    VS:  BP 124/66   Pulse 64   Ht 5\' 7"  (1.702 m)   Wt 110 lb 1.9 oz (50 kg)   SpO2 96%   BMI 17.25 kg/m     Wt Readings from Last 3 Encounters:  06/15/18 110 lb 1.9 oz (50 kg)  04/27/18 108 lb (49 kg)  04/19/18 112 lb (50.8 kg)     GEN:  Well nourished, well developed in no  acute distress HEENT: Normal NECK: No JVD; No carotid bruits LYMPHATICS: No lymphadenopathy CARDIAC: RRR, no murmurs, no rubs, no gallops RESPIRATORY:  Clear to auscultation without rales, wheezing or rhonchi  ABDOMEN: Soft, non-tender, non-distended MUSCULOSKELETAL:  No edema; No deformity  SKIN: Warm and dry LOWER EXTREMITIES: no swelling NEUROLOGIC:  Alert and oriented x 3 PSYCHIATRIC:  Normal affect   ASSESSMENT:    1. Chronic diastolic congestive heart failure ([place])   2. Bronchiectasis without acute exacerbation ([place])   3. Atrial fibrillation with RVR ([place])   4. Idiopathic peripheral neuropathy    PLAN:    In order of problems listed above:  1. Chronic diastolic congestive heart failure.  I will gently carefully try to increase the dose of diuretic she takes 20 mg furosemide will go to 20 mg alternating with 40 mg every other day.  I will check Chem-7 proBNP as well as TSH today.  Because of those changes to this very fragile lady I will see her back very quickly within a month. 2. Bronchiectasis follow-up by internal medicine team and pulmonary. 3. Paroxysmal atrial fibrillation.  She is anticoagulated with very small dose of Eliquis which I will continue her EKG today was poor in quality and I cannot determine for sure what the rhythm is. 4. Neuropathy followed by internal medicine team.   Medication Adjustments/Labs and Tests Ordered: Current medicines are reviewed at length with the patient today.  Concerns regarding medicines are outlined above.  No orders of the defined types were placed in this encounter.  Medication changes: No orders of the defined types were placed in this encounter.   Signed, Park Liter, MD, [facility] 06/15/2018 2:18 PM    [facility]

## 2018-06-15 NOTE — Addendum Note (Signed)
Addended by: Ashok Norris on: 06/15/2018 02:37 PM   Modules accepted: Orders

## 2018-06-15 NOTE — Addendum Note (Signed)
Addended by: Ashok Norris on: 06/15/2018 02:29 PM   Modules accepted: Orders

## 2018-06-16 LAB — BASIC METABOLIC PANEL
BUN/Creatinine Ratio: 24 (ref 12–28)
BUN: 21 mg/dL (ref 8–27)
CALCIUM: 9.4 mg/dL (ref 8.7–10.3)
CHLORIDE: 94 mmol/L — AB (ref 96–106)
CO2: 29 mmol/L (ref 20–29)
Creatinine, Ser: 0.88 mg/dL (ref 0.57–1.00)
GFR calc non Af Amer: 58 mL/min/{1.73_m2} — ABNORMAL LOW (ref >59)
GFR, EST AFRICAN AMERICAN: 67 mL/min/{1.73_m2} (ref >59)
Glucose: 88 mg/dL (ref 65–99)
POTASSIUM: 4.7 mmol/L (ref 3.5–5.2)
Sodium: 139 mmol/L (ref 134–144)

## 2018-06-16 LAB — TSH: TSH: 3.38 u[IU]/mL (ref 0.450–4.500)

## 2018-06-16 LAB — PRO B NATRIURETIC PEPTIDE: NT-PRO BNP: 2504 pg/mL — AB (ref 0–738)

## 2018-06-17 ENCOUNTER — Telehealth: Payer: Self-pay | Admitting: Cardiology

## 2018-06-17 ENCOUNTER — Other Ambulatory Visit: Payer: Self-pay | Admitting: Cardiology

## 2018-06-17 DIAGNOSIS — I4891 Unspecified atrial fibrillation: Secondary | ICD-10-CM

## 2018-06-17 DIAGNOSIS — Z79899 Other long term (current) drug therapy: Secondary | ICD-10-CM

## 2018-06-17 DIAGNOSIS — R55 Syncope and collapse: Secondary | ICD-10-CM

## 2018-06-17 DIAGNOSIS — I5032 Chronic diastolic (congestive) heart failure: Secondary | ICD-10-CM

## 2018-06-17 MED ORDER — FUROSEMIDE 20 MG PO TABS: 40.0000 mg | ORAL_TABLET | Freq: Every day | ORAL | 2 refills | Status: DC

## 2018-06-17 NOTE — Telephone Encounter (Signed)
Patient informed per verbal order from Dr. Agustin Cree to increase lasix to 40 mg daily and for patient to have labs redrawn next Tuesday 06/21/18. Patient verbally understands.

## 2018-06-17 NOTE — Telephone Encounter (Signed)
Patient reports needing eliquis refill and also reports continued lower extremity swelling. She states this has gotten better but ist and issue. No worsening shortness of breath at this time. She reports taking 40 mg of lasix yesterday and 40 mg of lasix today and normally takes 20 mg of lasix daily. Will inform Dr. Agustin Cree and advise patient with his recommendations.

## 2018-06-17 NOTE — Addendum Note (Signed)
Addended by: Ashok Norris on: 06/17/2018 02:51 PM   Modules accepted: Orders

## 2018-06-17 NOTE — Telephone Encounter (Signed)
°  Patient taking double lasix but still having swelling, almost out of meds   1. Which medications need to be refilled? (please list name of each medication and dose if known) Eloquis 2.5mg  twice a day  2. Which pharmacy/location (including street and city if local pharmacy) is medication to be sent to?[facility]  3. Do they need a 30 day or 90 day supply? [place]

## 2018-06-20 DIAGNOSIS — Z79899 Other long term (current) drug therapy: Secondary | ICD-10-CM | POA: Diagnosis not present

## 2018-06-20 DIAGNOSIS — B372 Candidiasis of skin and nail: Secondary | ICD-10-CM | POA: Diagnosis not present

## 2018-06-20 DIAGNOSIS — I509 Heart failure, unspecified: Secondary | ICD-10-CM | POA: Diagnosis not present

## 2018-06-20 DIAGNOSIS — K645 Perianal venous thrombosis: Secondary | ICD-10-CM | POA: Diagnosis not present

## 2018-06-21 LAB — BASIC METABOLIC PANEL
BUN / CREAT RATIO: 25 (ref 12–28)
BUN: 25 mg/dL (ref 8–27)
CO2: 28 mmol/L (ref 20–29)
Calcium: 9.2 mg/dL (ref 8.7–10.3)
Chloride: 94 mmol/L — ABNORMAL LOW (ref 96–106)
Creatinine, Ser: 0.99 mg/dL (ref 0.57–1.00)
GFR calc non Af Amer: 51 mL/min/{1.73_m2} — ABNORMAL LOW (ref >59)
GFR, EST AFRICAN AMERICAN: 58 mL/min/{1.73_m2} — AB (ref >59)
GLUCOSE: 68 mg/dL (ref 65–99)
Potassium: 4.5 mmol/L (ref 3.5–5.2)
Sodium: 139 mmol/L (ref 134–144)

## 2018-06-22 DIAGNOSIS — Z23 Encounter for immunization: Secondary | ICD-10-CM | POA: Diagnosis not present

## 2018-06-25 ENCOUNTER — Emergency Department (HOSPITAL_COMMUNITY)
Admission: EM | Admit: 2018-06-25 | Discharge: 2018-06-25 | Disposition: A | Discharge status: Home / Self Care | Payer: Medicare Other | Attending: Emergency Medicine | Admitting: Emergency Medicine

## 2018-06-25 ENCOUNTER — Other Ambulatory Visit: Payer: Self-pay

## 2018-06-25 ENCOUNTER — Emergency Department (HOSPITAL_COMMUNITY): Payer: Medicare Other

## 2018-06-25 ENCOUNTER — Encounter (HOSPITAL_COMMUNITY): Payer: Self-pay | Admitting: Emergency Medicine

## 2018-06-25 DIAGNOSIS — J449 Chronic obstructive pulmonary disease, unspecified: Secondary | ICD-10-CM | POA: Insufficient documentation

## 2018-06-25 DIAGNOSIS — S81812A Laceration without foreign body, left lower leg, initial encounter: Secondary | ICD-10-CM | POA: Insufficient documentation

## 2018-06-25 DIAGNOSIS — I5032 Chronic diastolic (congestive) heart failure: Secondary | ICD-10-CM | POA: Diagnosis not present

## 2018-06-25 DIAGNOSIS — S199XXA Unspecified injury of neck, initial encounter: Secondary | ICD-10-CM | POA: Diagnosis not present

## 2018-06-25 DIAGNOSIS — W01198A Fall on same level from slipping, tripping and stumbling with subsequent striking against other object, initial encounter: Secondary | ICD-10-CM | POA: Insufficient documentation

## 2018-06-25 DIAGNOSIS — E039 Hypothyroidism, unspecified: Secondary | ICD-10-CM | POA: Insufficient documentation

## 2018-06-25 DIAGNOSIS — Z7901 Long term (current) use of anticoagulants: Secondary | ICD-10-CM | POA: Insufficient documentation

## 2018-06-25 DIAGNOSIS — S0993XA Unspecified injury of face, initial encounter: Secondary | ICD-10-CM | POA: Diagnosis not present

## 2018-06-25 DIAGNOSIS — Y9389 Activity, other specified: Secondary | ICD-10-CM | POA: Diagnosis not present

## 2018-06-25 DIAGNOSIS — S01112A Laceration without foreign body of left eyelid and periocular area, initial encounter: Secondary | ICD-10-CM | POA: Insufficient documentation

## 2018-06-25 DIAGNOSIS — S0990XA Unspecified injury of head, initial encounter: Secondary | ICD-10-CM | POA: Diagnosis not present

## 2018-06-25 DIAGNOSIS — S0093XA Contusion of unspecified part of head, initial encounter: Secondary | ICD-10-CM | POA: Diagnosis not present

## 2018-06-25 DIAGNOSIS — Y999 Unspecified external cause status: Secondary | ICD-10-CM | POA: Insufficient documentation

## 2018-06-25 DIAGNOSIS — R220 Localized swelling, mass and lump, head: Secondary | ICD-10-CM | POA: Diagnosis not present

## 2018-06-25 DIAGNOSIS — M25572 Pain in left ankle and joints of left foot: Secondary | ICD-10-CM | POA: Diagnosis not present

## 2018-06-25 DIAGNOSIS — T148XXA Other injury of unspecified body region, initial encounter: Secondary | ICD-10-CM

## 2018-06-25 DIAGNOSIS — S79911A Unspecified injury of right hip, initial encounter: Secondary | ICD-10-CM | POA: Diagnosis not present

## 2018-06-25 DIAGNOSIS — S0083XA Contusion of other part of head, initial encounter: Secondary | ICD-10-CM

## 2018-06-25 DIAGNOSIS — M25562 Pain in left knee: Secondary | ICD-10-CM | POA: Diagnosis not present

## 2018-06-25 DIAGNOSIS — Y9289 Other specified places as the place of occurrence of the external cause: Secondary | ICD-10-CM | POA: Insufficient documentation

## 2018-06-25 DIAGNOSIS — Z23 Encounter for immunization: Secondary | ICD-10-CM | POA: Diagnosis not present

## 2018-06-25 DIAGNOSIS — S99912A Unspecified injury of left ankle, initial encounter: Secondary | ICD-10-CM | POA: Diagnosis not present

## 2018-06-25 DIAGNOSIS — R58 Hemorrhage, not elsewhere classified: Secondary | ICD-10-CM | POA: Diagnosis not present

## 2018-06-25 DIAGNOSIS — Z79899 Other long term (current) drug therapy: Secondary | ICD-10-CM | POA: Insufficient documentation

## 2018-06-25 DIAGNOSIS — S8012XA Contusion of left lower leg, initial encounter: Secondary | ICD-10-CM | POA: Diagnosis not present

## 2018-06-25 DIAGNOSIS — W19XXXA Unspecified fall, initial encounter: Secondary | ICD-10-CM

## 2018-06-25 LAB — CBG MONITORING, ED: Glucose-Capillary: 102 mg/dL — ABNORMAL HIGH (ref 70–99)

## 2018-06-25 MED ORDER — TETANUS-DIPHTH-ACELL PERTUSSIS 5-2.5-18.5 LF-MCG/0.5 IM SUSP
0.5000 mL | Freq: Once | INTRAMUSCULAR | Status: AC
  Administered 2018-06-25: 0.5 mL via INTRAMUSCULAR
  Filled 2018-06-25: qty 0.5

## 2018-06-25 MED ORDER — POVIDONE-IODINE 10 % EX SOLN
CUTANEOUS | Status: AC
  Filled 2018-06-25: qty 15

## 2018-06-25 NOTE — ED Provider Notes (Signed)
Pt with 2 skin tears on left anterior lower leg and a small laceration left forehead superior to eyebrow secondary to fall.  I was asked to repair these wounds.   LACERATION REPAIR Performed by: Evalee Jefferson Authorized by: Evalee Jefferson Consent: Verbal consent obtained. Risks and benefits: risks, benefits and alternatives were discussed Consent given by: patient Patient identity confirmed: provided demographic data Prepped and Draped in normal sterile fashion Wound explored  Laceration Location: left brow  Laceration Length: 0.5 cm   No Foreign Bodies seen or palpated  Anesthesia: none  Local anesthetic: none  Anesthetic total: none  Irrigation method: syringe Amount of cleaning: standard after Betadine to cleanse the wound.  Skin closure: Sterile strips including benzoin.  Number of sutures: 2  Technique: Sterile strips  Patient tolerance: Patient tolerated the procedure well with no immediate complications.  LACERATION REPAIR Performed by: Evalee Jefferson Authorized by: Evalee Jefferson Consent: Verbal consent obtained. Risks and benefits: risks, benefits and alternatives were discussed Consent given by: patient Patient identity confirmed: provided demographic data Prepped and Draped in normal sterile fashion Wound explored  Laceration Location: left lower leg, 2 skin tears, 1st was 2 cm proximal lower leg  Laceration Length: 2cm more proximal wound, 3 cm more distal wound, both are of anterior left lower leg.  No Foreign Bodies seen or palpated  Anesthesia: n/a Local anesthetic: n/a Anesthetic total: n/a  Irrigation method: syringe Amount of cleaning: standard after Betadine applied to wounds.  Skin closure: Sterile strips  Number of sutures: 3 and 4 respectively  Technique: Sterile strips  Patient tolerance: Patient tolerated the procedure well with no immediate complications.     Evalee Jefferson, PA-C 06/25/18 2021    Francine Graven, DO 06/30/18 1520

## 2018-06-25 NOTE — ED Triage Notes (Signed)
Pt reports she fell outside on cement, trying to avoid a rain puddle. Pt takes deliqu. Pt denies LOC, reports left ankle pain.Fall occurred about an hour ago. Pt alert and oriented.

## 2018-06-25 NOTE — ED Provider Notes (Signed)
[facility] EMERGENCY DEPARTMENT Provider Note   CSN: [id number] Arrival date & time: 06/25/18  1805     History   Chief Complaint Chief Complaint  Patient presents with  . Fall    HPI [name] is a 84 y.o. female.  HPI  Pt was seen at 1810. Per pt and her husband, c/o sudden onset and resolution of one episode of trip and fall that occurred PTA. Pt states she was trying to avoid stepping in a rain puddle when she fell onto the concrete. Pt states she fell to her left side, hitting her left head, face, and leg against the ground. Pt was able to stand with assistance and walk to a chair to sit down. Denies LOC, no AMS, no neck or back pain, no CP/SOB, no abd pain, no N/V/D, no focal motor weakness, no tingling/numbness in extremities.   Unk Td Past Medical History:  Diagnosis Date  . Anxiety   . Arthritis   . CHF (congestive heart failure) ([place])   . Complication of anesthesia    " I have to have a spinal beacuse my lungs & heart are bad "  . COPD (chronic obstructive pulmonary disease) ([place])   . Coronary artery disease   . Depression   . Fibromyalgia   . Hiatal hernia   . Hyperlipidemia   . Irregular heartbeat   . On home oxygen therapy    "2L at night and prn" (03/29/2018)  . Osteoporosis   . Peripheral neuropathy 08/30/2017  . Rotator cuff tear    RIGHT  . UTI (urinary tract infection) 09/2014    Patient Active Problem List   Diagnosis Date Noted  . Poor appetite 05/04/2018  . Atrial fibrillation with RVR ([place]) 03/30/2018  . Dysphagia 03/30/2018  . Diastolic CHF ([place]) 16/55/3748  . Abdominal distension (gaseous) 01/20/2018  . Loss of weight 01/20/2018  . Nausea and vomiting 01/20/2018  . Constipation 01/20/2018  . Peripheral neuropathy 08/30/2017  . Peri-prosthetic fracture of femur following total hip arthroplasty 10/13/2014  . Protein-calorie malnutrition, severe ([place]) 10/12/2014  . Periprosthetic fracture around internal prosthetic right hip joint  ([place]) 10/12/2014  . COPD exacerbation ([place])   . Acute cystitis without hematuria   . Hypothyroidism   . Hip fracture ([place]) 09/21/2014  . Fall at home 09/21/2014  . Syncope and collapse 07/02/2014  . Osteoporosis 04/10/2013  . Vaginal atrophy 04/10/2013  . Female pelvic pain 04/10/2013  . Hemoptysis, unspecified 10/12/2011  . Bronchiectasis without acute exacerbation ([place]) 05/19/2011    Past Surgical History:  Procedure Laterality Date  . APPENDECTOMY    . CATARACT EXTRACTION Left   . DILATION AND CURETTAGE OF UTERUS     x2  . ELBOW SURGERY     Right  . HIP ARTHROPLASTY Right 09/22/2014   Procedure: ARTHROPLASTY BIPOLAR HIP;  Surgeon: [name], MD;  Location: [facility];  Service: Orthopedics;  Laterality: Right;  . TONSILLECTOMY    . TOTAL HIP REVISION Right 10/13/2014   Procedure: REVISION RIGHT TOTAL HIP POSTERIOR ;  Surgeon: Rod Can, MD;  Location: [facility];  Service: Orthopedics;  Laterality: Right;     OB History    Gravida  1   Para  1   Term      Preterm      AB      Living  1     SAB      TAB      Ectopic  Multiple      Live Births               Home Medications    Prior to Admission medications   Medication Sig Start Date End Date Taking? Authorizing Provider  acetaminophen (TYLENOL) 500 MG tablet Take 2 tablets (1,000 mg total) by mouth every 8 (eight) hours. Patient taking differently: Take 1,000 mg by mouth every 8 (eight) hours as needed for mild pain or headache.  10/15/14   Swinteck, Aaron Edelman, MD  albuterol (PROVENTIL HFA;VENTOLIN HFA) 108 (90 Base) MCG/ACT inhaler Inhale 2 puffs into the lungs every 6 (six) hours as needed. Patient taking differently: Inhale 2 puffs into the lungs every 6 (six) hours as needed for wheezing or shortness of breath.  01/21/17   Juanito Doom, MD  bisacodyl (DULCOLAX) 10 MG suppository Place 1 suppository (10 mg total) rectally daily as needed for moderate constipation. 04/06/18   Bonnielee Haff, MD   budesonide-formoterol [facility]) 80-4.5 MCG/ACT inhaler Inhale 2 puffs into the lungs 2 (two) times daily. Patient taking differently: Inhale 2 puffs into the lungs 2 (two) times daily as needed (for "flares").  09/21/17   Tanda Rockers, MD  Cholecalciferol (VITAMIN D3) 1000 UNITS CAPS Take 1,000 Units by mouth daily.     [provider]  clorazepate (TRANXENE) 7.5 MG tablet Take 7.5 mg by mouth at bedtime.     [provider]  diltiazem (CARDIZEM CD) 360 MG 24 hr capsule Take 1 capsule (360 mg total) by mouth daily. 04/06/18   Bonnielee Haff, MD  DULoxetine (CYMBALTA) 30 MG capsule Take 1 capsule (30 mg total) by mouth at bedtime. 04/19/18   Kathrynn Ducking, MD  ELIQUIS 2.5 MG TABS tablet TAKE 1 TABLET BY MOUTH TWICE DAILY 06/20/18   Park Liter, MD  folic acid (FOLVITE) 748 MCG tablet Take 400 mcg by mouth daily.    [provider]  furosemide (LASIX) 20 MG tablet Take 2 tablets (40 mg total) by mouth daily. 06/17/18   Park Liter, MD  HYDROcodone-homatropine [facility]) 5-1.5 MG/5ML syrup Take 5 mLs by mouth every 8 (eight) hours as needed for cough. 02/14/16   Mannam, Hart Robinsons, MD  hydrocortisone (ANUSOL-HC) 25 MG suppository Place 25 mg rectally as needed for hemorrhoids.     [provider]  hydroxypropyl methylcellulose (ISOPTO TEARS) 2.5 % ophthalmic solution Place 1 drop into both eyes 3 (three) times daily as needed for dry eyes.     [provider]  levothyroxine (SYNTHROID, LEVOTHROID) 75 MCG tablet Take 75 mcg by mouth daily.      [provider]  magnesium hydroxide (MILK OF MAGNESIA) 400 MG/5ML suspension Take 5-15 mLs by mouth daily as needed for mild constipation.     [provider]  Melatonin 10 MG TABS Take 10 mg by mouth at bedtime as needed (for sleep).     [provider]  Multiple Vitamins-Minerals (ICAPS AREDS 2) CAPS Take 1 capsule by mouth 2 (two) times daily.     [provider]   nitroGLYCERIN (NITROSTAT) 0.4 MG SL tablet Place 0.4 mg under the tongue every 5 (five) minutes as needed for chest pain.     [provider]  ondansetron (ZOFRAN) 4 MG tablet TAKE 1 TABLET BY MOUTH EVERY 8 HOURS AS NEEDED FOR NAUSEA OR VOMITING 04/14/18   Zehr, Laban Emperor, PA-C  OXYGEN Inhale 2 L into the lungs See admin instructions. As instructed    [provider]  Papaya CHEW Chew 2-3 tablets by mouth as needed (for nausea).    [provider]  Polyethyl Glycol-Propyl Glycol (SYSTANE) 0.4-0.3 % SOLN Place 1 drop into both eyes at bedtime as needed (for dry eyes).     [provider]  sodium chloride HYPERTONIC 3 % nebulizer solution Take by nebulization 2 (two) times daily. 01/21/17   Juanito Doom, MD  trimethoprim-polymyxin b (POLYTRIM) ophthalmic solution Place 1 drop into the left eye See admin instructions. Instill 1 drop into the left eye three times a day for the 1st day and 1 drop four times a day on the 2nd day AFTER INJECTION 03/14/18   [provider]  vitamin B-12 (CYANOCOBALAMIN) 1000 MCG tablet Take 1,000 mcg by mouth daily.    [provider]  vitamin C (ASCORBIC ACID) 500 MG tablet Take 500 mg by mouth daily.    [provider]    Family History Family History  Problem Relation Age of Onset  . Pancreatic cancer Father   . Heart failure Mother   . Hypertension Mother   . Heart attack Brother   . Hypertension Brother   . Stroke Brother   . Heart attack Sister   . Diabetes Son   . Obesity Son   . Heart attack Son     Social History Social History   Tobacco Use  . Smoking status: Never Smoker  . Smokeless tobacco: Never Used  Substance Use Topics  . Alcohol use: No  . Drug use: No     Allergies   Shrimp [shellfish allergy]; Fish allergy; Adhesive [tape]; and Other   Review of Systems Review of Systems ROS: Statement: All systems negative except as marked or noted in the HPI; Constitutional:  Negative for fever and chills. ; ; Eyes: Negative for eye pain, redness and discharge. ; ; ENMT: Negative for ear pain, hoarseness, nasal congestion, sinus pressure and sore throat. ; ; Cardiovascular: Negative for chest pain, palpitations, diaphoresis, dyspnea and peripheral edema. ; ; Respiratory: Negative for cough, wheezing and stridor. ; ; Gastrointestinal: Negative for nausea, vomiting, diarrhea, abdominal pain, blood in stool, hematemesis, jaundice and rectal bleeding. . ; ; Genitourinary: Negative for dysuria, flank pain and hematuria. ; ; Musculoskeletal: Negative for back pain and neck pain. Negative for deformity. +head injury, left leg injury..; ; Skin: +skin tears. Negative for pruritus, rash, blisters, bruising and skin lesion.; ; Neuro: Negative for headache, lightheadedness and neck stiffness. Negative for weakness, altered level of consciousness, altered mental status, extremity weakness, paresthesias, involuntary movement, seizure and syncope.       Physical Exam Updated Vital Signs BP (!) 157/47   Pulse (!) 53   Temp 97.6 F (36.4 C) (Oral)   Resp 16   Ht 5\' 7"  (1.702 m)   Wt 49.9 kg   SpO2 94%   BMI 17.23 kg/m   Physical Exam 1815: Physical examination: Vital signs and O2 SAT: Reviewed; Constitutional: Well developed, Well nourished, Well hydrated, In no acute distress; Head and Face: Normocephalic, No scalp hematomas. +superficial horizontal lac just superior to left lateral eyebrow area.  Non-tender to palp superior and inferior orbital rim areas.  No zygoma tenderness.  No mandibular tenderness.; Eyes: EOMI, PERRL, No scleral icterus; ENMT: Mouth and pharynx normal, Left TM normal, Right TM normal, Mucous membranes moist, +teeth and tongue intact.  No intraoral or intranasal bleeding.  No septal hematomas.  No trismus, no malocclusion.; Neck: Supple, Trachea midline. No abrasions or ecchymosis.; Spine: No midline  CS, TS, LS tenderness.; Cardiovascular: Regular rate and  rhythm, No gallop; Respiratory: Breath sounds clear & equal bilaterally, No wheezes, Normal respiratory effort/excursion; Chest: Nontender, No deformity, Movement normal, No crepitus, No abrasions or ecchymosis.; Abdomen: Soft, Nontender, Nondistended, Normal bowel sounds, No abrasions or ecchymosis.; Genitourinary: No CVA tenderness;; Extremities: NT left hip/knee/foot. +horizontal skin tear just distal to left knee with localized hematoma.  +tender to palp left lateral maleolar area w/localized edema and ecchymosis. +horizontal skin tear with localized hematoma to anterior lower tibial/ankle area. NMS intact left foot, strong pedal pp, LE muscle compartments soft.  No left proximal fibular head tenderness, no knee tenderness, no foot tenderness.  No deformity, no ecchymosis, no erythema.  +plantarflexion of left foot w/calf squeeze.  No palpable gap left Achilles's tendon.  Full range of motion major/large joints of bilat UE's and LE's without pain or tenderness to palp, Neurovascularly intact, Pulses normal, No deformity. No tenderness, No edema, Pelvis stable; Neuro: AA&Ox3, GCS 15.  Major CN grossly intact. No facial droop. Speech clear. No gross focal motor or sensory deficits in extremities.; Skin: Color normal, Warm, Dry    ED Treatments / Results  Labs (all labs ordered are listed, but only abnormal results are displayed)   EKG None  Radiology   Procedures Procedures (including critical care time)  Medications Ordered in ED Medications  Tdap (BOOSTRIX) injection 0.5 mL (has no administration in time range)     Initial Impression / Assessment and Plan / ED Course  I have reviewed the triage vital signs and the nursing notes.  Pertinent labs & imaging results that were available during my care of the patient were reviewed by me and considered in my medical decision making (see chart for details).  MDM Reviewed: previous chart, nursing note and vitals Interpretation: x-ray and CT  scan   Results for orders placed or performed during the hospital encounter of 06/25/18  CBG monitoring, ED  Result Value Ref Range   Glucose-Capillary 102 (H) 70 - 99 mg/dL   Comment 1 Notify RN    Comment 2 Document in Chart    Dg Ankle Complete Left Result Date: 06/25/2018 CLINICAL DATA:  84 y/o  F; fall with left ankle pain. EXAM: DG HIP (WITH OR WITHOUT PELVIS) 2-3V LEFT; LEFT ANKLE COMPLETE - 3+ VIEW; LEFT FOOT - COMPLETE 3+ VIEW; LEFT KNEE - COMPLETE 4+ VIEW COMPARISON:  None. FINDINGS: Pelvis and left hip: Partially visualized right hemi plasty hardware with cerclage wires of the proximal femur. Hardware is intact. No periprosthetic lucency or fracture identified within the field of view. No pelvic fracture or diastasis. Mild osteoarthrosis of the left hip joint with acetabular fibrocystic degeneration and osteophytosis. Vascular calcifications noted. Left knee: No acute fracture, dislocation, or joint effusion. Mild loss of lateral femorotibial compartment joint space. Mild tricompartmental osteophytosis. Vascular calcifications noted. Left ankle: There is no evidence of hip fracture or dislocation. Ankle mortise is symmetric on these nonstress views. Talar dome is intact. Dorsal calcaneal enthesophytes. Vascular calcifications noted. Left foot: There is no evidence of hip fracture or dislocation. There is no evidence of arthropathy or other focal bone abnormality. IMPRESSION: 1. No acute fracture or dislocation identified. 2. Mild osteoarthrosis of the left hip and left knee. 3. Partially visualized right proximal femur hardware without apparent hardware related complication. Electronically Signed   By: Kristine Garbe M.D.   On: 06/25/2018 20:09   Ct Head Wo Contrast Result Date: 06/25/2018 CLINICAL DATA:  Fall outside onto cement, striking  the left side of the face. EXAM: CT HEAD WITHOUT CONTRAST CT MAXILLOFACIAL WITHOUT CONTRAST CT CERVICAL SPINE WITHOUT CONTRAST TECHNIQUE:  Multidetector CT imaging of the head, cervical spine, and maxillofacial structures were performed using the standard protocol without intravenous contrast. Multiplanar CT image reconstructions of the cervical spine and maxillofacial structures were also generated. COMPARISON:  CT scans from 07/02/2014 and 03/29/2018 FINDINGS: CT HEAD FINDINGS Brain: The brainstem, cerebellum, cerebral peduncles, thalami, basal ganglia, basilar cisterns, and ventricular system appear within normal limits. Periventricular white matter and corona radiata hypodensities favor chronic ischemic microvascular white matter disease. No intracranial hemorrhage, mass lesion, or acute CVA. Vascular: There is atherosclerotic calcification of the cavernous carotid arteries bilaterally. Skull: Unremarkable Other: Torus palatinus noted. CT MAXILLOFACIAL FINDINGS Osseous: Bilateral mandibular tori. Degenerative arthropathy of both temporomandibular joints, with diminutive size of the left mandibular condyle and flattening and spurring of the right mandibular condyle with loss of articular space. Several dental cavities. There are some irregularities of the mandible along the alveolar ridge on images 65 through 68 of series 9 anteriorly, but this appears roughly similar to the exam from 04/24/2008 hence I am skeptical of alveolar ridge fracture. No facial fracture is identified. Orbits: Unremarkable Sinuses: Small mucous retention. Cyst in the right maxillary sinus Soft tissues: Mild soft tissue swelling in the subcutaneous tissues anterior to the left maxilla. Bilateral common carotid atherosclerotic calcifications. CT CERVICAL SPINE FINDINGS Alignment: Mildly exaggerated cervical lordosis. Skull base and vertebrae: Loss of articular space and spurring at the anterior C1-2 articulation. No cervical spine fracture or acute bony findings. Soft tissues and spinal canal: Unremarkable Disc levels: Multilevel facet spurring without overt foraminal  impingement identified. Upper chest: Biapical pleuroparenchymal scarring with associated calcifications. Peripheral interstitial accentuation in the lungs stable scarring anteriorly in the left upper lobe. Other: No supplemental non-categorized findings. IMPRESSION: 1. No acute intracranial findings, acute cervical spine findings, or acute facial fracture. 2. Periventricular white matter and corona radiata hypodensities favor chronic ischemic microvascular white matter disease. 3. Degenerative arthropathy of both temporomandibular joints. 4. Mild soft tissue swelling in the subcutaneous tissues anterior to the left maxilla. 5. Atherosclerosis. 6. Biapical pleuroparenchymal scarring with associated calcifications. Electronically Signed   By: Van Clines M.D.   On: 06/25/2018 20:00   Ct Cervical Spine Wo Contrast Result Date: 06/25/2018 CLINICAL DATA:  Fall outside onto cement, striking the left side of the face. EXAM: CT HEAD WITHOUT CONTRAST CT MAXILLOFACIAL WITHOUT CONTRAST CT CERVICAL SPINE WITHOUT CONTRAST TECHNIQUE: Multidetector CT imaging of the head, cervical spine, and maxillofacial structures were performed using the standard protocol without intravenous contrast. Multiplanar CT image reconstructions of the cervical spine and maxillofacial structures were also generated. COMPARISON:  CT scans from 07/02/2014 and 03/29/2018 FINDINGS: CT HEAD FINDINGS Brain: The brainstem, cerebellum, cerebral peduncles, thalami, basal ganglia, basilar cisterns, and ventricular system appear within normal limits. Periventricular white matter and corona radiata hypodensities favor chronic ischemic microvascular white matter disease. No intracranial hemorrhage, mass lesion, or acute CVA. Vascular: There is atherosclerotic calcification of the cavernous carotid arteries bilaterally. Skull: Unremarkable Other: Torus palatinus noted. CT MAXILLOFACIAL FINDINGS Osseous: Bilateral mandibular tori. Degenerative arthropathy  of both temporomandibular joints, with diminutive size of the left mandibular condyle and flattening and spurring of the right mandibular condyle with loss of articular space. Several dental cavities. There are some irregularities of the mandible along the alveolar ridge on images 65 through 68 of series 9 anteriorly, but this appears roughly similar to the exam from 04/24/2008 hence I  am skeptical of alveolar ridge fracture. No facial fracture is identified. Orbits: Unremarkable Sinuses: Small mucous retention. Cyst in the right maxillary sinus Soft tissues: Mild soft tissue swelling in the subcutaneous tissues anterior to the left maxilla. Bilateral common carotid atherosclerotic calcifications. CT CERVICAL SPINE FINDINGS Alignment: Mildly exaggerated cervical lordosis. Skull base and vertebrae: Loss of articular space and spurring at the anterior C1-2 articulation. No cervical spine fracture or acute bony findings. Soft tissues and spinal canal: Unremarkable Disc levels: Multilevel facet spurring without overt foraminal impingement identified. Upper chest: Biapical pleuroparenchymal scarring with associated calcifications. Peripheral interstitial accentuation in the lungs stable scarring anteriorly in the left upper lobe. Other: No supplemental non-categorized findings. IMPRESSION: 1. No acute intracranial findings, acute cervical spine findings, or acute facial fracture. 2. Periventricular white matter and corona radiata hypodensities favor chronic ischemic microvascular white matter disease. 3. Degenerative arthropathy of both temporomandibular joints. 4. Mild soft tissue swelling in the subcutaneous tissues anterior to the left maxilla. 5. Atherosclerosis. 6. Biapical pleuroparenchymal scarring with associated calcifications. Electronically Signed   By: Van Clines M.D.   On: 06/25/2018 20:00   Dg Knee Complete 4 Views Left Result Date: 06/25/2018 CLINICAL DATA:  84 y/o  F; fall with left ankle  pain. EXAM: DG HIP (WITH OR WITHOUT PELVIS) 2-3V LEFT; LEFT ANKLE COMPLETE - 3+ VIEW; LEFT FOOT - COMPLETE 3+ VIEW; LEFT KNEE - COMPLETE 4+ VIEW COMPARISON:  None. FINDINGS: Pelvis and left hip: Partially visualized right hemi plasty hardware with cerclage wires of the proximal femur. Hardware is intact. No periprosthetic lucency or fracture identified within the field of view. No pelvic fracture or diastasis. Mild osteoarthrosis of the left hip joint with acetabular fibrocystic degeneration and osteophytosis. Vascular calcifications noted. Left knee: No acute fracture, dislocation, or joint effusion. Mild loss of lateral femorotibial compartment joint space. Mild tricompartmental osteophytosis. Vascular calcifications noted. Left ankle: There is no evidence of hip fracture or dislocation. Ankle mortise is symmetric on these nonstress views. Talar dome is intact. Dorsal calcaneal enthesophytes. Vascular calcifications noted. Left foot: There is no evidence of hip fracture or dislocation. There is no evidence of arthropathy or other focal bone abnormality. IMPRESSION: 1. No acute fracture or dislocation identified. 2. Mild osteoarthrosis of the left hip and left knee. 3. Partially visualized right proximal femur hardware without apparent hardware related complication. Electronically Signed   By: Kristine Garbe M.D.   On: 06/25/2018 20:09   Dg Foot Complete Left Result Date: 06/25/2018 CLINICAL DATA:  84 y/o  F; fall with left ankle pain. EXAM: DG HIP (WITH OR WITHOUT PELVIS) 2-3V LEFT; LEFT ANKLE COMPLETE - 3+ VIEW; LEFT FOOT - COMPLETE 3+ VIEW; LEFT KNEE - COMPLETE 4+ VIEW COMPARISON:  None. FINDINGS: Pelvis and left hip: Partially visualized right hemi plasty hardware with cerclage wires of the proximal femur. Hardware is intact. No periprosthetic lucency or fracture identified within the field of view. No pelvic fracture or diastasis. Mild osteoarthrosis of the left hip joint with acetabular  fibrocystic degeneration and osteophytosis. Vascular calcifications noted. Left knee: No acute fracture, dislocation, or joint effusion. Mild loss of lateral femorotibial compartment joint space. Mild tricompartmental osteophytosis. Vascular calcifications noted. Left ankle: There is no evidence of hip fracture or dislocation. Ankle mortise is symmetric on these nonstress views. Talar dome is intact. Dorsal calcaneal enthesophytes. Vascular calcifications noted. Left foot: There is no evidence of hip fracture or dislocation. There is no evidence of arthropathy or other focal bone abnormality. IMPRESSION: 1. No acute fracture  or dislocation identified. 2. Mild osteoarthrosis of the left hip and left knee. 3. Partially visualized right proximal femur hardware without apparent hardware related complication. Electronically Signed   By: Kristine Garbe M.D.   On: 06/25/2018 20:09   Dg Hip Unilat With Pelvis 2-3 Views Left Result Date: 06/25/2018 CLINICAL DATA:  84 y/o  F; fall with left ankle pain. EXAM: DG HIP (WITH OR WITHOUT PELVIS) 2-3V LEFT; LEFT ANKLE COMPLETE - 3+ VIEW; LEFT FOOT - COMPLETE 3+ VIEW; LEFT KNEE - COMPLETE 4+ VIEW COMPARISON:  None. FINDINGS: Pelvis and left hip: Partially visualized right hemi plasty hardware with cerclage wires of the proximal femur. Hardware is intact. No periprosthetic lucency or fracture identified within the field of view. No pelvic fracture or diastasis. Mild osteoarthrosis of the left hip joint with acetabular fibrocystic degeneration and osteophytosis. Vascular calcifications noted. Left knee: No acute fracture, dislocation, or joint effusion. Mild loss of lateral femorotibial compartment joint space. Mild tricompartmental osteophytosis. Vascular calcifications noted. Left ankle: There is no evidence of hip fracture or dislocation. Ankle mortise is symmetric on these nonstress views. Talar dome is intact. Dorsal calcaneal enthesophytes. Vascular calcifications  noted. Left foot: There is no evidence of hip fracture or dislocation. There is no evidence of arthropathy or other focal bone abnormality. IMPRESSION: 1. No acute fracture or dislocation identified. 2. Mild osteoarthrosis of the left hip and left knee. 3. Partially visualized right proximal femur hardware without apparent hardware related complication. Electronically Signed   By: Kristine Garbe M.D.   On: 06/25/2018 20:09   Ct Maxillofacial Wo Cm Result Date: 06/25/2018 CLINICAL DATA:  Fall outside onto cement, striking the left side of the face. EXAM: CT HEAD WITHOUT CONTRAST CT MAXILLOFACIAL WITHOUT CONTRAST CT CERVICAL SPINE WITHOUT CONTRAST TECHNIQUE: Multidetector CT imaging of the head, cervical spine, and maxillofacial structures were performed using the standard protocol without intravenous contrast. Multiplanar CT image reconstructions of the cervical spine and maxillofacial structures were also generated. COMPARISON:  CT scans from 07/02/2014 and 03/29/2018 FINDINGS: CT HEAD FINDINGS Brain: The brainstem, cerebellum, cerebral peduncles, thalami, basal ganglia, basilar cisterns, and ventricular system appear within normal limits. Periventricular white matter and corona radiata hypodensities favor chronic ischemic microvascular white matter disease. No intracranial hemorrhage, mass lesion, or acute CVA. Vascular: There is atherosclerotic calcification of the cavernous carotid arteries bilaterally. Skull: Unremarkable Other: Torus palatinus noted. CT MAXILLOFACIAL FINDINGS Osseous: Bilateral mandibular tori. Degenerative arthropathy of both temporomandibular joints, with diminutive size of the left mandibular condyle and flattening and spurring of the right mandibular condyle with loss of articular space. Several dental cavities. There are some irregularities of the mandible along the alveolar ridge on images 65 through 68 of series 9 anteriorly, but this appears roughly similar to the exam  from 04/24/2008 hence I am skeptical of alveolar ridge fracture. No facial fracture is identified. Orbits: Unremarkable Sinuses: Small mucous retention. Cyst in the right maxillary sinus Soft tissues: Mild soft tissue swelling in the subcutaneous tissues anterior to the left maxilla. Bilateral common carotid atherosclerotic calcifications. CT CERVICAL SPINE FINDINGS Alignment: Mildly exaggerated cervical lordosis. Skull base and vertebrae: Loss of articular space and spurring at the anterior C1-2 articulation. No cervical spine fracture or acute bony findings. Soft tissues and spinal canal: Unremarkable Disc levels: Multilevel facet spurring without overt foraminal impingement identified. Upper chest: Biapical pleuroparenchymal scarring with associated calcifications. Peripheral interstitial accentuation in the lungs stable scarring anteriorly in the left upper lobe. Other: No supplemental non-categorized findings. IMPRESSION: 1. No acute  intracranial findings, acute cervical spine findings, or acute facial fracture. 2. Periventricular white matter and corona radiata hypodensities favor chronic ischemic microvascular white matter disease. 3. Degenerative arthropathy of both temporomandibular joints. 4. Mild soft tissue swelling in the subcutaneous tissues anterior to the left maxilla. 5. Atherosclerosis. 6. Biapical pleuroparenchymal scarring with associated calcifications. Electronically Signed   By: Van Clines M.D.   On: 06/25/2018 20:00    2015:  Superficial skin tears re-approximated with steri-strips (see separate note). Td updated. Pt went to BR while in ED; stood and transferred steady, resps easy, NAD. XR/CT reassuring. Pt and spouse would like to go home now. Tx symptomatically. Dx and testing d/w pt and family.  Questions answered.  Verb understanding, agreeable to d/c home with outpt f/u.    Final Clinical Impressions(s) / ED Diagnoses   Final diagnoses:  None    ED Discharge Orders      None       Francine Graven, DO 06/30/18 1518

## 2018-06-25 NOTE — Discharge Instructions (Signed)
Take your usual prescriptions as previously directed.  Apply moist heat or ice to the area(s) of discomfort, for 15 minutes at a time, several times per day for the next few days.  Do not fall asleep on a heating or ice pack. Keep your wounds clean and dry, covered with a clean dressing. Follow the instructions regarding care of your steri-strips; they will fall off on their own.  Call your regular medical doctor on Monday to schedule a follow up appointment within the next 3 days.  Return to the Emergency Department immediately sooner if worsening.

## 2018-06-25 NOTE — ED Notes (Signed)
MD at bedside. 

## 2018-06-25 NOTE — ED Notes (Signed)
crackers/peanut butter, and sprite given to pt per Almyra Free, [place] ok

## 2018-06-25 NOTE — ED Notes (Signed)
Pt taken to bathroom via WC-nurse with pt while in bathroom for safety. Pt steady while standing and transferring from [facility] to toilet. Pt taken to xray afterwards.

## 2018-06-25 NOTE — ED Notes (Signed)
Almyra Free, PA at bedside for suture repair.

## 2018-06-28 ENCOUNTER — Encounter (HOSPITAL_COMMUNITY): Payer: Self-pay

## 2018-06-28 ENCOUNTER — Other Ambulatory Visit: Payer: Self-pay

## 2018-06-28 ENCOUNTER — Observation Stay (HOSPITAL_COMMUNITY): Payer: Medicare Other

## 2018-06-28 ENCOUNTER — Inpatient Hospital Stay (HOSPITAL_COMMUNITY)
Admission: EM | Admit: 2018-06-28 | Discharge: 2018-07-01 | DRG: 602 | Disposition: A | Discharge status: Home Health | Payer: Medicare Other | Attending: Student | Admitting: Student

## 2018-06-28 ENCOUNTER — Emergency Department (HOSPITAL_COMMUNITY): Payer: Medicare Other

## 2018-06-28 DIAGNOSIS — L03115 Cellulitis of right lower limb: Principal | ICD-10-CM | POA: Diagnosis present

## 2018-06-28 DIAGNOSIS — S0181XA Laceration without foreign body of other part of head, initial encounter: Secondary | ICD-10-CM | POA: Diagnosis not present

## 2018-06-28 DIAGNOSIS — Z91040 Latex allergy status: Secondary | ICD-10-CM

## 2018-06-28 DIAGNOSIS — W19XXXA Unspecified fall, initial encounter: Secondary | ICD-10-CM | POA: Diagnosis not present

## 2018-06-28 DIAGNOSIS — I480 Paroxysmal atrial fibrillation: Secondary | ICD-10-CM | POA: Diagnosis not present

## 2018-06-28 DIAGNOSIS — Z881 Allergy status to other antibiotic agents status: Secondary | ICD-10-CM

## 2018-06-28 DIAGNOSIS — I5032 Chronic diastolic (congestive) heart failure: Secondary | ICD-10-CM

## 2018-06-28 DIAGNOSIS — I10 Essential (primary) hypertension: Secondary | ICD-10-CM | POA: Diagnosis not present

## 2018-06-28 DIAGNOSIS — M797 Fibromyalgia: Secondary | ICD-10-CM | POA: Diagnosis present

## 2018-06-28 DIAGNOSIS — I872 Venous insufficiency (chronic) (peripheral): Secondary | ICD-10-CM | POA: Diagnosis present

## 2018-06-28 DIAGNOSIS — Z96641 Presence of right artificial hip joint: Secondary | ICD-10-CM | POA: Diagnosis present

## 2018-06-28 DIAGNOSIS — K5909 Other constipation: Secondary | ICD-10-CM | POA: Diagnosis present

## 2018-06-28 DIAGNOSIS — J479 Bronchiectasis, uncomplicated: Secondary | ICD-10-CM | POA: Diagnosis not present

## 2018-06-28 DIAGNOSIS — R0902 Hypoxemia: Secondary | ICD-10-CM | POA: Diagnosis not present

## 2018-06-28 DIAGNOSIS — L03119 Cellulitis of unspecified part of limb: Secondary | ICD-10-CM | POA: Diagnosis present

## 2018-06-28 DIAGNOSIS — K922 Gastrointestinal hemorrhage, unspecified: Secondary | ICD-10-CM | POA: Diagnosis not present

## 2018-06-28 DIAGNOSIS — W010XXA Fall on same level from slipping, tripping and stumbling without subsequent striking against object, initial encounter: Secondary | ICD-10-CM | POA: Diagnosis present

## 2018-06-28 DIAGNOSIS — E43 Unspecified severe protein-calorie malnutrition: Secondary | ICD-10-CM | POA: Diagnosis not present

## 2018-06-28 DIAGNOSIS — Z91013 Allergy to seafood: Secondary | ICD-10-CM

## 2018-06-28 DIAGNOSIS — R600 Localized edema: Secondary | ICD-10-CM | POA: Diagnosis not present

## 2018-06-28 DIAGNOSIS — F419 Anxiety disorder, unspecified: Secondary | ICD-10-CM | POA: Diagnosis present

## 2018-06-28 DIAGNOSIS — Z91018 Allergy to other foods: Secondary | ICD-10-CM

## 2018-06-28 DIAGNOSIS — F329 Major depressive disorder, single episode, unspecified: Secondary | ICD-10-CM | POA: Diagnosis present

## 2018-06-28 DIAGNOSIS — R609 Edema, unspecified: Secondary | ICD-10-CM | POA: Diagnosis not present

## 2018-06-28 DIAGNOSIS — D510 Vitamin B12 deficiency anemia due to intrinsic factor deficiency: Secondary | ICD-10-CM | POA: Diagnosis present

## 2018-06-28 DIAGNOSIS — S0003XA Contusion of scalp, initial encounter: Secondary | ICD-10-CM | POA: Diagnosis not present

## 2018-06-28 DIAGNOSIS — Z681 Body mass index (BMI) 19 or less, adult: Secondary | ICD-10-CM | POA: Diagnosis not present

## 2018-06-28 DIAGNOSIS — R531 Weakness: Secondary | ICD-10-CM | POA: Diagnosis not present

## 2018-06-28 DIAGNOSIS — E039 Hypothyroidism, unspecified: Secondary | ICD-10-CM | POA: Diagnosis present

## 2018-06-28 DIAGNOSIS — S01112A Laceration without foreign body of left eyelid and periocular area, initial encounter: Secondary | ICD-10-CM | POA: Diagnosis not present

## 2018-06-28 DIAGNOSIS — Z7901 Long term (current) use of anticoagulants: Secondary | ICD-10-CM

## 2018-06-28 DIAGNOSIS — R627 Adult failure to thrive: Secondary | ICD-10-CM | POA: Diagnosis present

## 2018-06-28 DIAGNOSIS — Z7951 Long term (current) use of inhaled steroids: Secondary | ICD-10-CM

## 2018-06-28 DIAGNOSIS — J9610 Chronic respiratory failure, unspecified whether with hypoxia or hypercapnia: Secondary | ICD-10-CM | POA: Diagnosis not present

## 2018-06-28 DIAGNOSIS — Z9981 Dependence on supplemental oxygen: Secondary | ICD-10-CM

## 2018-06-28 DIAGNOSIS — R296 Repeated falls: Secondary | ICD-10-CM | POA: Diagnosis present

## 2018-06-28 DIAGNOSIS — E785 Hyperlipidemia, unspecified: Secondary | ICD-10-CM | POA: Diagnosis present

## 2018-06-28 DIAGNOSIS — Z79899 Other long term (current) drug therapy: Secondary | ICD-10-CM

## 2018-06-28 DIAGNOSIS — H3530 Unspecified macular degeneration: Secondary | ICD-10-CM | POA: Diagnosis present

## 2018-06-28 DIAGNOSIS — I2510 Atherosclerotic heart disease of native coronary artery without angina pectoris: Secondary | ICD-10-CM | POA: Diagnosis present

## 2018-06-28 DIAGNOSIS — E876 Hypokalemia: Secondary | ICD-10-CM | POA: Diagnosis not present

## 2018-06-28 DIAGNOSIS — K649 Unspecified hemorrhoids: Secondary | ICD-10-CM | POA: Diagnosis present

## 2018-06-28 DIAGNOSIS — M810 Age-related osteoporosis without current pathological fracture: Secondary | ICD-10-CM | POA: Diagnosis present

## 2018-06-28 DIAGNOSIS — I739 Peripheral vascular disease, unspecified: Secondary | ICD-10-CM | POA: Diagnosis present

## 2018-06-28 LAB — URINALYSIS, ROUTINE W REFLEX MICROSCOPIC
Bilirubin Urine: NEGATIVE
GLUCOSE, UA: NEGATIVE mg/dL
HGB URINE DIPSTICK: NEGATIVE
Ketones, ur: NEGATIVE mg/dL
Leukocytes, UA: NEGATIVE
Nitrite: NEGATIVE
PH: 8 (ref 5.0–8.0)
PROTEIN: NEGATIVE mg/dL
SPECIFIC GRAVITY, URINE: 1.005 (ref 1.005–1.030)

## 2018-06-28 LAB — BASIC METABOLIC PANEL
ANION GAP: 9 (ref 5–15)
BUN: 19 mg/dL (ref 8–23)
CHLORIDE: 96 mmol/L — AB (ref 98–111)
CO2: 35 mmol/L — AB (ref 22–32)
CREATININE: 0.98 mg/dL (ref 0.44–1.00)
Calcium: 9.2 mg/dL (ref 8.9–10.3)
GFR calc Af Amer: 59 mL/min — ABNORMAL LOW (ref >60)
GFR, EST NON AFRICAN AMERICAN: 51 mL/min — AB (ref >60)
Glucose, Bld: 93 mg/dL (ref 70–99)
Potassium: 3.5 mmol/L (ref 3.5–5.1)
SODIUM: 140 mmol/L (ref 135–145)

## 2018-06-28 LAB — CBC WITH DIFFERENTIAL/PLATELET
Abs Immature Granulocytes: 0.02 10*3/uL (ref 0.00–0.07)
BASOS ABS: 0 10*3/uL (ref 0.0–0.1)
Basophils Relative: 0 %
EOS PCT: 2 %
Eosinophils Absolute: 0.1 10*3/uL (ref 0.0–0.5)
HEMATOCRIT: 38.2 % (ref 36.0–46.0)
HEMOGLOBIN: 11.6 g/dL — AB (ref 12.0–15.0)
Immature Granulocytes: 0 %
LYMPHS ABS: 0.9 10*3/uL (ref 0.7–4.0)
LYMPHS PCT: 13 %
MCH: 30.7 pg (ref 26.0–34.0)
MCHC: 30.4 g/dL (ref 30.0–36.0)
MCV: 101.1 fL — ABNORMAL HIGH (ref 80.0–100.0)
Monocytes Absolute: 0.6 10*3/uL (ref 0.1–1.0)
Monocytes Relative: 8 %
NEUTROS PCT: 77 %
NRBC: 0 % (ref 0.0–0.2)
Neutro Abs: 5.3 10*3/uL (ref 1.7–7.7)
Platelets: 179 10*3/uL (ref 150–400)
RBC: 3.78 MIL/uL — AB (ref 3.87–5.11)
RDW: 14.6 % (ref 11.5–15.5)
WBC: 7 10*3/uL (ref 4.0–10.5)

## 2018-06-28 MED ORDER — VITAMIN B-12 1000 MCG PO TABS
1000.0000 ug | ORAL_TABLET | Freq: Every day | ORAL | Status: DC
  Administered 2018-06-28 – 2018-07-01 (×4): 1000 ug via ORAL
  Filled 2018-06-28 (×4): qty 1

## 2018-06-28 MED ORDER — SODIUM CHLORIDE 0.9 % IV SOLN
INTRAVENOUS | Status: DC | PRN
  Administered 2018-06-28: 250 mL via INTRAVENOUS

## 2018-06-28 MED ORDER — DILTIAZEM HCL ER COATED BEADS 360 MG PO CP24
360.0000 mg | ORAL_CAPSULE | Freq: Every day | ORAL | Status: DC
  Administered 2018-06-28 – 2018-07-01 (×4): 360 mg via ORAL
  Filled 2018-06-28: qty 1
  Filled 2018-06-28: qty 2
  Filled 2018-06-28 (×4): qty 1
  Filled 2018-06-28 (×2): qty 2
  Filled 2018-06-28 (×2): qty 1
  Filled 2018-06-28: qty 2

## 2018-06-28 MED ORDER — FUROSEMIDE 40 MG PO TABS
40.0000 mg | ORAL_TABLET | Freq: Every day | ORAL | Status: DC
  Administered 2018-06-28 – 2018-07-01 (×4): 40 mg via ORAL
  Filled 2018-06-28 (×4): qty 1

## 2018-06-28 MED ORDER — CLORAZEPATE DIPOTASSIUM 7.5 MG PO TABS
7.5000 mg | ORAL_TABLET | Freq: Every day | ORAL | Status: DC
  Administered 2018-06-28 – 2018-06-30 (×3): 7.5 mg via ORAL
  Filled 2018-06-28 (×3): qty 1

## 2018-06-28 MED ORDER — VITAMIN D 25 MCG (1000 UNIT) PO TABS
1000.0000 [IU] | ORAL_TABLET | Freq: Every day | ORAL | Status: DC
  Administered 2018-06-28 – 2018-07-01 (×4): 1000 [IU] via ORAL
  Filled 2018-06-28 (×4): qty 1

## 2018-06-28 MED ORDER — ONDANSETRON HCL 4 MG/2ML IJ SOLN: 4.0000 mg | Freq: Four times a day (QID) | INTRAMUSCULAR | Status: DC | PRN

## 2018-06-28 MED ORDER — VANCOMYCIN HCL IN DEXTROSE 1-5 GM/200ML-% IV SOLN
1000.0000 mg | Freq: Once | INTRAVENOUS | Status: AC
  Administered 2018-06-28: 1000 mg via INTRAVENOUS
  Filled 2018-06-28: qty 200

## 2018-06-28 MED ORDER — FOLIC ACID 1 MG PO TABS
1000.0000 ug | ORAL_TABLET | Freq: Every day | ORAL | Status: DC
  Administered 2018-06-28 – 2018-07-01 (×4): 1 mg via ORAL
  Filled 2018-06-28 (×4): qty 1

## 2018-06-28 MED ORDER — GABAPENTIN 300 MG PO CAPS
300.0000 mg | ORAL_CAPSULE | Freq: Once | ORAL | Status: AC
  Administered 2018-06-28: 300 mg via ORAL
  Filled 2018-06-28: qty 1

## 2018-06-28 MED ORDER — ONDANSETRON HCL 4 MG PO TABS: 4.0000 mg | ORAL_TABLET | Freq: Four times a day (QID) | ORAL | Status: DC | PRN

## 2018-06-28 MED ORDER — LEVOTHYROXINE SODIUM 75 MCG PO TABS
75.0000 ug | ORAL_TABLET | Freq: Every day | ORAL | Status: DC
  Administered 2018-06-28 – 2018-07-01 (×4): 75 ug via ORAL
  Filled 2018-06-28 (×4): qty 1

## 2018-06-28 MED ORDER — OCUVITE-LUTEIN PO CAPS
ORAL_CAPSULE | Freq: Two times a day (BID) | ORAL | Status: DC
  Administered 2018-06-28 – 2018-07-01 (×7): 1 via ORAL
  Filled 2018-06-28 (×7): qty 1

## 2018-06-28 MED ORDER — CEFAZOLIN SODIUM-DEXTROSE 1-4 GM/50ML-% IV SOLN: 1.0000 g | Freq: Three times a day (TID) | INTRAVENOUS | Status: DC

## 2018-06-28 MED ORDER — SODIUM CHLORIDE 0.9 % IV BOLUS
500.0000 mL | Freq: Once | INTRAVENOUS | Status: AC
  Administered 2018-06-28: 500 mL via INTRAVENOUS

## 2018-06-28 MED ORDER — POLYVINYL ALCOHOL 1.4 % OP SOLN: 1.0000 [drp] | Freq: Every evening | OPHTHALMIC | Status: DC | PRN

## 2018-06-28 MED ORDER — ACETAMINOPHEN 325 MG PO TABS
650.0000 mg | ORAL_TABLET | Freq: Four times a day (QID) | ORAL | Status: DC | PRN
  Administered 2018-06-28 – 2018-06-30 (×3): 650 mg via ORAL
  Filled 2018-06-28 (×3): qty 2

## 2018-06-28 MED ORDER — CEFAZOLIN SODIUM-DEXTROSE 1-4 GM/50ML-% IV SOLN
1.0000 g | Freq: Two times a day (BID) | INTRAVENOUS | Status: DC
  Administered 2018-06-28 – 2018-06-30 (×5): 1 g via INTRAVENOUS
  Filled 2018-06-28 (×7): qty 50

## 2018-06-28 MED ORDER — DULOXETINE HCL 30 MG PO CPEP
30.0000 mg | ORAL_CAPSULE | Freq: Every day | ORAL | Status: DC
  Filled 2018-06-28 (×2): qty 1

## 2018-06-28 MED ORDER — LIDOCAINE-EPINEPHRINE (PF) 2 %-1:200000 IJ SOLN
10.0000 mL | Freq: Once | INTRAMUSCULAR | Status: AC
  Administered 2018-06-28: 10 mL
  Filled 2018-06-28: qty 10

## 2018-06-28 MED ORDER — MOMETASONE FURO-FORMOTEROL FUM 100-5 MCG/ACT IN AERO
2.0000 | INHALATION_SPRAY | Freq: Two times a day (BID) | RESPIRATORY_TRACT | Status: DC
  Administered 2018-06-28 – 2018-07-01 (×6): 2 via RESPIRATORY_TRACT
  Filled 2018-06-28: qty 8.8

## 2018-06-28 MED ORDER — IPRATROPIUM-ALBUTEROL 0.5-2.5 (3) MG/3ML IN SOLN
3.0000 mL | Freq: Three times a day (TID) | RESPIRATORY_TRACT | Status: DC
  Administered 2018-06-28 – 2018-06-30 (×7): 3 mL via RESPIRATORY_TRACT
  Filled 2018-06-28 (×7): qty 3

## 2018-06-28 MED ORDER — ACETAMINOPHEN 650 MG RE SUPP: 650.0000 mg | Freq: Four times a day (QID) | RECTAL | Status: DC | PRN

## 2018-06-28 NOTE — ED Provider Notes (Signed)
LACERATION REPAIR LEFT EYEBROW  Patient is an 84 year old female who presents to the emergency department following a fall with laceration to the eyebrow.  The patient sustained a laceration to the eyebrow during the fall.  It was a mechanical fall, and no reported loss of consciousness.  It is of note however that the patient is on Eliquis.  I discussed the laceration repair with the patient and her husband in terms in which they understand.  Questions were answered.  Patient gave permission for the procedure.  Patient identified by armband.  Procedural timeout taken.  The area was cleansed with sure-cleanse.  The laceration area was then infiltrated with 1% lidocaine with epi.  After anesthetic, the wound was cleansed thoroughly.  The wound was evaluated, and no bone involvement.  No debris appreciated.  The patient does have active bleeding.  The wound was repaired with 8 interrupted sutures of 6-0 Vicryl-Rapide.  The wound measures 2.4 cm. Bleeding improved significantly.  The area was again cleansed, and a dressing was requested.  Patient tolerated the procedure without problem.   Lily Kocher, PA-C 06/28/18 1100    Nat Christen, MD 06/29/18 [phone]

## 2018-06-28 NOTE — Progress Notes (Signed)
PT refused treatment at this time states she hasn't ate anything and she is still anxious from her fall.  PT will be seen tomorrow.       Rayetta Humphrey, [place] CLT [phone]

## 2018-06-28 NOTE — ED Triage Notes (Signed)
Pt reports her legs "gave out" when she got up to use the bathroom this morning.  Pt says she fell face first and hit head on floor.  Pt fell Sat too.  Pt says she is on a fluid pill and says its draining her.  Pt has redness, swelling, and dark discoloration to both legs. r worse than left.  Pt has dressing over left eye and dressing to left arm.

## 2018-06-28 NOTE — H&P (Signed)
History and Physical  [name] ZOX:[id number] DOB: 1927-04-02 DOA: 06/28/2018   PCP: [name], MD   Patient coming from: Home  Chief Complaint: fall  HPI:  [name] is a 84 y.o. female with medical history of paroxysmal atrial fibrillation, dCHF, COPD, coronary disease, pernicious anemia, chronic respiratory failure on 2 L at nighttime, anxiety presenting after mechanical fall on the morning of 06/28/2018.  Patient was getting up off the bedside commode when she tripped and fell onto her face.  She did not lose consciousness.  The patient was in the emergency department on 06/25/2018 after sustaining a mechanical fall at that time, falling onto her face.  Repaired at that time as well as a laceration above her left eyebrow.  Steri-Strips with her skin was too thin for sutures.  The patient did not lose consciousness during the fall.  The patient denied any other falls in the past month.  The patient denied any fevers, chills, chest pain, shortness breath, aura, nausea, vomiting, diarrhea, abdominal pain.  She complains of some generalized weakness since starting on furosemide when she was discharged from the hospital on 04/06/2018. Nevertheless, the patient has noted increasing edema of her right lower extremity greater than left lower extremity for about the last week even prior to the falls.  In addition, she has noted some erythema, but did not think much of it.  Finally, when she is asked, she has noted some increasing pain in her right lower extremity for the past week.  She denies any fevers, chills.  In the emergency department, the patient was afebrile hemodynamically stable saturating 93% on room air.  Heart rate was in the low 100s.  BMP was unremarkable.  CBC showed WBC 7.0 with hemoglobin 11.6.  Because of erythema right lower extremity there was concern for cellulitis.  The patient was given a dose of vancomycin.  Assessment/Plan: Right lower extremity  cellulitis -Venous duplex right lower extremity -Start cefazolin -Please see picture below  Forehead and left facial hematoma -Secondary to mechanical fall -Suture repaired in the emergency department -Hold apixaban at least for 24 hours -PT eval for frequent falls  Pernicious anemia -Continue B12 -Hemoglobin has dropped 1 g since 04/06/2018--suspect this may be related to her hematoma -Check FOBT -She is hemodynamically stable  Paroxysmal atrial fibrillation -Presently in sinus rhythm -Continue diltiazem -Holding apixaban secondary to forehead hematoma  Chronic diastolic CHF -She appears clinically euvolemic -Daily weights -Continue furosemide  COPD/bronchiectasis -Stable at this time on room air -Start duo nebs -Continue Symbicort  Depression/anxiety -Continue home dose of Cymbalta, Traxene  Hypothyroidism -Continue Synthroid   Active Problems:   Cellulitis, leg       Past Medical History:  Diagnosis Date  . Anxiety   . Arthritis   . CHF (congestive heart failure) ([place])   . Complication of anesthesia    " I have to have a spinal beacuse my lungs & heart are bad "  . COPD (chronic obstructive pulmonary disease) ([place])   . Coronary artery disease   . Depression   . Fibromyalgia   . Hiatal hernia   . Hyperlipidemia   . Irregular heartbeat   . On home oxygen therapy    "2L at night and prn" (03/29/2018)  . Osteoporosis   . Peripheral neuropathy 08/30/2017  . Rotator cuff tear    RIGHT  . UTI (urinary tract infection) 09/2014   Past Surgical History:  Procedure Laterality Date  . APPENDECTOMY    .  CATARACT EXTRACTION Left   . DILATION AND CURETTAGE OF UTERUS     x2  . ELBOW SURGERY     Right  . HIP ARTHROPLASTY Right 09/22/2014   Procedure: ARTHROPLASTY BIPOLAR HIP;  Surgeon: [name], MD;  Location: [place];  Service: Orthopedics;  Laterality: Right;  . TONSILLECTOMY    . TOTAL HIP REVISION Right 10/13/2014   Procedure: REVISION RIGHT TOTAL  HIP POSTERIOR ;  Surgeon: Rod Can, MD;  Location: [place];  Service: Orthopedics;  Laterality: Right;   Social History:  reports that she has never smoked. She has never used smokeless tobacco. She reports that she does not drink alcohol or use drugs.   Family History  Problem Relation Age of Onset  . Pancreatic cancer Father   . Heart failure Mother   . Hypertension Mother   . Heart attack Brother   . Hypertension Brother   . Stroke Brother   . Heart attack Sister   . Diabetes Son   . Obesity Son   . Heart attack Son      Allergies  Allergen Reactions  . Shrimp [Shellfish Allergy] Nausea And Vomiting, Swelling and Other (See Comments)    Made her run a fever also  . Fish Allergy Nausea And Vomiting, Swelling and Other (See Comments)    Extremely sick  . Adhesive [Tape] Other (See Comments)    Tape BRUISES and TEARS THE SKIN; please use an alternative!!  . Other Other (See Comments)    Does not remember which "mycin" drug = yeast infection No seeds or nuts = Digestive isues     Prior to Admission medications   Medication Sig Start Date End Date Taking? Authorizing Provider  acetaminophen (TYLENOL) 500 MG tablet Take 2 tablets (1,000 mg total) by mouth every 8 (eight) hours. Patient taking differently: Take 1,000 mg by mouth every 8 (eight) hours as needed for mild pain or headache.  10/15/14   Swinteck, Aaron Edelman, MD  albuterol (PROVENTIL HFA;VENTOLIN HFA) 108 (90 Base) MCG/ACT inhaler Inhale 2 puffs into the lungs every 6 (six) hours as needed. Patient taking differently: Inhale 2 puffs into the lungs every 6 (six) hours as needed for wheezing or shortness of breath.  01/21/17   Juanito Doom, MD  bisacodyl (DULCOLAX) 10 MG suppository Place 1 suppository (10 mg total) rectally daily as needed for moderate constipation. 04/06/18   Bonnielee Haff, MD  budesonide-formoterol [facility]) 80-4.5 MCG/ACT inhaler Inhale 2 puffs into the lungs 2 (two) times daily. Patient taking  differently: Inhale 2 puffs into the lungs 2 (two) times daily as needed (for "flares").  09/21/17   Tanda Rockers, MD  Cholecalciferol (VITAMIN D3) 1000 UNITS CAPS Take 1,000 Units by mouth daily.     [provider]  clorazepate (TRANXENE) 7.5 MG tablet Take 7.5 mg by mouth at bedtime.     [provider]  diltiazem (CARDIZEM CD) 360 MG 24 hr capsule Take 1 capsule (360 mg total) by mouth daily. 04/06/18   Bonnielee Haff, MD  DULoxetine (CYMBALTA) 30 MG capsule Take 1 capsule (30 mg total) by mouth at bedtime. Patient not taking: Reported on 06/25/2018 04/19/18   Kathrynn Ducking, MD  ELIQUIS 2.5 MG TABS tablet TAKE 1 TABLET BY MOUTH TWICE DAILY Patient taking differently: Take 2.5 mg by mouth 2 (two) times daily.  06/20/18   Park Liter, MD  folic acid (FOLVITE) 174 MCG tablet Take 400 mcg by mouth daily.    [provider]  furosemide (LASIX) 20 MG tablet Take 2 tablets (40 mg total) by mouth daily. 06/17/18   Park Liter, MD  hydrocortisone (ANUSOL-HC) 25 MG suppository Place 25 mg rectally as needed for hemorrhoids.     [provider]  hydroxypropyl methylcellulose (ISOPTO TEARS) 2.5 % ophthalmic solution Place 1 drop into both eyes 3 (three) times daily as needed for dry eyes.     [provider]  levothyroxine (SYNTHROID, LEVOTHROID) 75 MCG tablet Take 75 mcg by mouth daily.      [provider]  magnesium hydroxide (MILK OF MAGNESIA) 400 MG/5ML suspension Take 5-15 mLs by mouth daily as needed for mild constipation.     [provider]  Multiple Vitamins-Minerals (ICAPS AREDS 2) CAPS Take 1 capsule by mouth 2 (two) times daily.     [provider]  nitroGLYCERIN (NITROSTAT) 0.4 MG SL tablet Place 0.4 mg under the tongue every 5 (five) minutes as needed for chest pain.     [provider]  ondansetron (ZOFRAN) 4 MG tablet TAKE 1 TABLET BY MOUTH EVERY 8 HOURS AS NEEDED FOR NAUSEA OR  VOMITING Patient taking differently: Take 4 mg by mouth every 8 (eight) hours as needed for nausea or vomiting.  04/14/18   Zehr, Laban Emperor, PA-C  OXYGEN Inhale 2 L into the lungs at bedtime. As instructed    [provider]  Polyethyl Glycol-Propyl Glycol (SYSTANE) 0.4-0.3 % SOLN Place 1 drop into both eyes at bedtime as needed (for dry eyes).     [provider]  PROCTOZONE-HC 2.5 % rectal cream Place 1 application rectally 2 (two) times daily.  06/20/18   [provider]  sodium chloride HYPERTONIC 3 % nebulizer solution Take by nebulization 2 (two) times daily. Patient taking differently: Take 4 mLs by nebulization 2 (two) times daily as needed for other or cough.  01/21/17   Juanito Doom, MD  trimethoprim-polymyxin b (POLYTRIM) ophthalmic solution Place 1 drop into the left eye See admin instructions. Instill 1 drop into the left eye three times a day for the 1st day and 1 drop four times a day on the 2nd day AFTER INJECTION 03/14/18   [provider]  vitamin B-12 (CYANOCOBALAMIN) 1000 MCG tablet Take 1,000 mcg by mouth daily.    [provider]    Review of Systems:  Constitutional:  No weight loss, night sweats, Fevers, chills, fatigue.  Head&Eyes: No headache.  No vision loss.  No  scotoma ENT:  No Difficulty swallowing,Tooth/dental problems,Sore throat,  No ear ache, post nasal drip,  Cardio-vascular:  No chest pain, Orthopnea, PND, swelling in lower extremities,  dizziness, palpitations  GI:  No  abdominal pain, nausea, vomiting, diarrhea, loss of appetite, hematochezia, melena, heartburn, indigestion, Resp:  No shortness of breath with exertion or at rest. No cough. No coughing up of blood .No wheezing.No chest wall deformity  Skin:  no rash or lesions.  GU:  no dysuria, change in color of urine, no urgency or frequency. No flank pain.  Musculoskeletal:  No joint pain or swelling. No decreased range of motion. No back pain.   Psych:  No change in mood or affect. No depression or anxiety. Neurologic: No headache, no dysesthesia, no focal weakness, no vision loss. No syncope  Physical Exam: Vitals:   06/28/18 1030 06/28/18 1100 06/28/18 1130 06/28/18 1200  BP: 136/67 116/77 116/77 (!) 134/93  Pulse: 82 (!) 106 (!) 104 95  Resp: 18 (!) 22 20 17  Temp:      TempSrc:      SpO2: 92% 92% 91% 94%  Weight:      Height:       General:  A&O x 3, NAD, nontoxic, pleasant/cooperative Head/Eye: No conjunctival hemorrhage, no icterus, No nystagmus; hematoma edema of the left forehead and left maxillary area. ENT:  No icterus,  No thrush, good dentition, no pharyngeal exudate Neck:  No masses, no lymphadenpathy, no bruits CV:  RRR, no rub, no gallop, no S3 Lung: Bibasilar crackles, left greater than right.  No wheezing.  Good air movement. Abdomen: soft/NT, +BS, nondistended, no peritoneal signs Ext: No cyanosis, No rashes, No petechiae, No lymphangitis, edema and erythema of the right lower extremity extending from the infrapatellar area to the ankle.  No draining wounds.  No crepitance.  See pictures below Neuro: CNII-XII intact, strength 4/5 in bilateral upper and lower extremities, no dysmetria      Labs on Admission:  Basic Metabolic Panel: Recent Labs  Lab 06/28/18 0905  NA 140  K 3.5  CL 96*  CO2 35*  GLUCOSE 93  BUN 19  CREATININE 0.98  CALCIUM 9.2   Liver Function Tests: No results for input(s): AST, ALT, ALKPHOS, BILITOT, PROT, ALBUMIN in the last 168 hours. No results for input(s): LIPASE, AMYLASE in the last 168 hours. No results for input(s): AMMONIA in the last 168 hours. CBC: Recent Labs  Lab 06/28/18 0905  WBC 7.0  NEUTROABS 5.3  HGB 11.6*  HCT 38.2  MCV 101.1*  PLT 179   Coagulation Profile: No results for input(s): INR, PROTIME in the last 168 hours. Cardiac Enzymes: No results for input(s): CKTOTAL, CKMB, CKMBINDEX, TROPONINI in the last 168 hours. BNP: Invalid  input(s): POCBNP CBG: Recent Labs  Lab 06/25/18 1959  GLUCAP 102*   Urine analysis:    Component Value Date/Time   COLORURINE COLORLESS (A) 06/28/2018 0826   APPEARANCEUR CLEAR 06/28/2018 0826   LABSPEC 1.005 06/28/2018 0826   PHURINE 8.0 06/28/2018 0826   GLUCOSEU NEGATIVE 06/28/2018 0826   HGBUR NEGATIVE 06/28/2018 0826   BILIRUBINUR NEGATIVE 06/28/2018 0826   KETONESUR NEGATIVE 06/28/2018 0826   PROTEINUR NEGATIVE 06/28/2018 0826   UROBILINOGEN 0.2 09/21/2014 1325   NITRITE NEGATIVE 06/28/2018 0826   LEUKOCYTESUR NEGATIVE 06/28/2018 0826   Sepsis Labs: @LABRCNTIP (procalcitonin:4,lacticidven:4) )No results found for this or any previous visit (from the past 240 hour(s)).   Radiological Exams on Admission: Ct Head Wo Contrast  Result Date: 06/28/2018 CLINICAL DATA:  Pain following fall EXAM: CT HEAD WITHOUT CONTRAST TECHNIQUE: Contiguous axial images were obtained from the base of the skull through the vertex without intravenous contrast. COMPARISON:  June 25, 2018 FINDINGS: Brain: Age related volume loss is stable. There is no intracranial mass, hemorrhage, extra-axial fluid collection, or midline shift. Small vessel disease throughout the centra semiovale bilaterally appears stable. No acute appearing infarct is evident. Vascular: There is no hyperdense vessel. There is calcification in each carotid siphon region. Skull: There is a left frontal scalp hematoma. No fracture evident. The bony calvarium appears intact. Sinuses/Orbits: There is a small retention cyst in the inferior posterior right maxillary antrum. There is mucosal thickening in several ethmoid air cells bilaterally. Patient is status post cataract removal on the left. Orbits otherwise appear symmetric bilaterally. Note that there is preseptal soft tissue swelling over the left orbit. Other: Mastoid air cells are clear. Torus palatinus noted incidentally, an anatomic variant. IMPRESSION: Stable age related volume  loss with supratentorial small vessel disease.  No acute infarct evident. No mass or hemorrhage. Left frontal scalp hematoma.  No fracture evident. Foci of arterial vascular calcification noted. Areas of paranasal sinus disease noted. Electronically Signed   By: Lowella Grip III M.D.   On: 06/28/2018 09:09    EKG: Independently reviewed. Sinus, no STT changes    Time spent:60 minutes Code Status:   FULL Family Communication:  Spouse updated at bedside Disposition Plan: expect 1-2 day hospitalization Consults called: none DVT Prophylaxis: apixaban--on hold   Orson Eva, DO  [facility] Pager [phone]  If 7PM-7AM, please contact night-coverage [email or web address] Password [facility] 06/28/2018, 12:32 PM

## 2018-06-28 NOTE — ED Provider Notes (Signed)
[facility] EMERGENCY DEPARTMENT Provider Note   CSN: [id number] Arrival date & time: 06/28/18  3154     History   Chief Complaint Chief Complaint  Patient presents with  . Fall    HPI [name] is a 84 y.o. female.  Level 5 caveat for urgent need for intervention.  Patient sustained an accidental fall at home today striking her left forehead.  No loss of consciousness or neurological deficits.  She complains of a laceration on her left eyebrow, weakness, redness in her right lower extremity.  No chest pain, dyspnea, dysuria, fever, sweats, chills..  She lives independently at home with her husband.  She has fallen several times recently.     Past Medical History:  Diagnosis Date  . Anxiety   . Arthritis   . CHF (congestive heart failure) ([place])   . Complication of anesthesia    " I have to have a spinal beacuse my lungs & heart are bad "  . COPD (chronic obstructive pulmonary disease) ([place])   . Coronary artery disease   . Depression   . Fibromyalgia   . Hiatal hernia   . Hyperlipidemia   . Irregular heartbeat   . On home oxygen therapy    "2L at night and prn" (03/29/2018)  . Osteoporosis   . Peripheral neuropathy 08/30/2017  . Rotator cuff tear    RIGHT  . UTI (urinary tract infection) 09/2014    Patient Active Problem List   Diagnosis Date Noted  . Poor appetite 05/04/2018  . Atrial fibrillation with RVR ([place]) 03/30/2018  . Dysphagia 03/30/2018  . Diastolic CHF ([place]) 00/86/7619  . Abdominal distension (gaseous) 01/20/2018  . Loss of weight 01/20/2018  . Nausea and vomiting 01/20/2018  . Constipation 01/20/2018  . Peripheral neuropathy 08/30/2017  . Peri-prosthetic fracture of femur following total hip arthroplasty 10/13/2014  . Protein-calorie malnutrition, severe ([place]) 10/12/2014  . Periprosthetic fracture around internal prosthetic right hip joint ([place]) 10/12/2014  . COPD exacerbation ([place])   . Acute cystitis without hematuria   .  Hypothyroidism   . Hip fracture ([place]) 09/21/2014  . Fall at home 09/21/2014  . Syncope and collapse 07/02/2014  . Osteoporosis 04/10/2013  . Vaginal atrophy 04/10/2013  . Female pelvic pain 04/10/2013  . Hemoptysis, unspecified 10/12/2011  . Bronchiectasis without acute exacerbation ([place]) 05/19/2011    Past Surgical History:  Procedure Laterality Date  . APPENDECTOMY    . CATARACT EXTRACTION Left   . DILATION AND CURETTAGE OF UTERUS     x2  . ELBOW SURGERY     Right  . HIP ARTHROPLASTY Right 09/22/2014   Procedure: ARTHROPLASTY BIPOLAR HIP;  Surgeon: [name], MD;  Location: [facility];  Service: Orthopedics;  Laterality: Right;  . TONSILLECTOMY    . TOTAL HIP REVISION Right 10/13/2014   Procedure: REVISION RIGHT TOTAL HIP POSTERIOR ;  Surgeon: Rod Can, MD;  Location: [facility];  Service: Orthopedics;  Laterality: Right;     OB History    Gravida  1   Para  1   Term      Preterm      AB      Living  1     SAB      TAB      Ectopic      Multiple      Live Births               Home Medications    Prior to Admission medications  Medication Sig Start Date End Date Taking? Authorizing Provider  acetaminophen (TYLENOL) 500 MG tablet Take 2 tablets (1,000 mg total) by mouth every 8 (eight) hours. Patient taking differently: Take 1,000 mg by mouth every 8 (eight) hours as needed for mild pain or headache.  10/15/14   Swinteck, Aaron Edelman, MD  albuterol (PROVENTIL HFA;VENTOLIN HFA) 108 (90 Base) MCG/ACT inhaler Inhale 2 puffs into the lungs every 6 (six) hours as needed. Patient taking differently: Inhale 2 puffs into the lungs every 6 (six) hours as needed for wheezing or shortness of breath.  01/21/17   Juanito Doom, MD  bisacodyl (DULCOLAX) 10 MG suppository Place 1 suppository (10 mg total) rectally daily as needed for moderate constipation. 04/06/18   Bonnielee Haff, MD  budesonide-formoterol [facility]) 80-4.5 MCG/ACT inhaler Inhale 2 puffs into the  lungs 2 (two) times daily. Patient taking differently: Inhale 2 puffs into the lungs 2 (two) times daily as needed (for "flares").  09/21/17   Tanda Rockers, MD  Cholecalciferol (VITAMIN D3) 1000 UNITS CAPS Take 1,000 Units by mouth daily.     [provider]  clorazepate (TRANXENE) 7.5 MG tablet Take 7.5 mg by mouth at bedtime.     [provider]  diltiazem (CARDIZEM CD) 360 MG 24 hr capsule Take 1 capsule (360 mg total) by mouth daily. 04/06/18   Bonnielee Haff, MD  DULoxetine (CYMBALTA) 30 MG capsule Take 1 capsule (30 mg total) by mouth at bedtime. Patient not taking: Reported on 06/25/2018 04/19/18   Kathrynn Ducking, MD  ELIQUIS 2.5 MG TABS tablet TAKE 1 TABLET BY MOUTH TWICE DAILY Patient taking differently: Take 2.5 mg by mouth 2 (two) times daily.  06/20/18   Park Liter, MD  folic acid (FOLVITE) 301 MCG tablet Take 400 mcg by mouth daily.    [provider]  furosemide (LASIX) 20 MG tablet Take 2 tablets (40 mg total) by mouth daily. 06/17/18   Park Liter, MD  hydrocortisone (ANUSOL-HC) 25 MG suppository Place 25 mg rectally as needed for hemorrhoids.     [provider]  hydroxypropyl methylcellulose (ISOPTO TEARS) 2.5 % ophthalmic solution Place 1 drop into both eyes 3 (three) times daily as needed for dry eyes.     [provider]  levothyroxine (SYNTHROID, LEVOTHROID) 75 MCG tablet Take 75 mcg by mouth daily.      [provider]  magnesium hydroxide (MILK OF MAGNESIA) 400 MG/5ML suspension Take 5-15 mLs by mouth daily as needed for mild constipation.     [provider]  Multiple Vitamins-Minerals (ICAPS AREDS 2) CAPS Take 1 capsule by mouth 2 (two) times daily.     [provider]  nitroGLYCERIN (NITROSTAT) 0.4 MG SL tablet Place 0.4 mg under the tongue every 5 (five) minutes as needed for chest pain.     [provider]  ondansetron (ZOFRAN) 4 MG tablet TAKE 1 TABLET BY MOUTH EVERY 8  HOURS AS NEEDED FOR NAUSEA OR VOMITING Patient taking differently: Take 4 mg by mouth every 8 (eight) hours as needed for nausea or vomiting.  04/14/18   Zehr, Laban Emperor, PA-C  OXYGEN Inhale 2 L into the lungs at bedtime. As instructed    [provider]  Polyethyl Glycol-Propyl Glycol (SYSTANE) 0.4-0.3 % SOLN Place 1 drop into both eyes at bedtime as needed (for dry eyes).     [provider]  PROCTOZONE-HC 2.5 % rectal cream Place 1 application rectally 2 (two) times daily.  06/20/18  [provider]  sodium chloride HYPERTONIC 3 % nebulizer solution Take by nebulization 2 (two) times daily. Patient taking differently: Take 4 mLs by nebulization 2 (two) times daily as needed for other or cough.  01/21/17   Juanito Doom, MD  trimethoprim-polymyxin b (POLYTRIM) ophthalmic solution Place 1 drop into the left eye See admin instructions. Instill 1 drop into the left eye three times a day for the 1st day and 1 drop four times a day on the 2nd day AFTER INJECTION 03/14/18   [provider]  vitamin B-12 (CYANOCOBALAMIN) 1000 MCG tablet Take 1,000 mcg by mouth daily.    [provider]    Family History Family History  Problem Relation Age of Onset  . Pancreatic cancer Father   . Heart failure Mother   . Hypertension Mother   . Heart attack Brother   . Hypertension Brother   . Stroke Brother   . Heart attack Sister   . Diabetes Son   . Obesity Son   . Heart attack Son     Social History Social History   Tobacco Use  . Smoking status: Never Smoker  . Smokeless tobacco: Never Used  Substance Use Topics  . Alcohol use: No  . Drug use: No     Allergies   Shrimp [shellfish allergy]; Fish allergy; Adhesive [tape]; and Other   Review of Systems Review of Systems  Unable to perform ROS: Acuity of condition     Physical Exam Updated Vital Signs BP 116/77   Pulse (!) 104   Temp 97.8 F (36.6 C) (Oral)   Resp 20   Ht 5\' 7"  (1.702  m)   Wt 49 kg   SpO2 91%   BMI 16.92 kg/m   Physical Exam  Constitutional: She is oriented to person, place, and time.  Alert  HENT:  Head: Normocephalic.  Significant left periorbital ecchymosis; 2.5 cm laceration superior to left eyebrow.  Eyes: Conjunctivae are normal.  Neck: Neck supple.  Cardiovascular: Normal rate and regular rhythm.  Pulmonary/Chest: Effort normal and breath sounds normal.  Abdominal: Soft. Bowel sounds are normal.  Musculoskeletal: Normal range of motion.  Neurological: She is alert and oriented to person, place, and time.  Skin:  Right lower extremity: Area of erythema, edema, tenderness from the proximal tibia distally.  Psychiatric: She has a normal mood and affect. Her behavior is normal.  Nursing note and vitals reviewed.    ED Treatments / Results  Labs (all labs ordered are listed, but only abnormal results are displayed) Labs Reviewed  CBC WITH DIFFERENTIAL/PLATELET - Abnormal; Notable for the following components:      Result Value   RBC 3.78 (*)    Hemoglobin 11.6 (*)    MCV 101.1 (*)    All other components within normal limits  BASIC METABOLIC PANEL - Abnormal; Notable for the following components:   Chloride 96 (*)    CO2 35 (*)    GFR calc non Af Amer 51 (*)    GFR calc Af Amer 59 (*)    All other components within normal limits  URINALYSIS, ROUTINE W REFLEX MICROSCOPIC - Abnormal; Notable for the following components:   Color, Urine COLORLESS (*)    All other components within normal limits    EKG EKG Interpretation  Date/Time:  Tuesday June 28 2018 08:14:50 EST Ventricular Rate:  83 PR Interval:    QRS Duration: 112 QT Interval:  381 QTC Calculation: 448 R Axis:   -8  Text Interpretation:  Sinus rhythm Ventricular trigeminy Borderline intraventricular conduction delay Nonspecific T abnormalities, lateral leads Artifact in lead(s) I II III aVR aVL aVF V1 V2 V3 V4 V5 V6 Confirmed by Nat Christen [phone]) on 06/28/2018  9:19:17 AM   Radiology Ct Head Wo Contrast  Result Date: 06/28/2018 CLINICAL DATA:  Pain following fall EXAM: CT HEAD WITHOUT CONTRAST TECHNIQUE: Contiguous axial images were obtained from the base of the skull through the vertex without intravenous contrast. COMPARISON:  June 25, 2018 FINDINGS: Brain: Age related volume loss is stable. There is no intracranial mass, hemorrhage, extra-axial fluid collection, or midline shift. Small vessel disease throughout the centra semiovale bilaterally appears stable. No acute appearing infarct is evident. Vascular: There is no hyperdense vessel. There is calcification in each carotid siphon region. Skull: There is a left frontal scalp hematoma. No fracture evident. The bony calvarium appears intact. Sinuses/Orbits: There is a small retention cyst in the inferior posterior right maxillary antrum. There is mucosal thickening in several ethmoid air cells bilaterally. Patient is status post cataract removal on the left. Orbits otherwise appear symmetric bilaterally. Note that there is preseptal soft tissue swelling over the left orbit. Other: Mastoid air cells are clear. Torus palatinus noted incidentally, an anatomic variant. IMPRESSION: Stable age related volume loss with supratentorial small vessel disease. No acute infarct evident. No mass or hemorrhage. Left frontal scalp hematoma.  No fracture evident. Foci of arterial vascular calcification noted. Areas of paranasal sinus disease noted. Electronically Signed   By: Lowella Grip III M.D.   On: 06/28/2018 09:09    Procedures Procedures (including critical care time)  Medications Ordered in ED Medications  vancomycin (VANCOCIN) IVPB 1000 mg/200 mL premix (has no administration in time range)  lidocaine-EPINEPHrine (XYLOCAINE W/EPI) 2 %-1:200000 (PF) injection 10 mL (10 mLs Infiltration Given by Other 06/28/18 0905)  sodium chloride 0.9 % bolus 500 mL (0 mLs Intravenous Stopped 06/28/18 1005)      Initial Impression / Assessment and Plan / ED Course  I have reviewed the triage vital signs and the nursing notes.  Pertinent labs & imaging results that were available during my care of the patient were reviewed by me and considered in my medical decision making (see chart for details).     Patient presents with fall with a subsequent laceration of the face.  Additionally she has a cellulitis of her right lower extremity.  Labs are reassuring.  IV vancomycin, surgical repair per physician assistant, admit to general medicine   CRITICAL CARE Performed by: Nat Christen Total critical care time: 30 minutes Critical care time was exclusive of separately billable procedures and treating other patients. Critical care was necessary to treat or prevent imminent or life-threatening deterioration. Critical care was time spent personally by me on the following activities: development of treatment plan with patient and/or surrogate as well as nursing, discussions with consultants, evaluation of patient's response to treatment, examination of patient, obtaining history from patient or surrogate, ordering and performing treatments and interventions, ordering and review of laboratory studies, ordering and review of radiographic studies, pulse oximetry and re-evaluation of patient's condition.  Final Clinical Impressions(s) / ED Diagnoses   Final diagnoses:  Fall, initial encounter  Cellulitis of right lower extremity  Facial laceration, initial encounter    ED Discharge Orders    None       Nat Christen, MD 06/28/18 1217

## 2018-06-29 DIAGNOSIS — W19XXXD Unspecified fall, subsequent encounter: Secondary | ICD-10-CM

## 2018-06-29 DIAGNOSIS — E43 Unspecified severe protein-calorie malnutrition: Secondary | ICD-10-CM | POA: Diagnosis present

## 2018-06-29 DIAGNOSIS — L03119 Cellulitis of unspecified part of limb: Secondary | ICD-10-CM | POA: Diagnosis not present

## 2018-06-29 DIAGNOSIS — J479 Bronchiectasis, uncomplicated: Secondary | ICD-10-CM | POA: Diagnosis present

## 2018-06-29 DIAGNOSIS — K5909 Other constipation: Secondary | ICD-10-CM | POA: Diagnosis present

## 2018-06-29 DIAGNOSIS — S0181XD Laceration without foreign body of other part of head, subsequent encounter: Secondary | ICD-10-CM | POA: Diagnosis not present

## 2018-06-29 DIAGNOSIS — E876 Hypokalemia: Secondary | ICD-10-CM

## 2018-06-29 DIAGNOSIS — D510 Vitamin B12 deficiency anemia due to intrinsic factor deficiency: Secondary | ICD-10-CM | POA: Diagnosis present

## 2018-06-29 DIAGNOSIS — M810 Age-related osteoporosis without current pathological fracture: Secondary | ICD-10-CM | POA: Diagnosis present

## 2018-06-29 DIAGNOSIS — K649 Unspecified hemorrhoids: Secondary | ICD-10-CM | POA: Diagnosis present

## 2018-06-29 DIAGNOSIS — L03115 Cellulitis of right lower limb: Secondary | ICD-10-CM | POA: Diagnosis present

## 2018-06-29 DIAGNOSIS — M797 Fibromyalgia: Secondary | ICD-10-CM | POA: Diagnosis present

## 2018-06-29 DIAGNOSIS — I878 Other specified disorders of veins: Secondary | ICD-10-CM | POA: Diagnosis not present

## 2018-06-29 DIAGNOSIS — I5032 Chronic diastolic (congestive) heart failure: Secondary | ICD-10-CM | POA: Diagnosis present

## 2018-06-29 DIAGNOSIS — I872 Venous insufficiency (chronic) (peripheral): Secondary | ICD-10-CM | POA: Diagnosis present

## 2018-06-29 DIAGNOSIS — Z681 Body mass index (BMI) 19 or less, adult: Secondary | ICD-10-CM | POA: Diagnosis not present

## 2018-06-29 DIAGNOSIS — K922 Gastrointestinal hemorrhage, unspecified: Secondary | ICD-10-CM | POA: Diagnosis not present

## 2018-06-29 DIAGNOSIS — E785 Hyperlipidemia, unspecified: Secondary | ICD-10-CM | POA: Diagnosis present

## 2018-06-29 DIAGNOSIS — J9610 Chronic respiratory failure, unspecified whether with hypoxia or hypercapnia: Secondary | ICD-10-CM | POA: Diagnosis present

## 2018-06-29 DIAGNOSIS — F329 Major depressive disorder, single episode, unspecified: Secondary | ICD-10-CM | POA: Diagnosis present

## 2018-06-29 DIAGNOSIS — H3530 Unspecified macular degeneration: Secondary | ICD-10-CM | POA: Diagnosis present

## 2018-06-29 DIAGNOSIS — F419 Anxiety disorder, unspecified: Secondary | ICD-10-CM | POA: Diagnosis present

## 2018-06-29 DIAGNOSIS — R627 Adult failure to thrive: Secondary | ICD-10-CM | POA: Diagnosis present

## 2018-06-29 DIAGNOSIS — R296 Repeated falls: Secondary | ICD-10-CM | POA: Diagnosis present

## 2018-06-29 DIAGNOSIS — E039 Hypothyroidism, unspecified: Secondary | ICD-10-CM | POA: Diagnosis present

## 2018-06-29 DIAGNOSIS — I2510 Atherosclerotic heart disease of native coronary artery without angina pectoris: Secondary | ICD-10-CM | POA: Diagnosis present

## 2018-06-29 DIAGNOSIS — I739 Peripheral vascular disease, unspecified: Secondary | ICD-10-CM | POA: Diagnosis present

## 2018-06-29 DIAGNOSIS — W010XXA Fall on same level from slipping, tripping and stumbling without subsequent striking against object, initial encounter: Secondary | ICD-10-CM | POA: Diagnosis present

## 2018-06-29 DIAGNOSIS — S01112A Laceration without foreign body of left eyelid and periocular area, initial encounter: Secondary | ICD-10-CM | POA: Diagnosis present

## 2018-06-29 DIAGNOSIS — I480 Paroxysmal atrial fibrillation: Secondary | ICD-10-CM | POA: Diagnosis not present

## 2018-06-29 LAB — CBC
HEMATOCRIT: 34.8 % — AB (ref 36.0–46.0)
HEMOGLOBIN: 10.8 g/dL — AB (ref 12.0–15.0)
MCH: 31.4 pg (ref 26.0–34.0)
MCHC: 31 g/dL (ref 30.0–36.0)
MCV: 101.2 fL — AB (ref 80.0–100.0)
NRBC: 0 % (ref 0.0–0.2)
Platelets: 167 10*3/uL (ref 150–400)
RBC: 3.44 MIL/uL — ABNORMAL LOW (ref 3.87–5.11)
RDW: 14.6 % (ref 11.5–15.5)
WBC: 6.3 10*3/uL (ref 4.0–10.5)

## 2018-06-29 LAB — BASIC METABOLIC PANEL
Anion gap: 8 (ref 5–15)
BUN: 14 mg/dL (ref 8–23)
CHLORIDE: 101 mmol/L (ref 98–111)
CO2: 32 mmol/L (ref 22–32)
Calcium: 8.3 mg/dL — ABNORMAL LOW (ref 8.9–10.3)
Creatinine, Ser: 0.83 mg/dL (ref 0.44–1.00)
GFR calc non Af Amer: >60 mL/min (ref >60)
Glucose, Bld: 91 mg/dL (ref 70–99)
POTASSIUM: 3.2 mmol/L — AB (ref 3.5–5.1)
SODIUM: 141 mmol/L (ref 135–145)

## 2018-06-29 LAB — OCCULT BLOOD X 1 CARD TO LAB, STOOL: Fecal Occult Bld: POSITIVE — AB

## 2018-06-29 MED ORDER — GABAPENTIN 300 MG PO CAPS
300.0000 mg | ORAL_CAPSULE | Freq: Once | ORAL | Status: AC
  Administered 2018-06-29: 300 mg via ORAL
  Filled 2018-06-29: qty 1

## 2018-06-29 MED ORDER — POTASSIUM CHLORIDE CRYS ER 20 MEQ PO TBCR
40.0000 meq | EXTENDED_RELEASE_TABLET | Freq: Once | ORAL | Status: AC
  Administered 2018-06-29: 40 meq via ORAL
  Filled 2018-06-29: qty 2

## 2018-06-29 NOTE — Care Management Note (Signed)
Case Management Note  Patient Details  Name: [name] MRN: [id number] Date of Birth: 03/13/1927  Subjective/Objective:    Falls, cellulitis. [place] home with husband. Independent, walks with cane. PTA has hospital bed and BSC. Recommended for home health PT. Agreeable, elects [facility], has used them in the past.                 Action/Plan: DC home with home health. [facility] notified and will obtain orders via Epic.   Expected Discharge Date:       07/01/2018           Expected Discharge Plan:  [place]  In-House Referral:     Discharge planning Services  CM Consult  Post Acute Care Choice:  Home Health Choice offered to:  Patient, Spouse  DME Arranged:    DME Agency:     HH Arranged:  PT [place]:  [place]  Status of Service:  Completed, signed off  If discussed at [place] of Stay Meetings, dates discussed:    Additional Comments:  Madyx Delfin, Chauncey Reading, RN 06/29/2018, 12:41 PM

## 2018-06-29 NOTE — Evaluation (Signed)
Physical Therapy Evaluation Patient Details Name: [name] MRN: [id number] DOB: 1927/02/09 Today's Date: 06/29/2018   History of Present Illness  [name] is a 84 y.o. female with medical history of paroxysmal atrial fibrillation, dCHF, COPD, coronary disease, pernicious anemia, chronic respiratory failure on 2 L at nighttime, anxiety presenting after mechanical fall on the morning of 06/28/2018.  Patient was getting up off the bedside commode when she tripped and fell onto her face.  She did not lose consciousness.  The patient was in the emergency department on 06/25/2018 after sustaining a mechanical fall at that time, falling onto her face.  Repaired at that time as well as a laceration above her left eyebrow.  Steri-Strips with her skin was too thin for sutures.  The patient did not lose consciousness during the fall.  The patient denied any other falls in the past month.  The patient denied any fevers, chills, chest pain, shortness breath, aura, nausea, vomiting, diarrhea, abdominal pain.  She complains of some generalized weakness since starting on furosemide when she was discharged from the hospital on 04/06/2018.    Clinical Impression  Patient demonstrates good return for getting into/out of bed, slightly unsteady slow cadence ambulating with tripod cane, no loss of balance, but limited secondary to c/o fatigue.  Patient tolerated sitting up at bedside with spouse present in room after therapy.  Patient will benefit from continued physical therapy in hospital and recommended venue below to increase strength, balance, endurance for safe ADLs and gait.    Follow Up Recommendations Home health PT;Supervision/Assistance - 24 hour;Supervision for mobility/OOB    Equipment Recommendations  None recommended by PT    Recommendations for Other Services       Precautions / Restrictions Precautions Precautions: Fall Restrictions Weight Bearing Restrictions: No       Mobility  Bed Mobility Overal bed mobility: Modified Independent             General bed mobility comments: increased time  Transfers Overall transfer level: Needs assistance Equipment used: Straight cane;1 person hand held assist Transfers: Sit to/from Stand;Stand Pivot Transfers Sit to Stand: Supervision Stand pivot transfers: Min guard       General transfer comment: slightly labored movement  Ambulation/Gait Ambulation/Gait assistance: Min guard Gait Distance (Feet): 30 Feet Assistive device: Straight cane Gait Pattern/deviations: Step-to pattern;Decreased step length - left;Decreased stance time - right;Decreased stride length Gait velocity: slow   General Gait Details: slow slightly labored cadence with mostly 3 point gait pattern using SPC, no loss of balance, limited secondary to c/o fatigue  Stairs            Wheelchair Mobility    Modified Rankin (Stroke Patients Only)       Balance Overall balance assessment: Needs assistance Sitting-balance support: Feet unsupported;No upper extremity supported Sitting balance-Leahy Scale: Good     Standing balance support: During functional activity;Single extremity supported Standing balance-Leahy Scale: Fair Standing balance comment: using SPC                             Pertinent Vitals/Pain Pain Assessment: No/denies pain    Home Living Family/patient expects to be discharged to:: Private residence Living Arrangements: Spouse/significant other Available Help at Discharge: Family Type of Home: House Home Access: Level entry     Home Layout: One level Home Equipment: Environmental consultant - 2 wheels;Walker - 4 wheels;Cane - single point;Wheelchair - manual;Hospital bed;Bedside commode  Prior Function Level of Independence: Independent with assistive device(s)         Comments: household and short distanced community ambulator with [facility]     Hand Dominance        Extremity/Trunk  Assessment   Upper Extremity Assessment Upper Extremity Assessment: Generalized weakness    Lower Extremity Assessment Lower Extremity Assessment: Generalized weakness    Cervical / Trunk Assessment Cervical / Trunk Assessment: Normal  Communication   Communication: No difficulties  Cognition Arousal/Alertness: Awake/alert Behavior During Therapy: WFL for tasks assessed/performed Overall Cognitive Status: Within Functional Limits for tasks assessed                                        General Comments      Exercises     Assessment/Plan    PT Assessment Patient needs continued PT services  PT Problem List Decreased strength;Decreased activity tolerance;Decreased balance;Decreased mobility       PT Treatment Interventions Gait training;Functional mobility training;Therapeutic activities;Therapeutic exercise;Patient/family education;Stair training    PT Goals (Current goals can be found in the Care Plan section)  Acute Rehab PT Goals Patient Stated Goal: return home with spouse to assist PT Goal Formulation: With patient Time For Goal Achievement: 07/13/18 Potential to Achieve Goals: Good    Frequency Min 3X/week   Barriers to discharge        Co-evaluation               AM-PAC PT "6 Clicks" Mobility  Outcome Measure Help needed turning from your back to your side while in a flat bed without using bedrails?: None Help needed moving from lying on your back to sitting on the side of a flat bed without using bedrails?: None Help needed moving to and from a bed to a chair (including a wheelchair)?: None Help needed standing up from a chair using your arms (e.g., wheelchair or bedside chair)?: A Little Help needed to walk in hospital room?: A Little Help needed climbing 3-5 steps with a railing? : A Little 6 Click Score: 21    End of Session   Activity Tolerance: Patient tolerated treatment well;Patient limited by fatigue Patient left: in  bed;with call bell/phone within reach;with family/visitor present(seated at bedside) Nurse Communication: Mobility status PT Visit Diagnosis: Unsteadiness on feet (R26.81);Other abnormalities of gait and mobility (R26.89);Muscle weakness (generalized) (M62.81)    Time: 9622-2979 PT Time Calculation (min) (ACUTE ONLY): 24 min   Charges:   PT Evaluation $PT Eval Moderate Complexity: 1 Mod PT Treatments $Therapeutic Activity: 23-37 mins        2:14 PM, 06/29/18 Lonell Grandchild, MPT Physical Therapist with [facility] [phone] office [phone] mobile phone w

## 2018-06-29 NOTE — Progress Notes (Addendum)
PROGRESS NOTE                                                                                                                                                                                                             Patient Demographics:    [name], is a 84 y.o. female, DOB - [birth year], WEX:[id number]  Admit date - 06/28/2018   Admitting Physician Orson Eva, MD  Outpatient Primary MD for the patient is [name], MD  LOS - 0  Outpatient Specialists none  Chief Complaint  Patient presents with  . Fall       Brief Narrative  84 year old female with history of paroxysmal A. fib on Eliquis, diastolic CHF, COPD, coronary artery disease, pernicious anemia, chronic respiratory failure on 2 L home O2 and anxiety presented after mechanical fall.  She was getting up off her bedside commode when she tripped and fell onto her face.  Denies loss of consciousness.  She was seen in the ED 3 days prior to that after sustaining a mechanical fall at that time and again fell on her face she had a laceration in her left eyebrow that was repaired with Steri-Strips.  She did not have loss of consciousness at that time as well.  She reports that about a month back she had passed out for reasons unclear.  Both these 2 recent episodes were due to mechanical fall.  She also reports that she has limited vision in her right eye and macular degeneration in the left.  Uses walker and cane on ambulation. She denies any fevers, chills, headache, nausea, vomiting, abdominal pain, diarrhea, chest pain or shortness of breath. She was found to have increased right lower extremity edema and erythema.  In the ED vitals were stable.  Blood work was unremarkable.  Patient admitted for right lower leg cellulitis and frequent falls requiring pending PT evaluation.    Subjective:   Patient has bilateral black eye following recent laceration of the  face.  Denies any chest discomfort, headache or shortness of breath.   Assessment  & Plan :   Principal problem Right leg cellulitis On empiric Ancef.  Symptoms unchanged since admission.  Lower extremity Doppler negative for DVT.  Monitor closely.   Active Problems: Forehead and left facial laceration/hematoma Secondary to mechanical fall.  Head CT negative for intracranial bleed.  Steri-Strip applied in the ED.  Eliquis on hold since admission.  Seen by PT and recommended SNF but patient declines and wants to go home with home health.  She lives with her husband who reports providing close supervision.  Patient instructed to use a walker at all times. Can resume Eliquis upon discharge and reassess as outpatient if patient is unsteady and has frequent falls in future.  Paroxysmal A. fib In sinus rhythm.  Continue Cardizem.  Eliquis held due to forehead hematoma.  Mild drop in H&H possibly due to this.  Monitor closely  Chronic diastolic CHF Euvolemic.  Continue Lasix.  Anxiety and depression Continue Cymbalta and traction.  History of COPD/bronchiectasis Continue PRN nebs and Symbicort.  No acute symptoms and stable on room air.  Hypothyroidism Continue Synthroid  Hypokalemia Replenished    Code Status : Full code  Family Communication  : Husband at bedside  Disposition Plan  : Home possibly tomorrow with home health if cellulitis improves  Barriers For Discharge : Active symptoms  Consults  : None  Procedures  : CT head  DVT Prophylaxis  : Eliquis (on hold)  Lab Results  Component Value Date   PLT 167 06/29/2018    Antibiotics  :    Anti-infectives (From admission, onward)   Start     Dose/Rate Route Frequency Ordered Stop   06/28/18 1400  ceFAZolin (ANCEF) IVPB 1 g/50 mL premix  Status:  Discontinued     1 g 100 mL/hr over 30 Minutes Intravenous Every 8 hours 06/28/18 1334 06/28/18 1342   06/28/18 1345  ceFAZolin (ANCEF) IVPB 1 g/50 mL premix     1  g 100 mL/hr over 30 Minutes Intravenous Every 12 hours 06/28/18 1342     06/28/18 1200  vancomycin (VANCOCIN) IVPB 1000 mg/200 mL premix     1,000 mg 200 mL/hr over 60 Minutes Intravenous  Once 06/28/18 1158 06/28/18 1350        Objective:   Vitals:   06/28/18 2130 06/28/18 2149 06/29/18 0647 06/29/18 0817  BP:  116/89 131/61   Pulse:  87 95   Resp:   16   Temp:  (!) 97.4 F (36.3 C) 97.6 F (36.4 C)   TempSrc:  Oral Oral   SpO2: 96% 94% 100% 98%  Weight:   53.1 kg   Height:        Wt Readings from Last 3 Encounters:  06/29/18 53.1 kg  06/25/18 49.9 kg  06/15/18 50 kg     Intake/Output Summary (Last 24 hours) at 06/29/2018 1249 Last data filed at 06/29/2018 0700 Gross per 24 hour  Intake 421.42 ml  Output 400 ml  Net 21.42 ml     Physical Exam  Gen: not in distress HEENT: Left orbital and forehead hematoma with bilateral black eye, moist mucosa Chest: clear b/l, no added sounds CVS: N S1&S2, no murmurs, rubs or gallop GI: soft, NT, ND, BS+ Musculoskeletal: erythema of the right lower extremity, no tenderness or warmth CNS: Alert and oriented, nonfocal    Data Review:    CBC Recent Labs  Lab 06/28/18 0905 06/29/18 0514  WBC 7.0 6.3  HGB 11.6* 10.8*  HCT 38.2 34.8*  PLT 179 167  MCV 101.1* 101.2*  MCH 30.7 31.4  MCHC 30.4 31.0  RDW 14.6 14.6  LYMPHSABS 0.9  --   MONOABS 0.6  --   EOSABS 0.1  --   BASOSABS 0.0  --     Chemistries  Recent Labs  Lab 06/28/18 0905 06/29/18 0514  NA 140 141  K 3.5 3.2*  CL 96* 101  CO2 35* 32  GLUCOSE 93 91  BUN 19 14  CREATININE 0.98 0.83  CALCIUM 9.2 8.3*   ------------------------------------------------------------------------------------------------------------------ No results for input(s): CHOL, HDL, LDLCALC, TRIG, CHOLHDL, LDLDIRECT in the last 72 hours.  No results found for:  HGBA1C ------------------------------------------------------------------------------------------------------------------ No results for input(s): TSH, T4TOTAL, T3FREE, THYROIDAB in the last 72 hours.  Invalid input(s): FREET3 ------------------------------------------------------------------------------------------------------------------ No results for input(s): VITAMINB12, FOLATE, FERRITIN, TIBC, IRON, RETICCTPCT in the last 72 hours.  Coagulation profile No results for input(s): INR, PROTIME in the last 168 hours.  No results for input(s): DDIMER in the last 72 hours.  Cardiac Enzymes No results for input(s): CKMB, TROPONINI, MYOGLOBIN in the last 168 hours.  Invalid input(s): CK ------------------------------------------------------------------------------------------------------------------    Component Value Date/Time   BNP 604.9 (H) 03/29/2018 1612    Inpatient Medications  Scheduled Meds: . cholecalciferol  1,000 Units Oral Daily  . clorazepate  7.5 mg Oral QHS  . diltiazem  360 mg Oral Daily  . DULoxetine  30 mg Oral QHS  . folic acid  1,478 mcg Oral Daily  . furosemide  40 mg Oral Daily  . ipratropium-albuterol  3 mL Nebulization Q8H  . levothyroxine  75 mcg Oral Daily  . mometasone-formoterol  2 puff Inhalation BID  . multivitamin-lutein   Oral BID  . vitamin B-12  1,000 mcg Oral Daily   Continuous Infusions: . sodium chloride 10 mL/hr at 06/29/18 0326  .  ceFAZolin (ANCEF) IV Stopped (06/28/18 2252)   PRN Meds:.sodium chloride, acetaminophen **OR** acetaminophen, ondansetron **OR** ondansetron (ZOFRAN) IV, polyvinyl alcohol  Micro Results No results found for this or any previous visit (from the past 240 hour(s)).  Radiology Reports Dg Ankle Complete Left  Result Date: 06/25/2018 CLINICAL DATA:  84 y/o  F; fall with left ankle pain. EXAM: DG HIP (WITH OR WITHOUT PELVIS) 2-3V LEFT; LEFT ANKLE COMPLETE - 3+ VIEW; LEFT FOOT - COMPLETE 3+ VIEW; LEFT KNEE -  COMPLETE 4+ VIEW COMPARISON:  None. FINDINGS: Pelvis and left hip: Partially visualized right hemi plasty hardware with cerclage wires of the proximal femur. Hardware is intact. No periprosthetic lucency or fracture identified within the field of view. No pelvic fracture or diastasis. Mild osteoarthrosis of the left hip joint with acetabular fibrocystic degeneration and osteophytosis. Vascular calcifications noted. Left knee: No acute fracture, dislocation, or joint effusion. Mild loss of lateral femorotibial compartment joint space. Mild tricompartmental osteophytosis. Vascular calcifications noted. Left ankle: There is no evidence of hip fracture or dislocation. Ankle mortise is symmetric on these nonstress views. Talar dome is intact. Dorsal calcaneal enthesophytes. Vascular calcifications noted. Left foot: There is no evidence of hip fracture or dislocation. There is no evidence of arthropathy or other focal bone abnormality. IMPRESSION: 1. No acute fracture or dislocation identified. 2. Mild osteoarthrosis of the left hip and left knee. 3. Partially visualized right proximal femur hardware without apparent hardware related complication. Electronically Signed   By: Kristine Garbe M.D.   On: 06/25/2018 20:09   Ct Head Wo Contrast  Result Date: 06/28/2018 CLINICAL DATA:  Pain following fall EXAM: CT HEAD WITHOUT CONTRAST TECHNIQUE: Contiguous axial images were obtained from the base of the skull through the vertex without intravenous contrast. COMPARISON:  June 25, 2018 FINDINGS: Brain: Age related volume loss is stable. There is no intracranial mass, hemorrhage, extra-axial fluid collection, or midline shift. Small vessel disease throughout the centra semiovale bilaterally  appears stable. No acute appearing infarct is evident. Vascular: There is no hyperdense vessel. There is calcification in each carotid siphon region. Skull: There is a left frontal scalp hematoma. No fracture evident. The  bony calvarium appears intact. Sinuses/Orbits: There is a small retention cyst in the inferior posterior right maxillary antrum. There is mucosal thickening in several ethmoid air cells bilaterally. Patient is status post cataract removal on the left. Orbits otherwise appear symmetric bilaterally. Note that there is preseptal soft tissue swelling over the left orbit. Other: Mastoid air cells are clear. Torus palatinus noted incidentally, an anatomic variant. IMPRESSION: Stable age related volume loss with supratentorial small vessel disease. No acute infarct evident. No mass or hemorrhage. Left frontal scalp hematoma.  No fracture evident. Foci of arterial vascular calcification noted. Areas of paranasal sinus disease noted. Electronically Signed   By: Lowella Grip III M.D.   On: 06/28/2018 09:09   Ct Head Wo Contrast  Result Date: 06/25/2018 CLINICAL DATA:  Fall outside onto cement, striking the left side of the face. EXAM: CT HEAD WITHOUT CONTRAST CT MAXILLOFACIAL WITHOUT CONTRAST CT CERVICAL SPINE WITHOUT CONTRAST TECHNIQUE: Multidetector CT imaging of the head, cervical spine, and maxillofacial structures were performed using the standard protocol without intravenous contrast. Multiplanar CT image reconstructions of the cervical spine and maxillofacial structures were also generated. COMPARISON:  CT scans from 07/02/2014 and 03/29/2018 FINDINGS: CT HEAD FINDINGS Brain: The brainstem, cerebellum, cerebral peduncles, thalami, basal ganglia, basilar cisterns, and ventricular system appear within normal limits. Periventricular white matter and corona radiata hypodensities favor chronic ischemic microvascular white matter disease. No intracranial hemorrhage, mass lesion, or acute CVA. Vascular: There is atherosclerotic calcification of the cavernous carotid arteries bilaterally. Skull: Unremarkable Other: Torus palatinus noted. CT MAXILLOFACIAL FINDINGS Osseous: Bilateral mandibular tori. Degenerative  arthropathy of both temporomandibular joints, with diminutive size of the left mandibular condyle and flattening and spurring of the right mandibular condyle with loss of articular space. Several dental cavities. There are some irregularities of the mandible along the alveolar ridge on images 65 through 68 of series 9 anteriorly, but this appears roughly similar to the exam from 04/24/2008 hence I am skeptical of alveolar ridge fracture. No facial fracture is identified. Orbits: Unremarkable Sinuses: Small mucous retention. Cyst in the right maxillary sinus Soft tissues: Mild soft tissue swelling in the subcutaneous tissues anterior to the left maxilla. Bilateral common carotid atherosclerotic calcifications. CT CERVICAL SPINE FINDINGS Alignment: Mildly exaggerated cervical lordosis. Skull base and vertebrae: Loss of articular space and spurring at the anterior C1-2 articulation. No cervical spine fracture or acute bony findings. Soft tissues and spinal canal: Unremarkable Disc levels: Multilevel facet spurring without overt foraminal impingement identified. Upper chest: Biapical pleuroparenchymal scarring with associated calcifications. Peripheral interstitial accentuation in the lungs stable scarring anteriorly in the left upper lobe. Other: No supplemental non-categorized findings. IMPRESSION: 1. No acute intracranial findings, acute cervical spine findings, or acute facial fracture. 2. Periventricular white matter and corona radiata hypodensities favor chronic ischemic microvascular white matter disease. 3. Degenerative arthropathy of both temporomandibular joints. 4. Mild soft tissue swelling in the subcutaneous tissues anterior to the left maxilla. 5. Atherosclerosis. 6. Biapical pleuroparenchymal scarring with associated calcifications. Electronically Signed   By: Van Clines M.D.   On: 06/25/2018 20:00   Ct Cervical Spine Wo Contrast  Result Date: 06/25/2018 CLINICAL DATA:  Fall outside onto  cement, striking the left side of the face. EXAM: CT HEAD WITHOUT CONTRAST CT MAXILLOFACIAL WITHOUT CONTRAST CT CERVICAL SPINE WITHOUT CONTRAST  TECHNIQUE: Multidetector CT imaging of the head, cervical spine, and maxillofacial structures were performed using the standard protocol without intravenous contrast. Multiplanar CT image reconstructions of the cervical spine and maxillofacial structures were also generated. COMPARISON:  CT scans from 07/02/2014 and 03/29/2018 FINDINGS: CT HEAD FINDINGS Brain: The brainstem, cerebellum, cerebral peduncles, thalami, basal ganglia, basilar cisterns, and ventricular system appear within normal limits. Periventricular white matter and corona radiata hypodensities favor chronic ischemic microvascular white matter disease. No intracranial hemorrhage, mass lesion, or acute CVA. Vascular: There is atherosclerotic calcification of the cavernous carotid arteries bilaterally. Skull: Unremarkable Other: Torus palatinus noted. CT MAXILLOFACIAL FINDINGS Osseous: Bilateral mandibular tori. Degenerative arthropathy of both temporomandibular joints, with diminutive size of the left mandibular condyle and flattening and spurring of the right mandibular condyle with loss of articular space. Several dental cavities. There are some irregularities of the mandible along the alveolar ridge on images 65 through 68 of series 9 anteriorly, but this appears roughly similar to the exam from 04/24/2008 hence I am skeptical of alveolar ridge fracture. No facial fracture is identified. Orbits: Unremarkable Sinuses: Small mucous retention. Cyst in the right maxillary sinus Soft tissues: Mild soft tissue swelling in the subcutaneous tissues anterior to the left maxilla. Bilateral common carotid atherosclerotic calcifications. CT CERVICAL SPINE FINDINGS Alignment: Mildly exaggerated cervical lordosis. Skull base and vertebrae: Loss of articular space and spurring at the anterior C1-2 articulation. No cervical  spine fracture or acute bony findings. Soft tissues and spinal canal: Unremarkable Disc levels: Multilevel facet spurring without overt foraminal impingement identified. Upper chest: Biapical pleuroparenchymal scarring with associated calcifications. Peripheral interstitial accentuation in the lungs stable scarring anteriorly in the left upper lobe. Other: No supplemental non-categorized findings. IMPRESSION: 1. No acute intracranial findings, acute cervical spine findings, or acute facial fracture. 2. Periventricular white matter and corona radiata hypodensities favor chronic ischemic microvascular white matter disease. 3. Degenerative arthropathy of both temporomandibular joints. 4. Mild soft tissue swelling in the subcutaneous tissues anterior to the left maxilla. 5. Atherosclerosis. 6. Biapical pleuroparenchymal scarring with associated calcifications. Electronically Signed   By: Van Clines M.D.   On: 06/25/2018 20:00   US Venous Img Lower Unilateral Right  Result Date: 06/28/2018 CLINICAL DATA:  Right lower extremity pain and edema, recent fall EXAM: RIGHT LOWER EXTREMITY VENOUS DOPPLER ULTRASOUND TECHNIQUE: Gray-scale sonography with graded compression, as well as color Doppler and duplex ultrasound were performed to evaluate the lower extremity deep venous systems from the level of the common femoral vein and including the common femoral, femoral, profunda femoral, popliteal and calf veins including the posterior tibial, peroneal and gastrocnemius veins when visible. The superficial great saphenous vein was also interrogated. Spectral Doppler was utilized to evaluate flow at rest and with distal augmentation maneuvers in the common femoral, femoral and popliteal veins. COMPARISON:  None. FINDINGS: Contralateral Common Femoral Vein: Respiratory phasicity is normal and symmetric with the symptomatic side. No evidence of thrombus. Normal compressibility. Common Femoral Vein: No evidence of thrombus.  Normal compressibility, respiratory phasicity and response to augmentation. Saphenofemoral Junction: No evidence of thrombus. Normal compressibility and flow on color Doppler imaging. Profunda Femoral Vein: No evidence of thrombus. Normal compressibility and flow on color Doppler imaging. Femoral Vein: No evidence of thrombus. Normal compressibility, respiratory phasicity and response to augmentation. Popliteal Vein: No evidence of thrombus. Normal compressibility, respiratory phasicity and response to augmentation. Calf Veins: No evidence of thrombus. Normal compressibility and flow on color Doppler imaging. Superficial Great Saphenous Vein: No evidence of thrombus. Normal  compressibility. Venous Reflux:  None. Other Findings:  None. IMPRESSION: No evidence of deep venous thrombosis. Electronically Signed   By: Jerilynn Mages.  Shick M.D.   On: 06/28/2018 15:17   Dg Knee Complete 4 Views Left  Result Date: 06/25/2018 CLINICAL DATA:  84 y/o  F; fall with left ankle pain. EXAM: DG HIP (WITH OR WITHOUT PELVIS) 2-3V LEFT; LEFT ANKLE COMPLETE - 3+ VIEW; LEFT FOOT - COMPLETE 3+ VIEW; LEFT KNEE - COMPLETE 4+ VIEW COMPARISON:  None. FINDINGS: Pelvis and left hip: Partially visualized right hemi plasty hardware with cerclage wires of the proximal femur. Hardware is intact. No periprosthetic lucency or fracture identified within the field of view. No pelvic fracture or diastasis. Mild osteoarthrosis of the left hip joint with acetabular fibrocystic degeneration and osteophytosis. Vascular calcifications noted. Left knee: No acute fracture, dislocation, or joint effusion. Mild loss of lateral femorotibial compartment joint space. Mild tricompartmental osteophytosis. Vascular calcifications noted. Left ankle: There is no evidence of hip fracture or dislocation. Ankle mortise is symmetric on these nonstress views. Talar dome is intact. Dorsal calcaneal enthesophytes. Vascular calcifications noted. Left foot: There is no evidence of hip  fracture or dislocation. There is no evidence of arthropathy or other focal bone abnormality. IMPRESSION: 1. No acute fracture or dislocation identified. 2. Mild osteoarthrosis of the left hip and left knee. 3. Partially visualized right proximal femur hardware without apparent hardware related complication. Electronically Signed   By: Kristine Garbe M.D.   On: 06/25/2018 20:09   Dg Foot Complete Left  Result Date: 06/25/2018 CLINICAL DATA:  84 y/o  F; fall with left ankle pain. EXAM: DG HIP (WITH OR WITHOUT PELVIS) 2-3V LEFT; LEFT ANKLE COMPLETE - 3+ VIEW; LEFT FOOT - COMPLETE 3+ VIEW; LEFT KNEE - COMPLETE 4+ VIEW COMPARISON:  None. FINDINGS: Pelvis and left hip: Partially visualized right hemi plasty hardware with cerclage wires of the proximal femur. Hardware is intact. No periprosthetic lucency or fracture identified within the field of view. No pelvic fracture or diastasis. Mild osteoarthrosis of the left hip joint with acetabular fibrocystic degeneration and osteophytosis. Vascular calcifications noted. Left knee: No acute fracture, dislocation, or joint effusion. Mild loss of lateral femorotibial compartment joint space. Mild tricompartmental osteophytosis. Vascular calcifications noted. Left ankle: There is no evidence of hip fracture or dislocation. Ankle mortise is symmetric on these nonstress views. Talar dome is intact. Dorsal calcaneal enthesophytes. Vascular calcifications noted. Left foot: There is no evidence of hip fracture or dislocation. There is no evidence of arthropathy or other focal bone abnormality. IMPRESSION: 1. No acute fracture or dislocation identified. 2. Mild osteoarthrosis of the left hip and left knee. 3. Partially visualized right proximal femur hardware without apparent hardware related complication. Electronically Signed   By: Kristine Garbe M.D.   On: 06/25/2018 20:09   Dg Hip Unilat With Pelvis 2-3 Views Left  Result Date: 06/25/2018 CLINICAL DATA:   84 y/o  F; fall with left ankle pain. EXAM: DG HIP (WITH OR WITHOUT PELVIS) 2-3V LEFT; LEFT ANKLE COMPLETE - 3+ VIEW; LEFT FOOT - COMPLETE 3+ VIEW; LEFT KNEE - COMPLETE 4+ VIEW COMPARISON:  None. FINDINGS: Pelvis and left hip: Partially visualized right hemi plasty hardware with cerclage wires of the proximal femur. Hardware is intact. No periprosthetic lucency or fracture identified within the field of view. No pelvic fracture or diastasis. Mild osteoarthrosis of the left hip joint with acetabular fibrocystic degeneration and osteophytosis. Vascular calcifications noted. Left knee: No acute fracture, dislocation, or joint effusion. Mild loss  of lateral femorotibial compartment joint space. Mild tricompartmental osteophytosis. Vascular calcifications noted. Left ankle: There is no evidence of hip fracture or dislocation. Ankle mortise is symmetric on these nonstress views. Talar dome is intact. Dorsal calcaneal enthesophytes. Vascular calcifications noted. Left foot: There is no evidence of hip fracture or dislocation. There is no evidence of arthropathy or other focal bone abnormality. IMPRESSION: 1. No acute fracture or dislocation identified. 2. Mild osteoarthrosis of the left hip and left knee. 3. Partially visualized right proximal femur hardware without apparent hardware related complication. Electronically Signed   By: Kristine Garbe M.D.   On: 06/25/2018 20:09   Ct Maxillofacial Wo Cm  Result Date: 06/25/2018 CLINICAL DATA:  Fall outside onto cement, striking the left side of the face. EXAM: CT HEAD WITHOUT CONTRAST CT MAXILLOFACIAL WITHOUT CONTRAST CT CERVICAL SPINE WITHOUT CONTRAST TECHNIQUE: Multidetector CT imaging of the head, cervical spine, and maxillofacial structures were performed using the standard protocol without intravenous contrast. Multiplanar CT image reconstructions of the cervical spine and maxillofacial structures were also generated. COMPARISON:  CT scans from 07/02/2014  and 03/29/2018 FINDINGS: CT HEAD FINDINGS Brain: The brainstem, cerebellum, cerebral peduncles, thalami, basal ganglia, basilar cisterns, and ventricular system appear within normal limits. Periventricular white matter and corona radiata hypodensities favor chronic ischemic microvascular white matter disease. No intracranial hemorrhage, mass lesion, or acute CVA. Vascular: There is atherosclerotic calcification of the cavernous carotid arteries bilaterally. Skull: Unremarkable Other: Torus palatinus noted. CT MAXILLOFACIAL FINDINGS Osseous: Bilateral mandibular tori. Degenerative arthropathy of both temporomandibular joints, with diminutive size of the left mandibular condyle and flattening and spurring of the right mandibular condyle with loss of articular space. Several dental cavities. There are some irregularities of the mandible along the alveolar ridge on images 65 through 68 of series 9 anteriorly, but this appears roughly similar to the exam from 04/24/2008 hence I am skeptical of alveolar ridge fracture. No facial fracture is identified. Orbits: Unremarkable Sinuses: Small mucous retention. Cyst in the right maxillary sinus Soft tissues: Mild soft tissue swelling in the subcutaneous tissues anterior to the left maxilla. Bilateral common carotid atherosclerotic calcifications. CT CERVICAL SPINE FINDINGS Alignment: Mildly exaggerated cervical lordosis. Skull base and vertebrae: Loss of articular space and spurring at the anterior C1-2 articulation. No cervical spine fracture or acute bony findings. Soft tissues and spinal canal: Unremarkable Disc levels: Multilevel facet spurring without overt foraminal impingement identified. Upper chest: Biapical pleuroparenchymal scarring with associated calcifications. Peripheral interstitial accentuation in the lungs stable scarring anteriorly in the left upper lobe. Other: No supplemental non-categorized findings. IMPRESSION: 1. No acute intracranial findings, acute  cervical spine findings, or acute facial fracture. 2. Periventricular white matter and corona radiata hypodensities favor chronic ischemic microvascular white matter disease. 3. Degenerative arthropathy of both temporomandibular joints. 4. Mild soft tissue swelling in the subcutaneous tissues anterior to the left maxilla. 5. Atherosclerosis. 6. Biapical pleuroparenchymal scarring with associated calcifications. Electronically Signed   By: Van Clines M.D.   On: 06/25/2018 20:00    Time Spent in minutes  25   Benz Vandenberghe M.D on 06/29/2018 at 12:49 PM  Between 7am to 7pm - Pager - [phone]  After 7pm go to [email or web address] - password [facility]  [facility] -  Office  [phone]

## 2018-06-29 NOTE — Plan of Care (Signed)
  Problem: Acute Rehab PT Goals(only PT should resolve) Goal: Pt Will Go Supine/Side To Sit Outcome: Progressing Flowsheets (Taken 06/29/2018 1415) Pt will go Supine/Side to Sit: Independently Goal: Patient Will Transfer Sit To/From Stand Outcome: Progressing Flowsheets (Taken 06/29/2018 1415) Patient will transfer sit to/from stand: with modified independence Goal: Pt Will Transfer Bed To Chair/Chair To Bed Outcome: Progressing Flowsheets (Taken 06/29/2018 1415) Pt will Transfer Bed to Chair/Chair to Bed: with modified independence Goal: Pt Will Ambulate Outcome: Progressing Flowsheets (Taken 06/29/2018 1415) Pt will Ambulate: 50 feet; with cane; with rolling walker; with supervision   2:16 PM, 06/29/18 Lonell Grandchild, MPT Physical Therapist with [facility] [phone] office [phone] mobile phone

## 2018-06-30 DIAGNOSIS — K922 Gastrointestinal hemorrhage, unspecified: Secondary | ICD-10-CM

## 2018-06-30 DIAGNOSIS — L03119 Cellulitis of unspecified part of limb: Secondary | ICD-10-CM

## 2018-06-30 DIAGNOSIS — I878 Other specified disorders of veins: Secondary | ICD-10-CM

## 2018-06-30 LAB — CBC
HEMATOCRIT: 31.9 % — AB (ref 36.0–46.0)
Hemoglobin: 9.8 g/dL — ABNORMAL LOW (ref 12.0–15.0)
MCH: 31.3 pg (ref 26.0–34.0)
MCHC: 30.7 g/dL (ref 30.0–36.0)
MCV: 101.9 fL — ABNORMAL HIGH (ref 80.0–100.0)
NRBC: 0 % (ref 0.0–0.2)
PLATELETS: 165 10*3/uL (ref 150–400)
RBC: 3.13 MIL/uL — ABNORMAL LOW (ref 3.87–5.11)
RDW: 14.6 % (ref 11.5–15.5)
WBC: 6.2 10*3/uL (ref 4.0–10.5)

## 2018-06-30 MED ORDER — GABAPENTIN 300 MG PO CAPS
300.0000 mg | ORAL_CAPSULE | Freq: Every day | ORAL | Status: DC
  Administered 2018-06-30: 300 mg via ORAL
  Filled 2018-06-30: qty 1

## 2018-06-30 MED ORDER — IPRATROPIUM-ALBUTEROL 0.5-2.5 (3) MG/3ML IN SOLN
3.0000 mL | Freq: Three times a day (TID) | RESPIRATORY_TRACT | Status: DC
  Administered 2018-07-01 (×2): 3 mL via RESPIRATORY_TRACT
  Filled 2018-06-30 (×2): qty 3

## 2018-06-30 MED ORDER — CEPHALEXIN 500 MG PO CAPS
500.0000 mg | ORAL_CAPSULE | Freq: Two times a day (BID) | ORAL | Status: DC
  Administered 2018-06-30 – 2018-07-01 (×3): 500 mg via ORAL
  Filled 2018-06-30 (×3): qty 1

## 2018-06-30 MED ORDER — PANTOPRAZOLE SODIUM 40 MG IV SOLR
40.0000 mg | Freq: Two times a day (BID) | INTRAVENOUS | Status: DC
  Administered 2018-06-30 – 2018-07-01 (×3): 40 mg via INTRAVENOUS
  Filled 2018-06-30 (×3): qty 40

## 2018-06-30 NOTE — Progress Notes (Addendum)
PROGRESS NOTE                                                                                                                                                                                                             Patient Demographics:    [name], is a 84 y.o. female, DOB - 09-02-1926, ZOX:[id number]  Admit date - 06/28/2018   Admitting Physician Orson Eva, MD  Outpatient Primary MD for the patient is [name], MD  LOS - 1  Outpatient Specialists none  Chief Complaint  Patient presents with  . Fall       Brief Narrative  84 year old female with history of paroxysmal A. fib on Eliquis, diastolic CHF, COPD, coronary artery disease, pernicious anemia, chronic respiratory failure on 2 L home O2 and anxiety presented after mechanical fall.  She was getting up off her bedside commode when she tripped and fell onto her face.  Denies loss of consciousness.  She was seen in the ED 3 days prior to that after sustaining a mechanical fall at that time and again fell on her face she had a laceration in her left eyebrow that was repaired with Steri-Strips.  She did not have loss of consciousness at that time as well.  She reports that about a month back she had passed out for reasons unclear.  Both these 2 recent episodes were due to mechanical fall.  She also reports that she has limited vision in her right eye and macular degeneration in the left.  Uses walker and cane on ambulation. She denies any fevers, chills, headache, nausea, vomiting, abdominal pain, diarrhea, chest pain or shortness of breath. She was found to have increased right lower extremity edema and erythema.  In the ED vitals were stable.  Blood work was unremarkable.  Patient admitted for right lower leg cellulitis and frequent falls.  Treated with antibiotic for presumed cellulitis.  PT recommended SNF but patient declined. FOBT positive.  Hemoglobin  slightly downtrending.  Eliquis on hold pending GI evaluation.  Family elects to continue Eliquis after risk-benefit discussion.   Subjective:  No major events overnight.  Denies chest pain, dyspnea or palpitation. Denies hematochezia or melena.   Assessment  & Plan :  Right leg cellulitis/lymphedema status dermatitis:  venous Doppler of right extremity without DVT. -Switch Omnicef to Keflex to complete course  Mechanical fall/forehead and left facial laceration/hematoma: Patient with poor vision.  CT head without acute intracranial bleed.   -Eliquis on hold since admission.   -PT recommended SNF but patient will go home with home health PT and rolling walker.  -Has plan to provide close supervision per previous provider.  Paroxysmal A. Fib: Not in RVR.  Mali vas score 4.  Has bled score 1.  However, his bleeding risk is complicated by her risk of fall.  FOBT positive as well.  Hemoglobin downtrending.  Discussed this with patient's and patient's husband.  -Continue Cardizem -Continue holding Eliquis-may need clearance by GI before resuming. -Continue monitoring H&H  GI bleed: FOBT positive.  H&H downtrending.  Patient is on Eliquis. -GI consult attempted 11/28 but no answer -PPI every 12 hours -Monitor H&H  Chronic diastolic CHF: Currently with significant lower extremity edema but no cardiopulmonary symptoms. -Continue Lasix  Anxiety and depression -Continue Cymbalta   History of COPD/bronchiectasis -Continue PRN nebs and Symbicort.  No acute symptoms and stable on room air.  Hypothyroidism -Continue Synthroid  Hypokalemia -Replenished  Protein calorie malnutrition -Consult dietitian for assessment.  Code Status : Full code  Family Communication  : Husband at bedside  Disposition Plan  : Remains inpatient pending GI evaluation for GI bleed.  And is on Eliquis.  Final disposition Home with home health nurse and walker.  Barriers For Discharge : GI bleed  Consults  :  None  Procedures  : CT head  DVT Prophylaxis  : Eliquis (on hold)  Lab Results  Component Value Date   PLT 165 06/30/2018    Antibiotics  :    Anti-infectives (From admission, onward)   Start     Dose/Rate Route Frequency Ordered Stop   06/30/18 1130  cephALEXin (KEFLEX) capsule 500 mg     500 mg Oral Every 12 hours 06/30/18 1126     06/28/18 1400  ceFAZolin (ANCEF) IVPB 1 g/50 mL premix  Status:  Discontinued     1 g 100 mL/hr over 30 Minutes Intravenous Every 8 hours 06/28/18 1334 06/28/18 1342   06/28/18 1345  ceFAZolin (ANCEF) IVPB 1 g/50 mL premix  Status:  Discontinued     1 g 100 mL/hr over 30 Minutes Intravenous Every 12 hours 06/28/18 1342 06/30/18 1126   06/28/18 1200  vancomycin (VANCOCIN) IVPB 1000 mg/200 mL premix     1,000 mg 200 mL/hr over 60 Minutes Intravenous  Once 06/28/18 1158 06/28/18 1350        Objective:   Vitals:   06/30/18 0602 06/30/18 0729 06/30/18 1313 06/30/18 1326  BP: (!) 133/59   104/60  Pulse: 92   (!) 57  Resp: (!) 22   17  Temp: 98.7 F (37.1 C)   97.7 F (36.5 C)  TempSrc: Oral   Oral  SpO2: 97% 98% 99% 96%  Weight:      Height:        Wt Readings from Last 3 Encounters:  06/30/18 50.6 kg  06/25/18 49.9 kg  06/15/18 50 kg     Intake/Output Summary (Last 24 hours) at 06/30/2018 1411 Last data filed at 06/30/2018 1100 Gross per 24 hour  Intake 600 ml  Output 1675 ml  Net -1075 ml     Physical Exam GENERAL: Frail looking elderly female.  No acute distress. HEENT: MMM.  Hearing grossly intact.  Bruises over her face.  Hematoma over her right forehead. NECK: Supple.  No JVD.  LUNGS:  No IWOB.  Fair  air movement bilaterally. HEART:  RRR. Heart sounds normal.  ABD: Bowel sounds present. Soft. Non tender. EXT: 2+ pitting edema in right leg.  1+ pitting edema left leg. SKIN: Skin process in her face and arms.  Stasis dermatitis in lower extremities. NEURO: Awake, alert and oriented appropriately.  No gross  deficit. PSYCH: Calm. Normal affect.    Data Review:    CBC Recent Labs  Lab 06/28/18 0905 06/29/18 0514 06/30/18 0422  WBC 7.0 6.3 6.2  HGB 11.6* 10.8* 9.8*  HCT 38.2 34.8* 31.9*  PLT 179 167 165  MCV 101.1* 101.2* 101.9*  MCH 30.7 31.4 31.3  MCHC 30.4 31.0 30.7  RDW 14.6 14.6 14.6  LYMPHSABS 0.9  --   --   MONOABS 0.6  --   --   EOSABS 0.1  --   --   BASOSABS 0.0  --   --     Chemistries  Recent Labs  Lab 06/28/18 0905 06/29/18 0514  NA 140 141  K 3.5 3.2*  CL 96* 101  CO2 35* 32  GLUCOSE 93 91  BUN 19 14  CREATININE 0.98 0.83  CALCIUM 9.2 8.3*   ------------------------------------------------------------------------------------------------------------------ No results for input(s): CHOL, HDL, LDLCALC, TRIG, CHOLHDL, LDLDIRECT in the last 72 hours.  No results found for: HGBA1C ------------------------------------------------------------------------------------------------------------------ No results for input(s): TSH, T4TOTAL, T3FREE, THYROIDAB in the last 72 hours.  Invalid input(s): FREET3 ------------------------------------------------------------------------------------------------------------------ No results for input(s): VITAMINB12, FOLATE, FERRITIN, TIBC, IRON, RETICCTPCT in the last 72 hours.  Coagulation profile No results for input(s): INR, PROTIME in the last 168 hours.  No results for input(s): DDIMER in the last 72 hours.  Cardiac Enzymes No results for input(s): CKMB, TROPONINI, MYOGLOBIN in the last 168 hours.  Invalid input(s): CK ------------------------------------------------------------------------------------------------------------------    Component Value Date/Time   BNP 604.9 (H) 03/29/2018 1612    Inpatient Medications  Scheduled Meds: . cephALEXin  500 mg Oral Q12H  . cholecalciferol  1,000 Units Oral Daily  . clorazepate  7.5 mg Oral QHS  . diltiazem  360 mg Oral Daily  . DULoxetine  30 mg Oral QHS  . folic  acid  3,016 mcg Oral Daily  . furosemide  40 mg Oral Daily  . ipratropium-albuterol  3 mL Nebulization Q8H  . levothyroxine  75 mcg Oral Daily  . mometasone-formoterol  2 puff Inhalation BID  . multivitamin-lutein   Oral BID  . vitamin B-12  1,000 mcg Oral Daily   Continuous Infusions: . sodium chloride 10 mL/hr at 06/29/18 0326   PRN Meds:.sodium chloride, acetaminophen **OR** acetaminophen, ondansetron **OR** ondansetron (ZOFRAN) IV, polyvinyl alcohol  Micro Results No results found for this or any previous visit (from the past 240 hour(s)).  Radiology Reports Dg Ankle Complete Left  Result Date: 06/25/2018 CLINICAL DATA:  84 y/o  F; fall with left ankle pain. EXAM: DG HIP (WITH OR WITHOUT PELVIS) 2-3V LEFT; LEFT ANKLE COMPLETE - 3+ VIEW; LEFT FOOT - COMPLETE 3+ VIEW; LEFT KNEE - COMPLETE 4+ VIEW COMPARISON:  None. FINDINGS: Pelvis and left hip: Partially visualized right hemi plasty hardware with cerclage wires of the proximal femur. Hardware is intact. No periprosthetic lucency or fracture identified within the field of view. No pelvic fracture or diastasis. Mild osteoarthrosis of the left hip joint with acetabular fibrocystic degeneration and osteophytosis. Vascular calcifications noted. Left knee: No acute fracture, dislocation, or joint effusion. Mild loss of lateral femorotibial compartment joint space. Mild tricompartmental osteophytosis. Vascular calcifications noted. Left ankle: There is no evidence of  hip fracture or dislocation. Ankle mortise is symmetric on these nonstress views. Talar dome is intact. Dorsal calcaneal enthesophytes. Vascular calcifications noted. Left foot: There is no evidence of hip fracture or dislocation. There is no evidence of arthropathy or other focal bone abnormality. IMPRESSION: 1. No acute fracture or dislocation identified. 2. Mild osteoarthrosis of the left hip and left knee. 3. Partially visualized right proximal femur hardware without apparent  hardware related complication. Electronically Signed   By: Kristine Garbe M.D.   On: 06/25/2018 20:09   Ct Head Wo Contrast  Result Date: 06/28/2018 CLINICAL DATA:  Pain following fall EXAM: CT HEAD WITHOUT CONTRAST TECHNIQUE: Contiguous axial images were obtained from the base of the skull through the vertex without intravenous contrast. COMPARISON:  June 25, 2018 FINDINGS: Brain: Age related volume loss is stable. There is no intracranial mass, hemorrhage, extra-axial fluid collection, or midline shift. Small vessel disease throughout the centra semiovale bilaterally appears stable. No acute appearing infarct is evident. Vascular: There is no hyperdense vessel. There is calcification in each carotid siphon region. Skull: There is a left frontal scalp hematoma. No fracture evident. The bony calvarium appears intact. Sinuses/Orbits: There is a small retention cyst in the inferior posterior right maxillary antrum. There is mucosal thickening in several ethmoid air cells bilaterally. Patient is status post cataract removal on the left. Orbits otherwise appear symmetric bilaterally. Note that there is preseptal soft tissue swelling over the left orbit. Other: Mastoid air cells are clear. Torus palatinus noted incidentally, an anatomic variant. IMPRESSION: Stable age related volume loss with supratentorial small vessel disease. No acute infarct evident. No mass or hemorrhage. Left frontal scalp hematoma.  No fracture evident. Foci of arterial vascular calcification noted. Areas of paranasal sinus disease noted. Electronically Signed   By: Lowella Grip III M.D.   On: 06/28/2018 09:09   Ct Head Wo Contrast  Result Date: 06/25/2018 CLINICAL DATA:  Fall outside onto cement, striking the left side of the face. EXAM: CT HEAD WITHOUT CONTRAST CT MAXILLOFACIAL WITHOUT CONTRAST CT CERVICAL SPINE WITHOUT CONTRAST TECHNIQUE: Multidetector CT imaging of the head, cervical spine, and maxillofacial  structures were performed using the standard protocol without intravenous contrast. Multiplanar CT image reconstructions of the cervical spine and maxillofacial structures were also generated. COMPARISON:  CT scans from 07/02/2014 and 03/29/2018 FINDINGS: CT HEAD FINDINGS Brain: The brainstem, cerebellum, cerebral peduncles, thalami, basal ganglia, basilar cisterns, and ventricular system appear within normal limits. Periventricular white matter and corona radiata hypodensities favor chronic ischemic microvascular white matter disease. No intracranial hemorrhage, mass lesion, or acute CVA. Vascular: There is atherosclerotic calcification of the cavernous carotid arteries bilaterally. Skull: Unremarkable Other: Torus palatinus noted. CT MAXILLOFACIAL FINDINGS Osseous: Bilateral mandibular tori. Degenerative arthropathy of both temporomandibular joints, with diminutive size of the left mandibular condyle and flattening and spurring of the right mandibular condyle with loss of articular space. Several dental cavities. There are some irregularities of the mandible along the alveolar ridge on images 65 through 68 of series 9 anteriorly, but this appears roughly similar to the exam from 04/24/2008 hence I am skeptical of alveolar ridge fracture. No facial fracture is identified. Orbits: Unremarkable Sinuses: Small mucous retention. Cyst in the right maxillary sinus Soft tissues: Mild soft tissue swelling in the subcutaneous tissues anterior to the left maxilla. Bilateral common carotid atherosclerotic calcifications. CT CERVICAL SPINE FINDINGS Alignment: Mildly exaggerated cervical lordosis. Skull base and vertebrae: Loss of articular space and spurring at the anterior C1-2 articulation. No cervical spine fracture  or acute bony findings. Soft tissues and spinal canal: Unremarkable Disc levels: Multilevel facet spurring without overt foraminal impingement identified. Upper chest: Biapical pleuroparenchymal scarring with  associated calcifications. Peripheral interstitial accentuation in the lungs stable scarring anteriorly in the left upper lobe. Other: No supplemental non-categorized findings. IMPRESSION: 1. No acute intracranial findings, acute cervical spine findings, or acute facial fracture. 2. Periventricular white matter and corona radiata hypodensities favor chronic ischemic microvascular white matter disease. 3. Degenerative arthropathy of both temporomandibular joints. 4. Mild soft tissue swelling in the subcutaneous tissues anterior to the left maxilla. 5. Atherosclerosis. 6. Biapical pleuroparenchymal scarring with associated calcifications. Electronically Signed   By: Van Clines M.D.   On: 06/25/2018 20:00   Ct Cervical Spine Wo Contrast  Result Date: 06/25/2018 CLINICAL DATA:  Fall outside onto cement, striking the left side of the face. EXAM: CT HEAD WITHOUT CONTRAST CT MAXILLOFACIAL WITHOUT CONTRAST CT CERVICAL SPINE WITHOUT CONTRAST TECHNIQUE: Multidetector CT imaging of the head, cervical spine, and maxillofacial structures were performed using the standard protocol without intravenous contrast. Multiplanar CT image reconstructions of the cervical spine and maxillofacial structures were also generated. COMPARISON:  CT scans from 07/02/2014 and 03/29/2018 FINDINGS: CT HEAD FINDINGS Brain: The brainstem, cerebellum, cerebral peduncles, thalami, basal ganglia, basilar cisterns, and ventricular system appear within normal limits. Periventricular white matter and corona radiata hypodensities favor chronic ischemic microvascular white matter disease. No intracranial hemorrhage, mass lesion, or acute CVA. Vascular: There is atherosclerotic calcification of the cavernous carotid arteries bilaterally. Skull: Unremarkable Other: Torus palatinus noted. CT MAXILLOFACIAL FINDINGS Osseous: Bilateral mandibular tori. Degenerative arthropathy of both temporomandibular joints, with diminutive size of the left  mandibular condyle and flattening and spurring of the right mandibular condyle with loss of articular space. Several dental cavities. There are some irregularities of the mandible along the alveolar ridge on images 65 through 68 of series 9 anteriorly, but this appears roughly similar to the exam from 04/24/2008 hence I am skeptical of alveolar ridge fracture. No facial fracture is identified. Orbits: Unremarkable Sinuses: Small mucous retention. Cyst in the right maxillary sinus Soft tissues: Mild soft tissue swelling in the subcutaneous tissues anterior to the left maxilla. Bilateral common carotid atherosclerotic calcifications. CT CERVICAL SPINE FINDINGS Alignment: Mildly exaggerated cervical lordosis. Skull base and vertebrae: Loss of articular space and spurring at the anterior C1-2 articulation. No cervical spine fracture or acute bony findings. Soft tissues and spinal canal: Unremarkable Disc levels: Multilevel facet spurring without overt foraminal impingement identified. Upper chest: Biapical pleuroparenchymal scarring with associated calcifications. Peripheral interstitial accentuation in the lungs stable scarring anteriorly in the left upper lobe. Other: No supplemental non-categorized findings. IMPRESSION: 1. No acute intracranial findings, acute cervical spine findings, or acute facial fracture. 2. Periventricular white matter and corona radiata hypodensities favor chronic ischemic microvascular white matter disease. 3. Degenerative arthropathy of both temporomandibular joints. 4. Mild soft tissue swelling in the subcutaneous tissues anterior to the left maxilla. 5. Atherosclerosis. 6. Biapical pleuroparenchymal scarring with associated calcifications. Electronically Signed   By: Van Clines M.D.   On: 06/25/2018 20:00   US Venous Img Lower Unilateral Right  Result Date: 06/28/2018 CLINICAL DATA:  Right lower extremity pain and edema, recent fall EXAM: RIGHT LOWER EXTREMITY VENOUS DOPPLER  ULTRASOUND TECHNIQUE: Gray-scale sonography with graded compression, as well as color Doppler and duplex ultrasound were performed to evaluate the lower extremity deep venous systems from the level of the common femoral vein and including the common femoral, femoral, profunda femoral, popliteal and calf  veins including the posterior tibial, peroneal and gastrocnemius veins when visible. The superficial great saphenous vein was also interrogated. Spectral Doppler was utilized to evaluate flow at rest and with distal augmentation maneuvers in the common femoral, femoral and popliteal veins. COMPARISON:  None. FINDINGS: Contralateral Common Femoral Vein: Respiratory phasicity is normal and symmetric with the symptomatic side. No evidence of thrombus. Normal compressibility. Common Femoral Vein: No evidence of thrombus. Normal compressibility, respiratory phasicity and response to augmentation. Saphenofemoral Junction: No evidence of thrombus. Normal compressibility and flow on color Doppler imaging. Profunda Femoral Vein: No evidence of thrombus. Normal compressibility and flow on color Doppler imaging. Femoral Vein: No evidence of thrombus. Normal compressibility, respiratory phasicity and response to augmentation. Popliteal Vein: No evidence of thrombus. Normal compressibility, respiratory phasicity and response to augmentation. Calf Veins: No evidence of thrombus. Normal compressibility and flow on color Doppler imaging. Superficial Great Saphenous Vein: No evidence of thrombus. Normal compressibility. Venous Reflux:  None. Other Findings:  None. IMPRESSION: No evidence of deep venous thrombosis. Electronically Signed   By: Jerilynn Mages.  Shick M.D.   On: 06/28/2018 15:17   Dg Knee Complete 4 Views Left  Result Date: 06/25/2018 CLINICAL DATA:  84 y/o  F; fall with left ankle pain. EXAM: DG HIP (WITH OR WITHOUT PELVIS) 2-3V LEFT; LEFT ANKLE COMPLETE - 3+ VIEW; LEFT FOOT - COMPLETE 3+ VIEW; LEFT KNEE - COMPLETE 4+ VIEW  COMPARISON:  None. FINDINGS: Pelvis and left hip: Partially visualized right hemi plasty hardware with cerclage wires of the proximal femur. Hardware is intact. No periprosthetic lucency or fracture identified within the field of view. No pelvic fracture or diastasis. Mild osteoarthrosis of the left hip joint with acetabular fibrocystic degeneration and osteophytosis. Vascular calcifications noted. Left knee: No acute fracture, dislocation, or joint effusion. Mild loss of lateral femorotibial compartment joint space. Mild tricompartmental osteophytosis. Vascular calcifications noted. Left ankle: There is no evidence of hip fracture or dislocation. Ankle mortise is symmetric on these nonstress views. Talar dome is intact. Dorsal calcaneal enthesophytes. Vascular calcifications noted. Left foot: There is no evidence of hip fracture or dislocation. There is no evidence of arthropathy or other focal bone abnormality. IMPRESSION: 1. No acute fracture or dislocation identified. 2. Mild osteoarthrosis of the left hip and left knee. 3. Partially visualized right proximal femur hardware without apparent hardware related complication. Electronically Signed   By: Kristine Garbe M.D.   On: 06/25/2018 20:09   Dg Foot Complete Left  Result Date: 06/25/2018 CLINICAL DATA:  84 y/o  F; fall with left ankle pain. EXAM: DG HIP (WITH OR WITHOUT PELVIS) 2-3V LEFT; LEFT ANKLE COMPLETE - 3+ VIEW; LEFT FOOT - COMPLETE 3+ VIEW; LEFT KNEE - COMPLETE 4+ VIEW COMPARISON:  None. FINDINGS: Pelvis and left hip: Partially visualized right hemi plasty hardware with cerclage wires of the proximal femur. Hardware is intact. No periprosthetic lucency or fracture identified within the field of view. No pelvic fracture or diastasis. Mild osteoarthrosis of the left hip joint with acetabular fibrocystic degeneration and osteophytosis. Vascular calcifications noted. Left knee: No acute fracture, dislocation, or joint effusion. Mild loss of  lateral femorotibial compartment joint space. Mild tricompartmental osteophytosis. Vascular calcifications noted. Left ankle: There is no evidence of hip fracture or dislocation. Ankle mortise is symmetric on these nonstress views. Talar dome is intact. Dorsal calcaneal enthesophytes. Vascular calcifications noted. Left foot: There is no evidence of hip fracture or dislocation. There is no evidence of arthropathy or other focal bone abnormality. IMPRESSION: 1. No acute  fracture or dislocation identified. 2. Mild osteoarthrosis of the left hip and left knee. 3. Partially visualized right proximal femur hardware without apparent hardware related complication. Electronically Signed   By: Kristine Garbe M.D.   On: 06/25/2018 20:09   Dg Hip Unilat With Pelvis 2-3 Views Left  Result Date: 06/25/2018 CLINICAL DATA:  84 y/o  F; fall with left ankle pain. EXAM: DG HIP (WITH OR WITHOUT PELVIS) 2-3V LEFT; LEFT ANKLE COMPLETE - 3+ VIEW; LEFT FOOT - COMPLETE 3+ VIEW; LEFT KNEE - COMPLETE 4+ VIEW COMPARISON:  None. FINDINGS: Pelvis and left hip: Partially visualized right hemi plasty hardware with cerclage wires of the proximal femur. Hardware is intact. No periprosthetic lucency or fracture identified within the field of view. No pelvic fracture or diastasis. Mild osteoarthrosis of the left hip joint with acetabular fibrocystic degeneration and osteophytosis. Vascular calcifications noted. Left knee: No acute fracture, dislocation, or joint effusion. Mild loss of lateral femorotibial compartment joint space. Mild tricompartmental osteophytosis. Vascular calcifications noted. Left ankle: There is no evidence of hip fracture or dislocation. Ankle mortise is symmetric on these nonstress views. Talar dome is intact. Dorsal calcaneal enthesophytes. Vascular calcifications noted. Left foot: There is no evidence of hip fracture or dislocation. There is no evidence of arthropathy or other focal bone abnormality.  IMPRESSION: 1. No acute fracture or dislocation identified. 2. Mild osteoarthrosis of the left hip and left knee. 3. Partially visualized right proximal femur hardware without apparent hardware related complication. Electronically Signed   By: Kristine Garbe M.D.   On: 06/25/2018 20:09   Ct Maxillofacial Wo Cm  Result Date: 06/25/2018 CLINICAL DATA:  Fall outside onto cement, striking the left side of the face. EXAM: CT HEAD WITHOUT CONTRAST CT MAXILLOFACIAL WITHOUT CONTRAST CT CERVICAL SPINE WITHOUT CONTRAST TECHNIQUE: Multidetector CT imaging of the head, cervical spine, and maxillofacial structures were performed using the standard protocol without intravenous contrast. Multiplanar CT image reconstructions of the cervical spine and maxillofacial structures were also generated. COMPARISON:  CT scans from 07/02/2014 and 03/29/2018 FINDINGS: CT HEAD FINDINGS Brain: The brainstem, cerebellum, cerebral peduncles, thalami, basal ganglia, basilar cisterns, and ventricular system appear within normal limits. Periventricular white matter and corona radiata hypodensities favor chronic ischemic microvascular white matter disease. No intracranial hemorrhage, mass lesion, or acute CVA. Vascular: There is atherosclerotic calcification of the cavernous carotid arteries bilaterally. Skull: Unremarkable Other: Torus palatinus noted. CT MAXILLOFACIAL FINDINGS Osseous: Bilateral mandibular tori. Degenerative arthropathy of both temporomandibular joints, with diminutive size of the left mandibular condyle and flattening and spurring of the right mandibular condyle with loss of articular space. Several dental cavities. There are some irregularities of the mandible along the alveolar ridge on images 65 through 68 of series 9 anteriorly, but this appears roughly similar to the exam from 04/24/2008 hence I am skeptical of alveolar ridge fracture. No facial fracture is identified. Orbits: Unremarkable Sinuses: Small mucous  retention. Cyst in the right maxillary sinus Soft tissues: Mild soft tissue swelling in the subcutaneous tissues anterior to the left maxilla. Bilateral common carotid atherosclerotic calcifications. CT CERVICAL SPINE FINDINGS Alignment: Mildly exaggerated cervical lordosis. Skull base and vertebrae: Loss of articular space and spurring at the anterior C1-2 articulation. No cervical spine fracture or acute bony findings. Soft tissues and spinal canal: Unremarkable Disc levels: Multilevel facet spurring without overt foraminal impingement identified. Upper chest: Biapical pleuroparenchymal scarring with associated calcifications. Peripheral interstitial accentuation in the lungs stable scarring anteriorly in the left upper lobe. Other: No supplemental non-categorized findings. IMPRESSION:  1. No acute intracranial findings, acute cervical spine findings, or acute facial fracture. 2. Periventricular white matter and corona radiata hypodensities favor chronic ischemic microvascular white matter disease. 3. Degenerative arthropathy of both temporomandibular joints. 4. Mild soft tissue swelling in the subcutaneous tissues anterior to the left maxilla. 5. Atherosclerosis. 6. Biapical pleuroparenchymal scarring with associated calcifications. Electronically Signed   By: Van Clines M.D.   On: 06/25/2018 20:00    Time Spent in minutes  35  Yoselyn Mcglade T. [facility] Pager [phone]  If 7PM-7AM, please contact night-coverage [email or web address] Password [facility] 06/30/2018, 2:24 PM

## 2018-07-01 DIAGNOSIS — I872 Venous insufficiency (chronic) (peripheral): Secondary | ICD-10-CM

## 2018-07-01 LAB — BASIC METABOLIC PANEL
Anion gap: 8 (ref 5–15)
BUN: 17 mg/dL (ref 8–23)
CALCIUM: 8.7 mg/dL — AB (ref 8.9–10.3)
CO2: 31 mmol/L (ref 22–32)
CREATININE: 0.91 mg/dL (ref 0.44–1.00)
Chloride: 101 mmol/L (ref 98–111)
GFR calc Af Amer: >60 mL/min (ref >60)
GFR, EST NON AFRICAN AMERICAN: 56 mL/min — AB (ref >60)
GLUCOSE: 97 mg/dL (ref 70–99)
Potassium: 4.9 mmol/L (ref 3.5–5.1)
Sodium: 140 mmol/L (ref 135–145)

## 2018-07-01 LAB — CBC
HCT: 35.8 % — ABNORMAL LOW (ref 36.0–46.0)
HEMOGLOBIN: 10.9 g/dL — AB (ref 12.0–15.0)
MCH: 31.6 pg (ref 26.0–34.0)
MCHC: 30.4 g/dL (ref 30.0–36.0)
MCV: 103.8 fL — ABNORMAL HIGH (ref 80.0–100.0)
Platelets: 185 10*3/uL (ref 150–400)
RBC: 3.45 MIL/uL — ABNORMAL LOW (ref 3.87–5.11)
RDW: 14.6 % (ref 11.5–15.5)
WBC: 6.2 10*3/uL (ref 4.0–10.5)
nRBC: 0 % (ref 0.0–0.2)

## 2018-07-01 MED ORDER — ENSURE ENLIVE PO LIQD: 237.0000 mL | Freq: Three times a day (TID) | ORAL | 0 refills | Status: AC

## 2018-07-01 MED ORDER — DULOXETINE HCL 30 MG PO CPEP: 30.0000 mg | ORAL_CAPSULE | Freq: Every day | ORAL | 0 refills | Status: DC

## 2018-07-01 MED ORDER — POLYETHYLENE GLYCOL 3350 17 G PO PACK: 17.0000 g | PACK | Freq: Two times a day (BID) | ORAL | 0 refills | Status: DC

## 2018-07-01 MED ORDER — ENSURE ENLIVE PO LIQD: 237.0000 mL | Freq: Two times a day (BID) | ORAL | Status: DC

## 2018-07-01 MED ORDER — ENSURE ENLIVE PO LIQD
237.0000 mL | Freq: Three times a day (TID) | ORAL | Status: DC
  Administered 2018-07-01: 237 mL via ORAL

## 2018-07-01 MED ORDER — PANTOPRAZOLE SODIUM 40 MG IV SOLR: 40.0000 mg | Freq: Two times a day (BID) | INTRAVENOUS | 0 refills | Status: DC

## 2018-07-01 MED ORDER — POLYETHYLENE GLYCOL 3350 17 G PO PACK
17.0000 g | PACK | Freq: Two times a day (BID) | ORAL | Status: DC
  Administered 2018-07-01: 17 g via ORAL
  Filled 2018-07-01: qty 1

## 2018-07-01 MED ORDER — CEPHALEXIN 500 MG PO CAPS: 500.0000 mg | ORAL_CAPSULE | Freq: Two times a day (BID) | ORAL | 0 refills | Status: DC

## 2018-07-01 NOTE — Discharge Summary (Signed)
Physician Discharge Summary  [name] XAJ:[id number] DOB: October 25, 1926 DOA: 06/28/2018  PCP: [name], MD  Admit date: 06/28/2018 Discharge date: 07/01/2018  Admitted From: Home Disposition: Home  Recommendations for Outpatient Follow-up:  1. Follow up with PCP in 1-2 weeks 2. Please obtain BMP/CBC in one week 3. Please follow up on the following pending results:  Home Health: Physical therapy and nurse Equipment/Devices: Patient has a walker at home  Discharge Condition: Stable CODE STATUS: Full code Diet recommendation: Regular diet with supplementation  HPI: Per Dr. Carles Collet [name] is a 84 y.o. female with medical history of paroxysmal atrial fibrillation, dCHF, COPD, coronary disease, pernicious anemia, chronic respiratory failure on 2 L at nighttime, anxiety presenting after mechanical fall on the morning of 06/28/2018.  Patient was getting up off the bedside commode when she tripped and fell onto her face.  She did not lose consciousness.  The patient was in the emergency department on 06/25/2018 after sustaining a mechanical fall at that time, falling onto her face.  Repaired at that time as well as a laceration above her left eyebrow.  Steri-Strips with her skin was too thin for sutures.  The patient did not lose consciousness during the fall.  The patient denied any other falls in the past month.  The patient denied any fevers, chills, chest pain, shortness breath, aura, nausea, vomiting, diarrhea, abdominal pain.  She complains of some generalized weakness since starting on furosemide when she was discharged from the hospital on 04/06/2018. Nevertheless, the patient has noted increasing edema of her right lower extremity greater than left lower extremity for about the last week even prior to the falls.  In addition, she has noted some erythema, but did not think much of it.  Finally, when she is asked, she has noted some increasing pain in her right lower  extremity for the past week.  She denies any fevers, chills.  In the emergency department, the patient was afebrile hemodynamically stable saturating 93% on room air.  Heart rate was in the low 100s.  BMP was unremarkable.  CBC showed WBC 7.0 with hemoglobin 11.6.  Because of erythema right lower extremity there was concern for cellulitis.  The patient was given a dose of vancomycin.  Hospital Course: Patient was started on Ancef for presumed right leg cellulitis and transition to Keflex the day prior to discharge.  Venous Doppler negative for DVT.  Evaluated by physical therapy who recommended SNF placement.  Patient declined SNF placement and chose to go home with home health.  Accordingly, she was discharged with home health PT and nurse.  Patient was discharged on Keflex for 7 more days.  Patient's Eliquis was held on admission due to left forehead hematoma and also positive FOBT.  Evaluated by gastroenterology who did feel there is a need for colonoscopy or endoscopy as hemoglobin is relatively stable.  Suggested holding Eliquis for 3 to 4 days.   See individual problem list below for more.  Discharge Diagnoses:  Active Problems:   Bronchiectasis without acute exacerbation (HCC)   Cellulitis, leg   Chronic diastolic CHF (congestive heart failure) (HCC)   AF (paroxysmal atrial fibrillation) (HCC)  Right leg cellulitis/lymphedema status dermatitis:  venous Doppler of right extremity without DVT. -Discharged on Keflex for 7 more days.  Mechanical fall/forehead and left facial laceration/hematoma: Patient with poor vision.  CT head without acute intracranial bleed.   -Eliquis held on discharge.  Patient was advised to resume on 07/04/2018. -PT recommended  SNF but patient will go home with home health PT/RN and rolling walker.  -Husband to provide close supervision per previous provider.  Paroxysmal A. Fib: Not in RVR.  Mali vas score 4.  Has bled score 1.  However, his bleeding risk is  complicated by her risk of fall.  FOBT positive as well.  Hemoglobin downtrending.  Discussed this with patient's and patient's husband.  -Continue Cardizem -Advised to resume Eliquis on 07/04/2018. -Recommend CBC and evaluation for GI bleed at follow-up.  GI bleed: FOBT positive.  H&H downtrending.  Patient is on Eliquis. -GI consult attempted 11/28 but no answer -PPI every 12 hours -Monitor H&H  Chronic diastolic CHF: Currently with significant lower extremity edema but no cardiopulmonary symptoms. -Continue Lasix  Anxiety and depression -Continue Cymbalta   History of COPD/bronchiectasis -Continue PRN nebs and Symbicort.  No acute symptoms and stable on room air.  Hypothyroidism -Continue Synthroid  Hypokalemia -Replenished  Severe protein calorie malnutrition -Discharged on home initial.   Discharge Instructions  Discharge Instructions    Diet - low sodium heart healthy   Complete by:  As directed    Increase activity slowly   Complete by:  As directed      Allergies as of 07/01/2018      Reactions   Shrimp [shellfish Allergy] Nausea And Vomiting, Swelling, Other (See Comments)   Made her run a fever also   Fish Allergy Nausea And Vomiting, Swelling, Other (See Comments)   Extremely sick   Adhesive [tape] Other (See Comments)   Tape BRUISES and TEARS THE SKIN; please use an alternative!!   Other Other (See Comments)   Does not remember which "mycin" drug = yeast infection No seeds or nuts = Digestive isues      Medication List    TAKE these medications   acetaminophen 500 MG tablet Commonly known as:  TYLENOL Take 2 tablets (1,000 mg total) by mouth every 8 (eight) hours. What changed:    when to take this  reasons to take this   albuterol 108 (90 Base) MCG/ACT inhaler Commonly known as:  PROVENTIL HFA;VENTOLIN HFA Inhale 2 puffs into the lungs every 6 (six) hours as needed. What changed:  reasons to take this   bisacodyl 10 MG  suppository Commonly known as:  DULCOLAX Place 1 suppository (10 mg total) rectally daily as needed for moderate constipation.   budesonide-formoterol 80-4.5 MCG/ACT inhaler Commonly known as:  SYMBICORT Inhale 2 puffs into the lungs 2 (two) times daily. What changed:    when to take this  reasons to take this   cephALEXin 500 MG capsule Commonly known as:  KEFLEX Take 1 capsule (500 mg total) by mouth every 12 (twelve) hours.   clorazepate 7.5 MG tablet Commonly known as:  TRANXENE Take 7.5 mg by mouth at bedtime.   diltiazem 360 MG 24 hr capsule Commonly known as:  CARDIZEM CD Take 1 capsule (360 mg total) by mouth daily.   DULoxetine 30 MG capsule Commonly known as:  CYMBALTA Take 1 capsule (30 mg total) by mouth at bedtime.   ELIQUIS 2.5 MG Tabs tablet Generic drug:  apixaban TAKE 1 TABLET BY MOUTH TWICE DAILY What changed:  how much to take   feeding supplement (ENSURE ENLIVE) Liqd Take 237 mLs by mouth 3 (three) times daily between meals.   folic acid 329 MCG tablet Commonly known as:  FOLVITE Take 400 mcg by mouth daily.   furosemide 20 MG tablet Commonly known as:  LASIX Take  2 tablets (40 mg total) by mouth daily.   gabapentin 100 MG capsule Commonly known as:  NEURONTIN Take 300 mg by mouth at bedtime.   hydrocortisone 25 MG suppository Commonly known as:  ANUSOL-HC Place 25 mg rectally as needed for hemorrhoids.   ICAPS AREDS 2 Caps Take 1 capsule by mouth 2 (two) times daily.   levothyroxine 75 MCG tablet Commonly known as:  SYNTHROID, LEVOTHROID Take 75 mcg by mouth daily.   magnesium hydroxide 400 MG/5ML suspension Commonly known as:  MILK OF MAGNESIA Take 5-15 mLs by mouth daily as needed for mild constipation.   nitroGLYCERIN 0.4 MG SL tablet Commonly known as:  NITROSTAT Place 0.4 mg under the tongue every 5 (five) minutes as needed for chest pain.   ondansetron 4 MG tablet Commonly known as:  ZOFRAN TAKE 1 TABLET BY MOUTH EVERY 8  HOURS AS NEEDED FOR NAUSEA OR VOMITING What changed:  See the new instructions.   OXYGEN Inhale 2 L into the lungs at bedtime. As instructed   pantoprazole 40 MG injection Commonly known as:  PROTONIX Inject 40 mg into the vein every 12 (twelve) hours.   polyethylene glycol packet Commonly known as:  MIRALAX / GLYCOLAX Take 17 g by mouth 2 (two) times daily.   PROCTOZONE-HC 2.5 % rectal cream Generic drug:  hydrocortisone Place 1 application rectally 2 (two) times daily.   sodium chloride HYPERTONIC 3 % nebulizer solution Take by nebulization 2 (two) times daily. What changed:    how much to take  when to take this  reasons to take this   SYSTANE 0.4-0.3 % Soln Generic drug:  Polyethyl Glycol-Propyl Glycol Place 1 drop into both eyes at bedtime as needed (for dry eyes).   trimethoprim-polymyxin b ophthalmic solution Commonly known as:  POLYTRIM Place 1 drop into the left eye See admin instructions. Instill 1 drop into the left eye three times a day for the 1st day and 1 drop four times a day on the 2nd day AFTER INJECTION   vitamin B-12 1000 MCG tablet Commonly known as:  CYANOCOBALAMIN Take 1,000 mcg by mouth daily.   Vitamin D3 25 MCG (1000 UT) Caps Take 1,000 Units by mouth daily.      Follow-up Information    [name], MD Follow up in 1 week(s).   Specialty:  Endocrinology Contact information: [place] [address] [phone]        Jacolyn Reedy, MD .   Specialty:  Cardiology Contact information: [address] [place] [address] [phone]           Consultations:  None   Dg Ankle Complete Left  Result Date: 06/25/2018 CLINICAL DATA:  84 y/o  F; fall with left ankle pain. EXAM: DG HIP (WITH OR WITHOUT PELVIS) 2-3V LEFT; LEFT ANKLE COMPLETE - 3+ VIEW; LEFT FOOT - COMPLETE 3+ VIEW; LEFT KNEE - COMPLETE 4+ VIEW COMPARISON:  None. FINDINGS: Pelvis and left hip: Partially visualized right  hemi plasty hardware with cerclage wires of the proximal femur. Hardware is intact. No periprosthetic lucency or fracture identified within the field of view. No pelvic fracture or diastasis. Mild osteoarthrosis of the left hip joint with acetabular fibrocystic degeneration and osteophytosis. Vascular calcifications noted. Left knee: No acute fracture, dislocation, or joint effusion. Mild loss of lateral femorotibial compartment joint space. Mild tricompartmental osteophytosis. Vascular calcifications noted. Left ankle: There is no evidence of hip fracture or dislocation. Ankle mortise is symmetric on these nonstress  views. Talar dome is intact. Dorsal calcaneal enthesophytes. Vascular calcifications noted. Left foot: There is no evidence of hip fracture or dislocation. There is no evidence of arthropathy or other focal bone abnormality. IMPRESSION: 1. No acute fracture or dislocation identified. 2. Mild osteoarthrosis of the left hip and left knee. 3. Partially visualized right proximal femur hardware without apparent hardware related complication. Electronically Signed   By: Kristine Garbe M.D.   On: 06/25/2018 20:09   Ct Head Wo Contrast  Result Date: 06/28/2018 CLINICAL DATA:  Pain following fall EXAM: CT HEAD WITHOUT CONTRAST TECHNIQUE: Contiguous axial images were obtained from the base of the skull through the vertex without intravenous contrast. COMPARISON:  June 25, 2018 FINDINGS: Brain: Age related volume loss is stable. There is no intracranial mass, hemorrhage, extra-axial fluid collection, or midline shift. Small vessel disease throughout the centra semiovale bilaterally appears stable. No acute appearing infarct is evident. Vascular: There is no hyperdense vessel. There is calcification in each carotid siphon region. Skull: There is a left frontal scalp hematoma. No fracture evident. The bony calvarium appears intact. Sinuses/Orbits: There is a small retention cyst in the inferior  posterior right maxillary antrum. There is mucosal thickening in several ethmoid air cells bilaterally. Patient is status post cataract removal on the left. Orbits otherwise appear symmetric bilaterally. Note that there is preseptal soft tissue swelling over the left orbit. Other: Mastoid air cells are clear. Torus palatinus noted incidentally, an anatomic variant. IMPRESSION: Stable age related volume loss with supratentorial small vessel disease. No acute infarct evident. No mass or hemorrhage. Left frontal scalp hematoma.  No fracture evident. Foci of arterial vascular calcification noted. Areas of paranasal sinus disease noted. Electronically Signed   By: Lowella Grip III M.D.   On: 06/28/2018 09:09   Ct Head Wo Contrast  Result Date: 06/25/2018 CLINICAL DATA:  Fall outside onto cement, striking the left side of the face. EXAM: CT HEAD WITHOUT CONTRAST CT MAXILLOFACIAL WITHOUT CONTRAST CT CERVICAL SPINE WITHOUT CONTRAST TECHNIQUE: Multidetector CT imaging of the head, cervical spine, and maxillofacial structures were performed using the standard protocol without intravenous contrast. Multiplanar CT image reconstructions of the cervical spine and maxillofacial structures were also generated. COMPARISON:  CT scans from 07/02/2014 and 03/29/2018 FINDINGS: CT HEAD FINDINGS Brain: The brainstem, cerebellum, cerebral peduncles, thalami, basal ganglia, basilar cisterns, and ventricular system appear within normal limits. Periventricular white matter and corona radiata hypodensities favor chronic ischemic microvascular white matter disease. No intracranial hemorrhage, mass lesion, or acute CVA. Vascular: There is atherosclerotic calcification of the cavernous carotid arteries bilaterally. Skull: Unremarkable Other: Torus palatinus noted. CT MAXILLOFACIAL FINDINGS Osseous: Bilateral mandibular tori. Degenerative arthropathy of both temporomandibular joints, with diminutive size of the left mandibular condyle  and flattening and spurring of the right mandibular condyle with loss of articular space. Several dental cavities. There are some irregularities of the mandible along the alveolar ridge on images 65 through 68 of series 9 anteriorly, but this appears roughly similar to the exam from 04/24/2008 hence I am skeptical of alveolar ridge fracture. No facial fracture is identified. Orbits: Unremarkable Sinuses: Small mucous retention. Cyst in the right maxillary sinus Soft tissues: Mild soft tissue swelling in the subcutaneous tissues anterior to the left maxilla. Bilateral common carotid atherosclerotic calcifications. CT CERVICAL SPINE FINDINGS Alignment: Mildly exaggerated cervical lordosis. Skull base and vertebrae: Loss of articular space and spurring at the anterior C1-2 articulation. No cervical spine fracture or acute bony findings. Soft tissues and spinal canal: Unremarkable Disc  levels: Multilevel facet spurring without overt foraminal impingement identified. Upper chest: Biapical pleuroparenchymal scarring with associated calcifications. Peripheral interstitial accentuation in the lungs stable scarring anteriorly in the left upper lobe. Other: No supplemental non-categorized findings. IMPRESSION: 1. No acute intracranial findings, acute cervical spine findings, or acute facial fracture. 2. Periventricular white matter and corona radiata hypodensities favor chronic ischemic microvascular white matter disease. 3. Degenerative arthropathy of both temporomandibular joints. 4. Mild soft tissue swelling in the subcutaneous tissues anterior to the left maxilla. 5. Atherosclerosis. 6. Biapical pleuroparenchymal scarring with associated calcifications. Electronically Signed   By: Van Clines M.D.   On: 06/25/2018 20:00   Ct Cervical Spine Wo Contrast  Result Date: 06/25/2018 CLINICAL DATA:  Fall outside onto cement, striking the left side of the face. EXAM: CT HEAD WITHOUT CONTRAST CT MAXILLOFACIAL WITHOUT  CONTRAST CT CERVICAL SPINE WITHOUT CONTRAST TECHNIQUE: Multidetector CT imaging of the head, cervical spine, and maxillofacial structures were performed using the standard protocol without intravenous contrast. Multiplanar CT image reconstructions of the cervical spine and maxillofacial structures were also generated. COMPARISON:  CT scans from 07/02/2014 and 03/29/2018 FINDINGS: CT HEAD FINDINGS Brain: The brainstem, cerebellum, cerebral peduncles, thalami, basal ganglia, basilar cisterns, and ventricular system appear within normal limits. Periventricular white matter and corona radiata hypodensities favor chronic ischemic microvascular white matter disease. No intracranial hemorrhage, mass lesion, or acute CVA. Vascular: There is atherosclerotic calcification of the cavernous carotid arteries bilaterally. Skull: Unremarkable Other: Torus palatinus noted. CT MAXILLOFACIAL FINDINGS Osseous: Bilateral mandibular tori. Degenerative arthropathy of both temporomandibular joints, with diminutive size of the left mandibular condyle and flattening and spurring of the right mandibular condyle with loss of articular space. Several dental cavities. There are some irregularities of the mandible along the alveolar ridge on images 65 through 68 of series 9 anteriorly, but this appears roughly similar to the exam from 04/24/2008 hence I am skeptical of alveolar ridge fracture. No facial fracture is identified. Orbits: Unremarkable Sinuses: Small mucous retention. Cyst in the right maxillary sinus Soft tissues: Mild soft tissue swelling in the subcutaneous tissues anterior to the left maxilla. Bilateral common carotid atherosclerotic calcifications. CT CERVICAL SPINE FINDINGS Alignment: Mildly exaggerated cervical lordosis. Skull base and vertebrae: Loss of articular space and spurring at the anterior C1-2 articulation. No cervical spine fracture or acute bony findings. Soft tissues and spinal canal: Unremarkable Disc levels:  Multilevel facet spurring without overt foraminal impingement identified. Upper chest: Biapical pleuroparenchymal scarring with associated calcifications. Peripheral interstitial accentuation in the lungs stable scarring anteriorly in the left upper lobe. Other: No supplemental non-categorized findings. IMPRESSION: 1. No acute intracranial findings, acute cervical spine findings, or acute facial fracture. 2. Periventricular white matter and corona radiata hypodensities favor chronic ischemic microvascular white matter disease. 3. Degenerative arthropathy of both temporomandibular joints. 4. Mild soft tissue swelling in the subcutaneous tissues anterior to the left maxilla. 5. Atherosclerosis. 6. Biapical pleuroparenchymal scarring with associated calcifications. Electronically Signed   By: Van Clines M.D.   On: 06/25/2018 20:00   US Venous Img Lower Unilateral Right  Result Date: 06/28/2018 CLINICAL DATA:  Right lower extremity pain and edema, recent fall EXAM: RIGHT LOWER EXTREMITY VENOUS DOPPLER ULTRASOUND TECHNIQUE: Gray-scale sonography with graded compression, as well as color Doppler and duplex ultrasound were performed to evaluate the lower extremity deep venous systems from the level of the common femoral vein and including the common femoral, femoral, profunda femoral, popliteal and calf veins including the posterior tibial, peroneal and gastrocnemius veins when visible. The  superficial great saphenous vein was also interrogated. Spectral Doppler was utilized to evaluate flow at rest and with distal augmentation maneuvers in the common femoral, femoral and popliteal veins. COMPARISON:  None. FINDINGS: Contralateral Common Femoral Vein: Respiratory phasicity is normal and symmetric with the symptomatic side. No evidence of thrombus. Normal compressibility. Common Femoral Vein: No evidence of thrombus. Normal compressibility, respiratory phasicity and response to augmentation. Saphenofemoral  Junction: No evidence of thrombus. Normal compressibility and flow on color Doppler imaging. Profunda Femoral Vein: No evidence of thrombus. Normal compressibility and flow on color Doppler imaging. Femoral Vein: No evidence of thrombus. Normal compressibility, respiratory phasicity and response to augmentation. Popliteal Vein: No evidence of thrombus. Normal compressibility, respiratory phasicity and response to augmentation. Calf Veins: No evidence of thrombus. Normal compressibility and flow on color Doppler imaging. Superficial Great Saphenous Vein: No evidence of thrombus. Normal compressibility. Venous Reflux:  None. Other Findings:  None. IMPRESSION: No evidence of deep venous thrombosis. Electronically Signed   By: Jerilynn Mages.  Shick M.D.   On: 06/28/2018 15:17   Dg Knee Complete 4 Views Left  Result Date: 06/25/2018 CLINICAL DATA:  84 y/o  F; fall with left ankle pain. EXAM: DG HIP (WITH OR WITHOUT PELVIS) 2-3V LEFT; LEFT ANKLE COMPLETE - 3+ VIEW; LEFT FOOT - COMPLETE 3+ VIEW; LEFT KNEE - COMPLETE 4+ VIEW COMPARISON:  None. FINDINGS: Pelvis and left hip: Partially visualized right hemi plasty hardware with cerclage wires of the proximal femur. Hardware is intact. No periprosthetic lucency or fracture identified within the field of view. No pelvic fracture or diastasis. Mild osteoarthrosis of the left hip joint with acetabular fibrocystic degeneration and osteophytosis. Vascular calcifications noted. Left knee: No acute fracture, dislocation, or joint effusion. Mild loss of lateral femorotibial compartment joint space. Mild tricompartmental osteophytosis. Vascular calcifications noted. Left ankle: There is no evidence of hip fracture or dislocation. Ankle mortise is symmetric on these nonstress views. Talar dome is intact. Dorsal calcaneal enthesophytes. Vascular calcifications noted. Left foot: There is no evidence of hip fracture or dislocation. There is no evidence of arthropathy or other focal bone  abnormality. IMPRESSION: 1. No acute fracture or dislocation identified. 2. Mild osteoarthrosis of the left hip and left knee. 3. Partially visualized right proximal femur hardware without apparent hardware related complication. Electronically Signed   By: Kristine Garbe M.D.   On: 06/25/2018 20:09   Dg Foot Complete Left  Result Date: 06/25/2018 CLINICAL DATA:  84 y/o  F; fall with left ankle pain. EXAM: DG HIP (WITH OR WITHOUT PELVIS) 2-3V LEFT; LEFT ANKLE COMPLETE - 3+ VIEW; LEFT FOOT - COMPLETE 3+ VIEW; LEFT KNEE - COMPLETE 4+ VIEW COMPARISON:  None. FINDINGS: Pelvis and left hip: Partially visualized right hemi plasty hardware with cerclage wires of the proximal femur. Hardware is intact. No periprosthetic lucency or fracture identified within the field of view. No pelvic fracture or diastasis. Mild osteoarthrosis of the left hip joint with acetabular fibrocystic degeneration and osteophytosis. Vascular calcifications noted. Left knee: No acute fracture, dislocation, or joint effusion. Mild loss of lateral femorotibial compartment joint space. Mild tricompartmental osteophytosis. Vascular calcifications noted. Left ankle: There is no evidence of hip fracture or dislocation. Ankle mortise is symmetric on these nonstress views. Talar dome is intact. Dorsal calcaneal enthesophytes. Vascular calcifications noted. Left foot: There is no evidence of hip fracture or dislocation. There is no evidence of arthropathy or other focal bone abnormality. IMPRESSION: 1. No acute fracture or dislocation identified. 2. Mild osteoarthrosis of the left hip and  left knee. 3. Partially visualized right proximal femur hardware without apparent hardware related complication. Electronically Signed   By: Kristine Garbe M.D.   On: 06/25/2018 20:09   Dg Hip Unilat With Pelvis 2-3 Views Left  Result Date: 06/25/2018 CLINICAL DATA:  84 y/o  F; fall with left ankle pain. EXAM: DG HIP (WITH OR WITHOUT PELVIS)  2-3V LEFT; LEFT ANKLE COMPLETE - 3+ VIEW; LEFT FOOT - COMPLETE 3+ VIEW; LEFT KNEE - COMPLETE 4+ VIEW COMPARISON:  None. FINDINGS: Pelvis and left hip: Partially visualized right hemi plasty hardware with cerclage wires of the proximal femur. Hardware is intact. No periprosthetic lucency or fracture identified within the field of view. No pelvic fracture or diastasis. Mild osteoarthrosis of the left hip joint with acetabular fibrocystic degeneration and osteophytosis. Vascular calcifications noted. Left knee: No acute fracture, dislocation, or joint effusion. Mild loss of lateral femorotibial compartment joint space. Mild tricompartmental osteophytosis. Vascular calcifications noted. Left ankle: There is no evidence of hip fracture or dislocation. Ankle mortise is symmetric on these nonstress views. Talar dome is intact. Dorsal calcaneal enthesophytes. Vascular calcifications noted. Left foot: There is no evidence of hip fracture or dislocation. There is no evidence of arthropathy or other focal bone abnormality. IMPRESSION: 1. No acute fracture or dislocation identified. 2. Mild osteoarthrosis of the left hip and left knee. 3. Partially visualized right proximal femur hardware without apparent hardware related complication. Electronically Signed   By: Kristine Garbe M.D.   On: 06/25/2018 20:09   Ct Maxillofacial Wo Cm  Result Date: 06/25/2018 CLINICAL DATA:  Fall outside onto cement, striking the left side of the face. EXAM: CT HEAD WITHOUT CONTRAST CT MAXILLOFACIAL WITHOUT CONTRAST CT CERVICAL SPINE WITHOUT CONTRAST TECHNIQUE: Multidetector CT imaging of the head, cervical spine, and maxillofacial structures were performed using the standard protocol without intravenous contrast. Multiplanar CT image reconstructions of the cervical spine and maxillofacial structures were also generated. COMPARISON:  CT scans from 07/02/2014 and 03/29/2018 FINDINGS: CT HEAD FINDINGS Brain: The brainstem, cerebellum,  cerebral peduncles, thalami, basal ganglia, basilar cisterns, and ventricular system appear within normal limits. Periventricular white matter and corona radiata hypodensities favor chronic ischemic microvascular white matter disease. No intracranial hemorrhage, mass lesion, or acute CVA. Vascular: There is atherosclerotic calcification of the cavernous carotid arteries bilaterally. Skull: Unremarkable Other: Torus palatinus noted. CT MAXILLOFACIAL FINDINGS Osseous: Bilateral mandibular tori. Degenerative arthropathy of both temporomandibular joints, with diminutive size of the left mandibular condyle and flattening and spurring of the right mandibular condyle with loss of articular space. Several dental cavities. There are some irregularities of the mandible along the alveolar ridge on images 65 through 68 of series 9 anteriorly, but this appears roughly similar to the exam from 04/24/2008 hence I am skeptical of alveolar ridge fracture. No facial fracture is identified. Orbits: Unremarkable Sinuses: Small mucous retention. Cyst in the right maxillary sinus Soft tissues: Mild soft tissue swelling in the subcutaneous tissues anterior to the left maxilla. Bilateral common carotid atherosclerotic calcifications. CT CERVICAL SPINE FINDINGS Alignment: Mildly exaggerated cervical lordosis. Skull base and vertebrae: Loss of articular space and spurring at the anterior C1-2 articulation. No cervical spine fracture or acute bony findings. Soft tissues and spinal canal: Unremarkable Disc levels: Multilevel facet spurring without overt foraminal impingement identified. Upper chest: Biapical pleuroparenchymal scarring with associated calcifications. Peripheral interstitial accentuation in the lungs stable scarring anteriorly in the left upper lobe. Other: No supplemental non-categorized findings. IMPRESSION: 1. No acute intracranial findings, acute cervical spine findings, or acute facial  fracture. 2. Periventricular white  matter and corona radiata hypodensities favor chronic ischemic microvascular white matter disease. 3. Degenerative arthropathy of both temporomandibular joints. 4. Mild soft tissue swelling in the subcutaneous tissues anterior to the left maxilla. 5. Atherosclerosis. 6. Biapical pleuroparenchymal scarring with associated calcifications. Electronically Signed   By: Van Clines M.D.   On: 06/25/2018 20:00      Subjective: No major events overnight.  No complaints.  Has not had bowel movement yet.  Denies chest pain, lightheadedness, dyspnea or fever.  Denies abdominal pain, nausea or vomiting.  Ready to go home.  Discharge Exam: Vitals:   07/01/18 0739 07/01/18 0931  BP:  127/79  Pulse:  73  Resp:    Temp:    SpO2: 96%    GENERAL: Frail looking elderly female.  No acute distress.  Significant muscle mass wasting. HEENT: MMM.  Hearing grossly intact.  Bruises over her face.  Hematoma over her left forehead. NECK: Supple.  No JVD.  LUNGS:  No IWOB.  Fair air movement bilaterally. HEART:  RRR. Heart sounds normal.  ABD: Bowel sounds present. Soft. Non tender. EXT: 2+ pitting edema in right leg.  1+ pitting edema left leg. SKIN: Skin process in her face and arms.  Stasis dermatitis in lower extremities. NEURO: Awake, alert and oriented appropriately.  No gross deficit. PSYCH: Calm. Normal affect.  The results of significant diagnostics from this hospitalization (including imaging, microbiology, ancillary and laboratory) are listed below for reference.     Microbiology: No results found for this or any previous visit (from the past 240 hour(s)).   Labs: BNP (last 3 results) Recent Labs    03/29/18 1612  BNP 371.0*   Basic Metabolic Panel: Recent Labs  Lab 06/28/18 0905 06/29/18 0514 07/01/18 0805  NA 140 141 140  K 3.5 3.2* 4.9  CL 96* 101 101  CO2 35* 32 31  GLUCOSE 93 91 97  BUN 19 14 17   CREATININE 0.98 0.83 0.91  CALCIUM 9.2 8.3* 8.7*   Liver Function  Tests: No results for input(s): AST, ALT, ALKPHOS, BILITOT, PROT, ALBUMIN in the last 168 hours. No results for input(s): LIPASE, AMYLASE in the last 168 hours. No results for input(s): AMMONIA in the last 168 hours. CBC: Recent Labs  Lab 06/28/18 0905 06/29/18 0514 06/30/18 0422 07/01/18 0805  WBC 7.0 6.3 6.2 6.2  NEUTROABS 5.3  --   --   --   HGB 11.6* 10.8* 9.8* 10.9*  HCT 38.2 34.8* 31.9* 35.8*  MCV 101.1* 101.2* 101.9* 103.8*  PLT 179 167 165 185   Cardiac Enzymes: No results for input(s): CKTOTAL, CKMB, CKMBINDEX, TROPONINI in the last 168 hours. BNP: Invalid input(s): POCBNP CBG: Recent Labs  Lab 06/25/18 1959  GLUCAP 102*   D-Dimer No results for input(s): DDIMER in the last 72 hours. Hgb A1c No results for input(s): HGBA1C in the last 72 hours. Lipid Profile No results for input(s): CHOL, HDL, LDLCALC, TRIG, CHOLHDL, LDLDIRECT in the last 72 hours. Thyroid function studies No results for input(s): TSH, T4TOTAL, T3FREE, THYROIDAB in the last 72 hours.  Invalid input(s): FREET3 Anemia work up No results for input(s): VITAMINB12, FOLATE, FERRITIN, TIBC, IRON, RETICCTPCT in the last 72 hours. Urinalysis    Component Value Date/Time   COLORURINE COLORLESS (A) 06/28/2018 0826   APPEARANCEUR CLEAR 06/28/2018 0826   LABSPEC 1.005 06/28/2018 0826   PHURINE 8.0 06/28/2018 0826   GLUCOSEU NEGATIVE 06/28/2018 0826   HGBUR NEGATIVE 06/28/2018 0826   BILIRUBINUR  NEGATIVE 06/28/2018 0826   KETONESUR NEGATIVE 06/28/2018 0826   PROTEINUR NEGATIVE 06/28/2018 0826   UROBILINOGEN 0.2 09/21/2014 1325   NITRITE NEGATIVE 06/28/2018 0826   LEUKOCYTESUR NEGATIVE 06/28/2018 0826   Sepsis Labs Invalid input(s): PROCALCITONIN,  WBC,  LACTICIDVEN   Time coordinating discharge: 30 minutes  SIGNED:  Mercy Riding, MD  [facility] 07/01/2018, 1:50 PM Pager [phone]  If 7PM-7AM, please contact night-coverage [email or web address] Password [email or web address]

## 2018-07-01 NOTE — Discharge Instructions (Signed)
It has been a pleasure taking care of you! You were admitted due to fall and possible cellulitis of your leg. We have started you on antibiotic for possible cellulitis.  Your leg swelling could also be just due to venous insufficiency. We are discharging you on more antibiotic to take at home.  We strongly recommend elevating your legs whenever you sit down.  When the infection improves, you may try compression stocking which is available over-the-counter.    We strongly recommend you use your walker for ambulation.  In regards to your blood thinner, we recommend restarting your Eliquis after 07/05/2018.  Please stop your blood thinner and call your primary care doctor office if you notice bright red blood in stool or very dark stool.   There could be other changes made to your home medications during this hospitalization. Please, make sure to read the directions before you take them. The names and directions on how to take these medications are found on this discharge paper under medication section.  Please follow up at your primary care doctor's office in a week  Once you are discharged, your primary care physician will handle any further medical issues. Please note that NO REFILLS for any discharge medications will be authorized once you are discharged, as it is imperative that you return to your primary care physician (or establish a relationship with a primary care physician if you do not have one) for your aftercare needs so that they can reassess your need for medications and monitor your lab values. Take care,

## 2018-07-01 NOTE — Progress Notes (Signed)
IV removed and discharge instructions reviewed with patient and husband.  To pick up medications at pharmacy.

## 2018-07-01 NOTE — Progress Notes (Signed)
Initial Nutrition Assessment  DOCUMENTATION CODES:  Severe malnutrition in context of chronic illness, Underweight  INTERVENTION:  From diet recall, pt eats many high salt foods-likely d/t ease of preparation in setting of debility. Gave education on HF diet and made  recommendations to help reduce her salt intake  Continue EE BID. She has eaten well the past 1-2 days of admission.   NUTRITION DIAGNOSIS:  Severe Malnutrition related to chronic illness(CHF) as evidenced by severe muscle/fat depletion  GOAL:  Patient will meet greater than or equal to 90% of their needs  MONITOR:  Weight trends, Diet advancement, I & O's, Labs, Skin, PO intake  REASON FOR ASSESSMENT:  Consult Assessment of nutrition requirement/status  ASSESSMENT:  84 y/o female PMHx dCHF, Afib, Hiatal hernia, HLD, COPD, CAD, Depression/Anxiety, Chronic Resp Failure. Presented after suffering fall while transferring at home. Suffered forehead laceration and L facial hematoma. While in ED, was also noted to have cellulitis of RLE. Admitted for management  Patient seen w/ husband at bedside. She has discharge orders in place.RD present while nursing/pt discussing disposition. Pt adamant she will not go to SNF. She notes reason she fell is r/t diuretics and swelling.   RD spoke to pt/spouse about nutrition habits at home. Their breakfast is typically [place] sausage, 2 slices toast, coffee w/ creamer, juice and scrambled egg. Their second meal of the day is the largest and Examples of items they consume are Rotisserie chicken or tossed salads with [place] dressing. Dinner is smaller and is usually leftovers or convenience items. Spouse does note frequent consumption of frozen meals & canned soups. They do not use salt shaker.   Pt reports good appetite normally. She drinks ensure in between meals for extra nutrition. She says her spouse "pours vitamins into me". She says she has chronic nausea and often takes pepto bismol or  zofran for relief. She takes this often, but not every day. She also has chronic constipation for which she takes miralax  Pt says her UBW is about 111 lbs. She reports a UBW of 129 lbs. Per chart, pt shows gradual wt loss; she was 129-130 lbs early this year (Feb), 122.3 lbs in May and 117.3 lbs in June.  Current wt is ~112 lbs. She also has significant swelling to RLE.   RD noted several problematic foods and behaviors regarding pts diet. She apparently eats frozen meals and canned soups relatively often, which are some of the worst items she can consume. They also sound to get take out/fast food somewhat regularly. She also uses a high salt dressing on her salads. Highly suspect pt/spouse are turning to convenience items d/t ease of preparation. RD urged them to reevaluate their diet, as it is no doubt contributing to her swelling and related falls/debility. Unfortunately, the spouse readily admits to eating extremely high sodium items and sounds to enable pts high salt tendencies.  RD did note if she went to rehab, she could hopefully become stronger and with the improved tolerance be better able to prepare healthy meals/avoid high salt ones, thus keeping her healthy and out of hospital. She again says she wont go to SNF.   Labs: K:3.2, BNP >2500 on admit Meds: D3, Ensure Enlive, Lasix, Folate, MVI w/ Lutein, Miralax, B12  Recent Labs  Lab 06/28/18 0905 06/29/18 0514 07/01/18 0805  NA 140 141 140  K 3.5 3.2* 4.9  CL 96* 101 101  CO2 35* 32 31  BUN 19 14 17   CREATININE 0.98 0.83 0.91  CALCIUM 9.2 8.3* 8.7*  GLUCOSE 93 91 97   NUTRITION - FOCUSED PHYSICAL EXAM:   Most Recent Value  Orbital Region  Severe depletion  Upper Arm Region  Severe depletion  Thoracic and Lumbar Region  Severe depletion  Buccal Region  Severe depletion  Temple Region  Moderate depletion  Clavicle Bone Region  Severe depletion  Clavicle and Acromion Bone Region  Severe depletion  Scapular Bone Region   Unable to assess  Dorsal Hand  Moderate depletion  Patellar Region  Unable to assess  Anterior Thigh Region  Severe depletion  Posterior Calf Region  Severe depletion  Edema (RD Assessment)  Moderate  Hair  Reviewed  Eyes  Reviewed  Mouth  Reviewed  Skin  Reviewed  Nails  Reviewed     Diet Order:   Diet Order            Diet - low sodium heart healthy        Diet regular Room service appropriate? Yes; Fluid consistency: Thin  Diet effective now             EDUCATION NEEDS:  Education needs have been addressed  Skin: Large area of Ecchymosis: L side of face Cellulitis to RLE Skin tear to L arm  Last BM:  11/28  Height:  Ht Readings from Last 1 Encounters:  06/28/18 5\' 7"  (1.702 m)   Weight:  Wt Readings from Last 1 Encounters:  07/01/18 51.1 kg   Wt Readings from Last 10 Encounters:  07/01/18 51.1 kg  06/25/18 49.9 kg  06/15/18 50 kg  04/27/18 49 kg  04/19/18 50.8 kg  04/06/18 49.8 kg  03/16/18 45.4 kg  01/20/18 53.2 kg  12/08/17 55.5 kg  09/22/17 58.5 kg   Ideal Body Weight:  61.36 kg  BMI:  Body mass index is 17.64 kg/m.  Estimated Nutritional Needs:  Kcal:  1600-1800 (31-35 kcal/kg bw) Protein:  82-97g Pro (1.6-1.9g/kg bw) Fluid:  Per MD fluid goals  Burtis Junes RD, LDN, CNSC Clinical Nutrition Available Tues-Sat via Pager: [phone] 07/01/2018 5:02 PM

## 2018-07-01 NOTE — Consult Note (Signed)
Referring Provider: No ref. provider found Primary Care Physician:  [name], MD Primary Gastroenterologist:  Dr.Armbruster  Reason for Consultation: Hemoccult-positive stool; Eliquis therapy  HPI: Frail, elderly 84 year old lady with numerous comorbidities including heart failure and atrial fibrillation chronically anticoagulated admitted to the hospital 2 days ago after a mechanical fall.  Patient sustained significant soft tissue injury and facial lacerations resulting in significant blood loss at home.  Lacerations required suturing in the ED.  He was also found to have significant lower extremity cellulitis for which she was started on antibiotics.  Eliquis is now been on hold for at least 3 days.  Although about a gram drop in hemoglobin noted yesterday, she is rebounded as of today (10.9);  still hemoglobin a little less than recent baseline.  No hematemesis, melena or hematochezia. States her only GI complaint is hemorrhoids constipation.  States she is eating very well (nursing staff corroborates that she has been finishing her regular meal trays ).    Seen by [facility] earlier in the year for nausea, failure to thrive, constipation and weight loss.  She was deemed  not to be a candidate for any endoscopic evaluation.  Patient denies a history of GI illness.  No history of peptic ulcer disease.  Tells me she had a colonoscopy many many years ago and had diverticulosis.  She has hemorrhoids that "poke out from time to time"  in the setting of constipation.    Past Medical History:  Diagnosis Date  . Anxiety   . Arthritis   . CHF (congestive heart failure) ([place])   . Complication of anesthesia    " I have to have a spinal beacuse my lungs & heart are bad "  . COPD (chronic obstructive pulmonary disease) ([place])   . Coronary artery disease   . Depression   . Fibromyalgia   . Hiatal hernia   . Hyperlipidemia   . Irregular heartbeat   . On home oxygen therapy    "2L at  night and prn" (03/29/2018)  . Osteoporosis   . Peripheral neuropathy 08/30/2017  . Rotator cuff tear    RIGHT  . UTI (urinary tract infection) 09/2014    Past Surgical History:  Procedure Laterality Date  . APPENDECTOMY    . CATARACT EXTRACTION Left   . DILATION AND CURETTAGE OF UTERUS     x2  . ELBOW SURGERY     Right  . HIP ARTHROPLASTY Right 09/22/2014   Procedure: ARTHROPLASTY BIPOLAR HIP;  Surgeon: [name], MD;  Location: [facility];  Service: Orthopedics;  Laterality: Right;  . TONSILLECTOMY    . TOTAL HIP REVISION Right 10/13/2014   Procedure: REVISION RIGHT TOTAL HIP POSTERIOR ;  Surgeon: Rod Can, MD;  Location: [facility];  Service: Orthopedics;  Laterality: Right;    Prior to Admission medications   Medication Sig Start Date End Date Taking? Authorizing Provider  acetaminophen (TYLENOL) 500 MG tablet Take 2 tablets (1,000 mg total) by mouth every 8 (eight) hours. Patient taking differently: Take 1,000 mg by mouth every 8 (eight) hours as needed for mild pain or headache.  10/15/14  Yes Swinteck, Aaron Edelman, MD  albuterol (PROVENTIL HFA;VENTOLIN HFA) 108 (90 Base) MCG/ACT inhaler Inhale 2 puffs into the lungs every 6 (six) hours as needed. Patient taking differently: Inhale 2 puffs into the lungs every 6 (six) hours as needed for wheezing or shortness of breath.  01/21/17  Yes Juanito Doom, MD  bisacodyl (DULCOLAX) 10 MG suppository  Place 1 suppository (10 mg total) rectally daily as needed for moderate constipation. 04/06/18  Yes Bonnielee Haff, MD  budesonide-formoterol [facility]) 80-4.5 MCG/ACT inhaler Inhale 2 puffs into the lungs 2 (two) times daily. Patient taking differently: Inhale 2 puffs into the lungs 2 (two) times daily as needed (for "flares").  09/21/17  Yes Tanda Rockers, MD  Cholecalciferol (VITAMIN D3) 1000 UNITS CAPS Take 1,000 Units by mouth daily.    Yes [provider]  clorazepate (TRANXENE) 7.5 MG tablet Take 7.5 mg by mouth at bedtime.    Yes  [provider]  diltiazem (CARDIZEM CD) 360 MG 24 hr capsule Take 1 capsule (360 mg total) by mouth daily. 04/06/18  Yes Bonnielee Haff, MD  ELIQUIS 2.5 MG TABS tablet TAKE 1 TABLET BY MOUTH TWICE DAILY Patient taking differently: Take 2.5 mg by mouth 2 (two) times daily.  06/20/18  Yes Park Liter, MD  folic acid (FOLVITE) 829 MCG tablet Take 400 mcg by mouth daily.   Yes [provider]  furosemide (LASIX) 20 MG tablet Take 2 tablets (40 mg total) by mouth daily. 06/17/18  Yes Park Liter, MD  gabapentin (NEURONTIN) 100 MG capsule Take 300 mg by mouth at bedtime.   Yes [provider]  hydrocortisone (ANUSOL-HC) 25 MG suppository Place 25 mg rectally as needed for hemorrhoids.    Yes [provider]  levothyroxine (SYNTHROID, LEVOTHROID) 75 MCG tablet Take 75 mcg by mouth daily.     Yes [provider]  magnesium hydroxide (MILK OF MAGNESIA) 400 MG/5ML suspension Take 5-15 mLs by mouth daily as needed for mild constipation.    Yes [provider]  Multiple Vitamins-Minerals (ICAPS AREDS 2) CAPS Take 1 capsule by mouth 2 (two) times daily.    Yes [provider]  nitroGLYCERIN (NITROSTAT) 0.4 MG SL tablet Place 0.4 mg under the tongue every 5 (five) minutes as needed for chest pain.    Yes [provider]  ondansetron (ZOFRAN) 4 MG tablet TAKE 1 TABLET BY MOUTH EVERY 8 HOURS AS NEEDED FOR NAUSEA OR VOMITING Patient taking differently: Take 4 mg by mouth every 8 (eight) hours as needed for nausea or vomiting.  04/14/18  Yes Zehr, Janett Billow D, PA-C  OXYGEN Inhale 2 L into the lungs at bedtime. As instructed   Yes [provider]  Polyethyl Glycol-Propyl Glycol (SYSTANE) 0.4-0.3 % SOLN Place 1 drop into both eyes at bedtime as needed (for dry eyes).    Yes [provider]  PROCTOZONE-HC 2.5 % rectal cream Place 1 application rectally 2 (two) times daily.  06/20/18  Yes [provider]    sodium chloride HYPERTONIC 3 % nebulizer solution Take by nebulization 2 (two) times daily. Patient taking differently: Take 4 mLs by nebulization 2 (two) times daily as needed for other or cough.  01/21/17  Yes Juanito Doom, MD  trimethoprim-polymyxin b (POLYTRIM) ophthalmic solution Place 1 drop into the left eye See admin instructions. Instill 1 drop into the left eye three times a day for the 1st day and 1 drop four times a day on the 2nd day AFTER INJECTION 03/14/18  Yes [provider]  vitamin B-12 (CYANOCOBALAMIN) 1000 MCG tablet Take 1,000 mcg by mouth daily.   Yes [provider]    Current Facility-Administered Medications  Medication Dose Route Frequency Provider Last Rate Last Dose  . 0.9 %  sodium chloride infusion   Intravenous PRN Orson Eva, MD 10 mL/hr at 06/29/18 0326    .  acetaminophen (TYLENOL) tablet 650 mg  650 mg Oral Q6H PRN Tat, Shanon Brow, MD   650 mg at 06/30/18 2110   Or  . acetaminophen (TYLENOL) suppository 650 mg  650 mg Rectal Q6H PRN Tat, David, MD      . cephALEXin (KEFLEX) capsule 500 mg  500 mg Oral Q12H Wendee Beavers T, MD   500 mg at 07/01/18 0928  . cholecalciferol (VITAMIN D3) tablet 1,000 Units  1,000 Units Oral Daily Tat, Shanon Brow, MD   1,000 Units at 07/01/18 [phone]  . clorazepate (TRANXENE) tablet 7.5 mg  7.5 mg Oral QHS Tat, Shanon Brow, MD   7.5 mg at 06/30/18 2110  . diltiazem (CARDIZEM CD) 24 hr capsule 360 mg  360 mg Oral Daily Tat, David, MD   360 mg at 07/01/18 0928  . DULoxetine (CYMBALTA) DR capsule 30 mg  30 mg Oral QHS Tat, David, MD      . feeding supplement (ENSURE ENLIVE) (ENSURE ENLIVE) liquid 237 mL  237 mL Oral TID BM Gonfa, Taye T, MD      . folic acid (FOLVITE) tablet 1 mg  1,000 mcg Oral Daily Tat, David, MD   1 mg at 07/01/18 3818  . furosemide (LASIX) tablet 40 mg  40 mg Oral Daily Tat, Shanon Brow, MD   40 mg at 07/01/18 2993  . gabapentin (NEURONTIN) capsule 300 mg  300 mg Oral QHS Bodenheimer, Charles A, NP   300 mg at 06/30/18  2130  . ipratropium-albuterol (DUONEB) 0.5-2.5 (3) MG/3ML nebulizer solution 3 mL  3 mL Nebulization TID Wendee Beavers T, MD   3 mL at 07/01/18 0737  . levothyroxine (SYNTHROID, LEVOTHROID) tablet 75 mcg  75 mcg Oral Daily Tat, Shanon Brow, MD   75 mcg at 07/01/18 0603  . mometasone-formoterol (DULERA) 100-5 MCG/ACT inhaler 2 puff  2 puff Inhalation BID Orson Eva, MD   2 puff at 07/01/18 0739  . multivitamin-lutein (OCUVITE-LUTEIN) capsule   Oral BID Orson Eva, MD   1 capsule at 07/01/18 0928  . ondansetron (ZOFRAN) tablet 4 mg  4 mg Oral Q6H PRN Tat, David, MD       Or  . ondansetron (ZOFRAN) injection 4 mg  4 mg Intravenous Q6H PRN Tat, David, MD      . pantoprazole (PROTONIX) injection 40 mg  40 mg Intravenous Q12H Wendee Beavers T, MD   40 mg at 07/01/18 0927  . polyvinyl alcohol (LIQUIFILM TEARS) 1.4 % ophthalmic solution 1 drop  1 drop Both Eyes QHS PRN Tat, David, MD      . vitamin B-12 (CYANOCOBALAMIN) tablet 1,000 mcg  1,000 mcg Oral Daily Tat, David, MD   1,000 mcg at 07/01/18 7169    Allergies as of 06/28/2018 - Review Complete 06/28/2018  Allergen Reaction Noted  . Shrimp [shellfish allergy] Nausea And Vomiting, Swelling, and Other (See Comments) 03/25/2015  . Fish allergy Nausea And Vomiting, Swelling, and Other (See Comments) 09/21/2014  . Adhesive [tape] Other (See Comments) 03/29/2018  . Other Other (See Comments) 02/12/2016    Family History  Problem Relation Age of Onset  . Pancreatic cancer Father   . Heart failure Mother   . Hypertension Mother   . Heart attack Brother   . Hypertension Brother   . Stroke Brother   . Heart attack Sister   . Diabetes Son   . Obesity Son   . Heart attack Son     Social History   Socioeconomic History  . Marital status: Married    Spouse  name: widowed  . Number of children: 1  . Years of education: 21  . Highest education level: Not on file  Occupational History  . Occupation: retired.  prev worked [employer]  .  Financial resource strain: Not on file  . Food insecurity:    Worry: Not on file    Inability: Not on file  . Transportation needs:    Medical: Not on file    Non-medical: Not on file  Tobacco Use  . Smoking status: Never Smoker  . Smokeless tobacco: Never Used  Substance and Sexual Activity  . Alcohol use: No  . Drug use: No  . Sexual activity: Not on file  Lifestyle  . Physical activity:    Days per week: Not on file    Minutes per session: Not on file  . Stress: Not on file  Relationships  . Social connections:    Talks on phone: Not on file    Gets together: Not on file    Attends religious service: Not on file    Active member of club or organization: Not on file    Attends meetings of clubs or organizations: Not on file    Relationship status: Not on file  . Intimate partner violence:    Fear of current or ex partner: Not on file    Emotionally abused: Not on file    Physically abused: Not on file    Forced sexual activity: Not on file  Other Topics Concern  . Not on file  Social History Narrative   Lives with husand   Caffeine use: 1.5 cups coffee per day and pepsi   Right handed     Review of Systems:   Physical Exam: Vital signs in last 24 hours: Temp:  [97.7 F (36.5 C)] 97.7 F (36.5 C) (11/29 0620) Pulse Rate:  [57-87] 73 (11/29 0931) Resp:  [17-18] 18 (11/28 2115) BP: (93-127)/(60-93) 127/79 (11/29 0931) SpO2:  [96 %-100 %] 96 % (11/29 0739) Weight:  [51.1 kg] 51.1 kg (11/29 0607) Last BM Date: 06/30/18 General:   Frail, elderly lady who is pleasant alert conversant and oriented.  Has facial lacerations and multiple areas of bruising on her face and upper extremities  Neck:  Supple; no masses or thyromegaly. Lungs: Decreased breath sounds.  Poor inspiratory effort.   No wheezes, crackles, or rhonchi. No acute distress. Heart:  Regular rate and rhythm; no murmurs, clicks, rubs,  or gallops. Abdomen:  Soft, nontender and nondistended. No masses,  hepatosplenomegaly or hernias noted. Normal bowel sounds, without guarding, and without rebound.   Rectal: External examination performed.  At least grade 2 hemorrhoids.  No mass appreciated.  Already determined to be occult blood positive.  Intake/Output from previous day: 11/28 0701 - 11/29 0700 In: 1200 [P.O.:1200] Out: 2050 [Urine:2050] Intake/Output this shift: Total I/O In: 240 [P.O.:240] Out: -   Lab Results: Recent Labs    06/29/18 0514 06/30/18 0422 07/01/18 0805  WBC 6.3 6.2 6.2  HGB 10.8* 9.8* 10.9*  HCT 34.8* 31.9* 35.8*  PLT 167 165 185   BMET Recent Labs    06/29/18 0514 07/01/18 0805  NA 141 140  K 3.2* 4.9  CL 101 101  CO2 32 31  GLUCOSE 91 97  BUN 14 17  CREATININE 0.83 0.91  CALCIUM 8.3* 8.7*     Impression: Very pleasant frail elderly lady with multiple comorbidities on chronic anticoagulation therapy admitted with recent trauma and lower extremity cellulitis.  She  has not had any overt GI bleeding.  She is occult positive.   Significant hemorrhoid issues in the setting of chronic constipation with straining. She is also had recent blood loss from lacerations and soft tissue trauma.  He has a history of macrocytic anemia. History of failure to thrive over the past several months with weight loss, nausea and vomiting-the latter symptoms appear to have improved.  Her only GI symptoms are that of intermittent chronic constipation and hemorrhoids.  At this time, I do not feel that this patient has had a significant GI bleed.  Anemia likely multifactorial in etiology.  Moreover, I agree with [facility], she is really not a fit candidate for either an EGD or colonoscopy at this time.  Recommendations:   #1.  No indication for either EGD or colonoscopy at this time.  #2.  Once daily PPI therapy -empirically.  MiraLAX 17 g orally twice daily-titrate for effective management of constipation  #3.  Nutritional supplements between meals.  #4.  Assure that  serum B12 and folate levels are normal  #5.  From a GI standpoint, would not necessarily withhold ongoing anticoagulation therapy as the benefits appear to still outweigh the risks.  However, given recent trauma with laceration and bruising, might be best practice to hold off on resuming anticoagulation for another 3 to 5 days (per attending).  Regardless, would continue to monitor hemoglobin as an outpatient.  #6.  Follow-up with [facility] and close interval as an outpatient.        Notice:  This dictation was prepared with Dragon dictation along with smaller phrase technology. Any transcriptional errors that result from this process are unintentional and may not be corrected upon review.

## 2018-07-03 DIAGNOSIS — Z7951 Long term (current) use of inhaled steroids: Secondary | ICD-10-CM | POA: Diagnosis not present

## 2018-07-03 DIAGNOSIS — G6289 Other specified polyneuropathies: Secondary | ICD-10-CM | POA: Diagnosis not present

## 2018-07-03 DIAGNOSIS — L03115 Cellulitis of right lower limb: Secondary | ICD-10-CM | POA: Diagnosis not present

## 2018-07-03 DIAGNOSIS — Z79899 Other long term (current) drug therapy: Secondary | ICD-10-CM | POA: Diagnosis not present

## 2018-07-03 DIAGNOSIS — W010XXD Fall on same level from slipping, tripping and stumbling without subsequent striking against object, subsequent encounter: Secondary | ICD-10-CM | POA: Diagnosis not present

## 2018-07-03 DIAGNOSIS — I5032 Chronic diastolic (congestive) heart failure: Secondary | ICD-10-CM | POA: Diagnosis not present

## 2018-07-03 DIAGNOSIS — J449 Chronic obstructive pulmonary disease, unspecified: Secondary | ICD-10-CM | POA: Diagnosis not present

## 2018-07-03 DIAGNOSIS — Z9181 History of falling: Secondary | ICD-10-CM | POA: Diagnosis not present

## 2018-07-03 DIAGNOSIS — G629 Polyneuropathy, unspecified: Secondary | ICD-10-CM | POA: Diagnosis not present

## 2018-07-03 DIAGNOSIS — D510 Vitamin B12 deficiency anemia due to intrinsic factor deficiency: Secondary | ICD-10-CM | POA: Diagnosis not present

## 2018-07-03 DIAGNOSIS — M810 Age-related osteoporosis without current pathological fracture: Secondary | ICD-10-CM | POA: Diagnosis not present

## 2018-07-03 DIAGNOSIS — I480 Paroxysmal atrial fibrillation: Secondary | ICD-10-CM | POA: Diagnosis not present

## 2018-07-03 DIAGNOSIS — I2510 Atherosclerotic heart disease of native coronary artery without angina pectoris: Secondary | ICD-10-CM | POA: Diagnosis not present

## 2018-07-03 DIAGNOSIS — Z96641 Presence of right artificial hip joint: Secondary | ICD-10-CM | POA: Diagnosis not present

## 2018-07-03 DIAGNOSIS — I872 Venous insufficiency (chronic) (peripheral): Secondary | ICD-10-CM | POA: Diagnosis not present

## 2018-07-03 DIAGNOSIS — S0083XD Contusion of other part of head, subsequent encounter: Secondary | ICD-10-CM | POA: Diagnosis not present

## 2018-07-03 DIAGNOSIS — E43 Unspecified severe protein-calorie malnutrition: Secondary | ICD-10-CM | POA: Diagnosis not present

## 2018-07-03 DIAGNOSIS — Z7901 Long term (current) use of anticoagulants: Secondary | ICD-10-CM | POA: Diagnosis not present

## 2018-07-04 ENCOUNTER — Other Ambulatory Visit: Payer: Self-pay

## 2018-07-04 DIAGNOSIS — L03115 Cellulitis of right lower limb: Secondary | ICD-10-CM | POA: Diagnosis not present

## 2018-07-04 DIAGNOSIS — J449 Chronic obstructive pulmonary disease, unspecified: Secondary | ICD-10-CM | POA: Diagnosis not present

## 2018-07-04 DIAGNOSIS — S0083XD Contusion of other part of head, subsequent encounter: Secondary | ICD-10-CM | POA: Diagnosis not present

## 2018-07-04 DIAGNOSIS — I5032 Chronic diastolic (congestive) heart failure: Secondary | ICD-10-CM | POA: Diagnosis not present

## 2018-07-04 DIAGNOSIS — I872 Venous insufficiency (chronic) (peripheral): Secondary | ICD-10-CM | POA: Diagnosis not present

## 2018-07-04 DIAGNOSIS — I480 Paroxysmal atrial fibrillation: Secondary | ICD-10-CM | POA: Diagnosis not present

## 2018-07-04 NOTE — Patient Outreach (Addendum)
[facility]) Care Management  07/04/2018  [name] 15-May-1927 [id number]   EMMI- General Discharge RED ON EMMI ALERT Day # 1 Date: 07/03/18 Red Alert Reason:  Read discharge papers? No  Unfilled prescriptions? Yes    Outreach attempt: spoke with patient.  Addressed red alerts with patient.  She states when the call came in she did not half answer the questions.  Patient states that her husband has gone over discharge papers and that she has all her medications.  She states that Ursa came by yesterday and that someone is supposed to come today.  Patient states that it has been rough the last day or so but is glad to be home.  She states she has the assistance of her husband.  Patient states she will be making appointments with her doctors for follow up today and declined needing any assistance with making appointments.  Discussed importance of follow up appointments and signs of worsening breathing and cellulitis.  She verbalized understanding.    Discussed [facility] services with patient to follow post discharge.  Patient declines any present needs but is agreeable to receive brochure.     Plan: RN CM will send letter and brochure and close case.     Jone Baseman, RN, MSN [facility] Care Management Care Management Coordinator Direct Line [phone] Toll Free: [phone]  Fax: [phone]

## 2018-07-06 DIAGNOSIS — I480 Paroxysmal atrial fibrillation: Secondary | ICD-10-CM | POA: Diagnosis not present

## 2018-07-06 DIAGNOSIS — J449 Chronic obstructive pulmonary disease, unspecified: Secondary | ICD-10-CM | POA: Diagnosis not present

## 2018-07-06 DIAGNOSIS — L03115 Cellulitis of right lower limb: Secondary | ICD-10-CM | POA: Diagnosis not present

## 2018-07-06 DIAGNOSIS — W19XXXA Unspecified fall, initial encounter: Secondary | ICD-10-CM | POA: Diagnosis not present

## 2018-07-06 DIAGNOSIS — E876 Hypokalemia: Secondary | ICD-10-CM | POA: Diagnosis not present

## 2018-07-06 DIAGNOSIS — I509 Heart failure, unspecified: Secondary | ICD-10-CM | POA: Diagnosis not present

## 2018-07-06 DIAGNOSIS — Z681 Body mass index (BMI) 19 or less, adult: Secondary | ICD-10-CM | POA: Diagnosis not present

## 2018-07-06 DIAGNOSIS — E038 Other specified hypothyroidism: Secondary | ICD-10-CM | POA: Diagnosis not present

## 2018-07-06 DIAGNOSIS — F418 Other specified anxiety disorders: Secondary | ICD-10-CM | POA: Diagnosis not present

## 2018-07-06 DIAGNOSIS — K922 Gastrointestinal hemorrhage, unspecified: Secondary | ICD-10-CM | POA: Diagnosis not present

## 2018-07-07 DIAGNOSIS — I480 Paroxysmal atrial fibrillation: Secondary | ICD-10-CM | POA: Diagnosis not present

## 2018-07-07 DIAGNOSIS — L03115 Cellulitis of right lower limb: Secondary | ICD-10-CM | POA: Diagnosis not present

## 2018-07-07 DIAGNOSIS — J449 Chronic obstructive pulmonary disease, unspecified: Secondary | ICD-10-CM | POA: Diagnosis not present

## 2018-07-07 DIAGNOSIS — I5032 Chronic diastolic (congestive) heart failure: Secondary | ICD-10-CM | POA: Diagnosis not present

## 2018-07-07 DIAGNOSIS — S0083XD Contusion of other part of head, subsequent encounter: Secondary | ICD-10-CM | POA: Diagnosis not present

## 2018-07-07 DIAGNOSIS — I872 Venous insufficiency (chronic) (peripheral): Secondary | ICD-10-CM | POA: Diagnosis not present

## 2018-07-11 DIAGNOSIS — L03115 Cellulitis of right lower limb: Secondary | ICD-10-CM | POA: Diagnosis not present

## 2018-07-11 DIAGNOSIS — I5032 Chronic diastolic (congestive) heart failure: Secondary | ICD-10-CM | POA: Diagnosis not present

## 2018-07-11 DIAGNOSIS — I480 Paroxysmal atrial fibrillation: Secondary | ICD-10-CM | POA: Diagnosis not present

## 2018-07-11 DIAGNOSIS — I872 Venous insufficiency (chronic) (peripheral): Secondary | ICD-10-CM | POA: Diagnosis not present

## 2018-07-11 DIAGNOSIS — S0083XD Contusion of other part of head, subsequent encounter: Secondary | ICD-10-CM | POA: Diagnosis not present

## 2018-07-11 DIAGNOSIS — J449 Chronic obstructive pulmonary disease, unspecified: Secondary | ICD-10-CM | POA: Diagnosis not present

## 2018-07-12 DIAGNOSIS — I480 Paroxysmal atrial fibrillation: Secondary | ICD-10-CM | POA: Diagnosis not present

## 2018-07-12 DIAGNOSIS — I872 Venous insufficiency (chronic) (peripheral): Secondary | ICD-10-CM | POA: Diagnosis not present

## 2018-07-12 DIAGNOSIS — J449 Chronic obstructive pulmonary disease, unspecified: Secondary | ICD-10-CM | POA: Diagnosis not present

## 2018-07-12 DIAGNOSIS — L03115 Cellulitis of right lower limb: Secondary | ICD-10-CM | POA: Diagnosis not present

## 2018-07-12 DIAGNOSIS — S0083XD Contusion of other part of head, subsequent encounter: Secondary | ICD-10-CM | POA: Diagnosis not present

## 2018-07-12 DIAGNOSIS — I5032 Chronic diastolic (congestive) heart failure: Secondary | ICD-10-CM | POA: Diagnosis not present

## 2018-07-14 DIAGNOSIS — S0083XD Contusion of other part of head, subsequent encounter: Secondary | ICD-10-CM | POA: Diagnosis not present

## 2018-07-14 DIAGNOSIS — J449 Chronic obstructive pulmonary disease, unspecified: Secondary | ICD-10-CM | POA: Diagnosis not present

## 2018-07-14 DIAGNOSIS — I872 Venous insufficiency (chronic) (peripheral): Secondary | ICD-10-CM | POA: Diagnosis not present

## 2018-07-14 DIAGNOSIS — I5032 Chronic diastolic (congestive) heart failure: Secondary | ICD-10-CM | POA: Diagnosis not present

## 2018-07-14 DIAGNOSIS — L03115 Cellulitis of right lower limb: Secondary | ICD-10-CM | POA: Diagnosis not present

## 2018-07-14 DIAGNOSIS — I480 Paroxysmal atrial fibrillation: Secondary | ICD-10-CM | POA: Diagnosis not present

## 2018-07-19 DIAGNOSIS — I480 Paroxysmal atrial fibrillation: Secondary | ICD-10-CM | POA: Diagnosis not present

## 2018-07-19 DIAGNOSIS — L03115 Cellulitis of right lower limb: Secondary | ICD-10-CM | POA: Diagnosis not present

## 2018-07-19 DIAGNOSIS — D500 Iron deficiency anemia secondary to blood loss (chronic): Secondary | ICD-10-CM | POA: Diagnosis not present

## 2018-07-19 DIAGNOSIS — J449 Chronic obstructive pulmonary disease, unspecified: Secondary | ICD-10-CM | POA: Diagnosis not present

## 2018-07-19 DIAGNOSIS — I5032 Chronic diastolic (congestive) heart failure: Secondary | ICD-10-CM | POA: Diagnosis not present

## 2018-07-19 DIAGNOSIS — I8310 Varicose veins of unspecified lower extremity with inflammation: Secondary | ICD-10-CM | POA: Diagnosis not present

## 2018-07-19 DIAGNOSIS — S0083XD Contusion of other part of head, subsequent encounter: Secondary | ICD-10-CM | POA: Diagnosis not present

## 2018-07-19 DIAGNOSIS — I872 Venous insufficiency (chronic) (peripheral): Secondary | ICD-10-CM | POA: Diagnosis not present

## 2018-07-19 DIAGNOSIS — Z681 Body mass index (BMI) 19 or less, adult: Secondary | ICD-10-CM | POA: Diagnosis not present

## 2018-07-19 DIAGNOSIS — K922 Gastrointestinal hemorrhage, unspecified: Secondary | ICD-10-CM | POA: Diagnosis not present

## 2018-07-20 DIAGNOSIS — S0083XD Contusion of other part of head, subsequent encounter: Secondary | ICD-10-CM | POA: Diagnosis not present

## 2018-07-20 DIAGNOSIS — J449 Chronic obstructive pulmonary disease, unspecified: Secondary | ICD-10-CM | POA: Diagnosis not present

## 2018-07-20 DIAGNOSIS — I872 Venous insufficiency (chronic) (peripheral): Secondary | ICD-10-CM | POA: Diagnosis not present

## 2018-07-20 DIAGNOSIS — L03115 Cellulitis of right lower limb: Secondary | ICD-10-CM | POA: Diagnosis not present

## 2018-07-20 DIAGNOSIS — I5032 Chronic diastolic (congestive) heart failure: Secondary | ICD-10-CM | POA: Diagnosis not present

## 2018-07-20 DIAGNOSIS — I480 Paroxysmal atrial fibrillation: Secondary | ICD-10-CM | POA: Diagnosis not present

## 2018-07-25 DIAGNOSIS — S0083XD Contusion of other part of head, subsequent encounter: Secondary | ICD-10-CM | POA: Diagnosis not present

## 2018-07-25 DIAGNOSIS — I5032 Chronic diastolic (congestive) heart failure: Secondary | ICD-10-CM | POA: Diagnosis not present

## 2018-07-25 DIAGNOSIS — I872 Venous insufficiency (chronic) (peripheral): Secondary | ICD-10-CM | POA: Diagnosis not present

## 2018-07-25 DIAGNOSIS — L03115 Cellulitis of right lower limb: Secondary | ICD-10-CM | POA: Diagnosis not present

## 2018-07-25 DIAGNOSIS — I480 Paroxysmal atrial fibrillation: Secondary | ICD-10-CM | POA: Diagnosis not present

## 2018-07-25 DIAGNOSIS — J449 Chronic obstructive pulmonary disease, unspecified: Secondary | ICD-10-CM | POA: Diagnosis not present

## 2018-07-26 DIAGNOSIS — I872 Venous insufficiency (chronic) (peripheral): Secondary | ICD-10-CM | POA: Diagnosis not present

## 2018-07-26 DIAGNOSIS — I480 Paroxysmal atrial fibrillation: Secondary | ICD-10-CM | POA: Diagnosis not present

## 2018-07-26 DIAGNOSIS — I5032 Chronic diastolic (congestive) heart failure: Secondary | ICD-10-CM | POA: Diagnosis not present

## 2018-07-26 DIAGNOSIS — S0083XD Contusion of other part of head, subsequent encounter: Secondary | ICD-10-CM | POA: Diagnosis not present

## 2018-07-26 DIAGNOSIS — J449 Chronic obstructive pulmonary disease, unspecified: Secondary | ICD-10-CM | POA: Diagnosis not present

## 2018-07-26 DIAGNOSIS — L03115 Cellulitis of right lower limb: Secondary | ICD-10-CM | POA: Diagnosis not present

## 2018-08-01 DIAGNOSIS — I480 Paroxysmal atrial fibrillation: Secondary | ICD-10-CM | POA: Diagnosis not present

## 2018-08-01 DIAGNOSIS — L03115 Cellulitis of right lower limb: Secondary | ICD-10-CM | POA: Diagnosis not present

## 2018-08-01 DIAGNOSIS — I5032 Chronic diastolic (congestive) heart failure: Secondary | ICD-10-CM | POA: Diagnosis not present

## 2018-08-01 DIAGNOSIS — S0083XD Contusion of other part of head, subsequent encounter: Secondary | ICD-10-CM | POA: Diagnosis not present

## 2018-08-01 DIAGNOSIS — J449 Chronic obstructive pulmonary disease, unspecified: Secondary | ICD-10-CM | POA: Diagnosis not present

## 2018-08-01 DIAGNOSIS — I872 Venous insufficiency (chronic) (peripheral): Secondary | ICD-10-CM | POA: Diagnosis not present

## 2018-08-02 DIAGNOSIS — I872 Venous insufficiency (chronic) (peripheral): Secondary | ICD-10-CM | POA: Diagnosis not present

## 2018-08-02 DIAGNOSIS — I5032 Chronic diastolic (congestive) heart failure: Secondary | ICD-10-CM | POA: Diagnosis not present

## 2018-08-02 DIAGNOSIS — I480 Paroxysmal atrial fibrillation: Secondary | ICD-10-CM | POA: Diagnosis not present

## 2018-08-02 DIAGNOSIS — J449 Chronic obstructive pulmonary disease, unspecified: Secondary | ICD-10-CM | POA: Diagnosis not present

## 2018-08-02 DIAGNOSIS — S0083XD Contusion of other part of head, subsequent encounter: Secondary | ICD-10-CM | POA: Diagnosis not present

## 2018-08-02 DIAGNOSIS — L03115 Cellulitis of right lower limb: Secondary | ICD-10-CM | POA: Diagnosis not present

## 2018-08-04 DIAGNOSIS — L03115 Cellulitis of right lower limb: Secondary | ICD-10-CM | POA: Diagnosis not present

## 2018-08-04 DIAGNOSIS — I480 Paroxysmal atrial fibrillation: Secondary | ICD-10-CM | POA: Diagnosis not present

## 2018-08-04 DIAGNOSIS — S0083XD Contusion of other part of head, subsequent encounter: Secondary | ICD-10-CM | POA: Diagnosis not present

## 2018-08-04 DIAGNOSIS — I5032 Chronic diastolic (congestive) heart failure: Secondary | ICD-10-CM | POA: Diagnosis not present

## 2018-08-04 DIAGNOSIS — J449 Chronic obstructive pulmonary disease, unspecified: Secondary | ICD-10-CM | POA: Diagnosis not present

## 2018-08-04 DIAGNOSIS — I872 Venous insufficiency (chronic) (peripheral): Secondary | ICD-10-CM | POA: Diagnosis not present

## 2018-08-05 DIAGNOSIS — J449 Chronic obstructive pulmonary disease, unspecified: Secondary | ICD-10-CM | POA: Diagnosis not present

## 2018-08-05 DIAGNOSIS — L03115 Cellulitis of right lower limb: Secondary | ICD-10-CM | POA: Diagnosis not present

## 2018-08-05 DIAGNOSIS — I872 Venous insufficiency (chronic) (peripheral): Secondary | ICD-10-CM | POA: Diagnosis not present

## 2018-08-05 DIAGNOSIS — S0083XD Contusion of other part of head, subsequent encounter: Secondary | ICD-10-CM | POA: Diagnosis not present

## 2018-08-05 DIAGNOSIS — I480 Paroxysmal atrial fibrillation: Secondary | ICD-10-CM | POA: Diagnosis not present

## 2018-08-05 DIAGNOSIS — I5032 Chronic diastolic (congestive) heart failure: Secondary | ICD-10-CM | POA: Diagnosis not present

## 2018-08-08 ENCOUNTER — Encounter (INDEPENDENT_AMBULATORY_CARE_PROVIDER_SITE_OTHER): Payer: Medicare Other | Admitting: Ophthalmology

## 2018-08-08 DIAGNOSIS — I872 Venous insufficiency (chronic) (peripheral): Secondary | ICD-10-CM | POA: Diagnosis not present

## 2018-08-08 DIAGNOSIS — J449 Chronic obstructive pulmonary disease, unspecified: Secondary | ICD-10-CM | POA: Diagnosis not present

## 2018-08-08 DIAGNOSIS — I480 Paroxysmal atrial fibrillation: Secondary | ICD-10-CM | POA: Diagnosis not present

## 2018-08-08 DIAGNOSIS — L03115 Cellulitis of right lower limb: Secondary | ICD-10-CM | POA: Diagnosis not present

## 2018-08-08 DIAGNOSIS — I5032 Chronic diastolic (congestive) heart failure: Secondary | ICD-10-CM | POA: Diagnosis not present

## 2018-08-08 DIAGNOSIS — S0083XD Contusion of other part of head, subsequent encounter: Secondary | ICD-10-CM | POA: Diagnosis not present

## 2018-08-09 DIAGNOSIS — J449 Chronic obstructive pulmonary disease, unspecified: Secondary | ICD-10-CM | POA: Diagnosis not present

## 2018-08-09 DIAGNOSIS — S0083XD Contusion of other part of head, subsequent encounter: Secondary | ICD-10-CM | POA: Diagnosis not present

## 2018-08-09 DIAGNOSIS — L03115 Cellulitis of right lower limb: Secondary | ICD-10-CM | POA: Diagnosis not present

## 2018-08-09 DIAGNOSIS — I480 Paroxysmal atrial fibrillation: Secondary | ICD-10-CM | POA: Diagnosis not present

## 2018-08-09 DIAGNOSIS — I872 Venous insufficiency (chronic) (peripheral): Secondary | ICD-10-CM | POA: Diagnosis not present

## 2018-08-09 DIAGNOSIS — I5032 Chronic diastolic (congestive) heart failure: Secondary | ICD-10-CM | POA: Diagnosis not present

## 2018-08-16 ENCOUNTER — Other Ambulatory Visit: Payer: Self-pay | Admitting: Cardiology

## 2018-08-16 ENCOUNTER — Ambulatory Visit: Payer: Medicare Other | Admitting: Cardiology

## 2018-08-17 ENCOUNTER — Ambulatory Visit (INDEPENDENT_AMBULATORY_CARE_PROVIDER_SITE_OTHER): Payer: Medicare Other | Admitting: Cardiology

## 2018-08-17 ENCOUNTER — Encounter: Payer: Self-pay | Admitting: Cardiology

## 2018-08-17 VITALS — BP 110/64 | HR 98 | Ht 67.0 in | Wt 114.0 lb

## 2018-08-17 DIAGNOSIS — I480 Paroxysmal atrial fibrillation: Secondary | ICD-10-CM | POA: Diagnosis not present

## 2018-08-17 DIAGNOSIS — S0083XD Contusion of other part of head, subsequent encounter: Secondary | ICD-10-CM | POA: Diagnosis not present

## 2018-08-17 DIAGNOSIS — W19XXXA Unspecified fall, initial encounter: Secondary | ICD-10-CM | POA: Diagnosis not present

## 2018-08-17 DIAGNOSIS — L03115 Cellulitis of right lower limb: Secondary | ICD-10-CM | POA: Diagnosis not present

## 2018-08-17 DIAGNOSIS — J441 Chronic obstructive pulmonary disease with (acute) exacerbation: Secondary | ICD-10-CM

## 2018-08-17 DIAGNOSIS — I4891 Unspecified atrial fibrillation: Secondary | ICD-10-CM | POA: Diagnosis not present

## 2018-08-17 DIAGNOSIS — I5032 Chronic diastolic (congestive) heart failure: Secondary | ICD-10-CM | POA: Diagnosis not present

## 2018-08-17 DIAGNOSIS — I872 Venous insufficiency (chronic) (peripheral): Secondary | ICD-10-CM | POA: Diagnosis not present

## 2018-08-17 DIAGNOSIS — Y92009 Unspecified place in unspecified non-institutional (private) residence as the place of occurrence of the external cause: Secondary | ICD-10-CM

## 2018-08-17 DIAGNOSIS — J449 Chronic obstructive pulmonary disease, unspecified: Secondary | ICD-10-CM | POA: Diagnosis not present

## 2018-08-17 NOTE — Patient Instructions (Addendum)
Medication Instructions:  Your physician recommends that you continue on your current medications as directed. Please refer to the Current Medication list given to you today.  If you need a refill on your cardiac medications before your next appointment, please call your pharmacy.   Lab work: Your physician recommends that you return for lab work today: CMP, CBC   If you have labs (blood work) drawn today and your tests are completely normal, you will receive your results only by: Marland Kitchen MyChart Message (if you have MyChart) OR . A paper copy in the mail If you have any lab test that is abnormal or we need to change your treatment, we will call you to review the results.  Testing/Procedures: None.   Follow-Up: At [facility], you and your health needs are our priority.  As part of our continuing mission to provide you with exceptional heart care, we have created designated Provider Care Teams.  These Care Teams include your primary Cardiologist (physician) and Advanced Practice Providers (APPs -  Physician Assistants and Nurse Practitioners) who all work together to provide you with the care you need, when you need it. You will need a follow up appointment in 3 months.  Please call our office 2 months in advance to schedule this appointment.  You may see W Tollie Eth, MD or another member of our [place] Provider Team in [place]: Shirlee More, MD . Jyl Heinz, MD  Any Other Special Instructions Will Be Listed Below (If Applicable).

## 2018-08-17 NOTE — Progress Notes (Signed)
Cardiology Office Note:    Date:  08/17/2018   ID:  [name], DOB 09-Jun-1927, MRN [id number]  PCP:  [name], MD  Cardiologist:  Jenne Campus, MD    Referring MD: [name], MD   Chief Complaint  Patient presents with  . 1 month follow up  She fell down and ended up being in a hospital  History of Present Illness:    [name] is a 85 y.o. female with complex past medical history.  Atrial fibrillation rate appears to be controlled she still anticoagulated but I still having some doubts about safety of this treatment because of frequent falls denies have any chest pain tightness squeezing pressure branches but complained of having pain in multiple portion of her body she ended up having some stitches into her face and those are healing  Past Medical History:  Diagnosis Date  . Anxiety   . Arthritis   . CHF (congestive heart failure) ([place])   . Complication of anesthesia    " I have to have a spinal beacuse my lungs & heart are bad "  . COPD (chronic obstructive pulmonary disease) ([place])   . Coronary artery disease   . Depression   . Fibromyalgia   . Hiatal hernia   . Hyperlipidemia   . Irregular heartbeat   . On home oxygen therapy    "2L at night and prn" (03/29/2018)  . Osteoporosis   . Peripheral neuropathy 08/30/2017  . Rotator cuff tear    RIGHT  . UTI (urinary tract infection) 09/2014    Past Surgical History:  Procedure Laterality Date  . APPENDECTOMY    . CATARACT EXTRACTION Left   . DILATION AND CURETTAGE OF UTERUS     x2  . ELBOW SURGERY     Right  . HIP ARTHROPLASTY Right 09/22/2014   Procedure: ARTHROPLASTY BIPOLAR HIP;  Surgeon: [name], MD;  Location: [place];  Service: Orthopedics;  Laterality: Right;  . TONSILLECTOMY    . TOTAL HIP REVISION Right 10/13/2014   Procedure: REVISION RIGHT TOTAL HIP POSTERIOR ;  Surgeon: Rod Can, MD;  Location: [facility];  Service: Orthopedics;  Laterality: Right;    Current  Medications: Current Meds  Medication Sig  . acetaminophen (TYLENOL) 500 MG tablet Take 2 tablets (1,000 mg total) by mouth every 8 (eight) hours. (Patient taking differently: Take 1,000 mg by mouth every 8 (eight) hours as needed for mild pain or headache. )  . albuterol (PROVENTIL HFA;VENTOLIN HFA) 108 (90 Base) MCG/ACT inhaler Inhale 2 puffs into the lungs every 6 (six) hours as needed. (Patient taking differently: Inhale 2 puffs into the lungs every 6 (six) hours as needed for wheezing or shortness of breath. )  . bisacodyl (DULCOLAX) 10 MG suppository Place 1 suppository (10 mg total) rectally daily as needed for moderate constipation.  . Cholecalciferol (VITAMIN D3) 1000 UNITS CAPS Take 1,000 Units by mouth daily.   . clorazepate (TRANXENE) 7.5 MG tablet Take 7.5 mg by mouth at bedtime.   Marland Kitchen diltiazem (CARDIZEM CD) 360 MG 24 hr capsule Take 1 capsule (360 mg total) by mouth daily.  . folic acid (FOLVITE) 220 MCG tablet Take 400 mcg by mouth daily.  . furosemide (LASIX) 20 MG tablet Take 2 tablets (40 mg total) by mouth daily.  Marland Kitchen gabapentin (NEURONTIN) 100 MG capsule Take 300 mg by mouth at bedtime.  . hydrocortisone (ANUSOL-HC) 25 MG suppository Place 25 mg rectally as needed for hemorrhoids.   Marland Kitchen  levothyroxine (SYNTHROID, LEVOTHROID) 75 MCG tablet Take 75 mcg by mouth daily.    . magnesium hydroxide (MILK OF MAGNESIA) 400 MG/5ML suspension Take 5-15 mLs by mouth daily as needed for mild constipation.   . Multiple Vitamins-Minerals (ICAPS AREDS 2) CAPS Take 1 capsule by mouth 2 (two) times daily.   . nitroGLYCERIN (NITROSTAT) 0.4 MG SL tablet Place 0.4 mg under the tongue every 5 (five) minutes as needed for chest pain.   Marland Kitchen ondansetron (ZOFRAN) 4 MG tablet TAKE 1 TABLET BY MOUTH EVERY 8 HOURS AS NEEDED FOR NAUSEA OR VOMITING (Patient taking differently: Take 4 mg by mouth every 8 (eight) hours as needed for nausea or vomiting. )  . OXYGEN Inhale 2 L into the lungs at bedtime. As instructed  .  Polyethyl Glycol-Propyl Glycol (SYSTANE) 0.4-0.3 % SOLN Place 1 drop into both eyes at bedtime as needed (for dry eyes).   . polyethylene glycol (MIRALAX / GLYCOLAX) packet Take 17 g by mouth 2 (two) times daily.  Marland Kitchen PROCTOZONE-HC 2.5 % rectal cream Place 1 application rectally 2 (two) times daily.   . sodium chloride HYPERTONIC 3 % nebulizer solution Take by nebulization 2 (two) times daily. (Patient taking differently: Take 4 mLs by nebulization 2 (two) times daily as needed for other or cough. )  . trimethoprim-polymyxin b (POLYTRIM) ophthalmic solution Place 1 drop into the left eye See admin instructions. Instill 1 drop into the left eye three times a day for the 1st day and 1 drop four times a day on the 2nd day AFTER INJECTION  . vitamin B-12 (CYANOCOBALAMIN) 1000 MCG tablet Take 1,000 mcg by mouth daily.     Allergies:   Shrimp [shellfish allergy]; Fish allergy; Adhesive [tape]; and Other   Social History   Socioeconomic History  . Marital status: Married    Spouse name: widowed  . Number of children: 1  . Years of education: 11  . Highest education level: Not on file  Occupational History  . Occupation: retired.  prev worked [employer]  . Financial resource strain: Not on file  . Food insecurity:    Worry: Not on file    Inability: Not on file  . Transportation needs:    Medical: Not on file    Non-medical: Not on file  Tobacco Use  . Smoking status: Never Smoker  . Smokeless tobacco: Never Used  Substance and Sexual Activity  . Alcohol use: No  . Drug use: No  . Sexual activity: Not on file  Lifestyle  . Physical activity:    Days per week: Not on file    Minutes per session: Not on file  . Stress: Not on file  Relationships  . Social connections:    Talks on phone: Not on file    Gets together: Not on file    Attends religious service: Not on file    Active member of club or organization: Not on file    Attends meetings of clubs or organizations:  Not on file    Relationship status: Not on file  Other Topics Concern  . Not on file  Social History Narrative   Lives with husand   Caffeine use: 1.5 cups coffee per day and pepsi   Right handed      Family History: The patient's family history includes Diabetes in her son; Heart attack in her brother, sister, and son; Heart failure in her mother; Hypertension in her brother and mother; Obesity in her son;  Pancreatic cancer in her father; Stroke in her brother. ROS:   Please see the history of present illness.    All 14 point review of systems negative except as described per history of present illness  EKGs/Labs/Other Studies Reviewed:      Recent Labs: 03/29/2018: B Natriuretic Peptide 604.9 04/06/2018: ALT 15; Magnesium 2.4 06/15/2018: NT-Pro BNP 2,504; TSH 3.380 07/01/2018: BUN 17; Creatinine, Ser 0.91; Hemoglobin 10.9; Platelets 185; Potassium 4.9; Sodium 140  Recent Lipid Panel No results found for: CHOL, TRIG, HDL, CHOLHDL, VLDL, LDLCALC, LDLDIRECT  Physical Exam:    VS:  BP 110/64   Pulse 98   Ht 5\' 7"  (1.702 m)   Wt 114 lb (51.7 kg)   SpO2 (!) 55%   BMI 17.85 kg/m     Wt Readings from Last 3 Encounters:  08/17/18 114 lb (51.7 kg)  07/01/18 112 lb 10.5 oz (51.1 kg)  06/25/18 110 lb (49.9 kg)     GEN:  Well nourished, well developed in no acute distress HEENT: Normal NECK: No JVD; No carotid bruits LYMPHATICS: No lymphadenopathy CARDIAC: RRR, no murmurs, no rubs, no gallops RESPIRATORY:  Clear to auscultation without rales, wheezing or rhonchi  ABDOMEN: Soft, non-tender, non-distended MUSCULOSKELETAL:  No edema; No deformity  SKIN: Warm and dry LOWER EXTREMITIES: no swelling NEUROLOGIC:  Alert and oriented x 3 PSYCHIATRIC:  Normal affect   ASSESSMENT:    1. Atrial fibrillation with RVR ([place])   2. COPD exacerbation ([place])   3. Fall in home, initial encounter    PLAN:    In order of problems listed above:  1. Atrial fibrillation doing well from  that point review we will continue present management 2. COPD stable 3. Followed home we discussed the measures that she can take to safely more move around her house she will have noticed coming to her house checking make sure everything is safe for her.  I see her back in my office in 3 months at that time we will decide if we can continue with anticoagulation.   Medication Adjustments/Labs and Tests Ordered: Current medicines are reviewed at length with the patient today.  Concerns regarding medicines are outlined above.  No orders of the defined types were placed in this encounter.  Medication changes: No orders of the defined types were placed in this encounter.   Signed, Park Liter, MD, [facility] 08/17/2018 4:19 PM    [place]

## 2018-08-18 LAB — COMPREHENSIVE METABOLIC PANEL
ALT: 12 IU/L (ref 0–32)
AST: 26 IU/L (ref 0–40)
Albumin/Globulin Ratio: 1.2 (ref 1.2–2.2)
Albumin: 4.1 g/dL (ref 3.5–4.7)
Alkaline Phosphatase: 117 IU/L (ref 39–117)
BILIRUBIN TOTAL: 0.3 mg/dL (ref 0.0–1.2)
BUN/Creatinine Ratio: 23 (ref 12–28)
BUN: 21 mg/dL (ref 8–27)
CHLORIDE: 97 mmol/L (ref 96–106)
CO2: 30 mmol/L — ABNORMAL HIGH (ref 20–29)
Calcium: 9 mg/dL (ref 8.7–10.3)
Creatinine, Ser: 0.91 mg/dL (ref 0.57–1.00)
GFR calc Af Amer: 65 mL/min/{1.73_m2} (ref >59)
GFR calc non Af Amer: 56 mL/min/{1.73_m2} — ABNORMAL LOW (ref >59)
GLUCOSE: 125 mg/dL — AB (ref 65–99)
Globulin, Total: 3.4 g/dL (ref 1.5–4.5)
Potassium: 4.8 mmol/L (ref 3.5–5.2)
Sodium: 140 mmol/L (ref 134–144)
Total Protein: 7.5 g/dL (ref 6.0–8.5)

## 2018-08-18 LAB — CBC
HEMATOCRIT: 35 % (ref 34.0–46.6)
Hemoglobin: 11.8 g/dL (ref 11.1–15.9)
MCH: 32.1 pg (ref 26.6–33.0)
MCHC: 33.7 g/dL (ref 31.5–35.7)
MCV: 95 fL (ref 79–97)
Platelets: 206 10*3/uL (ref 150–450)
RBC: 3.68 x10E6/uL — ABNORMAL LOW (ref 3.77–5.28)
RDW: 12.3 % (ref 11.7–15.4)
WBC: 6.6 10*3/uL (ref 3.4–10.8)

## 2018-08-22 DIAGNOSIS — I8310 Varicose veins of unspecified lower extremity with inflammation: Secondary | ICD-10-CM | POA: Diagnosis not present

## 2018-08-22 DIAGNOSIS — I509 Heart failure, unspecified: Secondary | ICD-10-CM | POA: Diagnosis not present

## 2018-08-22 DIAGNOSIS — L03115 Cellulitis of right lower limb: Secondary | ICD-10-CM | POA: Diagnosis not present

## 2018-08-23 DIAGNOSIS — I872 Venous insufficiency (chronic) (peripheral): Secondary | ICD-10-CM | POA: Diagnosis not present

## 2018-08-23 DIAGNOSIS — J449 Chronic obstructive pulmonary disease, unspecified: Secondary | ICD-10-CM | POA: Diagnosis not present

## 2018-08-23 DIAGNOSIS — I5032 Chronic diastolic (congestive) heart failure: Secondary | ICD-10-CM | POA: Diagnosis not present

## 2018-08-23 DIAGNOSIS — I480 Paroxysmal atrial fibrillation: Secondary | ICD-10-CM | POA: Diagnosis not present

## 2018-08-23 DIAGNOSIS — L03115 Cellulitis of right lower limb: Secondary | ICD-10-CM | POA: Diagnosis not present

## 2018-08-23 DIAGNOSIS — S0083XD Contusion of other part of head, subsequent encounter: Secondary | ICD-10-CM | POA: Diagnosis not present

## 2018-08-24 DIAGNOSIS — S0083XD Contusion of other part of head, subsequent encounter: Secondary | ICD-10-CM | POA: Diagnosis not present

## 2018-08-24 DIAGNOSIS — I872 Venous insufficiency (chronic) (peripheral): Secondary | ICD-10-CM | POA: Diagnosis not present

## 2018-08-24 DIAGNOSIS — J449 Chronic obstructive pulmonary disease, unspecified: Secondary | ICD-10-CM | POA: Diagnosis not present

## 2018-08-24 DIAGNOSIS — I480 Paroxysmal atrial fibrillation: Secondary | ICD-10-CM | POA: Diagnosis not present

## 2018-08-24 DIAGNOSIS — L03115 Cellulitis of right lower limb: Secondary | ICD-10-CM | POA: Diagnosis not present

## 2018-08-24 DIAGNOSIS — I5032 Chronic diastolic (congestive) heart failure: Secondary | ICD-10-CM | POA: Diagnosis not present

## 2018-08-30 ENCOUNTER — Ambulatory Visit: Payer: Medicare Other | Admitting: Cardiology

## 2018-09-24 ENCOUNTER — Other Ambulatory Visit: Payer: Self-pay | Admitting: Cardiology

## 2018-09-29 ENCOUNTER — Encounter (INDEPENDENT_AMBULATORY_CARE_PROVIDER_SITE_OTHER): Payer: Medicare Other | Admitting: Ophthalmology

## 2018-10-11 ENCOUNTER — Encounter (HOSPITAL_COMMUNITY): Payer: Self-pay | Admitting: Emergency Medicine

## 2018-10-11 ENCOUNTER — Other Ambulatory Visit: Payer: Self-pay

## 2018-10-11 ENCOUNTER — Inpatient Hospital Stay (HOSPITAL_COMMUNITY)
Admission: EM | Admit: 2018-10-11 | Discharge: 2018-10-20 | DRG: 202 | Disposition: A | Discharge status: Skilled Nursing Facility | Payer: Medicare Other | Attending: Internal Medicine | Admitting: Internal Medicine

## 2018-10-11 ENCOUNTER — Emergency Department (HOSPITAL_COMMUNITY): Payer: Medicare Other

## 2018-10-11 DIAGNOSIS — J441 Chronic obstructive pulmonary disease with (acute) exacerbation: Secondary | ICD-10-CM | POA: Diagnosis not present

## 2018-10-11 DIAGNOSIS — I10 Essential (primary) hypertension: Secondary | ICD-10-CM | POA: Diagnosis not present

## 2018-10-11 DIAGNOSIS — Z800 Family history of malignant neoplasm of digestive organs: Secondary | ICD-10-CM

## 2018-10-11 DIAGNOSIS — Z9981 Dependence on supplemental oxygen: Secondary | ICD-10-CM

## 2018-10-11 DIAGNOSIS — F419 Anxiety disorder, unspecified: Secondary | ICD-10-CM | POA: Diagnosis present

## 2018-10-11 DIAGNOSIS — Z91048 Other nonmedicinal substance allergy status: Secondary | ICD-10-CM

## 2018-10-11 DIAGNOSIS — J9621 Acute and chronic respiratory failure with hypoxia: Secondary | ICD-10-CM | POA: Diagnosis not present

## 2018-10-11 DIAGNOSIS — R0602 Shortness of breath: Secondary | ICD-10-CM

## 2018-10-11 DIAGNOSIS — R600 Localized edema: Secondary | ICD-10-CM

## 2018-10-11 DIAGNOSIS — L03116 Cellulitis of left lower limb: Secondary | ICD-10-CM | POA: Diagnosis present

## 2018-10-11 DIAGNOSIS — G9340 Encephalopathy, unspecified: Secondary | ICD-10-CM | POA: Diagnosis not present

## 2018-10-11 DIAGNOSIS — J9622 Acute and chronic respiratory failure with hypercapnia: Secondary | ICD-10-CM | POA: Diagnosis present

## 2018-10-11 DIAGNOSIS — Z8249 Family history of ischemic heart disease and other diseases of the circulatory system: Secondary | ICD-10-CM

## 2018-10-11 DIAGNOSIS — K449 Diaphragmatic hernia without obstruction or gangrene: Secondary | ICD-10-CM | POA: Diagnosis present

## 2018-10-11 DIAGNOSIS — J209 Acute bronchitis, unspecified: Secondary | ICD-10-CM | POA: Diagnosis present

## 2018-10-11 DIAGNOSIS — I5032 Chronic diastolic (congestive) heart failure: Secondary | ICD-10-CM | POA: Diagnosis present

## 2018-10-11 DIAGNOSIS — Z681 Body mass index (BMI) 19 or less, adult: Secondary | ICD-10-CM

## 2018-10-11 DIAGNOSIS — Z823 Family history of stroke: Secondary | ICD-10-CM

## 2018-10-11 DIAGNOSIS — Z833 Family history of diabetes mellitus: Secondary | ICD-10-CM

## 2018-10-11 DIAGNOSIS — E039 Hypothyroidism, unspecified: Secondary | ICD-10-CM | POA: Diagnosis present

## 2018-10-11 DIAGNOSIS — E874 Mixed disorder of acid-base balance: Secondary | ICD-10-CM | POA: Diagnosis not present

## 2018-10-11 DIAGNOSIS — L03119 Cellulitis of unspecified part of limb: Secondary | ICD-10-CM | POA: Diagnosis present

## 2018-10-11 DIAGNOSIS — J069 Acute upper respiratory infection, unspecified: Secondary | ICD-10-CM | POA: Diagnosis not present

## 2018-10-11 DIAGNOSIS — J9801 Acute bronchospasm: Secondary | ICD-10-CM | POA: Diagnosis not present

## 2018-10-11 DIAGNOSIS — I480 Paroxysmal atrial fibrillation: Secondary | ICD-10-CM | POA: Diagnosis present

## 2018-10-11 DIAGNOSIS — M797 Fibromyalgia: Secondary | ICD-10-CM | POA: Diagnosis present

## 2018-10-11 DIAGNOSIS — I4891 Unspecified atrial fibrillation: Secondary | ICD-10-CM | POA: Diagnosis not present

## 2018-10-11 DIAGNOSIS — J440 Chronic obstructive pulmonary disease with acute lower respiratory infection: Secondary | ICD-10-CM | POA: Diagnosis present

## 2018-10-11 DIAGNOSIS — G629 Polyneuropathy, unspecified: Secondary | ICD-10-CM | POA: Diagnosis present

## 2018-10-11 DIAGNOSIS — I2510 Atherosclerotic heart disease of native coronary artery without angina pectoris: Secondary | ICD-10-CM | POA: Diagnosis present

## 2018-10-11 DIAGNOSIS — Z66 Do not resuscitate: Secondary | ICD-10-CM | POA: Diagnosis present

## 2018-10-11 DIAGNOSIS — M810 Age-related osteoporosis without current pathological fracture: Secondary | ICD-10-CM | POA: Diagnosis present

## 2018-10-11 DIAGNOSIS — L03114 Cellulitis of left upper limb: Secondary | ICD-10-CM | POA: Diagnosis present

## 2018-10-11 DIAGNOSIS — Z91013 Allergy to seafood: Secondary | ICD-10-CM

## 2018-10-11 DIAGNOSIS — J90 Pleural effusion, not elsewhere classified: Secondary | ICD-10-CM | POA: Diagnosis not present

## 2018-10-11 DIAGNOSIS — M1990 Unspecified osteoarthritis, unspecified site: Secondary | ICD-10-CM | POA: Diagnosis present

## 2018-10-11 DIAGNOSIS — E785 Hyperlipidemia, unspecified: Secondary | ICD-10-CM | POA: Diagnosis present

## 2018-10-11 DIAGNOSIS — J449 Chronic obstructive pulmonary disease, unspecified: Secondary | ICD-10-CM | POA: Diagnosis present

## 2018-10-11 DIAGNOSIS — Z7901 Long term (current) use of anticoagulants: Secondary | ICD-10-CM

## 2018-10-11 DIAGNOSIS — I5033 Acute on chronic diastolic (congestive) heart failure: Secondary | ICD-10-CM | POA: Diagnosis not present

## 2018-10-11 DIAGNOSIS — L0390 Cellulitis, unspecified: Secondary | ICD-10-CM | POA: Diagnosis not present

## 2018-10-11 DIAGNOSIS — Z96641 Presence of right artificial hip joint: Secondary | ICD-10-CM | POA: Diagnosis present

## 2018-10-11 DIAGNOSIS — E440 Moderate protein-calorie malnutrition: Secondary | ICD-10-CM | POA: Diagnosis present

## 2018-10-11 DIAGNOSIS — R609 Edema, unspecified: Secondary | ICD-10-CM | POA: Diagnosis not present

## 2018-10-11 LAB — COMPREHENSIVE METABOLIC PANEL
ALT: 28 U/L (ref 0–44)
AST: 39 U/L (ref 15–41)
Albumin: 3.3 g/dL — ABNORMAL LOW (ref 3.5–5.0)
Alkaline Phosphatase: 97 U/L (ref 38–126)
Anion gap: 11 (ref 5–15)
BUN: 27 mg/dL — ABNORMAL HIGH (ref 8–23)
CHLORIDE: 94 mmol/L — AB (ref 98–111)
CO2: 29 mmol/L (ref 22–32)
Calcium: 8.5 mg/dL — ABNORMAL LOW (ref 8.9–10.3)
Creatinine, Ser: 0.92 mg/dL (ref 0.44–1.00)
GFR calc Af Amer: >60 mL/min (ref >60)
GFR calc non Af Amer: 55 mL/min — ABNORMAL LOW (ref >60)
Glucose, Bld: 111 mg/dL — ABNORMAL HIGH (ref 70–99)
Potassium: 4.1 mmol/L (ref 3.5–5.1)
Sodium: 134 mmol/L — ABNORMAL LOW (ref 135–145)
Total Bilirubin: 0.6 mg/dL (ref 0.3–1.2)
Total Protein: 7.6 g/dL (ref 6.5–8.1)

## 2018-10-11 LAB — CBC WITH DIFFERENTIAL/PLATELET
Abs Immature Granulocytes: 0.05 10*3/uL (ref 0.00–0.07)
Basophils Absolute: 0 10*3/uL (ref 0.0–0.1)
Basophils Relative: 1 %
Eosinophils Absolute: 0.2 10*3/uL (ref 0.0–0.5)
Eosinophils Relative: 2 %
HCT: 36.5 % (ref 36.0–46.0)
Hemoglobin: 11.7 g/dL — ABNORMAL LOW (ref 12.0–15.0)
Immature Granulocytes: 1 %
Lymphocytes Relative: 13 %
Lymphs Abs: 1.1 10*3/uL (ref 0.7–4.0)
MCH: 31.2 pg (ref 26.0–34.0)
MCHC: 32.1 g/dL (ref 30.0–36.0)
MCV: 97.3 fL (ref 80.0–100.0)
Monocytes Absolute: 1 10*3/uL (ref 0.1–1.0)
Monocytes Relative: 12 %
NEUTROS PCT: 71 %
NRBC: 0.2 % (ref 0.0–0.2)
Neutro Abs: 6.3 10*3/uL (ref 1.7–7.7)
Platelets: 330 10*3/uL (ref 150–400)
RBC: 3.75 MIL/uL — ABNORMAL LOW (ref 3.87–5.11)
RDW: 14 % (ref 11.5–15.5)
WBC: 8.6 10*3/uL (ref 4.0–10.5)

## 2018-10-11 LAB — INFLUENZA PANEL BY PCR (TYPE A & B)
INFLBPCR: NEGATIVE
Influenza A By PCR: NEGATIVE

## 2018-10-11 LAB — BRAIN NATRIURETIC PEPTIDE: B Natriuretic Peptide: 390 pg/mL — ABNORMAL HIGH (ref 0.0–100.0)

## 2018-10-11 LAB — TROPONIN I: Troponin I: <0.03 ng/mL (ref <0.03)

## 2018-10-11 MED ORDER — IPRATROPIUM-ALBUTEROL 0.5-2.5 (3) MG/3ML IN SOLN
3.0000 mL | Freq: Once | RESPIRATORY_TRACT | Status: AC
  Administered 2018-10-11: 3 mL via RESPIRATORY_TRACT
  Filled 2018-10-11: qty 3

## 2018-10-11 NOTE — ED Notes (Signed)
Notified x ray pt was ready for transport to x ray.

## 2018-10-11 NOTE — ED Notes (Signed)
Respiratory notified of Duoneb.

## 2018-10-11 NOTE — ED Provider Notes (Signed)
Emergency Department Provider Note   I have reviewed the triage vital signs and the nursing notes.   HISTORY  Chief Complaint Shortness of Breath   HPI [name] is a 85 y.o. female with PMH of COPD, CHF, HLD, and arthritis on 2L home O2 at baseline presents to the emergency department for evaluation of shortness of breath.  She has had worsening shortness of breath throughout the morning but according to EMS, family that symptoms have been worsening over the past 3 days.  Patient describes "pain all over" but denies specific chest pain.  No abdominal pain, vomiting, diarrhea.  No fevers.  Patient does not travel.  No sick contacts.  She has been trying her albuterol inhalers at home with some mild relief in symptoms. No radiation of symptoms or modifying factors.   Past Medical History:  Diagnosis Date  . Anxiety   . Arthritis   . CHF (congestive heart failure) ([place])   . Complication of anesthesia    " I have to have a spinal beacuse my lungs & heart are bad "  . COPD (chronic obstructive pulmonary disease) ([place])   . Coronary artery disease   . Depression   . Fibromyalgia   . Hiatal hernia   . Hyperlipidemia   . Irregular heartbeat   . On home oxygen therapy    "2L at night and prn" (03/29/2018)  . Osteoporosis   . Peripheral neuropathy 08/30/2017  . Rotator cuff tear    RIGHT  . UTI (urinary tract infection) 09/2014    Patient Active Problem List   Diagnosis Date Noted  . Cellulitis, leg 06/28/2018  . Chronic diastolic CHF (congestive heart failure) ([place]) 06/28/2018  . AF (paroxysmal atrial fibrillation) ([place]) 06/28/2018  . Facial laceration   . Poor appetite 05/04/2018  . Atrial fibrillation with RVR ([place]) 03/30/2018  . Dysphagia 03/30/2018  . Diastolic CHF ([place]) 64/40/3474  . Abdominal distension (gaseous) 01/20/2018  . Loss of weight 01/20/2018  . Nausea and vomiting 01/20/2018  . Constipation 01/20/2018  . Peripheral neuropathy 08/30/2017  .  Peri-prosthetic fracture of femur following total hip arthroplasty 10/13/2014  . Protein-calorie malnutrition, severe ([place]) 10/12/2014  . Periprosthetic fracture around internal prosthetic right hip joint ([place]) 10/12/2014  . COPD exacerbation ([place])   . Acute cystitis without hematuria   . Hypothyroidism   . Hip fracture ([place]) 09/21/2014  . Fall at home 09/21/2014  . Syncope and collapse 07/02/2014  . Osteoporosis 04/10/2013  . Vaginal atrophy 04/10/2013  . Female pelvic pain 04/10/2013  . Hemoptysis, unspecified 10/12/2011  . Bronchiectasis without acute exacerbation ([place]) 05/19/2011    Past Surgical History:  Procedure Laterality Date  . APPENDECTOMY    . CATARACT EXTRACTION Left   . DILATION AND CURETTAGE OF UTERUS     x2  . ELBOW SURGERY     Right  . HIP ARTHROPLASTY Right 09/22/2014   Procedure: ARTHROPLASTY BIPOLAR HIP;  Surgeon: [name], MD;  Location: [facility];  Service: Orthopedics;  Laterality: Right;  . TONSILLECTOMY    . TOTAL HIP REVISION Right 10/13/2014   Procedure: REVISION RIGHT TOTAL HIP POSTERIOR ;  Surgeon: Rod Can, MD;  Location: [place];  Service: Orthopedics;  Laterality: Right;    Allergies Shrimp [shellfish allergy]; Fish allergy; Adhesive [tape]; and Other  Family History  Problem Relation Age of Onset  . Pancreatic cancer Father   . Heart failure Mother   . Hypertension Mother   . Heart attack Brother   .  Hypertension Brother   . Stroke Brother   . Heart attack Sister   . Diabetes Son   . Obesity Son   . Heart attack Son     Social History Social History   Tobacco Use  . Smoking status: Never Smoker  . Smokeless tobacco: Never Used  Substance Use Topics  . Alcohol use: No  . Drug use: No    Review of Systems  Constitutional: No fever/chills. Positive body aches.  Eyes: No visual changes. ENT: No sore throat. Cardiovascular: Denies chest pain. Respiratory: Positive shortness of breath. Gastrointestinal: No abdominal  pain.  No nausea, no vomiting.  No diarrhea.  No constipation. Genitourinary: Negative for dysuria. Musculoskeletal: Negative for back pain. Skin: Negative for rash. Neurological: Negative for headaches, focal weakness or numbness.  10-point ROS otherwise negative.  ____________________________________________   PHYSICAL EXAM:  VITAL SIGNS: ED Triage Vitals  Enc Vitals Group     BP 10/11/18 2036 (!) 156/85     Pulse Rate 10/11/18 2036 88     Resp 10/11/18 2036 (!) 21     Temp 10/11/18 2036 98 F (36.7 C)     Temp Source 10/11/18 2036 Oral     SpO2 10/11/18 2033 97 %     Pain Score 10/11/18 2035 5   Constitutional: Alert and oriented. Well appearing and in no acute distress. Eyes: Conjunctivae are normal.  Head: Atraumatic. Nose: No congestion/rhinnorhea. Mouth/Throat: Mucous membranes are moist.  Neck: No stridor.   Cardiovascular: Normal rate, regular rhythm. Good peripheral circulation. Grossly normal heart sounds.   Respiratory: Normal respiratory effort.  No retractions. Lungs CTAB. Gastrointestinal: Soft and nontender. No distention.  Musculoskeletal: No lower extremity tenderness nor edema. No gross deformities of extremities. Neurologic:  Normal speech and language. No gross focal neurologic deficits are appreciated.  Skin:  Skin is warm, dry and intact. No rash noted.  ____________________________________________   LABS (all labs ordered are listed, but only abnormal results are displayed)  Labs Reviewed  COMPREHENSIVE METABOLIC PANEL - Abnormal; Notable for the following components:      Result Value   Sodium 134 (*)    Chloride 94 (*)    Glucose, Bld 111 (*)    BUN 27 (*)    Calcium 8.5 (*)    Albumin 3.3 (*)    GFR calc non Af Amer 55 (*)    All other components within normal limits  BRAIN NATRIURETIC PEPTIDE - Abnormal; Notable for the following components:   B Natriuretic Peptide 390.0 (*)    All other components within normal limits  CBC WITH  DIFFERENTIAL/PLATELET - Abnormal; Notable for the following components:   RBC 3.75 (*)    Hemoglobin 11.7 (*)    All other components within normal limits  INFLUENZA PANEL BY PCR (TYPE A & B)  TROPONIN I   ____________________________________________  EKG   EKG Interpretation  Date/Time:  Tuesday October 11 2018 20:49:18 EDT Ventricular Rate:  94 PR Interval:    QRS Duration: 103 QT Interval:  364 QTC Calculation: 456 R Axis:   0 Text Interpretation:  Atrial fibrillation Ventricular premature complex Probable anteroseptal infarct, old No STEMI.  Confirmed by Nanda Quinton [phone]) on 10/11/2018 9:08:44 PM       ____________________________________________  RADIOLOGY  Dg Chest 2 View  Result Date: 10/11/2018 CLINICAL DATA:  Worsening shortness of breath for 3 days. Pain all over. History of COPD and CHF. EXAM: CHEST - 2 VIEW COMPARISON:  03/29/2018 FINDINGS: Cardiac enlargement. Bilateral perihilar  airspace disease, likely representing edema. Increasing bilateral pleural effusions with basilar atelectasis or consolidation since previous study. Emphysematous changes in the lungs. No pneumothorax. Degenerative changes in the spine. IMPRESSION: Cardiac enlargement. Perihilar edema. Increasing bilateral pleural effusions and basilar atelectasis or consolidation since previous study. Electronically Signed   By: Lucienne Capers M.D.   On: 10/11/2018 22:13    ____________________________________________   PROCEDURES  Procedure(s) performed:   Procedures  None ____________________________________________   INITIAL IMPRESSION / ASSESSMENT AND PLAN / ED COURSE  Pertinent labs & imaging results that were available during my care of the patient were reviewed by me and considered in my medical decision making (see chart for details).  Patient presents to the emergency department with shortness of breath over the past 3 days worsening since this morning.  No significant work of  breathing on evaluation here.  She is speaking in full sentences and on her home oxygen.  No significant wheezing.  Plan for flu testing along with chest x-ray, labs.  EKG with no acute findings and similar to prior.  EKG and labs including flu negative. Patient feeling somewhat improved. CXR pending. If negative, will ambulate with pulse ox. Care transferred to Dr. Roderic Palau pending CXR.  ____________________________________________  FINAL CLINICAL IMPRESSION(S) / ED DIAGNOSES  Final diagnoses:  Acute URI  Bronchospasm     MEDICATIONS GIVEN DURING THIS VISIT:  Medications  ipratropium-albuterol (DUONEB) 0.5-2.5 (3) MG/3ML nebulizer solution 3 mL (3 mLs Nebulization Given 10/11/18 2118)    Note:  This document was prepared using Dragon voice recognition software and may include unintentional dictation errors.  Nanda Quinton, MD Emergency Medicine    Yassin Scales, [facility], MD 10/11/18 [phone]

## 2018-10-11 NOTE — ED Triage Notes (Signed)
Pt brought from home via [facility]. Pt C/O SOB that began getting worse the morning. EMS states family says she has "been worse for 3 days." Pt reports pain all over.

## 2018-10-11 NOTE — ED Notes (Signed)
Assisted pt to restroom. Pt required moderate amount of assistance.

## 2018-10-12 DIAGNOSIS — J440 Chronic obstructive pulmonary disease with acute lower respiratory infection: Secondary | ICD-10-CM | POA: Diagnosis present

## 2018-10-12 DIAGNOSIS — I5033 Acute on chronic diastolic (congestive) heart failure: Secondary | ICD-10-CM | POA: Diagnosis not present

## 2018-10-12 DIAGNOSIS — J9621 Acute and chronic respiratory failure with hypoxia: Secondary | ICD-10-CM | POA: Diagnosis present

## 2018-10-12 DIAGNOSIS — J069 Acute upper respiratory infection, unspecified: Secondary | ICD-10-CM

## 2018-10-12 DIAGNOSIS — E440 Moderate protein-calorie malnutrition: Secondary | ICD-10-CM | POA: Diagnosis present

## 2018-10-12 DIAGNOSIS — J9601 Acute respiratory failure with hypoxia: Secondary | ICD-10-CM | POA: Diagnosis not present

## 2018-10-12 DIAGNOSIS — F411 Generalized anxiety disorder: Secondary | ICD-10-CM | POA: Diagnosis not present

## 2018-10-12 DIAGNOSIS — M1990 Unspecified osteoarthritis, unspecified site: Secondary | ICD-10-CM | POA: Diagnosis present

## 2018-10-12 DIAGNOSIS — M797 Fibromyalgia: Secondary | ICD-10-CM | POA: Diagnosis present

## 2018-10-12 DIAGNOSIS — R0602 Shortness of breath: Secondary | ICD-10-CM | POA: Diagnosis not present

## 2018-10-12 DIAGNOSIS — E039 Hypothyroidism, unspecified: Secondary | ICD-10-CM | POA: Diagnosis present

## 2018-10-12 DIAGNOSIS — F329 Major depressive disorder, single episode, unspecified: Secondary | ICD-10-CM | POA: Diagnosis not present

## 2018-10-12 DIAGNOSIS — I480 Paroxysmal atrial fibrillation: Secondary | ICD-10-CM | POA: Diagnosis present

## 2018-10-12 DIAGNOSIS — J209 Acute bronchitis, unspecified: Secondary | ICD-10-CM | POA: Diagnosis present

## 2018-10-12 DIAGNOSIS — F5105 Insomnia due to other mental disorder: Secondary | ICD-10-CM | POA: Diagnosis not present

## 2018-10-12 DIAGNOSIS — Z741 Need for assistance with personal care: Secondary | ICD-10-CM | POA: Diagnosis not present

## 2018-10-12 DIAGNOSIS — I361 Nonrheumatic tricuspid (valve) insufficiency: Secondary | ICD-10-CM | POA: Diagnosis not present

## 2018-10-12 DIAGNOSIS — L03116 Cellulitis of left lower limb: Secondary | ICD-10-CM | POA: Diagnosis present

## 2018-10-12 DIAGNOSIS — G9340 Encephalopathy, unspecified: Secondary | ICD-10-CM | POA: Diagnosis not present

## 2018-10-12 DIAGNOSIS — J449 Chronic obstructive pulmonary disease, unspecified: Secondary | ICD-10-CM | POA: Diagnosis not present

## 2018-10-12 DIAGNOSIS — J9622 Acute and chronic respiratory failure with hypercapnia: Secondary | ICD-10-CM | POA: Diagnosis present

## 2018-10-12 DIAGNOSIS — M810 Age-related osteoporosis without current pathological fracture: Secondary | ICD-10-CM | POA: Diagnosis present

## 2018-10-12 DIAGNOSIS — J40 Bronchitis, not specified as acute or chronic: Secondary | ICD-10-CM | POA: Diagnosis not present

## 2018-10-12 DIAGNOSIS — J9801 Acute bronchospasm: Secondary | ICD-10-CM | POA: Diagnosis not present

## 2018-10-12 DIAGNOSIS — I340 Nonrheumatic mitral (valve) insufficiency: Secondary | ICD-10-CM | POA: Diagnosis not present

## 2018-10-12 DIAGNOSIS — E785 Hyperlipidemia, unspecified: Secondary | ICD-10-CM | POA: Diagnosis present

## 2018-10-12 DIAGNOSIS — L03114 Cellulitis of left upper limb: Secondary | ICD-10-CM | POA: Diagnosis present

## 2018-10-12 DIAGNOSIS — Z681 Body mass index (BMI) 19 or less, adult: Secondary | ICD-10-CM | POA: Diagnosis not present

## 2018-10-12 DIAGNOSIS — D510 Vitamin B12 deficiency anemia due to intrinsic factor deficiency: Secondary | ICD-10-CM | POA: Diagnosis not present

## 2018-10-12 DIAGNOSIS — J441 Chronic obstructive pulmonary disease with (acute) exacerbation: Secondary | ICD-10-CM | POA: Diagnosis present

## 2018-10-12 DIAGNOSIS — M159 Polyosteoarthritis, unspecified: Secondary | ICD-10-CM | POA: Diagnosis not present

## 2018-10-12 DIAGNOSIS — I2510 Atherosclerotic heart disease of native coronary artery without angina pectoris: Secondary | ICD-10-CM | POA: Diagnosis present

## 2018-10-12 DIAGNOSIS — Z96641 Presence of right artificial hip joint: Secondary | ICD-10-CM | POA: Diagnosis present

## 2018-10-12 DIAGNOSIS — R600 Localized edema: Secondary | ICD-10-CM | POA: Diagnosis not present

## 2018-10-12 DIAGNOSIS — R41841 Cognitive communication deficit: Secondary | ICD-10-CM | POA: Diagnosis not present

## 2018-10-12 DIAGNOSIS — Z7901 Long term (current) use of anticoagulants: Secondary | ICD-10-CM | POA: Diagnosis not present

## 2018-10-12 DIAGNOSIS — L03119 Cellulitis of unspecified part of limb: Secondary | ICD-10-CM | POA: Diagnosis not present

## 2018-10-12 DIAGNOSIS — K449 Diaphragmatic hernia without obstruction or gangrene: Secondary | ICD-10-CM | POA: Diagnosis present

## 2018-10-12 DIAGNOSIS — R262 Difficulty in walking, not elsewhere classified: Secondary | ICD-10-CM | POA: Diagnosis not present

## 2018-10-12 DIAGNOSIS — E874 Mixed disorder of acid-base balance: Secondary | ICD-10-CM | POA: Diagnosis not present

## 2018-10-12 DIAGNOSIS — G629 Polyneuropathy, unspecified: Secondary | ICD-10-CM | POA: Diagnosis present

## 2018-10-12 DIAGNOSIS — E873 Alkalosis: Secondary | ICD-10-CM | POA: Diagnosis not present

## 2018-10-12 DIAGNOSIS — Z9981 Dependence on supplemental oxygen: Secondary | ICD-10-CM | POA: Diagnosis not present

## 2018-10-12 DIAGNOSIS — M6281 Muscle weakness (generalized): Secondary | ICD-10-CM | POA: Diagnosis not present

## 2018-10-12 LAB — COMPREHENSIVE METABOLIC PANEL
ALBUMIN: 2.9 g/dL — AB (ref 3.5–5.0)
ALT: 24 U/L (ref 0–44)
AST: 29 U/L (ref 15–41)
Alkaline Phosphatase: 84 U/L (ref 38–126)
Anion gap: 9 (ref 5–15)
BUN: 23 mg/dL (ref 8–23)
CHLORIDE: 99 mmol/L (ref 98–111)
CO2: 29 mmol/L (ref 22–32)
Calcium: 8.1 mg/dL — ABNORMAL LOW (ref 8.9–10.3)
Creatinine, Ser: 0.81 mg/dL (ref 0.44–1.00)
GFR calc Af Amer: >60 mL/min (ref >60)
GFR calc non Af Amer: >60 mL/min (ref >60)
GLUCOSE: 87 mg/dL (ref 70–99)
Potassium: 3.7 mmol/L (ref 3.5–5.1)
Sodium: 137 mmol/L (ref 135–145)
Total Bilirubin: 0.5 mg/dL (ref 0.3–1.2)
Total Protein: 6.7 g/dL (ref 6.5–8.1)

## 2018-10-12 LAB — CBC
HCT: 36.1 % (ref 36.0–46.0)
Hemoglobin: 11.3 g/dL — ABNORMAL LOW (ref 12.0–15.0)
MCH: 30.7 pg (ref 26.0–34.0)
MCHC: 31.3 g/dL (ref 30.0–36.0)
MCV: 98.1 fL (ref 80.0–100.0)
Platelets: 257 10*3/uL (ref 150–400)
RBC: 3.68 MIL/uL — AB (ref 3.87–5.11)
RDW: 13.8 % (ref 11.5–15.5)
WBC: 7.1 10*3/uL (ref 4.0–10.5)
nRBC: 0 % (ref 0.0–0.2)

## 2018-10-12 MED ORDER — SODIUM CHLORIDE 0.9 % IV SOLN
100.0000 mg | Freq: Two times a day (BID) | INTRAVENOUS | Status: DC
  Administered 2018-10-12 – 2018-10-13 (×3): 100 mg via INTRAVENOUS
  Filled 2018-10-12 (×6): qty 100

## 2018-10-12 MED ORDER — ALBUTEROL SULFATE (2.5 MG/3ML) 0.083% IN NEBU: 2.5000 mg | INHALATION_SOLUTION | Freq: Four times a day (QID) | RESPIRATORY_TRACT | Status: DC

## 2018-10-12 MED ORDER — APIXABAN 2.5 MG PO TABS
2.5000 mg | ORAL_TABLET | Freq: Two times a day (BID) | ORAL | Status: DC
  Administered 2018-10-12 – 2018-10-20 (×18): 2.5 mg via ORAL
  Filled 2018-10-12 (×18): qty 1

## 2018-10-12 MED ORDER — ACETAMINOPHEN 325 MG PO TABS
650.0000 mg | ORAL_TABLET | Freq: Four times a day (QID) | ORAL | Status: DC | PRN
  Administered 2018-10-14 – 2018-10-17 (×5): 650 mg via ORAL
  Filled 2018-10-12 (×5): qty 2

## 2018-10-12 MED ORDER — LEVOTHYROXINE SODIUM 75 MCG PO TABS
75.0000 ug | ORAL_TABLET | Freq: Every day | ORAL | Status: DC
  Administered 2018-10-12 – 2018-10-20 (×7): 75 ug via ORAL
  Filled 2018-10-12 (×8): qty 1

## 2018-10-12 MED ORDER — ALBUTEROL SULFATE (2.5 MG/3ML) 0.083% IN NEBU: 2.5000 mg | INHALATION_SOLUTION | RESPIRATORY_TRACT | Status: DC | PRN

## 2018-10-12 MED ORDER — GABAPENTIN 100 MG PO CAPS
200.0000 mg | ORAL_CAPSULE | Freq: Every day | ORAL | Status: DC
  Administered 2018-10-12 – 2018-10-19 (×8): 200 mg via ORAL
  Filled 2018-10-12 (×8): qty 2

## 2018-10-12 MED ORDER — ONDANSETRON HCL 4 MG/2ML IJ SOLN
4.0000 mg | Freq: Four times a day (QID) | INTRAMUSCULAR | Status: DC | PRN
  Administered 2018-10-12 – 2018-10-14 (×2): 4 mg via INTRAVENOUS
  Filled 2018-10-12 (×2): qty 2

## 2018-10-12 MED ORDER — IPRATROPIUM-ALBUTEROL 0.5-2.5 (3) MG/3ML IN SOLN
3.0000 mL | Freq: Four times a day (QID) | RESPIRATORY_TRACT | Status: DC
  Administered 2018-10-12 (×3): 3 mL via RESPIRATORY_TRACT
  Filled 2018-10-12 (×3): qty 3

## 2018-10-12 MED ORDER — ONDANSETRON HCL 4 MG PO TABS: 4.0000 mg | ORAL_TABLET | Freq: Four times a day (QID) | ORAL | Status: DC | PRN

## 2018-10-12 MED ORDER — IPRATROPIUM-ALBUTEROL 0.5-2.5 (3) MG/3ML IN SOLN
3.0000 mL | Freq: Four times a day (QID) | RESPIRATORY_TRACT | Status: DC
  Administered 2018-10-13 – 2018-10-20 (×22): 3 mL via RESPIRATORY_TRACT
  Filled 2018-10-12 (×22): qty 3

## 2018-10-12 MED ORDER — SODIUM CHLORIDE 3 % IN NEBU: 4.0000 mL | INHALATION_SOLUTION | Freq: Two times a day (BID) | RESPIRATORY_TRACT | Status: AC | PRN

## 2018-10-12 MED ORDER — FUROSEMIDE 20 MG PO TABS
20.0000 mg | ORAL_TABLET | Freq: Every day | ORAL | Status: DC
  Administered 2018-10-12 – 2018-10-13 (×2): 20 mg via ORAL
  Filled 2018-10-12 (×2): qty 1

## 2018-10-12 MED ORDER — GUAIFENESIN ER 600 MG PO TB12
600.0000 mg | ORAL_TABLET | Freq: Two times a day (BID) | ORAL | Status: DC
  Administered 2018-10-12 – 2018-10-20 (×18): 600 mg via ORAL
  Filled 2018-10-12 (×18): qty 1

## 2018-10-12 MED ORDER — NITROGLYCERIN 0.4 MG SL SUBL: 0.4000 mg | SUBLINGUAL_TABLET | SUBLINGUAL | Status: DC | PRN

## 2018-10-12 MED ORDER — ACETAMINOPHEN 650 MG RE SUPP: 650.0000 mg | Freq: Four times a day (QID) | RECTAL | Status: DC | PRN

## 2018-10-12 MED ORDER — CLORAZEPATE DIPOTASSIUM 7.5 MG PO TABS
7.5000 mg | ORAL_TABLET | Freq: Every day | ORAL | Status: DC
  Administered 2018-10-12 – 2018-10-19 (×8): 7.5 mg via ORAL
  Filled 2018-10-12 (×8): qty 1

## 2018-10-12 MED ORDER — DILTIAZEM HCL ER COATED BEADS 180 MG PO CP24
360.0000 mg | ORAL_CAPSULE | Freq: Every day | ORAL | Status: DC
  Administered 2018-10-12 – 2018-10-20 (×9): 360 mg via ORAL
  Filled 2018-10-12 (×9): qty 2

## 2018-10-12 MED ORDER — SODIUM CHLORIDE 0.9 % IV SOLN
INTRAVENOUS | Status: DC
  Administered 2018-10-12: 04:00:00 via INTRAVENOUS

## 2018-10-12 MED ORDER — ENSURE ENLIVE PO LIQD
237.0000 mL | Freq: Two times a day (BID) | ORAL | Status: DC
  Administered 2018-10-12 – 2018-10-19 (×14): 237 mL via ORAL

## 2018-10-12 MED ORDER — CLONAZEPAM 0.5 MG PO TABS: 0.5000 mg | ORAL_TABLET | Freq: Two times a day (BID) | ORAL | Status: DC | PRN

## 2018-10-12 MED ORDER — PRO-STAT SUGAR FREE PO LIQD
30.0000 mL | Freq: Two times a day (BID) | ORAL | Status: DC
  Administered 2018-10-12 – 2018-10-19 (×8): 30 mL via ORAL
  Filled 2018-10-12 (×13): qty 30

## 2018-10-12 MED ORDER — IPRATROPIUM BROMIDE 0.02 % IN SOLN: 0.5000 mg | Freq: Four times a day (QID) | RESPIRATORY_TRACT | Status: DC

## 2018-10-12 NOTE — Evaluation (Signed)
Physical Therapy Evaluation Patient Details Name: [name] MRN: [id number] DOB: 07/13/1927 Today's Date: 10/12/2018   History of Present Illness  [name]  is a 85 y.o. female, with history of paroxysmal atrial fibrillation on anticoagulation with Eliquis, diastolic CHF, COPD, coronary disease, pernicious anemia, chronic respiratory failure on 2 L oxygen at home, came to hospital with worsening shortness of breath.  As per family symptoms have been getting worse for past 3 days.  Patient said that she is coughing up yellow phlegm which is very thick.    Clinical Impression  Patient functioning near baseline for functional mobility and gait, slightly labored movement for sitting up at bedside, transferring to [facility] and walking in room.  Patient mostly limited for ambulation due to fatigue and did not want to go into hallway due to fear of catching the flu.  Patient tolerated sitting up at bedside with RN present in room.  Patient will benefit from continued physical therapy in hospital and recommended venue below to increase strength, balance, endurance for safe ADLs and gait.     Follow Up Recommendations Home health PT;Supervision - Intermittent    Equipment Recommendations  None recommended by PT    Recommendations for Other Services       Precautions / Restrictions Precautions Precautions: Fall Restrictions Weight Bearing Restrictions: No      Mobility  Bed Mobility Overal bed mobility: Needs Assistance Bed Mobility: Supine to Sit;Sit to Supine     Supine to sit: Supervision Sit to supine: Supervision   General bed mobility comments: slightly labored  Transfers Overall transfer level: Needs assistance Equipment used: Rolling walker (2 wheeled) Transfers: Sit to/from [facility] Sit to Stand: Supervision Stand pivot transfers: Min guard       General transfer comment: good return for transferring to  [facility]  Ambulation/Gait Ambulation/Gait assistance: Min guard Gait Distance (Feet): 30 Feet Assistive device: Rolling walker (2 wheeled) Gait Pattern/deviations: Decreased step length - right;Decreased step length - left;Decreased stride length Gait velocity: decreased   General Gait Details: slightly labored slow cadence without loss of balance, on 3 LPM, limited mostly due to c/o fatigue  Stairs            Wheelchair Mobility    Modified Rankin (Stroke Patients Only)       Balance Overall balance assessment: Needs assistance Sitting-balance support: Feet supported;No upper extremity supported Sitting balance-Leahy Scale: Good     Standing balance support: Bilateral upper extremity supported Standing balance-Leahy Scale: Fair Standing balance comment: using RW                             Pertinent Vitals/Pain Pain Assessment: No/denies pain    Home Living Family/patient expects to be discharged to:: Private residence Living Arrangements: Spouse/significant other Available Help at Discharge: Family Type of Home: House Home Access: Level entry     Home Layout: One level Home Equipment: Environmental consultant - 2 wheels;Walker - 4 wheels;Cane - single point;Wheelchair - manual;Hospital bed;Bedside commode      Prior Function Level of Independence: Independent with assistive device(s)         Comments: household ambulator with RW or rollator, requires assistance for longer distances using RW or Rollator     Hand Dominance        Extremity/Trunk Assessment   Upper Extremity Assessment Upper Extremity Assessment: Overall WFL for tasks assessed    Lower Extremity Assessment Lower Extremity Assessment: Generalized weakness  Cervical / Trunk Assessment Cervical / Trunk Assessment: Kyphotic  Communication   Communication: No difficulties  Cognition Arousal/Alertness: Awake/alert Behavior During Therapy: WFL for tasks assessed/performed Overall  Cognitive Status: Within Functional Limits for tasks assessed                                        General Comments      Exercises     Assessment/Plan    PT Assessment Patient needs continued PT services  PT Problem List Decreased strength;Decreased activity tolerance;Decreased balance;Decreased mobility       PT Treatment Interventions Therapeutic exercise;Gait training;Stair training;Functional mobility training;Therapeutic activities;Patient/family education    PT Goals (Current goals can be found in the Care Plan section)  Acute Rehab PT Goals Patient Stated Goal: return home PT Goal Formulation: With patient/family Time For Goal Achievement: 10/19/18 Potential to Achieve Goals: Good    Frequency Min 3X/week   Barriers to discharge        Co-evaluation               AM-PAC PT "6 Clicks" Mobility  Outcome Measure Help needed turning from your back to your side while in a flat bed without using bedrails?: None Help needed moving from lying on your back to sitting on the side of a flat bed without using bedrails?: None Help needed moving to and from a bed to a chair (including a wheelchair)?: None Help needed standing up from a chair using your arms (e.g., wheelchair or bedside chair)?: A Little Help needed to walk in hospital room?: A Little Help needed climbing 3-5 steps with a railing? : A Little 6 Click Score: 21    End of Session Equipment Utilized During Treatment: Oxygen Activity Tolerance: Patient tolerated treatment well;Patient limited by fatigue Patient left: in bed;with call bell/phone within reach;with family/visitor present;with nursing/sitter in room(seated at bedside) Nurse Communication: Mobility status PT Visit Diagnosis: Unsteadiness on feet (R26.81);Other abnormalities of gait and mobility (R26.89);Muscle weakness (generalized) (M62.81)    Time: 5520-8022 PT Time Calculation (min) (ACUTE ONLY): 31 min   Charges:   PT  Evaluation $PT Eval Moderate Complexity: 1 Mod PT Treatments $Therapeutic Activity: 23-37 mins        3:50 PM, 10/12/18 Lonell Grandchild, MPT Physical Therapist with [facility] 336 [phone] office [phone] mobile phone

## 2018-10-12 NOTE — Progress Notes (Addendum)
Initial Nutrition Assessment  DOCUMENTATION CODES:  Underweight, Non-severe (moderate) malnutrition in context of social or environmental circumstances  INTERVENTION:  Continue Ensure Enlive po BID, each supplement provides 350 kcal and 20 grams of protein  Took food/meal preferences to help promote intake.   Given her cellulitis and borderline meal intake, Will add Prostat BID to ensure she is meeting her protein needs.  NUTRITION DIAGNOSIS:  Moderate Malnutrition related to social / environmental circumstances(RD suspicious that pt lacks sufficient support at home) as evidenced by severe muscle loss and moderate (bordering on severe) fat loss  GOAL:  Patient will meet greater than or equal to 90% of their needs  MONITOR:  PO intake, Supplement acceptance, Skin, Weight trends, Labs, I & O's  REASON FOR ASSESSMENT:  Malnutrition Screening Tool    ASSESSMENT:  85 y/o female PMHx afib, CHF, COPD, CAD, Pernicious anemia, and COPD/ Chronic Resp Failure (2L baseline). Presented to hospital w/ worsening SOB and productive cough x3 days. CXR negative. Pt admitted for acute bronchitis/bronciectatsis. Was also inadvertently found to have cellulitis to several extremities.   Pt seen in bed with her spouse at bedside. Pt reports she and her spouse live alone. Pt is usually able to perform all ADLs independently.   Despite her acute illness, pt denies having any significant change in her appetite recently. She typically eats 2-3 times a day. She cannot give any examples of meals and says she will just prepare "what I feel like eating at the time". She says she does follow a low salt diet. She takes vitamins, but cannot say which ones specifically. She does drink oral supplements-glucerna. She says she is "doing good If I can get 2 in a day".   She denies having any recent n/v/d. She says she has chronic constipation and manages this with MOM and metamucil, both prn. She says this hasnt been an  issue in the hospital though.   Weight wise, she says her UBW is 129, but she had fallen to <100 lbs after a recent admission to [facility]. She says she has been able to regain some of the weight and believes she is currently 111 lbs. Per chart, she was admiited to [facility] at end of August and was 103.8 lbs on D/C. It does appear as though she regained some weight and was 114 lbs at a recent OP cardiology appt. Her current bed weight is 133 lbs, but she demonstrated mod-severe BLE edema.   At this time, pt reports a fair appetite. SHe says she ate well at breakfast, but not so well for lunch. Per chart, she has been eating 50-65% of her meals. She has an Ensure at bedside and wants to continue with these. RD took some food requests/prefs to help guide her meal selections. She voices concern about needing to D/c tomorrow, as she wont have a ride if she goes on Friday.   Labs: Albumin:3.3-> 2.9,  Otherwise unremarkable Meds: Ensure Enlive, Lasix, IVF, IV abx, prn antiemetics.  NUTRITION - FOCUSED PHYSICAL EXAM:   Most Recent Value  Orbital Region  Moderate depletion  Upper Arm Region  Moderate depletion  Thoracic and Lumbar Region  Severe depletion  Buccal Region  Mild depletion  Temple Region  Severe depletion  Clavicle Bone Region  Moderate depletion  Clavicle and Acromion Bone Region  Severe depletion  Scapular Bone Region  Severe depletion  Dorsal Hand  Mild depletion  Patellar Region  Unable to assess  Anterior Thigh Region  Unable to assess  Posterior Calf Region  Unable to assess  Edema (RD Assessment)  None  Hair  Reviewed  Eyes  Reviewed  Mouth  Reviewed  Skin  Reviewed  Nails  Reviewed     Diet Order:   Diet Order            Diet Heart Room service appropriate? Yes; Fluid consistency: Thin  Diet effective now             EDUCATION NEEDS:  Not appropriate for education at this time  Skin: Cellulitis to BLE   Last BM:  pt reports having had 2 bms today  Height:  Ht  Readings from Last 1 Encounters:  10/12/18 5\' 7"  (1.702 m)   Weight: Wt Readings from Last 1 Encounters:  10/12/18 56.7 kg   Wt Readings from Last 10 Encounters:  10/12/18 56.7 kg  08/17/18 51.7 kg  07/01/18 51.1 kg  06/25/18 49.9 kg  06/15/18 50 kg  04/27/18 49 kg  04/19/18 50.8 kg  04/06/18 49.8 kg  03/16/18 45.4 kg  01/20/18 53.2 kg   Ideal Body Weight:  61.36 kg  BMI:  Body mass index using dry wt is 17.9 kg/m.  114 lbs (51.82 kg) dosing weight  Estimated Nutritional Needs:  Kcal:  1550-1750 kcals (30-34 (30-34 kcal/kg bw) Protein:  73-83g Pro (1.4-1.6 g/kg bw) Fluid:  1.3-1.6 L fluid (25-30 ml/kg bw)  Burtis Junes RD, LDN, CNSC Clinical Nutrition Available Tues-Sat via Pager: [phone] 10/12/2018 4:26 PM

## 2018-10-12 NOTE — Plan of Care (Signed)
  Problem: Acute Rehab PT Goals(only PT should resolve) Goal: Pt Will Go Supine/Side To Sit Outcome: Progressing Flowsheets (Taken 10/12/2018 1552) Pt will go Supine/Side to Sit: with modified independence Goal: Patient Will Transfer Sit To/From Stand Outcome: Progressing Flowsheets (Taken 10/12/2018 1552) Patient will transfer sit to/from stand: with modified independence Goal: Pt Will Transfer Bed To Chair/Chair To Bed Outcome: Progressing Flowsheets (Taken 10/12/2018 1552) Pt will Transfer Bed to Chair/Chair to Bed: with supervision Goal: Pt Will Ambulate Outcome: Progressing Flowsheets (Taken 10/12/2018 1552) Pt will Ambulate: 50 feet; with supervision; with rolling walker   3:52 PM, 10/12/18 Lonell Grandchild, MPT Physical Therapist with [facility] [phone] office [phone] mobile phone

## 2018-10-12 NOTE — H&P (Signed)
TRH H&P    Patient Demographics:    [name], is a 85 y.o. female  MRN: [id number]  DOB - [birth year]  Admit Date - 10/11/2018  Referring MD/NP/PA: Dr. Roderic Palau  Outpatient Primary MD for the patient is [name], MD  Patient coming from: Home  Chief complaint-shortness of breath   HPI:    [name]  is a 85 y.o. female, with history of paroxysmal atrial fibrillation on anticoagulation with Eliquis, diastolic CHF, COPD, coronary disease, pernicious anemia, chronic respiratory failure on 2 L oxygen at home, came to hospital with worsening shortness of breath.  As per family symptoms have been getting worse for past 3 days.  Patient said that she is coughing up yellow phlegm which is very thick. Denies chest pain. Denies nausea vomiting or diarrhea. Denies abdominal pain, dysuria.  In the ED chest x-ray showed no infiltrate. Patient requiring 3 L/min of oxygen, she was given DuoNeb nebulizer, influenza panel negative.   Review of systems:    In addition to the HPI above,   All other systems reviewed and are negative.    Past History of the following :    Past Medical History:  Diagnosis Date  . Anxiety   . Arthritis   . CHF (congestive heart failure) ([place])   . Complication of anesthesia    " I have to have a spinal beacuse my lungs & heart are bad "  . COPD (chronic obstructive pulmonary disease) ([place])   . Coronary artery disease   . Depression   . Fibromyalgia   . Hiatal hernia   . Hyperlipidemia   . Irregular heartbeat   . On home oxygen therapy    "2L at night and prn" (03/29/2018)  . Osteoporosis   . Peripheral neuropathy 08/30/2017  . Rotator cuff tear    RIGHT  . UTI (urinary tract infection) 09/2014      Past Surgical History:  Procedure Laterality Date  . APPENDECTOMY    . CATARACT EXTRACTION Left   . DILATION AND CURETTAGE OF UTERUS     x2  . ELBOW SURGERY      Right  . HIP ARTHROPLASTY Right 09/22/2014   Procedure: ARTHROPLASTY BIPOLAR HIP;  Surgeon: [name], MD;  Location: [place];  Service: Orthopedics;  Laterality: Right;  . TONSILLECTOMY    . TOTAL HIP REVISION Right 10/13/2014   Procedure: REVISION RIGHT TOTAL HIP POSTERIOR ;  Surgeon: Rod Can, MD;  Location: [place];  Service: Orthopedics;  Laterality: Right;      Social History:      Social History   Tobacco Use  . Smoking status: Never Smoker  . Smokeless tobacco: Never Used  Substance Use Topics  . Alcohol use: No       Family History :     Family History  Problem Relation Age of Onset  . Pancreatic cancer Father   . Heart failure Mother   . Hypertension Mother   . Heart attack Brother   . Hypertension Brother   . Stroke Brother   .  Heart attack Sister   . Diabetes Son   . Obesity Son   . Heart attack Son       Home Medications:   Prior to Admission medications   Medication Sig Start Date End Date Taking? Authorizing Provider  acetaminophen (TYLENOL) 500 MG tablet Take 2 tablets (1,000 mg total) by mouth every 8 (eight) hours. Patient taking differently: Take 1,000 mg by mouth every 8 (eight) hours as needed for mild pain or headache.  10/15/14  Yes Swinteck, Aaron Edelman, MD  albuterol (PROVENTIL HFA;VENTOLIN HFA) 108 (90 Base) MCG/ACT inhaler Inhale 2 puffs into the lungs every 6 (six) hours as needed. Patient taking differently: Inhale 2 puffs into the lungs every 6 (six) hours as needed for wheezing or shortness of breath.  01/21/17  Yes Juanito Doom, MD  bismuth subsalicylate (PEPTO BISMOL) 262 MG/15ML suspension Take 30 mLs by mouth every 6 (six) hours as needed for indigestion.   Yes [provider]  clonazePAM (KLONOPIN) 0.5 MG tablet Take 0.5 mg by mouth 2 (two) times daily as needed. for anxiety 08/31/18  Yes [provider]  clorazepate (TRANXENE) 7.5 MG tablet Take 7.5 mg by mouth at bedtime.    Yes [provider]    diltiazem (CARDIZEM CD) 360 MG 24 hr capsule Take 1 capsule (360 mg total) by mouth daily. 04/06/18  Yes Bonnielee Haff, MD  ELIQUIS 2.5 MG TABS tablet Take 2.5 mg by mouth 2 (two) times daily. 08/20/18  Yes [provider]  folic acid (FOLVITE) 756 MCG tablet Take 400 mcg by mouth daily.   Yes [provider]  furosemide (LASIX) 20 MG tablet TAKE 2 TABLETS BY MOUTH ONCE DAILY Patient taking differently: Take 20 mg by mouth daily.  09/26/18  Yes Park Liter, MD  gabapentin (NEURONTIN) 100 MG capsule Take 200 mg by mouth at bedtime.    Yes [provider]  hydrocortisone (ANUSOL-HC) 25 MG suppository Place 25 mg rectally daily as needed for hemorrhoids.    Yes [provider]  levothyroxine (SYNTHROID, LEVOTHROID) 75 MCG tablet Take 75 mcg by mouth daily.     Yes [provider]  magnesium hydroxide (MILK OF MAGNESIA) 400 MG/5ML suspension Take 5-15 mLs by mouth daily as needed for mild constipation.    Yes [provider]  Multiple Vitamins-Minerals (ICAPS AREDS 2) CAPS Take 1-2 capsules by mouth daily.    Yes [provider]  nitroGLYCERIN (NITROSTAT) 0.4 MG SL tablet Place 0.4 mg under the tongue every 5 (five) minutes as needed for chest pain.    Yes [provider]  ondansetron (ZOFRAN) 4 MG tablet TAKE 1 TABLET BY MOUTH EVERY 8 HOURS AS NEEDED FOR NAUSEA OR VOMITING Patient taking differently: Take 4 mg by mouth every 8 (eight) hours as needed for nausea or vomiting.  04/14/18  Yes Zehr, Janett Billow D, PA-C  OXYGEN Inhale 2 L into the lungs at bedtime. As instructed: *May use daily as needed   Yes [provider]  Polyethyl Glycol-Propyl Glycol (SYSTANE) 0.4-0.3 % SOLN Place 1 drop into both eyes at bedtime as needed (for dry eyes).    Yes [provider]  PROCTOZONE-HC 2.5 % rectal cream Place 1 application rectally 2 (two) times daily.  06/20/18  Yes [provider]  sodium chloride HYPERTONIC  3 % nebulizer solution Take by nebulization 2 (two) times daily. Patient taking differently: Take 4 mLs by nebulization 2 (two) times daily as needed for other or cough.  01/21/17  Yes Juanito Doom, MD  trimethoprim-polymyxin b (POLYTRIM) ophthalmic solution Place 1 drop into the left eye See admin instructions. Instill 1 drop into the left eye three times a day for the 1st day and 1 drop four times a day on the 2nd day AFTER INJECTION 03/14/18  Yes [provider]  vitamin B-12 (CYANOCOBALAMIN) 1000 MCG tablet Take 1,000 mcg by mouth daily.   Yes [provider]     Allergies:     Allergies  Allergen Reactions  . Shrimp [Shellfish Allergy] Nausea And Vomiting, Swelling and Other (See Comments)    Made her run a fever also  . Fish Allergy Nausea And Vomiting, Swelling and Other (See Comments)    Extremely sick  . Adhesive [Tape] Other (See Comments)    Tape BRUISES and TEARS THE SKIN; please use an alternative!!  . Other Other (See Comments)    Does not remember which "mycin" drug = yeast infection No seeds or nuts = Digestive isues     Physical Exam:   Vitals  Blood pressure (!) 141/75, pulse 68, temperature 98 F (36.7 C), temperature source Oral, resp. rate 19, SpO2 95 %.  1.  General: Appears in no acute distress  2. Psychiatric: Alert, oriented x3, intact insight and judgment  3. Neurologic: Cranial nerve II through XII grossly intact, no focal deficit noted  4. HEENMT:  Atraumatic normocephalic  5. Respiratory : Decreased breath sounds at lung bases  6. Cardiovascular : S1-S2, regular, no murmur auscultated  7. Gastrointestinal:  Abdomen is soft, nontender, no organomegaly  8. Skin:  Patient has skin tear noted at the dorsal aspect of left arm with surrounding erythema  9.Musculoskeletal:  Bilateral edema in the lower extremities mild erythema noted    Data Review:    CBC Recent Labs  Lab 10/11/18 2106  WBC 8.6  HGB 11.7*   HCT 36.5  PLT 330  MCV 97.3  MCH 31.2  MCHC 32.1  RDW 14.0  LYMPHSABS 1.1  MONOABS 1.0  EOSABS 0.2  BASOSABS 0.0   ------------------------------------------------------------------------------------------------------------------  Results for orders placed or performed during the hospital encounter of 10/11/18 (from the past 48 hour(s))  Influenza panel by PCR (type A & B)     Status: None   Collection Time: 10/11/18  8:53 PM  Result Value Ref Range   Influenza A By PCR NEGATIVE NEGATIVE   Influenza B By PCR NEGATIVE NEGATIVE    Comment: (NOTE) The Xpert Xpress Flu assay is intended as an aid in the diagnosis of  influenza and should not be used as a sole basis for treatment.  This  assay is FDA approved for nasopharyngeal swab specimens only. Nasal  washings and aspirates are unacceptable for Xpert Xpress Flu testing. Performed at [facility], [address], [place], [place] [address]   Comprehensive metabolic panel     Status: Abnormal   Collection Time: 10/11/18  9:06 PM  Result Value Ref Range   Sodium 134 (L) 135 - 145 mmol/L   Potassium 4.1 3.5 - 5.1 mmol/L   Chloride 94 (L) 98 - 111 mmol/L   CO2 29 22 - 32 mmol/L   Glucose, Bld 111 (H) 70 - 99 mg/dL   BUN 27 (H) 8 - 23 mg/dL   Creatinine, Ser 0.92 0.44 - 1.00 mg/dL   Calcium 8.5 (L) 8.9 - 10.3 mg/dL   Total Protein 7.6 6.5 - 8.1 g/dL   Albumin 3.3 (L) 3.5 - 5.0 g/dL   AST  39 15 - 41 U/L   ALT 28 0 - 44 U/L   Alkaline Phosphatase 97 38 - 126 U/L   Total Bilirubin 0.6 0.3 - 1.2 mg/dL   GFR calc non Af Amer 55 (L) >60 mL/min   GFR calc Af Amer >60 >60 mL/min   Anion gap 11 5 - 15    Comment: Performed at [facility], [address], [place], [place] [address]  Brain natriuretic peptide     Status: Abnormal   Collection Time: 10/11/18  9:06 PM  Result Value Ref Range   B Natriuretic Peptide 390.0 (H) 0.0 - 100.0 pg/mL    Comment: Performed at [facility], [address], [place], [place] [address]  Troponin I  - Once     Status: None   Collection Time: 10/11/18  9:06 PM  Result Value Ref Range   Troponin I <0.03 <0.03 ng/mL    Comment: Performed at [facility], [address], [place], [place] [address]  CBC with Differential     Status: Abnormal   Collection Time: 10/11/18  9:06 PM  Result Value Ref Range   WBC 8.6 4.0 - 10.5 K/uL   RBC 3.75 (L) 3.87 - 5.11 MIL/uL   Hemoglobin 11.7 (L) 12.0 - 15.0 g/dL   HCT 36.5 36.0 - 46.0 %   MCV 97.3 80.0 - 100.0 fL   MCH 31.2 26.0 - 34.0 pg   MCHC 32.1 30.0 - 36.0 g/dL   RDW 14.0 11.5 - 15.5 %   Platelets 330 150 - 400 K/uL   nRBC 0.2 0.0 - 0.2 %   Neutrophils Relative % 71 %   Neutro Abs 6.3 1.7 - 7.7 K/uL   Lymphocytes Relative 13 %   Lymphs Abs 1.1 0.7 - 4.0 K/uL   Monocytes Relative 12 %   Monocytes Absolute 1.0 0.1 - 1.0 K/uL   Eosinophils Relative 2 %   Eosinophils Absolute 0.2 0.0 - 0.5 K/uL   Basophils Relative 1 %   Basophils Absolute 0.0 0.0 - 0.1 K/uL   Immature Granulocytes 1 %   Abs Immature Granulocytes 0.05 0.00 - 0.07 K/uL    Comment: Performed at [facility], [address], [place], [place] [address]    Chemistries  Recent Labs  Lab 10/11/18 2106  NA 134*  K 4.1  CL 94*  CO2 29  GLUCOSE 111*  BUN 27*  CREATININE 0.92  CALCIUM 8.5*  AST 39  ALT 28  ALKPHOS 97  BILITOT 0.6   ------------------------------------------------------------------------------------------------------------------  ------------------------------------------------------------------------------------------------------------------ GFR: CrCl cannot be calculated (Unknown ideal weight.). Liver Function Tests: Recent Labs  Lab 10/11/18 2106  AST 39  ALT 28  ALKPHOS 97  BILITOT 0.6  PROT 7.6  ALBUMIN 3.3*   No results for input(s): LIPASE, AMYLASE in the last 168 hours. No results for input(s): AMMONIA in the last 168 hours. Coagulation Profile: No results for input(s): INR, PROTIME in the last 168 hours. Cardiac Enzymes: Recent  Labs  Lab 10/11/18 2106  TROPONINI <0.03   BNP (last 3 results) Recent Labs    06/15/18 1429  PROBNP 2,504*   HbA1C: No results for input(s): HGBA1C in the last 72 hours. CBG: No results for input(s): GLUCAP in the last 168 hours. Lipid Profile: No results for input(s): CHOL, HDL, LDLCALC, TRIG, CHOLHDL, LDLDIRECT in the last 72 hours. Thyroid Function Tests: No results for input(s): TSH, T4TOTAL, FREET4, T3FREE, THYROIDAB in the last 72 hours. Anemia Panel: No results for input(s): VITAMINB12, FOLATE, FERRITIN,  TIBC, IRON, RETICCTPCT in the last 72 hours.  --------------------------------------------------------------------------------------------------------------- Urine analysis:    Component Value Date/Time   COLORURINE COLORLESS (A) 06/28/2018 0826   APPEARANCEUR CLEAR 06/28/2018 0826   LABSPEC 1.005 06/28/2018 0826   PHURINE 8.0 06/28/2018 0826   GLUCOSEU NEGATIVE 06/28/2018 0826   HGBUR NEGATIVE 06/28/2018 0826   BILIRUBINUR NEGATIVE 06/28/2018 0826   KETONESUR NEGATIVE 06/28/2018 0826   PROTEINUR NEGATIVE 06/28/2018 0826   UROBILINOGEN 0.2 09/21/2014 1325   NITRITE NEGATIVE 06/28/2018 0826   LEUKOCYTESUR NEGATIVE 06/28/2018 0826      Imaging Results:    Dg Chest 2 View  Result Date: 10/11/2018 CLINICAL DATA:  Worsening shortness of breath for 3 days. Pain all over. History of COPD and CHF. EXAM: CHEST - 2 VIEW COMPARISON:  03/29/2018 FINDINGS: Cardiac enlargement. Bilateral perihilar airspace disease, likely representing edema. Increasing bilateral pleural effusions with basilar atelectasis or consolidation since previous study. Emphysematous changes in the lungs. No pneumothorax. Degenerative changes in the spine. IMPRESSION: Cardiac enlargement. Perihilar edema. Increasing bilateral pleural effusions and basilar atelectasis or consolidation since previous study. Electronically Signed   By: Lucienne Capers M.D.   On: 10/11/2018 22:13    My personal review of  EKG: Rhythm atrial fibrillation   Assessment & Plan:    Active Problems:   Acute bronchitis   1. Acute bronchitis/bronchiectasis exacerbation-patient presenting with signs and symptoms of acute bronchitis. She does have history of underlying COPD/bronchiectasis.  Chest x-ray shows no infiltrate.  Start DuoNeb every 6 hours, Mucinex 1 tablet p.o. twice daily, doxycycline 100 mg IV every 12 hours.  2. Cellulitis-patient has developed cellulitis of left forearm also erythema noted in bilateral lower extremities.  Started doxycycline as above.  3. Paroxysmal atrial fibrillation-heart rate is controlled,Continue Cardizem.  Continue anticoagulation with Eliquis  4. Hypothyroidism-continue Synthroid  5. Chronic diastolic CHF-we will compensated, continue Lasix.    DVT Prophylaxis-   Eliquis  AM Labs Ordered, also please review Full Orders  Family Communication: Admission, patients condition and plan of care including tests being ordered have been discussed with the patient and *her husband at bedside* who indicate understanding and agree with the plan and Code Status.  Code Status: DNR  : Based on patients clinical presentation and evaluation of above clinical data, I have made determination that patient will need less than 2 midnight stay in the hospital.  Patient started on duo nebs every 6 hours, IV doxycycline.  Time spent in minutes : 60 minutes   Oswald Hillock M.D on 10/12/2018 at 2:51 AM

## 2018-10-12 NOTE — Progress Notes (Signed)
COVERAGE NOTE   10/12/2018 11:43 AM  [name] was seen and examined.  The H&P by the admitting provider, orders, imaging was reviewed.  Please see new orders.  Will continue to follow.  Pt has a more difficult situation with her husband in the room that seems to require a lot of care and patient is unable to provide at this time, I have asked for the care management/social worker team to assist with husband's care plan.    Vitals:   10/12/18 0811 10/12/18 0824  BP:  132/73  Pulse:  100  Resp:    Temp:    SpO2: 95% 94%    Results for orders placed or performed during the hospital encounter of 10/11/18  Influenza panel by PCR (type A & B)  Result Value Ref Range   Influenza A By PCR NEGATIVE NEGATIVE   Influenza B By PCR NEGATIVE NEGATIVE  Comprehensive metabolic panel  Result Value Ref Range   Sodium 134 (L) 135 - 145 mmol/L   Potassium 4.1 3.5 - 5.1 mmol/L   Chloride 94 (L) 98 - 111 mmol/L   CO2 29 22 - 32 mmol/L   Glucose, Bld 111 (H) 70 - 99 mg/dL   BUN 27 (H) 8 - 23 mg/dL   Creatinine, Ser 0.92 0.44 - 1.00 mg/dL   Calcium 8.5 (L) 8.9 - 10.3 mg/dL   Total Protein 7.6 6.5 - 8.1 g/dL   Albumin 3.3 (L) 3.5 - 5.0 g/dL   AST 39 15 - 41 U/L   ALT 28 0 - 44 U/L   Alkaline Phosphatase 97 38 - 126 U/L   Total Bilirubin 0.6 0.3 - 1.2 mg/dL   GFR calc non Af Amer 55 (L) >60 mL/min   GFR calc Af Amer >60 >60 mL/min   Anion gap 11 5 - 15  Brain natriuretic peptide  Result Value Ref Range   B Natriuretic Peptide 390.0 (H) 0.0 - 100.0 pg/mL  Troponin I - Once  Result Value Ref Range   Troponin I <0.03 <0.03 ng/mL  CBC with Differential  Result Value Ref Range   WBC 8.6 4.0 - 10.5 K/uL   RBC 3.75 (L) 3.87 - 5.11 MIL/uL   Hemoglobin 11.7 (L) 12.0 - 15.0 g/dL   HCT 36.5 36.0 - 46.0 %   MCV 97.3 80.0 - 100.0 fL   MCH 31.2 26.0 - 34.0 pg   MCHC 32.1 30.0 - 36.0 g/dL   RDW 14.0 11.5 - 15.5 %   Platelets 330 150 - 400 K/uL   nRBC 0.2 0.0 - 0.2 %   Neutrophils Relative %  71 %   Neutro Abs 6.3 1.7 - 7.7 K/uL   Lymphocytes Relative 13 %   Lymphs Abs 1.1 0.7 - 4.0 K/uL   Monocytes Relative 12 %   Monocytes Absolute 1.0 0.1 - 1.0 K/uL   Eosinophils Relative 2 %   Eosinophils Absolute 0.2 0.0 - 0.5 K/uL   Basophils Relative 1 %   Basophils Absolute 0.0 0.0 - 0.1 K/uL   Immature Granulocytes 1 %   Abs Immature Granulocytes 0.05 0.00 - 0.07 K/uL  CBC  Result Value Ref Range   WBC 7.1 4.0 - 10.5 K/uL   RBC 3.68 (L) 3.87 - 5.11 MIL/uL   Hemoglobin 11.3 (L) 12.0 - 15.0 g/dL   HCT 36.1 36.0 - 46.0 %   MCV 98.1 80.0 - 100.0 fL   MCH 30.7 26.0 - 34.0 pg   MCHC 31.3 30.0 -  36.0 g/dL   RDW 13.8 11.5 - 15.5 %   Platelets 257 150 - 400 K/uL   nRBC 0.0 0.0 - 0.2 %  Comprehensive metabolic panel  Result Value Ref Range   Sodium 137 135 - 145 mmol/L   Potassium 3.7 3.5 - 5.1 mmol/L   Chloride 99 98 - 111 mmol/L   CO2 29 22 - 32 mmol/L   Glucose, Bld 87 70 - 99 mg/dL   BUN 23 8 - 23 mg/dL   Creatinine, Ser 0.81 0.44 - 1.00 mg/dL   Calcium 8.1 (L) 8.9 - 10.3 mg/dL   Total Protein 6.7 6.5 - 8.1 g/dL   Albumin 2.9 (L) 3.5 - 5.0 g/dL   AST 29 15 - 41 U/L   ALT 24 0 - 44 U/L   Alkaline Phosphatase 84 38 - 126 U/L   Total Bilirubin 0.5 0.3 - 1.2 mg/dL   GFR calc non Af Amer >60 >60 mL/min   GFR calc Af Amer >60 >60 mL/min   Anion gap 9 5 - 15    C. Wynetta Emery, MD [facility]   10/11/2018  8:30 PM How to contact the [facility] Attending or Consulting provider [place] or covering provider during after hours [place], for this patient?  1. Check the care team in [facility] and look for a) attending/consulting TRH provider listed and b) the [facility] team listed 2. Log into [email or web address] and use [place]'s universal password to access. If you do not have the password, please contact the hospital operator. 3. Locate the [facility] provider you are looking for under [facility] and page to a number that you can be directly reached. 4. If you still have difficulty reaching the  provider, please page the [facility] (Director on Call) for the Hospitalists listed on amion for assistance.

## 2018-10-12 NOTE — TOC Initial Note (Signed)
Transition of Care [facility]) - Initial/Assessment Note    Patient Details  Name: [name] MRN: [id number] Date of Birth: 06-07-1927  Transition of Care [facility]) CM/SW Contact:    Sherald Barge, RN Phone Number: 10/12/2018, 2:18 PM  Clinical Narrative:                 Pt's husband at bedside and staying with patient overnight. Pt and husband live alone. Have been married two years. Pt has no family, husband has two sons that live in [place]. CM consulted to assist with husbands safety. Husband unable to care for himself, hospital staffing having to assist. Pt assists at home. Pt's husband gets [place] through [facility]. Pt has gotten services in the past through same company.  CM called [facility] and per rep there is nothing of note in husbands chart that anything is wrong at home. CM has contacted family friend Mr. Fernande Boyden who provides transport to MD appointments and checks on pts daily. Mr. Fernande Boyden is concerned for their wellbeing. Husband falls 3-5 times a week. Mr archer has health problems himself and can not physically care for pt nor husband at this time.   CM has requested PT eval for patient.   CM has contacted APS about concerns for pt and husbands wellbeing if returning home. DSS is willing to help with placement if pt needs placement and Mr. Holdsworth is agreeable to go with her.   CM will provide update to [facility] [place] after PT eval.            Living arrangements for the past 2 months: [place]                          Prior Living Arrangements/Services Living arrangements for the past 2 months: Single Family Home Lives with:: Spouse Patient language and need for interpreter reviewed:: No Do you feel safe going back to the place where you live?: Yes(TOC member does not agree)      Need for Family Participation in Patient Care: Yes (Comment) Care giver support system in place?: No (comment)   Criminal Activity/Legal Involvement Pertinent to Current  Situation/Hospitalization: No - Comment as needed  Activities of Daily Living Home Assistive Devices/Equipment: Hospital bed, Cane (specify quad or straight), Oxygen, Walker (specify type), Bedside commode/3-in-1, Nebulizer ADL Screening (condition at time of admission) Patient's cognitive ability adequate to safely complete daily activities?: Yes Is the patient deaf or have difficulty hearing?: No Does the patient have difficulty seeing, even when wearing glasses/contacts?: No Does the patient have difficulty concentrating, remembering, or making decisions?: No Patient able to express need for assistance with ADLs?: Yes Does the patient have difficulty dressing or bathing?: No Independently performs ADLs?: No Communication: Independent Dressing (OT): Independent Grooming: Independent Feeding: Independent Bathing: Independent Toileting: Needs assistance Is this a change from baseline?: Pre-admission baseline In/Out Bed: Needs assistance Is this a change from baseline?: Pre-admission baseline Walks in Home: Independent with device (comment) Does the patient have difficulty walking or climbing stairs?: Yes Weakness of Legs: Both Weakness of Arms/Hands: None  Permission Sought/Granted Permission sought to share information with : Other (comment), Family Supports Permission granted to share information with : Yes, Verbal Permission Granted  Share Information with NAME: Mr Fernande Boyden     Permission granted to share info w Relationship: neighbor and support person     Emotional Assessment Appearance:: Appears stated age Attitude/Demeanor/Rapport: Avoidant Affect (typically observed): Calm, Appropriate, Hopeful, In  denial Orientation: : Oriented to Self, Oriented to Place, Oriented to Situation      Admission diagnosis:  Acute URI [J06.9] Bronchospasm [J98.01] Patient Active Problem List   Diagnosis Date Noted  . Acute bronchitis 10/12/2018  . Acute URI   . Bronchospasm   .  Cellulitis, leg 06/28/2018  . Chronic diastolic CHF (congestive heart failure) ([place]) 06/28/2018  . AF (paroxysmal atrial fibrillation) ([place]) 06/28/2018  . Facial laceration   . Poor appetite 05/04/2018  . Atrial fibrillation with RVR ([place]) 03/30/2018  . Dysphagia 03/30/2018  . Diastolic CHF ([place]) 92/92/4462  . Abdominal distension (gaseous) 01/20/2018  . Loss of weight 01/20/2018  . Nausea and vomiting 01/20/2018  . Constipation 01/20/2018  . Peripheral neuropathy 08/30/2017  . Peri-prosthetic fracture of femur following total hip arthroplasty 10/13/2014  . Protein-calorie malnutrition, severe ([place]) 10/12/2014  . Periprosthetic fracture around internal prosthetic right hip joint ([place]) 10/12/2014  . COPD exacerbation ([place])   . Acute cystitis without hematuria   . Hypothyroidism   . Hip fracture ([place]) 09/21/2014  . Fall at home 09/21/2014  . Syncope and collapse 07/02/2014  . Osteoporosis 04/10/2013  . Vaginal atrophy 04/10/2013  . Female pelvic pain 04/10/2013  . Hemoptysis, unspecified 10/12/2011  . Bronchiectasis without acute exacerbation ([place]) 05/19/2011   PCP:  [name], MD Pharmacy:   [facility] [address] [place] [address] [place] [address] Phone: [phone] Fax: [place], [place] [address] Phone: [phone] Fax: [phone]     Social Determinants of Health (SDOH) Interventions    Readmission Risk Interventions  Readmission Risk Prevention Plan 10/12/2018  Transportation Screening Complete  Home Care Screening Complete  Medication Review (RN CM) Complete  Some recent data might be hidden

## 2018-10-13 ENCOUNTER — Inpatient Hospital Stay (HOSPITAL_COMMUNITY): Payer: Medicare Other

## 2018-10-13 DIAGNOSIS — J441 Chronic obstructive pulmonary disease with (acute) exacerbation: Secondary | ICD-10-CM

## 2018-10-13 DIAGNOSIS — E440 Moderate protein-calorie malnutrition: Secondary | ICD-10-CM

## 2018-10-13 MED ORDER — FUROSEMIDE 10 MG/ML IJ SOLN
20.0000 mg | Freq: Once | INTRAMUSCULAR | Status: AC
  Administered 2018-10-13: 20 mg via INTRAVENOUS
  Filled 2018-10-13: qty 2

## 2018-10-13 MED ORDER — POTASSIUM CHLORIDE CRYS ER 20 MEQ PO TBCR
40.0000 meq | EXTENDED_RELEASE_TABLET | Freq: Two times a day (BID) | ORAL | Status: AC
  Administered 2018-10-13 – 2018-10-14 (×2): 40 meq via ORAL
  Filled 2018-10-13 (×2): qty 2

## 2018-10-13 MED ORDER — DOXYCYCLINE HYCLATE 100 MG PO TABS
100.0000 mg | ORAL_TABLET | Freq: Two times a day (BID) | ORAL | Status: DC
  Administered 2018-10-13 – 2018-10-20 (×15): 100 mg via ORAL
  Filled 2018-10-13 (×15): qty 1

## 2018-10-13 MED ORDER — FUROSEMIDE 40 MG PO TABS
40.0000 mg | ORAL_TABLET | Freq: Two times a day (BID) | ORAL | Status: DC
  Administered 2018-10-13 – 2018-10-16 (×6): 40 mg via ORAL
  Filled 2018-10-13 (×6): qty 1

## 2018-10-13 NOTE — Progress Notes (Signed)
Patient states that she feels like she can't breathe. Patients O2 is 92% on 3L and patient states "I feel like I am drowning in my own fluid". Patient does not complain with any chest pain and offers no other complaints. Patient is sitting on the side on the bed. Dr. Wynetta Emery is paged to make aware of patient complaints.

## 2018-10-13 NOTE — Progress Notes (Addendum)
Patient was unable to wean to room air per MD order. During assessment patient was found to be 87% on 3L of O2 when turned to 4L she was able to reach 94-95% O2 saturation. MD made aware

## 2018-10-13 NOTE — Progress Notes (Addendum)
PROGRESS NOTE    [name]  TGG:[id number]  DOB: [birth year]  DOA: 10/11/2018 PCP: [name], MD   Brief Admission Hx: 85 year old female with COPD, diastolic CHF, A. fib on Eliquis, chronic respiratory failure on 2 L oxygen presented with worsening shortness of breath.  MDM/Assessment & Plan:   1. Acute bronchitis/COPD exacerbation- continue current treatments as patient is currently improving with scheduled nebs, Mucinex and doxycycline.  We did change this to oral formulation. 2. Cellulitis of the left lower extremity with edema- obtain ultrasound of left leg to rule out DVT but very low likelihood given that she takes apixaban-Continue oral doxycycline as noted above for the infection. 3. Paroxysmal atrial fibrillation- heart rate is controlled and she is fully anticoagulated with apixaban. 4. Hypothyroidism- stable continue levothyroxine. 5. Acute on chronic diastolic congestive heart failure-pt received IV lasix and increased oral lasix, check echocardiogram.  DVT prophylaxis: Apixaban Code Status: DNR Family Communication: Husband remains at bedside Disposition Plan: Tentative plan is for her to potentially go home tomorrow with home health   Consultants:  Care management  Procedures:  N/A  Antimicrobials:  Doxycycline 3/11>  Subjective: Patient is awake and alert and in no distress.  She has improved coughing.  She is not ambulating well.  She is chronically dependent on oxygen.  Objective: Vitals:   10/13/18 0620 10/13/18 1020 10/13/18 1022 10/13/18 1024  BP: 118/66   114/61  Pulse: 67   66  Resp: 20   16  Temp: 97.9 F (36.6 C)   98.1 F (36.7 C)  TempSrc: Oral   Oral  SpO2: 91% 90% (!) 87% 94%  Weight:      Height:        Intake/Output Summary (Last 24 hours) at 10/13/2018 1143 Last data filed at 10/12/2018 2100 Gross per 24 hour  Intake 480 ml  Output 300 ml  Net 180 ml   Filed Weights   10/12/18 0444  Weight: 56.7 kg      REVIEW OF SYSTEMS  As per history otherwise all reviewed and reported negative  Exam:  General exam: Elderly, chronically ill-appearing female awake in no distress. Respiratory system: Better air movement from prior exam, no increased work of breathing. Cardiovascular system: S1 & S2 heard. No JVD, murmurs, gallops, clicks or pedal edema. Gastrointestinal system: Abdomen is nondistended, soft and nontender. Normal bowel sounds heard. Central nervous system: Alert and oriented. No focal neurological deficits. Extremities: Mild edema of the left lower extremity but asymmetric compared to the right lower extremity.  Data Reviewed: Basic Metabolic Panel: Recent Labs  Lab 10/11/18 2106 10/12/18 0426  NA 134* 137  K 4.1 3.7  CL 94* 99  CO2 29 29  GLUCOSE 111* 87  BUN 27* 23  CREATININE 0.92 0.81  CALCIUM 8.5* 8.1*   Liver Function Tests: Recent Labs  Lab 10/11/18 2106 10/12/18 0426  AST 39 29  ALT 28 24  ALKPHOS 97 84  BILITOT 0.6 0.5  PROT 7.6 6.7  ALBUMIN 3.3* 2.9*   No results for input(s): LIPASE, AMYLASE in the last 168 hours. No results for input(s): AMMONIA in the last 168 hours. CBC: Recent Labs  Lab 10/11/18 2106 10/12/18 0426  WBC 8.6 7.1  NEUTROABS 6.3  --   HGB 11.7* 11.3*  HCT 36.5 36.1  MCV 97.3 98.1  PLT 330 257   Cardiac Enzymes: Recent Labs  Lab 10/11/18 2106  TROPONINI <0.03   CBG (last 3)  No results for input(s): GLUCAP in the  last 72 hours. No results found for this or any previous visit (from the past 240 hour(s)).   Studies: Dg Chest 2 View  Result Date: 10/11/2018 CLINICAL DATA:  Worsening shortness of breath for 3 days. Pain all over. History of COPD and CHF. EXAM: CHEST - 2 VIEW COMPARISON:  03/29/2018 FINDINGS: Cardiac enlargement. Bilateral perihilar airspace disease, likely representing edema. Increasing bilateral pleural effusions with basilar atelectasis or consolidation since previous study. Emphysematous changes in  the lungs. No pneumothorax. Degenerative changes in the spine. IMPRESSION: Cardiac enlargement. Perihilar edema. Increasing bilateral pleural effusions and basilar atelectasis or consolidation since previous study. Electronically Signed   By: Lucienne Capers M.D.   On: 10/11/2018 22:13     Scheduled Meds:  apixaban  2.5 mg Oral BID   clorazepate  7.5 mg Oral QHS   diltiazem  360 mg Oral Daily   doxycycline  100 mg Oral Q12H   feeding supplement (ENSURE ENLIVE)  237 mL Oral BID BM   feeding supplement (PRO-STAT SUGAR FREE 64)  30 mL Oral BID   furosemide  20 mg Oral Daily   gabapentin  200 mg Oral QHS   guaiFENesin  600 mg Oral BID   ipratropium-albuterol  3 mL Nebulization Q6H WA   levothyroxine  75 mcg Oral Daily   Continuous Infusions:  sodium chloride 10 mL/hr at 10/12/18 2778   Active Problems:   Acute bronchitis   Acute URI   Bronchospasm   Malnutrition of moderate degree  Time spent:   Irwin Brakeman, MD [facility] 10/13/2018, 11:43 AM    LOS: 1 day  How to contact the [facility] Attending or Consulting provider [place] or covering provider during after hours [place], for this patient?  1. Check the care team in [facility] and look for a) attending/consulting TRH provider listed and b) the [facility] team listed 2. Log into [email or web address] and use [place]'s universal password to access. If you do not have the password, please contact the hospital operator. 3. Locate the [facility] provider you are looking for under [facility] and page to a number that you can be directly reached. 4. If you still have difficulty reaching the provider, please page the [facility] (Director on Call) for the Hospitalists listed on amion for assistance.

## 2018-10-13 NOTE — Progress Notes (Addendum)
10/13/2018 1:21 PM  I receive a secure chat about patient's complaint, not a page.  Secure chat is for nonurgent messages and urgent messages should be paged to MD directly.  I went to assess patient and ordered stat portable CXR, EKG, lasix 20 mg IV and will follow results.  Murvin Natal MD   Update 2:17 PM  Reviewed CXR - consistent with pulmonary edema, patient received lasix 20 mg IV, increased oral lasix to 40 mg BID, next dose 1800.  Give potassium supplement.

## 2018-10-14 ENCOUNTER — Inpatient Hospital Stay (HOSPITAL_COMMUNITY): Payer: Medicare Other

## 2018-10-14 DIAGNOSIS — I340 Nonrheumatic mitral (valve) insufficiency: Secondary | ICD-10-CM

## 2018-10-14 DIAGNOSIS — I361 Nonrheumatic tricuspid (valve) insufficiency: Secondary | ICD-10-CM

## 2018-10-14 LAB — ECHOCARDIOGRAM COMPLETE
HEIGHTINCHES: 67 in
Weight: 2000.01 oz

## 2018-10-14 LAB — RENAL FUNCTION PANEL
Albumin: 3.1 g/dL — ABNORMAL LOW (ref 3.5–5.0)
Anion gap: 8 (ref 5–15)
BUN: 19 mg/dL (ref 8–23)
CO2: 34 mmol/L — ABNORMAL HIGH (ref 22–32)
Calcium: 8.3 mg/dL — ABNORMAL LOW (ref 8.9–10.3)
Chloride: 95 mmol/L — ABNORMAL LOW (ref 98–111)
Creatinine, Ser: 0.82 mg/dL (ref 0.44–1.00)
GFR calc non Af Amer: >60 mL/min (ref >60)
Glucose, Bld: 123 mg/dL — ABNORMAL HIGH (ref 70–99)
Phosphorus: 3.7 mg/dL (ref 2.5–4.6)
Potassium: 3.8 mmol/L (ref 3.5–5.1)
Sodium: 137 mmol/L (ref 135–145)

## 2018-10-14 NOTE — NC FL2 (Signed)
[place] LEVEL OF CARE SCREENING TOOL     IDENTIFICATION  Patient Name: [name] Birthdate: 07/29/1927 Sex: female Admission Date (Current Location): 10/11/2018  [facility] and [place] Number:  [employer] and Address:  [place] [address] [place]      Provider Number: [id number]  Attending Physician Name and Address:  Murlean Iba, MD  Relative Name and Phone Number:  Kymberli Wiegand [phone]    Current Level of Care: Hospital Recommended Level of Care: [place] Prior Approval Number:    Date Approved/Denied:   PASRR Number:    Discharge Plan: SNF    Current Diagnoses: Patient Active Problem List   Diagnosis Date Noted  . Malnutrition of moderate degree 10/13/2018  . Acute bronchitis 10/12/2018  . Acute URI   . Bronchospasm   . Cellulitis, leg 06/28/2018  . Chronic diastolic CHF (congestive heart failure) ([place]) 06/28/2018  . AF (paroxysmal atrial fibrillation) ([place]) 06/28/2018  . Facial laceration   . Poor appetite 05/04/2018  . Atrial fibrillation with RVR ([place]) 03/30/2018  . Dysphagia 03/30/2018  . Diastolic CHF ([place]) 44/81/8563  . Abdominal distension (gaseous) 01/20/2018  . Loss of weight 01/20/2018  . Nausea and vomiting 01/20/2018  . Constipation 01/20/2018  . Peripheral neuropathy 08/30/2017  . Peri-prosthetic fracture of femur following total hip arthroplasty 10/13/2014  . Protein-calorie malnutrition, severe ([place]) 10/12/2014  . Periprosthetic fracture around internal prosthetic right hip joint ([place]) 10/12/2014  . COPD exacerbation ([place])   . Acute cystitis without hematuria   . Hypothyroidism   . Hip fracture ([place]) 09/21/2014  . Fall at home 09/21/2014  . Syncope and collapse 07/02/2014  . Osteoporosis 04/10/2013  . Vaginal atrophy 04/10/2013  . Female pelvic pain 04/10/2013  . Hemoptysis, unspecified 10/12/2011  . Bronchiectasis without acute exacerbation  ([place]) 05/19/2011    Orientation RESPIRATION BLADDER Height & Weight     Self, Time, Situation, Place  O2(3lpm) Continent Weight: 56.7 kg Height:  5\' 7"  (170.2 cm)  BEHAVIORAL SYMPTOMS/MOOD NEUROLOGICAL BOWEL NUTRITION STATUS      Continent Diet(heart healthy)  AMBULATORY STATUS COMMUNICATION OF NEEDS Skin   Limited Assist Verbally Skin abrasions, Bruising, Other (Comment)(cellulitis to bilateral lower extremities, bruising to left arm, skin tear to left wrist)                       Personal Care Assistance Level of Assistance  Bathing, Feeding, Dressing Bathing Assistance: Maximum assistance Feeding assistance: Independent Dressing Assistance: Maximum assistance     Functional Limitations Info  Sight, Hearing, Speech Sight Info: Impaired Hearing Info: Adequate Speech Info: Adequate    SPECIAL CARE FACTORS FREQUENCY  PT (By licensed PT)     PT Frequency: 5days/week              Contractures Contractures Info: Not present    Additional Factors Info  Psychotropic Code Status Info: DNR Allergies Info: shrimp, fish, adhesive tape, "mycin" drug (not sure which one) Psychotropic Info: klonopin         Current Medications (10/14/2018):  This is the current hospital active medication list Current Facility-Administered Medications  Medication Dose Route Frequency Provider Last Rate Last Dose  . 0.9 %  sodium chloride infusion   Intravenous Continuous Oswald Hillock, MD 10 mL/hr at 10/12/18 [id number]    . acetaminophen (TYLENOL) tablet 650 mg  650 mg Oral Q6H PRN Oswald Hillock, MD  Or  . acetaminophen (TYLENOL) suppository 650 mg  650 mg Rectal Q6H PRN Oswald Hillock, MD      . albuterol (PROVENTIL) (2.5 MG/3ML) 0.083% nebulizer solution 2.5 mg  2.5 mg Nebulization Q4H PRN Johnson, Clanford L, MD      . apixaban (ELIQUIS) tablet 2.5 mg  2.5 mg Oral BID Oswald Hillock, MD   2.5 mg at 10/14/18 0959  . clonazePAM (KLONOPIN) tablet 0.5 mg  0.5 mg Oral BID PRN Oswald Hillock, MD      . clorazepate (TRANXENE) tablet 7.5 mg  7.5 mg Oral QHS Oswald Hillock, MD   7.5 mg at 10/13/18 2245  . diltiazem (CARDIZEM CD) 24 hr capsule 360 mg  360 mg Oral Daily Oswald Hillock, MD   360 mg at 10/14/18 0959  . doxycycline (VIBRA-TABS) tablet 100 mg  100 mg Oral Q12H [place], Clanford L, MD   100 mg at 10/14/18 0958  . feeding supplement (ENSURE ENLIVE) (ENSURE ENLIVE) liquid 237 mL  237 mL Oral BID BM Oswald Hillock, MD   237 mL at 10/14/18 0959  . feeding supplement (PRO-STAT SUGAR FREE 64) liquid 30 mL  30 mL Oral BID [place], Clanford L, MD   30 mL at 10/14/18 0959  . furosemide (LASIX) tablet 40 mg  40 mg Oral BID Wynetta Emery, Clanford L, MD   40 mg at 10/14/18 0958  . gabapentin (NEURONTIN) capsule 200 mg  200 mg Oral QHS Oswald Hillock, MD   200 mg at 10/13/18 2245  . guaiFENesin (MUCINEX) 12 hr tablet 600 mg  600 mg Oral BID Oswald Hillock, MD   600 mg at 10/14/18 0959  . ipratropium-albuterol (DUONEB) 0.5-2.5 (3) MG/3ML nebulizer solution 3 mL  3 mL Nebulization Q6H WA Johnson, Clanford L, MD   3 mL at 10/14/18 0813  . levothyroxine (SYNTHROID, LEVOTHROID) tablet 75 mcg  75 mcg Oral Daily Oswald Hillock, MD   75 mcg at 10/13/18 0537  . nitroGLYCERIN (NITROSTAT) SL tablet 0.4 mg  0.4 mg Sublingual Q5 min PRN Oswald Hillock, MD      . ondansetron [facility]) tablet 4 mg  4 mg Oral Q6H PRN Oswald Hillock, MD       Or  . ondansetron [facility]) injection 4 mg  4 mg Intravenous Q6H PRN Oswald Hillock, MD   4 mg at 10/14/18 0826  . sodium chloride HYPERTONIC 3 % nebulizer solution 4 mL  4 mL Nebulization BID PRN Oswald Hillock, MD         Discharge Medications: Please see discharge summary for a list of discharge medications.  Relevant Imaging Results:  Relevant Lab Results:   Additional Information SS# [id number]  Sherald Barge, RN

## 2018-10-14 NOTE — Care Management (Signed)
Pt has accepted bed offer at [facility]. CM requested ins auth be started.

## 2018-10-14 NOTE — TOC Progression Note (Signed)
Transition of Care [facility]) - Progression Note    Patient Details  Name: [name] MRN: [id number] Date of Birth: 05/11/1927  Transition of Care [facility]) CM/SW Contact  Melinda Shutters [name], RN Phone Number: 10/14/2018, 12:05 PM  Clinical Narrative:  PT recommending SNF. CM to see pt afternoon on 3/12 and pt was not interested in SNF, husband understands its needed. Pt had hypoxic event last night and not easily aroused during my visit (did awaken to work with PT this AM). CM discussed PT"s recommendations with husband at bedside who says they discussed STR last night and pt agreeable to either [facility] or [facility] Referrals have been sent. CM has contacted APS about finding placement for husband while wife rehabs. Awaiting call back.      Expected Discharge Plan: [place]    Expected Discharge Plan and Services Expected Discharge Plan: [place] Choice: [place] arrangements for the past 2 months: Single Family Home                         Patient Goals and CMS Choice Patient states their goals for this hospitalization and ongoing recovery are:: be safe CMS Medicare.gov Compare Post Acute Care list provided to:: Patient Choice offered to / list presented to : Patient, Spouse   Social Determinants of Health (SDOH) Interventions    Readmission Risk Interventions 30 Day Unplanned Readmission Risk Score     ED to Hosp-Admission (Current) from 10/11/2018 in [place]  30 Day Unplanned Readmission Risk Score (%)  22 Filed at 10/14/2018 1200     This score is the patient's risk of an unplanned readmission within 30 days of being discharged (0 -100%). The score is based on dignosis, age, lab data, medications, orders, and past utilization.   Low:  0-14.9   Medium: 15-21.9   High: 22-29.9   Extreme: 30 and above       Readmission Risk Prevention Plan 10/12/2018   Transportation Screening Complete  Home Care Screening Complete  Medication Review (RN CM) Complete  Some recent data might be hidden

## 2018-10-14 NOTE — Progress Notes (Signed)
  Echocardiogram 2D Echocardiogram has been attempted. Patient eating lunch. Reattempt at later time.  Murline Weigel G Dondre Catalfamo 10/14/2018, 1:44 PM

## 2018-10-14 NOTE — Progress Notes (Signed)
  Echocardiogram 2D Echocardiogram has been performed.  Kristeen Lantz G Vinisha Faxon 10/14/2018, 5:50 PM

## 2018-10-14 NOTE — Progress Notes (Signed)
PROGRESS NOTE    [name]  MPN:[id number]  DOB: 01-17-1927  DOA: 10/11/2018 PCP: [name], MD   Brief Admission Hx: 85 year old female with COPD, diastolic CHF, A. fib on Eliquis, chronic respiratory failure on 2 L oxygen presented with worsening shortness of breath.  She developed acute on chronic diastolic CHF and has been diuresing on lasix.  She has become too weak and deconditioned to return home and likely will require SNF placement.    MDM/Assessment & Plan:    1. Acute bronchitis/COPD exacerbation- continue current treatments as patient is currently improving with scheduled nebs, Mucinex and doxycycline.  2. Cellulitis of the left lower extremity with edema-  Improving with antibiotic treatments.  Ultrasound of left leg negative for DVT but very low likelihood given that she takes apixaban-Continue oral doxycycline as noted above for the infection. 3. Paroxysmal atrial fibrillation- heart rate is controlled and she is fully anticoagulated with apixaban. 4. Hypothyroidism- stable continue levothyroxine. 5. Acute on chronic diastolic congestive heart failure-pt received IV lasix and increased oral lasix, check 2D echocardiogram. Check daily weights and monitor Intake and output.    DVT prophylaxis: Apixaban Code Status: DNR Family Communication: Husband remains at bedside Disposition Plan: Anticipate patient will need SNF placement, continue inpatient treatments as ordered for diuresis, respiratory infection treatments  Consultants:  Care management  Procedures:  N/A  Antimicrobials:  Doxycycline 3/11>  Subjective: Patient is very weak today and her oxygenation improved now.  She has improved coughing.  She is not ambulating well.  She is HIGH fall risk.  She is chronically dependent on oxygen and it came off last night and was very hypoxic this morning.    Objective: Vitals:   10/13/18 1400 10/13/18 2050 10/13/18 2320 10/14/18 0813  BP: 105/63  129/61    Pulse: 70 99 70   Resp: 20 18 20    Temp: 98.1 F (36.7 C)  (!) 97.3 F (36.3 C)   TempSrc:   Oral   SpO2: 93% 96% 95% (!) 35%  Weight:      Height:        Intake/Output Summary (Last 24 hours) at 10/14/2018 1003 Last data filed at 10/13/2018 2300 Gross per 24 hour  Intake 960 ml  Output 2200 ml  Net -1240 ml   Filed Weights   10/12/18 0444  Weight: 56.7 kg     REVIEW OF SYSTEMS  As per history otherwise all reviewed and reported negative  Exam:  General exam: Elderly, chronically ill-appearing female awake in no distress.  Pt appears emaciated.  Respiratory system: Better air movement from prior exam, no increased work of breathing. Cardiovascular system: S1 & S2 heard. Irregularly irregular.  No JVD, murmurs, gallops, clicks or pedal edema. Gastrointestinal system: Abdomen is nondistended, soft and nontender. Normal bowel sounds heard. Central nervous system: Alert and oriented. No focal neurological deficits. Extremities: Mild edema of the left lower extremity but asymmetric compared to the right lower extremity- improved from prior exam.  Data Reviewed: Basic Metabolic Panel: Recent Labs  Lab 10/11/18 2106 10/12/18 0426 10/14/18 0513  NA 134* 137 137  K 4.1 3.7 3.8  CL 94* 99 95*  CO2 29 29 34*  GLUCOSE 111* 87 123*  BUN 27* 23 19  CREATININE 0.92 0.81 0.82  CALCIUM 8.5* 8.1* 8.3*  PHOS  --   --  3.7   Liver Function Tests: Recent Labs  Lab 10/11/18 2106 10/12/18 0426 10/14/18 0513  AST 39 29  --   ALT  28 24  --   ALKPHOS 97 84  --   BILITOT 0.6 0.5  --   PROT 7.6 6.7  --   ALBUMIN 3.3* 2.9* 3.1*   No results for input(s): LIPASE, AMYLASE in the last 168 hours. No results for input(s): AMMONIA in the last 168 hours. CBC: Recent Labs  Lab 10/11/18 2106 10/12/18 0426  WBC 8.6 7.1  NEUTROABS 6.3  --   HGB 11.7* 11.3*  HCT 36.5 36.1  MCV 97.3 98.1  PLT 330 257   Cardiac Enzymes: Recent Labs  Lab 10/11/18 2106  TROPONINI <0.03    CBG (last 3)  No results for input(s): GLUCAP in the last 72 hours. No results found for this or any previous visit (from the past 240 hour(s)).   Studies: US Venous Img Lower Unilateral Left  Result Date: 10/13/2018 CLINICAL DATA:  Left lower extremity pain and edema. History of fall on 06/28/2018. Evaluate for DVT. EXAM: LEFT LOWER EXTREMITY VENOUS DOPPLER ULTRASOUND TECHNIQUE: Gray-scale sonography with graded compression, as well as color Doppler and duplex ultrasound were performed to evaluate the lower extremity deep venous systems from the level of the common femoral vein and including the common femoral, femoral, profunda femoral, popliteal and calf veins including the posterior tibial, peroneal and gastrocnemius veins when visible. The superficial great saphenous vein was also interrogated. Spectral Doppler was utilized to evaluate flow at rest and with distal augmentation maneuvers in the common femoral, femoral and popliteal veins. COMPARISON:  None. FINDINGS: Contralateral Common Femoral Vein: Respiratory phasicity is normal and symmetric with the symptomatic side. No evidence of thrombus. Normal compressibility. Common Femoral Vein: No evidence of thrombus. Normal compressibility, respiratory phasicity and response to augmentation. Saphenofemoral Junction: No evidence of thrombus. Normal compressibility and flow on color Doppler imaging. Profunda Femoral Vein: No evidence of thrombus. Normal compressibility and flow on color Doppler imaging. Femoral Vein: No evidence of thrombus. Normal compressibility, respiratory phasicity and response to augmentation. Popliteal Vein: No evidence of thrombus. Normal compressibility, respiratory phasicity and response to augmentation. Calf Veins: No evidence of thrombus. Normal compressibility and flow on color Doppler imaging. Superficial Great Saphenous Vein: No evidence of thrombus. Normal compressibility. Venous Reflux:  None. Other Findings: Note is  made of an approximately 1.4 x 2.0 x 0.6 cm serpiginous anechoic fluid collection with the left popliteal fossa compatible with a Baker's cyst. IMPRESSION: 1. No evidence of DVT within the left lower extremity. 2. Incidentally noted approximately 2.0 cm anechoic left-sided Baker's cyst. Electronically Signed   By: Sandi Mariscal M.D.   On: 10/13/2018 13:59   Dg Chest Port 1 View  Result Date: 10/13/2018 CLINICAL DATA:  Shortness of breath.  History of COPD. EXAM: PORTABLE CHEST 1 VIEW COMPARISON:  10/11/2018; 03/29/2018; chest CT-03/29/2018 FINDINGS: Interval increase in now likely moderate to large sized bilateral effusions with associated bibasilar consolidative opacities. Bilateral effusions obscure the bilateral heart borders. The pulmonary vasculature appears indistinct with cephalization flow. Minimal biapical pleuroparenchymal thickening. No definite pneumothorax. No acute osseous abnormalities. IMPRESSION: Findings most suggestive of worsening pulmonary edema with increase in moderate to large-sized bilateral effusions and associated worsening bibasilar consolidative opacities, atelectasis versus infiltrate Electronically Signed   By: Sandi Mariscal M.D.   On: 10/13/2018 14:00     Scheduled Meds:  apixaban  2.5 mg Oral BID   clorazepate  7.5 mg Oral QHS   diltiazem  360 mg Oral Daily   doxycycline  100 mg Oral Q12H   feeding supplement (ENSURE ENLIVE)  237 mL Oral BID BM   feeding supplement (PRO-STAT SUGAR FREE 64)  30 mL Oral BID   furosemide  40 mg Oral BID   gabapentin  200 mg Oral QHS   guaiFENesin  600 mg Oral BID   ipratropium-albuterol  3 mL Nebulization Q6H WA   levothyroxine  75 mcg Oral Daily   Continuous Infusions:  sodium chloride 10 mL/hr at 10/12/18 8768   Active Problems:   Acute bronchitis   Acute URI   Bronchospasm   Malnutrition of moderate degree  Time spent:   Irwin Brakeman, MD [facility] 10/14/2018, 10:03 AM    LOS: 2 days  How to  contact the [facility] Attending or Consulting provider [place] or covering provider during after hours [place], for this patient?  1. Check the care team in [facility] and look for a) attending/consulting TRH provider listed and b) the [facility] team listed 2. Log into [email or web address] and use [place]'s universal password to access. If you do not have the password, please contact the hospital operator. 3. Locate the [facility] provider you are looking for under [facility] and page to a number that you can be directly reached. 4. If you still have difficulty reaching the provider, please page the [facility] (Director on Call) for the Hospitalists listed on amion for assistance.

## 2018-10-14 NOTE — Progress Notes (Signed)
Physical Therapy Treatment Patient Details Name: [name] MRN: [id number] DOB: November 12, 1926 Today's Date: 10/14/2018    History of Present Illness [name]  is a 85 y.o. female, with history of paroxysmal atrial fibrillation on anticoagulation with Eliquis, diastolic CHF, COPD, coronary disease, pernicious anemia, chronic respiratory failure on 2 L oxygen at home, came to hospital with worsening shortness of breath.  As per family symptoms have been getting worse for past 3 days.  Patient said that she is coughing up yellow phlegm which is very thick.    PT Comments    Patient presents significantly weaker today, severe risk for falls, limited to a few slow labored side steps with difficulty moving BLE due to weakness, poor standing balance and c/o fatigue while on 6 LPM.  Patient SpO2 at 93% after moving up in bed and to fatigued to transfer to chair.      Follow Up Recommendations  SNF     Equipment Recommendations       Recommendations for Other Services       Precautions / Restrictions Precautions Precautions: Fall Restrictions Weight Bearing Restrictions: No    Mobility  Bed Mobility Overal bed mobility: Needs Assistance Bed Mobility: Supine to Sit;Sit to Supine     Supine to sit: Mod assist;Max assist Sit to supine: Mod assist   General bed mobility comments: very slow labored movement  Transfers Overall transfer level: Needs assistance Equipment used: Rolling walker (2 wheeled) Transfers: Sit to/from Stand Sit to Stand: Mod assist;Max assist            Ambulation/Gait Ambulation/Gait assistance: Max assist Gait Distance (Feet): 4 Feet Assistive device: Rolling walker (2 wheeled) Gait Pattern/deviations: Decreased step length - right;Decreased step length - left;Decreased stride length Gait velocity: slow   General Gait Details: limited to 5-6 very short unsteady side steps with diffiuclty moving BLE due to weakness while on 6 LPM  O2   Stairs             Wheelchair Mobility    Modified Rankin (Stroke Patients Only)       Balance Overall balance assessment: Needs assistance Sitting-balance support: Feet supported;Bilateral upper extremity supported Sitting balance-Leahy Scale: Fair     Standing balance support: Bilateral upper extremity supported;During functional activity Standing balance-Leahy Scale: Poor Standing balance comment: using RW                            Cognition Arousal/Alertness: Awake/alert Behavior During Therapy: WFL for tasks assessed/performed Overall Cognitive Status: Within Functional Limits for tasks assessed                                        Exercises      General Comments        Pertinent Vitals/Pain Pain Assessment: Faces Faces Pain Scale: Hurts even more Pain Location: pressure to BLE Pain Descriptors / Indicators: Sore;Grimacing;Guarding Pain Intervention(s): Limited activity within patient's tolerance;Monitored during session    Home Living                      Prior Function            PT Goals (current goals can now be found in the care plan section) Acute Rehab PT Goals Patient Stated Goal: return home, spouse in agreement that patient needs to go to  rehab PT Goal Formulation: With patient/family Time For Goal Achievement: 10/28/18 Potential to Achieve Goals: Good Progress towards PT goals: Not progressing toward goals - comment(very weak with difficulty breathing)    Frequency    Min 3X/week      PT Plan Discharge plan needs to be updated;Current plan remains appropriate    Co-evaluation              AM-PAC PT "6 Clicks" Mobility   Outcome Measure  Help needed turning from your back to your side while in a flat bed without using bedrails?: A Lot Help needed moving from lying on your back to sitting on the side of a flat bed without using bedrails?: A Lot Help needed moving to and  from a bed to a chair (including a wheelchair)?: A Lot Help needed standing up from a chair using your arms (e.g., wheelchair or bedside chair)?: A Lot Help needed to walk in hospital room?: Total Help needed climbing 3-5 steps with a railing? : Total 6 Click Score: 10    End of Session Equipment Utilized During Treatment: Oxygen Activity Tolerance: Patient tolerated treatment well;Patient limited by fatigue Patient left: in bed;with call bell/phone within reach;with family/visitor present Nurse Communication: Mobility status PT Visit Diagnosis: Unsteadiness on feet (R26.81);Other abnormalities of gait and mobility (R26.89);Muscle weakness (generalized) (M62.81)     Time: 2800-3491 PT Time Calculation (min) (ACUTE ONLY): 16 min  Charges:  $Therapeutic Activity: 8-22 mins                     9:27 AM, 10/14/18 Lonell Grandchild, MPT Physical Therapist with [facility] [phone] office [phone] mobile phone

## 2018-10-15 ENCOUNTER — Inpatient Hospital Stay (HOSPITAL_COMMUNITY): Payer: Medicare Other

## 2018-10-15 DIAGNOSIS — R600 Localized edema: Secondary | ICD-10-CM

## 2018-10-15 DIAGNOSIS — I5033 Acute on chronic diastolic (congestive) heart failure: Secondary | ICD-10-CM

## 2018-10-15 LAB — CBC
HCT: 37.9 % (ref 36.0–46.0)
Hemoglobin: 11.4 g/dL — ABNORMAL LOW (ref 12.0–15.0)
MCH: 31 pg (ref 26.0–34.0)
MCHC: 30.1 g/dL (ref 30.0–36.0)
MCV: 103 fL — ABNORMAL HIGH (ref 80.0–100.0)
NRBC: 0 % (ref 0.0–0.2)
Platelets: 295 10*3/uL (ref 150–400)
RBC: 3.68 MIL/uL — ABNORMAL LOW (ref 3.87–5.11)
RDW: 14.3 % (ref 11.5–15.5)
WBC: 9.1 10*3/uL (ref 4.0–10.5)

## 2018-10-15 LAB — RENAL FUNCTION PANEL
Albumin: 3.2 g/dL — ABNORMAL LOW (ref 3.5–5.0)
Anion gap: 8 (ref 5–15)
BUN: 21 mg/dL (ref 8–23)
CO2: 36 mmol/L — ABNORMAL HIGH (ref 22–32)
Calcium: 8.3 mg/dL — ABNORMAL LOW (ref 8.9–10.3)
Chloride: 93 mmol/L — ABNORMAL LOW (ref 98–111)
Creatinine, Ser: 0.74 mg/dL (ref 0.44–1.00)
GFR calc Af Amer: >60 mL/min (ref >60)
GFR calc non Af Amer: >60 mL/min (ref >60)
Glucose, Bld: 132 mg/dL — ABNORMAL HIGH (ref 70–99)
POTASSIUM: 4.3 mmol/L (ref 3.5–5.1)
Phosphorus: 3.4 mg/dL (ref 2.5–4.6)
Sodium: 137 mmol/L (ref 135–145)

## 2018-10-15 LAB — MAGNESIUM: Magnesium: 1.9 mg/dL (ref 1.7–2.4)

## 2018-10-15 MED ORDER — PREDNISONE 20 MG PO TABS
40.0000 mg | ORAL_TABLET | Freq: Every day | ORAL | Status: AC
  Administered 2018-10-15 – 2018-10-19 (×5): 40 mg via ORAL
  Filled 2018-10-15 (×6): qty 2

## 2018-10-15 NOTE — Progress Notes (Signed)
PROGRESS NOTE                                                                                                                                                                                                             Patient Demographics:    [name], is a 85 y.o. female, DOB - [birth year], ZHG:[id number]  Admit date - 10/11/2018   Admitting Physician Oswald Hillock, MD  Outpatient Primary MD for the patient is [name], MD  LOS - 3  Outpatient Specialists: None   Chief Complaint  Patient presents with  . Shortness of Breath        Brief narrative 85 year old female with history of COPD with chronic respiratory failure on 2 L home O2, diastolic CHF, A. fib on Eliquis, presented with worsening shortness of breath secondary to acute exacerbation of COPD and acute on chronic diastolic CHF.      Subjective:   Patient reports her breathing to be slightly better this morning.  Still on 3 and half liter O2 via nasal cannula   Assessment  & Plan :   Principal problem Acute on chronic respiratory failure with hypoxia (HCC) Acute exacerbation of COPD. Continue scheduled DuoNeb, doxycycline.  Not on steroids for reason unclear.  I will add oral prednisone daily.  Still requiring 3.5 L via nasal cannula.  Wean as tolerated.  Continue antitussives.  Active problems Cellulitis of the left lower leg with edema Erythema seems to have improved significantly still has some edema.  Left lower extremity negative for DVT.  Continue doxycycline.  Acute on chronic diastolic CHF.  (HCC) Received IV Lasix and increase oral Lasix dose.  2D echo shows normal EF of 60-65% with no wall motion abnormality.  Negative balance of 1.5 L since admission.  Paroxysmal A. fib Rate controlled.  Continue Eliquis.  Hypothyroidism Continue Synthroid.  Physical deconditioning and protein calorie malnutrition, moderate Continue  nutritional supplement.  PT recommends SNF.   Code Status : DNR  Family Communication  : Is been at bedside  Disposition Plan  : Skilled nursing facility possibly in the next 24-48 hours if respiratory function improved  Barriers For Discharge : Active symptoms  Consults  : None  Procedures  : 2D echo, Doppler lower extremity  DVT Prophylaxis  : Eliquis  Lab Results  Component Value Date  PLT 295 10/15/2018    Antibiotics  :    Anti-infectives (From admission, onward)   Start     Dose/Rate Route Frequency Ordered Stop   10/13/18 1100  doxycycline (VIBRA-TABS) tablet 100 mg     100 mg Oral Every 12 hours 10/13/18 1019     10/12/18 0330  doxycycline (VIBRAMYCIN) 100 mg in sodium chloride 0.9 % 250 mL IVPB  Status:  Discontinued     100 mg 125 mL/hr over 120 Minutes Intravenous Every 12 hours 10/12/18 0302 10/13/18 1019        Objective:   Vitals:   10/14/18 2149 10/15/18 0424 10/15/18 0500 10/15/18 0712  BP: (!) 102/47 133/74    Pulse: 96 87    Resp: 20 20    Temp: 97.6 F (36.4 C) (!) 97.5 F (36.4 C)    TempSrc: Oral Axillary    SpO2: 97% 94%  98%  Weight:   55.6 kg   Height:        Wt Readings from Last 3 Encounters:  10/15/18 55.6 kg  08/17/18 51.7 kg  07/01/18 51.1 kg     Intake/Output Summary (Last 24 hours) at 10/15/2018 1311 Last data filed at 10/15/2018 0900 Gross per 24 hour  Intake 120 ml  Output 850 ml  Net -730 ml     Physical Exam  Gen: Elderly female appears fatigued, not in distress HEENT: no pallor, moist mucosa, supple neck Chest: Scattered rhonchi bilaterally and coarse crackles over the right CVS: S1 and S2 regular, no murmurs GI: soft, NT, ND, BS+ Musculoskeletal: warm, bilateral leg edema (L >R), minimal erythema over the left lower leg CNS: AAOX2-3, nonfocal    Data Review:    CBC Recent Labs  Lab 10/11/18 2106 10/12/18 0426 10/15/18 0544  WBC 8.6 7.1 9.1  HGB 11.7* 11.3* 11.4*  HCT 36.5 36.1 37.9  PLT 330  257 295  MCV 97.3 98.1 103.0*  MCH 31.2 30.7 31.0  MCHC 32.1 31.3 30.1  RDW 14.0 13.8 14.3  LYMPHSABS 1.1  --   --   MONOABS 1.0  --   --   EOSABS 0.2  --   --   BASOSABS 0.0  --   --     Chemistries  Recent Labs  Lab 10/11/18 2106 10/12/18 0426 10/14/18 0513 10/15/18 0544  NA 134* 137 137 137  K 4.1 3.7 3.8 4.3  CL 94* 99 95* 93*  CO2 29 29 34* 36*  GLUCOSE 111* 87 123* 132*  BUN 27* 23 19 21   CREATININE 0.92 0.81 0.82 0.74  CALCIUM 8.5* 8.1* 8.3* 8.3*  MG  --   --   --  1.9  AST 39 29  --   --   ALT 28 24  --   --   ALKPHOS 97 84  --   --   BILITOT 0.6 0.5  --   --    ------------------------------------------------------------------------------------------------------------------ No results for input(s): CHOL, HDL, LDLCALC, TRIG, CHOLHDL, LDLDIRECT in the last 72 hours.  No results found for: HGBA1C ------------------------------------------------------------------------------------------------------------------ No results for input(s): TSH, T4TOTAL, T3FREE, THYROIDAB in the last 72 hours.  Invalid input(s): FREET3 ------------------------------------------------------------------------------------------------------------------ No results for input(s): VITAMINB12, FOLATE, FERRITIN, TIBC, IRON, RETICCTPCT in the last 72 hours.  Coagulation profile No results for input(s): INR, PROTIME in the last 168 hours.  No results for input(s): DDIMER in the last 72 hours.  Cardiac Enzymes Recent Labs  Lab 10/11/18 2106  TROPONINI <0.03   ------------------------------------------------------------------------------------------------------------------  Component Value Date/Time   BNP 390.0 (H) 10/11/2018 2106    Inpatient Medications  Scheduled Meds: . apixaban  2.5 mg Oral BID  . clorazepate  7.5 mg Oral QHS  . diltiazem  360 mg Oral Daily  . doxycycline  100 mg Oral Q12H  . feeding supplement (ENSURE ENLIVE)  237 mL Oral BID BM  . feeding supplement  (PRO-STAT SUGAR FREE 64)  30 mL Oral BID  . furosemide  40 mg Oral BID  . gabapentin  200 mg Oral QHS  . guaiFENesin  600 mg Oral BID  . ipratropium-albuterol  3 mL Nebulization Q6H WA  . levothyroxine  75 mcg Oral Daily   Continuous Infusions: . sodium chloride 10 mL/hr at 10/12/18 0338   PRN Meds:.acetaminophen **OR** acetaminophen, albuterol, clonazePAM, nitroGLYCERIN, ondansetron **OR** ondansetron (ZOFRAN) IV  Micro Results No results found for this or any previous visit (from the past 240 hour(s)).  Radiology Reports Dg Chest 2 View  Result Date: 10/11/2018 CLINICAL DATA:  Worsening shortness of breath for 3 days. Pain all over. History of COPD and CHF. EXAM: CHEST - 2 VIEW COMPARISON:  03/29/2018 FINDINGS: Cardiac enlargement. Bilateral perihilar airspace disease, likely representing edema. Increasing bilateral pleural effusions with basilar atelectasis or consolidation since previous study. Emphysematous changes in the lungs. No pneumothorax. Degenerative changes in the spine. IMPRESSION: Cardiac enlargement. Perihilar edema. Increasing bilateral pleural effusions and basilar atelectasis or consolidation since previous study. Electronically Signed   By: Lucienne Capers M.D.   On: 10/11/2018 22:13   US Venous Img Lower Unilateral Left  Result Date: 10/13/2018 CLINICAL DATA:  Left lower extremity pain and edema. History of fall on 06/28/2018. Evaluate for DVT. EXAM: LEFT LOWER EXTREMITY VENOUS DOPPLER ULTRASOUND TECHNIQUE: Gray-scale sonography with graded compression, as well as color Doppler and duplex ultrasound were performed to evaluate the lower extremity deep venous systems from the level of the common femoral vein and including the common femoral, femoral, profunda femoral, popliteal and calf veins including the posterior tibial, peroneal and gastrocnemius veins when visible. The superficial great saphenous vein was also interrogated. Spectral Doppler was utilized to evaluate  flow at rest and with distal augmentation maneuvers in the common femoral, femoral and popliteal veins. COMPARISON:  None. FINDINGS: Contralateral Common Femoral Vein: Respiratory phasicity is normal and symmetric with the symptomatic side. No evidence of thrombus. Normal compressibility. Common Femoral Vein: No evidence of thrombus. Normal compressibility, respiratory phasicity and response to augmentation. Saphenofemoral Junction: No evidence of thrombus. Normal compressibility and flow on color Doppler imaging. Profunda Femoral Vein: No evidence of thrombus. Normal compressibility and flow on color Doppler imaging. Femoral Vein: No evidence of thrombus. Normal compressibility, respiratory phasicity and response to augmentation. Popliteal Vein: No evidence of thrombus. Normal compressibility, respiratory phasicity and response to augmentation. Calf Veins: No evidence of thrombus. Normal compressibility and flow on color Doppler imaging. Superficial Great Saphenous Vein: No evidence of thrombus. Normal compressibility. Venous Reflux:  None. Other Findings: Note is made of an approximately 1.4 x 2.0 x 0.6 cm serpiginous anechoic fluid collection with the left popliteal fossa compatible with a Baker's cyst. IMPRESSION: 1. No evidence of DVT within the left lower extremity. 2. Incidentally noted approximately 2.0 cm anechoic left-sided Baker's cyst. Electronically Signed   By: Sandi Mariscal M.D.   On: 10/13/2018 13:59   Dg Chest Port 1 View  Result Date: 10/15/2018 CLINICAL DATA:  Acute on chronic diastolic heart failure EXAM: PORTABLE CHEST 1 VIEW COMPARISON:  10/13/2018 FINDINGS: Increased interstitial  markings, favoring mild interstitial edema, unchanged. Small to moderate bilateral pleural effusions, unchanged. No pneumothorax. Cardiomegaly. IMPRESSION: Cardiomegaly with mild interstitial edema and small to moderate bilateral pleural effusions, unchanged. Electronically Signed   By: Julian Hy M.D.   On:  10/15/2018 07:09   Dg Chest Port 1 View  Result Date: 10/13/2018 CLINICAL DATA:  Shortness of breath.  History of COPD. EXAM: PORTABLE CHEST 1 VIEW COMPARISON:  10/11/2018; 03/29/2018; chest CT-03/29/2018 FINDINGS: Interval increase in now likely moderate to large sized bilateral effusions with associated bibasilar consolidative opacities. Bilateral effusions obscure the bilateral heart borders. The pulmonary vasculature appears indistinct with cephalization flow. Minimal biapical pleuroparenchymal thickening. No definite pneumothorax. No acute osseous abnormalities. IMPRESSION: Findings most suggestive of worsening pulmonary edema with increase in moderate to large-sized bilateral effusions and associated worsening bibasilar consolidative opacities, atelectasis versus infiltrate Electronically Signed   By: Sandi Mariscal M.D.   On: 10/13/2018 14:00    Time Spent in minutes 35   Quamir Willemsen M.D on 10/15/2018 at 1:11 PM  Between 7am to 7pm - Pager - [phone]  After 7pm go to [email or web address] - password [facility]  [facility] -  Office  [phone]

## 2018-10-15 NOTE — Progress Notes (Deleted)
PROGRESS NOTE                                                                                                                                                                                                             Patient Demographics:    [name], is a 85 y.o. female, DOB - 10/13/1926, WGN:[id number]  Admit date - 10/11/2018   Admitting Physician Oswald Hillock, MD  Outpatient Primary MD for the patient is [name], MD  LOS - 3  Outpatient Specialists: None   Chief Complaint  Patient presents with  . Shortness of Breath        Brief narrative 85 year old female with history of COPD with chronic respiratory failure on 2 L home O2, diastolic CHF, A. fib on Eliquis, presented with worsening shortness of breath secondary to acute exacerbation of COPD and acute on chronic diastolic CHF.      Subjective:   Patient reports her breathing to be slightly better this morning.  Still on 3 and half liter O2 via nasal cannula   Assessment  & Plan :   Principal problem Acute on chronic respiratory failure with hypoxia (HCC) Acute exacerbation of COPD. Continue scheduled DuoNeb, doxycycline.  Not on steroids for reason unclear.  I will add oral prednisone daily.  Still requiring 3.5 L via nasal cannula.  Wean as tolerated.  Continue antitussives.  Active problems Cellulitis of the left lower leg with edema Erythema seems to have improved significantly still has some edema.  Left lower extremity negative for DVT.  Continue doxycycline.  Acute on chronic diastolic CHF.  (HCC) Received IV Lasix and increase oral Lasix dose.  2D echo shows normal EF of 60-65% with no wall motion abnormality.  Negative balance of 1.5 L since admission.  Paroxysmal A. fib Rate controlled.  Continue Eliquis.  Hypothyroidism Continue Synthroid.  Physical deconditioning and protein calorie malnutrition, moderate Continue  nutritional supplement.  PT recommends SNF.   Code Status : DNR  Family Communication  : Is been at bedside  Disposition Plan  : Skilled nursing facility possibly in the next 24-48 hours if respiratory function improved  Barriers For Discharge : Active symptoms  Consults  : None  Procedures  : 2D echo, Doppler lower extremity  DVT Prophylaxis  : Eliquis  Lab Results  Component Value Date  PLT 295 10/15/2018    Antibiotics  :    Anti-infectives (From admission, onward)   Start     Dose/Rate Route Frequency Ordered Stop   10/13/18 1100  doxycycline (VIBRA-TABS) tablet 100 mg     100 mg Oral Every 12 hours 10/13/18 1019     10/12/18 0330  doxycycline (VIBRAMYCIN) 100 mg in sodium chloride 0.9 % 250 mL IVPB  Status:  Discontinued     100 mg 125 mL/hr over 120 Minutes Intravenous Every 12 hours 10/12/18 0302 10/13/18 1019        Objective:   Vitals:   10/14/18 2149 10/15/18 0424 10/15/18 0500 10/15/18 0712  BP: (!) 102/47 133/74    Pulse: 96 87    Resp: 20 20    Temp: 97.6 F (36.4 C) (!) 97.5 F (36.4 C)    TempSrc: Oral Axillary    SpO2: 97% 94%  98%  Weight:   55.6 kg   Height:        Wt Readings from Last 3 Encounters:  10/15/18 55.6 kg  08/17/18 51.7 kg  07/01/18 51.1 kg     Intake/Output Summary (Last 24 hours) at 10/15/2018 1342 Last data filed at 10/15/2018 0900 Gross per 24 hour  Intake 120 ml  Output 850 ml  Net -730 ml     Physical Exam  Gen: Elderly female appears fatigued, not in distress HEENT: no pallor, moist mucosa, supple neck Chest: Scattered rhonchi bilaterally and coarse crackles over the right CVS: S1 and S2 regular, no murmurs GI: soft, NT, ND, BS+ Musculoskeletal: warm, bilateral leg edema (L >R), minimal erythema over the left lower leg CNS: AAOX2-3, nonfocal    Data Review:    CBC Recent Labs  Lab 10/11/18 2106 10/12/18 0426 10/15/18 0544  WBC 8.6 7.1 9.1  HGB 11.7* 11.3* 11.4*  HCT 36.5 36.1 37.9  PLT 330  257 295  MCV 97.3 98.1 103.0*  MCH 31.2 30.7 31.0  MCHC 32.1 31.3 30.1  RDW 14.0 13.8 14.3  LYMPHSABS 1.1  --   --   MONOABS 1.0  --   --   EOSABS 0.2  --   --   BASOSABS 0.0  --   --     Chemistries  Recent Labs  Lab 10/11/18 2106 10/12/18 0426 10/14/18 0513 10/15/18 0544  NA 134* 137 137 137  K 4.1 3.7 3.8 4.3  CL 94* 99 95* 93*  CO2 29 29 34* 36*  GLUCOSE 111* 87 123* 132*  BUN 27* 23 19 21   CREATININE 0.92 0.81 0.82 0.74  CALCIUM 8.5* 8.1* 8.3* 8.3*  MG  --   --   --  1.9  AST 39 29  --   --   ALT 28 24  --   --   ALKPHOS 97 84  --   --   BILITOT 0.6 0.5  --   --    ------------------------------------------------------------------------------------------------------------------ No results for input(s): CHOL, HDL, LDLCALC, TRIG, CHOLHDL, LDLDIRECT in the last 72 hours.  No results found for: HGBA1C ------------------------------------------------------------------------------------------------------------------ No results for input(s): TSH, T4TOTAL, T3FREE, THYROIDAB in the last 72 hours.  Invalid input(s): FREET3 ------------------------------------------------------------------------------------------------------------------ No results for input(s): VITAMINB12, FOLATE, FERRITIN, TIBC, IRON, RETICCTPCT in the last 72 hours.  Coagulation profile No results for input(s): INR, PROTIME in the last 168 hours.  No results for input(s): DDIMER in the last 72 hours.  Cardiac Enzymes Recent Labs  Lab 10/11/18 2106  TROPONINI <0.03   ------------------------------------------------------------------------------------------------------------------  Component Value Date/Time   BNP 390.0 (H) 10/11/2018 2106    Inpatient Medications  Scheduled Meds: . apixaban  2.5 mg Oral BID  . clorazepate  7.5 mg Oral QHS  . diltiazem  360 mg Oral Daily  . doxycycline  100 mg Oral Q12H  . feeding supplement (ENSURE ENLIVE)  237 mL Oral BID BM  . feeding supplement  (PRO-STAT SUGAR FREE 64)  30 mL Oral BID  . furosemide  40 mg Oral BID  . gabapentin  200 mg Oral QHS  . guaiFENesin  600 mg Oral BID  . ipratropium-albuterol  3 mL Nebulization Q6H WA  . levothyroxine  75 mcg Oral Daily  . predniSONE  40 mg Oral Q breakfast   Continuous Infusions: . sodium chloride 10 mL/hr at 10/12/18 0338   PRN Meds:.acetaminophen **OR** acetaminophen, albuterol, clonazePAM, nitroGLYCERIN, ondansetron **OR** ondansetron (ZOFRAN) IV  Micro Results No results found for this or any previous visit (from the past 240 hour(s)).  Radiology Reports Dg Chest 2 View  Result Date: 10/11/2018 CLINICAL DATA:  Worsening shortness of breath for 3 days. Pain all over. History of COPD and CHF. EXAM: CHEST - 2 VIEW COMPARISON:  03/29/2018 FINDINGS: Cardiac enlargement. Bilateral perihilar airspace disease, likely representing edema. Increasing bilateral pleural effusions with basilar atelectasis or consolidation since previous study. Emphysematous changes in the lungs. No pneumothorax. Degenerative changes in the spine. IMPRESSION: Cardiac enlargement. Perihilar edema. Increasing bilateral pleural effusions and basilar atelectasis or consolidation since previous study. Electronically Signed   By: Lucienne Capers M.D.   On: 10/11/2018 22:13   US Venous Img Lower Unilateral Left  Result Date: 10/13/2018 CLINICAL DATA:  Left lower extremity pain and edema. History of fall on 06/28/2018. Evaluate for DVT. EXAM: LEFT LOWER EXTREMITY VENOUS DOPPLER ULTRASOUND TECHNIQUE: Gray-scale sonography with graded compression, as well as color Doppler and duplex ultrasound were performed to evaluate the lower extremity deep venous systems from the level of the common femoral vein and including the common femoral, femoral, profunda femoral, popliteal and calf veins including the posterior tibial, peroneal and gastrocnemius veins when visible. The superficial great saphenous vein was also interrogated.  Spectral Doppler was utilized to evaluate flow at rest and with distal augmentation maneuvers in the common femoral, femoral and popliteal veins. COMPARISON:  None. FINDINGS: Contralateral Common Femoral Vein: Respiratory phasicity is normal and symmetric with the symptomatic side. No evidence of thrombus. Normal compressibility. Common Femoral Vein: No evidence of thrombus. Normal compressibility, respiratory phasicity and response to augmentation. Saphenofemoral Junction: No evidence of thrombus. Normal compressibility and flow on color Doppler imaging. Profunda Femoral Vein: No evidence of thrombus. Normal compressibility and flow on color Doppler imaging. Femoral Vein: No evidence of thrombus. Normal compressibility, respiratory phasicity and response to augmentation. Popliteal Vein: No evidence of thrombus. Normal compressibility, respiratory phasicity and response to augmentation. Calf Veins: No evidence of thrombus. Normal compressibility and flow on color Doppler imaging. Superficial Great Saphenous Vein: No evidence of thrombus. Normal compressibility. Venous Reflux:  None. Other Findings: Note is made of an approximately 1.4 x 2.0 x 0.6 cm serpiginous anechoic fluid collection with the left popliteal fossa compatible with a Baker's cyst. IMPRESSION: 1. No evidence of DVT within the left lower extremity. 2. Incidentally noted approximately 2.0 cm anechoic left-sided Baker's cyst. Electronically Signed   By: Sandi Mariscal M.D.   On: 10/13/2018 13:59   Dg Chest Port 1 View  Result Date: 10/15/2018 CLINICAL DATA:  Acute on chronic diastolic heart failure EXAM: PORTABLE  CHEST 1 VIEW COMPARISON:  10/13/2018 FINDINGS: Increased interstitial markings, favoring mild interstitial edema, unchanged. Small to moderate bilateral pleural effusions, unchanged. No pneumothorax. Cardiomegaly. IMPRESSION: Cardiomegaly with mild interstitial edema and small to moderate bilateral pleural effusions, unchanged. Electronically  Signed   By: Julian Hy M.D.   On: 10/15/2018 07:09   Dg Chest Port 1 View  Result Date: 10/13/2018 CLINICAL DATA:  Shortness of breath.  History of COPD. EXAM: PORTABLE CHEST 1 VIEW COMPARISON:  10/11/2018; 03/29/2018; chest CT-03/29/2018 FINDINGS: Interval increase in now likely moderate to large sized bilateral effusions with associated bibasilar consolidative opacities. Bilateral effusions obscure the bilateral heart borders. The pulmonary vasculature appears indistinct with cephalization flow. Minimal biapical pleuroparenchymal thickening. No definite pneumothorax. No acute osseous abnormalities. IMPRESSION: Findings most suggestive of worsening pulmonary edema with increase in moderate to large-sized bilateral effusions and associated worsening bibasilar consolidative opacities, atelectasis versus infiltrate Electronically Signed   By: Sandi Mariscal M.D.   On: 10/13/2018 14:00    Time Spent in minutes 35   Nishi Neiswonger M.D on 10/15/2018 at 1:42 PM  Between 7am to 7pm - Pager - [phone]  After 7pm go to [email or web address] - password [facility]  [facility] -  Office  [phone]

## 2018-10-16 DIAGNOSIS — E873 Alkalosis: Secondary | ICD-10-CM

## 2018-10-16 LAB — BASIC METABOLIC PANEL
Anion gap: 8 (ref 5–15)
BUN: 20 mg/dL (ref 8–23)
CHLORIDE: 89 mmol/L — AB (ref 98–111)
CO2: 41 mmol/L — ABNORMAL HIGH (ref 22–32)
Calcium: 8.5 mg/dL — ABNORMAL LOW (ref 8.9–10.3)
Creatinine, Ser: 0.69 mg/dL (ref 0.44–1.00)
GFR calc Af Amer: >60 mL/min (ref >60)
GFR calc non Af Amer: >60 mL/min (ref >60)
Glucose, Bld: 137 mg/dL — ABNORMAL HIGH (ref 70–99)
Potassium: 3.8 mmol/L (ref 3.5–5.1)
Sodium: 138 mmol/L (ref 135–145)

## 2018-10-16 NOTE — Progress Notes (Addendum)
PROGRESS NOTE                                                                                                                                                                                                             Patient Demographics:    [name], is a 85 y.o. female, DOB - 1927/01/19, YYT:[id number]  Admit date - 10/11/2018   Admitting Physician Oswald Hillock, MD  Outpatient Primary MD for the patient is [name], MD  LOS - 4  Outpatient Specialists: None   Chief Complaint  Patient presents with  . Shortness of Breath        Brief narrative 85 year old female with history of COPD with chronic respiratory failure on 2 L home O2, diastolic CHF, A. fib on Eliquis, presented with worsening shortness of breath secondary to acute exacerbation of COPD and acute on chronic diastolic CHF.      Subjective:   Breathing better.  Feels tired   Assessment  & Plan :   Principal problem Acute on chronic respiratory failure with hypoxia (HCC) Acute exacerbation of COPD. Continue scheduled DuoNeb, doxycycline.  Continue daily oral prednisone..  Wean oxygen to 2 L.    Continue antitussives.  Active problems Cellulitis of the left lower leg with edema Improved.  Left lower extremity negative for DVT.  Continue doxycycline.  Acute on chronic diastolic CHF.  (HCC) Received IV Lasix and oral Lasix dose was increased.  2D echo shows normal EF of 60-65% with no wall motion abnormality.  Negative balance of 2.5 L since admission. CO2 elevated to 41 this morning causing metabolic alkalosis.  I will discontinue further Lasix dose and repeat labs in a.m.  Paroxysmal A. fib Rate controlled.  Continue Eliquis.  Hypothyroidism Continue Synthroid.  Physical deconditioning and protein calorie malnutrition, moderate Continue nutritional supplement.  PT recommends SNF.   Code Status : DNR  Family Communication  :  Husband at bedside  Disposition Plan  : Skilled nursing facility possibly tomorrow.  Barriers For Discharge : Active symptoms  Consults  : None  Procedures  : 2D echo, Doppler lower extremity  DVT Prophylaxis  : Eliquis  Lab Results  Component Value Date   PLT 295 10/15/2018    Antibiotics  :    Anti-infectives (From admission, onward)   Start     Dose/Rate Route  Frequency Ordered Stop   10/13/18 1100  doxycycline (VIBRA-TABS) tablet 100 mg     100 mg Oral Every 12 hours 10/13/18 1019     10/12/18 0330  doxycycline (VIBRAMYCIN) 100 mg in sodium chloride 0.9 % 250 mL IVPB  Status:  Discontinued     100 mg 125 mL/hr over 120 Minutes Intravenous Every 12 hours 10/12/18 0302 10/13/18 1019        Objective:   Vitals:   10/15/18 2007 10/15/18 2231 10/16/18 0554 10/16/18 0821  BP:  134/77 133/73   Pulse: 87 91 92   Resp: 18 20 20    Temp:  98.1 F (36.7 C) (!) 97.5 F (36.4 C)   TempSrc:  Oral Oral   SpO2: 97% 98% 100% 97%  Weight:      Height:        Wt Readings from Last 3 Encounters:  10/15/18 55.6 kg  08/17/18 51.7 kg  07/01/18 51.1 kg     Intake/Output Summary (Last 24 hours) at 10/16/2018 1105 Last data filed at 10/16/2018 0700 Gross per 24 hour  Intake 240 ml  Output 1200 ml  Net -960 ml    Physical exam Elderly thin built female, appears fatigued HEENT: Moist mucosa, supple neck Chest: Improved crackles over right lung CVs: S1-S2 regular, no murmurs GI: Soft, nondistended, nontender Musculoskeletal: Warm, mild edema over the left leg with markedly improved erythema CNS: Alert and oriented, nonfocal     Data Review:    CBC Recent Labs  Lab 10/11/18 2106 10/12/18 0426 10/15/18 0544  WBC 8.6 7.1 9.1  HGB 11.7* 11.3* 11.4*  HCT 36.5 36.1 37.9  PLT 330 257 295  MCV 97.3 98.1 103.0*  MCH 31.2 30.7 31.0  MCHC 32.1 31.3 30.1  RDW 14.0 13.8 14.3  LYMPHSABS 1.1  --   --   MONOABS 1.0  --   --   EOSABS 0.2  --   --   BASOSABS 0.0  --   --      Chemistries  Recent Labs  Lab 10/11/18 2106 10/12/18 0426 10/14/18 0513 10/15/18 0544 10/16/18 0701  NA 134* 137 137 137 138  K 4.1 3.7 3.8 4.3 3.8  CL 94* 99 95* 93* 89*  CO2 29 29 34* 36* 41*  GLUCOSE 111* 87 123* 132* 137*  BUN 27* 23 19 21 20   CREATININE 0.92 0.81 0.82 0.74 0.69  CALCIUM 8.5* 8.1* 8.3* 8.3* 8.5*  MG  --   --   --  1.9  --   AST 39 29  --   --   --   ALT 28 24  --   --   --   ALKPHOS 97 84  --   --   --   BILITOT 0.6 0.5  --   --   --    ------------------------------------------------------------------------------------------------------------------ No results for input(s): CHOL, HDL, LDLCALC, TRIG, CHOLHDL, LDLDIRECT in the last 72 hours.  No results found for: HGBA1C ------------------------------------------------------------------------------------------------------------------ No results for input(s): TSH, T4TOTAL, T3FREE, THYROIDAB in the last 72 hours.  Invalid input(s): FREET3 ------------------------------------------------------------------------------------------------------------------ No results for input(s): VITAMINB12, FOLATE, FERRITIN, TIBC, IRON, RETICCTPCT in the last 72 hours.  Coagulation profile No results for input(s): INR, PROTIME in the last 168 hours.  No results for input(s): DDIMER in the last 72 hours.  Cardiac Enzymes Recent Labs  Lab 10/11/18 2106  TROPONINI <0.03   ------------------------------------------------------------------------------------------------------------------    Component Value Date/Time   BNP 390.0 (H) 10/11/2018 2106    Inpatient  Medications  Scheduled Meds: . apixaban  2.5 mg Oral BID  . clorazepate  7.5 mg Oral QHS  . diltiazem  360 mg Oral Daily  . doxycycline  100 mg Oral Q12H  . feeding supplement (ENSURE ENLIVE)  237 mL Oral BID BM  . feeding supplement (PRO-STAT SUGAR FREE 64)  30 mL Oral BID  . furosemide  40 mg Oral BID  . gabapentin  200 mg Oral QHS  . guaiFENesin  600  mg Oral BID  . ipratropium-albuterol  3 mL Nebulization Q6H WA  . levothyroxine  75 mcg Oral Daily  . predniSONE  40 mg Oral Q breakfast   Continuous Infusions: . sodium chloride 10 mL/hr at 10/12/18 0338   PRN Meds:.acetaminophen **OR** acetaminophen, albuterol, clonazePAM, nitroGLYCERIN, ondansetron **OR** ondansetron (ZOFRAN) IV  Micro Results No results found for this or any previous visit (from the past 240 hour(s)).  Radiology Reports Dg Chest 2 View  Result Date: 10/11/2018 CLINICAL DATA:  Worsening shortness of breath for 3 days. Pain all over. History of COPD and CHF. EXAM: CHEST - 2 VIEW COMPARISON:  03/29/2018 FINDINGS: Cardiac enlargement. Bilateral perihilar airspace disease, likely representing edema. Increasing bilateral pleural effusions with basilar atelectasis or consolidation since previous study. Emphysematous changes in the lungs. No pneumothorax. Degenerative changes in the spine. IMPRESSION: Cardiac enlargement. Perihilar edema. Increasing bilateral pleural effusions and basilar atelectasis or consolidation since previous study. Electronically Signed   By: Lucienne Capers M.D.   On: 10/11/2018 22:13   US Venous Img Lower Unilateral Left  Result Date: 10/13/2018 CLINICAL DATA:  Left lower extremity pain and edema. History of fall on 06/28/2018. Evaluate for DVT. EXAM: LEFT LOWER EXTREMITY VENOUS DOPPLER ULTRASOUND TECHNIQUE: Gray-scale sonography with graded compression, as well as color Doppler and duplex ultrasound were performed to evaluate the lower extremity deep venous systems from the level of the common femoral vein and including the common femoral, femoral, profunda femoral, popliteal and calf veins including the posterior tibial, peroneal and gastrocnemius veins when visible. The superficial great saphenous vein was also interrogated. Spectral Doppler was utilized to evaluate flow at rest and with distal augmentation maneuvers in the common femoral, femoral and  popliteal veins. COMPARISON:  None. FINDINGS: Contralateral Common Femoral Vein: Respiratory phasicity is normal and symmetric with the symptomatic side. No evidence of thrombus. Normal compressibility. Common Femoral Vein: No evidence of thrombus. Normal compressibility, respiratory phasicity and response to augmentation. Saphenofemoral Junction: No evidence of thrombus. Normal compressibility and flow on color Doppler imaging. Profunda Femoral Vein: No evidence of thrombus. Normal compressibility and flow on color Doppler imaging. Femoral Vein: No evidence of thrombus. Normal compressibility, respiratory phasicity and response to augmentation. Popliteal Vein: No evidence of thrombus. Normal compressibility, respiratory phasicity and response to augmentation. Calf Veins: No evidence of thrombus. Normal compressibility and flow on color Doppler imaging. Superficial Great Saphenous Vein: No evidence of thrombus. Normal compressibility. Venous Reflux:  None. Other Findings: Note is made of an approximately 1.4 x 2.0 x 0.6 cm serpiginous anechoic fluid collection with the left popliteal fossa compatible with a Baker's cyst. IMPRESSION: 1. No evidence of DVT within the left lower extremity. 2. Incidentally noted approximately 2.0 cm anechoic left-sided Baker's cyst. Electronically Signed   By: Sandi Mariscal M.D.   On: 10/13/2018 13:59   Dg Chest Port 1 View  Result Date: 10/15/2018 CLINICAL DATA:  Acute on chronic diastolic heart failure EXAM: PORTABLE CHEST 1 VIEW COMPARISON:  10/13/2018 FINDINGS: Increased interstitial markings, favoring mild interstitial edema,  unchanged. Small to moderate bilateral pleural effusions, unchanged. No pneumothorax. Cardiomegaly. IMPRESSION: Cardiomegaly with mild interstitial edema and small to moderate bilateral pleural effusions, unchanged. Electronically Signed   By: Julian Hy M.D.   On: 10/15/2018 07:09   Dg Chest Port 1 View  Result Date: 10/13/2018 CLINICAL DATA:   Shortness of breath.  History of COPD. EXAM: PORTABLE CHEST 1 VIEW COMPARISON:  10/11/2018; 03/29/2018; chest CT-03/29/2018 FINDINGS: Interval increase in now likely moderate to large sized bilateral effusions with associated bibasilar consolidative opacities. Bilateral effusions obscure the bilateral heart borders. The pulmonary vasculature appears indistinct with cephalization flow. Minimal biapical pleuroparenchymal thickening. No definite pneumothorax. No acute osseous abnormalities. IMPRESSION: Findings most suggestive of worsening pulmonary edema with increase in moderate to large-sized bilateral effusions and associated worsening bibasilar consolidative opacities, atelectasis versus infiltrate Electronically Signed   By: Sandi Mariscal M.D.   On: 10/13/2018 14:00    Time Spent in minutes 25   Graysyn Bache M.D on 10/16/2018 at 11:05 AM  Between 7am to 7pm - Pager - [phone]  After 7pm go to [email or web address] - password [facility]  [facility] -  Office  [phone]

## 2018-10-17 DIAGNOSIS — J9601 Acute respiratory failure with hypoxia: Secondary | ICD-10-CM

## 2018-10-17 DIAGNOSIS — G9340 Encephalopathy, unspecified: Secondary | ICD-10-CM

## 2018-10-17 LAB — BLOOD GAS, ARTERIAL
Acid-Base Excess: 20 mmol/L — ABNORMAL HIGH (ref 0.0–2.0)
Bicarbonate: 42 mmol/L — ABNORMAL HIGH (ref 20.0–28.0)
Drawn by: 22223
FIO2: 28
O2 Saturation: 97.8 %
Patient temperature: 37
pCO2 arterial: 78.3 mmHg (ref 32.0–48.0)
pH, Arterial: 7.389 (ref 7.350–7.450)
pO2, Arterial: 102 mmHg (ref 83.0–108.0)

## 2018-10-17 LAB — BASIC METABOLIC PANEL
Anion gap: 8 (ref 5–15)
BUN: 27 mg/dL — ABNORMAL HIGH (ref 8–23)
CO2: 42 mmol/L — ABNORMAL HIGH (ref 22–32)
Calcium: 8.6 mg/dL — ABNORMAL LOW (ref 8.9–10.3)
Chloride: 89 mmol/L — ABNORMAL LOW (ref 98–111)
Creatinine, Ser: 0.67 mg/dL (ref 0.44–1.00)
GFR calc Af Amer: >60 mL/min (ref >60)
GFR calc non Af Amer: >60 mL/min (ref >60)
Glucose, Bld: 100 mg/dL — ABNORMAL HIGH (ref 70–99)
Potassium: 3.8 mmol/L (ref 3.5–5.1)
Sodium: 139 mmol/L (ref 135–145)

## 2018-10-17 LAB — GLUCOSE, CAPILLARY: Glucose-Capillary: 145 mg/dL — ABNORMAL HIGH (ref 70–99)

## 2018-10-17 NOTE — Progress Notes (Signed)
CRITICAL VALUE ALERT  Critical Value:  PCO2 78.3  Date & Time Notied:  10-17-2018 2015  Provider Notified: Baltazar Najjar  Orders Received/Actions taken: no new orders at the present

## 2018-10-17 NOTE — Progress Notes (Signed)
Pt refuse to wear BIPAP, husband removed mask. Pt placed  back on 2lpm cann

## 2018-10-17 NOTE — Progress Notes (Signed)
PT Cancellation Note  Patient Details Name: [name] MRN: [id number] DOB: 1926/11/09   Cancelled Treatment:    Reason Eval/Treat Not Completed: Fatigue/lethargy limiting ability to participate. Pt sleeping, able to arouse and answer questions reporting neck pain, but pt with difficulty staying awake. Pt's spouse reports she had tylenol for pain and is sleeping due to the medication. Will check on pt later if time permits.   1:03 PM, 10/17/18 Talbot Grumbling, DPT Physical Therapist with [facility] [phone] office

## 2018-10-17 NOTE — Progress Notes (Signed)
PROGRESS NOTE                                                                                                                                                                                                             Patient Demographics:    [name], is a 85 y.o. female, DOB - [birth year], GHW:[id number]  Admit date - 10/11/2018   Admitting Physician Oswald Hillock, MD  Outpatient Primary MD for the patient is [name], MD  LOS - 5  Outpatient Specialists: None   Chief Complaint  Patient presents with  . Shortness of Breath        Brief narrative 85 year old female with history of COPD with chronic respiratory failure on 2 L home O2, diastolic CHF, A. fib on Eliquis, presented with worsening shortness of breath secondary to acute exacerbation of COPD and acute on chronic diastolic CHF.      Subjective:   She is sleeping on my arrival today. Wakes up to voice and answers questions. Husband is answering most of questions. Feels shortness of breath is improving. Falls back asleep during visit.   Assessment  & Plan :   Principal problem Acute on chronic respiratory failure with hypoxia (HCC) Acute exacerbation of COPD. Continue scheduled DuoNeb, doxycycline.  Continue daily oral prednisone..  Wean oxygen to 2 L.    Continue antitussives.  Active problems Cellulitis of the left lower leg with edema Improved.  Left lower extremity negative for DVT.  Continue doxycycline.  Acute on chronic diastolic CHF.  (HCC) Received IV Lasix and oral Lasix dose was increased.  2D echo shows normal EF of 60-65% with no wall motion abnormality.  Negative balance of 2.5 L since admission. CO2 elevated to 41 this morning causing metabolic alkalosis.  I will discontinue further Lasix dose and repeat labs in a.m.  Acute encephalopathy Patient is lethargic today. She does not have any focal deficits. Will check ABG   Paroxysmal A. fib Rate controlled.  Continue Eliquis.  Hypothyroidism Continue Synthroid.  Physical deconditioning and protein calorie malnutrition, moderate Continue nutritional supplement.  PT recommends SNF.   Code Status : DNR  Family Communication  : Husband at bedside  Disposition Plan  : Skilled nursing facility on discharge. Several social issues around patient's discharge with her husband and his safety of discharge. APS involved.  Barriers  For Discharge : Active symptoms  Consults  : None  Procedures  : 2D echo, Doppler lower extremity  DVT Prophylaxis  : Eliquis  Lab Results  Component Value Date   PLT 295 10/15/2018    Antibiotics  :    Anti-infectives (From admission, onward)   Start     Dose/Rate Route Frequency Ordered Stop   10/13/18 1100  doxycycline (VIBRA-TABS) tablet 100 mg     100 mg Oral Every 12 hours 10/13/18 1019     10/12/18 0330  doxycycline (VIBRAMYCIN) 100 mg in sodium chloride 0.9 % 250 mL IVPB  Status:  Discontinued     100 mg 125 mL/hr over 120 Minutes Intravenous Every 12 hours 10/12/18 0302 10/13/18 1019        Objective:   Vitals:   10/17/18 0452 10/17/18 0500 10/17/18 0743 10/17/18 1307  BP: (!) 141/73   138/72  Pulse: 95   87  Resp: 18   18  Temp: (!) 97.2 F (36.2 C)   (!) 97.5 F (36.4 C)  TempSrc: Oral   Oral  SpO2: 99%  99% 94%  Weight:  53.4 kg    Height:        Wt Readings from Last 3 Encounters:  10/17/18 53.4 kg  08/17/18 51.7 kg  07/01/18 51.1 kg     Intake/Output Summary (Last 24 hours) at 10/17/2018 1925 Last data filed at 10/17/2018 1814 Gross per 24 hour  Intake 480 ml  Output 1250 ml  Net -770 ml    Physical exam General exam: somnolent, wakes up to voice and answers questions and then falls back asleep Respiratory system: crackles at bases. Respiratory effort normal. Cardiovascular system:RRR. No murmurs, rubs, gallops. Gastrointestinal system: Abdomen is nondistended, soft and nontender. No  organomegaly or masses felt. Normal bowel sounds heard. Central nervous system:  No focal neurological deficits. Extremities: erythema in LLE, swelling in LLE>RLE Skin: No rashes, lesions or ulcers Psychiatry: somnolent, wakes up to voice       Data Review:    CBC Recent Labs  Lab 10/11/18 2106 10/12/18 0426 10/15/18 0544  WBC 8.6 7.1 9.1  HGB 11.7* 11.3* 11.4*  HCT 36.5 36.1 37.9  PLT 330 257 295  MCV 97.3 98.1 103.0*  MCH 31.2 30.7 31.0  MCHC 32.1 31.3 30.1  RDW 14.0 13.8 14.3  LYMPHSABS 1.1  --   --   MONOABS 1.0  --   --   EOSABS 0.2  --   --   BASOSABS 0.0  --   --     Chemistries  Recent Labs  Lab 10/11/18 2106 10/12/18 0426 10/14/18 0513 10/15/18 0544 10/16/18 0701 10/17/18 0619  NA 134* 137 137 137 138 139  K 4.1 3.7 3.8 4.3 3.8 3.8  CL 94* 99 95* 93* 89* 89*  CO2 29 29 34* 36* 41* 42*  GLUCOSE 111* 87 123* 132* 137* 100*  BUN 27* 23 19 21 20  27*  CREATININE 0.92 0.81 0.82 0.74 0.69 0.67  CALCIUM 8.5* 8.1* 8.3* 8.3* 8.5* 8.6*  MG  --   --   --  1.9  --   --   AST 39 29  --   --   --   --   ALT 28 24  --   --   --   --   ALKPHOS 97 84  --   --   --   --   BILITOT 0.6 0.5  --   --   --   --    ------------------------------------------------------------------------------------------------------------------  No results for input(s): CHOL, HDL, LDLCALC, TRIG, CHOLHDL, LDLDIRECT in the last 72 hours.  No results found for: HGBA1C ------------------------------------------------------------------------------------------------------------------ No results for input(s): TSH, T4TOTAL, T3FREE, THYROIDAB in the last 72 hours.  Invalid input(s): FREET3 ------------------------------------------------------------------------------------------------------------------ No results for input(s): VITAMINB12, FOLATE, FERRITIN, TIBC, IRON, RETICCTPCT in the last 72 hours.  Coagulation profile No results for input(s): INR, PROTIME in the last 168 hours.  No  results for input(s): DDIMER in the last 72 hours.  Cardiac Enzymes Recent Labs  Lab 10/11/18 2106  TROPONINI <0.03   ------------------------------------------------------------------------------------------------------------------    Component Value Date/Time   BNP 390.0 (H) 10/11/2018 2106    Inpatient Medications  Scheduled Meds: . apixaban  2.5 mg Oral BID  . clorazepate  7.5 mg Oral QHS  . diltiazem  360 mg Oral Daily  . doxycycline  100 mg Oral Q12H  . feeding supplement (ENSURE ENLIVE)  237 mL Oral BID BM  . feeding supplement (PRO-STAT SUGAR FREE 64)  30 mL Oral BID  . gabapentin  200 mg Oral QHS  . guaiFENesin  600 mg Oral BID  . ipratropium-albuterol  3 mL Nebulization Q6H WA  . levothyroxine  75 mcg Oral Daily  . predniSONE  40 mg Oral Q breakfast   Continuous Infusions: . sodium chloride 10 mL/hr at 10/12/18 0338   PRN Meds:.acetaminophen **OR** acetaminophen, albuterol, clonazePAM, nitroGLYCERIN, ondansetron **OR** ondansetron (ZOFRAN) IV  Micro Results No results found for this or any previous visit (from the past 240 hour(s)).  Radiology Reports Dg Chest 2 View  Result Date: 10/11/2018 CLINICAL DATA:  Worsening shortness of breath for 3 days. Pain all over. History of COPD and CHF. EXAM: CHEST - 2 VIEW COMPARISON:  03/29/2018 FINDINGS: Cardiac enlargement. Bilateral perihilar airspace disease, likely representing edema. Increasing bilateral pleural effusions with basilar atelectasis or consolidation since previous study. Emphysematous changes in the lungs. No pneumothorax. Degenerative changes in the spine. IMPRESSION: Cardiac enlargement. Perihilar edema. Increasing bilateral pleural effusions and basilar atelectasis or consolidation since previous study. Electronically Signed   By: Lucienne Capers M.D.   On: 10/11/2018 22:13   US Venous Img Lower Unilateral Left  Result Date: 10/13/2018 CLINICAL DATA:  Left lower extremity pain and edema. History of fall  on 06/28/2018. Evaluate for DVT. EXAM: LEFT LOWER EXTREMITY VENOUS DOPPLER ULTRASOUND TECHNIQUE: Gray-scale sonography with graded compression, as well as color Doppler and duplex ultrasound were performed to evaluate the lower extremity deep venous systems from the level of the common femoral vein and including the common femoral, femoral, profunda femoral, popliteal and calf veins including the posterior tibial, peroneal and gastrocnemius veins when visible. The superficial great saphenous vein was also interrogated. Spectral Doppler was utilized to evaluate flow at rest and with distal augmentation maneuvers in the common femoral, femoral and popliteal veins. COMPARISON:  None. FINDINGS: Contralateral Common Femoral Vein: Respiratory phasicity is normal and symmetric with the symptomatic side. No evidence of thrombus. Normal compressibility. Common Femoral Vein: No evidence of thrombus. Normal compressibility, respiratory phasicity and response to augmentation. Saphenofemoral Junction: No evidence of thrombus. Normal compressibility and flow on color Doppler imaging. Profunda Femoral Vein: No evidence of thrombus. Normal compressibility and flow on color Doppler imaging. Femoral Vein: No evidence of thrombus. Normal compressibility, respiratory phasicity and response to augmentation. Popliteal Vein: No evidence of thrombus. Normal compressibility, respiratory phasicity and response to augmentation. Calf Veins: No evidence of thrombus. Normal compressibility and flow on color Doppler imaging. Superficial Great Saphenous Vein: No evidence of thrombus. Normal  compressibility. Venous Reflux:  None. Other Findings: Note is made of an approximately 1.4 x 2.0 x 0.6 cm serpiginous anechoic fluid collection with the left popliteal fossa compatible with a Baker's cyst. IMPRESSION: 1. No evidence of DVT within the left lower extremity. 2. Incidentally noted approximately 2.0 cm anechoic left-sided Baker's cyst.  Electronically Signed   By: Sandi Mariscal M.D.   On: 10/13/2018 13:59   Dg Chest Port 1 View  Result Date: 10/15/2018 CLINICAL DATA:  Acute on chronic diastolic heart failure EXAM: PORTABLE CHEST 1 VIEW COMPARISON:  10/13/2018 FINDINGS: Increased interstitial markings, favoring mild interstitial edema, unchanged. Small to moderate bilateral pleural effusions, unchanged. No pneumothorax. Cardiomegaly. IMPRESSION: Cardiomegaly with mild interstitial edema and small to moderate bilateral pleural effusions, unchanged. Electronically Signed   By: Julian Hy M.D.   On: 10/15/2018 07:09   Dg Chest Port 1 View  Result Date: 10/13/2018 CLINICAL DATA:  Shortness of breath.  History of COPD. EXAM: PORTABLE CHEST 1 VIEW COMPARISON:  10/11/2018; 03/29/2018; chest CT-03/29/2018 FINDINGS: Interval increase in now likely moderate to large sized bilateral effusions with associated bibasilar consolidative opacities. Bilateral effusions obscure the bilateral heart borders. The pulmonary vasculature appears indistinct with cephalization flow. Minimal biapical pleuroparenchymal thickening. No definite pneumothorax. No acute osseous abnormalities. IMPRESSION: Findings most suggestive of worsening pulmonary edema with increase in moderate to large-sized bilateral effusions and associated worsening bibasilar consolidative opacities, atelectasis versus infiltrate Electronically Signed   By: Sandi Mariscal M.D.   On: 10/13/2018 14:00    Time Spent in minutes 25   Kathie Dike M.D on 10/17/2018 at 7:25 PM  Between 7am to 7pm   After 7pm go to [email or web address]   [facility] -  Office  [phone]

## 2018-10-18 LAB — BASIC METABOLIC PANEL
Anion gap: 8 (ref 5–15)
BUN: 28 mg/dL — ABNORMAL HIGH (ref 8–23)
CO2: 42 mmol/L — AB (ref 22–32)
Calcium: 8.6 mg/dL — ABNORMAL LOW (ref 8.9–10.3)
Chloride: 88 mmol/L — ABNORMAL LOW (ref 98–111)
Creatinine, Ser: 0.58 mg/dL (ref 0.44–1.00)
GFR calc Af Amer: >60 mL/min (ref >60)
GFR calc non Af Amer: >60 mL/min (ref >60)
GLUCOSE: 116 mg/dL — AB (ref 70–99)
Potassium: 3.8 mmol/L (ref 3.5–5.1)
Sodium: 138 mmol/L (ref 135–145)

## 2018-10-18 NOTE — Progress Notes (Signed)
Patient is still unable to wear her BIPAP at night c/o that she can't breathe with it on. Patient remains on 2lpm  and resting comfortably. RT/nursing will continue to monitor patient through the shift.

## 2018-10-18 NOTE — Progress Notes (Signed)
Physical Therapy Treatment Patient Details Name: EBONEY CLAYBROOK MRN: [id number] DOB: 1926-09-26 Today's Date: 10/18/2018    History of Present Illness Diamon Reddinger  is a 85 y.o. female, with history of paroxysmal atrial fibrillation on anticoagulation with Eliquis, diastolic CHF, COPD, coronary disease, pernicious anemia, chronic respiratory failure on 2 L oxygen at home, came to hospital with worsening shortness of breath.  As per family symptoms have been getting worse for past 3 days.  Patient said that she is coughing up yellow phlegm which is very thick.    PT Comments    Patient presents supine in bed and agreeable to therapy with encouragement to increase activity level and to sit up and eat breakfast. Pt c/o neck and BLE pain, but agreeable to get up to bedside chair in hopes to improve discomfort. Pt on 2 LPM O2 and O2 sat 99% supine in bed, 98-100% while seated at EOB, and 95-99% throughout transfer training and while seated in bedside chair. Pt with slow, labored movement throughout treatment session requiring increased time and steadying assistance due to poor balance. Pt tolerates steps at bedside using RW, mod assist and verbal cues for sequencing. Pt able to complete sit to stand reps from EOB with RW, with dizziness on first rep but not on subsequent reps. Pt tolerates treatment well on 2 LPM without desaturation, educated on pursed lip breathing to maintain oxygenation and able to remain up in chair with call bell in lap, spouse at bedside, and breakfast tray positioned in front of pt. Nurse tech notified of pt's performance level with therapy today and verbalized understanding. Patient will benefit from continued physical therapy in hospital and recommended venue below to increase strength, balance, endurance for safe ADLs and gait.    Follow Up Recommendations  SNF     Equipment Recommendations  None recommended by PT    Recommendations for Other Services        Precautions / Restrictions Precautions Precautions: Fall Restrictions Weight Bearing Restrictions: No    Mobility  Bed Mobility Overal bed mobility: Needs Assistance Bed Mobility: Supine to Sit     Supine to sit: Mod assist     General bed mobility comments: slow, labored movement requiring increased time and use of bed rail with elevated HOB, assist to bring BLE to EOB and to upright trunk  Transfers Overall transfer level: Needs assistance Equipment used: Rolling walker (2 wheeled) Transfers: Sit to/from [facility] Sit to Stand: Mod assist Stand pivot transfers: Mod assist       General transfer comment: unsteadiness upon standing requiring mod assist to steady pt, verbal cues and min assist to manage RW for pivoting  Ambulation/Gait Ambulation/Gait assistance: Mod assist Gait Distance (Feet): 3 Feet Assistive device: Rolling walker (2 wheeled) Gait Pattern/deviations: Decreased step length - right;Decreased step length - left;Decreased stride length;Shuffle Gait velocity: decreased   General Gait Details: labored, unsteady 4-5 steps at bedside with difficulty moving BLE secondary to weakness, on 2LPM and O2 sat 95-99% with activity   Stairs             Wheelchair Mobility    Modified Rankin (Stroke Patients Only)       Balance Overall balance assessment: Needs assistance Sitting-balance support: Feet supported;Bilateral upper extremity supported Sitting balance-Leahy Scale: Fair Sitting balance - Comments: seated EOB   Standing balance support: Bilateral upper extremity supported;During functional activity Standing balance-Leahy Scale: Poor Standing balance comment: using RW  Cognition Arousal/Alertness: Awake/alert Behavior During Therapy: WFL for tasks assessed/performed Overall Cognitive Status: Within Functional Limits for tasks assessed                                         Exercises      General Comments        Pertinent Vitals/Pain Pain Assessment: Faces Faces Pain Scale: Hurts little more Pain Location: neck and pressure to BLE Pain Descriptors / Indicators: Sore;Grimacing;Guarding Pain Intervention(s): Limited activity within patient's tolerance;Monitored during session;Repositioned    Home Living                      Prior Function            PT Goals (current goals can now be found in the care plan section) Acute Rehab PT Goals Patient Stated Goal: return home, spouse in agreement that patient needs to go to rehab PT Goal Formulation: With patient Time For Goal Achievement: 10/28/18 Potential to Achieve Goals: Good Progress towards PT goals: Progressing toward goals(improved breathing and O2 saturation on 2 LPM)    Frequency    Min 3X/week      PT Plan Discharge plan needs to be updated;Current plan remains appropriate    Co-evaluation              AM-PAC PT "6 Clicks" Mobility   Outcome Measure  Help needed turning from your back to your side while in a flat bed without using bedrails?: A Lot Help needed moving from lying on your back to sitting on the side of a flat bed without using bedrails?: A Lot Help needed moving to and from a bed to a chair (including a wheelchair)?: A Lot Help needed standing up from a chair using your arms (e.g., wheelchair or bedside chair)?: A Lot Help needed to walk in hospital room?: A Lot Help needed climbing 3-5 steps with a railing? : Total 6 Click Score: 11    End of Session Equipment Utilized During Treatment: Gait belt;Oxygen(2 LPM) Activity Tolerance: Patient tolerated treatment well;Patient limited by fatigue Patient left: in chair;with call bell/phone within reach;with family/visitor present(spouse at bedside) Nurse Communication: Mobility status PT Visit Diagnosis: Unsteadiness on feet (R26.81);Other abnormalities of gait and mobility (R26.89);Muscle weakness  (generalized) (M62.81)     Time: 7356-7014 PT Time Calculation (min) (ACUTE ONLY): 25 min  Charges:  $Therapeutic Activity: 8-22 mins                     11:09 AM, 10/18/18 Talbot Grumbling, DPT Physical Therapist with [facility] [phone] office

## 2018-10-18 NOTE — Progress Notes (Signed)
Patient's level of care was changed from med surg to step down due to patient needed bipap related to PCO2 of 78.3. Pt husband took bipap off the patient and she refuses to wear. Contacted Dr. Myna Hidalgo to have level of care changed back to med surg

## 2018-10-18 NOTE — Progress Notes (Signed)
PROGRESS NOTE                                                                                                                                                                                                             Patient Demographics:    [name], is a 85 y.o. female, DOB - 05-10-1927, SEG:[id number]  Admit date - 10/11/2018   Admitting Physician Oswald Hillock, MD  Outpatient Primary MD for the patient is [name], MD  LOS - 6  Outpatient Specialists: None   Chief Complaint  Patient presents with  . Shortness of Breath        Brief narrative 85 year old female with history of COPD with chronic respiratory failure on 2 L home O2, diastolic CHF, A. fib on Eliquis, presented with worsening shortness of breath secondary to acute exacerbation of COPD and acute on chronic diastolic CHF.      Subjective:   Patient is sleeping, wakes up to voice.  Answers questions appropriately.  Remembers being put on BiPAP last night but immediately remove the mass saying that she could not tolerate it.  She does not wish to have this reapplied.   Assessment  & Plan :   Principal problem Acute on chronic respiratory failure with hypoxia and hypercapnia (HCC) Acute exacerbation of COPD. Continue scheduled DuoNeb, doxycycline.  Continue daily oral prednisone.  Wean oxygen to 2 L.    Continue antitussives.  Patient refuses any noninvasive positive pressure therapy.  Active problems Cellulitis of the left lower leg with edema Improving.  Left lower extremity negative for DVT.  Continue doxycycline.  Acute on chronic diastolic CHF.  ([place]) I suspect she mostly has right-sided heart failure.  Received IV Lasix and oral Lasix dose was increased.  2D echo shows normal EF of 60-65% with no wall motion abnormality.  Negative balance of 2.5 L since admission. CO2 elevated to 41 this morning causing metabolic alkalosis.  I  will discontinue further Lasix dose and repeat labs in a.m.  Acute encephalopathy Patient was lethargic yesterday.  ABG indicated hypercapnia.  pH appeared to be compensated.  She was placed on BiPAP, but refused to wear this overnight.  She continues to refuse any BiPAP.  She does wake up to voice and answers questions appropriately.  She does repeatedly fall asleep.  Paroxysmal A. fib Rate controlled.  Continue Eliquis.  Hypothyroidism Continue Synthroid.  Physical deconditioning and protein calorie malnutrition, moderate Continue nutritional supplement.  PT recommends SNF.   Code Status : DNR  Family Communication  : Husband at bedside  Disposition Plan  : Skilled nursing facility on discharge. Several social issues around patient's discharge with her husband and his safety of discharge. APS involved.  Barriers For Discharge : Active symptoms  Consults  : None  Procedures  : 2D echo, Doppler lower extremity  DVT Prophylaxis  : Eliquis  Lab Results  Component Value Date   PLT 295 10/15/2018    Antibiotics  :    Anti-infectives (From admission, onward)   Start     Dose/Rate Route Frequency Ordered Stop   10/13/18 1100  doxycycline (VIBRA-TABS) tablet 100 mg     100 mg Oral Every 12 hours 10/13/18 1019     10/12/18 0330  doxycycline (VIBRAMYCIN) 100 mg in sodium chloride 0.9 % 250 mL IVPB  Status:  Discontinued     100 mg 125 mL/hr over 120 Minutes Intravenous Every 12 hours 10/12/18 0302 10/13/18 1019        Objective:   Vitals:   10/18/18 0514 10/18/18 0900 10/18/18 1433 10/18/18 1947  BP: 140/65   127/64  Pulse: 87   91  Resp: 18   20  Temp: 97.7 F (36.5 C)   (!) 97.5 F (36.4 C)  TempSrc:    Oral  SpO2: 97% 99% (!) 67% 100%  Weight: 51.2 kg     Height:        Wt Readings from Last 3 Encounters:  10/18/18 51.2 kg  08/17/18 51.7 kg  07/01/18 51.1 kg     Intake/Output Summary (Last 24 hours) at 10/18/2018 2059 Last data filed at 10/18/2018 0514  Gross per 24 hour  Intake -  Output 600 ml  Net -600 ml    Physical exam General exam: somnolent, wakes up to voice and answers questions and then falls back asleep Respiratory system: CTA B. Respiratory effort normal. Cardiovascular system:RRR. No murmurs, rubs, gallops. Gastrointestinal system: Abdomen is nondistended, soft and nontender. No organomegaly or masses felt. Normal bowel sounds heard. Central nervous system:  No focal neurological deficits. Extremities: erythema in LLE is improving, swelling in LLE>RLE Skin: No rashes, lesions or ulcers Psychiatry: somnolent, wakes up to voice       Data Review:    CBC Recent Labs  Lab 10/11/18 2106 10/12/18 0426 10/15/18 0544  WBC 8.6 7.1 9.1  HGB 11.7* 11.3* 11.4*  HCT 36.5 36.1 37.9  PLT 330 257 295  MCV 97.3 98.1 103.0*  MCH 31.2 30.7 31.0  MCHC 32.1 31.3 30.1  RDW 14.0 13.8 14.3  LYMPHSABS 1.1  --   --   MONOABS 1.0  --   --   EOSABS 0.2  --   --   BASOSABS 0.0  --   --     Chemistries  Recent Labs  Lab 10/11/18 2106 10/12/18 0426 10/14/18 0513 10/15/18 0544 10/16/18 0701 10/17/18 0619 10/18/18 0444  NA 134* 137 137 137 138 139 138  K 4.1 3.7 3.8 4.3 3.8 3.8 3.8  CL 94* 99 95* 93* 89* 89* 88*  CO2 29 29 34* 36* 41* 42* 42*  GLUCOSE 111* 87 123* 132* 137* 100* 116*  BUN 27* 23 19 21 20  27* 28*  CREATININE 0.92 0.81 0.82 0.74 0.69 0.67 0.58  CALCIUM 8.5* 8.1* 8.3* 8.3* 8.5* 8.6* 8.6*  MG  --   --   --  1.9  --   --   --   AST 39 29  --   --   --   --   --   ALT 28 24  --   --   --   --   --   ALKPHOS 97 84  --   --   --   --   --   BILITOT 0.6 0.5  --   --   --   --   --    ------------------------------------------------------------------------------------------------------------------ No results for input(s): CHOL, HDL, LDLCALC, TRIG, CHOLHDL, LDLDIRECT in the last 72 hours.  No results found for: HGBA1C  ------------------------------------------------------------------------------------------------------------------ No results for input(s): TSH, T4TOTAL, T3FREE, THYROIDAB in the last 72 hours.  Invalid input(s): FREET3 ------------------------------------------------------------------------------------------------------------------ No results for input(s): VITAMINB12, FOLATE, FERRITIN, TIBC, IRON, RETICCTPCT in the last 72 hours.  Coagulation profile No results for input(s): INR, PROTIME in the last 168 hours.  No results for input(s): DDIMER in the last 72 hours.  Cardiac Enzymes Recent Labs  Lab 10/11/18 2106  TROPONINI <0.03   ------------------------------------------------------------------------------------------------------------------    Component Value Date/Time   BNP 390.0 (H) 10/11/2018 2106    Inpatient Medications  Scheduled Meds: . apixaban  2.5 mg Oral BID  . clorazepate  7.5 mg Oral QHS  . diltiazem  360 mg Oral Daily  . doxycycline  100 mg Oral Q12H  . feeding supplement (ENSURE ENLIVE)  237 mL Oral BID BM  . feeding supplement (PRO-STAT SUGAR FREE 64)  30 mL Oral BID  . gabapentin  200 mg Oral QHS  . guaiFENesin  600 mg Oral BID  . ipratropium-albuterol  3 mL Nebulization Q6H WA  . levothyroxine  75 mcg Oral Daily  . predniSONE  40 mg Oral Q breakfast   Continuous Infusions: . sodium chloride 10 mL/hr at 10/12/18 0338   PRN Meds:.acetaminophen **OR** acetaminophen, albuterol, clonazePAM, nitroGLYCERIN, ondansetron **OR** ondansetron (ZOFRAN) IV  Micro Results No results found for this or any previous visit (from the past 240 hour(s)).  Radiology Reports Dg Chest 2 View  Result Date: 10/11/2018 CLINICAL DATA:  Worsening shortness of breath for 3 days. Pain all over. History of COPD and CHF. EXAM: CHEST - 2 VIEW COMPARISON:  03/29/2018 FINDINGS: Cardiac enlargement. Bilateral perihilar airspace disease, likely representing edema. Increasing  bilateral pleural effusions with basilar atelectasis or consolidation since previous study. Emphysematous changes in the lungs. No pneumothorax. Degenerative changes in the spine. IMPRESSION: Cardiac enlargement. Perihilar edema. Increasing bilateral pleural effusions and basilar atelectasis or consolidation since previous study. Electronically Signed   By: Lucienne Capers M.D.   On: 10/11/2018 22:13   US Venous Img Lower Unilateral Left  Result Date: 10/13/2018 CLINICAL DATA:  Left lower extremity pain and edema. History of fall on 06/28/2018. Evaluate for DVT. EXAM: LEFT LOWER EXTREMITY VENOUS DOPPLER ULTRASOUND TECHNIQUE: Gray-scale sonography with graded compression, as well as color Doppler and duplex ultrasound were performed to evaluate the lower extremity deep venous systems from the level of the common femoral vein and including the common femoral, femoral, profunda femoral, popliteal and calf veins including the posterior tibial, peroneal and gastrocnemius veins when visible. The superficial great saphenous vein was also interrogated. Spectral Doppler was utilized to evaluate flow at rest and with distal augmentation maneuvers in the common femoral, femoral and popliteal veins. COMPARISON:  None. FINDINGS: Contralateral Common Femoral Vein: Respiratory phasicity is normal and symmetric with the symptomatic side. No evidence of thrombus. Normal compressibility. Common Femoral Vein: No evidence of  thrombus. Normal compressibility, respiratory phasicity and response to augmentation. Saphenofemoral Junction: No evidence of thrombus. Normal compressibility and flow on color Doppler imaging. Profunda Femoral Vein: No evidence of thrombus. Normal compressibility and flow on color Doppler imaging. Femoral Vein: No evidence of thrombus. Normal compressibility, respiratory phasicity and response to augmentation. Popliteal Vein: No evidence of thrombus. Normal compressibility, respiratory phasicity and response  to augmentation. Calf Veins: No evidence of thrombus. Normal compressibility and flow on color Doppler imaging. Superficial Great Saphenous Vein: No evidence of thrombus. Normal compressibility. Venous Reflux:  None. Other Findings: Note is made of an approximately 1.4 x 2.0 x 0.6 cm serpiginous anechoic fluid collection with the left popliteal fossa compatible with a Baker's cyst. IMPRESSION: 1. No evidence of DVT within the left lower extremity. 2. Incidentally noted approximately 2.0 cm anechoic left-sided Baker's cyst. Electronically Signed   By: Sandi Mariscal M.D.   On: 10/13/2018 13:59   Dg Chest Port 1 View  Result Date: 10/15/2018 CLINICAL DATA:  Acute on chronic diastolic heart failure EXAM: PORTABLE CHEST 1 VIEW COMPARISON:  10/13/2018 FINDINGS: Increased interstitial markings, favoring mild interstitial edema, unchanged. Small to moderate bilateral pleural effusions, unchanged. No pneumothorax. Cardiomegaly. IMPRESSION: Cardiomegaly with mild interstitial edema and small to moderate bilateral pleural effusions, unchanged. Electronically Signed   By: Julian Hy M.D.   On: 10/15/2018 07:09   Dg Chest Port 1 View  Result Date: 10/13/2018 CLINICAL DATA:  Shortness of breath.  History of COPD. EXAM: PORTABLE CHEST 1 VIEW COMPARISON:  10/11/2018; 03/29/2018; chest CT-03/29/2018 FINDINGS: Interval increase in now likely moderate to large sized bilateral effusions with associated bibasilar consolidative opacities. Bilateral effusions obscure the bilateral heart borders. The pulmonary vasculature appears indistinct with cephalization flow. Minimal biapical pleuroparenchymal thickening. No definite pneumothorax. No acute osseous abnormalities. IMPRESSION: Findings most suggestive of worsening pulmonary edema with increase in moderate to large-sized bilateral effusions and associated worsening bibasilar consolidative opacities, atelectasis versus infiltrate Electronically Signed   By: Sandi Mariscal M.D.    On: 10/13/2018 14:00    Time Spent in minutes 25   Kathie Dike M.D on 10/18/2018 at 8:59 PM  Between 7am to 7pm   After 7pm go to [email or web address]   [facility] -  Office  [phone]

## 2018-10-19 DIAGNOSIS — L03116 Cellulitis of left lower limb: Secondary | ICD-10-CM

## 2018-10-19 DIAGNOSIS — I480 Paroxysmal atrial fibrillation: Secondary | ICD-10-CM

## 2018-10-19 LAB — CBC
HCT: 38.4 % (ref 36.0–46.0)
Hemoglobin: 11.4 g/dL — ABNORMAL LOW (ref 12.0–15.0)
MCH: 30.2 pg (ref 26.0–34.0)
MCHC: 29.7 g/dL — ABNORMAL LOW (ref 30.0–36.0)
MCV: 101.9 fL — ABNORMAL HIGH (ref 80.0–100.0)
Platelets: 257 10*3/uL (ref 150–400)
RBC: 3.77 MIL/uL — ABNORMAL LOW (ref 3.87–5.11)
RDW: 13.7 % (ref 11.5–15.5)
WBC: 7.3 10*3/uL (ref 4.0–10.5)
nRBC: 0 % (ref 0.0–0.2)

## 2018-10-19 LAB — BASIC METABOLIC PANEL
Anion gap: 7 (ref 5–15)
BUN: 25 mg/dL — ABNORMAL HIGH (ref 8–23)
CO2: 43 mmol/L — ABNORMAL HIGH (ref 22–32)
Calcium: 8.3 mg/dL — ABNORMAL LOW (ref 8.9–10.3)
Chloride: 89 mmol/L — ABNORMAL LOW (ref 98–111)
Creatinine, Ser: 0.57 mg/dL (ref 0.44–1.00)
GFR calc non Af Amer: >60 mL/min (ref >60)
Glucose, Bld: 97 mg/dL (ref 70–99)
Potassium: 4.1 mmol/L (ref 3.5–5.1)
Sodium: 139 mmol/L (ref 135–145)

## 2018-10-19 MED ORDER — POLYETHYLENE GLYCOL 3350 17 G PO PACK
17.0000 g | PACK | Freq: Every day | ORAL | Status: DC | PRN
  Administered 2018-10-20: 17 g via ORAL
  Filled 2018-10-19: qty 1

## 2018-10-19 NOTE — Progress Notes (Signed)
PROGRESS NOTE                                                                                                                                                                                                             Patient Demographics:    [name], is a 85 y.o. female, DOB - [birth year], FXO:[id number]  Admit date - 10/11/2018   Admitting Physician Oswald Hillock, MD  Outpatient Primary MD for the patient is [name], MD  LOS - 7  Outpatient Specialists: None   Chief Complaint  Patient presents with  . Shortness of Breath        Brief narrative 85 year old female with history of COPD with chronic respiratory failure on 2 L home O2, diastolic CHF, A. fib on Eliquis, presented with worsening shortness of breath secondary to acute exacerbation of COPD and acute on chronic diastolic CHF.      Subjective:   Denies any significant complaints.  She does feel weak.  Denies any shortness of breath.   Assessment  & Plan :   Principal problem Acute on chronic respiratory failure with hypoxia and hypercapnia (HCC) Acute exacerbation of COPD. Continue scheduled DuoNeb, doxycycline.  She is completed a course of prednisone.  Wean oxygen to 2 L.    Continue antitussives.  Patient refuses any noninvasive positive pressure therapy.  Active problems Cellulitis of the left lower leg with edema Improving.  Left lower extremity negative for DVT.  Continue doxycycline.  Acute on chronic diastolic CHF.  ([place]) I suspect she mostly has right-sided heart failure.  Received IV Lasix and oral Lasix dose was increased.  2D echo shows normal EF of 60-65% with no wall motion abnormality.  Negative balance of 4.5 L since admission. Since CO2 was trending up, Lasix has been held.   Acute encephalopathy Patient was lethargic yesterday.  ABG indicated hypercapnia.  pH appeared to be compensated.  She was placed on BiPAP, but  refused to wear this overnight.  She continues to refuse any BiPAP.  She does wake up to voice and answers questions appropriately.  She does repeatedly fall asleep.  Paroxysmal A. fib Rate controlled.  Continue Eliquis.  Hypothyroidism Continue Synthroid.  Physical deconditioning and protein calorie malnutrition, moderate Continue nutritional supplement.  PT recommends SNF.   Code Status : DNR  Family Communication  :  Husband at bedside  Disposition Plan  : Skilled nursing facility on discharge. Several social issues around patient's discharge with her husband and his safety of discharge. APS involved.  Barriers For Discharge : Active symptoms  Consults  : None  Procedures  : 2D echo, Doppler lower extremity  DVT Prophylaxis  : Eliquis  Lab Results  Component Value Date   PLT 257 10/19/2018    Antibiotics  :    Anti-infectives (From admission, onward)   Start     Dose/Rate Route Frequency Ordered Stop   10/13/18 1100  doxycycline (VIBRA-TABS) tablet 100 mg     100 mg Oral Every 12 hours 10/13/18 1019     10/12/18 0330  doxycycline (VIBRAMYCIN) 100 mg in sodium chloride 0.9 % 250 mL IVPB  Status:  Discontinued     100 mg 125 mL/hr over 120 Minutes Intravenous Every 12 hours 10/12/18 0302 10/13/18 1019        Objective:   Vitals:   10/19/18 0619 10/19/18 0733 10/19/18 1416 10/19/18 1434  BP: (!) 129/118  (!) 104/51   Pulse: (!) 104  67   Resp: 16  16   Temp: 99.5 F (37.5 C)  98.9 F (37.2 C)   TempSrc:      SpO2: 99% 99% 100% 99%  Weight:      Height:        Wt Readings from Last 3 Encounters:  10/19/18 53.1 kg  08/17/18 51.7 kg  07/01/18 51.1 kg     Intake/Output Summary (Last 24 hours) at 10/19/2018 1813 Last data filed at 10/19/2018 1300 Gross per 24 hour  Intake 720 ml  Output 800 ml  Net -80 ml    Physical exam General exam: Awake alert, not in any distress Respiratory system: Clear bilaterally. Respiratory effort normal. Cardiovascular  system:RRR. No murmurs, rubs, gallops. Gastrointestinal system: Abdomen is nondistended, soft and nontender. No organomegaly or masses felt. Normal bowel sounds heard. Central nervous system:  No focal neurological deficits. Extremities: Erythema left lower extremity is improved.  Swelling left lower extremity greater than right lower extremity Skin: No rashes, lesions or ulcers Psychiatry: Pleasant, cooperative      Data Review:    CBC Recent Labs  Lab 10/15/18 0544 10/19/18 0533  WBC 9.1 7.3  HGB 11.4* 11.4*  HCT 37.9 38.4  PLT 295 257  MCV 103.0* 101.9*  MCH 31.0 30.2  MCHC 30.1 29.7*  RDW 14.3 13.7    Chemistries  Recent Labs  Lab 10/15/18 0544 10/16/18 0701 10/17/18 0619 10/18/18 0444 10/19/18 0533  NA 137 138 139 138 139  K 4.3 3.8 3.8 3.8 4.1  CL 93* 89* 89* 88* 89*  CO2 36* 41* 42* 42* 43*  GLUCOSE 132* 137* 100* 116* 97  BUN 21 20 27* 28* 25*  CREATININE 0.74 0.69 0.67 0.58 0.57  CALCIUM 8.3* 8.5* 8.6* 8.6* 8.3*  MG 1.9  --   --   --   --    ------------------------------------------------------------------------------------------------------------------ No results for input(s): CHOL, HDL, LDLCALC, TRIG, CHOLHDL, LDLDIRECT in the last 72 hours.  No results found for: HGBA1C ------------------------------------------------------------------------------------------------------------------ No results for input(s): TSH, T4TOTAL, T3FREE, THYROIDAB in the last 72 hours.  Invalid input(s): FREET3 ------------------------------------------------------------------------------------------------------------------ No results for input(s): VITAMINB12, FOLATE, FERRITIN, TIBC, IRON, RETICCTPCT in the last 72 hours.  Coagulation profile No results for input(s): INR, PROTIME in the last 168 hours.  No results for input(s): DDIMER in the last 72 hours.  Cardiac Enzymes No results for input(s): CKMB,  TROPONINI, MYOGLOBIN in the last 168 hours.  Invalid input(s):  CK ------------------------------------------------------------------------------------------------------------------    Component Value Date/Time   BNP 390.0 (H) 10/11/2018 2106    Inpatient Medications  Scheduled Meds: . apixaban  2.5 mg Oral BID  . clorazepate  7.5 mg Oral QHS  . diltiazem  360 mg Oral Daily  . doxycycline  100 mg Oral Q12H  . feeding supplement (ENSURE ENLIVE)  237 mL Oral BID BM  . feeding supplement (PRO-STAT SUGAR FREE 64)  30 mL Oral BID  . gabapentin  200 mg Oral QHS  . guaiFENesin  600 mg Oral BID  . ipratropium-albuterol  3 mL Nebulization Q6H WA  . levothyroxine  75 mcg Oral Daily   Continuous Infusions: . sodium chloride 10 mL/hr at 10/12/18 0338   PRN Meds:.acetaminophen **OR** acetaminophen, albuterol, clonazePAM, nitroGLYCERIN, ondansetron **OR** ondansetron (ZOFRAN) IV  Micro Results No results found for this or any previous visit (from the past 240 hour(s)).  Radiology Reports Dg Chest 2 View  Result Date: 10/11/2018 CLINICAL DATA:  Worsening shortness of breath for 3 days. Pain all over. History of COPD and CHF. EXAM: CHEST - 2 VIEW COMPARISON:  03/29/2018 FINDINGS: Cardiac enlargement. Bilateral perihilar airspace disease, likely representing edema. Increasing bilateral pleural effusions with basilar atelectasis or consolidation since previous study. Emphysematous changes in the lungs. No pneumothorax. Degenerative changes in the spine. IMPRESSION: Cardiac enlargement. Perihilar edema. Increasing bilateral pleural effusions and basilar atelectasis or consolidation since previous study. Electronically Signed   By: Lucienne Capers M.D.   On: 10/11/2018 22:13   US Venous Img Lower Unilateral Left  Result Date: 10/13/2018 CLINICAL DATA:  Left lower extremity pain and edema. History of fall on 06/28/2018. Evaluate for DVT. EXAM: LEFT LOWER EXTREMITY VENOUS DOPPLER ULTRASOUND TECHNIQUE: Gray-scale sonography with graded compression, as well as color  Doppler and duplex ultrasound were performed to evaluate the lower extremity deep venous systems from the level of the common femoral vein and including the common femoral, femoral, profunda femoral, popliteal and calf veins including the posterior tibial, peroneal and gastrocnemius veins when visible. The superficial great saphenous vein was also interrogated. Spectral Doppler was utilized to evaluate flow at rest and with distal augmentation maneuvers in the common femoral, femoral and popliteal veins. COMPARISON:  None. FINDINGS: Contralateral Common Femoral Vein: Respiratory phasicity is normal and symmetric with the symptomatic side. No evidence of thrombus. Normal compressibility. Common Femoral Vein: No evidence of thrombus. Normal compressibility, respiratory phasicity and response to augmentation. Saphenofemoral Junction: No evidence of thrombus. Normal compressibility and flow on color Doppler imaging. Profunda Femoral Vein: No evidence of thrombus. Normal compressibility and flow on color Doppler imaging. Femoral Vein: No evidence of thrombus. Normal compressibility, respiratory phasicity and response to augmentation. Popliteal Vein: No evidence of thrombus. Normal compressibility, respiratory phasicity and response to augmentation. Calf Veins: No evidence of thrombus. Normal compressibility and flow on color Doppler imaging. Superficial Great Saphenous Vein: No evidence of thrombus. Normal compressibility. Venous Reflux:  None. Other Findings: Note is made of an approximately 1.4 x 2.0 x 0.6 cm serpiginous anechoic fluid collection with the left popliteal fossa compatible with a Baker's cyst. IMPRESSION: 1. No evidence of DVT within the left lower extremity. 2. Incidentally noted approximately 2.0 cm anechoic left-sided Baker's cyst. Electronically Signed   By: Sandi Mariscal M.D.   On: 10/13/2018 13:59   Dg Chest Port 1 View  Result Date: 10/15/2018 CLINICAL DATA:  Acute on chronic diastolic heart  failure EXAM: PORTABLE CHEST  1 VIEW COMPARISON:  10/13/2018 FINDINGS: Increased interstitial markings, favoring mild interstitial edema, unchanged. Small to moderate bilateral pleural effusions, unchanged. No pneumothorax. Cardiomegaly. IMPRESSION: Cardiomegaly with mild interstitial edema and small to moderate bilateral pleural effusions, unchanged. Electronically Signed   By: Julian Hy M.D.   On: 10/15/2018 07:09   Dg Chest Port 1 View  Result Date: 10/13/2018 CLINICAL DATA:  Shortness of breath.  History of COPD. EXAM: PORTABLE CHEST 1 VIEW COMPARISON:  10/11/2018; 03/29/2018; chest CT-03/29/2018 FINDINGS: Interval increase in now likely moderate to large sized bilateral effusions with associated bibasilar consolidative opacities. Bilateral effusions obscure the bilateral heart borders. The pulmonary vasculature appears indistinct with cephalization flow. Minimal biapical pleuroparenchymal thickening. No definite pneumothorax. No acute osseous abnormalities. IMPRESSION: Findings most suggestive of worsening pulmonary edema with increase in moderate to large-sized bilateral effusions and associated worsening bibasilar consolidative opacities, atelectasis versus infiltrate Electronically Signed   By: Sandi Mariscal M.D.   On: 10/13/2018 14:00    Time Spent in minutes 25   Kathie Dike M.D on 10/19/2018 at 6:13 PM  Between 7am to 7pm   After 7pm go to [email or web address]   [facility] -  Office  [phone]

## 2018-10-19 NOTE — Progress Notes (Signed)
Physical Therapy Treatment Patient Details Name: [name] MRN: [id number] DOB: 03/30/1927 Today's Date: 10/19/2018    History of Present Illness [name]  is a 85 y.o. female, with history of paroxysmal atrial fibrillation on anticoagulation with Eliquis, diastolic CHF, COPD, coronary disease, pernicious anemia, chronic respiratory failure on 2 L oxygen at home, came to hospital with worsening shortness of breath.  As per family symptoms have been getting worse for past 3 days.  Patient said that she is coughing up yellow phlegm which is very thick.    PT Comments    Patient presents supine in bed, complaining of "stomach pain" as she rubs her stomach and says she is "nasueas and cramping", but is agreeable to therapy with much encouargement. Pt is limited secondary to low motivation requiring constant encouragement and education on participation to improve functional mobility and ability to discharge. Pt agreeable to sit at EOB and perform seated exercises with physical demonstration and verbal cues. Pt on 2 LPM and O2 sat 92-99% with all activities, no shortness of breath noted, and no increase in stomach pain. Pt able to take steps at bedside while using RW, but not wanting to ambulate further distance secondary to being "too tired". Pt left supine in bed, spouse at bedside, and RN notified of pt's participation level and stomach pain. Patient will benefit from continued physical therapy in hospital and recommended venue below to increase strength, balance, endurance for safe ADLs and gait.    Follow Up Recommendations  SNF     Equipment Recommendations  None recommended by PT    Recommendations for Other Services       Precautions / Restrictions Precautions Precautions: Fall Restrictions Weight Bearing Restrictions: No    Mobility  Bed Mobility Overal bed mobility: Needs Assistance Bed Mobility: Supine to Sit;Sit to Supine     Supine to sit: Min assist Sit to  supine: Min assist   General bed mobility comments: slow, labored movement, use of bed rail with elevated HOB, uprighting trunk assistance for supine to sit and BLE assist to raise into bed with sit to supine  Transfers Overall transfer level: Needs assistance Equipment used: Rolling walker (2 wheeled) Transfers: Sit to/from Stand Sit to Stand: Min assist         General transfer comment: verbal cues for hand placement and sequencing, unsteadiness in standing  Ambulation/Gait Ambulation/Gait assistance: Mod assist Gait Distance (Feet): 3 Feet Assistive device: Rolling walker (2 wheeled) Gait Pattern/deviations: Decreased step length - right;Decreased step length - left;Decreased stride length;Shuffle;Trunk flexed Gait velocity: decreased   General Gait Details: labored, unsteady sidesteps steps at bedside with with flexed trunk and shuffling step progression, on 2LPM and O2 sat 92-97% with activity   Stairs             Wheelchair Mobility    Modified Rankin (Stroke Patients Only)       Balance Overall balance assessment: Needs assistance Sitting-balance support: Feet supported;Bilateral upper extremity supported Sitting balance-Leahy Scale: Fair Sitting balance - Comments: seated EOB   Standing balance support: Bilateral upper extremity supported;During functional activity Standing balance-Leahy Scale: Poor Standing balance comment: using RW                            Cognition Arousal/Alertness: Awake/alert Behavior During Therapy: WFL for tasks assessed/performed Overall Cognitive Status: Within Functional Limits for tasks assessed  General Comments: Initially lethargic, improving to awake and alert with mobility      Exercises General Exercises - Lower Extremity Long Arc Quad: Seated;AROM;Strengthening;Both;10 reps Hip Flexion/Marching: Seated;AROM;Strengthening;Both;10 reps    General  Comments        Pertinent Vitals/Pain Pain Score: 8  Pain Location: stomach Pain Descriptors / Indicators: Aching;Cramping Pain Intervention(s): Limited activity within patient's tolerance;Monitored during session;Repositioned;Patient requesting pain meds-RN notified    Home Living                      Prior Function            PT Goals (current goals can now be found in the care plan section) Acute Rehab PT Goals Patient Stated Goal: return home, spouse in agreement that patient needs to go to rehab PT Goal Formulation: With patient Time For Goal Achievement: 10/28/18 Potential to Achieve Goals: Good Progress towards PT goals: Progressing toward goals    Frequency    Min 3X/week      PT Plan Current plan remains appropriate    Co-evaluation              AM-PAC PT "6 Clicks" Mobility   Outcome Measure  Help needed turning from your back to your side while in a flat bed without using bedrails?: A Lot Help needed moving from lying on your back to sitting on the side of a flat bed without using bedrails?: A Lot Help needed moving to and from a bed to a chair (including a wheelchair)?: A Lot Help needed standing up from a chair using your arms (e.g., wheelchair or bedside chair)?: A Lot Help needed to walk in hospital room?: A Lot Help needed climbing 3-5 steps with a railing? : Total 6 Click Score: 11    End of Session Equipment Utilized During Treatment: Gait belt;Oxygen(2 LPM) Activity Tolerance: Patient tolerated treatment well;Patient limited by fatigue;Patient limited by pain Patient left: in bed;with call bell/phone within reach;with family/visitor present(spouse at bedside) Nurse Communication: Mobility status PT Visit Diagnosis: Unsteadiness on feet (R26.81);Other abnormalities of gait and mobility (R26.89);Muscle weakness (generalized) (M62.81)     Time: 8250-5397 PT Time Calculation (min) (ACUTE ONLY): 21 min  Charges:  $Therapeutic  Activity: 8-22 mins                     1:08 PM, 10/19/18 Talbot Grumbling, DPT Physical Therapist with [facility] [phone] office

## 2018-10-19 NOTE — Progress Notes (Signed)
Patient refusing to wear BIPAP at this time.

## 2018-10-20 ENCOUNTER — Inpatient Hospital Stay
Admission: RE | Admit: 2018-10-20 | Discharge: 2018-11-02 | Disposition: A | Discharge status: Home / Self Care | Payer: Medicare Other | Source: Ambulatory Visit | Attending: Internal Medicine | Admitting: Internal Medicine

## 2018-10-20 DIAGNOSIS — Z9981 Dependence on supplemental oxygen: Secondary | ICD-10-CM | POA: Diagnosis not present

## 2018-10-20 DIAGNOSIS — J40 Bronchitis, not specified as acute or chronic: Secondary | ICD-10-CM | POA: Diagnosis not present

## 2018-10-20 DIAGNOSIS — Z7901 Long term (current) use of anticoagulants: Secondary | ICD-10-CM | POA: Diagnosis not present

## 2018-10-20 DIAGNOSIS — J9621 Acute and chronic respiratory failure with hypoxia: Secondary | ICD-10-CM | POA: Diagnosis not present

## 2018-10-20 DIAGNOSIS — I4891 Unspecified atrial fibrillation: Secondary | ICD-10-CM | POA: Diagnosis not present

## 2018-10-20 DIAGNOSIS — R262 Difficulty in walking, not elsewhere classified: Secondary | ICD-10-CM | POA: Diagnosis not present

## 2018-10-20 DIAGNOSIS — M6281 Muscle weakness (generalized): Secondary | ICD-10-CM | POA: Diagnosis not present

## 2018-10-20 DIAGNOSIS — J449 Chronic obstructive pulmonary disease, unspecified: Secondary | ICD-10-CM | POA: Diagnosis not present

## 2018-10-20 DIAGNOSIS — E785 Hyperlipidemia, unspecified: Secondary | ICD-10-CM | POA: Diagnosis not present

## 2018-10-20 DIAGNOSIS — M810 Age-related osteoporosis without current pathological fracture: Secondary | ICD-10-CM | POA: Diagnosis not present

## 2018-10-20 DIAGNOSIS — R41841 Cognitive communication deficit: Secondary | ICD-10-CM | POA: Diagnosis not present

## 2018-10-20 DIAGNOSIS — R600 Localized edema: Secondary | ICD-10-CM | POA: Diagnosis not present

## 2018-10-20 DIAGNOSIS — R634 Abnormal weight loss: Secondary | ICD-10-CM | POA: Diagnosis not present

## 2018-10-20 DIAGNOSIS — D510 Vitamin B12 deficiency anemia due to intrinsic factor deficiency: Secondary | ICD-10-CM | POA: Diagnosis not present

## 2018-10-20 DIAGNOSIS — J9622 Acute and chronic respiratory failure with hypercapnia: Secondary | ICD-10-CM | POA: Diagnosis not present

## 2018-10-20 DIAGNOSIS — L03119 Cellulitis of unspecified part of limb: Secondary | ICD-10-CM

## 2018-10-20 DIAGNOSIS — F411 Generalized anxiety disorder: Secondary | ICD-10-CM | POA: Diagnosis not present

## 2018-10-20 DIAGNOSIS — R112 Nausea with vomiting, unspecified: Secondary | ICD-10-CM | POA: Diagnosis not present

## 2018-10-20 DIAGNOSIS — E038 Other specified hypothyroidism: Secondary | ICD-10-CM | POA: Diagnosis not present

## 2018-10-20 DIAGNOSIS — J441 Chronic obstructive pulmonary disease with (acute) exacerbation: Secondary | ICD-10-CM | POA: Diagnosis not present

## 2018-10-20 DIAGNOSIS — I2510 Atherosclerotic heart disease of native coronary artery without angina pectoris: Secondary | ICD-10-CM | POA: Diagnosis not present

## 2018-10-20 DIAGNOSIS — J479 Bronchiectasis, uncomplicated: Secondary | ICD-10-CM | POA: Diagnosis not present

## 2018-10-20 DIAGNOSIS — J209 Acute bronchitis, unspecified: Secondary | ICD-10-CM | POA: Diagnosis not present

## 2018-10-20 DIAGNOSIS — F5105 Insomnia due to other mental disorder: Secondary | ICD-10-CM | POA: Diagnosis not present

## 2018-10-20 DIAGNOSIS — G8929 Other chronic pain: Secondary | ICD-10-CM | POA: Diagnosis not present

## 2018-10-20 DIAGNOSIS — G629 Polyneuropathy, unspecified: Secondary | ICD-10-CM | POA: Diagnosis not present

## 2018-10-20 DIAGNOSIS — I5032 Chronic diastolic (congestive) heart failure: Secondary | ICD-10-CM | POA: Diagnosis not present

## 2018-10-20 DIAGNOSIS — I5033 Acute on chronic diastolic (congestive) heart failure: Secondary | ICD-10-CM | POA: Diagnosis not present

## 2018-10-20 DIAGNOSIS — M159 Polyosteoarthritis, unspecified: Secondary | ICD-10-CM | POA: Diagnosis not present

## 2018-10-20 DIAGNOSIS — F329 Major depressive disorder, single episode, unspecified: Secondary | ICD-10-CM | POA: Diagnosis not present

## 2018-10-20 DIAGNOSIS — F419 Anxiety disorder, unspecified: Secondary | ICD-10-CM | POA: Diagnosis not present

## 2018-10-20 DIAGNOSIS — M2550 Pain in unspecified joint: Secondary | ICD-10-CM | POA: Diagnosis not present

## 2018-10-20 DIAGNOSIS — G609 Hereditary and idiopathic neuropathy, unspecified: Secondary | ICD-10-CM | POA: Diagnosis not present

## 2018-10-20 DIAGNOSIS — Z741 Need for assistance with personal care: Secondary | ICD-10-CM | POA: Diagnosis not present

## 2018-10-20 DIAGNOSIS — M797 Fibromyalgia: Secondary | ICD-10-CM | POA: Diagnosis not present

## 2018-10-20 DIAGNOSIS — I480 Paroxysmal atrial fibrillation: Secondary | ICD-10-CM | POA: Diagnosis not present

## 2018-10-20 DIAGNOSIS — E43 Unspecified severe protein-calorie malnutrition: Secondary | ICD-10-CM | POA: Diagnosis not present

## 2018-10-20 DIAGNOSIS — E039 Hypothyroidism, unspecified: Secondary | ICD-10-CM | POA: Diagnosis not present

## 2018-10-20 DIAGNOSIS — I25118 Atherosclerotic heart disease of native coronary artery with other forms of angina pectoris: Secondary | ICD-10-CM | POA: Diagnosis not present

## 2018-10-20 DIAGNOSIS — E440 Moderate protein-calorie malnutrition: Secondary | ICD-10-CM | POA: Diagnosis not present

## 2018-10-20 MED ORDER — IPRATROPIUM-ALBUTEROL 0.5-2.5 (3) MG/3ML IN SOLN: 3.0000 mL | Freq: Four times a day (QID) | RESPIRATORY_TRACT | Status: DC

## 2018-10-20 MED ORDER — PRO-STAT SUGAR FREE PO LIQD: 30.0000 mL | Freq: Two times a day (BID) | ORAL | 0 refills | Status: DC

## 2018-10-20 MED ORDER — ENSURE ENLIVE PO LIQD: 237.0000 mL | Freq: Two times a day (BID) | ORAL | 12 refills | Status: DC

## 2018-10-20 MED ORDER — GUAIFENESIN ER 600 MG PO TB12: 600.0000 mg | ORAL_TABLET | Freq: Two times a day (BID) | ORAL | Status: DC

## 2018-10-20 MED ORDER — CLONAZEPAM 0.5 MG PO TABS: 0.2500 mg | ORAL_TABLET | Freq: Two times a day (BID) | ORAL | 0 refills | Status: DC | PRN

## 2018-10-20 NOTE — Care Management (Signed)
CM has contacted APS multiple time over past few days requesting update on husbands case. No calls returned. CM has reached out to supervisor at [facility] and will provide updates.

## 2018-10-20 NOTE — Progress Notes (Signed)
Physical Therapy Treatment Patient Details Name: [name] MRN: [id number] DOB: Dec 25, 1926 Today's Date: 10/20/2018    History of Present Illness [name]  is a 85 y.o. female, with history of paroxysmal atrial fibrillation on anticoagulation with Eliquis, diastolic CHF, COPD, coronary disease, pernicious anemia, chronic respiratory failure on 2 L oxygen at home, came to hospital with worsening shortness of breath.  As per family symptoms have been getting worse for past 3 days.  Patient said that she is coughing up yellow phlegm which is very thick.    PT Comments    Pt received in bed with spouse at bedside and was agreeable to PT treatment. Pt toelrated bed level exercises well and required slightly less assistance for bed mobility this date. Pt able to stand with min A but had a few bouts of unsteadiness when she tended to have posterior lean. Able to perform a couple standing BLE strength activities and limited to sidestepping at EOB again due to fatigue. Continue to recommend venue below to further address limitations in strength, balance, and functional mobility to maximize return to PLOF.   Follow Up Recommendations  SNF     Equipment Recommendations  None recommended by PT    Recommendations for Other Services       Precautions / Restrictions Precautions Precautions: Fall Restrictions Weight Bearing Restrictions: No    Mobility  Bed Mobility Overal bed mobility: Needs Assistance Bed Mobility: Supine to Sit;Sit to Supine     Supine to sit: Min assist;Min guard Sit to supine: Min assist   General bed mobility comments: slow, labored movement, use of bed rail with elevated HOB, uprighting trunk assistance for supine to sit; assisted with BLE for sit > supine  Transfers Overall transfer level: Needs assistance Equipment used: Rolling walker (2 wheeled) Transfers: Sit to/from Stand Sit to Stand: Min assist         General transfer comment: verbal  cues for hand placement and sequencing  Ambulation/Gait Ambulation/Gait assistance: Mod assist Gait Distance (Feet): 2 Feet Assistive device: Rolling walker (2 wheeled) Gait Pattern/deviations: Decreased step length - right;Decreased step length - left;Decreased stride length;Shuffle;Trunk flexed Gait velocity: decreased   General Gait Details: more steady this date but continued labored sidesteps steps at bedside with with flexed trunk and shuffling step progression, on 2LPM and O2 sat 92-97% with activity   Stairs             Wheelchair Mobility    Modified Rankin (Stroke Patients Only)       Balance Overall balance assessment: Needs assistance Sitting-balance support: Feet supported;Bilateral upper extremity supported Sitting balance-Leahy Scale: Fair Sitting balance - Comments: seated EOB   Standing balance support: Bilateral upper extremity supported;During functional activity Standing balance-Leahy Scale: Poor Standing balance comment: using RW; tended to have posterior LOB and corrected with cues and assistance                            Cognition Arousal/Alertness: Awake/alert Behavior During Therapy: WFL for tasks assessed/performed Overall Cognitive Status: Within Functional Limits for tasks assessed                                        Exercises General Exercises - Lower Extremity Heel Slides: Strengthening;Both;10 reps;Supine Hip ABduction/ADduction: Strengthening;Both;10 reps;Supine;Standing;Limitations Hip Abduction/Adduction Limitations: x10 in bed and x10 in standing with RW Straight  Leg Raises: Strengthening;Both;10 reps;Supine Hip Flexion/Marching: Strengthening;Both;10 reps;Standing;Limitations Hip Flexion/Marching Limitations: with RW at EOB    General Comments        Pertinent Vitals/Pain Pain Assessment: No/denies pain    Home Living                      Prior Function            PT Goals  (current goals can now be found in the care plan section) Acute Rehab PT Goals Patient Stated Goal: return home, spouse in agreement that patient needs to go to rehab PT Goal Formulation: With patient/family Time For Goal Achievement: 10/28/18 Potential to Achieve Goals: Good    Frequency    Min 3X/week      PT Plan      Co-evaluation              AM-PAC PT "6 Clicks" Mobility   Outcome Measure  Help needed turning from your back to your side while in a flat bed without using bedrails?: A Lot Help needed moving from lying on your back to sitting on the side of a flat bed without using bedrails?: A Lot Help needed moving to and from a bed to a chair (including a wheelchair)?: A Lot Help needed standing up from a chair using your arms (e.g., wheelchair or bedside chair)?: A Lot Help needed to walk in hospital room?: A Lot Help needed climbing 3-5 steps with a railing? : Total 6 Click Score: 11    End of Session Equipment Utilized During Treatment: Gait belt;Oxygen(2 LPM) Activity Tolerance: Patient tolerated treatment well;Patient limited by fatigue;Patient limited by pain Patient left: in bed;with call bell/phone within reach;with family/visitor present;with bed alarm set(spouse at bedside) Nurse Communication: Mobility status PT Visit Diagnosis: Unsteadiness on feet (R26.81);Other abnormalities of gait and mobility (R26.89);Muscle weakness (generalized) (M62.81)     Time: 6812-7517 PT Time Calculation (min) (ACUTE ONLY): 19 min  Charges:  $Therapeutic Activity: 8-22 mins                         Geraldine Solar PT, DPT

## 2018-10-20 NOTE — Progress Notes (Signed)
Patient discharged to [facility].

## 2018-10-20 NOTE — Progress Notes (Signed)
Discharge instructions given to both pt and husbands. Report called to [place] at [facility]. Pt is waiting for neighbor to drop off clothes and pick up husband. Informed Izora Gala I would call her back once neighbor has arrived. [facility] will be transporting pt.

## 2018-10-20 NOTE — Discharge Summary (Signed)
Physician Discharge Summary  [name] HER:[id number] DOB: Mar 02, 1927 DOA: 10/11/2018  PCP: [name], MD  Admit date: 10/11/2018 Discharge date: 10/20/2018  Admitted From: home  Disposition:  SNF  Recommendations for Outpatient Follow-up:  1. Follow up with PCP in 1-2 weeks 2. Please obtain BMP/CBC in one week 3. Repeat chest xray in 2 weeks   Discharge Condition: stable CODE STATUS:DNR Diet recommendation: heart healthy  Brief/Interim Summary: 85 year old female with a history of COPD, chronic respiratory failure on 2 L oxygen, diastolic heart failure, atrial fibrillation, presented to hospital with worsening shortness of breath thought to be related to decompensated CHF and COPD exacerbation.  Discharge Diagnoses:  Active Problems:   COPD exacerbation (HCC)   Cellulitis, leg   Chronic diastolic CHF (congestive heart failure) (HCC)   AF (paroxysmal atrial fibrillation) (HCC)   Acute bronchitis   Acute URI   Bronchospasm   Malnutrition of moderate degree  Acute on chronic respiratory failure with hypoxia and hypercapnia (HCC) Acute exacerbation of COPD. Patient completed a course of antibiotics and steroids.  She is continued on bronchodilators.  Continue on home oxygen requirement.  She has chronic respiratory acidosis with a PCO2 in the 70s with compensated pH.  BiPAP therapy during the night was recommended, but patient has refused.  Active problems Cellulitis of the left lower leg with edema Improved.  Left lower extremity venous dopplers negative for DVT.    She has completed a course of antibiotics  Acute on chronic diastolic CHF.  ([place]) I suspect she mostly has right-sided heart failure.  Received IV Lasix and oral Lasix dose was increased.  2D echo shows normal EF of 60-65% with no wall motion abnormality.  Negative balance of 5.2L since admission. We will resume oral Lasix on discharge   Acute encephalopathy Patient was noted to be lethargic and ABG  showed hypercapnea. This seem to be a chronic process since pH was compensated. Bipap was ordered, but patient refused to wear it. Mental status appears to be at baseline at this time.  Paroxysmal A. fib Rate controlled with diltiazem.  Continue Eliquis.  Hypothyroidism Continue Synthroid.  Physical deconditioning and protein calorie malnutrition, moderate Continue nutritional supplement.  PT recommends SNF.  Discharge Instructions  Discharge Instructions    Diet - low sodium heart healthy   Complete by:  As directed    Increase activity slowly   Complete by:  As directed      Allergies as of 10/20/2018      Reactions   Shrimp [shellfish Allergy] Nausea And Vomiting, Swelling, Other (See Comments)   Made her run a fever also   Fish Allergy Nausea And Vomiting, Swelling, Other (See Comments)   Extremely sick   Adhesive [tape] Other (See Comments)   Tape BRUISES and TEARS THE SKIN; please use an alternative!!   Other Other (See Comments)   Does not remember which "mycin" drug = yeast infection No seeds or nuts = Digestive isues      Medication List    TAKE these medications   acetaminophen 500 MG tablet Commonly known as:  TYLENOL Take 2 tablets (1,000 mg total) by mouth every 8 (eight) hours. What changed:    when to take this  reasons to take this   albuterol 108 (90 Base) MCG/ACT inhaler Commonly known as:  PROVENTIL HFA;VENTOLIN HFA Inhale 2 puffs into the lungs every 6 (six) hours as needed. What changed:  reasons to take this   bismuth subsalicylate 856 DJ/49FW suspension Commonly  known as:  PEPTO BISMOL Take 30 mLs by mouth every 6 (six) hours as needed for indigestion.   clonazePAM 0.5 MG tablet Commonly known as:  KLONOPIN Take 0.5 tablets (0.25 mg total) by mouth 2 (two) times daily as needed. for anxiety What changed:  how much to take   clorazepate 7.5 MG tablet Commonly known as:  TRANXENE Take 7.5 mg by mouth at bedtime.   diltiazem 360 MG  24 hr capsule Commonly known as:  CARDIZEM CD Take 1 capsule (360 mg total) by mouth daily.   Eliquis 2.5 MG Tabs tablet Generic drug:  apixaban Take 2.5 mg by mouth 2 (two) times daily.   feeding supplement (ENSURE ENLIVE) Liqd Take 237 mLs by mouth 2 (two) times daily between meals.   feeding supplement (PRO-STAT SUGAR FREE 64) Liqd Take 30 mLs by mouth 2 (two) times daily.   folic acid 284 MCG tablet Commonly known as:  FOLVITE Take 400 mcg by mouth daily.   furosemide 20 MG tablet Commonly known as:  LASIX TAKE 2 TABLETS BY MOUTH ONCE DAILY What changed:  how much to take   gabapentin 100 MG capsule Commonly known as:  NEURONTIN Take 200 mg by mouth at bedtime.   guaiFENesin 600 MG 12 hr tablet Commonly known as:  MUCINEX Take 1 tablet (600 mg total) by mouth 2 (two) times daily.   hydrocortisone 25 MG suppository Commonly known as:  ANUSOL-HC Place 25 mg rectally daily as needed for hemorrhoids.   ICaps Areds 2 Caps Take 1-2 capsules by mouth daily.   ipratropium-albuterol 0.5-2.5 (3) MG/3ML Soln Commonly known as:  DUONEB Take 3 mLs by nebulization every 6 (six) hours.   levothyroxine 75 MCG tablet Commonly known as:  SYNTHROID, LEVOTHROID Take 75 mcg by mouth daily.   magnesium hydroxide 400 MG/5ML suspension Commonly known as:  MILK OF MAGNESIA Take 5-15 mLs by mouth daily as needed for mild constipation.   nitroGLYCERIN 0.4 MG SL tablet Commonly known as:  NITROSTAT Place 0.4 mg under the tongue every 5 (five) minutes as needed for chest pain.   ondansetron 4 MG tablet Commonly known as:  ZOFRAN TAKE 1 TABLET BY MOUTH EVERY 8 HOURS AS NEEDED FOR NAUSEA OR VOMITING What changed:  See the new instructions.   OXYGEN Inhale 2 L into the lungs at bedtime. As instructed: *May use daily as needed   Proctozone-HC 2.5 % rectal cream Generic drug:  hydrocortisone Place 1 application rectally 2 (two) times daily.   sodium chloride HYPERTONIC 3 %  nebulizer solution Take by nebulization 2 (two) times daily. What changed:    how much to take  when to take this  reasons to take this   Systane 0.4-0.3 % Soln Generic drug:  Polyethyl Glycol-Propyl Glycol Place 1 drop into both eyes at bedtime as needed (for dry eyes).   trimethoprim-polymyxin b ophthalmic solution Commonly known as:  POLYTRIM Place 1 drop into the left eye See admin instructions. Instill 1 drop into the left eye three times a day for the 1st day and 1 drop four times a day on the 2nd day AFTER INJECTION   vitamin B-12 1000 MCG tablet Commonly known as:  CYANOCOBALAMIN Take 1,000 mcg by mouth daily.      Contact information for after-discharge care    [facility] .   Service:  Skilled Nursing Contact information: [address] [place] [phone]  Allergies  Allergen Reactions  . Shrimp [Shellfish Allergy] Nausea And Vomiting, Swelling and Other (See Comments)    Made her run a fever also  . Fish Allergy Nausea And Vomiting, Swelling and Other (See Comments)    Extremely sick  . Adhesive [Tape] Other (See Comments)    Tape BRUISES and TEARS THE SKIN; please use an alternative!!  . Other Other (See Comments)    Does not remember which "mycin" drug = yeast infection No seeds or nuts = Digestive isues    Consultations:     Procedures/Studies: Dg Chest 2 View  Result Date: 10/11/2018 CLINICAL DATA:  Worsening shortness of breath for 3 days. Pain all over. History of COPD and CHF. EXAM: CHEST - 2 VIEW COMPARISON:  03/29/2018 FINDINGS: Cardiac enlargement. Bilateral perihilar airspace disease, likely representing edema. Increasing bilateral pleural effusions with basilar atelectasis or consolidation since previous study. Emphysematous changes in the lungs. No pneumothorax. Degenerative changes in the spine. IMPRESSION: Cardiac enlargement. Perihilar edema.  Increasing bilateral pleural effusions and basilar atelectasis or consolidation since previous study. Electronically Signed   By: Lucienne Capers M.D.   On: 10/11/2018 22:13   US Venous Img Lower Unilateral Left  Result Date: 10/13/2018 CLINICAL DATA:  Left lower extremity pain and edema. History of fall on 06/28/2018. Evaluate for DVT. EXAM: LEFT LOWER EXTREMITY VENOUS DOPPLER ULTRASOUND TECHNIQUE: Gray-scale sonography with graded compression, as well as color Doppler and duplex ultrasound were performed to evaluate the lower extremity deep venous systems from the level of the common femoral vein and including the common femoral, femoral, profunda femoral, popliteal and calf veins including the posterior tibial, peroneal and gastrocnemius veins when visible. The superficial great saphenous vein was also interrogated. Spectral Doppler was utilized to evaluate flow at rest and with distal augmentation maneuvers in the common femoral, femoral and popliteal veins. COMPARISON:  None. FINDINGS: Contralateral Common Femoral Vein: Respiratory phasicity is normal and symmetric with the symptomatic side. No evidence of thrombus. Normal compressibility. Common Femoral Vein: No evidence of thrombus. Normal compressibility, respiratory phasicity and response to augmentation. Saphenofemoral Junction: No evidence of thrombus. Normal compressibility and flow on color Doppler imaging. Profunda Femoral Vein: No evidence of thrombus. Normal compressibility and flow on color Doppler imaging. Femoral Vein: No evidence of thrombus. Normal compressibility, respiratory phasicity and response to augmentation. Popliteal Vein: No evidence of thrombus. Normal compressibility, respiratory phasicity and response to augmentation. Calf Veins: No evidence of thrombus. Normal compressibility and flow on color Doppler imaging. Superficial Great Saphenous Vein: No evidence of thrombus. Normal compressibility. Venous Reflux:  None. Other  Findings: Note is made of an approximately 1.4 x 2.0 x 0.6 cm serpiginous anechoic fluid collection with the left popliteal fossa compatible with a Baker's cyst. IMPRESSION: 1. No evidence of DVT within the left lower extremity. 2. Incidentally noted approximately 2.0 cm anechoic left-sided Baker's cyst. Electronically Signed   By: Sandi Mariscal M.D.   On: 10/13/2018 13:59   Dg Chest Port 1 View  Result Date: 10/15/2018 CLINICAL DATA:  Acute on chronic diastolic heart failure EXAM: PORTABLE CHEST 1 VIEW COMPARISON:  10/13/2018 FINDINGS: Increased interstitial markings, favoring mild interstitial edema, unchanged. Small to moderate bilateral pleural effusions, unchanged. No pneumothorax. Cardiomegaly. IMPRESSION: Cardiomegaly with mild interstitial edema and small to moderate bilateral pleural effusions, unchanged. Electronically Signed   By: Julian Hy M.D.   On: 10/15/2018 07:09   Dg Chest Port 1 View  Result Date: 10/13/2018 CLINICAL DATA:  Shortness of breath.  History of  COPD. EXAM: PORTABLE CHEST 1 VIEW COMPARISON:  10/11/2018; 03/29/2018; chest CT-03/29/2018 FINDINGS: Interval increase in now likely moderate to large sized bilateral effusions with associated bibasilar consolidative opacities. Bilateral effusions obscure the bilateral heart borders. The pulmonary vasculature appears indistinct with cephalization flow. Minimal biapical pleuroparenchymal thickening. No definite pneumothorax. No acute osseous abnormalities. IMPRESSION: Findings most suggestive of worsening pulmonary edema with increase in moderate to large-sized bilateral effusions and associated worsening bibasilar consolidative opacities, atelectasis versus infiltrate Electronically Signed   By: Sandi Mariscal M.D.   On: 10/13/2018 14:00       Subjective: No shortness of breath. No cough, no chest pain  Discharge Exam: Vitals:   10/20/18 0500 10/20/18 0520 10/20/18 0831 10/20/18 1202  BP:  (!) 145/73    Pulse:  97     Resp:  18    Temp:  97.9 F (36.6 C)    TempSrc:  Oral    SpO2:  99% 99% 99%  Weight: 55.4 kg     Height:        General: Pt is alert, awake, not in acute distress Cardiovascular: RRR, S1/S2 +, no rubs, no gallops Respiratory: CTA bilaterally, no wheezing, no rhonchi Abdominal: Soft, NT, ND, bowel sounds + Extremities: L>R LE edema, no cyanosis    The results of significant diagnostics from this hospitalization (including imaging, microbiology, ancillary and laboratory) are listed below for reference.     Microbiology: No results found for this or any previous visit (from the past 240 hour(s)).   Labs: BNP (last 3 results) Recent Labs    03/29/18 1612 10/11/18 2106  BNP 604.9* 841.3*   Basic Metabolic Panel: Recent Labs  Lab 10/14/18 0513 10/15/18 0544 10/16/18 0701 10/17/18 0619 10/18/18 0444 10/19/18 0533  NA 137 137 138 139 138 139  K 3.8 4.3 3.8 3.8 3.8 4.1  CL 95* 93* 89* 89* 88* 89*  CO2 34* 36* 41* 42* 42* 43*  GLUCOSE 123* 132* 137* 100* 116* 97  BUN 19 21 20  27* 28* 25*  CREATININE 0.82 0.74 0.69 0.67 0.58 0.57  CALCIUM 8.3* 8.3* 8.5* 8.6* 8.6* 8.3*  MG  --  1.9  --   --   --   --   PHOS 3.7 3.4  --   --   --   --    Liver Function Tests: Recent Labs  Lab 10/14/18 0513 10/15/18 0544  ALBUMIN 3.1* 3.2*   No results for input(s): LIPASE, AMYLASE in the last 168 hours. No results for input(s): AMMONIA in the last 168 hours. CBC: Recent Labs  Lab 10/15/18 0544 10/19/18 0533  WBC 9.1 7.3  HGB 11.4* 11.4*  HCT 37.9 38.4  MCV 103.0* 101.9*  PLT 295 257   Cardiac Enzymes: No results for input(s): CKTOTAL, CKMB, CKMBINDEX, TROPONINI in the last 168 hours. BNP: Invalid input(s): POCBNP CBG: Recent Labs  Lab 10/17/18 2122  GLUCAP 145*   D-Dimer No results for input(s): DDIMER in the last 72 hours. Hgb A1c No results for input(s): HGBA1C in the last 72 hours. Lipid Profile No results for input(s): CHOL, HDL, LDLCALC, TRIG, CHOLHDL,  LDLDIRECT in the last 72 hours. Thyroid function studies No results for input(s): TSH, T4TOTAL, T3FREE, THYROIDAB in the last 72 hours.  Invalid input(s): FREET3 Anemia work up No results for input(s): VITAMINB12, FOLATE, FERRITIN, TIBC, IRON, RETICCTPCT in the last 72 hours. Urinalysis    Component Value Date/Time   COLORURINE COLORLESS (A) 06/28/2018 0826   APPEARANCEUR CLEAR 06/28/2018 2440  LABSPEC 1.005 06/28/2018 0826   PHURINE 8.0 06/28/2018 0826   GLUCOSEU NEGATIVE 06/28/2018 0826   HGBUR NEGATIVE 06/28/2018 0826   BILIRUBINUR NEGATIVE 06/28/2018 0826   KETONESUR NEGATIVE 06/28/2018 0826   PROTEINUR NEGATIVE 06/28/2018 0826   UROBILINOGEN 0.2 09/21/2014 1325   NITRITE NEGATIVE 06/28/2018 0826   LEUKOCYTESUR NEGATIVE 06/28/2018 0826   Sepsis Labs Invalid input(s): PROCALCITONIN,  WBC,  LACTICIDVEN Microbiology No results found for this or any previous visit (from the past 240 hour(s)).   Time coordinating discharge: 25mins  SIGNED:   Kathie Dike, MD  [facility] 10/20/2018, 12:22 PM   If 7PM-7AM, please contact night-coverage [email or web address]

## 2018-10-20 NOTE — TOC Transition Note (Signed)
Transition of Care [facility]) - CM/SW Discharge Note   Patient Details  Name: [name] MRN: [id number] Date of Birth: [birth year]  [facility] CM/SW Contact:  Melinda Barge, RN Phone Number: 10/20/2018, 12:38 PM     Clinical Narrative:   DC today to [facility]. [facility] staff will transport pt. Neighbor will come get husband and husband plans to go home and stay alone. APS has offered to assist with placement at any time if he changes his mind. Husband aware he will not be able to visit pt at [facility].     Final next level of care: [place]     Patient Goals and CMS Choice Patient states their goals for this hospitalization and ongoing recovery are:: be safe CMS Medicare.gov Compare Post Acute Care list provided to:: Patient Choice offered to / list presented to : Patient, Spouse  Discharge Placement              Patient [facility] bed at: [facility] Patient to be transferred to facility by: [facility] staff Name of family member notified: Kerri Perches (husband) at bedside Patient and family notified of of transfer: 10/20/18  Discharge Plan and Services     Post Acute Care Choice: [place]                   Readmission Risk Interventions Readmission Risk Prevention Plan 10/20/2018 10/12/2018  Transportation Screening Complete Complete  PCP or Specialist Appt within 3-5 Days Complete -  Home Care Screening - Complete  Medication Review (RN CM) - Complete  HRI or Home Care Consult Complete -  Social Work Consult for [place] Planning/Counseling Complete -  Palliative Care Screening Not Applicable -  Medication Review Press photographer) Complete -  Some recent data might be hidden

## 2018-10-20 NOTE — Care Management Important Message (Signed)
Important Message  Patient Details  Name: [name] MRN: [id number] Date of Birth: [birth year]   Medicare Important Message Given:  Yes    Sherald Barge, RN 10/20/2018, 12:39 PM

## 2018-10-20 NOTE — Progress Notes (Signed)
Nutrition Follow-up  DOCUMENTATION CODES:  Underweight, Severe malnutrition in context of acute illness/injury  INTERVENTION:  Continue Ensure Enlive po BID, each supplement provides 350 kcal and 20 grams of protein  Patient endorses nausea-says she effectively controls this with Pepto bismol at home. Would consider ordering PRN here.   Will continue to monitor meal intake and plan of care. Will follow up as warranted.   NUTRITION DIAGNOSIS:  Severe Malnutrition related to social / environmental circumstances (RD suspicious that pt lacks sufficient support at home) as evidenced by severe fat and muscle loss.   GOAL:  Patient will meet greater than or equal to 90% of their needs  Felt to have been met   MONITOR:  PO intake, Supplement acceptance, Skin, Labs, I & O's  ASSESSMENT:  85 y/o female PMHx afib, CHF, COPD, CAD, Pernicious anemia, and COPD/ Chronic Resp Failure (2L baseline). Presented to hospital w/ worsening SOB and productive cough x3 days. CXR negative. Pt admitted for acute bronchitis/bronciectatsis. Was also inadvertently found to have cellulitis to several extremities.   Following up with patient as it has been ~1 week since last seen. Subjectively, she appears worse. She is slightly more confused (thinks it is evening) and not as talkative. Keeps eyes closed most of time.   Per meal records, pt has eaten 50% of almost all her meals since admitted. Patient herself believes she has been eating more than 50%. Supplement wise, she reports regularly drinking the Ensure as long as its chocolate. She does not care for other flavors. She has not been doing as well with the prostat, accepting it only 50% of the time.   Patient reports nausea, which she did not have initially on admission. She says when she gets nauseated at home, she preemptively takes Valders before meals to "coat my stomach". She says this is quite effective.   Her weights since admission have fluctuated  between 113-122. Her bed weight today is 119.3 lbs. On physical exam, her lower extremity edema has improved. Her muscle wasting is more pronounced. Based on findings, now meets criteria for severe malnutrition.   Nutritionally speaking, there is nothing further to add at this point. Will continue to monitor meal intakes and plan of care.   From initial assessment: Pt reported UBW as 129. Was 114 lbs at a recent OP cardiology appt.   Labs: Albumin:3.3-> 2.9, PC02:78.3, BUN/Creat- stable since admit, BG 120-145 Meds: PO abx, IVF, Miralax  Recent Labs  Lab 10/14/18 0513 10/15/18 0544  10/17/18 0619 10/18/18 0444 10/19/18 0533  NA 137 137   < > 139 138 139  K 3.8 4.3   < > 3.8 3.8 4.1  CL 95* 93*   < > 89* 88* 89*  CO2 34* 36*   < > 42* 42* 43*  BUN 19 21   < > 27* 28* 25*  CREATININE 0.82 0.74   < > 0.67 0.58 0.57  CALCIUM 8.3* 8.3*   < > 8.6* 8.6* 8.3*  MG  --  1.9  --   --   --   --   PHOS 3.7 3.4  --   --   --   --   GLUCOSE 123* 132*   < > 100* 116* 97   < > = values in this interval not displayed.   NUTRITION - FOCUSED PHYSICAL EXAM:   Most Recent Value  Orbital Region  Moderate depletion  Upper Arm Region  Severe depletion  Thoracic and Lumbar Region  Severe depletion  Buccal Region  Moderate depletion  Temple Region  Moderate depletion  Clavicle Bone Region  Severe depletion  Clavicle and Acromion Bone Region  Severe depletion  Scapular Bone Region  Unable to assess  Dorsal Hand  Moderate depletion  Patellar Region  Moderate depletion  Anterior Thigh Region  Moderate depletion  Posterior Calf Region  Moderate depletion  Edema (RD Assessment)  Mild  Hair  Reviewed  Eyes  Reviewed  Mouth  Reviewed  Skin  Reviewed  Nails  Reviewed     Diet Order:   Diet Order            Diet Heart Room service appropriate? Yes; Fluid consistency: Thin  Diet effective now             EDUCATION NEEDS:  Not appropriate for education at this time  Skin: Cellulitis to BLE,  skin tear to L wrist  Last BM:  3/16  Height:  Ht Readings from Last 1 Encounters:  10/12/18 '5\' 7"'  (1.702 m)   Weight: Wt Readings from Last 1 Encounters:  10/20/18 55.4 kg   Wt Readings from Last 10 Encounters:  10/20/18 55.4 kg  08/17/18 51.7 kg  07/01/18 51.1 kg  06/25/18 49.9 kg  06/15/18 50 kg  04/27/18 49 kg  04/19/18 50.8 kg  04/06/18 49.8 kg  03/16/18 45.4 kg  01/20/18 53.2 kg   Ideal Body Weight:  61.36 kg  BMI:  Body mass index using dry wt is 17.9 kg/m.  114 lbs (51.82 kg) dosing weight  Estimated Nutritional Needs:  Kcal:  1550-1750 kcals (30-34 (30-34 kcal/kg bw) Protein:  73-83g Pro (1.4-1.6 g/kg bw) Fluid:  1.3-1.6 L fluid (25-30 ml/kg bw)  Burtis Junes RD, LDN, CNSC Clinical Nutrition Available Tues-Sat via Pager: [phone] 10/20/2018 11:01 AM

## 2018-10-21 ENCOUNTER — Other Ambulatory Visit: Payer: Self-pay | Admitting: Adult Health

## 2018-10-21 ENCOUNTER — Non-Acute Institutional Stay (SKILLED_NURSING_FACILITY): Payer: Medicare Other | Admitting: Adult Health

## 2018-10-21 ENCOUNTER — Encounter: Payer: Self-pay | Admitting: Adult Health

## 2018-10-21 DIAGNOSIS — G8929 Other chronic pain: Secondary | ICD-10-CM | POA: Diagnosis not present

## 2018-10-21 DIAGNOSIS — J441 Chronic obstructive pulmonary disease with (acute) exacerbation: Secondary | ICD-10-CM

## 2018-10-21 DIAGNOSIS — F419 Anxiety disorder, unspecified: Secondary | ICD-10-CM | POA: Diagnosis not present

## 2018-10-21 DIAGNOSIS — J479 Bronchiectasis, uncomplicated: Secondary | ICD-10-CM | POA: Diagnosis not present

## 2018-10-21 DIAGNOSIS — M2550 Pain in unspecified joint: Secondary | ICD-10-CM

## 2018-10-21 DIAGNOSIS — E038 Other specified hypothyroidism: Secondary | ICD-10-CM

## 2018-10-21 DIAGNOSIS — E43 Unspecified severe protein-calorie malnutrition: Secondary | ICD-10-CM

## 2018-10-21 DIAGNOSIS — G609 Hereditary and idiopathic neuropathy, unspecified: Secondary | ICD-10-CM

## 2018-10-21 DIAGNOSIS — I5032 Chronic diastolic (congestive) heart failure: Secondary | ICD-10-CM

## 2018-10-21 DIAGNOSIS — I480 Paroxysmal atrial fibrillation: Secondary | ICD-10-CM

## 2018-10-21 DIAGNOSIS — I25118 Atherosclerotic heart disease of native coronary artery with other forms of angina pectoris: Secondary | ICD-10-CM

## 2018-10-21 DIAGNOSIS — F5105 Insomnia due to other mental disorder: Secondary | ICD-10-CM

## 2018-10-21 MED ORDER — CLONAZEPAM 0.5 MG PO TABS: 0.2500 mg | ORAL_TABLET | Freq: Every day | ORAL | 0 refills | Status: DC | PRN

## 2018-10-21 MED ORDER — CLORAZEPATE DIPOTASSIUM 3.75 MG PO TABS: 3.7500 mg | ORAL_TABLET | Freq: Every evening | ORAL | 0 refills | Status: AC | PRN

## 2018-10-21 NOTE — Progress Notes (Signed)
Location:   [facility] Room Number: 158 P Place of Service:  SNF (31)   CODE STATUS: Full Code  Allergies  Allergen Reactions  . Shrimp [Shellfish Allergy] Nausea And Vomiting, Swelling and Other (See Comments)    Made her run a fever also  . Fish Allergy Nausea And Vomiting, Swelling and Other (See Comments)    Extremely sick  . Adhesive [Tape] Other (See Comments)    Tape BRUISES and TEARS THE SKIN; please use an alternative!!  . Other Other (See Comments)    Does not remember which "mycin" drug = yeast infection No seeds or nuts = Digestive isues    Chief Complaint  Patient presents with  . Hospitalization Follow-up    Hospital Follow up    HPI:  She is a 85 year old woman who was hospitalized from 10-11-18 through 10-20-18. She was treated for acute on chronic respiratory failure; diastolic heart failure. She does long term use of 02. She was treated a abt and coarse of steroids. She is here for short term rehab with her goal to return back home. She has been losing weight over the past several months. She will more than likely require long term placement at either skilled or assisted living. There are no reports of anxiety; agitation; no uncontrolled pain; no shortness of breath present. She will continue to be followed for her chronic illnesses including: chf; afib; cad; copd.   Past Medical History:  Diagnosis Date  . Anxiety   . Arthritis   . CHF (congestive heart failure) ([place])   . Complication of anesthesia    " I have to have a spinal beacuse my lungs & heart are bad "  . COPD (chronic obstructive pulmonary disease) ([place])   . Coronary artery disease   . Depression   . Fibromyalgia   . Hiatal hernia   . Hyperlipidemia   . Irregular heartbeat   . On home oxygen therapy    "2L at night and prn" (03/29/2018)  . Osteoporosis   . Peripheral neuropathy 08/30/2017  . Rotator cuff tear    RIGHT  . UTI (urinary tract infection) 09/2014     Past Surgical History:  Procedure Laterality Date  . APPENDECTOMY    . CATARACT EXTRACTION Left   . DILATION AND CURETTAGE OF UTERUS     x2  . ELBOW SURGERY     Right  . HIP ARTHROPLASTY Right 09/22/2014   Procedure: ARTHROPLASTY BIPOLAR HIP;  Surgeon: [name], MD;  Location: [facility];  Service: Orthopedics;  Laterality: Right;  . TONSILLECTOMY    . TOTAL HIP REVISION Right 10/13/2014   Procedure: REVISION RIGHT TOTAL HIP POSTERIOR ;  Surgeon: Rod Can, MD;  Location: [facility];  Service: Orthopedics;  Laterality: Right;    Social History   Socioeconomic History  . Marital status: Married    Spouse name: widowed  . Number of children: 1  . Years of education: 43  . Highest education level: Not on file  Occupational History  . Occupation: retired.  prev worked [employer]  . Financial resource strain: Not on file  . Food insecurity:    Worry: Not on file    Inability: Not on file  . Transportation needs:    Medical: Not on file    Non-medical: Not on file  Tobacco Use  . Smoking status: Never Smoker  . Smokeless tobacco: Never Used  Substance and Sexual Activity  .  Alcohol use: No  . Drug use: No  . Sexual activity: Not on file  Lifestyle  . Physical activity:    Days per week: Not on file    Minutes per session: Not on file  . Stress: Not on file  Relationships  . Social connections:    Talks on phone: Not on file    Gets together: Not on file    Attends religious service: Not on file    Active member of club or organization: Not on file    Attends meetings of clubs or organizations: Not on file    Relationship status: Not on file  . Intimate partner violence:    Fear of current or ex partner: Not on file    Emotionally abused: Not on file    Physically abused: Not on file    Forced sexual activity: Not on file  Other Topics Concern  . Not on file  Social History Narrative   Lives with husand   Caffeine use: 1.5 cups coffee per day and  pepsi   Right handed    Family History  Problem Relation Age of Onset  . Pancreatic cancer Father   . Heart failure Mother   . Hypertension Mother   . Heart attack Brother   . Hypertension Brother   . Stroke Brother   . Heart attack Sister   . Diabetes Son   . Obesity Son   . Heart attack Son       VITAL SIGNS BP 127/64   Pulse (!) 50   Temp 97.9 F (36.6 C)   Resp (!) 22   Ht 5\' 7"  (1.702 m)   Wt 120 lb 11.2 oz (54.7 kg)   SpO2 95%   BMI 18.90 kg/m   Medication Sig  . acetaminophen (TYLENOL) 500 MG tablet Take 1,000 mg by mouth every 8 (eight) hours.  Marland Kitchen albuterol (PROVENTIL HFA;VENTOLIN HFA) 108 (90 Base) MCG/ACT inhaler Inhale 2 puffs into the lungs every 6 (six) hours as needed for wheezing or shortness of breath.  . Amino Acids-Protein Hydrolys (FEEDING SUPPLEMENT, PRO-STAT SUGAR FREE 64,) LIQD Take 30 mLs by mouth 2 (two) times daily.  Marland Kitchen bismuth subsalicylate (PEPTO BISMOL) 262 MG/15ML suspension Take 30 mLs by mouth every 6 (six) hours as needed for indigestion.  . clonazePAM (KLONOPIN) 0.5 MG tablet Take 0.5 tablets (0.25 mg total) by mouth 2 (two) times daily as needed. for anxiety  . clorazepate (TRANXENE) 7.5 MG tablet Take 7.5 mg by mouth at bedtime as needed (for tightness in the stomach).   . diltiazem (CARDIZEM CD) 360 MG 24 hr capsule Take 1 capsule (360 mg total) by mouth daily.  Marland Kitchen ELIQUIS 2.5 MG TABS tablet Take 2.5 mg by mouth 2 (two) times daily.  . feeding supplement, ENSURE ENLIVE, (ENSURE ENLIVE) LIQD Take 237 mLs by mouth 2 (two) times daily between meals.  . folic acid (FOLVITE) 024 MCG tablet Take 400 mcg by mouth daily.  . furosemide (LASIX) 20 MG tablet Take 40 mg by mouth daily.  Marland Kitchen gabapentin (NEURONTIN) 100 MG capsule Take 200 mg by mouth at bedtime.   Marland Kitchen guaiFENesin (MUCINEX) 600 MG 12 hr tablet Take 1 tablet (600 mg total) by mouth 2 (two) times daily.  . hydrocortisone (ANUSOL-HC) 25 MG suppository Place 25 mg rectally daily as needed for  hemorrhoids.   Marland Kitchen ipratropium-albuterol (DUONEB) 0.5-2.5 (3) MG/3ML SOLN Take 3 mLs by nebulization every 6 (six) hours.  Marland Kitchen levothyroxine (SYNTHROID, LEVOTHROID) 75 MCG tablet  Take 75 mcg by mouth daily.    . magnesium hydroxide (MILK OF MAGNESIA) 400 MG/5ML suspension Take 5 mLs by mouth daily as needed for mild constipation.   . Multiple Vitamins-Minerals (ICAPS AREDS 2) CAPS Take 1 tablet by mouth 2 (two) times daily.   . nitroGLYCERIN (NITROSTAT) 0.4 MG SL tablet Place 0.4 mg under the tongue every 5 (five) minutes as needed for chest pain.   . NON FORMULARY Diet type:  NAS  . ondansetron (ZOFRAN) 4 MG tablet Take 4 mg by mouth every 8 (eight) hours as needed for nausea or vomiting.  . OXYGEN Inhale 2 L into the lungs continuous.   Vladimir Faster Glycol-Propyl Glycol (SYSTANE) 0.4-0.3 % SOLN Place 1 drop into both eyes at bedtime as needed (for dry eyes).   Marland Kitchen PROCTOZONE-HC 2.5 % rectal cream Place 1 application rectally 2 (two) times daily.   . sodium chloride HYPERTONIC 3 % nebulizer solution Take 4 mLs by nebulization 2 (two) times daily.  Marland Kitchen trimethoprim-polymyxin b (POLYTRIM) ophthalmic solution Place 1 drop into the left eye See admin instructions. Instill 1 drop into the left eye three times a day for the 1st day and 1 drop four times a day on the 2nd day AFTER INJECTION  . vitamin B-12 (CYANOCOBALAMIN) 1000 MCG tablet Take 1,000 mcg by mouth daily.   No facility-administered encounter medications on file as of 10/21/2018.      SIGNIFICANT DIAGNOSTIC EXAMS   TODAY;   10-11-18: chest x-ray: Cardiac enlargement. Perihilar edema. Increasing bilateral pleural effusions and basilar atelectasis or consolidation since previous Study.  10-13-18: left lower extremity venous doppler: 1. No evidence of DVT within the left lower extremity. 2. Incidentally noted approximately 2.0 cm anechoic left-sided Baker's cyst  10-13-18: chest x-ray: Findings most suggestive of worsening pulmonary edema with  increase in moderate to large-sized bilateral effusions and associated worsening bibasilar consolidative opacities, atelectasis versus infiltrate  10-14-18: 2-d echo:   1. The left ventricle has normal systolic function with an ejection fraction of 60-65%. The cavity size was normal. There is mild concentric left ventricular hypertrophy. Left ventricular diastolic function could not be evaluated secondary to atrial  fibrillation. Elevated left ventricular end-diastolic pressure No evidence of left ventricular regional wall motion abnormalities. 2. Left atrial size was mildly dilated. 3. Right atrial size was mildly dilated. 4. Mild thickening of the mitral valve leaflet. There is moderate mitral annular calcification present. Mitral valve regurgitation is moderate by color flow Doppler. Mild calcification of papillary muscle and chordae tendineae. 5. Tricuspid valve regurgitation is moderate. 6. The aortic valve is tricuspid Aortic valve regurgitation is mild by color flow Doppler. 7. The aortic root is normal in size and structure. 8. The inferior vena cava was dilated in size with >50% respiratory variability. 9. Pulmonary hypertension is moderate.  10-15-18: chest x-ray: Cardiomegaly with mild interstitial edema and small to moderate bilateral pleural effusions, unchanged.  LABS REVIEWED  06-15-18: tsh 3.380 10-11-18: wbc 8.6; hgb 11.7; hct 36.5; mcv 97.3; plt 330; glucose 111; bun 27; creat 0.92; k+ 4.1; na++ 134; ca 8.5 liver normal albumin 33  BNP 390.0 10-15-18: wbc 9.1; hgb 11.4; hct 37.9; mcv 103.0; plt 295; glucose 132; bun 21; creat 0.74; k+ 4.3; na++ 137; ca 8.3; phos 3.4; albumin 3.2; mag 1.9 10-19-18: wbc 7.3; hgb 11.4; hgb 38.4; mcv 101.9; plt 257; glucose 97; bun 25; creat 0.57; k+ 4.1; na++ 139; ca 8.3   Review of Systems  Reason unable to perform ROS: lethergic  Physical Exam Constitutional:      General: She is not in acute distress.    Appearance: She is well-developed.  She is not diaphoretic.     Comments: Frail   Eyes:     Comments: Poor vision due to macular degeneration  Neck:     Musculoskeletal: Neck supple.     Thyroid: No thyromegaly.  Cardiovascular:     Rate and Rhythm: Normal rate. Rhythm irregular.     Pulses: Normal pulses.     Heart sounds: Normal heart sounds.  Pulmonary:     Effort: Pulmonary effort is normal. No respiratory distress.     Breath sounds: Normal breath sounds.  Abdominal:     General: Bowel sounds are normal. There is no distension.     Palpations: Abdomen is soft.     Tenderness: There is no abdominal tenderness.  Musculoskeletal:     Right lower leg: Edema present.     Left lower leg: Edema present.     Comments: Is able to move all extremities Bilateral trace lower extremity edema   Lymphadenopathy:     Cervical: No cervical adenopathy.  Skin:    General: Skin is warm and dry.     Comments: Bilateral lower extremities discolored   Neurological:     Mental Status: She is alert. She is disoriented.  Psychiatric:        Mood and Affect: Mood normal.        ASSESSMENT/ PLAN:  TODAY:   1. Chronic diastolic heart failure: is stable EF 60-65% (10-14-18): will continue lasix 40 mg daily   2. AF (paraxysmal atrial fibrillation) heart rate is stable will continue diltiazem cd 360 mg daily for rate control and eliquis 2.5 mg twice daily   3. Coronary artery disease of native artery of native heart with stable angina pectoris: is stable will continue eliquis 2.5 mg twice daily diltiazem cd 360 mg daily and has ntg prn   4. COPD/bronchiectasis without acute exacerbation: is stable is 02 dependent will continue mucinex 600 mg twice daily hypertonic  3% saline neb twice daily duoneb every 6 hours has albuterol 2 puffs every 6 hour as needed   She completed steroids and abt. He has declined use of BIPAP nightly   5. Other specified hypothyroidism: is stable tsh 3.380 will continue synthroid 75 mcg daily   6.  Idiopathic neuropathy unspecified: is stable will continue neurontin 200 mg nightly  7. Protein calorie malnutrition severe: is stable albumin 3.2 weight is 120 pounds will continue prostat twice daily and ensure twice daily   8. Chronic pain of multiple joints: is stable will continue tylenol 1 gm every 8 hours.   9. External hemorrhoids: is stable will continue proctozone 2.5 % twice daily   10.  Chronic anxiety/insomina secondary to anxiety: she is lethargic; will lower her tranxene to 3.75 mg nightly as needed for 14 days and will lower her klonopin to 0.25 mg daily as needed for 14 days and will monitor her status   11. Left lower extremity cellulitis: is stable has completed her abt.   On 10-25-18 will check cbc; cmp 11-04-18 will repeat chest x-ray:        MD is aware of resident's narcotic use and is in agreement with current plan of care. We will attempt to wean resident as apropriate   Ok Edwards NP [facility]  Contact [phone] Monday through Friday 8am- 5pm  After hours call [phone]

## 2018-10-24 ENCOUNTER — Other Ambulatory Visit: Payer: Self-pay | Admitting: Adult Health

## 2018-10-24 ENCOUNTER — Non-Acute Institutional Stay (SKILLED_NURSING_FACILITY): Payer: Medicare Other | Admitting: Internal Medicine

## 2018-10-24 ENCOUNTER — Encounter: Payer: Self-pay | Admitting: Internal Medicine

## 2018-10-24 DIAGNOSIS — I4891 Unspecified atrial fibrillation: Secondary | ICD-10-CM | POA: Diagnosis not present

## 2018-10-24 DIAGNOSIS — G8929 Other chronic pain: Secondary | ICD-10-CM | POA: Insufficient documentation

## 2018-10-24 DIAGNOSIS — R112 Nausea with vomiting, unspecified: Secondary | ICD-10-CM

## 2018-10-24 DIAGNOSIS — J441 Chronic obstructive pulmonary disease with (acute) exacerbation: Secondary | ICD-10-CM | POA: Diagnosis not present

## 2018-10-24 DIAGNOSIS — E038 Other specified hypothyroidism: Secondary | ICD-10-CM

## 2018-10-24 DIAGNOSIS — I5032 Chronic diastolic (congestive) heart failure: Secondary | ICD-10-CM | POA: Diagnosis not present

## 2018-10-24 DIAGNOSIS — L03119 Cellulitis of unspecified part of limb: Secondary | ICD-10-CM

## 2018-10-24 DIAGNOSIS — F5105 Insomnia due to other mental disorder: Secondary | ICD-10-CM

## 2018-10-24 DIAGNOSIS — G609 Hereditary and idiopathic neuropathy, unspecified: Secondary | ICD-10-CM

## 2018-10-24 DIAGNOSIS — F419 Anxiety disorder, unspecified: Secondary | ICD-10-CM | POA: Insufficient documentation

## 2018-10-24 DIAGNOSIS — R634 Abnormal weight loss: Secondary | ICD-10-CM | POA: Diagnosis not present

## 2018-10-24 DIAGNOSIS — I25118 Atherosclerotic heart disease of native coronary artery with other forms of angina pectoris: Secondary | ICD-10-CM | POA: Insufficient documentation

## 2018-10-24 DIAGNOSIS — M2550 Pain in unspecified joint: Secondary | ICD-10-CM

## 2018-10-24 NOTE — Progress Notes (Signed)
Provider:  Veleta Miners, MD Location:  [facility] Room Number: 158 P Place of Service:  SNF (31)  PCP: [name], MD Patient Care Team: [name], MD as PCP - General (Endocrinology) Jacolyn Reedy, MD as PCP - Cardiology (Cardiology)  Extended Emergency Contact Information Primary Emergency Contact: Donavan,Donald Address: [address] [place], [place] [address] [place] Phone: [phone] Mobile Phone: [phone] Relation: Spouse Secondary Emergency Contact: Archer,Charles  [place] Phone: [phone] Relation: Friend  Code Status: Full Code Goals of Care: Advanced Directive information Advanced Directives 10/24/2018  Does Patient Have a Medical Advance Directive? No  Type of Advance Directive -  Does patient want to make changes to medical advance directive? -  Copy of [place] in Chart? -  Would patient like information on creating a medical advance directive? No - Patient declined      Chief Complaint  Patient presents with  . New Admit To SNF    Admission    HPI: Patient is a 85 y.o. female seen today for admission to SNF after staying in the Hospital from 03/10-03/19 COPD exacerbation. Patient has h/o Diastolic CHF, CAD, COPD, Atrial Fibrillation, Peripheral Neuropathy  She came to ED for worsening SOB. She was treated as COPD exacerbation with Antibiotics and Steroids.  Patient also had cellulitis of low lower leg with edema.  Her Dopplers were negative for DVT she completed the course of antibiotics She was also treated for diastolic CHF.  With IV diuretics.  She was described discharged on her home oral Lasix dose Due to her hypercapnia it was recommended that patient use BiPAP but she has refused before. This morning patient was complaining of nausea and had one episode of vomiting.  She said that she has not had any bowel movements for past few  days.  The nurses checked her and found her to be impacted. Patient says her breathing is better.  She is short of breath on exertion at her baseline and is on 2 L of oxygen at home which he uses as needed.  She lives with her husband and uses cane and sometimes a walker.    Past Medical History:  Diagnosis Date  . Anxiety   . Arthritis   . CHF (congestive heart failure) ([place])   . Complication of anesthesia    " I have to have a spinal beacuse my lungs & heart are bad "  . COPD (chronic obstructive pulmonary disease) ([place])   . Coronary artery disease   . Depression   . Fibromyalgia   . Hiatal hernia   . Hyperlipidemia   . Irregular heartbeat   . On home oxygen therapy    "2L at night and prn" (03/29/2018)  . Osteoporosis   . Peripheral neuropathy 08/30/2017  . Rotator cuff tear    RIGHT  . UTI (urinary tract infection) 09/2014   Past Surgical History:  Procedure Laterality Date  . APPENDECTOMY    . CATARACT EXTRACTION Left   . DILATION AND CURETTAGE OF UTERUS     x2  . ELBOW SURGERY     Right  . HIP ARTHROPLASTY Right 09/22/2014   Procedure: ARTHROPLASTY BIPOLAR HIP;  Surgeon: [name], MD;  Location: [facility];  Service: Orthopedics;  Laterality: Right;  . TONSILLECTOMY    . TOTAL HIP REVISION Right 10/13/2014   Procedure: REVISION RIGHT TOTAL HIP POSTERIOR ;  Surgeon: Rod Can, MD;  Location: [facility];  Service: Orthopedics;  Laterality: Right;    reports that she has never smoked. She has never used smokeless tobacco. She reports that she does not drink alcohol or use drugs. Social History   Socioeconomic History  . Marital status: Married    Spouse name: widowed  . Number of children: 1  . Years of education: 67  . Highest education level: Not on file  Occupational History  . Occupation: retired.  prev worked [employer]  . Financial resource strain: Not on file  . Food insecurity:    Worry: Not on file    Inability: Not on file  .  Transportation needs:    Medical: Not on file    Non-medical: Not on file  Tobacco Use  . Smoking status: Never Smoker  . Smokeless tobacco: Never Used  Substance and Sexual Activity  . Alcohol use: No  . Drug use: No  . Sexual activity: Not on file  Lifestyle  . Physical activity:    Days per week: Not on file    Minutes per session: Not on file  . Stress: Not on file  Relationships  . Social connections:    Talks on phone: Not on file    Gets together: Not on file    Attends religious service: Not on file    Active member of club or organization: Not on file    Attends meetings of clubs or organizations: Not on file    Relationship status: Not on file  . Intimate partner violence:    Fear of current or ex partner: Not on file    Emotionally abused: Not on file    Physically abused: Not on file    Forced sexual activity: Not on file  Other Topics Concern  . Not on file  Social History Narrative   Lives with husand   Caffeine use: 1.5 cups coffee per day and pepsi   Right handed     Functional Status Survey:    Family History  Problem Relation Age of Onset  . Pancreatic cancer Father   . Heart failure Mother   . Hypertension Mother   . Heart attack Brother   . Hypertension Brother   . Stroke Brother   . Heart attack Sister   . Diabetes Son   . Obesity Son   . Heart attack Son     Health Maintenance  Topic Date Due  . INFLUENZA VACCINE  11/01/2018 (Originally 03/03/2018)  . DEXA SCAN  11/21/2018 (Originally 03/26/1994)  . PNA vac Low Risk Adult (2 of 2 - PCV13) 11/21/2018 (Originally 12/04/2015)  . TETANUS/TDAP  06/25/2028    Allergies  Allergen Reactions  . Shrimp [Shellfish Allergy] Nausea And Vomiting, Swelling and Other (See Comments)    Made her run a fever also  . Fish Allergy Nausea And Vomiting, Swelling and Other (See Comments)    Extremely sick  . Adhesive [Tape] Other (See Comments)    Tape BRUISES and TEARS THE SKIN; please use an  alternative!!  . Other Other (See Comments)    Does not remember which "mycin" drug = yeast infection No seeds or nuts = Digestive isues    Outpatient Encounter Medications as of 10/24/2018  Medication Sig  . acetaminophen (TYLENOL) 500 MG tablet Take 1,000 mg by mouth every 8 (eight) hours.  Marland Kitchen albuterol (PROVENTIL HFA;VENTOLIN HFA) 108 (90 Base) MCG/ACT inhaler Inhale 2 puffs into the lungs every 6 (  six) hours as needed for wheezing or shortness of breath.  . Amino Acids-Protein Hydrolys (FEEDING SUPPLEMENT, PRO-STAT SUGAR FREE 64,) LIQD Take 30 mLs by mouth 2 (two) times daily.  Roseanne Kaufman Peru-Castor Oil (VENELEX) OINT Apply topically to sacrum and bilateral buttocks every shift and as needed for prevention  . clonazePAM (KLONOPIN) 0.5 MG tablet Take 0.5 tablets (0.25 mg total) by mouth daily as needed for up to 14 days. for anxiety  . clorazepate (TRANXENE-T) 3.75 MG tablet Take 1 tablet (3.75 mg total) by mouth at bedtime as needed for up to 14 days for anxiety.  Marland Kitchen diltiazem (CARDIZEM CD) 360 MG 24 hr capsule Take 1 capsule (360 mg total) by mouth daily.  Marland Kitchen ELIQUIS 2.5 MG TABS tablet Take 2.5 mg by mouth 2 (two) times daily.  . feeding supplement, ENSURE ENLIVE, (ENSURE ENLIVE) LIQD Take 237 mLs by mouth 2 (two) times daily between meals.  . folic acid (FOLVITE) 160 MCG tablet Take 400 mcg by mouth daily.  . furosemide (LASIX) 20 MG tablet Take 40 mg by mouth daily.  Marland Kitchen gabapentin (NEURONTIN) 100 MG capsule Take 200 mg by mouth at bedtime.   Marland Kitchen guaiFENesin (MUCINEX) 600 MG 12 hr tablet Take 1 tablet (600 mg total) by mouth 2 (two) times daily.  . hydrocortisone (ANUSOL-HC) 25 MG suppository Place 25 mg rectally daily as needed for hemorrhoids.   Marland Kitchen ipratropium-albuterol (DUONEB) 0.5-2.5 (3) MG/3ML SOLN Take 3 mLs by nebulization every 6 (six) hours.  Marland Kitchen levothyroxine (SYNTHROID, LEVOTHROID) 75 MCG tablet Take 75 mcg by mouth daily.    . magnesium hydroxide (MILK OF MAGNESIA) 400 MG/5ML  suspension Take 5 mLs by mouth daily as needed for mild constipation.   . Multiple Vitamins-Minerals (ICAPS AREDS 2) CAPS Take 1 tablet by mouth 2 (two) times daily.   . nitroGLYCERIN (NITROSTAT) 0.4 MG SL tablet Place 0.4 mg under the tongue every 5 (five) minutes as needed for chest pain.   . NON FORMULARY Diet type:  NAS  . ondansetron (ZOFRAN) 4 MG tablet Take 4 mg by mouth every 8 (eight) hours as needed for nausea or vomiting.  . OXYGEN Inhale 2 L into the lungs continuous.   Vladimir Faster Glycol-Propyl Glycol (SYSTANE) 0.4-0.3 % SOLN Place 1 drop into both eyes at bedtime as needed (for dry eyes).   Marland Kitchen PROCTOZONE-HC 2.5 % rectal cream Place 1 application rectally 2 (two) times daily.   . sodium chloride HYPERTONIC 3 % nebulizer solution Take 4 mLs by nebulization 2 (two) times daily.  Marland Kitchen trimethoprim-polymyxin b (POLYTRIM) ophthalmic solution Place 1 drop into the left eye See admin instructions. Instill 1 drop into the left eye three times a day for the 1st day and 1 drop four times a day on the 2nd day AFTER INJECTION  . vitamin B-12 (CYANOCOBALAMIN) 1000 MCG tablet Take 1,000 mcg by mouth daily.  . Vitamins A & D (VITAMIN A & D) ointment Apply topically to bilateral feet and heels every shift and as needed for prevention  . [DISCONTINUED] bismuth subsalicylate (PEPTO BISMOL) 262 MG/15ML suspension Take 30 mLs by mouth every 6 (six) hours as needed for indigestion.   No facility-administered encounter medications on file as of 10/24/2018.     Review of Systems  Constitutional: Positive for activity change and appetite change.  HENT: Negative.   Respiratory: Positive for cough and shortness of breath.   Cardiovascular: Positive for leg swelling.  Gastrointestinal: Positive for abdominal distention, abdominal pain, constipation, nausea and vomiting.  Genitourinary: Negative.   Musculoskeletal: Positive for back pain.  Skin: Negative.   Neurological: Positive for weakness.   Psychiatric/Behavioral: Positive for dysphoric mood. The patient is nervous/anxious.     Vitals:   10/24/18 0843  BP: (!) 145/71  Pulse: 88  Resp: (!) 22  Temp: (!) 97 F (36.1 C)  SpO2: 96%  Weight: 119 lb 3.2 oz (54.1 kg)  Height: 5\' 7"  (1.702 m)   Body mass index is 18.67 kg/m. Physical Exam Vitals signs reviewed.  Constitutional:      Appearance: Normal appearance.  HENT:     Head: Normocephalic.     Nose: Nose normal.     Mouth/Throat:     Mouth: Mucous membranes are moist.     Pharynx: Oropharynx is clear.  Eyes:     Pupils: Pupils are equal, round, and reactive to light.  Neck:     Musculoskeletal: Neck supple.  Cardiovascular:     Rate and Rhythm: Normal rate. Rhythm irregular.     Pulses: Normal pulses.     Heart sounds: Normal heart sounds.  Pulmonary:     Effort: Pulmonary effort is normal.     Comments: Bilateral Decreased BS but no Wheezing or Rales Abdominal:     General: Abdomen is flat. Bowel sounds are normal. There is no distension.     Palpations: Abdomen is soft.     Tenderness: There is no abdominal tenderness.  Musculoskeletal:     Comments: Chronic Venous Changes with No Cellulitis and Chronic Edema  Skin:    General: Skin is warm and dry.  Neurological:     General: No focal deficit present.     Mental Status: She is alert and oriented to person, place, and time.  Psychiatric:        Mood and Affect: Mood normal.        Thought Content: Thought content normal.     Labs reviewed: Basic Metabolic Panel: Recent Labs    03/31/18 0254  04/06/18 0401  10/14/18 0513 10/15/18 0544  10/17/18 0619 10/18/18 0444 10/19/18 0533  NA 139   < > 137   < > 137 137   < > 139 138 139  K 3.2*   < > 3.8   < > 3.8 4.3   < > 3.8 3.8 4.1  CL 94*   < > 93*   < > 95* 93*   < > 89* 88* 89*  CO2 34*   < > 36*   < > 34* 36*   < > 42* 42* 43*  GLUCOSE 101*   < > 109*   < > 123* 132*   < > 100* 116* 97  BUN 17   < > 17   < > 19 21   < > 27* 28* 25*   CREATININE 0.88   < > 0.85   < > 0.82 0.74   < > 0.67 0.58 0.57  CALCIUM 8.6*   < > 8.5*   < > 8.3* 8.3*   < > 8.6* 8.6* 8.3*  MG 2.0  --  2.4  --   --  1.9  --   --   --   --   PHOS 4.5  --  3.0  --  3.7 3.4  --   --   --   --    < > = values in this interval not displayed.   Liver Function Tests: Recent Labs    08/17/18 1640 10/11/18 2106  10/12/18 0426 10/14/18 0513 10/15/18 0544  AST 26 39 29  --   --   ALT 12 28 24   --   --   ALKPHOS 117 97 84  --   --   BILITOT 0.3 0.6 0.5  --   --   PROT 7.5 7.6 6.7  --   --   ALBUMIN 4.1 3.3* 2.9* 3.1* 3.2*   No results for input(s): LIPASE, AMYLASE in the last 8760 hours. Recent Labs    04/04/18 0359  AMMONIA 50*   CBC: Recent Labs    03/31/18 0254  06/28/18 0905  10/11/18 2106 10/12/18 0426 10/15/18 0544 10/19/18 0533  WBC 7.1   < > 7.0   < > 8.6 7.1 9.1 7.3  NEUTROABS 4.6  --  5.3  --  6.3  --   --   --   HGB 12.5   < > 11.6*   < > 11.7* 11.3* 11.4* 11.4*  HCT 39.6   < > 38.2   < > 36.5 36.1 37.9 38.4  MCV 99.2   < > 101.1*   < > 97.3 98.1 103.0* 101.9*  PLT 183   < > 179   < > 330 257 295 257   < > = values in this interval not displayed.   Cardiac Enzymes: Recent Labs    03/30/18 0155 03/30/18 0804 10/11/18 2106  TROPONINI <0.03 <0.03 <0.03   BNP: Invalid input(s): POCBNP No results found for: HGBA1C Lab Results  Component Value Date   TSH 3.380 06/15/2018   Lab Results  Component Value Date   VITAMINB12 1,500 (H) 08/30/2017   No results found for: FOLATE No results found for: IRON, TIBC, FERRITIN  Imaging and Procedures obtained prior to SNF admission: No results found.  Assessment/Plan  COPD exacerbation  On Her Baseline Oxygen.  She says she is always SOB Refuses to use BIPAP for her Hypercapnia   Chronic diastolic congestive heart failure  Discharged on Lasix home dose Repeat BMP Cellulitis of LE Dopplers were negative in hospital Was treated with Antibiotics in hospital  Atrial  fibrillation with RVR  On Diltiazem Also On Eliquis  Nausea and vomiting Will continue to monitor Feels better right now with disimpactoin   hypothyroidism TSH level from 11/19 was normal Continue Same dose of Synthyrpoid  Idiopathic peripheral neuropathy Continue on Neurontin  Chronic anxiety On PRN Klonopin       Family/ staff Communication:   Labs/tests ordered: Total time spent in this patient care encounter was 45_ minutes; greater than 50% of the visit spent counseling patient, reviewing records , Labs and coordinating care for problems addressed at this encounter.

## 2018-10-25 ENCOUNTER — Encounter (HOSPITAL_COMMUNITY)
Admission: RE | Admit: 2018-10-25 | Discharge: 2018-10-25 | Disposition: A | Discharge status: Home / Self Care | Payer: Medicare Other | Source: Skilled Nursing Facility | Attending: Adult Health | Admitting: Adult Health

## 2018-10-25 DIAGNOSIS — K5900 Constipation, unspecified: Secondary | ICD-10-CM | POA: Insufficient documentation

## 2018-10-25 DIAGNOSIS — L03116 Cellulitis of left lower limb: Secondary | ICD-10-CM | POA: Insufficient documentation

## 2018-10-25 DIAGNOSIS — J9622 Acute and chronic respiratory failure with hypercapnia: Secondary | ICD-10-CM | POA: Insufficient documentation

## 2018-10-25 DIAGNOSIS — J9621 Acute and chronic respiratory failure with hypoxia: Secondary | ICD-10-CM | POA: Insufficient documentation

## 2018-10-25 DIAGNOSIS — J40 Bronchitis, not specified as acute or chronic: Secondary | ICD-10-CM | POA: Insufficient documentation

## 2018-10-25 LAB — COMPREHENSIVE METABOLIC PANEL
ALT: 23 U/L (ref 0–44)
AST: 27 U/L (ref 15–41)
Albumin: 3.3 g/dL — ABNORMAL LOW (ref 3.5–5.0)
Alkaline Phosphatase: 83 U/L (ref 38–126)
Anion gap: 10 (ref 5–15)
BILIRUBIN TOTAL: 0.8 mg/dL (ref 0.3–1.2)
BUN: 19 mg/dL (ref 8–23)
CO2: 35 mmol/L — ABNORMAL HIGH (ref 22–32)
Calcium: 8.4 mg/dL — ABNORMAL LOW (ref 8.9–10.3)
Chloride: 94 mmol/L — ABNORMAL LOW (ref 98–111)
Creatinine, Ser: 0.66 mg/dL (ref 0.44–1.00)
GFR calc Af Amer: >60 mL/min (ref >60)
GFR calc non Af Amer: >60 mL/min (ref >60)
Glucose, Bld: 98 mg/dL (ref 70–99)
POTASSIUM: 4 mmol/L (ref 3.5–5.1)
Sodium: 139 mmol/L (ref 135–145)
Total Protein: 7.1 g/dL (ref 6.5–8.1)

## 2018-10-25 LAB — CBC WITH DIFFERENTIAL/PLATELET
Abs Immature Granulocytes: 0.03 10*3/uL (ref 0.00–0.07)
Basophils Absolute: 0 10*3/uL (ref 0.0–0.1)
Basophils Relative: 0 %
Eosinophils Absolute: 0.3 10*3/uL (ref 0.0–0.5)
Eosinophils Relative: 4 %
HCT: 41.9 % (ref 36.0–46.0)
Hemoglobin: 12.5 g/dL (ref 12.0–15.0)
IMMATURE GRANULOCYTES: 0 %
LYMPHS PCT: 13 %
Lymphs Abs: 1 10*3/uL (ref 0.7–4.0)
MCH: 30 pg (ref 26.0–34.0)
MCHC: 29.8 g/dL — ABNORMAL LOW (ref 30.0–36.0)
MCV: 100.7 fL — ABNORMAL HIGH (ref 80.0–100.0)
Monocytes Absolute: 0.8 10*3/uL (ref 0.1–1.0)
Monocytes Relative: 10 %
Neutro Abs: 5.6 10*3/uL (ref 1.7–7.7)
Neutrophils Relative %: 73 %
Platelets: 171 10*3/uL (ref 150–400)
RBC: 4.16 MIL/uL (ref 3.87–5.11)
RDW: 14.4 % (ref 11.5–15.5)
WBC: 7.7 10*3/uL (ref 4.0–10.5)
nRBC: 0 % (ref 0.0–0.2)

## 2018-11-01 ENCOUNTER — Encounter: Payer: Self-pay | Admitting: Internal Medicine

## 2018-11-01 ENCOUNTER — Non-Acute Institutional Stay (SKILLED_NURSING_FACILITY): Payer: Medicare Other | Admitting: Internal Medicine

## 2018-11-01 DIAGNOSIS — E038 Other specified hypothyroidism: Secondary | ICD-10-CM | POA: Diagnosis not present

## 2018-11-01 DIAGNOSIS — I4891 Unspecified atrial fibrillation: Secondary | ICD-10-CM

## 2018-11-01 DIAGNOSIS — R112 Nausea with vomiting, unspecified: Secondary | ICD-10-CM | POA: Diagnosis not present

## 2018-11-01 DIAGNOSIS — J441 Chronic obstructive pulmonary disease with (acute) exacerbation: Secondary | ICD-10-CM | POA: Diagnosis not present

## 2018-11-01 NOTE — Progress Notes (Signed)
Location:    [facility] Room Number: 158/P Place of Service:  SNF [phone])  Provider: Veleta Miners MD  PCP: [name], MD Patient Care Team: [name], MD as PCP - General (Endocrinology) Jacolyn Reedy, MD as PCP - Cardiology (Cardiology)  Extended Emergency Contact Information Primary Emergency Contact: Calkin,Donald Address: [place], [place] [address] [place] Phone: [phone] Mobile Phone: [phone] Relation: Spouse Secondary Emergency Contact: Archer,Charles  [place] Phone: [phone] Relation: Friend  Code Status: Full Code Goals of care:  Advanced Directive information Advanced Directives 11/01/2018  Does Patient Have a Medical Advance Directive? Yes  Type of Advance Directive (No Data)  Does patient want to make changes to medical advance directive? No - Patient declined  Copy of [place] in Chart? -  Would patient like information on creating a medical advance directive? No - Patient declined     Allergies  Allergen Reactions  . Shrimp [Shellfish Allergy] Nausea And Vomiting, Swelling and Other (See Comments)    Made her run a fever also  . Fish Allergy Nausea And Vomiting, Swelling and Other (See Comments)    Extremely sick  . Adhesive [Tape] Other (See Comments)    Tape BRUISES and TEARS THE SKIN; please use an alternative!!  . Other Other (See Comments)    Does not remember which "mycin" drug = yeast infection No seeds or nuts = Digestive isues    Chief Complaint  Patient presents with  . Discharge Note    [facility] Visit    HPI:  85 y.o. female seen today for discharge from the facility. Patient was admitted to SNF after staying in the hospital from 03/10-03/19 COPD exacerbation. Patient has h/o Diastolic CHF, CAD, COPD, Atrial Fibrillation, Peripheral Neuropathy She came to ED for worsening shortness of breath.  She was  admitted and was treated for COPD exacerbation with antibiotics and steroids Patient also had some redness of her lower leg with edema.  Her Dopplers were negative for DVT was treated with a course of antibiotics. Patient was also diuresed for her diastolic CHF.  She was discharged to SNF for on her home dose of Lasix Patient has done well with therapy.  Initially she did have some nausea and fecal impaction but got better after that got resolved. Says her breathing is better.  She is SoB on exertion at baseline. She was using 2 L of oxygen as needed but now is going to be on it . She is using walker with  mild assist. Lives with her husband  Past Medical History:  Diagnosis Date  . Anxiety   . Arthritis   . CHF (congestive heart failure) ([place])   . Complication of anesthesia    " I have to have a spinal beacuse my lungs & heart are bad "  . COPD (chronic obstructive pulmonary disease) ([place])   . Coronary artery disease   . Depression   . Fibromyalgia   . Hiatal hernia   . Hyperlipidemia   . Irregular heartbeat   . On home oxygen therapy    "2L at night and prn" (03/29/2018)  . Osteoporosis   . Peripheral neuropathy 08/30/2017  . Rotator cuff tear    RIGHT  . UTI (urinary tract infection) 09/2014    Past Surgical History:  Procedure Laterality Date  . APPENDECTOMY    . CATARACT EXTRACTION Left   .  DILATION AND CURETTAGE OF UTERUS     x2  . ELBOW SURGERY     Right  . HIP ARTHROPLASTY Right 09/22/2014   Procedure: ARTHROPLASTY BIPOLAR HIP;  Surgeon: [name], MD;  Location: [facility];  Service: Orthopedics;  Laterality: Right;  . TONSILLECTOMY    . TOTAL HIP REVISION Right 10/13/2014   Procedure: REVISION RIGHT TOTAL HIP POSTERIOR ;  Surgeon: Rod Can, MD;  Location: [facility];  Service: Orthopedics;  Laterality: Right;      reports that she has never smoked. She has never used smokeless tobacco. She reports that she does not drink alcohol or use drugs. Social History    Socioeconomic History  . Marital status: Married    Spouse name: widowed  . Number of children: 1  . Years of education: 12  . Highest education level: Not on file  Occupational History  . Occupation: retired.  prev worked [employer]  . Financial resource strain: Not on file  . Food insecurity:    Worry: Not on file    Inability: Not on file  . Transportation needs:    Medical: Not on file    Non-medical: Not on file  Tobacco Use  . Smoking status: Never Smoker  . Smokeless tobacco: Never Used  Substance and Sexual Activity  . Alcohol use: No  . Drug use: No  . Sexual activity: Not on file  Lifestyle  . Physical activity:    Days per week: Not on file    Minutes per session: Not on file  . Stress: Not on file  Relationships  . Social connections:    Talks on phone: Not on file    Gets together: Not on file    Attends religious service: Not on file    Active member of club or organization: Not on file    Attends meetings of clubs or organizations: Not on file    Relationship status: Not on file  . Intimate partner violence:    Fear of current or ex partner: Not on file    Emotionally abused: Not on file    Physically abused: Not on file    Forced sexual activity: Not on file  Other Topics Concern  . Not on file  Social History Narrative   Lives with husand   Caffeine use: 1.5 cups coffee per day and pepsi   Right handed    Functional Status Survey:    Allergies  Allergen Reactions  . Shrimp [Shellfish Allergy] Nausea And Vomiting, Swelling and Other (See Comments)    Made her run a fever also  . Fish Allergy Nausea And Vomiting, Swelling and Other (See Comments)    Extremely sick  . Adhesive [Tape] Other (See Comments)    Tape BRUISES and TEARS THE SKIN; please use an alternative!!  . Other Other (See Comments)    Does not remember which "mycin" drug = yeast infection No seeds or nuts = Digestive isues    Pertinent  Health Maintenance Due   Topic Date Due  . INFLUENZA VACCINE  11/01/2018 (Originally 03/03/2018)  . DEXA SCAN  11/21/2018 (Originally 03/26/1994)  . PNA vac Low Risk Adult (2 of 2 - PCV13) 11/21/2018 (Originally 12/04/2015)    Medications: Outpatient Encounter Medications as of 11/01/2018  Medication Sig  . acetaminophen (TYLENOL) 500 MG tablet Take 1,000 mg by mouth every 8 (eight) hours.  Marland Kitchen albuterol (PROVENTIL HFA;VENTOLIN HFA) 108 (90 Base) MCG/ACT inhaler Inhale 2 puffs into the  lungs every 6 (six) hours as needed for wheezing or shortness of breath.  . Amino Acids-Protein Hydrolys (FEEDING SUPPLEMENT, PRO-STAT SUGAR FREE 64,) LIQD Take 30 mLs by mouth 2 (two) times daily.  Roseanne Kaufman Peru-Castor Oil (VENELEX) OINT Apply topically to sacrum and bilateral buttocks every shift and as needed for prevention  . clonazePAM (KLONOPIN) 0.5 MG tablet Take 0.5 tablets (0.25 mg total) by mouth daily as needed for up to 14 days. for anxiety  . clorazepate (TRANXENE-T) 3.75 MG tablet Take 1 tablet (3.75 mg total) by mouth at bedtime as needed for up to 14 days for anxiety.  Marland Kitchen diltiazem (CARDIZEM CD) 360 MG 24 hr capsule Take 1 capsule (360 mg total) by mouth daily.  Marland Kitchen ELIQUIS 2.5 MG TABS tablet Take 2.5 mg by mouth 2 (two) times daily.  . feeding supplement, ENSURE ENLIVE, (ENSURE ENLIVE) LIQD Take 237 mLs by mouth 2 (two) times daily between meals.  . folic acid (FOLVITE) 604 MCG tablet Take 400 mcg by mouth daily.  . furosemide (LASIX) 20 MG tablet Take 40 mg by mouth daily.  Marland Kitchen gabapentin (NEURONTIN) 100 MG capsule Take 200 mg by mouth at bedtime.   Marland Kitchen guaiFENesin (MUCINEX) 600 MG 12 hr tablet Take 1 tablet (600 mg total) by mouth 2 (two) times daily.  . hydrocortisone (ANUSOL-HC) 25 MG suppository Place 25 mg rectally daily as needed for hemorrhoids.   Marland Kitchen ipratropium-albuterol (DUONEB) 0.5-2.5 (3) MG/3ML SOLN Take 3 mLs by nebulization every 6 (six) hours.  Marland Kitchen levothyroxine (SYNTHROID, LEVOTHROID) 75 MCG tablet Take 75 mcg by mouth  daily.    . magnesium hydroxide (MILK OF MAGNESIA) 400 MG/5ML suspension Take 5 mLs by mouth daily as needed for mild constipation.   . Multiple Vitamins-Minerals (ICAPS AREDS 2) CAPS Take 1 tablet by mouth 2 (two) times daily.   . nitroGLYCERIN (NITROSTAT) 0.4 MG SL tablet Place 0.4 mg under the tongue every 5 (five) minutes as needed for chest pain.   . NON FORMULARY Diet type:  NAS  . ondansetron (ZOFRAN) 4 MG tablet Take 4 mg by mouth every 8 (eight) hours as needed for nausea or vomiting.  . OXYGEN Inhale 2 L into the lungs continuous.   Vladimir Faster Glycol-Propyl Glycol (SYSTANE) 0.4-0.3 % SOLN Place 1 drop into both eyes at bedtime as needed (for dry eyes).   Marland Kitchen PROCTOZONE-HC 2.5 % rectal cream Place 1 application rectally 2 (two) times daily.   . sodium chloride HYPERTONIC 3 % nebulizer solution Take 4 mLs by nebulization 2 (two) times daily.  . vitamin B-12 (CYANOCOBALAMIN) 1000 MCG tablet Take 1,000 mcg by mouth daily.  . Vitamins A & D (VITAMIN A & D) ointment Apply topically to bilateral feet and heels every shift and as needed for prevention  . [DISCONTINUED] trimethoprim-polymyxin b (POLYTRIM) ophthalmic solution Place 1 drop into the left eye See admin instructions. Instill 1 drop into the left eye three times a day for the 1st day and 1 drop four times a day on the 2nd day AFTER INJECTION   No facility-administered encounter medications on file as of 11/01/2018.      Review of Systems  Vitals:   11/01/18 1132  BP: (!) 160/74  Pulse: 72  Resp: 20  Temp: 97.8 F (36.6 C)  TempSrc: Oral  SpO2: 97%  Weight: 110 lb 6.4 oz (50.1 kg)  Height: 5\' 7"  (1.702 m)   Body mass index is 17.29 kg/m. Physical Exam Vitals signs reviewed.  Constitutional:  Appearance: Normal appearance.  HENT:     Head: Normocephalic.     Nose: Nose normal.     Mouth/Throat:     Mouth: Mucous membranes are moist.     Pharynx: Oropharynx is clear.  Eyes:     Pupils: Pupils are equal, round,  and reactive to light.  Neck:     Musculoskeletal: Neck supple.  Cardiovascular:     Rate and Rhythm: Normal rate. Rhythm irregular.     Pulses: Normal pulses.     Heart sounds: Normal heart sounds.  Pulmonary:     Effort: Pulmonary effort is normal.     Comments: Bilateral Decreased BS but no Wheezing or Rales Abdominal:     General: Abdomen is flat. Bowel sounds are normal. There is no distension.     Palpations: Abdomen is soft.     Tenderness: There is no abdominal tenderness.  Musculoskeletal:     Comments: Chronic Venous Changes with No Cellulitis and Chronic Edema  Skin:    General: Skin is warm and dry.  Neurological:     General: No focal deficit present.     Mental Status: She is alert and oriented to person, place, and time.  Psychiatric:        Mood and Affect: Mood normal.        Thought Content: Thought content normal.     Labs reviewed: Basic Metabolic Panel: Recent Labs    03/31/18 0254  04/06/18 0401  10/14/18 0513 10/15/18 0544  10/18/18 0444 10/19/18 0533 10/25/18 0600  NA 139   < > 137   < > 137 137   < > 138 139 139  K 3.2*   < > 3.8   < > 3.8 4.3   < > 3.8 4.1 4.0  CL 94*   < > 93*   < > 95* 93*   < > 88* 89* 94*  CO2 34*   < > 36*   < > 34* 36*   < > 42* 43* 35*  GLUCOSE 101*   < > 109*   < > 123* 132*   < > 116* 97 98  BUN 17   < > 17   < > 19 21   < > 28* 25* 19  CREATININE 0.88   < > 0.85   < > 0.82 0.74   < > 0.58 0.57 0.66  CALCIUM 8.6*   < > 8.5*   < > 8.3* 8.3*   < > 8.6* 8.3* 8.4*  MG 2.0  --  2.4  --   --  1.9  --   --   --   --   PHOS 4.5  --  3.0  --  3.7 3.4  --   --   --   --    < > = values in this interval not displayed.   Liver Function Tests: Recent Labs    10/11/18 2106 10/12/18 0426 10/14/18 0513 10/15/18 0544 10/25/18 0600  AST 39 29  --   --  27  ALT 28 24  --   --  23  ALKPHOS 97 84  --   --  83  BILITOT 0.6 0.5  --   --  0.8  PROT 7.6 6.7  --   --  7.1  ALBUMIN 3.3* 2.9* 3.1* 3.2* 3.3*   No results for  input(s): LIPASE, AMYLASE in the last 8760 hours. Recent Labs    04/04/18 0359  AMMONIA 50*  CBC: Recent Labs    06/28/18 0905  10/11/18 2106  10/15/18 0544 10/19/18 0533 10/25/18 0600  WBC 7.0   < > 8.6   < > 9.1 7.3 7.7  NEUTROABS 5.3  --  6.3  --   --   --  5.6  HGB 11.6*   < > 11.7*   < > 11.4* 11.4* 12.5  HCT 38.2   < > 36.5   < > 37.9 38.4 41.9  MCV 101.1*   < > 97.3   < > 103.0* 101.9* 100.7*  PLT 179   < > 330   < > 295 257 171   < > = values in this interval not displayed.   Cardiac Enzymes: Recent Labs    03/30/18 0155 03/30/18 0804 10/11/18 2106  TROPONINI <0.03 <0.03 <0.03   BNP: Invalid input(s): POCBNP CBG: Recent Labs    06/25/18 1959 10/17/18 2122  GLUCAP 102* 145*    Procedures and Imaging Studies During Stay: Dg Chest 2 View  Result Date: 10/11/2018 CLINICAL DATA:  Worsening shortness of breath for 3 days. Pain all over. History of COPD and CHF. EXAM: CHEST - 2 VIEW COMPARISON:  03/29/2018 FINDINGS: Cardiac enlargement. Bilateral perihilar airspace disease, likely representing edema. Increasing bilateral pleural effusions with basilar atelectasis or consolidation since previous study. Emphysematous changes in the lungs. No pneumothorax. Degenerative changes in the spine. IMPRESSION: Cardiac enlargement. Perihilar edema. Increasing bilateral pleural effusions and basilar atelectasis or consolidation since previous study. Electronically Signed   By: Lucienne Capers M.D.   On: 10/11/2018 22:13   US Venous Img Lower Unilateral Left  Result Date: 10/13/2018 CLINICAL DATA:  Left lower extremity pain and edema. History of fall on 06/28/2018. Evaluate for DVT. EXAM: LEFT LOWER EXTREMITY VENOUS DOPPLER ULTRASOUND TECHNIQUE: Gray-scale sonography with graded compression, as well as color Doppler and duplex ultrasound were performed to evaluate the lower extremity deep venous systems from the level of the common femoral vein and including the common femoral,  femoral, profunda femoral, popliteal and calf veins including the posterior tibial, peroneal and gastrocnemius veins when visible. The superficial great saphenous vein was also interrogated. Spectral Doppler was utilized to evaluate flow at rest and with distal augmentation maneuvers in the common femoral, femoral and popliteal veins. COMPARISON:  None. FINDINGS: Contralateral Common Femoral Vein: Respiratory phasicity is normal and symmetric with the symptomatic side. No evidence of thrombus. Normal compressibility. Common Femoral Vein: No evidence of thrombus. Normal compressibility, respiratory phasicity and response to augmentation. Saphenofemoral Junction: No evidence of thrombus. Normal compressibility and flow on color Doppler imaging. Profunda Femoral Vein: No evidence of thrombus. Normal compressibility and flow on color Doppler imaging. Femoral Vein: No evidence of thrombus. Normal compressibility, respiratory phasicity and response to augmentation. Popliteal Vein: No evidence of thrombus. Normal compressibility, respiratory phasicity and response to augmentation. Calf Veins: No evidence of thrombus. Normal compressibility and flow on color Doppler imaging. Superficial Great Saphenous Vein: No evidence of thrombus. Normal compressibility. Venous Reflux:  None. Other Findings: Note is made of an approximately 1.4 x 2.0 x 0.6 cm serpiginous anechoic fluid collection with the left popliteal fossa compatible with a Baker's cyst. IMPRESSION: 1. No evidence of DVT within the left lower extremity. 2. Incidentally noted approximately 2.0 cm anechoic left-sided Baker's cyst. Electronically Signed   By: Sandi Mariscal M.D.   On: 10/13/2018 13:59   Dg Chest Port 1 View  Result Date: 10/15/2018 CLINICAL DATA:  Acute on chronic diastolic heart failure EXAM: PORTABLE CHEST  1 VIEW COMPARISON:  10/13/2018 FINDINGS: Increased interstitial markings, favoring mild interstitial edema, unchanged. Small to moderate bilateral  pleural effusions, unchanged. No pneumothorax. Cardiomegaly. IMPRESSION: Cardiomegaly with mild interstitial edema and small to moderate bilateral pleural effusions, unchanged. Electronically Signed   By: Julian Hy M.D.   On: 10/15/2018 07:09   Dg Chest Port 1 View  Result Date: 10/13/2018 CLINICAL DATA:  Shortness of breath.  History of COPD. EXAM: PORTABLE CHEST 1 VIEW COMPARISON:  10/11/2018; 03/29/2018; chest CT-03/29/2018 FINDINGS: Interval increase in now likely moderate to large sized bilateral effusions with associated bibasilar consolidative opacities. Bilateral effusions obscure the bilateral heart borders. The pulmonary vasculature appears indistinct with cephalization flow. Minimal biapical pleuroparenchymal thickening. No definite pneumothorax. No acute osseous abnormalities. IMPRESSION: Findings most suggestive of worsening pulmonary edema with increase in moderate to large-sized bilateral effusions and associated worsening bibasilar consolidative opacities, atelectasis versus infiltrate Electronically Signed   By: Sandi Mariscal M.D.   On: 10/13/2018 14:00    Assessment/Plan:   COPD exacerbation With Hypercapnia Patient is back on her baseline oxygen.  But now she would use it all the time instead of PRN She refuses to use BiPAP for her hypercapnia  Chronic diastolic congestive heart failure  She is on her home dose of Lasix Her BUN and creatinine were stable in the facility Patient did have a bilateral pleural effusion on her x-ray She will need follow-up chest x-ray as outpatient in another 2 to 3 weeks Cellulitis of LE Dopplers were negative in hospital Was treated with Antibiotics in hospital She continues to have some redness in her legs which look more like a chronic venous changes I told patient to call her PCP if the redness or the pain gets worse.  Atrial fibrillation with RVR  On Diltiazem Continue On Eliquis  Nausea and vomiting Resolved eating well    hypothyroidism TSH level from 11/19 was normal Continue Same dose of Synthyrpoid  Idiopathic peripheral neuropathy Continue on Neurontin  Chronic anxiety On PRN Klonopin follow up with PCP Discharge she would be discharged home with her husband she will be followed by advanced and therapy.  She will also follow with her PCP  Future labs/tests needed:  Follow up Chest Xray in 4 weeks to follow her Pleural Effusion She has All DME at home  Discharge Time More then 35 min

## 2018-11-02 ENCOUNTER — Other Ambulatory Visit: Payer: Self-pay | Admitting: *Deleted

## 2018-11-02 NOTE — Patient Outreach (Signed)
[facility]) Care Management  11/02/2018  [name] 26-Jun-1927 [id number]   Collaboration with [facility] patient will discharge home with spouse 11/02/18 Patient has history of COPD and HF, she lives with spouse. She is on Oxygen use at home. She has had 2 admissions in 6 months.    Plan to refer to [facility] for transition of care calls to follow up after discharge. Royetta Crochet. Laymond Purser, MSN, RN, [employer] , [facility] [phone]) Business Cell  [phone]) Toll Free Office

## 2018-11-03 DIAGNOSIS — L03115 Cellulitis of right lower limb: Secondary | ICD-10-CM | POA: Diagnosis not present

## 2018-11-03 DIAGNOSIS — I2510 Atherosclerotic heart disease of native coronary artery without angina pectoris: Secondary | ICD-10-CM | POA: Diagnosis not present

## 2018-11-03 DIAGNOSIS — J449 Chronic obstructive pulmonary disease, unspecified: Secondary | ICD-10-CM | POA: Diagnosis not present

## 2018-11-03 DIAGNOSIS — G9009 Other idiopathic peripheral autonomic neuropathy: Secondary | ICD-10-CM | POA: Diagnosis not present

## 2018-11-03 DIAGNOSIS — I4891 Unspecified atrial fibrillation: Secondary | ICD-10-CM | POA: Diagnosis not present

## 2018-11-03 DIAGNOSIS — L03116 Cellulitis of left lower limb: Secondary | ICD-10-CM | POA: Diagnosis not present

## 2018-11-03 DIAGNOSIS — Z9981 Dependence on supplemental oxygen: Secondary | ICD-10-CM | POA: Diagnosis not present

## 2018-11-03 DIAGNOSIS — I5032 Chronic diastolic (congestive) heart failure: Secondary | ICD-10-CM | POA: Diagnosis not present

## 2018-11-04 ENCOUNTER — Other Ambulatory Visit: Payer: Self-pay | Admitting: *Deleted

## 2018-11-04 DIAGNOSIS — I4891 Unspecified atrial fibrillation: Secondary | ICD-10-CM | POA: Diagnosis not present

## 2018-11-04 DIAGNOSIS — I5032 Chronic diastolic (congestive) heart failure: Secondary | ICD-10-CM | POA: Diagnosis not present

## 2018-11-04 DIAGNOSIS — I2510 Atherosclerotic heart disease of native coronary artery without angina pectoris: Secondary | ICD-10-CM | POA: Diagnosis not present

## 2018-11-04 DIAGNOSIS — G9009 Other idiopathic peripheral autonomic neuropathy: Secondary | ICD-10-CM | POA: Diagnosis not present

## 2018-11-04 DIAGNOSIS — J449 Chronic obstructive pulmonary disease, unspecified: Secondary | ICD-10-CM | POA: Diagnosis not present

## 2018-11-04 DIAGNOSIS — L03115 Cellulitis of right lower limb: Secondary | ICD-10-CM | POA: Diagnosis not present

## 2018-11-04 NOTE — Patient Outreach (Signed)
Referral received from post acute care coordinator, pt discharged 11/02/18 from [facility] skilled nursing facility post hospitalization 3/10-3/19/20 for Acute URI, pt with diagnosis COPD, CHF, pneumonia, chronic anxiety, telephone call to pt for transition of care week 1, spoke with pt, HIPAA verified, pt gives permission for RN CM to speak with caregiver Milana Na due to pt not being able to hear well over the telephone, transition of care completed, reviewed medications over the phone, pt does not have duoneb for nebulizer and is not sure why she has no prescription, pt states she has pulmonologist in [facility] but cannot remember his full name only that he goes by Dr. Raliegh Ip.  Pt has home health RN, PT, and pt refused CNA, per Vikki Ports pt has no adult children and her spouse has adult children living out of state and these children arranged for pt and spouse to have care in the home Monday - Sunday , [place] stays [phone], pt, spouse stay alone at night there is another CG for evening, [place] states pt is private, pt did not want to let anyone know she does not qualify for medicaid, felt no one's business, pt does not feel like she needs assistance in the home and would rather have no one there, Vikki Ports states pt definitely needs assistance in the home with cooking, managing medications, pt has walker and does not use, PT is working with pt on this, no memory issues with pt- per [place].   RN CM placed order for [facility] pharmacist for polypharmacy, faxed barrier letter and today's note to primary MD Dr. Forde Dandy.  Outpatient Encounter Medications as of 11/04/2018  Medication Sig  . acetaminophen (TYLENOL) 500 MG tablet Take 1,000 mg by mouth every 8 (eight) hours.  Marland Kitchen albuterol (PROVENTIL HFA;VENTOLIN HFA) 108 (90 Base) MCG/ACT inhaler Inhale 2 puffs into the lungs every 6 (six) hours as needed for wheezing or shortness of breath.  . clonazePAM (KLONOPIN) 0.5 MG tablet Take 0.5 tablets (0.25 mg total) by mouth daily as  needed for up to 14 days. for anxiety  . clorazepate (TRANXENE-T) 3.75 MG tablet Take 1 tablet (3.75 mg total) by mouth at bedtime as needed for up to 14 days for anxiety.  Marland Kitchen diltiazem (CARDIZEM CD) 360 MG 24 hr capsule Take 1 capsule (360 mg total) by mouth daily.  Marland Kitchen ELIQUIS 2.5 MG TABS tablet Take 2.5 mg by mouth 2 (two) times daily.  . feeding supplement, ENSURE ENLIVE, (ENSURE ENLIVE) LIQD Take 237 mLs by mouth 2 (two) times daily between meals.  . folic acid (FOLVITE) 308 MCG tablet Take 400 mcg by mouth daily.  . furosemide (LASIX) 20 MG tablet Take 40 mg by mouth daily.  Marland Kitchen gabapentin (NEURONTIN) 100 MG capsule Take 200 mg by mouth at bedtime.   Marland Kitchen guaiFENesin (MUCINEX) 600 MG 12 hr tablet Take 1 tablet (600 mg total) by mouth 2 (two) times daily.  . hydrocortisone (ANUSOL-HC) 25 MG suppository Place 25 mg rectally daily as needed for hemorrhoids.   Marland Kitchen levothyroxine (SYNTHROID, LEVOTHROID) 75 MCG tablet Take 75 mcg by mouth daily.    . magnesium hydroxide (MILK OF MAGNESIA) 400 MG/5ML suspension Take 5 mLs by mouth daily as needed for mild constipation.   . Multiple Vitamins-Minerals (ICAPS AREDS 2) CAPS Take 1 tablet by mouth 2 (two) times daily.   . nitroGLYCERIN (NITROSTAT) 0.4 MG SL tablet Place 0.4 mg under the tongue every 5 (five) minutes as needed for chest pain.   Marland Kitchen ondansetron (ZOFRAN) 4 MG  tablet Take 4 mg by mouth every 8 (eight) hours as needed for nausea or vomiting.  . OXYGEN Inhale 2 L into the lungs continuous.   Vladimir Faster Glycol-Propyl Glycol (SYSTANE) 0.4-0.3 % SOLN Place 1 drop into both eyes at bedtime as needed (for dry eyes).   Marland Kitchen PROCTOZONE-HC 2.5 % rectal cream Place 1 application rectally 2 (two) times daily.   . sodium chloride HYPERTONIC 3 % nebulizer solution Take 4 mLs by nebulization 2 (two) times daily.  . vitamin B-12 (CYANOCOBALAMIN) 1000 MCG tablet Take 1,000 mcg by mouth daily.  . Vitamins A & D (VITAMIN A & D) ointment Apply topically to bilateral feet  and heels every shift and as needed for prevention  . Amino Acids-Protein Hydrolys (FEEDING SUPPLEMENT, PRO-STAT SUGAR FREE 64,) LIQD Take 30 mLs by mouth 2 (two) times daily. (Patient not taking: Reported on 11/04/2018)  . Balsam [place]-Castor Oil (VENELEX) OINT Apply topically to sacrum and bilateral buttocks every shift and as needed for prevention  . ipratropium-albuterol (DUONEB) 0.5-2.5 (3) MG/3ML SOLN Take 3 mLs by nebulization every 6 (six) hours. (Patient not taking: Reported on 11/04/2018)  . NON FORMULARY Diet type:  NAS   No facility-administered encounter medications on file as of 11/04/2018.    THN CM Care Plan Problem One     Most Recent Value  Care Plan Problem One  Knowledge deficit related to COPD  Role Documenting the Problem One  Care Management Coordinator  Care Plan for Problem One  Active  THN Long Term Goal   Pt will verbalize/ demonstrate improved self care related to COPD, CHF within 60 days.  THN Long Term Goal Start Date  11/04/18  Interventions for Problem One Long Term Goal  RN CM reviewed plan of care with caregiver Vikki Ports, mailed successful outreach letter with 24 hour nurse line magnet, consent form.  THN CM Short Term Goal #1   Pt, caregiver will verbalize COPD action plan within 30 days  THN CM Short Term Goal #1 Start Date  11/04/18  Interventions for Short Term Goal #1  RNCM reviewed COPD action plan  THN CM Short Term Goal #2   Pt, caregiver will verbalize CHF action plan within 30 days.  THN CM Short Term Goal #2 Start Date  11/04/18  Interventions for Short Term Goal #2  RN CM reviewed CHF action plan with emphasis on yellow zone  THN CM Short Term Goal #3  Pt will weigh daily and record within 30 days  THN CM Short Term Goal #3 Start Date  11/04/18  Interventions for Short Tern Goal #3  Pt has digital scale, RN CM ask CG to assist pt to weigh daily and record and explained reason for doing so      PLAN Continue weekly transition of care Complete  required assessments Pt is to start weighing daily and recording Collaborate with pharmacy as needed  [name] [facility], [place] [phone]

## 2018-11-07 ENCOUNTER — Ambulatory Visit: Payer: Self-pay | Admitting: Pharmacist

## 2018-11-07 ENCOUNTER — Telehealth: Payer: Self-pay | Admitting: Pulmonary Disease

## 2018-11-07 ENCOUNTER — Other Ambulatory Visit: Payer: Self-pay | Admitting: Pharmacist

## 2018-11-07 MED ORDER — IPRATROPIUM-ALBUTEROL 0.5-2.5 (3) MG/3ML IN SOLN: 3.0000 mL | Freq: Four times a day (QID) | RESPIRATORY_TRACT | 5 refills | Status: DC

## 2018-11-07 NOTE — Patient Outreach (Signed)
[facility]) Care Management  [place]   11/09/2018  [name] 1927/02/10 [id number]  Reason for referral: Medication Review  Referral source: [facility] RN Current insurance: BCBS  PMHx includes but not limited to:  Afib, CAD, CHF, COPD, hypothyroidsim  Outreach:  Successful call to [name] and Clinical biochemist (permission granted to speak with) with HIPAA identifiers verified. Patient was recently discharged from SNF in March 2020.  She remains on continuous O2 (3L), however patient remains SOB at baseline.  Caregiver states they are out of nebulized medications. They do have a working nebulizer in the home.  She continues to take other medications as prescribed.  Reviewed medications with caregiver.  She continues to give medications as prescribed.  Successful care coordination call to [place] (Dr. Maylon Peppers) and left a message with RN, Santiago Glad regarding new RX for DuoNebs to be called in to [facility] in [place] per caregiver request.  Call returned from [facility] stating prescription for Duonebs has been called in.  Relayed message back to caregiver.  No other needed identified.  Objective:  Medications Reviewed Today    Reviewed by Lavera Guise, [facility] (Pharmacist) on 11/09/18 at [phone]  Med List Status: <None>  Medication Order Taking? Sig Documenting Provider Last Dose Status Informant  acetaminophen (TYLENOL) 500 MG tablet [id number] Yes Take 1,000 mg by mouth every 8 (eight) hours. [provider] Taking Active   albuterol (PROVENTIL HFA;VENTOLIN HFA) 108 (90 Base) MCG/ACT inhaler [id number] Yes Inhale 2 puffs into the lungs every 6 (six) hours as needed for wheezing or shortness of breath. [provider] Taking Active        Patient not taking:       Discontinued 11/09/18 0923 (Change in therapy)   [address] Oil [facility]) OINT [id number] Yes Apply topically to sacrum and bilateral buttocks every  shift and as needed for prevention [provider] Taking Active         Discontinued 11/09/18 0923 (Completed Course)   diltiazem (CARDIZEM CD) 360 MG 24 hr capsule [id number] Yes Take 1 capsule (360 mg total) by mouth daily. Bonnielee Haff, MD Taking Active Self  ELIQUIS 2.5 MG TABS tablet [id number] Yes Take 2.5 mg by mouth 2 (two) times daily. [provider] Taking Active Self  feeding supplement, ENSURE ENLIVE, (ENSURE ENLIVE) LIQD [id number] Yes Take 237 mLs by mouth 2 (two) times daily between meals. Kathie Dike, MD Taking Active   folic acid (FOLVITE) 956 MCG tablet [id number] Yes Take 400 mcg by mouth daily. [provider] Taking Active Self           Med Note Megan Salon, ROBIN   Fri Oct 21, 2018  9:23 AM)    furosemide (LASIX) 20 MG tablet [id number] Yes Take 40 mg by mouth daily. [provider] Taking Active   gabapentin (NEURONTIN) 100 MG capsule [id number] Yes Take 200 mg by mouth at bedtime.  [provider] Taking Active Self  guaiFENesin (MUCINEX) 600 MG 12 hr tablet [id number] Yes Take 1 tablet (600 mg total) by mouth 2 (two) times daily. Kathie Dike, MD Taking Active   hydrocortisone (ANUSOL-HC) 25 MG suppository [id number] Yes Place 25 mg rectally daily as needed for hemorrhoids.  [provider] Taking Active Self  ipratropium-albuterol (DUONEB) 0.5-2.5 (3) MG/3ML SOLN [id number] Yes Take 3 mLs by nebulization every 6 (six) hours. Dx:J44.9 Juanito Doom, MD Taking Active   levothyroxine (SYNTHROID, LEVOTHROID) 75 MCG tablet [id number] Yes Take 75 mcg  by mouth daily.   [provider] Taking Active Self  magnesium hydroxide (MILK OF MAGNESIA) 400 MG/5ML suspension [id number] Yes Take 5 mLs by mouth daily as needed for mild constipation.  [provider] Taking Active Self  Multiple Vitamins-Minerals (ICAPS AREDS 2) CAPS [id number] Yes Take 1 tablet by mouth 2 (two) times daily.  [provider] Taking  Active Self  nitroGLYCERIN (NITROSTAT) 0.4 MG SL tablet [id number] Yes Place 0.4 mg under the tongue every 5 (five) minutes as needed for chest pain.  [provider] Taking Active Self  Baruch Gouty [id number] Yes Diet type:  NAS [provider] Taking Active   ondansetron (ZOFRAN) 4 MG tablet [id number] Yes Take 4 mg by mouth every 8 (eight) hours as needed for nausea or vomiting. [provider] Taking Active   OXYGEN [id number] Yes Inhale 2 L into the lungs continuous.  [provider] Taking Active Self  Polyethyl Glycol-Propyl Glycol (SYSTANE) 0.4-0.3 % SOLN [id number] Yes Place 1 drop into both eyes at bedtime as needed (for dry eyes).  [provider] Taking Active Self  PROCTOZONE-HC 2.5 % rectal cream [id number] Yes Place 1 application rectally 2 (two) times daily.  [provider] Taking Active Self  sodium chloride HYPERTONIC 3 % nebulizer solution [id number] Yes Take 4 mLs by nebulization 2 (two) times daily. [provider] Taking Active   vitamin B-12 (CYANOCOBALAMIN) 1000 MCG tablet [id number] Yes Take 1,000 mcg by mouth daily. [provider] Taking Active Self  Vitamins A & D (VITAMIN A & D) ointment [id number] Yes Apply topically to bilateral feet and heels every shift and as needed for prevention [provider] Taking Active   Med List Note Gerlene Burdock 03/29/18 1651):           Assessment:  Drugs sorted by system:  Neurologic/Psychologic: gabapentin, alprazolam, clonazepam  Cardiovascular: diltiazem, Eliquis, Lasix  Pulmonary/Allergy: 3% nebs, Duonebs, Albuterol rescue inhaler, Mucinex PRN  Gastrointestinal: milk of mag, zofran  Endocrine: levothyroxine  Topical: vitA&D ointment, Proctozone cream, Venelex oint, Systane eye  Pain: APAP PRN  Vitamins/Minerals/Supplements: vitB, MVI, flovite  Medication Review Findings:  . Decrease the use of PRN benzodiazepines   Plan: . Will  close [facility] pharmacy case as no further medication needs identified at this time.  I am happy to assist in the future as needed.   . I will inform [facility] RN CM of case closure.     Regina Eck, PharmD, [place]  [phone]

## 2018-11-07 NOTE — Telephone Encounter (Signed)
Rx sent duoneb ATC Almyra Free to make aware. Left message

## 2018-11-08 DIAGNOSIS — G9009 Other idiopathic peripheral autonomic neuropathy: Secondary | ICD-10-CM | POA: Diagnosis not present

## 2018-11-08 DIAGNOSIS — I4891 Unspecified atrial fibrillation: Secondary | ICD-10-CM | POA: Diagnosis not present

## 2018-11-08 DIAGNOSIS — L03115 Cellulitis of right lower limb: Secondary | ICD-10-CM | POA: Diagnosis not present

## 2018-11-08 DIAGNOSIS — J449 Chronic obstructive pulmonary disease, unspecified: Secondary | ICD-10-CM | POA: Diagnosis not present

## 2018-11-08 DIAGNOSIS — I5032 Chronic diastolic (congestive) heart failure: Secondary | ICD-10-CM | POA: Diagnosis not present

## 2018-11-08 DIAGNOSIS — I2510 Atherosclerotic heart disease of native coronary artery without angina pectoris: Secondary | ICD-10-CM | POA: Diagnosis not present

## 2018-11-09 ENCOUNTER — Telehealth: Payer: Self-pay

## 2018-11-09 NOTE — Telephone Encounter (Signed)
I contacted the patient in regards to her 6 month f/u appt scheduled for 11/10/18 with Dr. Jannifer Franklin. Patient was advise due to current COVID 19 pandemic, our office is severely reducing in office visits for at least the next 2 weeks, in order to minimize the risk to our patients and healthcare providers.   Pt declined a telephone call and requested to be called back once the covid 19 concerns/restrictions have been lifted.   Pateint has been added to wait list.

## 2018-11-10 ENCOUNTER — Ambulatory Visit: Payer: Medicare Other | Admitting: Neurology

## 2018-11-10 ENCOUNTER — Other Ambulatory Visit: Payer: Self-pay | Admitting: *Deleted

## 2018-11-10 DIAGNOSIS — I5032 Chronic diastolic (congestive) heart failure: Secondary | ICD-10-CM | POA: Diagnosis not present

## 2018-11-10 DIAGNOSIS — I4891 Unspecified atrial fibrillation: Secondary | ICD-10-CM | POA: Diagnosis not present

## 2018-11-10 DIAGNOSIS — I2510 Atherosclerotic heart disease of native coronary artery without angina pectoris: Secondary | ICD-10-CM | POA: Diagnosis not present

## 2018-11-10 DIAGNOSIS — G9009 Other idiopathic peripheral autonomic neuropathy: Secondary | ICD-10-CM | POA: Diagnosis not present

## 2018-11-10 DIAGNOSIS — L03115 Cellulitis of right lower limb: Secondary | ICD-10-CM | POA: Diagnosis not present

## 2018-11-10 DIAGNOSIS — J449 Chronic obstructive pulmonary disease, unspecified: Secondary | ICD-10-CM | POA: Diagnosis not present

## 2018-11-10 NOTE — Patient Outreach (Signed)
Outreach call to patient/ CG for telephone assessment (complete required assessments), no answer to telephone and states "voicemail not set up"  Rn CM mailed unsuccessful outreach letter to pt home.  PLAN Outreach pt in 3-4 business days Complete required assessments  [name] [facility], [place] Coordinator [phone]

## 2018-11-11 DIAGNOSIS — G9009 Other idiopathic peripheral autonomic neuropathy: Secondary | ICD-10-CM | POA: Diagnosis not present

## 2018-11-11 DIAGNOSIS — I5032 Chronic diastolic (congestive) heart failure: Secondary | ICD-10-CM | POA: Diagnosis not present

## 2018-11-11 DIAGNOSIS — I2510 Atherosclerotic heart disease of native coronary artery without angina pectoris: Secondary | ICD-10-CM | POA: Diagnosis not present

## 2018-11-11 DIAGNOSIS — I4891 Unspecified atrial fibrillation: Secondary | ICD-10-CM | POA: Diagnosis not present

## 2018-11-11 DIAGNOSIS — L03115 Cellulitis of right lower limb: Secondary | ICD-10-CM | POA: Diagnosis not present

## 2018-11-11 DIAGNOSIS — J449 Chronic obstructive pulmonary disease, unspecified: Secondary | ICD-10-CM | POA: Diagnosis not present

## 2018-11-14 ENCOUNTER — Encounter (HOSPITAL_COMMUNITY): Payer: Self-pay

## 2018-11-14 ENCOUNTER — Emergency Department (HOSPITAL_COMMUNITY): Payer: Medicare Other

## 2018-11-14 ENCOUNTER — Other Ambulatory Visit: Payer: Self-pay

## 2018-11-14 ENCOUNTER — Inpatient Hospital Stay (HOSPITAL_COMMUNITY)
Admission: EM | Admit: 2018-11-14 | Discharge: 2018-11-22 | DRG: 291 | Disposition: A | Discharge status: Hospice | Payer: Medicare Other | Attending: Family Medicine | Admitting: Family Medicine

## 2018-11-14 DIAGNOSIS — G629 Polyneuropathy, unspecified: Secondary | ICD-10-CM | POA: Diagnosis present

## 2018-11-14 DIAGNOSIS — G9009 Other idiopathic peripheral autonomic neuropathy: Secondary | ICD-10-CM | POA: Diagnosis not present

## 2018-11-14 DIAGNOSIS — K5909 Other constipation: Secondary | ICD-10-CM | POA: Diagnosis present

## 2018-11-14 DIAGNOSIS — I5033 Acute on chronic diastolic (congestive) heart failure: Secondary | ICD-10-CM | POA: Diagnosis not present

## 2018-11-14 DIAGNOSIS — Z66 Do not resuscitate: Secondary | ICD-10-CM | POA: Diagnosis not present

## 2018-11-14 DIAGNOSIS — Z1159 Encounter for screening for other viral diseases: Secondary | ICD-10-CM

## 2018-11-14 DIAGNOSIS — Z79899 Other long term (current) drug therapy: Secondary | ICD-10-CM

## 2018-11-14 DIAGNOSIS — R531 Weakness: Secondary | ICD-10-CM

## 2018-11-14 DIAGNOSIS — E876 Hypokalemia: Secondary | ICD-10-CM | POA: Diagnosis not present

## 2018-11-14 DIAGNOSIS — Z79890 Hormone replacement therapy (postmenopausal): Secondary | ICD-10-CM

## 2018-11-14 DIAGNOSIS — I480 Paroxysmal atrial fibrillation: Secondary | ICD-10-CM | POA: Diagnosis present

## 2018-11-14 DIAGNOSIS — Z7189 Other specified counseling: Secondary | ICD-10-CM

## 2018-11-14 DIAGNOSIS — R0602 Shortness of breath: Secondary | ICD-10-CM | POA: Diagnosis not present

## 2018-11-14 DIAGNOSIS — E039 Hypothyroidism, unspecified: Secondary | ICD-10-CM | POA: Diagnosis present

## 2018-11-14 DIAGNOSIS — E860 Dehydration: Secondary | ICD-10-CM | POA: Diagnosis not present

## 2018-11-14 DIAGNOSIS — Z800 Family history of malignant neoplasm of digestive organs: Secondary | ICD-10-CM

## 2018-11-14 DIAGNOSIS — M25551 Pain in right hip: Secondary | ICD-10-CM | POA: Diagnosis present

## 2018-11-14 DIAGNOSIS — Z7901 Long term (current) use of anticoagulants: Secondary | ICD-10-CM

## 2018-11-14 DIAGNOSIS — G8929 Other chronic pain: Secondary | ICD-10-CM | POA: Diagnosis present

## 2018-11-14 DIAGNOSIS — I509 Heart failure, unspecified: Secondary | ICD-10-CM

## 2018-11-14 DIAGNOSIS — E038 Other specified hypothyroidism: Secondary | ICD-10-CM

## 2018-11-14 DIAGNOSIS — J9611 Chronic respiratory failure with hypoxia: Secondary | ICD-10-CM | POA: Diagnosis not present

## 2018-11-14 DIAGNOSIS — L03115 Cellulitis of right lower limb: Secondary | ICD-10-CM | POA: Diagnosis not present

## 2018-11-14 DIAGNOSIS — R05 Cough: Secondary | ICD-10-CM | POA: Diagnosis not present

## 2018-11-14 DIAGNOSIS — I25118 Atherosclerotic heart disease of native coronary artery with other forms of angina pectoris: Secondary | ICD-10-CM | POA: Diagnosis present

## 2018-11-14 DIAGNOSIS — I2510 Atherosclerotic heart disease of native coronary artery without angina pectoris: Secondary | ICD-10-CM | POA: Diagnosis not present

## 2018-11-14 DIAGNOSIS — J90 Pleural effusion, not elsewhere classified: Secondary | ICD-10-CM

## 2018-11-14 DIAGNOSIS — I083 Combined rheumatic disorders of mitral, aortic and tricuspid valves: Secondary | ICD-10-CM | POA: Diagnosis present

## 2018-11-14 DIAGNOSIS — I272 Pulmonary hypertension, unspecified: Secondary | ICD-10-CM | POA: Diagnosis present

## 2018-11-14 DIAGNOSIS — Z681 Body mass index (BMI) 19 or less, adult: Secondary | ICD-10-CM | POA: Diagnosis not present

## 2018-11-14 DIAGNOSIS — F419 Anxiety disorder, unspecified: Secondary | ICD-10-CM | POA: Diagnosis present

## 2018-11-14 DIAGNOSIS — J449 Chronic obstructive pulmonary disease, unspecified: Secondary | ICD-10-CM | POA: Diagnosis not present

## 2018-11-14 DIAGNOSIS — Z515 Encounter for palliative care: Secondary | ICD-10-CM

## 2018-11-14 DIAGNOSIS — M797 Fibromyalgia: Secondary | ICD-10-CM | POA: Diagnosis present

## 2018-11-14 DIAGNOSIS — R64 Cachexia: Secondary | ICD-10-CM | POA: Diagnosis present

## 2018-11-14 DIAGNOSIS — Z9889 Other specified postprocedural states: Secondary | ICD-10-CM

## 2018-11-14 DIAGNOSIS — Z9842 Cataract extraction status, left eye: Secondary | ICD-10-CM

## 2018-11-14 DIAGNOSIS — I4891 Unspecified atrial fibrillation: Secondary | ICD-10-CM | POA: Diagnosis not present

## 2018-11-14 DIAGNOSIS — J9612 Chronic respiratory failure with hypercapnia: Secondary | ICD-10-CM | POA: Diagnosis not present

## 2018-11-14 DIAGNOSIS — R0689 Other abnormalities of breathing: Secondary | ICD-10-CM | POA: Diagnosis not present

## 2018-11-14 DIAGNOSIS — E43 Unspecified severe protein-calorie malnutrition: Secondary | ICD-10-CM | POA: Diagnosis present

## 2018-11-14 DIAGNOSIS — Z91048 Other nonmedicinal substance allergy status: Secondary | ICD-10-CM

## 2018-11-14 DIAGNOSIS — E785 Hyperlipidemia, unspecified: Secondary | ICD-10-CM | POA: Diagnosis present

## 2018-11-14 DIAGNOSIS — M810 Age-related osteoporosis without current pathological fracture: Secondary | ICD-10-CM | POA: Diagnosis present

## 2018-11-14 DIAGNOSIS — R069 Unspecified abnormalities of breathing: Secondary | ICD-10-CM | POA: Diagnosis not present

## 2018-11-14 DIAGNOSIS — J810 Acute pulmonary edema: Secondary | ICD-10-CM

## 2018-11-14 DIAGNOSIS — Z9981 Dependence on supplemental oxygen: Secondary | ICD-10-CM

## 2018-11-14 DIAGNOSIS — R110 Nausea: Secondary | ICD-10-CM | POA: Diagnosis not present

## 2018-11-14 DIAGNOSIS — Z91013 Allergy to seafood: Secondary | ICD-10-CM

## 2018-11-14 DIAGNOSIS — J918 Pleural effusion in other conditions classified elsewhere: Secondary | ICD-10-CM | POA: Diagnosis present

## 2018-11-14 DIAGNOSIS — I110 Hypertensive heart disease with heart failure: Secondary | ICD-10-CM | POA: Diagnosis not present

## 2018-11-14 DIAGNOSIS — Z87440 Personal history of urinary (tract) infections: Secondary | ICD-10-CM

## 2018-11-14 DIAGNOSIS — M1990 Unspecified osteoarthritis, unspecified site: Secondary | ICD-10-CM | POA: Diagnosis present

## 2018-11-14 DIAGNOSIS — D539 Nutritional anemia, unspecified: Secondary | ICD-10-CM | POA: Diagnosis present

## 2018-11-14 DIAGNOSIS — M549 Dorsalgia, unspecified: Secondary | ICD-10-CM | POA: Diagnosis present

## 2018-11-14 DIAGNOSIS — Z96641 Presence of right artificial hip joint: Secondary | ICD-10-CM

## 2018-11-14 DIAGNOSIS — Z8249 Family history of ischemic heart disease and other diseases of the circulatory system: Secondary | ICD-10-CM

## 2018-11-14 DIAGNOSIS — I5032 Chronic diastolic (congestive) heart failure: Secondary | ICD-10-CM | POA: Diagnosis not present

## 2018-11-14 DIAGNOSIS — F411 Generalized anxiety disorder: Secondary | ICD-10-CM | POA: Diagnosis present

## 2018-11-14 LAB — CBC WITH DIFFERENTIAL/PLATELET
Abs Immature Granulocytes: 0.02 10*3/uL (ref 0.00–0.07)
Basophils Absolute: 0 10*3/uL (ref 0.0–0.1)
Basophils Relative: 0 %
Eosinophils Absolute: 0 10*3/uL (ref 0.0–0.5)
Eosinophils Relative: 0 %
HCT: 38.5 % (ref 36.0–46.0)
Hemoglobin: 11.8 g/dL — ABNORMAL LOW (ref 12.0–15.0)
Immature Granulocytes: 0 %
Lymphocytes Relative: 13 %
Lymphs Abs: 1 10*3/uL (ref 0.7–4.0)
MCH: 31 pg (ref 26.0–34.0)
MCHC: 30.6 g/dL (ref 30.0–36.0)
MCV: 101 fL — ABNORMAL HIGH (ref 80.0–100.0)
Monocytes Absolute: 0.9 10*3/uL (ref 0.1–1.0)
Monocytes Relative: 12 %
Neutro Abs: 5.7 10*3/uL (ref 1.7–7.7)
Neutrophils Relative %: 75 %
Platelets: 222 10*3/uL (ref 150–400)
RBC: 3.81 MIL/uL — ABNORMAL LOW (ref 3.87–5.11)
RDW: 15.2 % (ref 11.5–15.5)
WBC: 7.6 10*3/uL (ref 4.0–10.5)
nRBC: 0 % (ref 0.0–0.2)

## 2018-11-14 LAB — URINALYSIS, ROUTINE W REFLEX MICROSCOPIC
Bilirubin Urine: NEGATIVE
Glucose, UA: NEGATIVE mg/dL
Hgb urine dipstick: NEGATIVE
Ketones, ur: NEGATIVE mg/dL
Leukocytes,Ua: NEGATIVE
Nitrite: NEGATIVE
Protein, ur: NEGATIVE mg/dL
Specific Gravity, Urine: 1.005 (ref 1.005–1.030)
pH: 7 (ref 5.0–8.0)

## 2018-11-14 LAB — COMPREHENSIVE METABOLIC PANEL
ALT: 27 U/L (ref 0–44)
AST: 31 U/L (ref 15–41)
Albumin: 3.7 g/dL (ref 3.5–5.0)
Alkaline Phosphatase: 101 U/L (ref 38–126)
Anion gap: 10 (ref 5–15)
BUN: 17 mg/dL (ref 8–23)
CO2: 41 mmol/L — ABNORMAL HIGH (ref 22–32)
Calcium: 8.2 mg/dL — ABNORMAL LOW (ref 8.9–10.3)
Chloride: 85 mmol/L — ABNORMAL LOW (ref 98–111)
Creatinine, Ser: 0.69 mg/dL (ref 0.44–1.00)
GFR calc Af Amer: >60 mL/min (ref >60)
GFR calc non Af Amer: >60 mL/min (ref >60)
Glucose, Bld: 103 mg/dL — ABNORMAL HIGH (ref 70–99)
Potassium: 2.9 mmol/L — ABNORMAL LOW (ref 3.5–5.1)
Sodium: 136 mmol/L (ref 135–145)
Total Bilirubin: 1.2 mg/dL (ref 0.3–1.2)
Total Protein: 7.3 g/dL (ref 6.5–8.1)

## 2018-11-14 LAB — LACTIC ACID, PLASMA: Lactic Acid, Venous: 1.2 mmol/L (ref 0.5–1.9)

## 2018-11-14 LAB — PROTIME-INR
INR: 1.3 — ABNORMAL HIGH (ref 0.8–1.2)
Prothrombin Time: 16 seconds — ABNORMAL HIGH (ref 11.4–15.2)

## 2018-11-14 LAB — TROPONIN I: Troponin I: 0.03 ng/mL (ref <0.03)

## 2018-11-14 LAB — BRAIN NATRIURETIC PEPTIDE: B Natriuretic Peptide: 578 pg/mL — ABNORMAL HIGH (ref 0.0–100.0)

## 2018-11-14 LAB — MAGNESIUM: Magnesium: 2 mg/dL (ref 1.7–2.4)

## 2018-11-14 MED ORDER — SODIUM CHLORIDE 0.9% FLUSH
3.0000 mL | Freq: Two times a day (BID) | INTRAVENOUS | Status: DC
  Administered 2018-11-14 – 2018-11-16 (×5): 3 mL via INTRAVENOUS
  Administered 2018-11-17: 10:00:00 via INTRAVENOUS
  Administered 2018-11-17 – 2018-11-22 (×10): 3 mL via INTRAVENOUS

## 2018-11-14 MED ORDER — ALBUTEROL SULFATE (2.5 MG/3ML) 0.083% IN NEBU
3.0000 mL | INHALATION_SOLUTION | Freq: Four times a day (QID) | RESPIRATORY_TRACT | Status: DC | PRN
  Administered 2018-11-16: 3 mL via RESPIRATORY_TRACT
  Filled 2018-11-14: qty 3

## 2018-11-14 MED ORDER — FUROSEMIDE 10 MG/ML IJ SOLN
40.0000 mg | Freq: Once | INTRAMUSCULAR | Status: AC
  Administered 2018-11-14: 40 mg via INTRAVENOUS
  Filled 2018-11-14: qty 4

## 2018-11-14 MED ORDER — GABAPENTIN 100 MG PO CAPS
200.0000 mg | ORAL_CAPSULE | Freq: Every day | ORAL | Status: DC
  Administered 2018-11-14 – 2018-11-18 (×5): 200 mg via ORAL
  Filled 2018-11-14 (×5): qty 2

## 2018-11-14 MED ORDER — ENSURE ENLIVE PO LIQD
237.0000 mL | Freq: Two times a day (BID) | ORAL | Status: DC
  Administered 2018-11-15 – 2018-11-21 (×12): 237 mL via ORAL

## 2018-11-14 MED ORDER — IPRATROPIUM-ALBUTEROL 0.5-2.5 (3) MG/3ML IN SOLN
3.0000 mL | Freq: Four times a day (QID) | RESPIRATORY_TRACT | Status: DC
  Administered 2018-11-14 – 2018-11-15 (×3): 3 mL via RESPIRATORY_TRACT
  Filled 2018-11-14 (×3): qty 3

## 2018-11-14 MED ORDER — ACETAMINOPHEN 325 MG PO TABS
650.0000 mg | ORAL_TABLET | ORAL | Status: DC | PRN
  Administered 2018-11-18 – 2018-11-22 (×3): 650 mg via ORAL
  Filled 2018-11-14 (×3): qty 2

## 2018-11-14 MED ORDER — LEVOTHYROXINE SODIUM 75 MCG PO TABS
75.0000 ug | ORAL_TABLET | Freq: Every day | ORAL | Status: DC
  Administered 2018-11-15 – 2018-11-22 (×8): 75 ug via ORAL
  Filled 2018-11-14 (×8): qty 1

## 2018-11-14 MED ORDER — VITAMIN B-12 1000 MCG PO TABS
1000.0000 ug | ORAL_TABLET | Freq: Every day | ORAL | Status: DC
  Administered 2018-11-15 – 2018-11-22 (×8): 1000 ug via ORAL
  Filled 2018-11-14 (×9): qty 1

## 2018-11-14 MED ORDER — SODIUM CHLORIDE 0.9 % IV SOLN: 250.0000 mL | INTRAVENOUS | Status: DC | PRN

## 2018-11-14 MED ORDER — FOLIC ACID 1 MG PO TABS
1.0000 mg | ORAL_TABLET | Freq: Every day | ORAL | Status: DC
  Administered 2018-11-15 – 2018-11-22 (×8): 1 mg via ORAL
  Filled 2018-11-14 (×8): qty 1

## 2018-11-14 MED ORDER — POTASSIUM CHLORIDE CRYS ER 20 MEQ PO TBCR
40.0000 meq | EXTENDED_RELEASE_TABLET | Freq: Once | ORAL | Status: AC
  Administered 2018-11-14: 40 meq via ORAL
  Filled 2018-11-14: qty 2

## 2018-11-14 MED ORDER — APIXABAN 2.5 MG PO TABS
2.5000 mg | ORAL_TABLET | Freq: Two times a day (BID) | ORAL | Status: DC
  Administered 2018-11-14 – 2018-11-19 (×10): 2.5 mg via ORAL
  Filled 2018-11-14 (×10): qty 1

## 2018-11-14 MED ORDER — POTASSIUM CHLORIDE 10 MEQ/100ML IV SOLN
10.0000 meq | INTRAVENOUS | Status: AC
  Administered 2018-11-14 (×2): 10 meq via INTRAVENOUS
  Filled 2018-11-14 (×2): qty 100

## 2018-11-14 MED ORDER — DILTIAZEM HCL ER COATED BEADS 180 MG PO CP24
360.0000 mg | ORAL_CAPSULE | Freq: Every day | ORAL | Status: DC
  Administered 2018-11-15 – 2018-11-22 (×8): 360 mg via ORAL
  Filled 2018-11-14 (×8): qty 2

## 2018-11-14 MED ORDER — FUROSEMIDE 10 MG/ML IJ SOLN
40.0000 mg | Freq: Two times a day (BID) | INTRAMUSCULAR | Status: DC
  Administered 2018-11-15 – 2018-11-16 (×3): 40 mg via INTRAVENOUS
  Filled 2018-11-14 (×3): qty 4

## 2018-11-14 MED ORDER — SODIUM CHLORIDE 0.9% FLUSH: 3.0000 mL | INTRAVENOUS | Status: DC | PRN

## 2018-11-14 MED ORDER — ONDANSETRON HCL 4 MG/2ML IJ SOLN: 4.0000 mg | Freq: Four times a day (QID) | INTRAMUSCULAR | Status: DC | PRN

## 2018-11-14 MED ORDER — POTASSIUM CHLORIDE 10 MEQ/100ML IV SOLN: 10.0000 meq | INTRAVENOUS | Status: DC

## 2018-11-14 MED ORDER — GUAIFENESIN ER 600 MG PO TB12: 600.0000 mg | ORAL_TABLET | Freq: Two times a day (BID) | ORAL | Status: DC | PRN

## 2018-11-14 MED ORDER — CLONAZEPAM 0.5 MG PO TABS: 0.5000 mg | ORAL_TABLET | Freq: Two times a day (BID) | ORAL | Status: DC | PRN

## 2018-11-14 NOTE — ED Notes (Signed)
Date and time results received: 11/14/18 1953  Test: Troponin Critical Value: 0.03  Name of Provider Notified: Thurnell Garbe  Orders Received? Or Actions Taken?: n/a

## 2018-11-14 NOTE — Progress Notes (Signed)
Consult request has been received. CSW attempting to follow up at present time  Emeric Novinger M. Sabatino Williard LCSWA Transitions of Care  Clinical Social Worker  Ph: [phone] 

## 2018-11-14 NOTE — Progress Notes (Signed)
Pt now off contact precautions. Pt moved to floor, room 312. CSW attempted to visit Pt at bedside to address consult and discharge needs, but Pt receiving patient care at time of visit. Will leave handoff for 1st shift social work.   [place] Transitions of Care  Clinical Social Worker  Ph: [phone]

## 2018-11-14 NOTE — ED Provider Notes (Signed)
[facility] EMERGENCY DEPARTMENT Provider Note   CSN: [id number] Arrival date & time: 11/14/18  1732    History   Chief Complaint Chief Complaint  Patient presents with   Weakness    HPI [name] is a 85 y.o. female.     HPI  Pt was seen at 1745. Per EMS and pt report: pt c/o gradual onset and persistence of constant generalized weakness over the past several weeks. States her symptoms started after she was discharged from SNF on 11/02/18. Pt states she has a productive cough, but "no worse than usual." Endorses hx of COPD and is on chronic 3L O2 N/C. Denies fevers, COVID+ exposure. Denies CP/palpitations, no SOB, no abd pain, no N/V/D, no focal motor weakness, no tingling/numbness in extremities.     Past Medical History:  Diagnosis Date   Anxiety    Arthritis    CHF (congestive heart failure) ([place])    Complication of anesthesia    " I have to have a spinal beacuse my lungs & heart are bad "   COPD (chronic obstructive pulmonary disease) ([place])    Coronary artery disease    Depression    Fibromyalgia    Hiatal hernia    Hyperlipidemia    Irregular heartbeat    On home oxygen therapy    "2L at night and prn" (03/29/2018)   Osteoporosis    Peripheral neuropathy 08/30/2017   Rotator cuff tear    RIGHT   UTI (urinary tract infection) 09/2014    Patient Active Problem List   Diagnosis Date Noted   Coronary artery disease of native artery of native heart with stable angina pectoris ([place]) 10/24/2018   Chronic pain of multiple joints 10/24/2018   Insomnia secondary to anxiety 10/24/2018   Chronic anxiety 10/24/2018   Malnutrition of moderate degree 10/13/2018   Cellulitis, leg 06/28/2018   Chronic diastolic CHF (congestive heart failure) ([place]) 06/28/2018   AF (paroxysmal atrial fibrillation) ([place]) 06/28/2018   Poor appetite 05/04/2018   Atrial fibrillation with RVR ([place]) 03/30/2018   Dysphagia 03/30/2018   Loss of weight 01/20/2018    Nausea and vomiting 01/20/2018   Peripheral neuropathy 08/30/2017   Peri-prosthetic fracture of femur following total hip arthroplasty 10/13/2014   Protein-calorie malnutrition, severe ([place]) 10/12/2014   COPD exacerbation ([place])    Acute cystitis without hematuria    Hypothyroidism    Syncope and collapse 07/02/2014   Osteoporosis 04/10/2013   Vaginal atrophy 04/10/2013    Past Surgical History:  Procedure Laterality Date   APPENDECTOMY     CATARACT EXTRACTION Left    DILATION AND CURETTAGE OF UTERUS     x2   ELBOW SURGERY     Right   HIP ARTHROPLASTY Right 09/22/2014   Procedure: ARTHROPLASTY BIPOLAR HIP;  Surgeon: [name], MD;  Location: [place];  Service: Orthopedics;  Laterality: Right;   TONSILLECTOMY     TOTAL HIP REVISION Right 10/13/2014   Procedure: REVISION RIGHT TOTAL HIP POSTERIOR ;  Surgeon: Rod Can, MD;  Location: [place];  Service: Orthopedics;  Laterality: Right;     OB History    Gravida  1   Para  1   Term      Preterm      AB      Living  1     SAB      TAB      Ectopic      Multiple      Live Births  Home Medications    Prior to Admission medications   Medication Sig Start Date End Date Taking? Authorizing Provider  acetaminophen (TYLENOL) 500 MG tablet Take 1,000 mg by mouth every 8 (eight) hours. 10/20/18   [provider]  albuterol (PROVENTIL HFA;VENTOLIN HFA) 108 (90 Base) MCG/ACT inhaler Inhale 2 puffs into the lungs every 6 (six) hours as needed for wheezing or shortness of breath. 10/20/18   [provider]  Janne Lab Oil [facility]) OINT Apply topically to sacrum and bilateral buttocks every shift and as needed for prevention 10/21/18   [provider]  diltiazem (CARDIZEM CD) 360 MG 24 hr capsule Take 1 capsule (360 mg total) by mouth daily. 04/06/18   Bonnielee Haff, MD  ELIQUIS 2.5 MG TABS tablet Take 2.5 mg by mouth 2 (two) times daily. 08/20/18    [provider]  feeding supplement, ENSURE ENLIVE, (ENSURE ENLIVE) LIQD Take 237 mLs by mouth 2 (two) times daily between meals. 10/20/18   Kathie Dike, MD  folic acid (FOLVITE) 272 MCG tablet Take 400 mcg by mouth daily.    [provider]  furosemide (LASIX) 20 MG tablet Take 40 mg by mouth daily. 10/20/18   [provider]  gabapentin (NEURONTIN) 100 MG capsule Take 200 mg by mouth at bedtime.     [provider]  guaiFENesin (MUCINEX) 600 MG 12 hr tablet Take 1 tablet (600 mg total) by mouth 2 (two) times daily. 10/20/18   Kathie Dike, MD  hydrocortisone (ANUSOL-HC) 25 MG suppository Place 25 mg rectally daily as needed for hemorrhoids.     [provider]  ipratropium-albuterol (DUONEB) 0.5-2.5 (3) MG/3ML SOLN Take 3 mLs by nebulization every 6 (six) hours. Dx:J44.9 11/07/18   Juanito Doom, MD  levothyroxine (SYNTHROID, LEVOTHROID) 75 MCG tablet Take 75 mcg by mouth daily.      [provider]  magnesium hydroxide (MILK OF MAGNESIA) 400 MG/5ML suspension Take 5 mLs by mouth daily as needed for mild constipation.     [provider]  Multiple Vitamins-Minerals (ICAPS AREDS 2) CAPS Take 1 tablet by mouth 2 (two) times daily.  10/20/18   [provider]  nitroGLYCERIN (NITROSTAT) 0.4 MG SL tablet Place 0.4 mg under the tongue every 5 (five) minutes as needed for chest pain.     [provider]  NON FORMULARY Diet type:  NAS    [provider]  ondansetron (ZOFRAN) 4 MG tablet Take 4 mg by mouth every 8 (eight) hours as needed for nausea or vomiting. 10/20/18   [provider]  OXYGEN Inhale 2 L into the lungs continuous.     [provider]  Polyethyl Glycol-Propyl Glycol (SYSTANE) 0.4-0.3 % SOLN Place 1 drop into both eyes at bedtime as needed (for dry eyes).     [provider]  PROCTOZONE-HC 2.5 % rectal cream Place 1 application rectally 2 (two) times daily.  06/20/18    [provider]  sodium chloride HYPERTONIC 3 % nebulizer solution Take 4 mLs by nebulization 2 (two) times daily.    [provider]  vitamin B-12 (CYANOCOBALAMIN) 1000 MCG tablet Take 1,000 mcg by mouth daily.    [provider]  Vitamins A & D (VITAMIN A & D) ointment Apply topically to bilateral feet and heels every shift and as needed for prevention 10/21/18   [provider]    Family History Family History  Problem Relation Age of Onset   Pancreatic cancer Father  Heart failure Mother    Hypertension Mother    Heart attack Brother    Hypertension Brother    Stroke Brother    Heart attack Sister    Diabetes Son    Obesity Son    Heart attack Son     Social History Social History   Tobacco Use   Smoking status: Never Smoker   Smokeless tobacco: Never Used  Substance Use Topics   Alcohol use: No   Drug use: No     Allergies   Shrimp [shellfish allergy]; Fish allergy; Adhesive [tape]; and Other   Review of Systems Review of Systems ROS: Statement: All systems negative except as marked or noted in the HPI; Constitutional: Negative for fever and chills. ; ; Eyes: Negative for eye pain, redness and discharge. ; ; ENMT: Negative for ear pain, hoarseness, nasal congestion, sinus pressure and sore throat. ; ; Cardiovascular: Negative for chest pain, palpitations, diaphoresis, dyspnea and peripheral edema. ; ; Respiratory: +cough. Negative for wheezing and stridor. ; ; Gastrointestinal: Negative for nausea, vomiting, diarrhea, abdominal pain, blood in stool, hematemesis, jaundice and rectal bleeding. . ; ; Genitourinary: Negative for dysuria, flank pain and hematuria. ; ; Musculoskeletal: Negative for back pain and neck pain. Negative for swelling and trauma.; ; Skin: Negative for pruritus, rash, abrasions, blisters, bruising and skin lesion.; ; Neuro: Negative for headache, lightheadedness and neck stiffness. Negative for  weakness, altered level of consciousness, altered mental status, extremity weakness, paresthesias, involuntary movement, seizure and syncope.       Physical Exam Updated Vital Signs BP 121/72 (BP Location: Left Arm)    Pulse 87    Temp 98.8 F (37.1 C) (Oral)    Resp 18    Ht 5\' 7"  (1.702 m)    Wt 50.1 kg    SpO2 99%    BMI 17.29 kg/m    Patient Vitals for the past 24 hrs:  BP Temp Temp src Pulse Resp SpO2 Height Weight  11/14/18 1930 (!) 154/76 -- -- 89 12 100 % -- --  11/14/18 1830 (!) 143/77 -- -- -- 13 -- -- --  11/14/18 1748 121/72 98.8 F (37.1 C) Oral 87 18 99 % 5\' 7"  (1.702 m) 50.1 kg     18:18 Orthostatic Vital Signs TH  Orthostatic Lying   BP- Lying: 127/62  Pulse- Lying: 89      Orthostatic Sitting  BP- Sitting: 141/108Abnormal   Pulse- Sitting: 98      Orthostatic Standing at 0 minutes  BP- Standing at 0 minutes: 122/72  Pulse- Standing at 0 minutes: 102     Physical Exam 1750: Physical examination:  Nursing notes reviewed; Vital signs and O2 SAT reviewed;  Constitutional: Well developed, Well nourished, Well hydrated, In no acute distress; Head:  Normocephalic, atraumatic; Eyes: EOMI, PERRL, No scleral icterus; ENMT: Mouth and pharynx normal, Mucous membranes moist; Neck: Supple, Full range of motion, No lymphadenopathy; Cardiovascular: Regular rate and rhythm, No gallop; Respiratory: Breath sounds clear & equal bilaterally, No wheezes.  Speaking full sentences with ease, Normal respiratory effort/excursion; Chest: Nontender, Movement normal; Abdomen: Soft, Nontender, Nondistended, Normal bowel sounds; Genitourinary: No CVA tenderness; Extremities: Peripheral pulses normal, No tenderness, +1-2 pedal edema bilat. No calf tenderness or asymmetry. +mild erythema L>R anterior lower legs.; Neuro: AA&Ox3, Major CN grossly intact. No facial droop. Speech clear. No gross focal motor or sensory deficits in extremities.; Skin: Color normal, Warm, Dry.     ED Treatments  / Results  Labs (all labs  ordered are listed, but only abnormal results are displayed)   EKG EKG Interpretation  Date/Time:  Monday November 14 2018 17:45:06 EDT Ventricular Rate:  96 PR Interval:    QRS Duration: 91 QT Interval:  351 QTC Calculation: 444 R Axis:   -57 Text Interpretation:  Atrial fibrillation LAD, consider left anterior fascicular block Probable anteroseptal infarct, old Artifact When compared with ECG of 10/13/2018, 10/11/2018 No significant change was found Confirmed by Francine Graven [phone]) on 11/14/2018 6:09:05 PM   Radiology   Procedures Procedures (including critical care time)  Medications Ordered in ED Medications - No data to display   Initial Impression / Assessment and Plan / ED Course  I have reviewed the triage vital signs and the nursing notes.  Pertinent labs & imaging results that were available during my care of the patient were reviewed by me and considered in my medical decision making (see chart for details).       MDM Reviewed: previous chart, nursing note and vitals Reviewed previous: labs and ECG Interpretation: labs, ECG and x-ray    Results for orders placed or performed during the hospital encounter of 11/14/18  Comprehensive metabolic panel  Result Value Ref Range   Sodium 136 135 - 145 mmol/L   Potassium 2.9 (L) 3.5 - 5.1 mmol/L   Chloride 85 (L) 98 - 111 mmol/L   CO2 41 (H) 22 - 32 mmol/L   Glucose, Bld 103 (H) 70 - 99 mg/dL   BUN 17 8 - 23 mg/dL   Creatinine, Ser 0.69 0.44 - 1.00 mg/dL   Calcium 8.2 (L) 8.9 - 10.3 mg/dL   Total Protein 7.3 6.5 - 8.1 g/dL   Albumin 3.7 3.5 - 5.0 g/dL   AST 31 15 - 41 U/L   ALT 27 0 - 44 U/L   Alkaline Phosphatase 101 38 - 126 U/L   Total Bilirubin 1.2 0.3 - 1.2 mg/dL   GFR calc non Af Amer >60 >60 mL/min   GFR calc Af Amer >60 >60 mL/min   Anion gap 10 5 - 15  Brain natriuretic peptide  Result Value Ref Range   B Natriuretic Peptide 578.0 (H) 0.0 - 100.0 pg/mL  Troponin I -  Once  Result Value Ref Range   Troponin I 0.03 (HH) <0.03 ng/mL  Lactic acid, plasma  Result Value Ref Range   Lactic Acid, Venous 1.2 0.5 - 1.9 mmol/L  CBC with Differential  Result Value Ref Range   WBC 7.6 4.0 - 10.5 K/uL   RBC 3.81 (L) 3.87 - 5.11 MIL/uL   Hemoglobin 11.8 (L) 12.0 - 15.0 g/dL   HCT 38.5 36.0 - 46.0 %   MCV 101.0 (H) 80.0 - 100.0 fL   MCH 31.0 26.0 - 34.0 pg   MCHC 30.6 30.0 - 36.0 g/dL   RDW 15.2 11.5 - 15.5 %   Platelets 222 150 - 400 K/uL   nRBC 0.0 0.0 - 0.2 %   Neutrophils Relative % 75 %   Neutro Abs 5.7 1.7 - 7.7 K/uL   Lymphocytes Relative 13 %   Lymphs Abs 1.0 0.7 - 4.0 K/uL   Monocytes Relative 12 %   Monocytes Absolute 0.9 0.1 - 1.0 K/uL   Eosinophils Relative 0 %   Eosinophils Absolute 0.0 0.0 - 0.5 K/uL   Basophils Relative 0 %   Basophils Absolute 0.0 0.0 - 0.1 K/uL   Immature Granulocytes 0 %   Abs Immature Granulocytes 0.02 0.00 - 0.07 K/uL  Protime-INR  Result Value Ref Range   Prothrombin Time 16.0 (H) 11.4 - 15.2 seconds   INR 1.3 (H) 0.8 - 1.2  Urinalysis, Routine w reflex microscopic  Result Value Ref Range   Color, Urine STRAW (A) YELLOW   APPearance CLEAR CLEAR   Specific Gravity, Urine 1.005 1.005 - 1.030   pH 7.0 5.0 - 8.0   Glucose, UA NEGATIVE NEGATIVE mg/dL   Hgb urine dipstick NEGATIVE NEGATIVE   Bilirubin Urine NEGATIVE NEGATIVE   Ketones, ur NEGATIVE NEGATIVE mg/dL   Protein, ur NEGATIVE NEGATIVE mg/dL   Nitrite NEGATIVE NEGATIVE   Leukocytes,Ua NEGATIVE NEGATIVE    Dg Chest Port 1 View Result Date: 11/14/2018 CLINICAL DATA:  85 y/o F; increasing weakness and productive cough as well as shortness of breath. EXAM: PORTABLE CHEST 1 VIEW COMPARISON:  10/15/2018 chest radiograph. FINDINGS: Stable cardiomegaly given projection and technique. Stable moderate left and small right pleural effusions. Diffuse hazy airspace opacities. Bibasilar consolidation. Bones are demineralized. No acute osseous abnormality is evident.  IMPRESSION: Stable moderate left and small right pleural effusions. Increased pulmonary edema. Bibasilar consolidation, likely associated atelectasis, however, pneumonia is possible. Electronically Signed   By: Kristine Garbe M.D.   On: 11/14/2018 19:33    Results for DELCENIA, INMAN (MRN [id number]) as of 11/14/2018 19:53  Ref. Range 10/11/2018 21:06 11/14/2018 18:05  B Natriuretic Peptide Latest Ref Range: 0.0 - 100.0 pg/mL 390.0 (H) 578.0 (H)   Results for PALESTINE, MOSCO (MRN [id number]) as of 11/14/2018 19:53  Ref. Range 10/11/2018 21:06 11/14/2018 18:05  Troponin I Latest Ref Range: <0.03 ng/mL <0.03 0.03 (HH)     1953:   H/H per baseline. BNP elevated with pulmonary edema on CXR; will dose IV lasix. Potassium repleted PO. Pt is not orthostatic on VS. T/C returned from [facility]. Myna Hidalgo, case discussed, including:  HPI, pertinent PM/SHx, VS/PE, dx testing, ED course and treatment:  Agreeable to admit.       Final Clinical Impressions(s) / ED Diagnoses   Final diagnoses:  None    ED Discharge Orders    None       Francine Graven, DO 11/18/18 1539

## 2018-11-14 NOTE — ED Triage Notes (Signed)
Pt brought in by EMS due to increasing weakness. Conts to have cough that is productive. Stays SOB per pt due to COPD. On continuous O2 @3 . Recently discharged from hospital

## 2018-11-14 NOTE — H&P (Signed)
History and Physical    [name] NKN:[id number] DOB: Dec 22, 1926 DOA: 11/14/2018  PCP: [name], MD   Patient coming from: Home   Chief Complaint: Gen weakness, fatigue  HPI: [name] is a 85 y.o. female with medical history significant for atrial fibrillation on Eliquis, chronic diastolic CHF, COPD with chronic hypoxic and hypercarbic respiratory failure, anxiety, and hypothyroidism, now presenting to emergency department for evaluation of progressive generalized weakness and fatigue.  Patient reports that she was recently discharged from SNF (on 11/02/2018) and has been experiencing some progressive fatigue and generalized weakness since that time.  She has also experienced insidious worsening in her chronic dyspnea.  She reports that the chronic cough is unchanged.  Denies any fevers or chills.  She denies any chest pain or palpitations.  She reports chronic bilateral lower extremity swelling but is uncertain if it is worse than usual at this time.  ED Course: Upon arrival to the ED, patient is found to be afebrile, saturating well on her usual 3 L/min of supplemental oxygen, and with remaining vitals normal.  EKG features atrial fibrillation with left axis deviation, similar to prior.  Chest x-ray notable for stable moderate left and small right pleural effusions with increased pulmonary edema and bibasilar consolidation that likely reflects atelectasis.  Chemistry panel is notable for potassium of 2.9 and bicarbonate of 41.  CBC notable for a mild chronic macrocytic anemia.  Lactic acid is reassuringly normal.  Urinalysis unremarkable.  Troponin slightly elevated at 0.03 and BNP is elevated at 578.  Patient was treated with 40 mEq oral potassium and 40 mg IV Lasix in the ED.  She will be observed for ongoing evaluation and management of acute on chronic diastolic CHF.   Review of Systems:  All other systems reviewed and apart from HPI, are negative.  Past Medical History:   Diagnosis Date  . Anxiety   . Arthritis   . CHF (congestive heart failure) ([place])   . Complication of anesthesia    " I have to have a spinal beacuse my lungs & heart are bad "  . COPD (chronic obstructive pulmonary disease) ([place])   . Coronary artery disease   . Depression   . Fibromyalgia   . Hiatal hernia   . Hyperlipidemia   . Irregular heartbeat   . On home oxygen therapy    "2L at night and prn" (03/29/2018)  . Osteoporosis   . Peripheral neuropathy 08/30/2017  . Rotator cuff tear    RIGHT  . UTI (urinary tract infection) 09/2014    Past Surgical History:  Procedure Laterality Date  . APPENDECTOMY    . CATARACT EXTRACTION Left   . DILATION AND CURETTAGE OF UTERUS     x2  . ELBOW SURGERY     Right  . HIP ARTHROPLASTY Right 09/22/2014   Procedure: ARTHROPLASTY BIPOLAR HIP;  Surgeon: [name], MD;  Location: [facility];  Service: Orthopedics;  Laterality: Right;  . TONSILLECTOMY    . TOTAL HIP REVISION Right 10/13/2014   Procedure: REVISION RIGHT TOTAL HIP POSTERIOR ;  Surgeon: Rod Can, MD;  Location: [facility];  Service: Orthopedics;  Laterality: Right;     reports that she has never smoked. She has never used smokeless tobacco. She reports that she does not drink alcohol or use drugs.  Allergies  Allergen Reactions  . Shrimp [Shellfish Allergy] Nausea And Vomiting, Swelling and Other (See Comments)    Made her run a fever also  .  Fish Allergy Nausea And Vomiting, Swelling and Other (See Comments)    Extremely sick  . Adhesive [Tape] Other (See Comments)    Tape BRUISES and TEARS THE SKIN; please use an alternative!!  . Other Other (See Comments)    Does not remember which "mycin" drug = yeast infection No seeds or nuts = Digestive isues    Family History  Problem Relation Age of Onset  . Pancreatic cancer Father   . Heart failure Mother   . Hypertension Mother   . Heart attack Brother   . Hypertension Brother   . Stroke Brother   . Heart attack Sister    . Diabetes Son   . Obesity Son   . Heart attack Son      Prior to Admission medications   Medication Sig Start Date End Date Taking? Authorizing Provider  clonazePAM (KLONOPIN) 0.5 MG tablet Take 0.5 mg by mouth 2 (two) times daily as needed. for anxiety 11/10/18  Yes [provider]  diltiazem (CARDIZEM CD) 360 MG 24 hr capsule Take 1 capsule (360 mg total) by mouth daily. 04/06/18  Yes Bonnielee Haff, MD  ELIQUIS 2.5 MG TABS tablet Take 2.5 mg by mouth 2 (two) times daily. 08/20/18  Yes [provider]  furosemide (LASIX) 20 MG tablet Take 40 mg by mouth daily. 10/20/18  Yes [provider]  gabapentin (NEURONTIN) 100 MG capsule Take 200 mg by mouth at bedtime.    Yes [provider]  ipratropium-albuterol (DUONEB) 0.5-2.5 (3) MG/3ML SOLN Take 3 mLs by nebulization every 6 (six) hours. Dx:J44.9 11/07/18  Yes Juanito Doom, MD  levothyroxine (SYNTHROID, LEVOTHROID) 75 MCG tablet Take 75 mcg by mouth daily.     Yes [provider]  acetaminophen (TYLENOL) 500 MG tablet Take 1,000 mg by mouth every 8 (eight) hours. 10/20/18   [provider]  albuterol (PROVENTIL HFA;VENTOLIN HFA) 108 (90 Base) MCG/ACT inhaler Inhale 2 puffs into the lungs every 6 (six) hours as needed for wheezing or shortness of breath. 10/20/18   [provider]  Janne Lab Oil [facility]) OINT Apply topically to sacrum and bilateral buttocks every shift and as needed for prevention 10/21/18   [provider]  feeding supplement, ENSURE ENLIVE, (ENSURE ENLIVE) LIQD Take 237 mLs by mouth 2 (two) times daily between meals. 10/20/18   Kathie Dike, MD  folic acid (FOLVITE) 086 MCG tablet Take 400 mcg by mouth daily.    [provider]  guaiFENesin (MUCINEX) 600 MG 12 hr tablet Take 1 tablet (600 mg total) by mouth 2 (two) times daily. 10/20/18   Kathie Dike, MD  hydrocortisone (ANUSOL-HC) 25 MG suppository Place 25 mg rectally daily as needed  for hemorrhoids.     [provider]  magnesium hydroxide (MILK OF MAGNESIA) 400 MG/5ML suspension Take 5 mLs by mouth daily as needed for mild constipation.     [provider]  Multiple Vitamins-Minerals (ICAPS AREDS 2) CAPS Take 1 tablet by mouth 2 (two) times daily.  10/20/18   [provider]  nitroGLYCERIN (NITROSTAT) 0.4 MG SL tablet Place 0.4 mg under the tongue every 5 (five) minutes as needed for chest pain.     [provider]  ondansetron (ZOFRAN) 4 MG tablet Take 4 mg by mouth every 8 (eight) hours as needed for nausea or vomiting. 10/20/18   [provider]  OXYGEN Inhale 2 L into the lungs continuous.     [provider]  Polyethyl Glycol-Propyl Glycol (SYSTANE)  0.4-0.3 % SOLN Place 1 drop into both eyes at bedtime as needed (for dry eyes).     [provider]  PROCTOZONE-HC 2.5 % rectal cream Place 1 application rectally 2 (two) times daily.  06/20/18   [provider]  sodium chloride HYPERTONIC 3 % nebulizer solution Take 4 mLs by nebulization 2 (two) times daily.    [provider]  vitamin B-12 (CYANOCOBALAMIN) 1000 MCG tablet Take 1,000 mcg by mouth daily.    [provider]  Vitamins A & D (VITAMIN A & D) ointment Apply topically to bilateral feet and heels every shift and as needed for prevention 10/21/18   [provider]    Physical Exam: Vitals:   11/14/18 1748 11/14/18 1830 11/14/18 1930 11/14/18 2000  BP: 121/72 (!) 143/77 (!) 154/76 122/66  Pulse: 87  89 93  Resp: 18 13 12 18   Temp: 98.8 F (37.1 C)     TempSrc: Oral     SpO2: 99%  100% 100%  Weight: 50.1 kg     Height: 5\' 7"  (1.702 m)       Constitutional: NAD, cachectic  Eyes: PERTLA, lids and conjunctivae normal ENMT: Mucous membranes are moist. Posterior pharynx clear of any exudate or lesions.   Neck: normal, supple, no masses, no thyromegaly Respiratory: Diminished bilaterally. Rales bilaterally. No  accessory muscle use.  Cardiovascular: Rate ~80 and irregular. Pretibial pitting edema bilaterally. Abdomen: No distension, no tenderness, soft. Bowel sounds normal.  Musculoskeletal: no clubbing / cyanosis. No joint deformity upper and lower extremities.    Skin: Faint erythema to distal LE's bilaterally. Warm, dry, well-perfused. Neurologic: No dysarthria or aphasia. Sensation intact. Moving all extremities.  Psychiatric:  Alert and oriented to person, place, and situation. Pleasant, cooperative.     Labs on Admission: I have personally reviewed following labs and imaging studies  CBC: Recent Labs  Lab 11/14/18 1805  WBC 7.6  NEUTROABS 5.7  HGB 11.8*  HCT 38.5  MCV 101.0*  PLT 662   Basic Metabolic Panel: Recent Labs  Lab 11/14/18 1805  NA 136  K 2.9*  CL 85*  CO2 41*  GLUCOSE 103*  BUN 17  CREATININE 0.69  CALCIUM 8.2*  MG 2.0   GFR: Estimated Creatinine Clearance: 37.7 mL/min (by C-G formula based on SCr of 0.69 mg/dL). Liver Function Tests: Recent Labs  Lab 11/14/18 1805  AST 31  ALT 27  ALKPHOS 101  BILITOT 1.2  PROT 7.3  ALBUMIN 3.7   No results for input(s): LIPASE, AMYLASE in the last 168 hours. No results for input(s): AMMONIA in the last 168 hours. Coagulation Profile: Recent Labs  Lab 11/14/18 1805  INR 1.3*   Cardiac Enzymes: Recent Labs  Lab 11/14/18 1805  TROPONINI 0.03*   BNP (last 3 results) Recent Labs    06/15/18 1429  PROBNP 2,504*   HbA1C: No results for input(s): HGBA1C in the last 72 hours. CBG: No results for input(s): GLUCAP in the last 168 hours. Lipid Profile: No results for input(s): CHOL, HDL, LDLCALC, TRIG, CHOLHDL, LDLDIRECT in the last 72 hours. Thyroid Function Tests: No results for input(s): TSH, T4TOTAL, FREET4, T3FREE, THYROIDAB in the last 72 hours. Anemia Panel: No results for input(s): VITAMINB12, FOLATE, FERRITIN, TIBC, IRON, RETICCTPCT in the last 72 hours. Urine analysis:    Component Value  Date/Time   COLORURINE STRAW (A) 11/14/2018 1805   APPEARANCEUR CLEAR 11/14/2018 1805   LABSPEC 1.005 11/14/2018 1805   PHURINE 7.0 11/14/2018 1805  GLUCOSEU NEGATIVE 11/14/2018 1805   HGBUR NEGATIVE 11/14/2018 1805   BILIRUBINUR NEGATIVE 11/14/2018 1805   KETONESUR NEGATIVE 11/14/2018 1805   PROTEINUR NEGATIVE 11/14/2018 1805   UROBILINOGEN 0.2 09/21/2014 1325   NITRITE NEGATIVE 11/14/2018 1805   LEUKOCYTESUR NEGATIVE 11/14/2018 1805   Sepsis Labs: @LABRCNTIP (procalcitonin:4,lacticidven:4) )No results found for this or any previous visit (from the past 240 hour(s)).   Radiological Exams on Admission: Dg Chest Port 1 View  Result Date: 11/14/2018 CLINICAL DATA:  85 y/o F; increasing weakness and productive cough as well as shortness of breath. EXAM: PORTABLE CHEST 1 VIEW COMPARISON:  10/15/2018 chest radiograph. FINDINGS: Stable cardiomegaly given projection and technique. Stable moderate left and small right pleural effusions. Diffuse hazy airspace opacities. Bibasilar consolidation. Bones are demineralized. No acute osseous abnormality is evident. IMPRESSION: Stable moderate left and small right pleural effusions. Increased pulmonary edema. Bibasilar consolidation, likely associated atelectasis, however, pneumonia is possible. Electronically Signed   By: Kristine Garbe M.D.   On: 11/14/2018 19:33    EKG: Independently reviewed. Atrial fibrillation, LAD, similar to prior.   Assessment/Plan   1. Acute on chronic diastolic CHF  - Presents with progressive gen weakness, fatigue, and SOB since SNF discharge 2 weeks ago  - History, ED workup, and exam consistent with acute on chronic CHF  - EF was preserved on TTE last month with mild LAE, moderate TR and MR, mild AI, and moderate pulm HTN  - She was given Lasix 40 mg IV in ED   - Continue diuresis with Lasix 40 mg IV q12h, follow daily wt and I/O's, monitor renal function and electrolytes    2. COPD; chronic hypoxic and  hypercarbic respiratory failure  - Patient denies increase in her chronic cough but has been increasingly SOB recently  - She requires 2-3 Lpm supplemental O2 at baseline  - Continue ipratropium-albuterol, supplemental O2    3. Atrial fibrillation  - In rate-controlled a fib on admission  - CHADS-VASc is at least 54 (age x2, gender, CAD)  - Continue Eliquis and diltiazem   4. Hypothyroidism  - Check TSH in light of gen weakness/fatigue  - Continue Synthroid    5. Hypokalemia  - Serum potassium is 2.9 in ED with mag 2.0  - Treated with 40 mEq oral and 20 mEq IV potassium  - Continue cardiac monitoring, follow daily chem panel while diuresing    6. Anxiety  - Stable, continue Klonopin as needed    7. CAD  - Troponin borderline at 0.03 in ED - No chest pain and no acute ischemic features on EKG  - Repeat troponin, continue cardiac monitoring for now     PPE: Mask, face shield  DVT prophylaxis: Eliquis   Code Status: DNR  Family Communication: Discussed with patient  Consults called: None Admission status: Observation     Vianne Bulls, MD [facility] Pager [phone]  If 7PM-7AM, please contact night-coverage [email or web address] Password [facility]  11/14/2018, 8:04 PM

## 2018-11-15 DIAGNOSIS — J811 Chronic pulmonary edema: Secondary | ICD-10-CM | POA: Diagnosis not present

## 2018-11-15 DIAGNOSIS — R0602 Shortness of breath: Secondary | ICD-10-CM | POA: Diagnosis not present

## 2018-11-15 DIAGNOSIS — E43 Unspecified severe protein-calorie malnutrition: Secondary | ICD-10-CM | POA: Diagnosis present

## 2018-11-15 DIAGNOSIS — J90 Pleural effusion, not elsewhere classified: Secondary | ICD-10-CM | POA: Diagnosis not present

## 2018-11-15 DIAGNOSIS — J9612 Chronic respiratory failure with hypercapnia: Secondary | ICD-10-CM | POA: Diagnosis present

## 2018-11-15 DIAGNOSIS — I272 Pulmonary hypertension, unspecified: Secondary | ICD-10-CM | POA: Diagnosis present

## 2018-11-15 DIAGNOSIS — R05 Cough: Secondary | ICD-10-CM | POA: Diagnosis not present

## 2018-11-15 DIAGNOSIS — I4891 Unspecified atrial fibrillation: Secondary | ICD-10-CM | POA: Diagnosis not present

## 2018-11-15 DIAGNOSIS — R69 Illness, unspecified: Secondary | ICD-10-CM | POA: Diagnosis not present

## 2018-11-15 DIAGNOSIS — E876 Hypokalemia: Secondary | ICD-10-CM | POA: Diagnosis present

## 2018-11-15 DIAGNOSIS — I5032 Chronic diastolic (congestive) heart failure: Secondary | ICD-10-CM | POA: Diagnosis not present

## 2018-11-15 DIAGNOSIS — G629 Polyneuropathy, unspecified: Secondary | ICD-10-CM | POA: Diagnosis present

## 2018-11-15 DIAGNOSIS — Z681 Body mass index (BMI) 19 or less, adult: Secondary | ICD-10-CM | POA: Diagnosis not present

## 2018-11-15 DIAGNOSIS — E038 Other specified hypothyroidism: Secondary | ICD-10-CM | POA: Diagnosis not present

## 2018-11-15 DIAGNOSIS — R5381 Other malaise: Secondary | ICD-10-CM | POA: Diagnosis not present

## 2018-11-15 DIAGNOSIS — Z743 Need for continuous supervision: Secondary | ICD-10-CM | POA: Diagnosis not present

## 2018-11-15 DIAGNOSIS — I5033 Acute on chronic diastolic (congestive) heart failure: Secondary | ICD-10-CM | POA: Diagnosis present

## 2018-11-15 DIAGNOSIS — I25118 Atherosclerotic heart disease of native coronary artery with other forms of angina pectoris: Secondary | ICD-10-CM | POA: Diagnosis present

## 2018-11-15 DIAGNOSIS — R0600 Dyspnea, unspecified: Secondary | ICD-10-CM | POA: Diagnosis not present

## 2018-11-15 DIAGNOSIS — F419 Anxiety disorder, unspecified: Secondary | ICD-10-CM | POA: Diagnosis not present

## 2018-11-15 DIAGNOSIS — Z66 Do not resuscitate: Secondary | ICD-10-CM | POA: Diagnosis present

## 2018-11-15 DIAGNOSIS — E785 Hyperlipidemia, unspecified: Secondary | ICD-10-CM | POA: Diagnosis present

## 2018-11-15 DIAGNOSIS — Z91048 Other nonmedicinal substance allergy status: Secondary | ICD-10-CM | POA: Diagnosis not present

## 2018-11-15 DIAGNOSIS — Z9981 Dependence on supplemental oxygen: Secondary | ICD-10-CM | POA: Diagnosis not present

## 2018-11-15 DIAGNOSIS — M797 Fibromyalgia: Secondary | ICD-10-CM | POA: Diagnosis present

## 2018-11-15 DIAGNOSIS — D539 Nutritional anemia, unspecified: Secondary | ICD-10-CM | POA: Diagnosis present

## 2018-11-15 DIAGNOSIS — Z7189 Other specified counseling: Secondary | ICD-10-CM | POA: Diagnosis not present

## 2018-11-15 DIAGNOSIS — I083 Combined rheumatic disorders of mitral, aortic and tricuspid valves: Secondary | ICD-10-CM | POA: Diagnosis present

## 2018-11-15 DIAGNOSIS — F411 Generalized anxiety disorder: Secondary | ICD-10-CM | POA: Diagnosis present

## 2018-11-15 DIAGNOSIS — R64 Cachexia: Secondary | ICD-10-CM | POA: Diagnosis present

## 2018-11-15 DIAGNOSIS — J9611 Chronic respiratory failure with hypoxia: Secondary | ICD-10-CM | POA: Diagnosis present

## 2018-11-15 DIAGNOSIS — Z515 Encounter for palliative care: Secondary | ICD-10-CM | POA: Diagnosis not present

## 2018-11-15 DIAGNOSIS — J449 Chronic obstructive pulmonary disease, unspecified: Secondary | ICD-10-CM | POA: Diagnosis present

## 2018-11-15 DIAGNOSIS — R110 Nausea: Secondary | ICD-10-CM | POA: Diagnosis not present

## 2018-11-15 DIAGNOSIS — J918 Pleural effusion in other conditions classified elsewhere: Secondary | ICD-10-CM | POA: Diagnosis present

## 2018-11-15 DIAGNOSIS — E039 Hypothyroidism, unspecified: Secondary | ICD-10-CM | POA: Diagnosis present

## 2018-11-15 DIAGNOSIS — R531 Weakness: Secondary | ICD-10-CM | POA: Diagnosis not present

## 2018-11-15 DIAGNOSIS — Z1159 Encounter for screening for other viral diseases: Secondary | ICD-10-CM | POA: Diagnosis not present

## 2018-11-15 DIAGNOSIS — I480 Paroxysmal atrial fibrillation: Secondary | ICD-10-CM | POA: Diagnosis present

## 2018-11-15 LAB — BASIC METABOLIC PANEL
Anion gap: 9 (ref 5–15)
BUN: 16 mg/dL (ref 8–23)
CO2: 45 mmol/L — ABNORMAL HIGH (ref 22–32)
Calcium: 8.3 mg/dL — ABNORMAL LOW (ref 8.9–10.3)
Chloride: 86 mmol/L — ABNORMAL LOW (ref 98–111)
Creatinine, Ser: 0.65 mg/dL (ref 0.44–1.00)
GFR calc Af Amer: >60 mL/min (ref >60)
GFR calc non Af Amer: >60 mL/min (ref >60)
Glucose, Bld: 102 mg/dL — ABNORMAL HIGH (ref 70–99)
Potassium: 3.6 mmol/L (ref 3.5–5.1)
Sodium: 140 mmol/L (ref 135–145)

## 2018-11-15 LAB — TSH: TSH: 9.958 u[IU]/mL — ABNORMAL HIGH (ref 0.350–4.500)

## 2018-11-15 LAB — TROPONIN I: Troponin I: 0.03 ng/mL (ref <0.03)

## 2018-11-15 MED ORDER — ADULT MULTIVITAMIN W/MINERALS CH
1.0000 | ORAL_TABLET | Freq: Every day | ORAL | Status: DC
  Administered 2018-11-15 – 2018-11-22 (×8): 1 via ORAL
  Filled 2018-11-15 (×8): qty 1

## 2018-11-15 MED ORDER — IPRATROPIUM-ALBUTEROL 0.5-2.5 (3) MG/3ML IN SOLN
3.0000 mL | Freq: Three times a day (TID) | RESPIRATORY_TRACT | Status: DC
  Administered 2018-11-15 – 2018-11-22 (×20): 3 mL via RESPIRATORY_TRACT
  Filled 2018-11-15 (×20): qty 3

## 2018-11-15 NOTE — Progress Notes (Signed)
Initial Nutrition Assessment  RD working remotely.    DOCUMENTATION CODES:   Severe malnutrition in context of chronic illness, Underweight  INTERVENTION:  Ensure Enlive po BID, each supplement provides 350 kcal and 20 grams of protein   Heart Healthy diet  Multivitamin daily due to suspect deficiencies of essential nutrients with poor intake  NUTRITION DIAGNOSIS:   Severe Malnutrition related to chronic illness(acute on chronic CHF, COPD and Fibromyalgia) as evidenced by estimated needs, energy intake < or equal to 75% for > or equal to 1 month, percent weight loss.   GOAL:   Patient will meet greater than or equal to 90% of their needs MONITOR:   PO intake, Supplement acceptance, Weight trends, Labs, I & O's  REASON FOR ASSESSMENT:   Malnutrition Screening Tool    ASSESSMENT: Patient is an 85 yo female with history of CAD, COPD (home O2 @ 2 L), Fibromyalgia, Arthritis, hiatal hernia and CHF. Hospitalized from 3-10 to 3-19 due to COPD exacerbation and d/c to rehab briefly until 3/31. Hx of lower extremity edema. Chronic diuretic therapy.   Talked with pt via telephone. She lives with husband - and says she has not been eating well the past month. She complains of nausea and lack of appetite. There is a person Tour manager) who has been coming in to help 5 days weekly. Dependency on assistance with food shopping and preparation as well as poor appetite places her at high risk for worsening nutrition status- compounded by her chronic disease burden and advanced age.  Hospital records show patient weight ranged between 50-55 kg the past 7 months. She is being diuresed currently and has desired wt loss. However expect unplanned wt loss as well due to lack of adequate nutrition intake due to persisting nausea and poor appetite. Weight is 94% of usual range. Malnutrition (chronic) due to COPD, CHF, Fibromyalgia. Energy intake </=75% for >/= month.    Medications reviewed and include:  Lasix (40 mg), Ensure Enlive, Folvite, B-12, Synthroid.   Labs: BMP Latest Ref Rng & Units 11/15/2018 11/14/2018 10/25/2018  Glucose 70 - 99 mg/dL 102(H) 103(H) 98  BUN 8 - 23 mg/dL 16 17 19   Creatinine 0.44 - 1.00 mg/dL 0.65 0.69 0.66  BUN/Creat Ratio 12 - 28 - - -  Sodium 135 - 145 mmol/L 140 136 139  Potassium 3.5 - 5.1 mmol/L 3.6 2.9(L) 4.0  Chloride 98 - 111 mmol/L 86(L) 85(L) 94(L)  CO2 22 - 32 mmol/L 45(H) 41(H) 35(H)  Calcium 8.9 - 10.3 mg/dL 8.3(L) 8.2(L) 8.4(L)     NUTRITION - FOCUSED PHYSICAL EXAM:  Unable to complete Nutrition-Focused physical exam at this time.    Diet Order:   Diet Order            Diet Heart Room service appropriate? Yes; Fluid consistency: Thin; Fluid restriction: 1500 mL Fluid  Diet effective now              EDUCATION NEEDS:   Education needs have been addressed Skin:  Skin Assessment: Reviewed RN Assessment(dry, flaky)  Last BM:  4/12  Height:   Ht Readings from Last 1 Encounters:  11/14/18 5\' 7"  (1.702 m)    Weight:   Wt Readings from Last 1 Encounters:  11/15/18 47.8 kg    Ideal Body Weight:  61 kg  BMI:  Body mass index is 16.5 kg/m.  Estimated Nutritional Needs:   Kcal:  7342-8768 (25-28 kcal/kg/bw)  Protein:  62-67gr (1.3-1.4 gr/kg/bw)  Fluid:  1200 ml daily (  25 ml/kg/bw)  Colman Cater MS,RD,CSG,LDN Office: [phone] Pager: [phone]

## 2018-11-15 NOTE — Plan of Care (Signed)
Patient is progressing with care plan. 

## 2018-11-15 NOTE — Progress Notes (Signed)
PROGRESS NOTE    [name]  TMH:[id number]  DOB: [birth year]  DOA: 11/14/2018 PCP: [name], MD   Brief Admission Hx: 85 year old female well-known to the medical service with history of hypothyroidism presented with progressive generalized weakness and fatigue.  She had recently been discharged from [facility] 11/02/18.    MDM/Assessment & Plan:   1. Acute on chronic diastolic congestive heart failure- the patient is improving with IV diuresis.  Continue IV Lasix as ordered, follow intake output, weights and renal function closely.  The patient had a TTE last month that revealed mild LAE, moderate TR and MR, mild AI, and moderate pulmonary hypertension. 2. COPD with longstanding chronic hypercarbic respiratory failure- continue supplemental oxygen as ordered continue ipratropium albuterol. 3. Chronic atrial fibrillation- the patient is anticoagulated fully with apixaban.  Her heart rate is well controlled with diltiazem.  Continue to monitor. 4. Hypothyroidism- she has been resumed on her home levothyroxine.  TSH is pending. 5. Hypokalemia- repleted, we will continue to follow especially in the setting of IV diuresis. 6. Generalized anxiety disorder-longstanding and controlled with clonazepam as needed. 7. Coronary artery disease- stable.  Continuous telemetry monitoring ordered.   DVT prophylaxis: Apixaban Code Status: DNR Family Communication: Patient updated, husband has advanced dementia Disposition Plan: Inpatient   Consultants:    Procedures:    Antimicrobials:     Subjective: Patient says she is feeling a lot better with treatments.  She has started to diurese.  She denies chest pain and palpitations.  Objective: Vitals:   11/14/18 2208 11/14/18 2246 11/15/18 0637 11/15/18 0741  BP: 130/77  139/78   Pulse: 98  98   Resp: 20  20   Temp: 97.9 F (36.6 C)  98.3 F (36.8 C)   TempSrc: Oral  Oral   SpO2: 95% 96% 95% 96%  Weight: 49.8 kg  47.8 kg    Height: 5\' 7"  (1.702 m)       Intake/Output Summary (Last 24 hours) at 11/15/2018 1052 Last data filed at 11/15/2018 0630 Gross per 24 hour  Intake 200 ml  Output 1200 ml  Net -1000 ml   Filed Weights   11/14/18 1748 11/14/18 2208 11/15/18 0637  Weight: 50.1 kg 49.8 kg 47.8 kg   REVIEW OF SYSTEMS  As per history otherwise all reviewed and reported negative  Exam:  General exam: Emaciated, chronically ill-appearing elderly female lying in the bed she is receiving a neb treatment no apparent distress Respiratory system: Bibasilar crackles, poor air movement. No increased work of breathing. Cardiovascular system: S1 & S2 heard. Gastrointestinal system: Abdomen is nondistended, soft and nontender. Normal bowel sounds heard. Central nervous system: Alert and oriented. No focal neurological deficits. Extremities: No pretibial edema.  Data Reviewed: Basic Metabolic Panel: Recent Labs  Lab 11/14/18 1805 11/15/18 0426  NA 136 140  K 2.9* 3.6  CL 85* 86*  CO2 41* 45*  GLUCOSE 103* 102*  BUN 17 16  CREATININE 0.69 0.65  CALCIUM 8.2* 8.3*  MG 2.0  --    Liver Function Tests: Recent Labs  Lab 11/14/18 1805  AST 31  ALT 27  ALKPHOS 101  BILITOT 1.2  PROT 7.3  ALBUMIN 3.7   No results for input(s): LIPASE, AMYLASE in the last 168 hours. No results for input(s): AMMONIA in the last 168 hours. CBC: Recent Labs  Lab 11/14/18 1805  WBC 7.6  NEUTROABS 5.7  HGB 11.8*  HCT 38.5  MCV 101.0*  PLT 222   Cardiac Enzymes: Recent  Labs  Lab 11/14/18 1805 11/14/18 2349  TROPONINI 0.03* 0.03*   CBG (last 3)  No results for input(s): GLUCAP in the last 72 hours. No results found for this or any previous visit (from the past 240 hour(s)).   Studies: Dg Chest Port 1 View  Result Date: 11/14/2018 CLINICAL DATA:  85 y/o F; increasing weakness and productive cough as well as shortness of breath. EXAM: PORTABLE CHEST 1 VIEW COMPARISON:  10/15/2018 chest radiograph. FINDINGS:  Stable cardiomegaly given projection and technique. Stable moderate left and small right pleural effusions. Diffuse hazy airspace opacities. Bibasilar consolidation. Bones are demineralized. No acute osseous abnormality is evident. IMPRESSION: Stable moderate left and small right pleural effusions. Increased pulmonary edema. Bibasilar consolidation, likely associated atelectasis, however, pneumonia is possible. Electronically Signed   By: Kristine Garbe M.D.   On: 11/14/2018 19:33     Scheduled Meds: . apixaban  2.5 mg Oral BID  . diltiazem  360 mg Oral Daily  . feeding supplement (ENSURE ENLIVE)  237 mL Oral BID BM  . folic acid  1 mg Oral Daily  . furosemide  40 mg Intravenous Q12H  . gabapentin  200 mg Oral QHS  . ipratropium-albuterol  3 mL Inhalation QID  . levothyroxine  75 mcg Oral Q0600  . sodium chloride flush  3 mL Intravenous Q12H  . vitamin B-12  1,000 mcg Oral Daily   Continuous Infusions: . sodium chloride      Principal Problem:   Acute on chronic diastolic CHF (congestive heart failure) (HCC) Active Problems:   COPD (chronic obstructive pulmonary disease) (HCC)   Hypothyroidism   AF (paroxysmal atrial fibrillation) (HCC)   Coronary artery disease of native artery of native heart with stable angina pectoris (HCC)   Chronic anxiety   Chronic respiratory failure with hypoxia and hypercapnia ([place])   Hypokalemia   Time spent:   Irwin Brakeman, MD [facility] 11/15/2018, 10:52 AM    LOS: 0 days  How to contact the [facility] Attending or Consulting provider [place] or covering provider during after hours [place], for this patient?  1. Check the care team in [facility] and look for a) attending/consulting [facility] provider listed and b) the [facility] team listed 2. Log into [email or web address] and use [place]'s universal password to access. If you do not have the password, please contact the hospital operator. 3. Locate the [facility] provider you are looking for under [facility] and page to a number that you can be directly reached. 4. If you still have difficulty reaching the provider, please page the [facility] (Director on Call) for the Hospitalists listed on amion for assistance.

## 2018-11-16 ENCOUNTER — Ambulatory Visit: Payer: Self-pay | Admitting: *Deleted

## 2018-11-16 ENCOUNTER — Inpatient Hospital Stay (HOSPITAL_COMMUNITY): Payer: Medicare Other

## 2018-11-16 DIAGNOSIS — Z515 Encounter for palliative care: Secondary | ICD-10-CM

## 2018-11-16 DIAGNOSIS — Z7189 Other specified counseling: Secondary | ICD-10-CM

## 2018-11-16 LAB — GLUCOSE, CAPILLARY
Glucose-Capillary: 121 mg/dL — ABNORMAL HIGH (ref 70–99)
Glucose-Capillary: 80 mg/dL (ref 70–99)

## 2018-11-16 LAB — BASIC METABOLIC PANEL
Anion gap: 10 (ref 5–15)
BUN: 22 mg/dL (ref 8–23)
CO2: 49 mmol/L — ABNORMAL HIGH (ref 22–32)
Calcium: 8.9 mg/dL (ref 8.9–10.3)
Chloride: 80 mmol/L — ABNORMAL LOW (ref 98–111)
Creatinine, Ser: 0.71 mg/dL (ref 0.44–1.00)
GFR calc Af Amer: >60 mL/min (ref >60)
GFR calc non Af Amer: >60 mL/min (ref >60)
Glucose, Bld: 121 mg/dL — ABNORMAL HIGH (ref 70–99)
Potassium: 3.8 mmol/L (ref 3.5–5.1)
Sodium: 139 mmol/L (ref 135–145)

## 2018-11-16 LAB — URINE CULTURE: Culture: <10000 — AB

## 2018-11-16 LAB — MAGNESIUM: Magnesium: 2.1 mg/dL (ref 1.7–2.4)

## 2018-11-16 MED ORDER — MAGNESIUM HYDROXIDE 400 MG/5ML PO SUSP
30.0000 mL | Freq: Every day | ORAL | Status: DC | PRN
  Administered 2018-11-16 – 2018-11-20 (×4): 30 mL via ORAL
  Filled 2018-11-16 (×5): qty 30

## 2018-11-16 NOTE — Plan of Care (Signed)
  Problem: Acute Rehab PT Goals(only PT should resolve) Goal: Pt Will Go Supine/Side To Sit Outcome: Progressing Flowsheets (Taken 11/16/2018 1221) Pt will go Supine/Side to Sit: with minimal assist Goal: Patient Will Transfer Sit To/From Stand Outcome: Progressing Flowsheets (Taken 11/16/2018 1221) Patient will transfer sit to/from stand: with minimal assist Goal: Pt Will Transfer Bed To Chair/Chair To Bed Outcome: Progressing Flowsheets (Taken 11/16/2018 1221) Pt will Transfer Bed to Chair/Chair to Bed: with min assist Goal: Pt Will Ambulate Outcome: Progressing Flowsheets (Taken 11/16/2018 1221) Pt will Ambulate: 25 feet; with minimal assist; with moderate assist; with rolling walker   12:22 PM, 11/16/18 Lonell Grandchild, MPT Physical Therapist with [facility] [phone] office [phone] mobile phone

## 2018-11-16 NOTE — Consult Note (Signed)
Consultation Note Date: 11/16/2018   Patient Name: [name]  DOB: 03-10-1927  MRN: [id number]  Age / Sex: 85 y.o., female  PCP: [name], MD Referring Physician: Kathie Dike, MD  Reason for Consultation: Establishing goals of care  HPI/Patient Profile: 85 y.o. female  with past medical history of diastolic CHF, COPD on home oxygen, CAD, HLD, afib on eliquis, depression, anxiety, fibromyalgia admitted on 11/14/2018 with generalized weakness and fatigue. Recently discharged from SNF on 11/02/18. Hospital admission for acute on chronic diastolic CHF receiving IV diuresis. TTE last month revealed mild LAE, moderate TR and MR, mild AI, and moderate pulmonary hypertension. Palliative medicine consultation for goals of care.    Clinical Assessment and Goals of Care:  I have reviewed medical records, discussed with care team, and met with patient at bedside to discuss diagnosis, GOC, EOL wishes, disposition and options. Attempted to speak with patient 2x today. She is minimally engaged in conversation with short responses.   Introduced Palliative Medicine as specialized medical care for people living with serious illness. It focuses on providing relief from the symptoms and stress of a serious illness. The goal is to improve quality of life for both the patient and the family.  Patient speaks of her admission to [facility] last month due to afib. She was discharged to SNF and returned home on 11/02/18. She speaks of chronic constipation as well as chronic back and right hip pain. She does not like taking any medications for pain besides tylenol due to her history of constipation. Offered tylenol this AM. Patient declined. Recently received milk of mag. She is on home oxygen, 2-3L.  Attempted to explore her support system. (Per chart review, husband with hx of dementia). Patient does not engage and tells me  she does not want support.   She is frustrated that she had to work with physical therapy this afternoon without warning. Explained that she may benefit from further rehab at [facility].   Attempted to discuss her understanding of current diagnoses and interventions. She shares that we are treating her for pneumonia. I reviewed CXR and medications with her and further explained she is hospitalized for acute on chronic CHF with underlying COPD. Attempted to explain that these are progressive, unfixable conditions that can only be managed with medications.     Advanced directives, concepts specific to code status, and artifical feeding and hydration were discussed. Patient speaks of completing a living will with husband as POA. Introduced and encouraged completion of MOST form. Patient acknowledges her desire for DNR, understanding this would "break ribs." She further explains she does not think she would want to be on a ventilator, if it came to that. She nods head no to feeding tube. Explained medical recommendation against heroic interventions with underlying frailty and chronic conditions. Patient is not interested in completing MOST form. Encouraged she further discuss these wishes with husband or other support people in order for family/friends to honor her EOL wishes if she were unable to verbalize them to care  team. MOST form placed in patient belonging bag.  I attempted to elicit values and goals of care important to the patient. She tells me she has "no goals" for herself. When asked what is most important for care team to focus on at this time, patient does not respond and dozes off to sleep.   PMT contact information given.    SUMMARY OF RECOMMENDATIONS    Patient would benefit from ongoing [place] discussions. Patient minimally engages in initial [place] discussion.   Patient does acknowledge desire for DNR and wishes against heroic interventions including life support/feeding tube. She is not  interested in completing MOST form. She shares her husband is her POA (per chart review, husband with hx of dementia?).  Continue current plan of care and medical interventions.   May need SNF again for short-term rehab.  Would benefit from outpatient palliative referral but unfortunately limited in [facility].   PMT will follow inpatient.  Code Status/Advance Care Planning:  DNR  Symptom Management:   Per attending  Palliative Prophylaxis:   Aspiration, Delirium Protocol, Frequent Pain Assessment and Oral Care   Psycho-social/Spiritual:   Desire for further Chaplaincy support:yes  Additional Recommendations: Caregiving  Support/Resources, Compassionate Wean Education and Education on Hospice  Prognosis:   Unable to determine  Discharge Planning: To Be Determined      Primary Diagnoses: Present on Admission: . Acute on chronic diastolic CHF (congestive heart failure) ([place]) . Hypothyroidism . Coronary artery disease of native artery of native heart with stable angina pectoris ([place]) . AF (paroxysmal atrial fibrillation) ([place]) . COPD (chronic obstructive pulmonary disease) ([place]) . Chronic respiratory failure with hypoxia and hypercapnia (H[facility]) . Hypokalemia . Chronic anxiety   I have reviewed the medical record, interviewed the patient and family, and examined the patient. The following aspects are pertinent.  Past Medical History:  Diagnosis Date  . Anxiety   . Arthritis   . CHF (congestive heart failure) ([place])   . Complication of anesthesia    " I have to have a spinal beacuse my lungs & heart are bad "  . COPD (chronic obstructive pulmonary disease) ([place])   . Coronary artery disease   . Depression   . Fibromyalgia   . Hiatal hernia   . Hyperlipidemia   . Irregular heartbeat   . On home oxygen therapy    "2L at night and prn" (03/29/2018)  . Osteoporosis   . Peripheral neuropathy 08/30/2017  . Rotator cuff tear    RIGHT  . UTI (urinary tract  infection) 09/2014   Social History   Socioeconomic History  . Marital status: Married    Spouse name: widowed  . Number of children: 1  . Years of education: 78  . Highest education level: Not on file  Occupational History  . Occupation: retired.  prev worked [employer]  . Financial resource strain: Not on file  . Food insecurity:    Worry: Not on file    Inability: Not on file  . Transportation needs:    Medical: Not on file    Non-medical: Not on file  Tobacco Use  . Smoking status: Never Smoker  . Smokeless tobacco: Never Used  Substance and Sexual Activity  . Alcohol use: No  . Drug use: No  . Sexual activity: Not on file  Lifestyle  . Physical activity:    Days per week: Not on file    Minutes per session: Not on file  . Stress: Not on  file  Relationships  . Social connections:    Talks on phone: Not on file    Gets together: Not on file    Attends religious service: Not on file    Active member of club or organization: Not on file    Attends meetings of clubs or organizations: Not on file    Relationship status: Not on file  Other Topics Concern  . Not on file  Social History Narrative   Lives with husand   Caffeine use: 1.5 cups coffee per day and pepsi   Right handed    Family History  Problem Relation Age of Onset  . Pancreatic cancer Father   . Heart failure Mother   . Hypertension Mother   . Heart attack Brother   . Hypertension Brother   . Stroke Brother   . Heart attack Sister   . Diabetes Son   . Obesity Son   . Heart attack Son    Scheduled Meds: . apixaban  2.5 mg Oral BID  . diltiazem  360 mg Oral Daily  . feeding supplement (ENSURE ENLIVE)  237 mL Oral BID BM  . folic acid  1 mg Oral Daily  . gabapentin  200 mg Oral QHS  . ipratropium-albuterol  3 mL Inhalation TID  . levothyroxine  75 mcg Oral Q0600  . multivitamin with minerals  1 tablet Oral Daily  . sodium chloride flush  3 mL Intravenous Q12H  . vitamin B-12   1,000 mcg Oral Daily   Continuous Infusions: . sodium chloride     PRN Meds:.sodium chloride, acetaminophen, albuterol, clonazePAM, guaiFENesin, magnesium hydroxide, ondansetron (ZOFRAN) IV, sodium chloride flush Medications Prior to Admission:  Prior to Admission medications   Medication Sig Start Date End Date Taking? Authorizing Provider  acetaminophen (TYLENOL) 500 MG tablet Take 1,000 mg by mouth every 8 (eight) hours. 10/20/18  Yes [provider]  albuterol (PROVENTIL HFA;VENTOLIN HFA) 108 (90 Base) MCG/ACT inhaler Inhale 2 puffs into the lungs every 6 (six) hours as needed for wheezing or shortness of breath. 10/20/18  Yes [provider]  clonazePAM (KLONOPIN) 0.5 MG tablet Take 0.5 mg by mouth 2 (two) times daily as needed. for anxiety 11/10/18  Yes [provider]  diltiazem (CARDIZEM CD) 360 MG 24 hr capsule Take 1 capsule (360 mg total) by mouth daily. 04/06/18  Yes Bonnielee Haff, MD  ELIQUIS 2.5 MG TABS tablet Take 2.5 mg by mouth 2 (two) times daily. 08/20/18  Yes [provider]  feeding supplement, ENSURE ENLIVE, (ENSURE ENLIVE) LIQD Take 237 mLs by mouth 2 (two) times daily between meals. 10/20/18  Yes Kathie Dike, MD  folic acid (FOLVITE) 010 MCG tablet Take 400 mcg by mouth daily.   Yes [provider]  furosemide (LASIX) 20 MG tablet Take 40 mg by mouth daily.  10/20/18  Yes [provider]  gabapentin (NEURONTIN) 100 MG capsule Take 300 mg by mouth at bedtime.    Yes [provider]  guaiFENesin (MUCINEX) 600 MG 12 hr tablet Take 1 tablet (600 mg total) by mouth 2 (two) times daily. 10/20/18  Yes Kathie Dike, MD  hydrocortisone (ANUSOL-HC) 25 MG suppository Place 25 mg rectally daily as needed for hemorrhoids.    Yes [provider]  ipratropium-albuterol (DUONEB) 0.5-2.5 (3) MG/3ML SOLN Take 3 mLs by nebulization every 6 (six) hours. Dx:J44.9 11/07/18  Yes Juanito Doom, MD  levothyroxine  (SYNTHROID, LEVOTHROID) 75 MCG tablet Take 75 mcg by mouth daily.  Yes [provider]  magnesium hydroxide (MILK OF MAGNESIA) 400 MG/5ML suspension Take 5 mLs by mouth daily as needed for mild constipation.    Yes [provider]  Multiple Vitamins-Minerals (ICAPS AREDS 2) CAPS Take 1 tablet by mouth 2 (two) times daily.  10/20/18  Yes [provider]  nitroGLYCERIN (NITROSTAT) 0.4 MG SL tablet Place 0.4 mg under the tongue every 5 (five) minutes as needed for chest pain.    Yes [provider]  ondansetron (ZOFRAN) 4 MG tablet Take 4 mg by mouth every 8 (eight) hours as needed for nausea or vomiting. 10/20/18  Yes [provider]  OXYGEN Inhale 2 L into the lungs continuous.    Yes [provider]  Polyethyl Glycol-Propyl Glycol (SYSTANE) 0.4-0.3 % SOLN Place 1 drop into both eyes at bedtime as needed (for dry eyes).    Yes [provider]  PROCTOZONE-HC 2.5 % rectal cream Place 1 application rectally 2 (two) times daily.  06/20/18  Yes [provider]  sodium chloride HYPERTONIC 3 % nebulizer solution Take 4 mLs by nebulization 2 (two) times daily.   Yes [provider]  vitamin B-12 (CYANOCOBALAMIN) 1000 MCG tablet Take 1,000 mcg by mouth daily.   Yes [provider]  Vitamins A & D (VITAMIN A & D) ointment Apply topically to bilateral feet and heels every shift and as needed for prevention 10/21/18  Yes [provider]   Allergies  Allergen Reactions  . Shrimp [Shellfish Allergy] Nausea And Vomiting, Swelling and Other (See Comments)    Made her run a fever also  . Fish Allergy Nausea And Vomiting, Swelling and Other (See Comments)    Extremely sick  . Adhesive [Tape] Other (See Comments)    Tape BRUISES and TEARS THE SKIN; please use an alternative!!  . Other Other (See Comments)    Does not remember which "mycin" drug = yeast infection No seeds or nuts = Digestive isues   Review of  Systems  Constitutional: Positive for activity change.  Respiratory: Positive for shortness of breath.   Gastrointestinal: Positive for constipation.  Neurological: Positive for weakness.   Physical Exam Vitals signs and nursing note reviewed.  Constitutional:      General: She is awake.  HENT:     Head: Normocephalic and atraumatic.  Pulmonary:     Effort: No tachypnea, accessory muscle usage or respiratory distress.  Skin:    General: Skin is warm and dry.     Coloration: Skin is pale.  Neurological:     Mental Status: She is alert and oriented to person, place, and time.  Psychiatric:        Attention and Perception: She is inattentive.        Speech: Speech normal.        Cognition and Memory: Cognition normal.     Comments: Irritable. Minimally engages in conversation.    Vital Signs: BP (!) 136/58 (BP Location: Right Wrist)   Pulse 77   Temp 98.2 F (36.8 C) (Oral)   Resp 16   Ht '5\' 7"'  (1.702 m)   Wt 49.9 kg   SpO2 98%   BMI 17.23 kg/m  Pain Scale: 0-10   Pain Score: 0-No pain   SpO2: SpO2: 98 % O2 Device:SpO2: 98 % O2 Flow Rate: .O2 Flow Rate (L/min): 2 L/min  IO: Intake/output summary:   Intake/Output Summary (Last 24 hours) at 11/16/2018 1351 Last data filed at 11/16/2018 0933 Gross per 24 hour  Intake 1280 ml  Output 1875 ml  Net -595 ml    LBM: Last BM Date: 11/13/18 Baseline Weight: Weight: 50.1 kg Most recent weight: Weight: 49.9 kg     Palliative Assessment/Data: PPS 50%   Flowsheet Rows     Most Recent Value  Intake Tab  Referral Department  Hospitalist  Unit at Time of Referral  Med/Surg Unit  Palliative Care Primary Diagnosis  Cardiac  Palliative Care Type  New Palliative care  Date first seen by Palliative Care  11/16/18  Clinical Assessment  Palliative Performance Scale Score  50%  Psychosocial & Spiritual Assessment  Palliative Care Outcomes  Patient/Family meeting held?  Yes  Who was at the meeting?  patient  Palliative  Care Outcomes  Clarified goals of care, Provided end of life care assistance, Provided psychosocial or spiritual support, ACP counseling assistance      Time In/Out: 1000-1020, 1310-1350 Time Total: 60 Greater than 50%  of this time was spent counseling and coordinating care related to the above assessment and plan.  Signed by:  Ihor Dow, FNP-C Palliative Medicine Team  Phone: [phone] Fax: [phone]   Please contact Palliative Medicine Team phone at [phone] for questions and concerns.  For individual provider: See Shea Evans

## 2018-11-16 NOTE — Evaluation (Signed)
Physical Therapy Evaluation Patient Details Name: [name] MRN: [id number] DOB: [birth year] Today's Date: 11/16/2018   History of Present Illness  [name] is a 85 y.o. female with medical history significant for atrial fibrillation on Eliquis, chronic diastolic CHF, COPD with chronic hypoxic and hypercarbic respiratory failure, anxiety, and hypothyroidism, now presenting to emergency department for evaluation of progressive generalized weakness and fatigue.  Patient reports that she was recently discharged from SNF (on 11/02/2018) and has been experiencing some progressive fatigue and generalized weakness since that time.  She has also experienced insidious worsening in her chronic dyspnea.  She reports that the chronic cough is unchanged.  Denies any fevers or chills.  She denies any chest pain or palpitations.  She reports chronic bilateral lower extremity swelling but is uncertain if it is worse than usual at this time.    Clinical Impression  Patient limited for functional mobility as stated below secondary to BLE weakness, fatigue and poor standing balance.  Patient limited to a few steps at bedside to transfer to [facility] and chair due to fall risk and tolerated staying up in chair after therapy after encouragement.   Patient will benefit from continued physical therapy in hospital and recommended venue below to increase strength, balance, endurance for safe ADLs and gait.    Follow Up Recommendations SNF    Equipment Recommendations  None recommended by PT    Recommendations for Other Services       Precautions / Restrictions Precautions Precautions: Fall Restrictions Weight Bearing Restrictions: No      Mobility  Bed Mobility Overal bed mobility: Needs Assistance Bed Mobility: Supine to Sit     Supine to sit: Mod assist     General bed mobility comments: had difficulty scooting to bedside and very sensitive to pressure to legs when given tactile  assistance  Transfers Overall transfer level: Needs assistance Equipment used: Rolling walker (2 wheeled) Transfers: Sit to/from [facility] Sit to Stand: Mod assist;Min assist Stand pivot transfers: Mod assist       General transfer comment: slow labored unsteady movement during transfer to [facility] and chair  Ambulation/Gait Ambulation/Gait assistance: Mod assist Gait Distance (Feet): 5 Feet Assistive device: Rolling walker (2 wheeled) Gait Pattern/deviations: Decreased step length - right;Decreased step length - left;Decreased stride length Gait velocity: decreased   General Gait Details: limited to 5-6 slow unsteady steps at bedside due to poor standing balance and c/o fatigue  Stairs            Wheelchair Mobility    Modified Rankin (Stroke Patients Only)       Balance Overall balance assessment: Needs assistance Sitting-balance support: Feet supported;No upper extremity supported Sitting balance-Leahy Scale: Fair     Standing balance support: Bilateral upper extremity supported;During functional activity Standing balance-Leahy Scale: Poor Standing balance comment: fair/poor using RW                             Pertinent Vitals/Pain Pain Assessment: Faces Faces Pain Scale: Hurts a little bit Pain Location: BLE with pressure Pain Descriptors / Indicators: Discomfort;Guarding Pain Intervention(s): Limited activity within patient's tolerance;Monitored during session    Home Living Family/patient expects to be discharged to:: Private residence Living Arrangements: Spouse/significant other Available Help at Discharge: Family Type of Home: House Home Access: Level entry     Home Layout: One level Home Equipment: Environmental consultant - 2 wheels;Walker - 4 wheels;Cane - single point;Wheelchair - [employer]  bed;Bedside commode      Prior Function Level of Independence: Independent with assistive device(s)         Comments: Household  ambulator with RW or rollator     Hand Dominance        Extremity/Trunk Assessment   Upper Extremity Assessment Upper Extremity Assessment: Generalized weakness    Lower Extremity Assessment Lower Extremity Assessment: Generalized weakness    Cervical / Trunk Assessment Cervical / Trunk Assessment: Normal  Communication   Communication: No difficulties  Cognition Arousal/Alertness: Awake/alert;Lethargic Behavior During Therapy: WFL for tasks assessed/performed Overall Cognitive Status: Within Functional Limits for tasks assessed                                 General Comments: slightly lethargic and drowsy      General Comments      Exercises     Assessment/Plan    PT Assessment Patient needs continued PT services  PT Problem List Decreased strength;Decreased activity tolerance;Decreased balance;Decreased mobility       PT Treatment Interventions Therapeutic exercise;Gait training;Stair training;Functional mobility training;Therapeutic activities;Patient/family education    PT Goals (Current goals can be found in the Care Plan section)  Acute Rehab PT Goals Patient Stated Goal: return home PT Goal Formulation: With patient Time For Goal Achievement: 11/30/18 Potential to Achieve Goals: Good    Frequency Min 3X/week   Barriers to discharge        Co-evaluation               AM-PAC PT "6 Clicks" Mobility  Outcome Measure Help needed turning from your back to your side while in a flat bed without using bedrails?: A Lot Help needed moving from lying on your back to sitting on the side of a flat bed without using bedrails?: A Lot Help needed moving to and from a bed to a chair (including a wheelchair)?: A Lot Help needed standing up from a chair using your arms (e.g., wheelchair or bedside chair)?: A Lot Help needed to walk in hospital room?: A Lot Help needed climbing 3-5 steps with a railing? : A Lot 6 Click Score: 12    End of  Session Equipment Utilized During Treatment: Oxygen Activity Tolerance: Patient tolerated treatment well;Patient limited by fatigue Patient left: in chair;with chair alarm set;with call bell/phone within reach Nurse Communication: Mobility status PT Visit Diagnosis: Unsteadiness on feet (R26.81);Other abnormalities of gait and mobility (R26.89);Muscle weakness (generalized) (M62.81)    Time: 5400-8676 PT Time Calculation (min) (ACUTE ONLY): 28 min   Charges:   PT Evaluation $PT Eval Moderate Complexity: 1 Mod PT Treatments $Therapeutic Activity: 23-37 mins        12:20 PM, 11/16/18 Lonell Grandchild, MPT Physical Therapist with [facility] [phone] office [phone] mobile phone

## 2018-11-16 NOTE — Progress Notes (Signed)
Denies shortness of breath.  Says no BM for 3 days and says MOM works well for her.  Dr. Roderic Palau ordered this.

## 2018-11-16 NOTE — Progress Notes (Signed)
Assisted back to bed after sitting in chair for several hours. Able to move self with walker and standby but said she felt weak.  Gave MOM for constipation. [place], call bell and bed alarm set.

## 2018-11-16 NOTE — Progress Notes (Signed)
PROGRESS NOTE    DANNY ZIMNY  SJG:[id number]  DOB: Jul 24, 1927  DOA: 11/14/2018 PCP: [name], MD   Brief Admission Hx: 85 year old female well-known to the medical service with history of hypothyroidism presented with progressive generalized weakness and fatigue.  She had recently been discharged from [facility] 11/02/18.    MDM/Assessment & Plan:   1. Acute on chronic diastolic congestive heart failure- the patient is being treated with intravenous diuretics.  Serum bicarb appears to be trending up indicating contraction alkalosis.  She still has evidence of pleural effusions on chest x-ray this morning.  Will hold further intravenous Lasix for now.  Will request possible thoracentesis.  The patient had a TTE last month that revealed mild LAE, moderate TR and MR, mild AI, and moderate pulmonary hypertension. 2. COPD with longstanding chronic hypercarbic respiratory failure- continue supplemental oxygen as ordered continue ipratropium albuterol. 3. Chronic atrial fibrillation- the patient is anticoagulated fully with apixaban.  Her heart rate is well controlled with diltiazem.  Continue to monitor. 4. Hypothyroidism- she has been resumed on her home levothyroxine.  TSH is elevated at 9.9.  Review of prior TSH levels indicate that it has been very labile ranging from 3-10.  Will need to confirm the patient has been taking her medications as prescribed. 5. Hypokalemia- repleted, we will continue to follow especially in the setting of IV diuresis. 6. Generalized anxiety disorder-longstanding and controlled with clonazepam as needed. 7. Coronary artery disease- stable.  Continuous telemetry monitoring ordered.   DVT prophylaxis: Apixaban Code Status: DNR Family Communication: Patient updated Disposition Plan: Inpatient   Consultants:  Palliative care   Procedures:    Antimicrobials:     Subjective: Feels that shortness of breath is unchanged from yesterday.  No chest pain.  Objective: Vitals:   11/16/18 0745 11/16/18 1414 11/16/18 1430 11/16/18 1913  BP:  (!) 141/63    Pulse:  71    Resp:  16    Temp:  98.4 F (36.9 C)    TempSrc:  Oral    SpO2: 98% 98% 99% 98%  Weight:      Height:        Intake/Output Summary (Last 24 hours) at 11/16/2018 1933 Last data filed at 11/16/2018 1900 Gross per 24 hour  Intake 720 ml  Output 1875 ml  Net -1155 ml   Filed Weights   11/14/18 2208 11/15/18 0637 11/16/18 0509  Weight: 49.8 kg 47.8 kg 49.9 kg   REVIEW OF SYSTEMS  As per history otherwise all reviewed and reported negative  Exam:  General exam: Emaciated, chronically ill-appearing elderly female lying in the bed she is receiving a neb treatment no apparent distress Respiratory system: Diminished breath sounds at bases. No increased work of breathing. Cardiovascular system: S1 & S2 heard. Gastrointestinal system: Abdomen is nondistended, soft and nontender. Normal bowel sounds heard. Central nervous system: Alert and oriented. No focal neurological deficits. Extremities: 1+ pretibial edema.  Data Reviewed: Basic Metabolic Panel: Recent Labs  Lab 11/14/18 1805 11/15/18 0426 11/16/18 0442  NA 136 140 139  K 2.9* 3.6 3.8  CL 85* 86* 80*  CO2 41* 45* 49*  GLUCOSE 103* 102* 121*  BUN 17 16 22   CREATININE 0.69 0.65 0.71  CALCIUM 8.2* 8.3* 8.9  MG 2.0  --  2.1   Liver Function Tests: Recent Labs  Lab 11/14/18 1805  AST 31  ALT 27  ALKPHOS 101  BILITOT 1.2  PROT 7.3  ALBUMIN 3.7   No results for input(s):  LIPASE, AMYLASE in the last 168 hours. No results for input(s): AMMONIA in the last 168 hours. CBC: Recent Labs  Lab 11/14/18 1805  WBC 7.6  NEUTROABS 5.7  HGB 11.8*  HCT 38.5  MCV 101.0*  PLT 222   Cardiac Enzymes: Recent Labs  Lab 11/14/18 1805 11/14/18 2349  TROPONINI 0.03* 0.03*   CBG (last 3)  Recent Labs    11/16/18 0726  GLUCAP 121*   Recent Results (from the past 240 hour(s))  Urine culture     Status:  Abnormal   Collection Time: 11/14/18  6:05 PM  Result Value Ref Range Status   Specimen Description   Final    URINE, RANDOM Performed at [facility], [address] [place], [place] [address]    Special Requests   Final    NONE Performed at [facility], [address] [place], [place] [address]    Culture (A)  Final    <10,000 COLONIES/mL INSIGNIFICANT GROWTH Performed at [facility], [place] [address] [place], [place] [address]    Report Status 11/16/2018 FINAL  Final     Studies: Dg Chest Port 1 View  Result Date: 11/16/2018 CLINICAL DATA:  Increased weakness.  Productive cough. EXAM: PORTABLE CHEST 1 VIEW COMPARISON:  November 14, 2018 FINDINGS: Bilateral pleural effusions, left greater than right, remain. Diffuse interstitial pulmonary opacities are similar, likely pulmonary edema. No other interval changes. IMPRESSION: 1. Bilateral pleural effusions, left greater than right, with underlying opacities are stable. The underlying opacities may simply represent compressive atelectasis. 2. Continued pulmonary edema. Electronically Signed   By: Dorise Bullion III M.D   On: 11/16/2018 08:37     Scheduled Meds: . apixaban  2.5 mg Oral BID  . diltiazem  360 mg Oral Daily  . feeding supplement (ENSURE ENLIVE)  237 mL Oral BID BM  . folic acid  1 mg Oral Daily  . gabapentin  200 mg Oral QHS  . ipratropium-albuterol  3 mL Inhalation TID  . levothyroxine  75 mcg Oral Q0600  . multivitamin with minerals  1 tablet Oral Daily  . sodium chloride flush  3 mL Intravenous Q12H  . vitamin B-12  1,000 mcg Oral Daily   Continuous Infusions: . sodium chloride      Principal Problem:   Acute on chronic diastolic CHF (congestive heart failure) (HCC) Active Problems:   COPD (chronic obstructive pulmonary disease) (HCC)   Hypothyroidism   AF (paroxysmal atrial fibrillation) (HCC)   Coronary artery disease of native artery of native heart with stable angina pectoris (HCC)   Chronic  anxiety   Chronic respiratory failure with hypoxia and hypercapnia (HCC)   Hypokalemia   Palliative care by specialist   Goals of care, counseling/discussion   Time spent: 31mins  Kathie Dike, MD [facility] 11/16/2018, 7:33 PM    LOS: 1 day

## 2018-11-16 NOTE — TOC Initial Note (Signed)
Transition of Care [facility]) - Initial/Assessment Note    Patient Details  Name: [name] MRN: [id number] Date of Birth: 1927-07-05  Transition of Care [facility]) CM/SW Contact:    Hardy Harcum, Chauncey [place], RN Phone Number: 11/16/2018, 2:49 PM  Clinical Narrative:       Patient admitted with CHF. From home with spouse. Walks with RW and has a quad cane. Has a neb machine. Has continuous oxygen with [facility] , baseline 2-3 liters.   Active with [facility]. Active with [facility] for nursing, PT, and OT.   She reports that she weighs daily and has no issues obtaining medications. Friends drive her to appointments. She pays OOP for help with cleaning, cooking, house chores for 5 days a week provided by friend Chelsea. Has other friends, Altamese Astatula and Aaron Edelman for support if needed.   Patient recommended for SNF again this visit. Recent stay at [facility]. Discussed with patient, she is reluctant to agree to SNF.  Is having conversations with palliative for [place].    She agrees to think over SNF recommendation and will talk with CM tomorrow (4/16).         Expected Discharge Plan: Skilled Nursing Facility Barriers to Discharge: No Barriers Identified   Patient Goals and CMS Choice Patient states their goals for this hospitalization and ongoing recovery are:: patient wants to go home, unsure if she will agree to SNF CMS Medicare.gov Compare Post Acute Care list provided to:: Patient Choice offered to / list presented to : Patient  Expected Discharge Plan and Services Expected Discharge Plan: [place]   Discharge Planning Services: CM Consult   Living arrangements for the past 2 months: [place] Agency: [place] ([place])  Prior Living Arrangements/Services Living arrangements for the past 2 months: Single Family Home Lives with:: Spouse, Other (Comment)(pays OOP for help with house chores) Patient  language and need for interpreter reviewed:: Yes Do you feel safe going back to the place where you live?: Yes      Need for Family Participation in Patient Care: Yes (Comment) Care giver support system in place?: Yes (comment) Current home services: Home PT, Home RN, Home OT Criminal Activity/Legal Involvement Pertinent to Current Situation/Hospitalization: No - Comment as needed  Activities of Daily Living Home Assistive Devices/Equipment: Walker (specify type) ADL Screening (condition at time of admission) Patient's cognitive ability adequate to safely complete daily activities?: Yes Is the patient deaf or have difficulty hearing?: No Does the patient have difficulty seeing, even when wearing glasses/contacts?: Yes Does the patient have difficulty concentrating, remembering, or making decisions?: No Patient able to express need for assistance with ADLs?: Yes Does the patient have difficulty dressing or bathing?: Yes Independently performs ADLs?: No Communication: Independent Dressing (OT): Needs assistance Is this a change from baseline?: Change from baseline, expected to last <3days Grooming: Needs assistance Is this a change from baseline?: Change from baseline, expected to last <3 days Feeding: Independent Bathing: Needs assistance Is this a change from baseline?: Change from baseline, expected to last <3 days Toileting: Needs assistance Is this a change from baseline?: Change from baseline, expected to last <3 days In/Out Bed: Needs assistance Is this a change from baseline?: Change from baseline, expected to last <3 days Walks in Home: Needs assistance Is this a change from baseline?: Change from baseline, expected  to last <3 days Does the patient have difficulty walking or climbing stairs?: Yes Weakness of Legs: Both Weakness of Arms/Hands: Both                Emotional Assessment Appearance:: Appears stated age Attitude/Demeanor/Rapport: Engaged Affect (typically  observed): Calm Orientation: : Oriented to Self, Oriented to Place, Oriented to  Time, Oriented to Situation Alcohol / Substance Use: Not Applicable Psych Involvement: No (comment)  Admission diagnosis:  Hypokalemia [E87.6] Generalized weakness [R53.1] Acute on chronic congestive heart failure, unspecified heart failure type [facility]) [I50.9] Patient Active Problem List   Diagnosis Date Noted  . Palliative care by specialist   . Goals of care, counseling/discussion   . Acute on chronic diastolic CHF (congestive heart failure) ([place]) 11/14/2018  . Chronic respiratory failure with hypoxia and hypercapnia ([place]) 11/14/2018  . Hypokalemia 11/14/2018  . Generalized weakness   . Coronary artery disease of native artery of native heart with stable angina pectoris ([place]) 10/24/2018  . Chronic pain of multiple joints 10/24/2018  . Insomnia secondary to anxiety 10/24/2018  . Chronic anxiety 10/24/2018  . Malnutrition of moderate degree 10/13/2018  . Cellulitis, leg 06/28/2018  . Chronic diastolic CHF (congestive heart failure) ([place]) 06/28/2018  . AF (paroxysmal atrial fibrillation) ([place]) 06/28/2018  . Poor appetite 05/04/2018  . Atrial fibrillation with RVR ([place]) 03/30/2018  . Dysphagia 03/30/2018  . Loss of weight 01/20/2018  . Nausea and vomiting 01/20/2018  . Peripheral neuropathy 08/30/2017  . Peri-prosthetic fracture of femur following total hip arthroplasty 10/13/2014  . Protein-calorie malnutrition, severe ([place]) 10/12/2014  . COPD (chronic obstructive pulmonary disease) ([place])   . Acute cystitis without hematuria   . Hypothyroidism   . Syncope and collapse 07/02/2014  . Osteoporosis 04/10/2013  . Vaginal atrophy 04/10/2013   PCP:  [name], MD Pharmacy:   [facility] [address] [place] [address] [place] [address] Phone: [phone] Fax: [phone]     Social Determinants of Health (SDOH) Interventions    Readmission Risk  Interventions Readmission Risk Prevention Plan 10/20/2018 10/12/2018  Transportation Screening Complete Complete  PCP or Specialist Appt within 3-5 Days Complete -  Home Care Screening - Complete  Medication Review (RN CM) - Complete  HRI or Home Care Consult Complete -  Social Work Consult for [place] Planning/Counseling Complete -  Palliative Care Screening Not Applicable -  Medication Review Press photographer) Complete -  Some recent data might be hidden

## 2018-11-17 ENCOUNTER — Inpatient Hospital Stay (HOSPITAL_COMMUNITY): Payer: Medicare Other

## 2018-11-17 ENCOUNTER — Encounter (HOSPITAL_COMMUNITY): Payer: Self-pay

## 2018-11-17 LAB — BASIC METABOLIC PANEL
Anion gap: 9 (ref 5–15)
BUN: 24 mg/dL — ABNORMAL HIGH (ref 8–23)
CO2: 50 mmol/L — ABNORMAL HIGH (ref 22–32)
Calcium: 8.8 mg/dL — ABNORMAL LOW (ref 8.9–10.3)
Chloride: 78 mmol/L — ABNORMAL LOW (ref 98–111)
Creatinine, Ser: 0.7 mg/dL (ref 0.44–1.00)
GFR calc Af Amer: >60 mL/min (ref >60)
GFR calc non Af Amer: >60 mL/min (ref >60)
Glucose, Bld: 109 mg/dL — ABNORMAL HIGH (ref 70–99)
Potassium: 3.7 mmol/L (ref 3.5–5.1)
Sodium: 137 mmol/L (ref 135–145)

## 2018-11-17 LAB — BODY FLUID CELL COUNT WITH DIFFERENTIAL
Eos, Fluid: 0 %
Lymphs, Fluid: 28 %
Monocyte-Macrophage-Serous Fluid: 0 % — ABNORMAL LOW (ref 50–90)
Neutrophil Count, Fluid: 72 % — ABNORMAL HIGH (ref 0–25)
Other Cells, Fluid: 1 %
Total Nucleated Cell Count, Fluid: 1517 cu mm — ABNORMAL HIGH (ref 0–1000)

## 2018-11-17 LAB — CBC
HCT: 36.2 % (ref 36.0–46.0)
Hemoglobin: 11.1 g/dL — ABNORMAL LOW (ref 12.0–15.0)
MCH: 31.3 pg (ref 26.0–34.0)
MCHC: 30.7 g/dL (ref 30.0–36.0)
MCV: 102 fL — ABNORMAL HIGH (ref 80.0–100.0)
Platelets: 184 10*3/uL (ref 150–400)
RBC: 3.55 MIL/uL — ABNORMAL LOW (ref 3.87–5.11)
RDW: 15.2 % (ref 11.5–15.5)
WBC: 7.5 10*3/uL (ref 4.0–10.5)
nRBC: 0 % (ref 0.0–0.2)

## 2018-11-17 LAB — ALBUMIN, PLEURAL OR PERITONEAL FLUID: Albumin, Fluid: 2.2 g/dL

## 2018-11-17 LAB — GRAM STAIN

## 2018-11-17 LAB — PROTEIN, PLEURAL OR PERITONEAL FLUID: Total protein, fluid: 3.4 g/dL

## 2018-11-17 NOTE — Progress Notes (Signed)
Returned earlier from thoracentesis.  Dressing to left posterior ribs dry and intact.

## 2018-11-17 NOTE — Procedures (Signed)
Interventional Radiology Procedure Note  Procedure: US guided left thoracentesis.   ~330cc of serosanguinous fluid.  Labs sent  Complications: None Recommendations:  - Sterile dressing.  Routine dressing changes starting in 24 hours - CXR now - Do not submerge for 7 days - Routine care   Signed,  Dulcy Fanny. Earleen Newport, DO

## 2018-11-17 NOTE — Progress Notes (Signed)
Physical Therapy Treatment Patient Details Name: [name] MRN: [id number] DOB: 11-11-1926 Today's Date: 11/17/2018    History of Present Illness [name] is a 85 y.o. female with medical history significant for atrial fibrillation on Eliquis, chronic diastolic CHF, COPD with chronic hypoxic and hypercarbic respiratory failure, anxiety, and hypothyroidism, now presenting to emergency department for evaluation of progressive generalized weakness and fatigue.  Patient reports that she was recently discharged from SNF (on 11/02/2018) and has been experiencing some progressive fatigue and generalized weakness since that time.  She has also experienced insidious worsening in her chronic dyspnea.  She reports that the chronic cough is unchanged.  Denies any fevers or chills.  She denies any chest pain or palpitations.  She reports chronic bilateral lower extremity swelling but is uncertain if it is worse than usual at this time.    PT Comments    Pt supine in bed and willing to participate with therapy today.  Pt required mod A with bed mobility, cueing for hand placement assist with sitting, mod A scooting to EOB.  SPT complete to [facility], pt able to make 1 small bowel movement, RN aware.  Pt limited by weakness and fatigue with ability to ambulate 5-6 feet with RW and min A for safety.  Seated exercises complete in alternating movements for strengthening and alternating movements to assist with gait.  Pt left in chair with chair alarm set, call bell within reach and RN aware of status.  No reports of pain through session, did c/o her "jerking" during transfers.   Follow Up Recommendations        Equipment Recommendations       Recommendations for Other Services       Precautions / Restrictions Precautions Precautions: Fall    Mobility  Bed Mobility Overal bed mobility: Needs Assistance Bed Mobility: Supine to Sit     Supine to sit: Min assist     General bed mobility  comments: had difficulty scooting to bedside and very sensitive to pressure to legs when given tactile assistance; cueing for handplacement to assist  Transfers Overall transfer level: Needs assistance Equipment used: Rolling walker (2 wheeled) Transfers: Sit to/from [facility] Sit to Stand: Min assist;Mod assist Stand pivot transfers: Min assist       General transfer comment: SPT to [facility].  Slow labored unsteady movements wiht occassial jerking during transfer to [facility] and chair.    Ambulation/Gait Ambulation/Gait assistance: Mod assist Gait Distance (Feet): 5 Feet Assistive device: Rolling walker (2 wheeled) Gait Pattern/deviations: Decreased step length - right;Decreased step length - left;Decreased stride length Gait velocity: decreased   General Gait Details: limited to 5-6 slow unsteady steps at bedside due to poor standing balance and c/o fatigue   Stairs             Wheelchair Mobility    Modified Rankin (Stroke Patients Only)       Balance                                            Cognition Arousal/Alertness: Awake/alert Behavior During Therapy: WFL for tasks assessed/performed Overall Cognitive Status: Within Functional Limits for tasks assessed  Exercises      General Comments        Pertinent Vitals/Pain Pain Assessment: No/denies pain    Home Living                      Prior Function            PT Goals (current goals can now be found in the care plan section)      Frequency           PT Plan      Co-evaluation              AM-PAC PT "6 Clicks" Mobility   Outcome Measure                   End of Session               Time:  -     Charges:                        [name]; [place]  [name] 11/17/2018, 5:49 PM

## 2018-11-17 NOTE — Progress Notes (Signed)
PROGRESS NOTE    [name]  FWY:[id number]  DOB: [birth year]  DOA: 11/14/2018 PCP: [name], MD   Brief Admission Hx: 85 year old female well-known to the medical service with history of hypothyroidism presented with progressive generalized weakness and fatigue.  She had recently been discharged from [facility] 11/02/18.    MDM/Assessment & Plan:   1. Acute on chronic diastolic congestive heart failure- the patient is being treated with intravenous diuretics.  Serum bicarb appears to be trending up indicating contraction alkalosis.  Further diuretics were held.  She underwent thoracentesis today of left pleural effusion with removal of 330 cc of fluid.  The patient had a TTE last month that revealed mild LAE, moderate TR and MR, mild AI, and moderate pulmonary hypertension. 2. COPD with longstanding chronic hypercarbic respiratory failure- continue supplemental oxygen as ordered continue ipratropium albuterol.  Patient has been unwilling to try BiPAP therapy in the past.  I suspect this is contributing to her elevated serum bicarbonate. 3. Chronic atrial fibrillation- the patient is anticoagulated fully with apixaban.  Her heart rate is well controlled with diltiazem.  Continue to monitor. 4. Hypothyroidism- she has been resumed on her home levothyroxine.  TSH is elevated at 9.9.  Review of prior TSH levels indicate that it has been very labile ranging from 3-10.  Will need to confirm the patient has been taking her medications as prescribed. 5. Hypokalemia- repleted, we will continue to follow especially in the setting of IV diuresis. 6. Generalized anxiety disorder-longstanding and controlled with clonazepam as needed. 7. Coronary artery disease- stable.  Continuous telemetry monitoring ordered.  8. Goals of care.  Palliative care following.  I had an honest discussion with the patient today regarding her current health status and expected prognosis.  With her multiple medical problems,  repeated admissions, overall frailty, I have recommended hospice services.  Patient was initially somewhat resistant to discuss hospice, but may be agreeable if hospice services can be provided in the home.  I attempted to call her husband to discuss this further but was unable to reach him.  At her request, I spoke to her caregiver, Vikki Ports regarding her prognosis and recommendations for hospice.  Chelsea agrees that she is noticed an overall decline in the patient's condition.  She is the patient and husband's caregiver during the day, but they do not have any care in the home overnight.  She will discussed with the patient's husband regarding hiring someone to be with him overnight.  If this can be performed, home with hospice may be the best option for the patient.   DVT prophylaxis: Apixaban Code Status: DNR Family Communication: Patient updated Disposition Plan: Inpatient   Consultants:  Palliative care   Procedures:  Thoracentesis of left pleural effusion with removal of 330cc fluid  Antimicrobials:     Subjective: Patient seen in her room after thoracentesis.  She says she feels about the same.  No worsening shortness of breath.  Objective: Vitals:   11/17/18 0724 11/17/18 1200 11/17/18 1408 11/17/18 1926  BP:  127/73 111/76   Pulse:  63 88   Resp:  20 15   Temp:   98 F (36.7 C)   TempSrc:      SpO2: 97% 100% 100% 98%  Weight:      Height:        Intake/Output Summary (Last 24 hours) at 11/17/2018 1950 Last data filed at 11/17/2018 1700 Gross per 24 hour  Intake 840 ml  Output 1050 ml  Net -  210 ml   Filed Weights   11/15/18 8101 11/16/18 0509 11/17/18 0538  Weight: 47.8 kg 49.9 kg 48.6 kg   REVIEW OF SYSTEMS  As per history otherwise all reviewed and reported negative  Exam:  General exam: Alert, awake, oriented x 3 Respiratory system: Diminished breath sounds at the bases. Respiratory effort normal. Cardiovascular system:RRR. No murmurs, rubs, gallops.  Gastrointestinal system: Abdomen is nondistended, soft and nontender. No organomegaly or masses felt. Normal bowel sounds heard. Central nervous system: Alert and oriented. No focal neurological deficits. Extremities: Trace pedal edema bilaterally Skin: No rashes, lesions or ulcers Psychiatry: Judgement and insight appear normal. Mood & affect appropriate.    Data Reviewed: Basic Metabolic Panel: Recent Labs  Lab 11/14/18 1805 11/15/18 0426 11/16/18 0442 11/17/18 0454  NA 136 140 139 137  K 2.9* 3.6 3.8 3.7  CL 85* 86* 80* 78*  CO2 41* 45* 49* 50*  GLUCOSE 103* 102* 121* 109*  BUN 17 16 22  24*  CREATININE 0.69 0.65 0.71 0.70  CALCIUM 8.2* 8.3* 8.9 8.8*  MG 2.0  --  2.1  --    Liver Function Tests: Recent Labs  Lab 11/14/18 1805  AST 31  ALT 27  ALKPHOS 101  BILITOT 1.2  PROT 7.3  ALBUMIN 3.7   No results for input(s): LIPASE, AMYLASE in the last 168 hours. No results for input(s): AMMONIA in the last 168 hours. CBC: Recent Labs  Lab 11/14/18 1805 11/17/18 0454  WBC 7.6 7.5  NEUTROABS 5.7  --   HGB 11.8* 11.1*  HCT 38.5 36.2  MCV 101.0* 102.0*  PLT 222 184   Cardiac Enzymes: Recent Labs  Lab 11/14/18 1805 11/14/18 2349  TROPONINI 0.03* 0.03*   CBG (last 3)  Recent Labs    11/16/18 0726 11/16/18 2149  GLUCAP 121* 80   Recent Results (from the past 240 hour(s))  Urine culture     Status: Abnormal   Collection Time: 11/14/18  6:05 PM  Result Value Ref Range Status   Specimen Description   Final    URINE, RANDOM Performed at [facility], [address] [place], [place] [address]    Special Requests   Final    NONE Performed at [facility], [address] [place], [place] [address]    Culture (A)  Final    <10,000 COLONIES/mL INSIGNIFICANT GROWTH Performed at [facility], [place] [address] [place], [place] [address]    Report Status 11/16/2018 FINAL  Final  Gram stain     Status: None   Collection Time: 11/17/18 12:15 PM  Result  Value Ref Range Status   Specimen Description PLEURAL  Final   Special Requests NONE  Final   Gram Stain   Final    CYTOSPIN SMEAR WBC PRESENT, PREDOMINANTLY PMN FEW MONONUCLEAR WBC PRESENT NO ORGANISMS SEEN Performed at [facility], [address] [place],  [address]    Report Status 11/17/2018 FINAL  Final     Studies: Dg Chest 1 View  Result Date: 11/17/2018 CLINICAL DATA:  85 year old female with a history of left thoracentesis EXAM: CHEST  1 VIEW COMPARISON:  11/16/2018 FINDINGS: Cardiomediastinal silhouette unchanged in size and contour. Improved opacity at the left lung base with blunting of left costophrenic angle. No pneumothorax. Opacity at the right lung base persists with blunting of the right costophrenic angle and cardiophrenic angle. Reticular opacities of the lungs persist, with improving aeration. IMPRESSION: Improved left pleural effusion, with no pneumothorax. Persisting opacity at the  right lung base potentially combination of pleural fluid, atelectasis/consolidation. Decreasing interstitial edema. Electronically Signed   By: Corrie Mckusick D.O.   On: 11/17/2018 12:40   Dg Chest Port 1 View  Result Date: 11/16/2018 CLINICAL DATA:  Increased weakness.  Productive cough. EXAM: PORTABLE CHEST 1 VIEW COMPARISON:  November 14, 2018 FINDINGS: Bilateral pleural effusions, left greater than right, remain. Diffuse interstitial pulmonary opacities are similar, likely pulmonary edema. No other interval changes. IMPRESSION: 1. Bilateral pleural effusions, left greater than right, with underlying opacities are stable. The underlying opacities may simply represent compressive atelectasis. 2. Continued pulmonary edema. Electronically Signed   By: Dorise Bullion III M.D   On: 11/16/2018 08:37   US Thoracentesis Asp Pleural Space W/img Guide  Result Date: 11/17/2018 INDICATION: 85 year old female with a history of pleural effusion. EXAM: ULTRASOUND GUIDED LEFT THORACENTESIS  MEDICATIONS: None. COMPLICATIONS: None PROCEDURE: An ultrasound guided thoracentesis was thoroughly discussed with the patient and questions answered. The benefits, risks, alternatives and complications were also discussed. The patient understands and wishes to proceed with the procedure. Written consent was obtained. Ultrasound was performed to localize and mark an adequate pocket of fluid in the left chest. The area was then prepped and draped in the normal sterile fashion. 1% Lidocaine was used for local anesthesia. Under ultrasound guidance a 19 gauge, 7-cm, Yueh catheter was introduced. Thoracentesis was performed. The catheter was removed and a dressing applied. FINDINGS: A total of approximately 330 cc of serosanguineous fluid was removed. Samples were sent to the laboratory as requested by the clinical team. IMPRESSION: Status post ultrasound-guided left-sided thoracentesis. Signed, Dulcy Fanny. Dellia Nims, RPVI [facility] Radiology Electronically Signed   By: Corrie Mckusick D.O.   On: 11/17/2018 13:09     Scheduled Meds: . apixaban  2.5 mg Oral BID  . diltiazem  360 mg Oral Daily  . feeding supplement (ENSURE ENLIVE)  237 mL Oral BID BM  . folic acid  1 mg Oral Daily  . gabapentin  200 mg Oral QHS  . ipratropium-albuterol  3 mL Inhalation TID  . levothyroxine  75 mcg Oral Q0600  . multivitamin with minerals  1 tablet Oral Daily  . sodium chloride flush  3 mL Intravenous Q12H  . vitamin B-12  1,000 mcg Oral Daily   Continuous Infusions: . sodium chloride      Principal Problem:   Acute on chronic diastolic CHF (congestive heart failure) (HCC) Active Problems:   COPD (chronic obstructive pulmonary disease) (HCC)   Hypothyroidism   AF (paroxysmal atrial fibrillation) (HCC)   Coronary artery disease of native artery of native heart with stable angina pectoris (HCC)   Chronic anxiety   Chronic respiratory failure with hypoxia and  hypercapnia (HCC)   Hypokalemia   Palliative care by specialist   Goals of care, counseling/discussion   Time spent: 65mins  Kathie Dike, MD [facility] 11/17/2018, 7:50 PM    LOS: 2 days

## 2018-11-17 NOTE — Progress Notes (Signed)
Walked with assist to bedside commode and had small bm.   MOM with prune juice given.  Was just transported for thoracentesis.

## 2018-11-18 LAB — BASIC METABOLIC PANEL
Anion gap: 9 (ref 5–15)
BUN: 19 mg/dL (ref 8–23)
CO2: 45 mmol/L — ABNORMAL HIGH (ref 22–32)
Calcium: 8.3 mg/dL — ABNORMAL LOW (ref 8.9–10.3)
Chloride: 83 mmol/L — ABNORMAL LOW (ref 98–111)
Creatinine, Ser: 0.79 mg/dL (ref 0.44–1.00)
GFR calc Af Amer: >60 mL/min (ref >60)
GFR calc non Af Amer: >60 mL/min (ref >60)
Glucose, Bld: 88 mg/dL (ref 70–99)
Potassium: 3.7 mmol/L (ref 3.5–5.1)
Sodium: 137 mmol/L (ref 135–145)

## 2018-11-18 NOTE — Progress Notes (Signed)
New gauze dressing applied to left thoracentesis site.

## 2018-11-18 NOTE — Progress Notes (Signed)
PROGRESS NOTE    [name]  WUJ:[id number]  DOB: March 19, 1927  DOA: 11/14/2018 PCP: [name], MD   Brief Admission Hx: 85 year old female well-known to the medical service with history of hypothyroidism presented with progressive generalized weakness and fatigue.  She had recently been discharged from [facility] 11/02/18.    MDM/Assessment & Plan:   1. Acute on chronic diastolic congestive heart failure- the patient is being treated with intravenous diuretics.  Serum bicarb appears to be trending up indicating contraction alkalosis.  Further diuretics were held.  She underwent thoracentesis today of left pleural effusion with removal of 330 cc of fluid.  The patient had a TTE last month that revealed mild LAE, moderate TR and MR, mild AI, and moderate pulmonary hypertension. 2. COPD with longstanding chronic hypercarbic respiratory failure- continue supplemental oxygen as ordered continue ipratropium albuterol.  Patient has been unwilling to try BiPAP therapy in the past.  I suspect this is contributing to her elevated serum bicarbonate. 3. Chronic atrial fibrillation- the patient is anticoagulated fully with apixaban.  Her heart rate is well controlled with diltiazem.  Continue to monitor. 4. Hypothyroidism- she has been resumed on her home levothyroxine.  TSH is elevated at 9.9.  Review of prior TSH levels indicate that it has been very labile ranging from 3-10.  Will need to confirm the patient has been taking her medications as prescribed. 5. Hypokalemia- repleted 6. Generalized anxiety disorder-longstanding and controlled with clonazepam as needed. 7. Coronary artery disease- stable.  Continuous telemetry monitoring ordered.  8. Goals of care.  Palliative care following.  I had an honest discussion with the patient regarding her current health status and expected prognosis.  With her multiple medical problems, repeated admissions, overall frailty, I have recommended hospice services.   Patient was initially somewhat resistant to discuss hospice, but may be agreeable if hospice services can be provided in the home. At patient's request, I spoke to her caregiver, Vikki Ports regarding her prognosis and recommendations for hospice.  Chelsea agrees that she has noticed an overall decline in the patient's condition.  She is the patient and husband's caregiver during the day, but they do not have any care in the home overnight.  Subsequently, palliative care met with patient and discuss goals of care.  Goals were also discussed with patient's husband as well as her caregiver [place].  Everybody is in agreement with patient discharging home with hospice services.  Arrangements are being made for them to have caregiver present at night to provide 24/7 coverage.  Once this is been arranged, she will be discharged home with hospice services.   DVT prophylaxis: Apixaban Code Status: DNR Family Communication: Patient updated Disposition Plan: Discharge home with hospice services once 24/7 care can be arranged at home.  Hopefully this can be done in the next 24 hours.   Consultants:  Palliative care   Procedures:  Thoracentesis of left pleural effusion with removal of 330cc fluid  Antimicrobials:     Subjective: Complaining of some nausea today.  No shortness of breath.  Objective: Vitals:   11/18/18 0507 11/18/18 0727 11/18/18 1253 11/18/18 1437  BP: 124/74  116/79   Pulse: (!) 54  94   Resp: 20  16   Temp: 97.6 F (36.4 C)  98.6 F (37 C)   TempSrc: Oral     SpO2: 100% 98% 98% 98%  Weight:      Height:        Intake/Output Summary (Last 24 hours) at 11/18/2018  [place] filed at 11/18/2018 1000 Gross per 24 hour  Intake 720 ml  Output 900 ml  Net -180 ml   Filed Weights   11/16/18 0509 11/17/18 0538 11/18/18 0500  Weight: 49.9 kg 48.6 kg 48.9 kg   REVIEW OF SYSTEMS  As per history otherwise all reviewed and reported negative  Exam:  General exam: Alert,  awake, no distress Respiratory system: Clear to auscultation. Respiratory effort normal. Cardiovascular system:RRR. No murmurs, rubs, gallops. Gastrointestinal system: Abdomen is nondistended, soft and nontender. No organomegaly or masses felt. Normal bowel sounds heard. Central nervous system: Alert and oriented. No focal neurological deficits. Extremities: No C/C/E, +pedal pulses Skin: No rashes, lesions or ulcers Psychiatry: Mildly confused, pleasant    Data Reviewed: Basic Metabolic Panel: Recent Labs  Lab 11/14/18 1805 11/15/18 0426 11/16/18 0442 11/17/18 0454 11/18/18 0432  NA 136 140 139 137 137  K 2.9* 3.6 3.8 3.7 3.7  CL 85* 86* 80* 78* 83*  CO2 41* 45* 49* 50* 45*  GLUCOSE 103* 102* 121* 109* 88  BUN '17 16 22 ' 24* 19  CREATININE 0.69 0.65 0.71 0.70 0.79  CALCIUM 8.2* 8.3* 8.9 8.8* 8.3*  MG 2.0  --  2.1  --   --    Liver Function Tests: Recent Labs  Lab 11/14/18 1805  AST 31  ALT 27  ALKPHOS 101  BILITOT 1.2  PROT 7.3  ALBUMIN 3.7   No results for input(s): LIPASE, AMYLASE in the last 168 hours. No results for input(s): AMMONIA in the last 168 hours. CBC: Recent Labs  Lab 11/14/18 1805 11/17/18 0454  WBC 7.6 7.5  NEUTROABS 5.7  --   HGB 11.8* 11.1*  HCT 38.5 36.2  MCV 101.0* 102.0*  PLT 222 184   Cardiac Enzymes: Recent Labs  Lab 11/14/18 1805 11/14/18 2349  TROPONINI 0.03* 0.03*   CBG (last 3)  Recent Labs    11/16/18 0726 11/16/18 2149  GLUCAP 121* 80   Recent Results (from the past 240 hour(s))  Urine culture     Status: Abnormal   Collection Time: 11/14/18  6:05 PM  Result Value Ref Range Status   Specimen Description   Final    URINE, RANDOM Performed at [facility], [address] [place], [place] [address]    Special Requests   Final    NONE Performed at [facility], [address], [place], [place] [address]    Culture (A)  Final    <10,000 COLONIES/mL INSIGNIFICANT GROWTH Performed at [facility], [place]  [address] [place], [place] [address]    Report Status 11/16/2018 FINAL  Final  Culture, body fluid-bottle     Status: None (Preliminary result)   Collection Time: 11/17/18 12:15 PM  Result Value Ref Range Status   Specimen Description PLEURAL  Final   Special Requests BOTTLES DRAWN AEROBIC AND ANAEROBIC 10 CC EACH  Final   Culture   Final    NO GROWTH < 24 HOURS Performed at [facility], [address] [place], [place] [address]    Report Status PENDING  Incomplete  Gram stain     Status: None   Collection Time: 11/17/18 12:15 PM  Result Value Ref Range Status   Specimen Description PLEURAL  Final   Special Requests NONE  Final   Gram Stain   Final    CYTOSPIN SMEAR WBC PRESENT, PREDOMINANTLY PMN FEW MONONUCLEAR WBC PRESENT NO ORGANISMS SEEN Performed at [facility], [address] [place], [place]  [address]    Report Status 11/17/2018 FINAL  Final     Studies: Dg Chest 1 View  Result Date: 11/17/2018 CLINICAL DATA:  85 year old female with a history of left thoracentesis EXAM: CHEST  1 VIEW COMPARISON:  11/16/2018 FINDINGS: Cardiomediastinal silhouette unchanged in size and contour. Improved opacity at the left lung base with blunting of left costophrenic angle. No pneumothorax. Opacity at the right lung base persists with blunting of the right costophrenic angle and cardiophrenic angle. Reticular opacities of the lungs persist, with improving aeration. IMPRESSION: Improved left pleural effusion, with no pneumothorax. Persisting opacity at the right lung base potentially combination of pleural fluid, atelectasis/consolidation. Decreasing interstitial edema. Electronically Signed   By: Corrie Mckusick D.O.   On: 11/17/2018 12:40   US Thoracentesis Asp Pleural Space W/img Guide  Result Date: 11/17/2018 INDICATION: 85 year old female with a history of pleural effusion. EXAM: ULTRASOUND GUIDED LEFT THORACENTESIS MEDICATIONS: None. COMPLICATIONS: None PROCEDURE: An ultrasound guided  thoracentesis was thoroughly discussed with the patient and questions answered. The benefits, risks, alternatives and complications were also discussed. The patient understands and wishes to proceed with the procedure. Written consent was obtained. Ultrasound was performed to localize and mark an adequate pocket of fluid in the left chest. The area was then prepped and draped in the normal sterile fashion. 1% Lidocaine was used for local anesthesia. Under ultrasound guidance a 19 gauge, 7-cm, Yueh catheter was introduced. Thoracentesis was performed. The catheter was removed and a dressing applied. FINDINGS: A total of approximately 330 cc of serosanguineous fluid was removed. Samples were sent to the laboratory as requested by the clinical team. IMPRESSION: Status post ultrasound-guided left-sided thoracentesis. Signed, Dulcy Fanny. Dellia Nims, RPVI [facility] Radiology Electronically Signed   By: Corrie Mckusick D.O.   On: 11/17/2018 13:09     Scheduled Meds:  apixaban  2.5 mg Oral BID   diltiazem  360 mg Oral Daily   feeding supplement (ENSURE ENLIVE)  237 mL Oral BID BM   folic acid  1 mg Oral Daily   gabapentin  200 mg Oral QHS   ipratropium-albuterol  3 mL Inhalation TID   levothyroxine  75 mcg Oral Q0600   multivitamin with minerals  1 tablet Oral Daily   sodium chloride flush  3 mL Intravenous Q12H   vitamin B-12  1,000 mcg Oral Daily   Continuous Infusions:  sodium chloride      Principal Problem:   Acute on chronic diastolic CHF (congestive heart failure) (HCC) Active Problems:   COPD (chronic obstructive pulmonary disease) (HCC)   Hypothyroidism   AF (paroxysmal atrial fibrillation) (HCC)   Coronary artery disease of native artery of native heart with stable angina pectoris (HCC)   Chronic anxiety   Chronic respiratory failure with hypoxia and hypercapnia (HCC)   Hypokalemia   Palliative care by specialist   Goals of  care, counseling/discussion   Time spent: 66mns  JKathie Dike MD [facility] 11/18/2018, 6:41 PM    LOS: 3 days

## 2018-11-18 NOTE — Progress Notes (Signed)
Physical Therapy Treatment Patient Details Name: [name] MRN: [id number] DOB: 08/10/1926 Today's Date: 11/18/2018    History of Present Illness [name] is a 85 y.o. female with medical history significant for atrial fibrillation on Eliquis, chronic diastolic CHF, COPD with chronic hypoxic and hypercarbic respiratory failure, anxiety, and hypothyroidism, now presenting to emergency department for evaluation of progressive generalized weakness and fatigue.  Patient reports that she was recently discharged from SNF (on 11/02/2018) and has been experiencing some progressive fatigue and generalized weakness since that time.  She has also experienced insidious worsening in her chronic dyspnea.  She reports that the chronic cough is unchanged.  Denies any fevers or chills.  She denies any chest pain or palpitations.  She reports chronic bilateral lower extremity swelling but is uncertain if it is worse than usual at this time.    PT Comments    Pt limited by pain, weakness and nausea.  Mod A required with bed mobility today with cueing for hand and foot placement to assist.  Improved mechanics with increased ease with scooting to EOB.  Min A transfering to [facility] with ability to take ~5 small steps infront of BSC.  Pt requested to return to bed following transfer to [facility] due to nausea.  Pt left in bed with call bell within reach and bed alarm set.     Follow Up Recommendations  SNF     Equipment Recommendations  None recommended by PT    Recommendations for Other Services       Precautions / Restrictions Precautions Precautions: Fall Restrictions Weight Bearing Restrictions: No    Mobility  Bed Mobility Overal bed mobility: Needs Assistance Bed Mobility: Supine to Sit     Supine to sit: Min assist     General bed mobility comments: More assistance required for supine to sit, pt able to complete scooting with increased ease.    Transfers Overall transfer level: Needs  assistance Equipment used: Rolling walker (2 wheeled) Transfers: Sit to/from [facility] Sit to Stand: Min assist;Mod assist Stand pivot transfers: Min assist       General transfer comment: SPT to [facility].  Slow labored unsteady movements wiht occassial jerking during transfer to [facility].  Ambulation/Gait Ambulation/Gait assistance: Mod assist Gait Distance (Feet): 3 Feet Assistive device: Rolling walker (2 wheeled) Gait Pattern/deviations: Decreased step length - right;Decreased step length - left;Decreased stride length Gait velocity: decreased   General Gait Details: limited to 5-6 slow unsteady steps at bedside due to poor standing balance and c/o nausea   Stairs             Wheelchair Mobility    Modified Rankin (Stroke Patients Only)       Balance                                            Cognition Arousal/Alertness: Awake/alert Behavior During Therapy: WFL for tasks assessed/performed Overall Cognitive Status: Within Functional Limits for tasks assessed                                        Exercises      General Comments        Pertinent Vitals/Pain Pain Assessment: 0-10 Pain Score: 3  Pain Location: LBP Pain Descriptors / Indicators:  Aching;Sore Pain Intervention(s): Monitored during session;Limited activity within patient's tolerance;Repositioned;Heat applied    Home Living                      Prior Function            PT Goals (current goals can now be found in the care plan section)      Frequency    Min 3X/week      PT Plan Current plan remains appropriate    Co-evaluation              AM-PAC PT "6 Clicks" Mobility   Outcome Measure  Help needed turning from your back to your side while in a flat bed without using bedrails?: A Lot Help needed moving from lying on your back to sitting on the side of a flat bed without using bedrails?: A Lot Help needed moving  to and from a bed to a chair (including a wheelchair)?: A Lot Help needed standing up from a chair using your arms (e.g., wheelchair or bedside chair)?: A Lot Help needed to walk in hospital room?: A Lot Help needed climbing 3-5 steps with a railing? : A Lot 6 Click Score: 12    End of Session Equipment Utilized During Treatment: Gait belt;Oxygen Activity Tolerance: Patient tolerated treatment well;Patient limited by fatigue Patient left: in bed;with call bell/phone within reach;with bed alarm set(pt requested to return to bed due to nausea) Nurse Communication: Mobility status PT Visit Diagnosis: Unsteadiness on feet (R26.81);Other abnormalities of gait and mobility (R26.89);Muscle weakness (generalized) (M62.81)     Time: 3557-3220 PT Time Calculation (min) (ACUTE ONLY): 19 min  Charges:  $Therapeutic Activity: 8-22 mins                     [address], LPTA; CBIS [phone]  Aldona Lento 11/18/2018, 1:12 PM

## 2018-11-18 NOTE — TOC Transition Note (Signed)
Transition of Care [facility]) - CM/SW Discharge Note   Patient Details  Name: [name] MRN: [id number] Date of Birth: 08/26/1926  Transition of Care [facility]) CM/SW Contact:  Boneta Lucks, RN Phone Number: 11/18/2018, 1:29 PM   Clinical Narrative:     Palliative NP spoke with caregiver Vikki Ports and patient husband [name]. Plan is for Patient to DC home with [facility]  Referral discussed with [facility] of Hospice. Vikki Ports is arranging of patient to have 24/7 care at this time. If 24/7 care is worked out patient would be able to go home 11/19/18. Vito Backers is reaching out to [facility] to discuss details and assess any equipment needs. Attending and bedside nurse has been updated.     Barriers to Discharge: No Barriers Identified   Patient Goals and CMS Choice Patient states their goals for this hospitalization and ongoing recovery are:: patient wants to go home, unsure if she will agree to SNF CMS Medicare.gov Compare Post Acute Care list provided to:: Patient Choice offered to / list presented to : Patient  Discharge Placement                       Discharge Plan and Services In-house Referral: Hospice / Palliative Care Discharge Planning Services: CM Consult Post Acute Care Choice: Hospice                [facility] Agency: [facility]   Social Determinants of Health (SDOH) Interventions     Readmission Risk Interventions Readmission Risk Prevention Plan 11/17/2018 10/20/2018 10/12/2018  Transportation Screening Complete Complete Complete  PCP or Specialist Appt within 3-5 Days - Complete -  Home Care Screening - - Complete  Medication Review (RN CM) - - Complete  HRI or Home Care Consult - Complete -  Social Work Consult for Recovery Care Planning/Counseling - Complete -  Palliative Care Screening - Not Applicable -  Medication Review Press photographer) Complete Complete -  [place] or Home Care Consult Complete - -  SW Recovery Care/Counseling  Consult Complete - -  Palliative Care Screening Complete - -  Some recent data might be hidden

## 2018-11-18 NOTE — Progress Notes (Signed)
Gave warm prune juice with MOM.  Assisted back to bed and had left shoulder pain which she says flares up sometime.  Given tylenol and heat pack.

## 2018-11-18 NOTE — Care Management Important Message (Addendum)
Important Message  Patient Details  Name: [name] MRN: [id number] Date of Birth: [birth year]   Medicare Important Message Given:  Yes- read information to the patient    Tommy Medal 11/18/2018, 3:55 PM

## 2018-11-18 NOTE — Progress Notes (Addendum)
Daily Progress Note   Patient Name: [name]       Date: 11/18/2018 DOB: 16-Dec-1926  Age: 85 y.o. MRN#: [id number] Attending Physician: Kathie Dike, MD Primary Care Physician: [name], MD Admit Date: 11/14/2018  Reason for Consultation/Follow-up: Establishing goals of care  Subjective: Patient awake, alert, oriented. Complains of constipation (RN to bring milk of mag) and attempting to have BM on BSC. Also complains of jerking. Denies pain. Tells me breathing is about the same as before the thoracentesis yesterday. Does not appear in distress or discomfort.   GOC:  F/u GOC with patient at bedside after her conversation with Dr. Roderic Palau yesterday. Patient is much more willing to speak with me today compared with conversation on 4/15.   Patient tells me the plan to return home with hospice services. She asks me to further educate her and her husband on role of hospice.   Reviewed course of hospitalization including diagnoses and interventions. Explained disease trajectory of chronic, progressive conditions and recurrent hospitalization indicating disease progression.   Educated on hospice philosophy and focus on comfort measures, quality of life, dignity at EOL, and symptom management. Explained goal to prevent re-hospitalization. Patient speaks of importance of spending time with her husband and her desire to return home.   [name] shares that her caregiver, [name], is present M-F until 4:30pm. She speaks of support from family friends [employer] and [employer]. Expressed need for 24/7 along with support from hospice agency. Patient shares that her husband, [name], is in charge of fiances and is discussing caregivers with [facility].   Patient again confirms her desire  against heroic interventions including resuscitation and life support.   Answered questions and concerns regarding plan of care. Therapeutic listening as patient shares stories of her life with current husband, [name]. They have been married for ~2 years and had a lovely wedding with their closest friends and family.    **Shortly after, spoke with caregiver [name]) and husband [name]) via telephone. Discussed course of hospitalization including diagnoses and interventions. [name] shares the conversation with Dr. Roderic Palau yesterday and understanding of chronic, irreversible conditions including recommendation for hospice. [name] needs to arrange evening/night caregivers.  Educated on hospice philosophy and role of comfort, quality of life, dignity at EOL. Also role to focus on symptom management and preventing re-hospitalization.  [name] and [name] agree with discharge plan home with hospice. [name] also understands his wife's wishes against heroic interventions at EOL including resuscitation/life support.   Answered questions and concerns. PMT contact information given.    Length of Stay: 3  Current Medications: Scheduled Meds:  . apixaban  2.5 mg Oral BID  . diltiazem  360 mg Oral Daily  . feeding supplement (ENSURE ENLIVE)  237 mL Oral BID BM  . folic acid  1 mg Oral Daily  . gabapentin  200 mg Oral QHS  . ipratropium-albuterol  3 mL Inhalation TID  . levothyroxine  75 mcg Oral Q0600  . multivitamin with minerals  1 tablet Oral Daily  . sodium chloride flush  3 mL Intravenous Q12H  . vitamin B-12  1,000 mcg Oral Daily    Continuous Infusions: . sodium chloride      PRN Meds: sodium chloride, acetaminophen, albuterol, clonazePAM, guaiFENesin, magnesium hydroxide, ondansetron (ZOFRAN) IV, sodium chloride flush  Physical Exam Vitals signs and nursing note reviewed.  Constitutional:      General: She is awake.     Appearance: She is cachectic. She is ill-appearing.  HENT:      Head: Normocephalic and atraumatic.  Pulmonary:     Effort: No tachypnea, accessory muscle usage or respiratory distress.  Skin:    General: Skin is warm and dry.  Neurological:     Mental Status: She is alert and oriented to person, place, and time.  Psychiatric:        Mood and Affect: Mood normal.        Speech: Speech normal.        Behavior: Behavior normal.        Cognition and Memory: Cognition normal.            Vital Signs: BP 124/74 (BP Location: Left Arm)   Pulse (!) 54   Temp 97.6 F (36.4 C) (Oral)   Resp 20   Ht 5\' 7"  (1.702 m)   Wt 48.9 kg   SpO2 98%   BMI 16.88 kg/m  SpO2: SpO2: 98 % O2 Device: O2 Device: Nasal Cannula O2 Flow Rate: O2 Flow Rate (L/min): 2 L/min  Intake/output summary:   Intake/Output Summary (Last 24 hours) at 11/18/2018 0930 Last data filed at 11/18/2018 0500 Gross per 24 hour  Intake 720 ml  Output 900 ml  Net -180 ml   LBM: Last BM Date: 11/17/18 Baseline Weight: Weight: 50.1 kg Most recent weight: Weight: 48.9 kg       Palliative Assessment/Data: PPS 50%   Flowsheet Rows     Most Recent Value  Intake Tab  Referral Department  Hospitalist  Unit at Time of Referral  Med/Surg Unit  Palliative Care Primary Diagnosis  Cardiac  Palliative Care Type  New Palliative care  Date first seen by Palliative Care  11/16/18  Clinical Assessment  Palliative Performance Scale Score  50%  Psychosocial & Spiritual Assessment  Palliative Care Outcomes  Patient/Family meeting held?  Yes  Who was at the meeting?  patient  Palliative Care Outcomes  Clarified goals of care, Provided end of life care assistance, Provided psychosocial or spiritual support, ACP counseling assistance      Patient Active Problem List   Diagnosis Date Noted  . Palliative care by specialist   . Goals of care, counseling/discussion   . Acute on chronic diastolic CHF (congestive heart failure) ([place]) 11/14/2018  . Chronic respiratory failure with hypoxia and  hypercapnia ([place]) 11/14/2018  . Hypokalemia  11/14/2018  . Generalized weakness   . Coronary artery disease of native artery of native heart with stable angina pectoris ([place]) 10/24/2018  . Chronic pain of multiple joints 10/24/2018  . Insomnia secondary to anxiety 10/24/2018  . Chronic anxiety 10/24/2018  . Malnutrition of moderate degree 10/13/2018  . Cellulitis, leg 06/28/2018  . Chronic diastolic CHF (congestive heart failure) ([place]) 06/28/2018  . AF (paroxysmal atrial fibrillation) ([place]) 06/28/2018  . Poor appetite 05/04/2018  . Atrial fibrillation with RVR ([place]) 03/30/2018  . Dysphagia 03/30/2018  . Loss of weight 01/20/2018  . Nausea and vomiting 01/20/2018  . Peripheral neuropathy 08/30/2017  . Peri-prosthetic fracture of femur following total hip arthroplasty 10/13/2014  . Protein-calorie malnutrition, severe ([place]) 10/12/2014  . COPD (chronic obstructive pulmonary disease) ([place])   . Acute cystitis without hematuria   . Hypothyroidism   . Syncope and collapse 07/02/2014  . Osteoporosis 04/10/2013  . Vaginal atrophy 04/10/2013    Palliative Care Assessment & Plan   Patient Profile: 85 y.o. female  with past medical history of diastolic CHF, COPD on home oxygen, CAD, HLD, afib on eliquis, depression, anxiety, fibromyalgia admitted on 11/14/2018 with generalized weakness and fatigue. Recently discharged from SNF on 11/02/18. Hospital admission for acute on chronic diastolic CHF receiving IV diuresis. TTE last month revealed mild LAE, moderate TR and MR, mild AI, and moderate pulmonary hypertension. Palliative medicine consultation for goals of care.    Assessment: Acute on chronic diastolic CHF Hx of COPD Chronic atrial fibrillation Hypothyroidism CAD Left pleural effusion  Recommendations/Plan:  DNR. No heroic interventions at EOL.   Continue current medical management per attending. Medically maximize with plan to discharge home with hospice services. Patient/family  understand hospice philosophy.  RN CM notified to arrange home with hospice as well as assistance with HS caregivers. Patient needs 24/7 care.    Code Status: DNR   Code Status Orders  (From admission, onward)         Start     Ordered   11/14/18 2000  Do not attempt resuscitation (DNR)  Continuous    Question Answer Comment  In the event of cardiac or respiratory ARREST Do not call a "code blue"   In the event of cardiac or respiratory ARREST Do not perform Intubation, CPR, defibrillation or ACLS   In the event of cardiac or respiratory ARREST Use medication by any route, position, wound care, and other measures to relive pain and suffering. May use oxygen, suction and manual treatment of airway obstruction as needed for comfort.      11/14/18 2003        Code Status History    Date Active Date Inactive Code Status Order ID Comments User Context   10/12/2018 0302 10/20/2018 1850 DNR [id number]  Oswald Hillock, MD Inpatient   06/28/2018 1334 07/01/2018 2012 Full Code [id number]  Orson Eva, MD ED   03/29/2018 2017 04/06/2018 1740 Full Code [id number]  Norval Morton, MD ED   10/13/2014 1605 10/17/2014 1803 Full Code [id number]  Rod Can, MD Inpatient   10/11/2014 0256 10/13/2014 1605 Partial Code [id number]  Theressa Millard, MD Inpatient   09/22/2014 1215 09/25/2014 1843 DNR [id number]  [name], MD Inpatient   09/21/2014 1519 09/22/2014 1215 DNR [id number]  Kelvin Cellar, MD Inpatient   07/02/2014 1713 07/03/2014 2319 Full Code [id number]  Velvet Bathe, MD Inpatient    Advance Directive Documentation     Most Recent Value  Type of Advance Directive  Out of  facility DNR (pink MOST or yellow form)  Pre-existing out of facility DNR order (yellow form or pink MOST form)  -  "MOST" Form in Place?  -       Prognosis:   Poor long-term prognosis with chronic CHF and chronic, progressive COPD. Declining functional status with recurrent hospitalization.   Discharge Planning:   Home with Hospice  Care plan was discussed with patient, husband/caregiver, RN, Dr Roderic Palau  Thank you for allowing the Palliative Medicine Team to assist in the care of this patient.   Time In: 0900 Time Out: 1020 Total Time 80 Prolonged Time Billed  yes      Greater than 50%  of this time was spent counseling and coordinating care related to the above assessment and plan.  Ihor Dow, FNP-C Palliative Medicine Team  Phone: [phone] Fax: [phone]  Please contact Palliative Medicine Team phone at [phone] for questions and concerns.

## 2018-11-19 LAB — BASIC METABOLIC PANEL
Anion gap: 8 (ref 5–15)
BUN: 17 mg/dL (ref 8–23)
CO2: 42 mmol/L — ABNORMAL HIGH (ref 22–32)
Calcium: 8.6 mg/dL — ABNORMAL LOW (ref 8.9–10.3)
Chloride: 89 mmol/L — ABNORMAL LOW (ref 98–111)
Creatinine, Ser: 0.7 mg/dL (ref 0.44–1.00)
GFR calc Af Amer: >60 mL/min (ref >60)
GFR calc non Af Amer: >60 mL/min (ref >60)
Glucose, Bld: 103 mg/dL — ABNORMAL HIGH (ref 70–99)
Potassium: 4.1 mmol/L (ref 3.5–5.1)
Sodium: 139 mmol/L (ref 135–145)

## 2018-11-19 NOTE — Plan of Care (Signed)
Patient is progressing with care plan. 

## 2018-11-19 NOTE — Progress Notes (Signed)
PROGRESS NOTE    [name]  GDJ:[id number]  DOB: [birth year]  DOA: 11/14/2018 PCP: [name], MD   Brief Admission Hx: 85 year old female well-known to the medical service with history of hypothyroidism presented with progressive generalized weakness and fatigue.  She had recently been discharged from [facility] 11/02/18.    MDM/Assessment & Plan:   1. Acute on chronic diastolic congestive heart failure- the patient is being treated with intravenous diuretics.  Serum bicarb appears to be trending up indicating contraction alkalosis.  Further diuretics were held.  She underwent thoracentesis today of left pleural effusion with removal of 330 cc of fluid.  The patient had a TTE last month that revealed mild LAE, moderate TR and MR, mild AI, and moderate pulmonary hypertension. 2. COPD with longstanding chronic hypercarbic respiratory failure- continue supplemental oxygen as ordered continue ipratropium albuterol.  Patient has been unwilling to try BiPAP therapy in the past.  I suspect this is contributing to her elevated serum bicarbonate. 3. Chronic atrial fibrillation- the patient is anticoagulated fully with apixaban.  Her heart rate is well controlled with diltiazem.  Continue to monitor. 4. Hypothyroidism- she has been resumed on her home levothyroxine.  TSH is elevated at 9.9.  Review of prior TSH levels indicate that it has been very labile ranging from 3-10.  Will need to confirm the patient has been taking her medications as prescribed. 5. Hypokalemia- repleted 6. Generalized anxiety disorder-longstanding and controlled with clonazepam as needed. 7. Coronary artery disease- stable.  Continuous telemetry monitoring ordered.  8. Goals of care.  Palliative care following.  I had an honest discussion with the patient regarding her current health status and expected prognosis.  With her multiple medical problems, repeated admissions, overall frailty, I have recommended hospice services.   Patient was initially somewhat resistant to discuss hospice, but may be agreeable if hospice services can be provided in the home. At patient's request, I spoke to her caregiver, Vikki Ports regarding her prognosis and recommendations for hospice.  Chelsea agrees that she has noticed an overall decline in the patient's condition.  She is the patient and husband's caregiver during the day, but they do not have any care in the home overnight.  Subsequently, palliative care met with patient and discuss goals of care.  Goals were also discussed with patient's husband as well as her caregiver [place].  Everybody is in agreement with patient discharging home with hospice services.  Arrangements are being made for them to have caregiver present at night to provide 24/7 coverage.  It is likely that these arrangements will be made till Monday.  I was also contacted by patient's stepdaughter, Lou Cal.  She reports that patient's husband as well as herself have concerns about patient returning home with hospice services and would like to further pursue residential hospice.  Will likely need palliative care to help sort out further disposition issues with hospice.   DVT prophylaxis: Apixaban Code Status: DNR Family Communication: Patient updated Disposition Plan: Plan is to discharge home with hospice services.   Consultants:  Palliative care   Procedures:  Thoracentesis of left pleural effusion with removal of 330cc fluid  Antimicrobials:     Subjective: Nausea is better today.  No vomiting.  Minimal p.o. intake.  Denies shortness of breath.  Objective: Vitals:   11/19/18 0534 11/19/18 0736 11/19/18 1403 11/19/18 1500  BP: (!) 144/57   133/66  Pulse: 81   89  Resp: 20   18  Temp: 97.7 F (36.5 C)  TempSrc: Oral     SpO2: 99% 97% 98% 93%  Weight:      Height:        Intake/Output Summary (Last 24 hours) at 11/19/2018 1645 Last data filed at 11/19/2018 0900 Gross per 24 hour  Intake  123 ml  Output 1925 ml  Net -1802 ml   Filed Weights   11/17/18 0538 11/18/18 0500 11/19/18 0500  Weight: 48.6 kg 48.9 kg 48.8 kg   REVIEW OF SYSTEMS  As per history otherwise all reviewed and reported negative  Exam:  General exam: Sleeping, wakes up to voice, no distress Respiratory system: crackles at bases. Respiratory effort normal. Cardiovascular system:RRR. No murmurs, rubs, gallops. Gastrointestinal system: Abdomen is nondistended, soft and nontender. No organomegaly or masses felt. Normal bowel sounds heard. Central nervous system: No focal neurological deficits. Extremities: No C/C/E, +pedal pulses Skin: No rashes, lesions or ulcers Psychiatry: confused, pleasant     Data Reviewed: Basic Metabolic Panel: Recent Labs  Lab 11/14/18 1805 11/15/18 0426 11/16/18 0442 11/17/18 0454 11/18/18 0432 11/19/18 0636  NA 136 140 139 137 137 139  K 2.9* 3.6 3.8 3.7 3.7 4.1  CL 85* 86* 80* 78* 83* 89*  CO2 41* 45* 49* 50* 45* 42*  GLUCOSE 103* 102* 121* 109* 88 103*  BUN '17 16 22 ' 24* 19 17  CREATININE 0.69 0.65 0.71 0.70 0.79 0.70  CALCIUM 8.2* 8.3* 8.9 8.8* 8.3* 8.6*  MG 2.0  --  2.1  --   --   --    Liver Function Tests: Recent Labs  Lab 11/14/18 1805  AST 31  ALT 27  ALKPHOS 101  BILITOT 1.2  PROT 7.3  ALBUMIN 3.7   No results for input(s): LIPASE, AMYLASE in the last 168 hours. No results for input(s): AMMONIA in the last 168 hours. CBC: Recent Labs  Lab 11/14/18 1805 11/17/18 0454  WBC 7.6 7.5  NEUTROABS 5.7  --   HGB 11.8* 11.1*  HCT 38.5 36.2  MCV 101.0* 102.0*  PLT 222 184   Cardiac Enzymes: Recent Labs  Lab 11/14/18 1805 11/14/18 2349  TROPONINI 0.03* 0.03*   CBG (last 3)  Recent Labs    11/16/18 2149  GLUCAP 80   Recent Results (from the past 240 hour(s))  Urine culture     Status: Abnormal   Collection Time: 11/14/18  6:05 PM  Result Value Ref Range Status   Specimen Description   Final    URINE, RANDOM Performed at [facility], [address], [place], [place] [address]    Special Requests   Final    NONE Performed at [facility], [address], [place], [place] [address]    Culture (A)  Final    <10,000 COLONIES/mL INSIGNIFICANT GROWTH Performed at [facility], [place] [address] [place], [place] [address]    Report Status 11/16/2018 FINAL  Final  Culture, body fluid-bottle     Status: None (Preliminary result)   Collection Time: 11/17/18 12:15 PM  Result Value Ref Range Status   Specimen Description PLEURAL  Final   Special Requests BOTTLES DRAWN AEROBIC AND ANAEROBIC 10 CC EACH  Final   Culture   Final    NO GROWTH 2 DAYS Performed at [facility], [address], [place], [place] [address]    Report Status PENDING  Incomplete  Gram stain     Status: None   Collection Time: 11/17/18 12:15 PM  Result Value Ref Range Status   Specimen Description PLEURAL  Final   Special Requests NONE  Final   Gram Stain   Final    CYTOSPIN SMEAR WBC PRESENT, PREDOMINANTLY PMN FEW MONONUCLEAR WBC PRESENT NO ORGANISMS SEEN Performed at [facility], [address] [place], [place] [address]    Report Status 11/17/2018 FINAL  Final     Studies: No results found.   Scheduled Meds: . apixaban  2.5 mg Oral BID  . diltiazem  360 mg Oral Daily  . feeding supplement (ENSURE ENLIVE)  237 mL Oral BID BM  . folic acid  1 mg Oral Daily  . gabapentin  200 mg Oral QHS  . ipratropium-albuterol  3 mL Inhalation TID  . levothyroxine  75 mcg Oral Q0600  . multivitamin with minerals  1 tablet Oral Daily  . sodium chloride flush  3 mL Intravenous Q12H  . vitamin B-12  1,000 mcg Oral Daily   Continuous Infusions: . sodium chloride      Principal Problem:   Acute on chronic diastolic CHF (congestive heart failure) (HCC) Active Problems:   COPD (chronic obstructive pulmonary disease) (HCC)   Hypothyroidism   AF (paroxysmal atrial fibrillation) (HCC)   Coronary artery disease of native artery of native  heart with stable angina pectoris (HCC)   Chronic anxiety   Chronic respiratory failure with hypoxia and hypercapnia (HCC)   Hypokalemia   Palliative care by specialist   Goals of care, counseling/discussion   Time spent: 21mns  JKathie Dike MD [facility] 11/19/2018, 4:45 PM    LOS: 4 days

## 2018-11-20 LAB — CBC
HCT: 39.8 % (ref 36.0–46.0)
Hemoglobin: 12.2 g/dL (ref 12.0–15.0)
MCH: 31.1 pg (ref 26.0–34.0)
MCHC: 30.7 g/dL (ref 30.0–36.0)
MCV: 101.5 fL — ABNORMAL HIGH (ref 80.0–100.0)
Platelets: 174 10*3/uL (ref 150–400)
RBC: 3.92 MIL/uL (ref 3.87–5.11)
RDW: 15.5 % (ref 11.5–15.5)
WBC: 7.2 10*3/uL (ref 4.0–10.5)
nRBC: 0 % (ref 0.0–0.2)

## 2018-11-20 NOTE — Progress Notes (Signed)
PROGRESS NOTE    [name]  MPN:[id number]  DOB: 01/28/1927  DOA: 11/14/2018 PCP: [name], MD   Brief Admission Hx: 85 year old female well-known to the medical service with history of hypothyroidism presented with progressive generalized weakness and fatigue.  She had recently been discharged from [facility] 11/02/18.    MDM/Assessment & Plan:   1. Acute on chronic diastolic congestive heart failure- the patient is being treated with intravenous diuretics.  Serum bicarb appears to be trending up indicating contraction alkalosis.  Further diuretics were held.  She underwent thoracentesis of left pleural effusion with removal of 330 cc of fluid.  The patient had a TTE last month that revealed mild LAE, moderate TR and MR, mild AI, and moderate pulmonary hypertension. 2. COPD with longstanding chronic hypercarbic respiratory failure- continue supplemental oxygen as ordered continue ipratropium albuterol.  Patient has been unwilling to try BiPAP therapy.  I suspect this is contributing to her elevated serum bicarbonate. 3. Chronic atrial fibrillation- the patient is anticoagulated fully with apixaban.  Her heart rate is well controlled with diltiazem.  Continue to monitor. 4. Hypothyroidism- she has been resumed on her home levothyroxine.   5. Hypokalemia- repleted 6. Generalized anxiety disorder-longstanding and controlled with clonazepam as needed. 7. Coronary artery disease- stable.    8. Goals of care.  Palliative care following.  I had an honest discussion with the patient regarding her current health status and expected prognosis.  With her multiple medical problems, repeated admissions, overall frailty, I have recommended hospice services.  Patient was initially somewhat resistant to discuss hospice, but then became agreeable if hospice services can be provided in the home. At patient's request, I spoke to her caregiver, Vikki Ports regarding her prognosis and recommendations for hospice.   Chelsea agrees that she has noticed an overall decline in the patient's condition.  She is both the patient and patient's husband's caregiver during the day, but they do not have any care in the home overnight.  Subsequently, palliative care met with patient and discussed goals of care.  Goals were also discussed with patient's husband as well as their caregiver [place].  Everybody was in agreement with patient discharging home with hospice services.  Arrangements were being made for them to have a caregiver present at night to provide 24/7 coverage at home.  It is likely that these arrangements would not be made untill Monday.  On 4/18, I was also contacted by patient's stepdaughter, Lou Cal.  She reports that she had spoken to patient's husband about the discharge plan, and they both had concerns about patient returning home with hospice services.  I followed up with the patient's husband today regarding the discharge plan and he is indicating that he would prefer the patient to discharge to residential hospice.  He will discussed this with the patient, but feels that she would be more appropriate for residential hospice.  Will need to discuss with palliative care in a.m.Marland Kitchen   DVT prophylaxis: Apixaban Code Status: DNR Family Communication: Discussed with patient's husband over the phone Disposition Plan: Family wishes to consider residential hospice at this time.   Consultants:  Palliative care   Procedures:  Thoracentesis of left pleural effusion with removal of 330cc fluid  Antimicrobials:     Subjective: No further nausea.  Sitting in chair.  Feels her breathing is unchanged.  Objective: Vitals:   11/20/18 0700 11/20/18 0853 11/20/18 1421 11/20/18 1440  BP: 138/71  (!) 150/86   Pulse: 85  (!) 58  Resp: 18  17   Temp: (!) 97.5 F (36.4 C)  97.8 F (36.6 C)   TempSrc: Oral  Oral   SpO2: 100% 93% 97% (!) 86%  Weight:      Height:        Intake/Output Summary (Last 24  hours) at 11/20/2018 1447 Last data filed at 11/20/2018 1300 Gross per 24 hour  Intake 603 ml  Output 200 ml  Net 403 ml   Filed Weights   11/19/18 0500 11/20/18 0500 11/20/18 0501  Weight: 48.8 kg 48.7 kg 47 kg   REVIEW OF SYSTEMS  As per history otherwise all reviewed and reported negative  Exam:  General exam: Alert, awake, no distress, cachectic Respiratory system: Diminished breath sounds at bases. Respiratory effort normal. Cardiovascular system:RRR. No murmurs, rubs, gallops. Gastrointestinal system: Abdomen is nondistended, soft and nontender. No organomegaly or masses felt. Normal bowel sounds heard. Central nervous system:  No focal neurological deficits. Extremities: No C/C/E, +pedal pulses Skin: No rashes, lesions or ulcers Psychiatry: Confused, pleasant   Data Reviewed: Basic Metabolic Panel: Recent Labs  Lab 11/14/18 1805 11/15/18 0426 11/16/18 0442 11/17/18 0454 11/18/18 0432 11/19/18 0636  NA 136 140 139 137 137 139  K 2.9* 3.6 3.8 3.7 3.7 4.1  CL 85* 86* 80* 78* 83* 89*  CO2 41* 45* 49* 50* 45* 42*  GLUCOSE 103* 102* 121* 109* 88 103*  BUN '17 16 22 ' 24* 19 17  CREATININE 0.69 0.65 0.71 0.70 0.79 0.70  CALCIUM 8.2* 8.3* 8.9 8.8* 8.3* 8.6*  MG 2.0  --  2.1  --   --   --    Liver Function Tests: Recent Labs  Lab 11/14/18 1805  AST 31  ALT 27  ALKPHOS 101  BILITOT 1.2  PROT 7.3  ALBUMIN 3.7   No results for input(s): LIPASE, AMYLASE in the last 168 hours. No results for input(s): AMMONIA in the last 168 hours. CBC: Recent Labs  Lab 11/14/18 1805 11/17/18 0454 11/20/18 0524  WBC 7.6 7.5 7.2  NEUTROABS 5.7  --   --   HGB 11.8* 11.1* 12.2  HCT 38.5 36.2 39.8  MCV 101.0* 102.0* 101.5*  PLT 222 184 174   Cardiac Enzymes: Recent Labs  Lab 11/14/18 1805 11/14/18 2349  TROPONINI 0.03* 0.03*   CBG (last 3)  No results for input(s): GLUCAP in the last 72 hours. Recent Results (from the past 240 hour(s))  Urine culture     Status:  Abnormal   Collection Time: 11/14/18  6:05 PM  Result Value Ref Range Status   Specimen Description   Final    URINE, RANDOM Performed at [facility], [address], [place], [place] [address]    Special Requests   Final    NONE Performed at [facility], [address], [place], [place] [address]    Culture (A)  Final    <10,000 COLONIES/mL INSIGNIFICANT GROWTH Performed at [facility] [address], [place], [place] [address]    Report Status 11/16/2018 FINAL  Final  Culture, body fluid-bottle     Status: None (Preliminary result)   Collection Time: 11/17/18 12:15 PM  Result Value Ref Range Status   Specimen Description PLEURAL  Final   Special Requests BOTTLES DRAWN AEROBIC AND ANAEROBIC 10 CC EACH  Final   Culture   Final    NO GROWTH 3 DAYS Performed at [facility], [address] [place], [place] [address]    Report Status PENDING  Incomplete  Gram stain     Status: None   Collection Time: 11/17/18 12:15 PM  Result Value Ref Range Status   Specimen Description PLEURAL  Final   Special Requests NONE  Final   Gram Stain   Final    CYTOSPIN SMEAR WBC PRESENT, PREDOMINANTLY PMN FEW MONONUCLEAR WBC PRESENT NO ORGANISMS SEEN Performed at [facility], [address], [place], [place] [address]    Report Status 11/17/2018 FINAL  Final     Studies: No results found.   Scheduled Meds: . diltiazem  360 mg Oral Daily  . feeding supplement (ENSURE ENLIVE)  237 mL Oral BID BM  . folic acid  1 mg Oral Daily  . ipratropium-albuterol  3 mL Inhalation TID  . levothyroxine  75 mcg Oral Q0600  . multivitamin with minerals  1 tablet Oral Daily  . sodium chloride flush  3 mL Intravenous Q12H  . vitamin B-12  1,000 mcg Oral Daily   Continuous Infusions: . sodium chloride      Principal Problem:   Acute on chronic diastolic CHF (congestive heart failure) (HCC) Active Problems:   COPD (chronic obstructive pulmonary disease) (HCC)   Hypothyroidism   AF  (paroxysmal atrial fibrillation) (HCC)   Coronary artery disease of native artery of native heart with stable angina pectoris (HCC)   Chronic anxiety   Chronic respiratory failure with hypoxia and hypercapnia (HCC)   Hypokalemia   Palliative care by specialist   Goals of care, counseling/discussion   Time spent: 47mns  JKathie Dike MD [facility] 11/20/2018, 2:47 PM    LOS: 5 days

## 2018-11-20 NOTE — Plan of Care (Signed)

## 2018-11-21 DIAGNOSIS — Z7189 Other specified counseling: Secondary | ICD-10-CM

## 2018-11-21 LAB — CBC
HCT: 42.7 % (ref 36.0–46.0)
Hemoglobin: 13.1 g/dL (ref 12.0–15.0)
MCH: 30.8 pg (ref 26.0–34.0)
MCHC: 30.7 g/dL (ref 30.0–36.0)
MCV: 100.2 fL — ABNORMAL HIGH (ref 80.0–100.0)
Platelets: 184 10*3/uL (ref 150–400)
RBC: 4.26 MIL/uL (ref 3.87–5.11)
RDW: 15.7 % — ABNORMAL HIGH (ref 11.5–15.5)
WBC: 8.4 10*3/uL (ref 4.0–10.5)
nRBC: 0 % (ref 0.0–0.2)

## 2018-11-21 LAB — SARS CORONAVIRUS 2 BY RT PCR (HOSPITAL ORDER, PERFORMED IN ~~LOC~~ HOSPITAL LAB): SARS Coronavirus 2: NEGATIVE

## 2018-11-21 LAB — PATHOLOGIST SMEAR REVIEW

## 2018-11-21 NOTE — Progress Notes (Addendum)
PROGRESS NOTE    [name]  ZOX:[id number]  DOB: [birth year]  DOA: 11/14/2018 PCP: [name], MD   Brief Admission Hx: 85 year old female with advanced COPD, chronic diastolic congestive heart failure, hypothyroidism presented with progressive shortness of breath, weakness and appears to be at the end of life.  MDM/Assessment & Plan:   1. Acute on chronic diastolic congestive heart failure-patient received maximal medical treatment and has had no significant meaningful improvement.  She is now awaiting for residential hospice placement. 2. Advanced COPD- she is now at end stages of her disease and at end-of-life and waiting for hospice placement.  Will obtain covid 19 testing as protocol for hospice admission.  3. Chronic atrial fib - apixaban for anticoagulation while in acute care hospital, diltiazem is controlling heart rate.  4. Hypothyroidism - levothyroxine.  5. Severe protein calorie malnutrition - pt at end of life.   DVT prophylaxis: apixaban Code Status: DNR  Family Communication: husband  Disposition Plan: prognosis less than 3 weeks, residential hospice  Consultants:  Palliative medicine   Procedures:  thoracentesis  Antimicrobials:  n/a  Subjective: Pt appears to be comfortable but not responsive.   Objective: Vitals:   11/20/18 2133 11/21/18 0500 11/21/18 0634 11/21/18 0734  BP: 107/73  137/81   Pulse: 90  (!) 53   Resp: 15  18   Temp: 97.9 F (36.6 C)  98 F (36.7 C)   TempSrc: Oral  Oral   SpO2: 100%  100% 100%  Weight:  46.7 kg    Height:        Intake/Output Summary (Last 24 hours) at 11/21/2018 1101 Last data filed at 11/21/2018 0700 Gross per 24 hour  Intake 243 ml  Output 600 ml  Net -357 ml   Filed Weights   11/20/18 0500 11/20/18 0501 11/21/18 0500  Weight: 48.7 kg 47 kg 46.7 kg   REVIEW OF SYSTEMS  As per history otherwise all reviewed and reported negative  Exam:  General exam: emaciate female lying in bed  mostly unresponsive, mouth breathing.  Respiratory system: diffuse wheezing and some rales heard.  Cardiovascular system: S1 & S2 heard. No pedal edema. Gastrointestinal system: Abdomen is nondistended, soft and nontender. Normal bowel sounds heard. Central nervous system: lethargic.  Extremities: no clubbing or edema.  Data Reviewed: Basic Metabolic Panel: Recent Labs  Lab 11/14/18 1805 11/15/18 0426 11/16/18 0442 11/17/18 0454 11/18/18 0432 11/19/18 0636  NA 136 140 139 137 137 139  K 2.9* 3.6 3.8 3.7 3.7 4.1  CL 85* 86* 80* 78* 83* 89*  CO2 41* 45* 49* 50* 45* 42*  GLUCOSE 103* 102* 121* 109* 88 103*  BUN 17 16 22  24* 19 17  CREATININE 0.69 0.65 0.71 0.70 0.79 0.70  CALCIUM 8.2* 8.3* 8.9 8.8* 8.3* 8.6*  MG 2.0  --  2.1  --   --   --    Liver Function Tests: Recent Labs  Lab 11/14/18 1805  AST 31  ALT 27  ALKPHOS 101  BILITOT 1.2  PROT 7.3  ALBUMIN 3.7   No results for input(s): LIPASE, AMYLASE in the last 168 hours. No results for input(s): AMMONIA in the last 168 hours. CBC: Recent Labs  Lab 11/14/18 1805 11/17/18 0454 11/20/18 0524 11/21/18 0455  WBC 7.6 7.5 7.2 8.4  NEUTROABS 5.7  --   --   --   HGB 11.8* 11.1* 12.2 13.1  HCT 38.5 36.2 39.8 42.7  MCV 101.0* 102.0* 101.5* 100.2*  PLT 222 184  174 184   Cardiac Enzymes: Recent Labs  Lab 11/14/18 1805 11/14/18 2349  TROPONINI 0.03* 0.03*   CBG (last 3)  No results for input(s): GLUCAP in the last 72 hours. Recent Results (from the past 240 hour(s))  Urine culture     Status: Abnormal   Collection Time: 11/14/18  6:05 PM  Result Value Ref Range Status   Specimen Description   Final    URINE, RANDOM Performed at [facility], [address], [place], [place] [address]    Special Requests   Final    NONE Performed at [facility], [address], [place], [place] [address]    Culture (A)  Final    <10,000 COLONIES/mL INSIGNIFICANT GROWTH Performed at [facility] [address],  [place], [place] [address]    Report Status 11/16/2018 FINAL  Final  Culture, body fluid-bottle     Status: None (Preliminary result)   Collection Time: 11/17/18 12:15 PM  Result Value Ref Range Status   Specimen Description PLEURAL  Final   Special Requests BOTTLES DRAWN AEROBIC AND ANAEROBIC 10 CC EACH  Final   Culture   Final    NO GROWTH 4 DAYS Performed at [facility], [address] [place], [place] [address]    Report Status PENDING  Incomplete  Gram stain     Status: None   Collection Time: 11/17/18 12:15 PM  Result Value Ref Range Status   Specimen Description PLEURAL  Final   Special Requests NONE  Final   Gram Stain   Final    CYTOSPIN SMEAR WBC PRESENT, PREDOMINANTLY PMN FEW MONONUCLEAR WBC PRESENT NO ORGANISMS SEEN Performed at [facility], [address] [place], [place] [address]    Report Status 11/17/2018 FINAL  Final     Studies: No results found.   Scheduled Meds: . diltiazem  360 mg Oral Daily  . feeding supplement (ENSURE ENLIVE)  237 mL Oral BID BM  . folic acid  1 mg Oral Daily  . ipratropium-albuterol  3 mL Inhalation TID  . levothyroxine  75 mcg Oral Q0600  . multivitamin with minerals  1 tablet Oral Daily  . sodium chloride flush  3 mL Intravenous Q12H  . vitamin B-12  1,000 mcg Oral Daily   Continuous Infusions: . sodium chloride      Principal Problem:   Acute on chronic diastolic CHF (congestive heart failure) (HCC) Active Problems:   COPD (chronic obstructive pulmonary disease) (HCC)   Hypothyroidism   AF (paroxysmal atrial fibrillation) (HCC)   Coronary artery disease of native artery of native heart with stable angina pectoris (HCC)   Chronic anxiety   Chronic respiratory failure with hypoxia and hypercapnia ([place])   Hypokalemia   Palliative care by specialist   Goals of care, counseling/discussion  Time spent:   Irwin Brakeman, MD [facility] 11/21/2018, 11:01 AM    LOS: 6 days  How to contact the [facility] Attending or  Consulting provider [employer] or covering provider during after hours [place], for this patient?  1. Check the care team in [facility] and look for a) attending/consulting [facility] provider listed and b) the [facility] team listed 2. Log into [email or web address] and use [place]'s universal password to access. If you do not have the password, please contact the hospital operator. 3. Locate the [facility] provider you are looking for under [facility] and page to a number that you can be directly reached. 4. If you still have difficulty reaching the  provider, please page the [facility] (Director on Call) for the Hospitalists listed on amion for assistance.

## 2018-11-21 NOTE — Progress Notes (Signed)
Mobility Note  Patient Details  Name: [name] MRN: [id number] Date of Birth: 08/05/1926 Today's Date: 11/21/2018    Pt requested to return to bed following sitting in chair for approximately ~30 min due to fatigue and uncomfortable in chair. Min A with transfer and able to make 3-4 small steps back to bed.  Pt left with call bell within reach and bed alarm set. RN aware of status.  [address], LPTA; [place]   Aldona Lento 11/21/2018, 1:51 PM

## 2018-11-21 NOTE — Care Management (Signed)
New consult for residential hospice per family request. Per attending patient prognosis is less than 3 weeks. [facility] of Hospice notified. Patient will need a negative Covid test to transfer to hospice facility.  A RN from Hospice is coming to evaluate patient today.

## 2018-11-21 NOTE — Progress Notes (Signed)
Physical Therapy Note  Patient Details  Name: [name] MRN: [id number] Date of Birth: 01/27/1927 Today's Date: 11/21/2018    Attempted PT session, unable to waken pt with loud sounds and gentle shaking.  Pt left in bed with call bell wihtin reach, will attempt later today if available.  [address], LPTA; [place]    Aldona Lento 11/21/2018, 9:54 AM

## 2018-11-21 NOTE — Progress Notes (Signed)
Physical Therapy Treatment Patient Details Name: [name] MRN: [id number] DOB: 04/12/1927 Today's Date: 11/21/2018    History of Present Illness [name] is a 85 y.o. female with medical history significant for atrial fibrillation on Eliquis, chronic diastolic CHF, COPD with chronic hypoxic and hypercarbic respiratory failure, anxiety, and hypothyroidism, now presenting to emergency department for evaluation of progressive generalized weakness and fatigue.  Patient reports that she was recently discharged from SNF (on 11/02/2018) and has been experiencing some progressive fatigue and generalized weakness since that time.  She has also experienced insidious worsening in her chronic dyspnea.  She reports that the chronic cough is unchanged.  Denies any fevers or chills.  She denies any chest pain or palpitations.  She reports chronic bilateral lower extremity swelling but is uncertain if it is worse than usual at this time.    PT Comments    Pt supine in bed and willing to participate, reports generalized pain in lower back, pelvic region and hips, pain scale 2-3/10.  Pt with min A for all bed mobilty, transfer and short duration gait training.  Pt continues to demonstrate weakness with increased time to with bed mobility and transfers, shakiness due to LE weakness and overall limited by fatigue.  No reports of increased pain through session.  EOS pt left in chair with call bell within reach, chair alarm set and respiratory therapist in room at end of session.    Follow Up Recommendations  SNF     Equipment Recommendations  None recommended by PT    Recommendations for Other Services       Precautions / Restrictions Precautions Precautions: Fall Restrictions Weight Bearing Restrictions: No    Mobility  Bed Mobility Overal bed mobility: Needs Assistance Bed Mobility: Supine to Sit     Supine to sit: Min assist     General bed mobility comments: increased time and  cueing for handplacement with handrails for supine to sit. improved mechanics iwht scooting to EOB  Transfers Overall transfer level: Needs assistance Equipment used: Rolling walker (2 wheeled) Transfers: Sit to/from [facility] Sit to Stand: Min assist Stand pivot transfers: Min assist       General transfer comment: SPT to [facility].  Slow labored unsteady movements wiht occassial jerking during transfer to [facility].  Ambulation/Gait Ambulation/Gait assistance: Mod assist Gait Distance (Feet): 5 Feet Assistive device: 1 person hand held assist Gait Pattern/deviations: Decreased step length - right;Decreased step length - left;Decreased stride length     General Gait Details: limited to 5-6 slow unsteady steps at bedside due to poor standing balance and c/o nausea   Stairs             Wheelchair Mobility    Modified Rankin (Stroke Patients Only)       Balance                                            Cognition Arousal/Alertness: Awake/alert Behavior During Therapy: WFL for tasks assessed/performed Overall Cognitive Status: Within Functional Limits for tasks assessed                                        Exercises      General Comments        Pertinent Vitals/Pain Pain  Score: 3  Pain Location: LBP and Bil hips Pain Descriptors / Indicators: Aching;Sore Pain Intervention(s): Monitored during session;Limited activity within patient's tolerance;Repositioned;Heat applied    Home Living                      Prior Function            PT Goals (current goals can now be found in the care plan section)      Frequency    Min 3X/week      PT Plan Current plan remains appropriate    Co-evaluation              AM-PAC PT "6 Clicks" Mobility   Outcome Measure  Help needed turning from your back to your side while in a flat bed without using bedrails?: A Lot Help needed moving from lying on  your back to sitting on the side of a flat bed without using bedrails?: A Lot Help needed moving to and from a bed to a chair (including a wheelchair)?: A Lot Help needed standing up from a chair using your arms (e.g., wheelchair or bedside chair)?: A Lot Help needed to walk in hospital room?: A Lot Help needed climbing 3-5 steps with a railing? : A Lot 6 Click Score: 12    End of Session Equipment Utilized During Treatment: Gait belt;Oxygen(2L O2A) Activity Tolerance: Patient tolerated treatment well;Patient limited by fatigue Patient left: in chair;with call bell/phone within reach;with chair alarm set;with nursing/sitter in room(Respiratory therapist in room following PT session) Nurse Communication: Mobility status PT Visit Diagnosis: Unsteadiness on feet (R26.81);Other abnormalities of gait and mobility (R26.89);Muscle weakness (generalized) (M62.81)     Time: 1828-8337 PT Time Calculation (min) (ACUTE ONLY): 37 min  Charges:  $Therapeutic Exercise: 8-22 mins $Therapeutic Activity: 8-22 mins                     [address], LPTA; CBIS [phone]   Aldona Lento 11/21/2018, 1:13 PM

## 2018-11-21 NOTE — Progress Notes (Signed)
Husband discussed with Mrs. Braaten possibility of affording 24/7 care at home and they cannot afford it per sitter, [place].  Chelsea stated that his children help pay for the sitters they have now.

## 2018-11-21 NOTE — Care Management (Signed)
Patient will transfer to hospice facility tomorrow. [facility] Hospice will contact family and make arrangement for signing of paperwork. Bedside RN and attending notified.

## 2018-11-21 NOTE — Progress Notes (Addendum)
Palliative progress note:   Patient in bed awake, on the phone with her spouse. She is upset about going to residential Hospice home. Gave patient emotional support as she spoke with her spouse and home caregiver- Chelsea. Per [name] has been in touch with Hospice and they have completed paperwork for patient's transfer to residential hospice.  Discussed with patient - her preference is to return home and be with her husband. We discussed the lack of safe care for her at her home. Patient feels she can adequately care for herself. We discussed perhaps going to residential hospice while her spouse and caregivers continue to pursue 3rd shift care for her at home and transition her back home if that can be arranged. Patient's spouse is in agreement with this plan (spoke with him and caregiver Chelsea via cellphone at patient's bedside). Patient begrudgingly agrees, but, understandably is not happy with the situation.  [name] is concerned that if she goes to residential Hospice they will "dope me up". Explained purpose of medications interventions with hospice and noted that if she does not want or need pain or other medications they will not be given.  Patient's goals are to remain "clear of thought".   Assessment/Plan:  D/C to residential hospice- appears to already be in process per primary team Patient states she does have widespread pain due to hip fractures, but she declines any pain medications.   [name], AGNP-C Palliative Medicine  Please call Palliative Medicine team phone with any questions [phone]. For individual providers please see AMION.  Total time: 50 minutes Prolong time billed: yes

## 2018-11-21 NOTE — Care Management Important Message (Signed)
Important Message  Patient Details  Name: [name] MRN: [id number] Date of Birth: 12/15/1926   Medicare Important Message Given:  Yes    Tommy Medal 11/21/2018, 1:59 PM

## 2018-11-22 ENCOUNTER — Other Ambulatory Visit: Payer: Self-pay | Admitting: *Deleted

## 2018-11-22 LAB — CULTURE, BODY FLUID W GRAM STAIN -BOTTLE: Culture: NO GROWTH

## 2018-11-22 LAB — MRSA PCR SCREENING: MRSA by PCR: NEGATIVE

## 2018-11-22 NOTE — Progress Notes (Signed)
IV removed, 2x2 gauze and paper tape applied to site, patient tolerated well.. Assisted patient to get dressed. Report called to [facility], report given to [facility].  Sharley Hunnicutt, CM called EMS to transport patient to the facility.

## 2018-11-22 NOTE — Discharge Summary (Signed)
Physician Discharge Summary  [name] WNI:[id number] DOB: 17-Dec-1926 DOA: 11/14/2018  PCP: [name], MD  Admit date: 11/14/2018 Discharge date: 11/22/2018  Admitted From: HOME  Disposition: RESIDENTIAL HOSPICE  Recommendations   SYMPTOM MANAGEMENT PER HOSPICE PROTOCOL  Discharge Condition: HOSPICE   CODE STATUS: DNR    Brief Hospitalization Summary: Please see all hospital notes, images, labs for full details of the hospitalization.  Dr. Criss Rosales HPI: [name] is a 85 y.o. female with medical history significant for atrial fibrillation on Eliquis, chronic diastolic CHF, COPD with chronic hypoxic and hypercarbic respiratory failure, anxiety, and hypothyroidism, now presenting to emergency department for evaluation of progressive generalized weakness and fatigue.  Patient reports that she was recently discharged from SNF (on 11/02/2018) and has been experiencing some progressive fatigue and generalized weakness since that time.  She has also experienced insidious worsening in her chronic dyspnea.  She reports that the chronic cough is unchanged.  Denies any fevers or chills.  She denies any chest pain or palpitations.  She reports chronic bilateral lower extremity swelling but is uncertain if it is worse than usual at this time.  ED Course: Upon arrival to the ED, patient is found to be afebrile, saturating well on her usual 3 L/min of supplemental oxygen, and with remaining vitals normal.  EKG features atrial fibrillation with left axis deviation, similar to prior.  Chest x-ray notable for stable moderate left and small right pleural effusions with increased pulmonary edema and bibasilar consolidation that likely reflects atelectasis.  Chemistry panel is notable for potassium of 2.9 and bicarbonate of 41.  CBC notable for a mild chronic macrocytic anemia.  Lactic acid is reassuringly normal.  Urinalysis unremarkable.  Troponin slightly elevated at 0.03 and BNP is elevated at  578.  Patient was treated with 40 mEq oral potassium and 40 mg IV Lasix in the ED.  She will be observed for ongoing evaluation and management of acute on chronic diastolic CHF.   Brief Admission Hx: 85 year old female with advanced COPD, chronic diastolic congestive heart failure, hypothyroidism presented with progressive shortness of breath, weakness and appears to be at the end of life.  MDM/Assessment & Plan:   1. Acute on chronic diastolic congestive heart failure-patient received maximal medical treatment and has had no significant meaningful improvement.  She is now awaiting for residential hospice placement. 2. Advanced COPD- she is now at end stages of her disease and at end-of-life and waiting for hospice placement.  Will obtain covid 19 testing as protocol for hospice admission.  3. Chronic atrial fib - stable.  4. Hypothyroidism - levothyroxine.  5. Severe protein calorie malnutrition - liberate diet for comfort care orders.    DVT prophylaxis: apixaban Code Status: DNR  Family Communication: husband / caretakers Disposition Plan: prognosis less than 3 weeks, residential hospice  Consultants:  Palliative medicine   Procedures:  thoracentesis  Antimicrobials:  n/a  Discharge Diagnoses:  Principal Problem:   Acute on chronic diastolic CHF (congestive heart failure) ([place]) Active Problems:   COPD (chronic obstructive pulmonary disease) (HCC)   Hypothyroidism   AF (paroxysmal atrial fibrillation) (HCC)   Coronary artery disease of native artery of native heart with stable angina pectoris (HCC)   Chronic anxiety   Chronic respiratory failure with hypoxia and hypercapnia (HCC)   Hypokalemia   Palliative care by specialist   Goals of care, counseling/discussion   Advanced care planning/counseling discussion   Discharge Instructions:  Allergies as of 11/22/2018  Reactions   Shrimp [shellfish Allergy] Nausea And Vomiting, Swelling, Other (See Comments)    Made her run a fever also   Fish Allergy Nausea And Vomiting, Swelling, Other (See Comments)   Extremely sick   Adhesive [tape] Other (See Comments)   Tape BRUISES and TEARS THE SKIN; please use an alternative!!   Other Other (See Comments)   Does not remember which "mycin" drug = yeast infection No seeds or nuts = Digestive isues      Medication List    STOP taking these medications   acetaminophen 500 MG tablet Commonly known as:  TYLENOL   albuterol 108 (90 Base) MCG/ACT inhaler Commonly known as:  VENTOLIN HFA   clonazePAM 0.5 MG tablet Commonly known as:  KLONOPIN   diltiazem 360 MG 24 hr capsule Commonly known as:  CARDIZEM CD   Eliquis 2.5 MG Tabs tablet Generic drug:  apixaban   feeding supplement (ENSURE ENLIVE) Liqd   folic acid 734 MCG tablet Commonly known as:  FOLVITE   furosemide 20 MG tablet Commonly known as:  LASIX   gabapentin 100 MG capsule Commonly known as:  NEURONTIN   guaiFENesin 600 MG 12 hr tablet Commonly known as:  MUCINEX   hydrocortisone 25 MG suppository Commonly known as:  ANUSOL-HC   ICaps Areds 2 Caps   ipratropium-albuterol 0.5-2.5 (3) MG/3ML Soln Commonly known as:  DUONEB   levothyroxine 75 MCG tablet Commonly known as:  SYNTHROID   magnesium hydroxide 400 MG/5ML suspension Commonly known as:  MILK OF MAGNESIA   nitroGLYCERIN 0.4 MG SL tablet Commonly known as:  NITROSTAT   ondansetron 4 MG tablet Commonly known as:  ZOFRAN   OXYGEN   Proctozone-HC 2.5 % rectal cream Generic drug:  hydrocortisone   sodium chloride HYPERTONIC 3 % nebulizer solution   Systane 0.4-0.3 % Soln Generic drug:  Polyethyl Glycol-Propyl Glycol   vitamin A & D ointment   vitamin B-12 1000 MCG tablet Commonly known as:  [place] Follow up.   Contact information: [address] [place] [address] [phone]          Allergies  Allergen Reactions  . Shrimp  [Shellfish Allergy] Nausea And Vomiting, Swelling and Other (See Comments)    Made her run a fever also  . Fish Allergy Nausea And Vomiting, Swelling and Other (See Comments)    Extremely sick  . Adhesive [Tape] Other (See Comments)    Tape BRUISES and TEARS THE SKIN; please use an alternative!!  . Other Other (See Comments)    Does not remember which "mycin" drug = yeast infection No seeds or nuts = Digestive isues   Allergies as of 11/22/2018      Reactions   Shrimp [shellfish Allergy] Nausea And Vomiting, Swelling, Other (See Comments)   Made her run a fever also   Fish Allergy Nausea And Vomiting, Swelling, Other (See Comments)   Extremely sick   Adhesive [tape] Other (See Comments)   Tape BRUISES and TEARS THE SKIN; please use an alternative!!   Other Other (See Comments)   Does not remember which "mycin" drug = yeast infection No seeds or nuts = Digestive isues      Medication List    STOP taking these medications   acetaminophen 500 MG tablet Commonly known as:  TYLENOL   albuterol 108 (90 Base) MCG/ACT inhaler Commonly known as:  VENTOLIN HFA   clonazePAM 0.5 MG tablet  Commonly known as:  KLONOPIN   diltiazem 360 MG 24 hr capsule Commonly known as:  CARDIZEM CD   Eliquis 2.5 MG Tabs tablet Generic drug:  apixaban   feeding supplement (ENSURE ENLIVE) Liqd   folic acid 409 MCG tablet Commonly known as:  FOLVITE   furosemide 20 MG tablet Commonly known as:  LASIX   gabapentin 100 MG capsule Commonly known as:  NEURONTIN   guaiFENesin 600 MG 12 hr tablet Commonly known as:  MUCINEX   hydrocortisone 25 MG suppository Commonly known as:  ANUSOL-HC   ICaps Areds 2 Caps   ipratropium-albuterol 0.5-2.5 (3) MG/3ML Soln Commonly known as:  DUONEB   levothyroxine 75 MCG tablet Commonly known as:  SYNTHROID   magnesium hydroxide 400 MG/5ML suspension Commonly known as:  MILK OF MAGNESIA   nitroGLYCERIN 0.4 MG SL tablet Commonly known as:  NITROSTAT    ondansetron 4 MG tablet Commonly known as:  ZOFRAN   OXYGEN   Proctozone-HC 2.5 % rectal cream Generic drug:  hydrocortisone   sodium chloride HYPERTONIC 3 % nebulizer solution   Systane 0.4-0.3 % Soln Generic drug:  Polyethyl Glycol-Propyl Glycol   vitamin A & D ointment   vitamin B-12 1000 MCG tablet Commonly known as:  CYANOCOBALAMIN       Procedures/Studies: Dg Chest 1 View  Result Date: 11/17/2018 CLINICAL DATA:  85 year old female with a history of left thoracentesis EXAM: CHEST  1 VIEW COMPARISON:  11/16/2018 FINDINGS: Cardiomediastinal silhouette unchanged in size and contour. Improved opacity at the left lung base with blunting of left costophrenic angle. No pneumothorax. Opacity at the right lung base persists with blunting of the right costophrenic angle and cardiophrenic angle. Reticular opacities of the lungs persist, with improving aeration. IMPRESSION: Improved left pleural effusion, with no pneumothorax. Persisting opacity at the right lung base potentially combination of pleural fluid, atelectasis/consolidation. Decreasing interstitial edema. Electronically Signed   By: Corrie Mckusick D.O.   On: 11/17/2018 12:40   Dg Chest Port 1 View  Result Date: 11/16/2018 CLINICAL DATA:  Increased weakness.  Productive cough. EXAM: PORTABLE CHEST 1 VIEW COMPARISON:  November 14, 2018 FINDINGS: Bilateral pleural effusions, left greater than right, remain. Diffuse interstitial pulmonary opacities are similar, likely pulmonary edema. No other interval changes. IMPRESSION: 1. Bilateral pleural effusions, left greater than right, with underlying opacities are stable. The underlying opacities may simply represent compressive atelectasis. 2. Continued pulmonary edema. Electronically Signed   By: Dorise Bullion III M.D   On: 11/16/2018 08:37   Dg Chest Port 1 View  Result Date: 11/14/2018 CLINICAL DATA:  85 y/o F; increasing weakness and productive cough as well as shortness of breath.  EXAM: PORTABLE CHEST 1 VIEW COMPARISON:  10/15/2018 chest radiograph. FINDINGS: Stable cardiomegaly given projection and technique. Stable moderate left and small right pleural effusions. Diffuse hazy airspace opacities. Bibasilar consolidation. Bones are demineralized. No acute osseous abnormality is evident. IMPRESSION: Stable moderate left and small right pleural effusions. Increased pulmonary edema. Bibasilar consolidation, likely associated atelectasis, however, pneumonia is possible. Electronically Signed   By: Kristine Garbe M.D.   On: 11/14/2018 19:33   US Thoracentesis Asp Pleural Space W/img Guide  Result Date: 11/17/2018 INDICATION: 85 year old female with a history of pleural effusion. EXAM: ULTRASOUND GUIDED LEFT THORACENTESIS MEDICATIONS: None. COMPLICATIONS: None PROCEDURE: An ultrasound guided thoracentesis was thoroughly discussed with the patient and questions answered. The benefits, risks, alternatives and complications were also discussed. The patient understands and wishes to proceed with the procedure. Written consent was  obtained. Ultrasound was performed to localize and mark an adequate pocket of fluid in the left chest. The area was then prepped and draped in the normal sterile fashion. 1% Lidocaine was used for local anesthesia. Under ultrasound guidance a 19 gauge, 7-cm, Yueh catheter was introduced. Thoracentesis was performed. The catheter was removed and a dressing applied. FINDINGS: A total of approximately 330 cc of serosanguineous fluid was removed. Samples were sent to the laboratory as requested by the clinical team. IMPRESSION: Status post ultrasound-guided left-sided thoracentesis. Signed, Dulcy Fanny. Dellia Nims, RPVI [facility] Electronically Signed   By: Corrie Mckusick D.O.   On: 11/17/2018 13:09      Subjective: Pt appears comfortable without distress.    Discharge Exam: Vitals:   11/22/18 0619  11/22/18 0730  BP: 112/68   Pulse: 85   Resp: 16   Temp: (!) 97.5 F (36.4 C)   SpO2: 100% 99%   Vitals:   11/21/18 2223 11/22/18 0500 11/22/18 0619 11/22/18 0730  BP: 108/82  112/68   Pulse: (!) 105  85   Resp:   16   Temp: 98 F (36.7 C)  (!) 97.5 F (36.4 C)   TempSrc: Oral  Oral   SpO2: 99%  100% 99%  Weight:  46.5 kg 45.3 kg   Height:        General exam: emaciate female lying in bed mostly unresponsive, mouth breathing.  Respiratory system: diffuse wheezing and some rales heard.  Cardiovascular system: S1 & S2 heard. No pedal edema. Gastrointestinal system: Abdomen is nondistended, soft and nontender. Normal bowel sounds heard. Central nervous system: lethargic.  Extremities: no clubbing or edema.   The results of significant diagnostics from this hospitalization (including imaging, microbiology, ancillary and laboratory) are listed below for reference.     Microbiology: Recent Results (from the past 240 hour(s))  Urine culture     Status: Abnormal   Collection Time: 11/14/18  6:05 PM  Result Value Ref Range Status   Specimen Description   Final    URINE, RANDOM Performed at [facility], [address], [place], [place] [address]    Special Requests   Final    NONE Performed at [facility], [address] [place], [place] [address]    Culture (A)  Final    <10,000 COLONIES/mL INSIGNIFICANT GROWTH Performed at [place] [address], [place], [place] [address]    Report Status 11/16/2018 FINAL  Final  Culture, body fluid-bottle     Status: None   Collection Time: 11/17/18 12:15 PM  Result Value Ref Range Status   Specimen Description PLEURAL  Final   Special Requests BOTTLES DRAWN AEROBIC AND ANAEROBIC 10 CC EACH  Final   Culture   Final    NO GROWTH 5 DAYS Performed at [facility], [address], [place], [place] [address]    Report Status 11/22/2018 FINAL  Final  Gram stain     Status: None   Collection Time: 11/17/18 12:15 PM  Result  Value Ref Range Status   Specimen Description PLEURAL  Final   Special Requests NONE  Final   Gram Stain   Final    CYTOSPIN SMEAR WBC PRESENT, PREDOMINANTLY PMN FEW MONONUCLEAR WBC PRESENT NO ORGANISMS SEEN Performed at [facility], [address] [place], [place] [address]    Report Status 11/17/2018 FINAL  Final  SARS Coronavirus 2 [address] order, Performed in [facility] hospital lab)  Status: None   Collection Time: 11/21/18 10:47 AM  Result Value Ref Range Status   SARS Coronavirus 2 NEGATIVE NEGATIVE Final    Comment: (NOTE) If result is NEGATIVE SARS-CoV-2 target nucleic acids are NOT DETECTED. The SARS-CoV-2 RNA is generally detectable in upper and lower  respiratory specimens during the acute phase of infection. The lowest  concentration of SARS-CoV-2 viral copies this assay can detect is 250  copies / mL. A negative result does not preclude SARS-CoV-2 infection  and should not be used as the sole basis for treatment or other  patient management decisions.  A negative result may occur with  improper specimen collection / handling, submission of specimen other  than nasopharyngeal swab, presence of viral mutation(s) within the  areas targeted by this assay, and inadequate number of viral copies  (<250 copies / mL). A negative result must be combined with clinical  observations, patient history, and epidemiological information. If result is POSITIVE SARS-CoV-2 target nucleic acids are DETECTED. The SARS-CoV-2 RNA is generally detectable in upper and lower  respiratory specimens dur ing the acute phase of infection.  Positive  results are indicative of active infection with SARS-CoV-2.  Clinical  correlation with patient history and other diagnostic information is  necessary to determine patient infection status.  Positive results do  not rule out bacterial infection or co-infection with other viruses. If result is PRESUMPTIVE POSTIVE SARS-CoV-2 nucleic acids MAY BE  PRESENT.   A presumptive positive result was obtained on the submitted specimen  and confirmed on repeat testing.  While 2019 novel coronavirus  (SARS-CoV-2) nucleic acids may be present in the submitted sample  additional confirmatory testing may be necessary for epidemiological  and / or clinical management purposes  to differentiate between  SARS-CoV-2 and other Sarbecovirus currently known to infect humans.  If clinically indicated additional testing with an alternate test  methodology [phone]) is advised. The SARS-CoV-2 RNA is generally  detectable in upper and lower respiratory sp ecimens during the acute  phase of infection. The expected result is Negative. Fact Sheet for Patients:  StrictlyIdeas.no Fact Sheet for Healthcare Providers: BankingDealers.co.za This test is not yet approved or cleared by the [place] FDA and has been authorized for detection and/or diagnosis of SARS-CoV-2 by FDA under an Emergency Use Authorization (EUA).  This EUA will remain in effect (meaning this test can be used) for the duration of the COVID-19 declaration under Section 564(b)(1) of the Act, 21 U.S.C. section 360bbb-3(b)(1), unless the authorization is terminated or revoked sooner. Performed at [facility], [address], [place], [place] [address]      Labs: BNP (last 3 results) Recent Labs    03/29/18 1612 10/11/18 2106 11/14/18 1805  BNP 604.9* 390.0* 614.4*   Basic Metabolic Panel: Recent Labs  Lab 11/16/18 0442 11/17/18 0454 11/18/18 0432 11/19/18 0636  NA 139 137 137 139  K 3.8 3.7 3.7 4.1  CL 80* 78* 83* 89*  CO2 49* 50* 45* 42*  GLUCOSE 121* 109* 88 103*  BUN 22 24* 19 17  CREATININE 0.71 0.70 0.79 0.70  CALCIUM 8.9 8.8* 8.3* 8.6*  MG 2.1  --   --   --    Liver Function Tests: No results for input(s): AST, ALT, ALKPHOS, BILITOT, PROT, ALBUMIN in the last 168 hours. No results for input(s): LIPASE, AMYLASE in  the last 168 hours. No results for input(s): AMMONIA in the last 168 hours. CBC: Recent Labs  Lab 11/17/18 0454 11/20/18 0524 11/21/18  0455  WBC 7.5 7.2 8.4  HGB 11.1* 12.2 13.1  HCT 36.2 39.8 42.7  MCV 102.0* 101.5* 100.2*  PLT 184 174 184   Cardiac Enzymes: No results for input(s): CKTOTAL, CKMB, CKMBINDEX, TROPONINI in the last 168 hours. BNP: Invalid input(s): POCBNP CBG: Recent Labs  Lab 11/16/18 0726 11/16/18 2149  GLUCAP 121* 80   D-Dimer No results for input(s): DDIMER in the last 72 hours. Hgb A1c No results for input(s): HGBA1C in the last 72 hours. Lipid Profile No results for input(s): CHOL, HDL, LDLCALC, TRIG, CHOLHDL, LDLDIRECT in the last 72 hours. Thyroid function studies No results for input(s): TSH, T4TOTAL, T3FREE, THYROIDAB in the last 72 hours.  Invalid input(s): FREET3 Anemia work up No results for input(s): VITAMINB12, FOLATE, FERRITIN, TIBC, IRON, RETICCTPCT in the last 72 hours. Urinalysis    Component Value Date/Time   COLORURINE STRAW (A) 11/14/2018 1805   APPEARANCEUR CLEAR 11/14/2018 1805   LABSPEC 1.005 11/14/2018 1805   PHURINE 7.0 11/14/2018 1805   GLUCOSEU NEGATIVE 11/14/2018 1805   HGBUR NEGATIVE 11/14/2018 1805   BILIRUBINUR NEGATIVE 11/14/2018 1805   KETONESUR NEGATIVE 11/14/2018 1805   PROTEINUR NEGATIVE 11/14/2018 1805   UROBILINOGEN 0.2 09/21/2014 1325   NITRITE NEGATIVE 11/14/2018 1805   LEUKOCYTESUR NEGATIVE 11/14/2018 1805   Sepsis Labs Invalid input(s): PROCALCITONIN,  WBC,  LACTICIDVEN Microbiology Recent Results (from the past 240 hour(s))  Urine culture     Status: Abnormal   Collection Time: 11/14/18  6:05 PM  Result Value Ref Range Status   Specimen Description   Final    URINE, RANDOM Performed at [facility], [address], [place], [place] [address]    Special Requests   Final    NONE Performed at [facility], [address], [place], [place] [address]    Culture (A)  Final    <10,000 COLONIES/mL  INSIGNIFICANT GROWTH Performed at [facility], [place] [address] [place], [place] [address]    Report Status 11/16/2018 FINAL  Final  Culture, body fluid-bottle     Status: None   Collection Time: 11/17/18 12:15 PM  Result Value Ref Range Status   Specimen Description PLEURAL  Final   Special Requests BOTTLES DRAWN AEROBIC AND ANAEROBIC 10 CC EACH  Final   Culture   Final    NO GROWTH 5 DAYS Performed at [facility], [address] [place], [place] [address]    Report Status 11/22/2018 FINAL  Final  Gram stain     Status: None   Collection Time: 11/17/18 12:15 PM  Result Value Ref Range Status   Specimen Description PLEURAL  Final   Special Requests NONE  Final   Gram Stain   Final    CYTOSPIN SMEAR WBC PRESENT, PREDOMINANTLY PMN FEW MONONUCLEAR WBC PRESENT NO ORGANISMS SEEN Performed at [facility], [address] [place], [place] [address]    Report Status 11/17/2018 FINAL  Final  SARS Coronavirus 2 [facility] order, Performed in [place] hospital lab)     Status: None   Collection Time: 11/21/18 10:47 AM  Result Value Ref Range Status   SARS Coronavirus 2 NEGATIVE NEGATIVE Final    Comment: (NOTE) If result is NEGATIVE SARS-CoV-2 target nucleic acids are NOT DETECTED. The SARS-CoV-2 RNA is generally detectable in upper and lower  respiratory specimens during the acute phase of infection. The lowest  concentration of SARS-CoV-2 viral copies this assay can detect is 250  copies / mL. A negative result does not preclude SARS-CoV-2  infection  and should not be used as the sole basis for treatment or other  patient management decisions.  A negative result may occur with  improper specimen collection / handling, submission of specimen other  than nasopharyngeal swab, presence of viral mutation(s) within the  areas targeted by this assay, and inadequate number of viral copies  (<250 copies / mL). A negative result must be combined with clinical  observations, patient  history, and epidemiological information. If result is POSITIVE SARS-CoV-2 target nucleic acids are DETECTED. The SARS-CoV-2 RNA is generally detectable in upper and lower  respiratory specimens dur ing the acute phase of infection.  Positive  results are indicative of active infection with SARS-CoV-2.  Clinical  correlation with patient history and other diagnostic information is  necessary to determine patient infection status.  Positive results do  not rule out bacterial infection or co-infection with other viruses. If result is PRESUMPTIVE POSTIVE SARS-CoV-2 nucleic acids MAY BE PRESENT.   A presumptive positive result was obtained on the submitted specimen  and confirmed on repeat testing.  While 2019 novel coronavirus  (SARS-CoV-2) nucleic acids may be present in the submitted sample  additional confirmatory testing may be necessary for epidemiological  and / or clinical management purposes  to differentiate between  SARS-CoV-2 and other Sarbecovirus currently known to infect humans.  If clinically indicated additional testing with an alternate test  methodology [phone]) is advised. The SARS-CoV-2 RNA is generally  detectable in upper and lower respiratory sp ecimens during the acute  phase of infection. The expected result is Negative. Fact Sheet for Patients:  StrictlyIdeas.no Fact Sheet for Healthcare Providers: BankingDealers.co.za This test is not yet approved or cleared by the [place] FDA and has been authorized for detection and/or diagnosis of SARS-CoV-2 by FDA under an Emergency Use Authorization (EUA).  This EUA will remain in effect (meaning this test can be used) for the duration of the COVID-19 declaration under Section 564(b)(1) of the Act, 21 U.S.C. section 360bbb-3(b)(1), unless the authorization is terminated or revoked sooner. Performed at [facility], [address] [place], [place] [address]     Time  coordinating discharge: 31 mins  SIGNED:  Irwin Brakeman, MD  [facility] 11/22/2018, 7:42 AM How to contact the [facility] Attending or Consulting provider [place] or covering provider during after hours [place], for this patient?  1. Check the care team in [facility] and look for a) attending/consulting [facility] provider listed and b) the [place] team listed 2. Log into [email or web address] and use 's universal password to access. If you do not have the password, please contact the hospital operator. 3. Locate the [facility] provider you are looking for under [facility] and page to a number that you can be directly reached. 4. If you still have difficulty reaching the provider, please page the [facility] (Director on Call) for the Hospitalists listed on amion for assistance.

## 2018-11-22 NOTE — Patient Outreach (Signed)
Pt discharged from hospital 11/22/18 and transferred to hospice home in [employer].  RN CM faxed primary care MD case closure letter and mailed case closure letter to pt home.  Case closed  [name] [facility], [place] Coordinator [phone]

## 2018-11-22 NOTE — TOC Transition Note (Addendum)
Transition of Care [facility]) - CM/SW Discharge Note   Patient Details  Name: [name] MRN: [id number] Date of Birth: 30-Jun-1927  Transition of Care [facility]) CM/SW Contact:  Maxximus Gotay, Chauncey Reading, RN Phone Number: 11/22/2018, 9:52 AM   Clinical Narrative:   Patient reluctant to go to hospice home today. Long discussion had with patient's care giver Vikki Ports in regards to patient going to hospice facility vs coming home.  Chelsea has care lined up 8 am to 11 pm. Patient would be at home alone with husband at night. Per [facility] patient does not take her medications and does not wear her oxygen continuously. Husband can not care for patient alone at night. Discussed with Vikki Ports that patient is alert and oriented and that if she demands to come home, we will have to allow this decision. Hospice has accepted patient and will follow her at home if she goes home.   Patient has no family other than her husband, they have been married 2 years. Patient's husband [name]) has 4 children in that live in [place]. [name] has dementia and falls frequently at home per [facility]. Per [facility], patient is not eligible for Medicaid. Barron Schmid and Donald's children want patient to go to hospice facility. Attending feels patient has 3 weeks or less.   Met with patient at bedside. She is upset. Comforted patietn and offered supportive listening. Reiterated to patient that she has the right to make her own decisions and will we honor her wishes.   She is agreeable to go to hospice facility, stating she does not want to lose her bed there.   Offered to have Chaplain come see patient, left VM. Bedside RN dressing patient and calling report to hospice.  EMS transport arranged.   Notified Chelsea that patient will transfer today. Vikki Ports will be taking [name] to see patient at the hospice home today when she arrives. Family can visit 3 times a day for 30 minutes each.       Barriers to Discharge: No Barriers  Identified   Patient Goals and CMS Choice Patient states their goals for this hospitalization and ongoing recovery are:: patient wants to go home, unsure if she will agree to SNF CMS Medicare.gov Compare Post Acute Care list provided to:: Patient Choice offered to / list presented to : Patient     Discharge Plan and Services In-house Referral: Hospice / Palliative Care Discharge Planning Services: CM Consult Post Acute Care Choice: Hospice                [facility] Agency: [facility]   Social Determinants of Health (SDOH) Interventions     Readmission Risk Interventions Readmission Risk Prevention Plan 11/18/2018 11/17/2018 10/20/2018  Transportation Screening - Complete Complete  PCP or Specialist Appt within 3-5 Days - - Complete  Home Care Screening - - -  Medication Review (RN CM) - - -  [place] or [place] - - Complete  Social Work Consult for [place] Planning/Counseling - - Complete  Palliative Care Screening - - Not Applicable  Medication Review Press [employer]) - Complete Complete  PCP or Specialist appointment within 3-5 days of discharge Not Complete - -  PCP/Specialist Appt Not Complete comments patient going home with hospice - -  [place] or [place] - Complete -  SW Recovery Care/Counseling Consult - Complete -  [place] Not Applicable - -  Some recent data might be hidden

## 2018-11-22 NOTE — Care Management (Signed)
Visited patient, EMS here for transport. Offered emotional support and prayers.

## 2019-01-24 ENCOUNTER — Other Ambulatory Visit (HOSPITAL_COMMUNITY)
Admission: RE | Admit: 2019-01-24 | Discharge: 2019-01-24 | Disposition: A | Discharge status: Home / Self Care | Source: Other Acute Inpatient Hospital | Attending: Adult Health Nurse Practitioner | Admitting: Adult Health Nurse Practitioner

## 2019-01-24 DIAGNOSIS — R300 Dysuria: Secondary | ICD-10-CM | POA: Diagnosis present

## 2019-01-24 LAB — URINALYSIS, COMPLETE (UACMP) WITH MICROSCOPIC
Bilirubin Urine: NEGATIVE
Glucose, UA: NEGATIVE mg/dL
Hgb urine dipstick: NEGATIVE
Ketones, ur: NEGATIVE mg/dL
Nitrite: NEGATIVE
Protein, ur: 30 mg/dL — AB
Specific Gravity, Urine: 1.016 (ref 1.005–1.030)
WBC, UA: >50 WBC/hpf — ABNORMAL HIGH (ref 0–5)
pH: 6 (ref 5.0–8.0)

## 2019-01-26 LAB — URINE CULTURE: Culture: 30000 — AB

## 2019-02-08 ENCOUNTER — Encounter (INDEPENDENT_AMBULATORY_CARE_PROVIDER_SITE_OTHER): Payer: Medicare Other | Admitting: Ophthalmology

## 2019-02-16 ENCOUNTER — Encounter (INDEPENDENT_AMBULATORY_CARE_PROVIDER_SITE_OTHER): Admitting: Ophthalmology

## 2019-03-04 DIAGNOSIS — R0600 Dyspnea, unspecified: Secondary | ICD-10-CM | POA: Diagnosis not present

## 2019-03-04 DIAGNOSIS — E43 Unspecified severe protein-calorie malnutrition: Secondary | ICD-10-CM | POA: Diagnosis not present

## 2019-03-04 DIAGNOSIS — I4891 Unspecified atrial fibrillation: Secondary | ICD-10-CM | POA: Diagnosis not present

## 2019-03-04 DIAGNOSIS — J918 Pleural effusion in other conditions classified elsewhere: Secondary | ICD-10-CM | POA: Diagnosis not present

## 2019-03-04 DIAGNOSIS — J449 Chronic obstructive pulmonary disease, unspecified: Secondary | ICD-10-CM | POA: Diagnosis not present

## 2019-03-04 DIAGNOSIS — R531 Weakness: Secondary | ICD-10-CM | POA: Diagnosis not present

## 2019-03-04 DIAGNOSIS — R0602 Shortness of breath: Secondary | ICD-10-CM | POA: Diagnosis not present

## 2019-03-04 DIAGNOSIS — I5032 Chronic diastolic (congestive) heart failure: Secondary | ICD-10-CM | POA: Diagnosis not present

## 2019-03-04 DIAGNOSIS — J9611 Chronic respiratory failure with hypoxia: Secondary | ICD-10-CM | POA: Diagnosis not present

## 2019-03-04 DIAGNOSIS — I5033 Acute on chronic diastolic (congestive) heart failure: Secondary | ICD-10-CM | POA: Diagnosis not present

## 2019-03-04 DIAGNOSIS — Z9981 Dependence on supplemental oxygen: Secondary | ICD-10-CM | POA: Diagnosis not present

## 2019-03-04 DIAGNOSIS — E039 Hypothyroidism, unspecified: Secondary | ICD-10-CM | POA: Diagnosis not present

## 2019-03-07 DIAGNOSIS — J9611 Chronic respiratory failure with hypoxia: Secondary | ICD-10-CM | POA: Diagnosis not present

## 2019-03-07 DIAGNOSIS — I5033 Acute on chronic diastolic (congestive) heart failure: Secondary | ICD-10-CM | POA: Diagnosis not present

## 2019-03-07 DIAGNOSIS — E039 Hypothyroidism, unspecified: Secondary | ICD-10-CM | POA: Diagnosis not present

## 2019-03-07 DIAGNOSIS — E43 Unspecified severe protein-calorie malnutrition: Secondary | ICD-10-CM | POA: Diagnosis not present

## 2019-03-07 DIAGNOSIS — R531 Weakness: Secondary | ICD-10-CM | POA: Diagnosis not present

## 2019-03-07 DIAGNOSIS — J449 Chronic obstructive pulmonary disease, unspecified: Secondary | ICD-10-CM | POA: Diagnosis not present

## 2019-03-08 DIAGNOSIS — E43 Unspecified severe protein-calorie malnutrition: Secondary | ICD-10-CM | POA: Diagnosis not present

## 2019-03-08 DIAGNOSIS — E039 Hypothyroidism, unspecified: Secondary | ICD-10-CM | POA: Diagnosis not present

## 2019-03-08 DIAGNOSIS — J9611 Chronic respiratory failure with hypoxia: Secondary | ICD-10-CM | POA: Diagnosis not present

## 2019-03-08 DIAGNOSIS — I5033 Acute on chronic diastolic (congestive) heart failure: Secondary | ICD-10-CM | POA: Diagnosis not present

## 2019-03-08 DIAGNOSIS — R531 Weakness: Secondary | ICD-10-CM | POA: Diagnosis not present

## 2019-03-08 DIAGNOSIS — J449 Chronic obstructive pulmonary disease, unspecified: Secondary | ICD-10-CM | POA: Diagnosis not present

## 2019-03-10 DIAGNOSIS — E43 Unspecified severe protein-calorie malnutrition: Secondary | ICD-10-CM | POA: Diagnosis not present

## 2019-03-10 DIAGNOSIS — I5033 Acute on chronic diastolic (congestive) heart failure: Secondary | ICD-10-CM | POA: Diagnosis not present

## 2019-03-10 DIAGNOSIS — J9611 Chronic respiratory failure with hypoxia: Secondary | ICD-10-CM | POA: Diagnosis not present

## 2019-03-10 DIAGNOSIS — R531 Weakness: Secondary | ICD-10-CM | POA: Diagnosis not present

## 2019-03-10 DIAGNOSIS — J449 Chronic obstructive pulmonary disease, unspecified: Secondary | ICD-10-CM | POA: Diagnosis not present

## 2019-03-10 DIAGNOSIS — E039 Hypothyroidism, unspecified: Secondary | ICD-10-CM | POA: Diagnosis not present

## 2019-03-13 DIAGNOSIS — E43 Unspecified severe protein-calorie malnutrition: Secondary | ICD-10-CM | POA: Diagnosis not present

## 2019-03-13 DIAGNOSIS — R531 Weakness: Secondary | ICD-10-CM | POA: Diagnosis not present

## 2019-03-13 DIAGNOSIS — I5033 Acute on chronic diastolic (congestive) heart failure: Secondary | ICD-10-CM | POA: Diagnosis not present

## 2019-03-13 DIAGNOSIS — J9611 Chronic respiratory failure with hypoxia: Secondary | ICD-10-CM | POA: Diagnosis not present

## 2019-03-13 DIAGNOSIS — J449 Chronic obstructive pulmonary disease, unspecified: Secondary | ICD-10-CM | POA: Diagnosis not present

## 2019-03-13 DIAGNOSIS — E039 Hypothyroidism, unspecified: Secondary | ICD-10-CM | POA: Diagnosis not present

## 2019-03-14 DIAGNOSIS — E43 Unspecified severe protein-calorie malnutrition: Secondary | ICD-10-CM | POA: Diagnosis not present

## 2019-03-14 DIAGNOSIS — E039 Hypothyroidism, unspecified: Secondary | ICD-10-CM | POA: Diagnosis not present

## 2019-03-14 DIAGNOSIS — J9611 Chronic respiratory failure with hypoxia: Secondary | ICD-10-CM | POA: Diagnosis not present

## 2019-03-14 DIAGNOSIS — R531 Weakness: Secondary | ICD-10-CM | POA: Diagnosis not present

## 2019-03-14 DIAGNOSIS — J449 Chronic obstructive pulmonary disease, unspecified: Secondary | ICD-10-CM | POA: Diagnosis not present

## 2019-03-14 DIAGNOSIS — I5033 Acute on chronic diastolic (congestive) heart failure: Secondary | ICD-10-CM | POA: Diagnosis not present

## 2019-03-15 DIAGNOSIS — J9611 Chronic respiratory failure with hypoxia: Secondary | ICD-10-CM | POA: Diagnosis not present

## 2019-03-15 DIAGNOSIS — E039 Hypothyroidism, unspecified: Secondary | ICD-10-CM | POA: Diagnosis not present

## 2019-03-15 DIAGNOSIS — R531 Weakness: Secondary | ICD-10-CM | POA: Diagnosis not present

## 2019-03-15 DIAGNOSIS — E43 Unspecified severe protein-calorie malnutrition: Secondary | ICD-10-CM | POA: Diagnosis not present

## 2019-03-15 DIAGNOSIS — I5033 Acute on chronic diastolic (congestive) heart failure: Secondary | ICD-10-CM | POA: Diagnosis not present

## 2019-03-15 DIAGNOSIS — J449 Chronic obstructive pulmonary disease, unspecified: Secondary | ICD-10-CM | POA: Diagnosis not present

## 2019-03-16 DIAGNOSIS — E039 Hypothyroidism, unspecified: Secondary | ICD-10-CM | POA: Diagnosis not present

## 2019-03-16 DIAGNOSIS — J9611 Chronic respiratory failure with hypoxia: Secondary | ICD-10-CM | POA: Diagnosis not present

## 2019-03-16 DIAGNOSIS — E43 Unspecified severe protein-calorie malnutrition: Secondary | ICD-10-CM | POA: Diagnosis not present

## 2019-03-16 DIAGNOSIS — J449 Chronic obstructive pulmonary disease, unspecified: Secondary | ICD-10-CM | POA: Diagnosis not present

## 2019-03-16 DIAGNOSIS — I5033 Acute on chronic diastolic (congestive) heart failure: Secondary | ICD-10-CM | POA: Diagnosis not present

## 2019-03-16 DIAGNOSIS — R531 Weakness: Secondary | ICD-10-CM | POA: Diagnosis not present

## 2019-03-17 DIAGNOSIS — J9611 Chronic respiratory failure with hypoxia: Secondary | ICD-10-CM | POA: Diagnosis not present

## 2019-03-17 DIAGNOSIS — E43 Unspecified severe protein-calorie malnutrition: Secondary | ICD-10-CM | POA: Diagnosis not present

## 2019-03-17 DIAGNOSIS — I5033 Acute on chronic diastolic (congestive) heart failure: Secondary | ICD-10-CM | POA: Diagnosis not present

## 2019-03-17 DIAGNOSIS — J449 Chronic obstructive pulmonary disease, unspecified: Secondary | ICD-10-CM | POA: Diagnosis not present

## 2019-03-17 DIAGNOSIS — E039 Hypothyroidism, unspecified: Secondary | ICD-10-CM | POA: Diagnosis not present

## 2019-03-17 DIAGNOSIS — R531 Weakness: Secondary | ICD-10-CM | POA: Diagnosis not present

## 2019-03-20 DIAGNOSIS — E43 Unspecified severe protein-calorie malnutrition: Secondary | ICD-10-CM | POA: Diagnosis not present

## 2019-03-20 DIAGNOSIS — J9611 Chronic respiratory failure with hypoxia: Secondary | ICD-10-CM | POA: Diagnosis not present

## 2019-03-20 DIAGNOSIS — R531 Weakness: Secondary | ICD-10-CM | POA: Diagnosis not present

## 2019-03-20 DIAGNOSIS — I5033 Acute on chronic diastolic (congestive) heart failure: Secondary | ICD-10-CM | POA: Diagnosis not present

## 2019-03-20 DIAGNOSIS — E039 Hypothyroidism, unspecified: Secondary | ICD-10-CM | POA: Diagnosis not present

## 2019-03-20 DIAGNOSIS — J449 Chronic obstructive pulmonary disease, unspecified: Secondary | ICD-10-CM | POA: Diagnosis not present

## 2019-03-21 DIAGNOSIS — J9611 Chronic respiratory failure with hypoxia: Secondary | ICD-10-CM | POA: Diagnosis not present

## 2019-03-21 DIAGNOSIS — J449 Chronic obstructive pulmonary disease, unspecified: Secondary | ICD-10-CM | POA: Diagnosis not present

## 2019-03-21 DIAGNOSIS — E039 Hypothyroidism, unspecified: Secondary | ICD-10-CM | POA: Diagnosis not present

## 2019-03-21 DIAGNOSIS — E43 Unspecified severe protein-calorie malnutrition: Secondary | ICD-10-CM | POA: Diagnosis not present

## 2019-03-21 DIAGNOSIS — R531 Weakness: Secondary | ICD-10-CM | POA: Diagnosis not present

## 2019-03-21 DIAGNOSIS — I5033 Acute on chronic diastolic (congestive) heart failure: Secondary | ICD-10-CM | POA: Diagnosis not present

## 2019-03-22 DIAGNOSIS — I5033 Acute on chronic diastolic (congestive) heart failure: Secondary | ICD-10-CM | POA: Diagnosis not present

## 2019-03-22 DIAGNOSIS — E43 Unspecified severe protein-calorie malnutrition: Secondary | ICD-10-CM | POA: Diagnosis not present

## 2019-03-22 DIAGNOSIS — J9611 Chronic respiratory failure with hypoxia: Secondary | ICD-10-CM | POA: Diagnosis not present

## 2019-03-22 DIAGNOSIS — R531 Weakness: Secondary | ICD-10-CM | POA: Diagnosis not present

## 2019-03-22 DIAGNOSIS — E039 Hypothyroidism, unspecified: Secondary | ICD-10-CM | POA: Diagnosis not present

## 2019-03-22 DIAGNOSIS — J449 Chronic obstructive pulmonary disease, unspecified: Secondary | ICD-10-CM | POA: Diagnosis not present

## 2019-03-23 DIAGNOSIS — J449 Chronic obstructive pulmonary disease, unspecified: Secondary | ICD-10-CM | POA: Diagnosis not present

## 2019-03-23 DIAGNOSIS — R531 Weakness: Secondary | ICD-10-CM | POA: Diagnosis not present

## 2019-03-23 DIAGNOSIS — J9611 Chronic respiratory failure with hypoxia: Secondary | ICD-10-CM | POA: Diagnosis not present

## 2019-03-23 DIAGNOSIS — I5033 Acute on chronic diastolic (congestive) heart failure: Secondary | ICD-10-CM | POA: Diagnosis not present

## 2019-03-23 DIAGNOSIS — E43 Unspecified severe protein-calorie malnutrition: Secondary | ICD-10-CM | POA: Diagnosis not present

## 2019-03-23 DIAGNOSIS — E039 Hypothyroidism, unspecified: Secondary | ICD-10-CM | POA: Diagnosis not present

## 2019-03-24 DIAGNOSIS — I5033 Acute on chronic diastolic (congestive) heart failure: Secondary | ICD-10-CM | POA: Diagnosis not present

## 2019-03-24 DIAGNOSIS — J9611 Chronic respiratory failure with hypoxia: Secondary | ICD-10-CM | POA: Diagnosis not present

## 2019-03-24 DIAGNOSIS — R531 Weakness: Secondary | ICD-10-CM | POA: Diagnosis not present

## 2019-03-24 DIAGNOSIS — J449 Chronic obstructive pulmonary disease, unspecified: Secondary | ICD-10-CM | POA: Diagnosis not present

## 2019-03-24 DIAGNOSIS — E039 Hypothyroidism, unspecified: Secondary | ICD-10-CM | POA: Diagnosis not present

## 2019-03-24 DIAGNOSIS — E43 Unspecified severe protein-calorie malnutrition: Secondary | ICD-10-CM | POA: Diagnosis not present

## 2019-03-27 DIAGNOSIS — R531 Weakness: Secondary | ICD-10-CM | POA: Diagnosis not present

## 2019-03-27 DIAGNOSIS — E039 Hypothyroidism, unspecified: Secondary | ICD-10-CM | POA: Diagnosis not present

## 2019-03-27 DIAGNOSIS — E43 Unspecified severe protein-calorie malnutrition: Secondary | ICD-10-CM | POA: Diagnosis not present

## 2019-03-27 DIAGNOSIS — J449 Chronic obstructive pulmonary disease, unspecified: Secondary | ICD-10-CM | POA: Diagnosis not present

## 2019-03-27 DIAGNOSIS — I5033 Acute on chronic diastolic (congestive) heart failure: Secondary | ICD-10-CM | POA: Diagnosis not present

## 2019-03-27 DIAGNOSIS — J9611 Chronic respiratory failure with hypoxia: Secondary | ICD-10-CM | POA: Diagnosis not present

## 2019-03-28 DIAGNOSIS — R531 Weakness: Secondary | ICD-10-CM | POA: Diagnosis not present

## 2019-03-28 DIAGNOSIS — E43 Unspecified severe protein-calorie malnutrition: Secondary | ICD-10-CM | POA: Diagnosis not present

## 2019-03-28 DIAGNOSIS — I5033 Acute on chronic diastolic (congestive) heart failure: Secondary | ICD-10-CM | POA: Diagnosis not present

## 2019-03-28 DIAGNOSIS — J449 Chronic obstructive pulmonary disease, unspecified: Secondary | ICD-10-CM | POA: Diagnosis not present

## 2019-03-28 DIAGNOSIS — E039 Hypothyroidism, unspecified: Secondary | ICD-10-CM | POA: Diagnosis not present

## 2019-03-28 DIAGNOSIS — J9611 Chronic respiratory failure with hypoxia: Secondary | ICD-10-CM | POA: Diagnosis not present

## 2019-03-29 DIAGNOSIS — I5033 Acute on chronic diastolic (congestive) heart failure: Secondary | ICD-10-CM | POA: Diagnosis not present

## 2019-03-29 DIAGNOSIS — R531 Weakness: Secondary | ICD-10-CM | POA: Diagnosis not present

## 2019-03-29 DIAGNOSIS — J449 Chronic obstructive pulmonary disease, unspecified: Secondary | ICD-10-CM | POA: Diagnosis not present

## 2019-03-29 DIAGNOSIS — J9611 Chronic respiratory failure with hypoxia: Secondary | ICD-10-CM | POA: Diagnosis not present

## 2019-03-29 DIAGNOSIS — E039 Hypothyroidism, unspecified: Secondary | ICD-10-CM | POA: Diagnosis not present

## 2019-03-29 DIAGNOSIS — E43 Unspecified severe protein-calorie malnutrition: Secondary | ICD-10-CM | POA: Diagnosis not present

## 2019-03-31 DIAGNOSIS — E039 Hypothyroidism, unspecified: Secondary | ICD-10-CM | POA: Diagnosis not present

## 2019-03-31 DIAGNOSIS — R531 Weakness: Secondary | ICD-10-CM | POA: Diagnosis not present

## 2019-03-31 DIAGNOSIS — I5033 Acute on chronic diastolic (congestive) heart failure: Secondary | ICD-10-CM | POA: Diagnosis not present

## 2019-03-31 DIAGNOSIS — J449 Chronic obstructive pulmonary disease, unspecified: Secondary | ICD-10-CM | POA: Diagnosis not present

## 2019-03-31 DIAGNOSIS — E43 Unspecified severe protein-calorie malnutrition: Secondary | ICD-10-CM | POA: Diagnosis not present

## 2019-03-31 DIAGNOSIS — J9611 Chronic respiratory failure with hypoxia: Secondary | ICD-10-CM | POA: Diagnosis not present

## 2019-04-03 DIAGNOSIS — R531 Weakness: Secondary | ICD-10-CM | POA: Diagnosis not present

## 2019-04-03 DIAGNOSIS — E43 Unspecified severe protein-calorie malnutrition: Secondary | ICD-10-CM | POA: Diagnosis not present

## 2019-04-03 DIAGNOSIS — E039 Hypothyroidism, unspecified: Secondary | ICD-10-CM | POA: Diagnosis not present

## 2019-04-03 DIAGNOSIS — I5033 Acute on chronic diastolic (congestive) heart failure: Secondary | ICD-10-CM | POA: Diagnosis not present

## 2019-04-03 DIAGNOSIS — J9611 Chronic respiratory failure with hypoxia: Secondary | ICD-10-CM | POA: Diagnosis not present

## 2019-04-03 DIAGNOSIS — J449 Chronic obstructive pulmonary disease, unspecified: Secondary | ICD-10-CM | POA: Diagnosis not present

## 2019-04-04 DIAGNOSIS — E43 Unspecified severe protein-calorie malnutrition: Secondary | ICD-10-CM | POA: Diagnosis not present

## 2019-04-04 DIAGNOSIS — I4891 Unspecified atrial fibrillation: Secondary | ICD-10-CM | POA: Diagnosis not present

## 2019-04-04 DIAGNOSIS — J9611 Chronic respiratory failure with hypoxia: Secondary | ICD-10-CM | POA: Diagnosis not present

## 2019-04-04 DIAGNOSIS — Z9981 Dependence on supplemental oxygen: Secondary | ICD-10-CM | POA: Diagnosis not present

## 2019-04-04 DIAGNOSIS — R531 Weakness: Secondary | ICD-10-CM | POA: Diagnosis not present

## 2019-04-04 DIAGNOSIS — E039 Hypothyroidism, unspecified: Secondary | ICD-10-CM | POA: Diagnosis not present

## 2019-04-04 DIAGNOSIS — J918 Pleural effusion in other conditions classified elsewhere: Secondary | ICD-10-CM | POA: Diagnosis not present

## 2019-04-04 DIAGNOSIS — J449 Chronic obstructive pulmonary disease, unspecified: Secondary | ICD-10-CM | POA: Diagnosis not present

## 2019-04-04 DIAGNOSIS — R0602 Shortness of breath: Secondary | ICD-10-CM | POA: Diagnosis not present

## 2019-04-04 DIAGNOSIS — R0600 Dyspnea, unspecified: Secondary | ICD-10-CM | POA: Diagnosis not present

## 2019-04-04 DIAGNOSIS — I5032 Chronic diastolic (congestive) heart failure: Secondary | ICD-10-CM | POA: Diagnosis not present

## 2019-04-04 DIAGNOSIS — I5033 Acute on chronic diastolic (congestive) heart failure: Secondary | ICD-10-CM | POA: Diagnosis not present

## 2019-04-05 DIAGNOSIS — E039 Hypothyroidism, unspecified: Secondary | ICD-10-CM | POA: Diagnosis not present

## 2019-04-05 DIAGNOSIS — J449 Chronic obstructive pulmonary disease, unspecified: Secondary | ICD-10-CM | POA: Diagnosis not present

## 2019-04-05 DIAGNOSIS — R531 Weakness: Secondary | ICD-10-CM | POA: Diagnosis not present

## 2019-04-05 DIAGNOSIS — E43 Unspecified severe protein-calorie malnutrition: Secondary | ICD-10-CM | POA: Diagnosis not present

## 2019-04-05 DIAGNOSIS — J9611 Chronic respiratory failure with hypoxia: Secondary | ICD-10-CM | POA: Diagnosis not present

## 2019-04-05 DIAGNOSIS — I5033 Acute on chronic diastolic (congestive) heart failure: Secondary | ICD-10-CM | POA: Diagnosis not present

## 2019-04-07 DIAGNOSIS — I5033 Acute on chronic diastolic (congestive) heart failure: Secondary | ICD-10-CM | POA: Diagnosis not present

## 2019-04-07 DIAGNOSIS — E039 Hypothyroidism, unspecified: Secondary | ICD-10-CM | POA: Diagnosis not present

## 2019-04-07 DIAGNOSIS — R531 Weakness: Secondary | ICD-10-CM | POA: Diagnosis not present

## 2019-04-07 DIAGNOSIS — J449 Chronic obstructive pulmonary disease, unspecified: Secondary | ICD-10-CM | POA: Diagnosis not present

## 2019-04-07 DIAGNOSIS — E43 Unspecified severe protein-calorie malnutrition: Secondary | ICD-10-CM | POA: Diagnosis not present

## 2019-04-07 DIAGNOSIS — J9611 Chronic respiratory failure with hypoxia: Secondary | ICD-10-CM | POA: Diagnosis not present

## 2019-04-11 DIAGNOSIS — E43 Unspecified severe protein-calorie malnutrition: Secondary | ICD-10-CM | POA: Diagnosis not present

## 2019-04-11 DIAGNOSIS — E039 Hypothyroidism, unspecified: Secondary | ICD-10-CM | POA: Diagnosis not present

## 2019-04-11 DIAGNOSIS — I5033 Acute on chronic diastolic (congestive) heart failure: Secondary | ICD-10-CM | POA: Diagnosis not present

## 2019-04-11 DIAGNOSIS — J9611 Chronic respiratory failure with hypoxia: Secondary | ICD-10-CM | POA: Diagnosis not present

## 2019-04-11 DIAGNOSIS — R531 Weakness: Secondary | ICD-10-CM | POA: Diagnosis not present

## 2019-04-11 DIAGNOSIS — J449 Chronic obstructive pulmonary disease, unspecified: Secondary | ICD-10-CM | POA: Diagnosis not present

## 2019-04-12 DIAGNOSIS — E43 Unspecified severe protein-calorie malnutrition: Secondary | ICD-10-CM | POA: Diagnosis not present

## 2019-04-12 DIAGNOSIS — E039 Hypothyroidism, unspecified: Secondary | ICD-10-CM | POA: Diagnosis not present

## 2019-04-12 DIAGNOSIS — R531 Weakness: Secondary | ICD-10-CM | POA: Diagnosis not present

## 2019-04-12 DIAGNOSIS — J9611 Chronic respiratory failure with hypoxia: Secondary | ICD-10-CM | POA: Diagnosis not present

## 2019-04-12 DIAGNOSIS — I5033 Acute on chronic diastolic (congestive) heart failure: Secondary | ICD-10-CM | POA: Diagnosis not present

## 2019-04-12 DIAGNOSIS — J449 Chronic obstructive pulmonary disease, unspecified: Secondary | ICD-10-CM | POA: Diagnosis not present

## 2019-04-13 DIAGNOSIS — R531 Weakness: Secondary | ICD-10-CM | POA: Diagnosis not present

## 2019-04-13 DIAGNOSIS — E43 Unspecified severe protein-calorie malnutrition: Secondary | ICD-10-CM | POA: Diagnosis not present

## 2019-04-13 DIAGNOSIS — E039 Hypothyroidism, unspecified: Secondary | ICD-10-CM | POA: Diagnosis not present

## 2019-04-13 DIAGNOSIS — I5033 Acute on chronic diastolic (congestive) heart failure: Secondary | ICD-10-CM | POA: Diagnosis not present

## 2019-04-13 DIAGNOSIS — J9611 Chronic respiratory failure with hypoxia: Secondary | ICD-10-CM | POA: Diagnosis not present

## 2019-04-13 DIAGNOSIS — J449 Chronic obstructive pulmonary disease, unspecified: Secondary | ICD-10-CM | POA: Diagnosis not present

## 2019-04-14 DIAGNOSIS — I5033 Acute on chronic diastolic (congestive) heart failure: Secondary | ICD-10-CM | POA: Diagnosis not present

## 2019-04-14 DIAGNOSIS — J9611 Chronic respiratory failure with hypoxia: Secondary | ICD-10-CM | POA: Diagnosis not present

## 2019-04-14 DIAGNOSIS — J449 Chronic obstructive pulmonary disease, unspecified: Secondary | ICD-10-CM | POA: Diagnosis not present

## 2019-04-14 DIAGNOSIS — E039 Hypothyroidism, unspecified: Secondary | ICD-10-CM | POA: Diagnosis not present

## 2019-04-14 DIAGNOSIS — E43 Unspecified severe protein-calorie malnutrition: Secondary | ICD-10-CM | POA: Diagnosis not present

## 2019-04-14 DIAGNOSIS — R531 Weakness: Secondary | ICD-10-CM | POA: Diagnosis not present

## 2019-04-17 DIAGNOSIS — E039 Hypothyroidism, unspecified: Secondary | ICD-10-CM | POA: Diagnosis not present

## 2019-04-17 DIAGNOSIS — J449 Chronic obstructive pulmonary disease, unspecified: Secondary | ICD-10-CM | POA: Diagnosis not present

## 2019-04-17 DIAGNOSIS — I5033 Acute on chronic diastolic (congestive) heart failure: Secondary | ICD-10-CM | POA: Diagnosis not present

## 2019-04-17 DIAGNOSIS — J9611 Chronic respiratory failure with hypoxia: Secondary | ICD-10-CM | POA: Diagnosis not present

## 2019-04-17 DIAGNOSIS — R531 Weakness: Secondary | ICD-10-CM | POA: Diagnosis not present

## 2019-04-17 DIAGNOSIS — E43 Unspecified severe protein-calorie malnutrition: Secondary | ICD-10-CM | POA: Diagnosis not present

## 2019-04-19 ENCOUNTER — Telehealth: Payer: Self-pay | Admitting: Emergency Medicine

## 2019-04-19 ENCOUNTER — Other Ambulatory Visit: Payer: Self-pay | Admitting: Cardiology

## 2019-04-19 DIAGNOSIS — R531 Weakness: Secondary | ICD-10-CM | POA: Diagnosis not present

## 2019-04-19 DIAGNOSIS — E039 Hypothyroidism, unspecified: Secondary | ICD-10-CM | POA: Diagnosis not present

## 2019-04-19 DIAGNOSIS — I5033 Acute on chronic diastolic (congestive) heart failure: Secondary | ICD-10-CM | POA: Diagnosis not present

## 2019-04-19 DIAGNOSIS — J9611 Chronic respiratory failure with hypoxia: Secondary | ICD-10-CM | POA: Diagnosis not present

## 2019-04-19 DIAGNOSIS — J449 Chronic obstructive pulmonary disease, unspecified: Secondary | ICD-10-CM | POA: Diagnosis not present

## 2019-04-19 DIAGNOSIS — E43 Unspecified severe protein-calorie malnutrition: Secondary | ICD-10-CM | POA: Diagnosis not present

## 2019-04-19 NOTE — Telephone Encounter (Signed)
Please schedule overdue 3 month F/U with Dr. Agustin Cree. Thank you!

## 2019-04-19 NOTE — Telephone Encounter (Signed)
Called patient and asked about refill for lasix. She said hospice already refilled for her. She is overdue for her follow up. I offered her a appointment she declined and wants to wait until after the pandemic. No further questions.

## 2019-04-21 DIAGNOSIS — E43 Unspecified severe protein-calorie malnutrition: Secondary | ICD-10-CM | POA: Diagnosis not present

## 2019-04-21 DIAGNOSIS — J9611 Chronic respiratory failure with hypoxia: Secondary | ICD-10-CM | POA: Diagnosis not present

## 2019-04-21 DIAGNOSIS — J449 Chronic obstructive pulmonary disease, unspecified: Secondary | ICD-10-CM | POA: Diagnosis not present

## 2019-04-21 DIAGNOSIS — R531 Weakness: Secondary | ICD-10-CM | POA: Diagnosis not present

## 2019-04-21 DIAGNOSIS — I5033 Acute on chronic diastolic (congestive) heart failure: Secondary | ICD-10-CM | POA: Diagnosis not present

## 2019-04-21 DIAGNOSIS — E039 Hypothyroidism, unspecified: Secondary | ICD-10-CM | POA: Diagnosis not present

## 2019-04-24 DIAGNOSIS — R531 Weakness: Secondary | ICD-10-CM | POA: Diagnosis not present

## 2019-04-24 DIAGNOSIS — J449 Chronic obstructive pulmonary disease, unspecified: Secondary | ICD-10-CM | POA: Diagnosis not present

## 2019-04-24 DIAGNOSIS — E43 Unspecified severe protein-calorie malnutrition: Secondary | ICD-10-CM | POA: Diagnosis not present

## 2019-04-24 DIAGNOSIS — E039 Hypothyroidism, unspecified: Secondary | ICD-10-CM | POA: Diagnosis not present

## 2019-04-24 DIAGNOSIS — I5033 Acute on chronic diastolic (congestive) heart failure: Secondary | ICD-10-CM | POA: Diagnosis not present

## 2019-04-24 DIAGNOSIS — J9611 Chronic respiratory failure with hypoxia: Secondary | ICD-10-CM | POA: Diagnosis not present

## 2019-04-25 DIAGNOSIS — E43 Unspecified severe protein-calorie malnutrition: Secondary | ICD-10-CM | POA: Diagnosis not present

## 2019-04-25 DIAGNOSIS — E039 Hypothyroidism, unspecified: Secondary | ICD-10-CM | POA: Diagnosis not present

## 2019-04-25 DIAGNOSIS — J449 Chronic obstructive pulmonary disease, unspecified: Secondary | ICD-10-CM | POA: Diagnosis not present

## 2019-04-25 DIAGNOSIS — I5033 Acute on chronic diastolic (congestive) heart failure: Secondary | ICD-10-CM | POA: Diagnosis not present

## 2019-04-25 DIAGNOSIS — R531 Weakness: Secondary | ICD-10-CM | POA: Diagnosis not present

## 2019-04-25 DIAGNOSIS — J9611 Chronic respiratory failure with hypoxia: Secondary | ICD-10-CM | POA: Diagnosis not present

## 2019-04-26 DIAGNOSIS — I5033 Acute on chronic diastolic (congestive) heart failure: Secondary | ICD-10-CM | POA: Diagnosis not present

## 2019-04-26 DIAGNOSIS — E039 Hypothyroidism, unspecified: Secondary | ICD-10-CM | POA: Diagnosis not present

## 2019-04-26 DIAGNOSIS — E43 Unspecified severe protein-calorie malnutrition: Secondary | ICD-10-CM | POA: Diagnosis not present

## 2019-04-26 DIAGNOSIS — J9611 Chronic respiratory failure with hypoxia: Secondary | ICD-10-CM | POA: Diagnosis not present

## 2019-04-26 DIAGNOSIS — J449 Chronic obstructive pulmonary disease, unspecified: Secondary | ICD-10-CM | POA: Diagnosis not present

## 2019-04-26 DIAGNOSIS — R531 Weakness: Secondary | ICD-10-CM | POA: Diagnosis not present

## 2019-04-28 DIAGNOSIS — I5033 Acute on chronic diastolic (congestive) heart failure: Secondary | ICD-10-CM | POA: Diagnosis not present

## 2019-04-28 DIAGNOSIS — E43 Unspecified severe protein-calorie malnutrition: Secondary | ICD-10-CM | POA: Diagnosis not present

## 2019-04-28 DIAGNOSIS — R531 Weakness: Secondary | ICD-10-CM | POA: Diagnosis not present

## 2019-04-28 DIAGNOSIS — J449 Chronic obstructive pulmonary disease, unspecified: Secondary | ICD-10-CM | POA: Diagnosis not present

## 2019-04-28 DIAGNOSIS — J9611 Chronic respiratory failure with hypoxia: Secondary | ICD-10-CM | POA: Diagnosis not present

## 2019-04-28 DIAGNOSIS — E039 Hypothyroidism, unspecified: Secondary | ICD-10-CM | POA: Diagnosis not present

## 2019-05-01 DIAGNOSIS — R531 Weakness: Secondary | ICD-10-CM | POA: Diagnosis not present

## 2019-05-01 DIAGNOSIS — J449 Chronic obstructive pulmonary disease, unspecified: Secondary | ICD-10-CM | POA: Diagnosis not present

## 2019-05-01 DIAGNOSIS — I5033 Acute on chronic diastolic (congestive) heart failure: Secondary | ICD-10-CM | POA: Diagnosis not present

## 2019-05-01 DIAGNOSIS — E039 Hypothyroidism, unspecified: Secondary | ICD-10-CM | POA: Diagnosis not present

## 2019-05-01 DIAGNOSIS — E43 Unspecified severe protein-calorie malnutrition: Secondary | ICD-10-CM | POA: Diagnosis not present

## 2019-05-01 DIAGNOSIS — J9611 Chronic respiratory failure with hypoxia: Secondary | ICD-10-CM | POA: Diagnosis not present

## 2019-05-02 DIAGNOSIS — J9611 Chronic respiratory failure with hypoxia: Secondary | ICD-10-CM | POA: Diagnosis not present

## 2019-05-02 DIAGNOSIS — I5033 Acute on chronic diastolic (congestive) heart failure: Secondary | ICD-10-CM | POA: Diagnosis not present

## 2019-05-02 DIAGNOSIS — E43 Unspecified severe protein-calorie malnutrition: Secondary | ICD-10-CM | POA: Diagnosis not present

## 2019-05-02 DIAGNOSIS — E039 Hypothyroidism, unspecified: Secondary | ICD-10-CM | POA: Diagnosis not present

## 2019-05-02 DIAGNOSIS — R531 Weakness: Secondary | ICD-10-CM | POA: Diagnosis not present

## 2019-05-02 DIAGNOSIS — J449 Chronic obstructive pulmonary disease, unspecified: Secondary | ICD-10-CM | POA: Diagnosis not present

## 2019-05-03 DIAGNOSIS — J9611 Chronic respiratory failure with hypoxia: Secondary | ICD-10-CM | POA: Diagnosis not present

## 2019-05-03 DIAGNOSIS — R531 Weakness: Secondary | ICD-10-CM | POA: Diagnosis not present

## 2019-05-03 DIAGNOSIS — J449 Chronic obstructive pulmonary disease, unspecified: Secondary | ICD-10-CM | POA: Diagnosis not present

## 2019-05-03 DIAGNOSIS — I5033 Acute on chronic diastolic (congestive) heart failure: Secondary | ICD-10-CM | POA: Diagnosis not present

## 2019-05-03 DIAGNOSIS — E43 Unspecified severe protein-calorie malnutrition: Secondary | ICD-10-CM | POA: Diagnosis not present

## 2019-05-03 DIAGNOSIS — E039 Hypothyroidism, unspecified: Secondary | ICD-10-CM | POA: Diagnosis not present

## 2019-05-04 DIAGNOSIS — J449 Chronic obstructive pulmonary disease, unspecified: Secondary | ICD-10-CM | POA: Diagnosis not present

## 2019-05-04 DIAGNOSIS — J9611 Chronic respiratory failure with hypoxia: Secondary | ICD-10-CM | POA: Diagnosis not present

## 2019-05-04 DIAGNOSIS — Z9981 Dependence on supplemental oxygen: Secondary | ICD-10-CM | POA: Diagnosis not present

## 2019-05-04 DIAGNOSIS — I4891 Unspecified atrial fibrillation: Secondary | ICD-10-CM | POA: Diagnosis not present

## 2019-05-04 DIAGNOSIS — I5033 Acute on chronic diastolic (congestive) heart failure: Secondary | ICD-10-CM | POA: Diagnosis not present

## 2019-05-04 DIAGNOSIS — R531 Weakness: Secondary | ICD-10-CM | POA: Diagnosis not present

## 2019-05-04 DIAGNOSIS — I5032 Chronic diastolic (congestive) heart failure: Secondary | ICD-10-CM | POA: Diagnosis not present

## 2019-05-04 DIAGNOSIS — R0600 Dyspnea, unspecified: Secondary | ICD-10-CM | POA: Diagnosis not present

## 2019-05-04 DIAGNOSIS — E43 Unspecified severe protein-calorie malnutrition: Secondary | ICD-10-CM | POA: Diagnosis not present

## 2019-05-04 DIAGNOSIS — J918 Pleural effusion in other conditions classified elsewhere: Secondary | ICD-10-CM | POA: Diagnosis not present

## 2019-05-04 DIAGNOSIS — R0602 Shortness of breath: Secondary | ICD-10-CM | POA: Diagnosis not present

## 2019-05-04 DIAGNOSIS — E039 Hypothyroidism, unspecified: Secondary | ICD-10-CM | POA: Diagnosis not present

## 2019-05-05 DIAGNOSIS — R531 Weakness: Secondary | ICD-10-CM | POA: Diagnosis not present

## 2019-05-05 DIAGNOSIS — I5033 Acute on chronic diastolic (congestive) heart failure: Secondary | ICD-10-CM | POA: Diagnosis not present

## 2019-05-05 DIAGNOSIS — J449 Chronic obstructive pulmonary disease, unspecified: Secondary | ICD-10-CM | POA: Diagnosis not present

## 2019-05-05 DIAGNOSIS — E039 Hypothyroidism, unspecified: Secondary | ICD-10-CM | POA: Diagnosis not present

## 2019-05-05 DIAGNOSIS — J9611 Chronic respiratory failure with hypoxia: Secondary | ICD-10-CM | POA: Diagnosis not present

## 2019-05-05 DIAGNOSIS — E43 Unspecified severe protein-calorie malnutrition: Secondary | ICD-10-CM | POA: Diagnosis not present

## 2019-05-08 DIAGNOSIS — J449 Chronic obstructive pulmonary disease, unspecified: Secondary | ICD-10-CM | POA: Diagnosis not present

## 2019-05-08 DIAGNOSIS — R531 Weakness: Secondary | ICD-10-CM | POA: Diagnosis not present

## 2019-05-08 DIAGNOSIS — E039 Hypothyroidism, unspecified: Secondary | ICD-10-CM | POA: Diagnosis not present

## 2019-05-08 DIAGNOSIS — I5033 Acute on chronic diastolic (congestive) heart failure: Secondary | ICD-10-CM | POA: Diagnosis not present

## 2019-05-08 DIAGNOSIS — J9611 Chronic respiratory failure with hypoxia: Secondary | ICD-10-CM | POA: Diagnosis not present

## 2019-05-08 DIAGNOSIS — E43 Unspecified severe protein-calorie malnutrition: Secondary | ICD-10-CM | POA: Diagnosis not present

## 2019-05-09 DIAGNOSIS — J9611 Chronic respiratory failure with hypoxia: Secondary | ICD-10-CM | POA: Diagnosis not present

## 2019-05-09 DIAGNOSIS — E43 Unspecified severe protein-calorie malnutrition: Secondary | ICD-10-CM | POA: Diagnosis not present

## 2019-05-09 DIAGNOSIS — J449 Chronic obstructive pulmonary disease, unspecified: Secondary | ICD-10-CM | POA: Diagnosis not present

## 2019-05-09 DIAGNOSIS — E039 Hypothyroidism, unspecified: Secondary | ICD-10-CM | POA: Diagnosis not present

## 2019-05-09 DIAGNOSIS — I5033 Acute on chronic diastolic (congestive) heart failure: Secondary | ICD-10-CM | POA: Diagnosis not present

## 2019-05-09 DIAGNOSIS — R531 Weakness: Secondary | ICD-10-CM | POA: Diagnosis not present

## 2019-05-10 DIAGNOSIS — E43 Unspecified severe protein-calorie malnutrition: Secondary | ICD-10-CM | POA: Diagnosis not present

## 2019-05-10 DIAGNOSIS — R531 Weakness: Secondary | ICD-10-CM | POA: Diagnosis not present

## 2019-05-10 DIAGNOSIS — J449 Chronic obstructive pulmonary disease, unspecified: Secondary | ICD-10-CM | POA: Diagnosis not present

## 2019-05-10 DIAGNOSIS — J9611 Chronic respiratory failure with hypoxia: Secondary | ICD-10-CM | POA: Diagnosis not present

## 2019-05-10 DIAGNOSIS — I5033 Acute on chronic diastolic (congestive) heart failure: Secondary | ICD-10-CM | POA: Diagnosis not present

## 2019-05-10 DIAGNOSIS — E039 Hypothyroidism, unspecified: Secondary | ICD-10-CM | POA: Diagnosis not present

## 2019-05-12 DIAGNOSIS — R531 Weakness: Secondary | ICD-10-CM | POA: Diagnosis not present

## 2019-05-12 DIAGNOSIS — E43 Unspecified severe protein-calorie malnutrition: Secondary | ICD-10-CM | POA: Diagnosis not present

## 2019-05-12 DIAGNOSIS — J9611 Chronic respiratory failure with hypoxia: Secondary | ICD-10-CM | POA: Diagnosis not present

## 2019-05-12 DIAGNOSIS — E039 Hypothyroidism, unspecified: Secondary | ICD-10-CM | POA: Diagnosis not present

## 2019-05-12 DIAGNOSIS — J449 Chronic obstructive pulmonary disease, unspecified: Secondary | ICD-10-CM | POA: Diagnosis not present

## 2019-05-12 DIAGNOSIS — I5033 Acute on chronic diastolic (congestive) heart failure: Secondary | ICD-10-CM | POA: Diagnosis not present

## 2019-05-15 DIAGNOSIS — E039 Hypothyroidism, unspecified: Secondary | ICD-10-CM | POA: Diagnosis not present

## 2019-05-15 DIAGNOSIS — E43 Unspecified severe protein-calorie malnutrition: Secondary | ICD-10-CM | POA: Diagnosis not present

## 2019-05-15 DIAGNOSIS — R531 Weakness: Secondary | ICD-10-CM | POA: Diagnosis not present

## 2019-05-15 DIAGNOSIS — Z23 Encounter for immunization: Secondary | ICD-10-CM | POA: Diagnosis not present

## 2019-05-15 DIAGNOSIS — J9611 Chronic respiratory failure with hypoxia: Secondary | ICD-10-CM | POA: Diagnosis not present

## 2019-05-15 DIAGNOSIS — J449 Chronic obstructive pulmonary disease, unspecified: Secondary | ICD-10-CM | POA: Diagnosis not present

## 2019-05-15 DIAGNOSIS — I5033 Acute on chronic diastolic (congestive) heart failure: Secondary | ICD-10-CM | POA: Diagnosis not present

## 2019-05-16 DIAGNOSIS — E039 Hypothyroidism, unspecified: Secondary | ICD-10-CM | POA: Diagnosis not present

## 2019-05-16 DIAGNOSIS — E43 Unspecified severe protein-calorie malnutrition: Secondary | ICD-10-CM | POA: Diagnosis not present

## 2019-05-16 DIAGNOSIS — J449 Chronic obstructive pulmonary disease, unspecified: Secondary | ICD-10-CM | POA: Diagnosis not present

## 2019-05-16 DIAGNOSIS — R531 Weakness: Secondary | ICD-10-CM | POA: Diagnosis not present

## 2019-05-16 DIAGNOSIS — I5033 Acute on chronic diastolic (congestive) heart failure: Secondary | ICD-10-CM | POA: Diagnosis not present

## 2019-05-16 DIAGNOSIS — J9611 Chronic respiratory failure with hypoxia: Secondary | ICD-10-CM | POA: Diagnosis not present

## 2019-05-17 DIAGNOSIS — E43 Unspecified severe protein-calorie malnutrition: Secondary | ICD-10-CM | POA: Diagnosis not present

## 2019-05-17 DIAGNOSIS — R531 Weakness: Secondary | ICD-10-CM | POA: Diagnosis not present

## 2019-05-17 DIAGNOSIS — I5033 Acute on chronic diastolic (congestive) heart failure: Secondary | ICD-10-CM | POA: Diagnosis not present

## 2019-05-17 DIAGNOSIS — E039 Hypothyroidism, unspecified: Secondary | ICD-10-CM | POA: Diagnosis not present

## 2019-05-17 DIAGNOSIS — J449 Chronic obstructive pulmonary disease, unspecified: Secondary | ICD-10-CM | POA: Diagnosis not present

## 2019-05-17 DIAGNOSIS — J9611 Chronic respiratory failure with hypoxia: Secondary | ICD-10-CM | POA: Diagnosis not present

## 2019-05-19 DIAGNOSIS — E039 Hypothyroidism, unspecified: Secondary | ICD-10-CM | POA: Diagnosis not present

## 2019-05-19 DIAGNOSIS — E43 Unspecified severe protein-calorie malnutrition: Secondary | ICD-10-CM | POA: Diagnosis not present

## 2019-05-19 DIAGNOSIS — I5033 Acute on chronic diastolic (congestive) heart failure: Secondary | ICD-10-CM | POA: Diagnosis not present

## 2019-05-19 DIAGNOSIS — J9611 Chronic respiratory failure with hypoxia: Secondary | ICD-10-CM | POA: Diagnosis not present

## 2019-05-19 DIAGNOSIS — R531 Weakness: Secondary | ICD-10-CM | POA: Diagnosis not present

## 2019-05-19 DIAGNOSIS — J449 Chronic obstructive pulmonary disease, unspecified: Secondary | ICD-10-CM | POA: Diagnosis not present

## 2019-05-22 DIAGNOSIS — J9611 Chronic respiratory failure with hypoxia: Secondary | ICD-10-CM | POA: Diagnosis not present

## 2019-05-22 DIAGNOSIS — E43 Unspecified severe protein-calorie malnutrition: Secondary | ICD-10-CM | POA: Diagnosis not present

## 2019-05-22 DIAGNOSIS — I5033 Acute on chronic diastolic (congestive) heart failure: Secondary | ICD-10-CM | POA: Diagnosis not present

## 2019-05-22 DIAGNOSIS — E039 Hypothyroidism, unspecified: Secondary | ICD-10-CM | POA: Diagnosis not present

## 2019-05-22 DIAGNOSIS — R531 Weakness: Secondary | ICD-10-CM | POA: Diagnosis not present

## 2019-05-22 DIAGNOSIS — J449 Chronic obstructive pulmonary disease, unspecified: Secondary | ICD-10-CM | POA: Diagnosis not present

## 2019-05-23 DIAGNOSIS — I5033 Acute on chronic diastolic (congestive) heart failure: Secondary | ICD-10-CM | POA: Diagnosis not present

## 2019-05-23 DIAGNOSIS — J9611 Chronic respiratory failure with hypoxia: Secondary | ICD-10-CM | POA: Diagnosis not present

## 2019-05-23 DIAGNOSIS — E43 Unspecified severe protein-calorie malnutrition: Secondary | ICD-10-CM | POA: Diagnosis not present

## 2019-05-23 DIAGNOSIS — E039 Hypothyroidism, unspecified: Secondary | ICD-10-CM | POA: Diagnosis not present

## 2019-05-23 DIAGNOSIS — J449 Chronic obstructive pulmonary disease, unspecified: Secondary | ICD-10-CM | POA: Diagnosis not present

## 2019-05-23 DIAGNOSIS — R531 Weakness: Secondary | ICD-10-CM | POA: Diagnosis not present

## 2019-05-24 DIAGNOSIS — J449 Chronic obstructive pulmonary disease, unspecified: Secondary | ICD-10-CM | POA: Diagnosis not present

## 2019-05-24 DIAGNOSIS — J9611 Chronic respiratory failure with hypoxia: Secondary | ICD-10-CM | POA: Diagnosis not present

## 2019-05-24 DIAGNOSIS — E43 Unspecified severe protein-calorie malnutrition: Secondary | ICD-10-CM | POA: Diagnosis not present

## 2019-05-24 DIAGNOSIS — E039 Hypothyroidism, unspecified: Secondary | ICD-10-CM | POA: Diagnosis not present

## 2019-05-24 DIAGNOSIS — I5033 Acute on chronic diastolic (congestive) heart failure: Secondary | ICD-10-CM | POA: Diagnosis not present

## 2019-05-24 DIAGNOSIS — R531 Weakness: Secondary | ICD-10-CM | POA: Diagnosis not present

## 2019-05-26 DIAGNOSIS — I5033 Acute on chronic diastolic (congestive) heart failure: Secondary | ICD-10-CM | POA: Diagnosis not present

## 2019-05-26 DIAGNOSIS — J449 Chronic obstructive pulmonary disease, unspecified: Secondary | ICD-10-CM | POA: Diagnosis not present

## 2019-05-26 DIAGNOSIS — R531 Weakness: Secondary | ICD-10-CM | POA: Diagnosis not present

## 2019-05-26 DIAGNOSIS — E43 Unspecified severe protein-calorie malnutrition: Secondary | ICD-10-CM | POA: Diagnosis not present

## 2019-05-26 DIAGNOSIS — J9611 Chronic respiratory failure with hypoxia: Secondary | ICD-10-CM | POA: Diagnosis not present

## 2019-05-26 DIAGNOSIS — E039 Hypothyroidism, unspecified: Secondary | ICD-10-CM | POA: Diagnosis not present

## 2019-05-29 DIAGNOSIS — J449 Chronic obstructive pulmonary disease, unspecified: Secondary | ICD-10-CM | POA: Diagnosis not present

## 2019-05-29 DIAGNOSIS — E43 Unspecified severe protein-calorie malnutrition: Secondary | ICD-10-CM | POA: Diagnosis not present

## 2019-05-29 DIAGNOSIS — I5033 Acute on chronic diastolic (congestive) heart failure: Secondary | ICD-10-CM | POA: Diagnosis not present

## 2019-05-29 DIAGNOSIS — E039 Hypothyroidism, unspecified: Secondary | ICD-10-CM | POA: Diagnosis not present

## 2019-05-29 DIAGNOSIS — R531 Weakness: Secondary | ICD-10-CM | POA: Diagnosis not present

## 2019-05-29 DIAGNOSIS — J9611 Chronic respiratory failure with hypoxia: Secondary | ICD-10-CM | POA: Diagnosis not present

## 2019-05-30 DIAGNOSIS — R531 Weakness: Secondary | ICD-10-CM | POA: Diagnosis not present

## 2019-05-30 DIAGNOSIS — E039 Hypothyroidism, unspecified: Secondary | ICD-10-CM | POA: Diagnosis not present

## 2019-05-30 DIAGNOSIS — J9611 Chronic respiratory failure with hypoxia: Secondary | ICD-10-CM | POA: Diagnosis not present

## 2019-05-30 DIAGNOSIS — E43 Unspecified severe protein-calorie malnutrition: Secondary | ICD-10-CM | POA: Diagnosis not present

## 2019-05-30 DIAGNOSIS — I5033 Acute on chronic diastolic (congestive) heart failure: Secondary | ICD-10-CM | POA: Diagnosis not present

## 2019-05-30 DIAGNOSIS — J449 Chronic obstructive pulmonary disease, unspecified: Secondary | ICD-10-CM | POA: Diagnosis not present

## 2019-05-31 DIAGNOSIS — J449 Chronic obstructive pulmonary disease, unspecified: Secondary | ICD-10-CM | POA: Diagnosis not present

## 2019-05-31 DIAGNOSIS — R531 Weakness: Secondary | ICD-10-CM | POA: Diagnosis not present

## 2019-05-31 DIAGNOSIS — E039 Hypothyroidism, unspecified: Secondary | ICD-10-CM | POA: Diagnosis not present

## 2019-05-31 DIAGNOSIS — I5033 Acute on chronic diastolic (congestive) heart failure: Secondary | ICD-10-CM | POA: Diagnosis not present

## 2019-05-31 DIAGNOSIS — J9611 Chronic respiratory failure with hypoxia: Secondary | ICD-10-CM | POA: Diagnosis not present

## 2019-05-31 DIAGNOSIS — E43 Unspecified severe protein-calorie malnutrition: Secondary | ICD-10-CM | POA: Diagnosis not present

## 2019-06-02 DIAGNOSIS — R531 Weakness: Secondary | ICD-10-CM | POA: Diagnosis not present

## 2019-06-02 DIAGNOSIS — E43 Unspecified severe protein-calorie malnutrition: Secondary | ICD-10-CM | POA: Diagnosis not present

## 2019-06-02 DIAGNOSIS — J9611 Chronic respiratory failure with hypoxia: Secondary | ICD-10-CM | POA: Diagnosis not present

## 2019-06-02 DIAGNOSIS — E039 Hypothyroidism, unspecified: Secondary | ICD-10-CM | POA: Diagnosis not present

## 2019-06-02 DIAGNOSIS — I5033 Acute on chronic diastolic (congestive) heart failure: Secondary | ICD-10-CM | POA: Diagnosis not present

## 2019-06-02 DIAGNOSIS — J449 Chronic obstructive pulmonary disease, unspecified: Secondary | ICD-10-CM | POA: Diagnosis not present

## 2019-06-04 DIAGNOSIS — I4891 Unspecified atrial fibrillation: Secondary | ICD-10-CM | POA: Diagnosis not present

## 2019-06-04 DIAGNOSIS — E43 Unspecified severe protein-calorie malnutrition: Secondary | ICD-10-CM | POA: Diagnosis not present

## 2019-06-04 DIAGNOSIS — R0602 Shortness of breath: Secondary | ICD-10-CM | POA: Diagnosis not present

## 2019-06-04 DIAGNOSIS — E039 Hypothyroidism, unspecified: Secondary | ICD-10-CM | POA: Diagnosis not present

## 2019-06-04 DIAGNOSIS — I5032 Chronic diastolic (congestive) heart failure: Secondary | ICD-10-CM | POA: Diagnosis not present

## 2019-06-04 DIAGNOSIS — R0600 Dyspnea, unspecified: Secondary | ICD-10-CM | POA: Diagnosis not present

## 2019-06-04 DIAGNOSIS — Z9981 Dependence on supplemental oxygen: Secondary | ICD-10-CM | POA: Diagnosis not present

## 2019-06-04 DIAGNOSIS — J918 Pleural effusion in other conditions classified elsewhere: Secondary | ICD-10-CM | POA: Diagnosis not present

## 2019-06-04 DIAGNOSIS — J449 Chronic obstructive pulmonary disease, unspecified: Secondary | ICD-10-CM | POA: Diagnosis not present

## 2019-06-04 DIAGNOSIS — J9611 Chronic respiratory failure with hypoxia: Secondary | ICD-10-CM | POA: Diagnosis not present

## 2019-06-04 DIAGNOSIS — I5033 Acute on chronic diastolic (congestive) heart failure: Secondary | ICD-10-CM | POA: Diagnosis not present

## 2019-06-04 DIAGNOSIS — R531 Weakness: Secondary | ICD-10-CM | POA: Diagnosis not present

## 2019-06-05 DIAGNOSIS — J449 Chronic obstructive pulmonary disease, unspecified: Secondary | ICD-10-CM | POA: Diagnosis not present

## 2019-06-05 DIAGNOSIS — I5033 Acute on chronic diastolic (congestive) heart failure: Secondary | ICD-10-CM | POA: Diagnosis not present

## 2019-06-05 DIAGNOSIS — J9611 Chronic respiratory failure with hypoxia: Secondary | ICD-10-CM | POA: Diagnosis not present

## 2019-06-05 DIAGNOSIS — E039 Hypothyroidism, unspecified: Secondary | ICD-10-CM | POA: Diagnosis not present

## 2019-06-05 DIAGNOSIS — R531 Weakness: Secondary | ICD-10-CM | POA: Diagnosis not present

## 2019-06-05 DIAGNOSIS — E43 Unspecified severe protein-calorie malnutrition: Secondary | ICD-10-CM | POA: Diagnosis not present

## 2019-06-06 DIAGNOSIS — E43 Unspecified severe protein-calorie malnutrition: Secondary | ICD-10-CM | POA: Diagnosis not present

## 2019-06-06 DIAGNOSIS — R531 Weakness: Secondary | ICD-10-CM | POA: Diagnosis not present

## 2019-06-06 DIAGNOSIS — J449 Chronic obstructive pulmonary disease, unspecified: Secondary | ICD-10-CM | POA: Diagnosis not present

## 2019-06-06 DIAGNOSIS — I5033 Acute on chronic diastolic (congestive) heart failure: Secondary | ICD-10-CM | POA: Diagnosis not present

## 2019-06-06 DIAGNOSIS — E039 Hypothyroidism, unspecified: Secondary | ICD-10-CM | POA: Diagnosis not present

## 2019-06-06 DIAGNOSIS — J9611 Chronic respiratory failure with hypoxia: Secondary | ICD-10-CM | POA: Diagnosis not present

## 2019-06-07 DIAGNOSIS — J449 Chronic obstructive pulmonary disease, unspecified: Secondary | ICD-10-CM | POA: Diagnosis not present

## 2019-06-07 DIAGNOSIS — R531 Weakness: Secondary | ICD-10-CM | POA: Diagnosis not present

## 2019-06-07 DIAGNOSIS — I5033 Acute on chronic diastolic (congestive) heart failure: Secondary | ICD-10-CM | POA: Diagnosis not present

## 2019-06-07 DIAGNOSIS — J9611 Chronic respiratory failure with hypoxia: Secondary | ICD-10-CM | POA: Diagnosis not present

## 2019-06-07 DIAGNOSIS — E43 Unspecified severe protein-calorie malnutrition: Secondary | ICD-10-CM | POA: Diagnosis not present

## 2019-06-07 DIAGNOSIS — E039 Hypothyroidism, unspecified: Secondary | ICD-10-CM | POA: Diagnosis not present

## 2019-06-09 DIAGNOSIS — E43 Unspecified severe protein-calorie malnutrition: Secondary | ICD-10-CM | POA: Diagnosis not present

## 2019-06-09 DIAGNOSIS — I5033 Acute on chronic diastolic (congestive) heart failure: Secondary | ICD-10-CM | POA: Diagnosis not present

## 2019-06-09 DIAGNOSIS — E039 Hypothyroidism, unspecified: Secondary | ICD-10-CM | POA: Diagnosis not present

## 2019-06-09 DIAGNOSIS — J449 Chronic obstructive pulmonary disease, unspecified: Secondary | ICD-10-CM | POA: Diagnosis not present

## 2019-06-09 DIAGNOSIS — J9611 Chronic respiratory failure with hypoxia: Secondary | ICD-10-CM | POA: Diagnosis not present

## 2019-06-09 DIAGNOSIS — R531 Weakness: Secondary | ICD-10-CM | POA: Diagnosis not present

## 2019-06-12 DIAGNOSIS — R531 Weakness: Secondary | ICD-10-CM | POA: Diagnosis not present

## 2019-06-12 DIAGNOSIS — J449 Chronic obstructive pulmonary disease, unspecified: Secondary | ICD-10-CM | POA: Diagnosis not present

## 2019-06-12 DIAGNOSIS — E039 Hypothyroidism, unspecified: Secondary | ICD-10-CM | POA: Diagnosis not present

## 2019-06-12 DIAGNOSIS — E43 Unspecified severe protein-calorie malnutrition: Secondary | ICD-10-CM | POA: Diagnosis not present

## 2019-06-12 DIAGNOSIS — J9611 Chronic respiratory failure with hypoxia: Secondary | ICD-10-CM | POA: Diagnosis not present

## 2019-06-12 DIAGNOSIS — I5033 Acute on chronic diastolic (congestive) heart failure: Secondary | ICD-10-CM | POA: Diagnosis not present

## 2019-06-13 DIAGNOSIS — J9611 Chronic respiratory failure with hypoxia: Secondary | ICD-10-CM | POA: Diagnosis not present

## 2019-06-13 DIAGNOSIS — I5033 Acute on chronic diastolic (congestive) heart failure: Secondary | ICD-10-CM | POA: Diagnosis not present

## 2019-06-13 DIAGNOSIS — E039 Hypothyroidism, unspecified: Secondary | ICD-10-CM | POA: Diagnosis not present

## 2019-06-13 DIAGNOSIS — R531 Weakness: Secondary | ICD-10-CM | POA: Diagnosis not present

## 2019-06-13 DIAGNOSIS — E43 Unspecified severe protein-calorie malnutrition: Secondary | ICD-10-CM | POA: Diagnosis not present

## 2019-06-13 DIAGNOSIS — J449 Chronic obstructive pulmonary disease, unspecified: Secondary | ICD-10-CM | POA: Diagnosis not present

## 2019-06-14 DIAGNOSIS — R531 Weakness: Secondary | ICD-10-CM | POA: Diagnosis not present

## 2019-06-14 DIAGNOSIS — E43 Unspecified severe protein-calorie malnutrition: Secondary | ICD-10-CM | POA: Diagnosis not present

## 2019-06-14 DIAGNOSIS — E039 Hypothyroidism, unspecified: Secondary | ICD-10-CM | POA: Diagnosis not present

## 2019-06-14 DIAGNOSIS — I5033 Acute on chronic diastolic (congestive) heart failure: Secondary | ICD-10-CM | POA: Diagnosis not present

## 2019-06-14 DIAGNOSIS — J449 Chronic obstructive pulmonary disease, unspecified: Secondary | ICD-10-CM | POA: Diagnosis not present

## 2019-06-14 DIAGNOSIS — J9611 Chronic respiratory failure with hypoxia: Secondary | ICD-10-CM | POA: Diagnosis not present

## 2019-06-15 DIAGNOSIS — J9611 Chronic respiratory failure with hypoxia: Secondary | ICD-10-CM | POA: Diagnosis not present

## 2019-06-15 DIAGNOSIS — E039 Hypothyroidism, unspecified: Secondary | ICD-10-CM | POA: Diagnosis not present

## 2019-06-15 DIAGNOSIS — E43 Unspecified severe protein-calorie malnutrition: Secondary | ICD-10-CM | POA: Diagnosis not present

## 2019-06-15 DIAGNOSIS — I5033 Acute on chronic diastolic (congestive) heart failure: Secondary | ICD-10-CM | POA: Diagnosis not present

## 2019-06-15 DIAGNOSIS — J449 Chronic obstructive pulmonary disease, unspecified: Secondary | ICD-10-CM | POA: Diagnosis not present

## 2019-06-15 DIAGNOSIS — R531 Weakness: Secondary | ICD-10-CM | POA: Diagnosis not present

## 2019-06-16 DIAGNOSIS — E43 Unspecified severe protein-calorie malnutrition: Secondary | ICD-10-CM | POA: Diagnosis not present

## 2019-06-16 DIAGNOSIS — R531 Weakness: Secondary | ICD-10-CM | POA: Diagnosis not present

## 2019-06-16 DIAGNOSIS — E039 Hypothyroidism, unspecified: Secondary | ICD-10-CM | POA: Diagnosis not present

## 2019-06-16 DIAGNOSIS — J9611 Chronic respiratory failure with hypoxia: Secondary | ICD-10-CM | POA: Diagnosis not present

## 2019-06-16 DIAGNOSIS — J449 Chronic obstructive pulmonary disease, unspecified: Secondary | ICD-10-CM | POA: Diagnosis not present

## 2019-06-16 DIAGNOSIS — I5033 Acute on chronic diastolic (congestive) heart failure: Secondary | ICD-10-CM | POA: Diagnosis not present

## 2019-06-19 DIAGNOSIS — J9611 Chronic respiratory failure with hypoxia: Secondary | ICD-10-CM | POA: Diagnosis not present

## 2019-06-19 DIAGNOSIS — E039 Hypothyroidism, unspecified: Secondary | ICD-10-CM | POA: Diagnosis not present

## 2019-06-19 DIAGNOSIS — E43 Unspecified severe protein-calorie malnutrition: Secondary | ICD-10-CM | POA: Diagnosis not present

## 2019-06-19 DIAGNOSIS — R531 Weakness: Secondary | ICD-10-CM | POA: Diagnosis not present

## 2019-06-19 DIAGNOSIS — J449 Chronic obstructive pulmonary disease, unspecified: Secondary | ICD-10-CM | POA: Diagnosis not present

## 2019-06-19 DIAGNOSIS — I5033 Acute on chronic diastolic (congestive) heart failure: Secondary | ICD-10-CM | POA: Diagnosis not present

## 2019-06-20 DIAGNOSIS — E039 Hypothyroidism, unspecified: Secondary | ICD-10-CM | POA: Diagnosis not present

## 2019-06-20 DIAGNOSIS — E43 Unspecified severe protein-calorie malnutrition: Secondary | ICD-10-CM | POA: Diagnosis not present

## 2019-06-20 DIAGNOSIS — J449 Chronic obstructive pulmonary disease, unspecified: Secondary | ICD-10-CM | POA: Diagnosis not present

## 2019-06-20 DIAGNOSIS — J9611 Chronic respiratory failure with hypoxia: Secondary | ICD-10-CM | POA: Diagnosis not present

## 2019-06-20 DIAGNOSIS — I5033 Acute on chronic diastolic (congestive) heart failure: Secondary | ICD-10-CM | POA: Diagnosis not present

## 2019-06-20 DIAGNOSIS — R531 Weakness: Secondary | ICD-10-CM | POA: Diagnosis not present

## 2019-06-21 DIAGNOSIS — E43 Unspecified severe protein-calorie malnutrition: Secondary | ICD-10-CM | POA: Diagnosis not present

## 2019-06-21 DIAGNOSIS — J9611 Chronic respiratory failure with hypoxia: Secondary | ICD-10-CM | POA: Diagnosis not present

## 2019-06-21 DIAGNOSIS — I5033 Acute on chronic diastolic (congestive) heart failure: Secondary | ICD-10-CM | POA: Diagnosis not present

## 2019-06-21 DIAGNOSIS — R531 Weakness: Secondary | ICD-10-CM | POA: Diagnosis not present

## 2019-06-21 DIAGNOSIS — E039 Hypothyroidism, unspecified: Secondary | ICD-10-CM | POA: Diagnosis not present

## 2019-06-21 DIAGNOSIS — J449 Chronic obstructive pulmonary disease, unspecified: Secondary | ICD-10-CM | POA: Diagnosis not present

## 2019-06-23 DIAGNOSIS — E039 Hypothyroidism, unspecified: Secondary | ICD-10-CM | POA: Diagnosis not present

## 2019-06-23 DIAGNOSIS — R531 Weakness: Secondary | ICD-10-CM | POA: Diagnosis not present

## 2019-06-23 DIAGNOSIS — J449 Chronic obstructive pulmonary disease, unspecified: Secondary | ICD-10-CM | POA: Diagnosis not present

## 2019-06-23 DIAGNOSIS — J9611 Chronic respiratory failure with hypoxia: Secondary | ICD-10-CM | POA: Diagnosis not present

## 2019-06-23 DIAGNOSIS — E43 Unspecified severe protein-calorie malnutrition: Secondary | ICD-10-CM | POA: Diagnosis not present

## 2019-06-23 DIAGNOSIS — I5033 Acute on chronic diastolic (congestive) heart failure: Secondary | ICD-10-CM | POA: Diagnosis not present

## 2019-06-26 DIAGNOSIS — R531 Weakness: Secondary | ICD-10-CM | POA: Diagnosis not present

## 2019-06-26 DIAGNOSIS — E039 Hypothyroidism, unspecified: Secondary | ICD-10-CM | POA: Diagnosis not present

## 2019-06-26 DIAGNOSIS — J9611 Chronic respiratory failure with hypoxia: Secondary | ICD-10-CM | POA: Diagnosis not present

## 2019-06-26 DIAGNOSIS — J449 Chronic obstructive pulmonary disease, unspecified: Secondary | ICD-10-CM | POA: Diagnosis not present

## 2019-06-26 DIAGNOSIS — I5033 Acute on chronic diastolic (congestive) heart failure: Secondary | ICD-10-CM | POA: Diagnosis not present

## 2019-06-26 DIAGNOSIS — E43 Unspecified severe protein-calorie malnutrition: Secondary | ICD-10-CM | POA: Diagnosis not present

## 2019-06-27 DIAGNOSIS — E43 Unspecified severe protein-calorie malnutrition: Secondary | ICD-10-CM | POA: Diagnosis not present

## 2019-06-27 DIAGNOSIS — J449 Chronic obstructive pulmonary disease, unspecified: Secondary | ICD-10-CM | POA: Diagnosis not present

## 2019-06-27 DIAGNOSIS — R531 Weakness: Secondary | ICD-10-CM | POA: Diagnosis not present

## 2019-06-27 DIAGNOSIS — E039 Hypothyroidism, unspecified: Secondary | ICD-10-CM | POA: Diagnosis not present

## 2019-06-27 DIAGNOSIS — I5033 Acute on chronic diastolic (congestive) heart failure: Secondary | ICD-10-CM | POA: Diagnosis not present

## 2019-06-27 DIAGNOSIS — J9611 Chronic respiratory failure with hypoxia: Secondary | ICD-10-CM | POA: Diagnosis not present

## 2019-06-28 DIAGNOSIS — J449 Chronic obstructive pulmonary disease, unspecified: Secondary | ICD-10-CM | POA: Diagnosis not present

## 2019-06-28 DIAGNOSIS — E039 Hypothyroidism, unspecified: Secondary | ICD-10-CM | POA: Diagnosis not present

## 2019-06-28 DIAGNOSIS — J9611 Chronic respiratory failure with hypoxia: Secondary | ICD-10-CM | POA: Diagnosis not present

## 2019-06-28 DIAGNOSIS — I5033 Acute on chronic diastolic (congestive) heart failure: Secondary | ICD-10-CM | POA: Diagnosis not present

## 2019-06-28 DIAGNOSIS — R531 Weakness: Secondary | ICD-10-CM | POA: Diagnosis not present

## 2019-06-28 DIAGNOSIS — E43 Unspecified severe protein-calorie malnutrition: Secondary | ICD-10-CM | POA: Diagnosis not present

## 2019-06-30 DIAGNOSIS — J449 Chronic obstructive pulmonary disease, unspecified: Secondary | ICD-10-CM | POA: Diagnosis not present

## 2019-06-30 DIAGNOSIS — E43 Unspecified severe protein-calorie malnutrition: Secondary | ICD-10-CM | POA: Diagnosis not present

## 2019-06-30 DIAGNOSIS — J9611 Chronic respiratory failure with hypoxia: Secondary | ICD-10-CM | POA: Diagnosis not present

## 2019-06-30 DIAGNOSIS — I5033 Acute on chronic diastolic (congestive) heart failure: Secondary | ICD-10-CM | POA: Diagnosis not present

## 2019-06-30 DIAGNOSIS — E039 Hypothyroidism, unspecified: Secondary | ICD-10-CM | POA: Diagnosis not present

## 2019-06-30 DIAGNOSIS — R531 Weakness: Secondary | ICD-10-CM | POA: Diagnosis not present

## 2019-07-03 DIAGNOSIS — I5033 Acute on chronic diastolic (congestive) heart failure: Secondary | ICD-10-CM | POA: Diagnosis not present

## 2019-07-03 DIAGNOSIS — R531 Weakness: Secondary | ICD-10-CM | POA: Diagnosis not present

## 2019-07-03 DIAGNOSIS — E039 Hypothyroidism, unspecified: Secondary | ICD-10-CM | POA: Diagnosis not present

## 2019-07-03 DIAGNOSIS — J9611 Chronic respiratory failure with hypoxia: Secondary | ICD-10-CM | POA: Diagnosis not present

## 2019-07-03 DIAGNOSIS — E43 Unspecified severe protein-calorie malnutrition: Secondary | ICD-10-CM | POA: Diagnosis not present

## 2019-07-03 DIAGNOSIS — J449 Chronic obstructive pulmonary disease, unspecified: Secondary | ICD-10-CM | POA: Diagnosis not present

## 2019-08-04 DIAGNOSIS — R0602 Shortness of breath: Secondary | ICD-10-CM | POA: Diagnosis not present

## 2019-08-04 DIAGNOSIS — E039 Hypothyroidism, unspecified: Secondary | ICD-10-CM | POA: Diagnosis not present

## 2019-08-04 DIAGNOSIS — I4891 Unspecified atrial fibrillation: Secondary | ICD-10-CM | POA: Diagnosis not present

## 2019-08-04 DIAGNOSIS — J9611 Chronic respiratory failure with hypoxia: Secondary | ICD-10-CM | POA: Diagnosis not present

## 2019-08-04 DIAGNOSIS — I5033 Acute on chronic diastolic (congestive) heart failure: Secondary | ICD-10-CM | POA: Diagnosis not present

## 2019-08-04 DIAGNOSIS — R0600 Dyspnea, unspecified: Secondary | ICD-10-CM | POA: Diagnosis not present

## 2019-08-04 DIAGNOSIS — R531 Weakness: Secondary | ICD-10-CM | POA: Diagnosis not present

## 2019-08-04 DIAGNOSIS — E43 Unspecified severe protein-calorie malnutrition: Secondary | ICD-10-CM | POA: Diagnosis not present

## 2019-08-04 DIAGNOSIS — Z9981 Dependence on supplemental oxygen: Secondary | ICD-10-CM | POA: Diagnosis not present

## 2019-08-04 DIAGNOSIS — J449 Chronic obstructive pulmonary disease, unspecified: Secondary | ICD-10-CM | POA: Diagnosis not present

## 2019-08-04 DIAGNOSIS — I5032 Chronic diastolic (congestive) heart failure: Secondary | ICD-10-CM | POA: Diagnosis not present

## 2019-08-04 DIAGNOSIS — J918 Pleural effusion in other conditions classified elsewhere: Secondary | ICD-10-CM | POA: Diagnosis not present

## 2019-08-07 DIAGNOSIS — E039 Hypothyroidism, unspecified: Secondary | ICD-10-CM | POA: Diagnosis not present

## 2019-08-07 DIAGNOSIS — J9611 Chronic respiratory failure with hypoxia: Secondary | ICD-10-CM | POA: Diagnosis not present

## 2019-08-07 DIAGNOSIS — I5033 Acute on chronic diastolic (congestive) heart failure: Secondary | ICD-10-CM | POA: Diagnosis not present

## 2019-08-07 DIAGNOSIS — J449 Chronic obstructive pulmonary disease, unspecified: Secondary | ICD-10-CM | POA: Diagnosis not present

## 2019-08-07 DIAGNOSIS — E43 Unspecified severe protein-calorie malnutrition: Secondary | ICD-10-CM | POA: Diagnosis not present

## 2019-08-07 DIAGNOSIS — R531 Weakness: Secondary | ICD-10-CM | POA: Diagnosis not present

## 2019-08-09 DIAGNOSIS — E43 Unspecified severe protein-calorie malnutrition: Secondary | ICD-10-CM | POA: Diagnosis not present

## 2019-08-09 DIAGNOSIS — R531 Weakness: Secondary | ICD-10-CM | POA: Diagnosis not present

## 2019-08-09 DIAGNOSIS — J9611 Chronic respiratory failure with hypoxia: Secondary | ICD-10-CM | POA: Diagnosis not present

## 2019-08-09 DIAGNOSIS — E039 Hypothyroidism, unspecified: Secondary | ICD-10-CM | POA: Diagnosis not present

## 2019-08-09 DIAGNOSIS — I5033 Acute on chronic diastolic (congestive) heart failure: Secondary | ICD-10-CM | POA: Diagnosis not present

## 2019-08-09 DIAGNOSIS — J449 Chronic obstructive pulmonary disease, unspecified: Secondary | ICD-10-CM | POA: Diagnosis not present

## 2019-08-11 DIAGNOSIS — I5033 Acute on chronic diastolic (congestive) heart failure: Secondary | ICD-10-CM | POA: Diagnosis not present

## 2019-08-11 DIAGNOSIS — J449 Chronic obstructive pulmonary disease, unspecified: Secondary | ICD-10-CM | POA: Diagnosis not present

## 2019-08-11 DIAGNOSIS — E43 Unspecified severe protein-calorie malnutrition: Secondary | ICD-10-CM | POA: Diagnosis not present

## 2019-08-11 DIAGNOSIS — E039 Hypothyroidism, unspecified: Secondary | ICD-10-CM | POA: Diagnosis not present

## 2019-08-11 DIAGNOSIS — R531 Weakness: Secondary | ICD-10-CM | POA: Diagnosis not present

## 2019-08-11 DIAGNOSIS — J9611 Chronic respiratory failure with hypoxia: Secondary | ICD-10-CM | POA: Diagnosis not present

## 2019-08-14 DIAGNOSIS — J9611 Chronic respiratory failure with hypoxia: Secondary | ICD-10-CM | POA: Diagnosis not present

## 2019-08-14 DIAGNOSIS — R531 Weakness: Secondary | ICD-10-CM | POA: Diagnosis not present

## 2019-08-14 DIAGNOSIS — E43 Unspecified severe protein-calorie malnutrition: Secondary | ICD-10-CM | POA: Diagnosis not present

## 2019-08-14 DIAGNOSIS — I5033 Acute on chronic diastolic (congestive) heart failure: Secondary | ICD-10-CM | POA: Diagnosis not present

## 2019-08-14 DIAGNOSIS — E039 Hypothyroidism, unspecified: Secondary | ICD-10-CM | POA: Diagnosis not present

## 2019-08-14 DIAGNOSIS — J449 Chronic obstructive pulmonary disease, unspecified: Secondary | ICD-10-CM | POA: Diagnosis not present

## 2019-08-15 DIAGNOSIS — I5033 Acute on chronic diastolic (congestive) heart failure: Secondary | ICD-10-CM | POA: Diagnosis not present

## 2019-08-15 DIAGNOSIS — R531 Weakness: Secondary | ICD-10-CM | POA: Diagnosis not present

## 2019-08-15 DIAGNOSIS — E43 Unspecified severe protein-calorie malnutrition: Secondary | ICD-10-CM | POA: Diagnosis not present

## 2019-08-15 DIAGNOSIS — J449 Chronic obstructive pulmonary disease, unspecified: Secondary | ICD-10-CM | POA: Diagnosis not present

## 2019-08-15 DIAGNOSIS — E039 Hypothyroidism, unspecified: Secondary | ICD-10-CM | POA: Diagnosis not present

## 2019-08-15 DIAGNOSIS — J9611 Chronic respiratory failure with hypoxia: Secondary | ICD-10-CM | POA: Diagnosis not present

## 2019-08-16 DIAGNOSIS — I5033 Acute on chronic diastolic (congestive) heart failure: Secondary | ICD-10-CM | POA: Diagnosis not present

## 2019-08-16 DIAGNOSIS — E039 Hypothyroidism, unspecified: Secondary | ICD-10-CM | POA: Diagnosis not present

## 2019-08-16 DIAGNOSIS — E43 Unspecified severe protein-calorie malnutrition: Secondary | ICD-10-CM | POA: Diagnosis not present

## 2019-08-16 DIAGNOSIS — J449 Chronic obstructive pulmonary disease, unspecified: Secondary | ICD-10-CM | POA: Diagnosis not present

## 2019-08-16 DIAGNOSIS — J9611 Chronic respiratory failure with hypoxia: Secondary | ICD-10-CM | POA: Diagnosis not present

## 2019-08-16 DIAGNOSIS — R531 Weakness: Secondary | ICD-10-CM | POA: Diagnosis not present

## 2019-08-17 DIAGNOSIS — E43 Unspecified severe protein-calorie malnutrition: Secondary | ICD-10-CM | POA: Diagnosis not present

## 2019-08-17 DIAGNOSIS — R531 Weakness: Secondary | ICD-10-CM | POA: Diagnosis not present

## 2019-08-17 DIAGNOSIS — J449 Chronic obstructive pulmonary disease, unspecified: Secondary | ICD-10-CM | POA: Diagnosis not present

## 2019-08-17 DIAGNOSIS — I5033 Acute on chronic diastolic (congestive) heart failure: Secondary | ICD-10-CM | POA: Diagnosis not present

## 2019-08-17 DIAGNOSIS — E039 Hypothyroidism, unspecified: Secondary | ICD-10-CM | POA: Diagnosis not present

## 2019-08-17 DIAGNOSIS — J9611 Chronic respiratory failure with hypoxia: Secondary | ICD-10-CM | POA: Diagnosis not present

## 2019-08-18 DIAGNOSIS — E43 Unspecified severe protein-calorie malnutrition: Secondary | ICD-10-CM | POA: Diagnosis not present

## 2019-08-18 DIAGNOSIS — J9611 Chronic respiratory failure with hypoxia: Secondary | ICD-10-CM | POA: Diagnosis not present

## 2019-08-18 DIAGNOSIS — E039 Hypothyroidism, unspecified: Secondary | ICD-10-CM | POA: Diagnosis not present

## 2019-08-18 DIAGNOSIS — I5033 Acute on chronic diastolic (congestive) heart failure: Secondary | ICD-10-CM | POA: Diagnosis not present

## 2019-08-18 DIAGNOSIS — J449 Chronic obstructive pulmonary disease, unspecified: Secondary | ICD-10-CM | POA: Diagnosis not present

## 2019-08-18 DIAGNOSIS — R531 Weakness: Secondary | ICD-10-CM | POA: Diagnosis not present

## 2019-08-21 DIAGNOSIS — I5033 Acute on chronic diastolic (congestive) heart failure: Secondary | ICD-10-CM | POA: Diagnosis not present

## 2019-08-21 DIAGNOSIS — J9611 Chronic respiratory failure with hypoxia: Secondary | ICD-10-CM | POA: Diagnosis not present

## 2019-08-21 DIAGNOSIS — R531 Weakness: Secondary | ICD-10-CM | POA: Diagnosis not present

## 2019-08-21 DIAGNOSIS — J449 Chronic obstructive pulmonary disease, unspecified: Secondary | ICD-10-CM | POA: Diagnosis not present

## 2019-08-21 DIAGNOSIS — E43 Unspecified severe protein-calorie malnutrition: Secondary | ICD-10-CM | POA: Diagnosis not present

## 2019-08-21 DIAGNOSIS — E039 Hypothyroidism, unspecified: Secondary | ICD-10-CM | POA: Diagnosis not present

## 2019-08-23 DIAGNOSIS — E43 Unspecified severe protein-calorie malnutrition: Secondary | ICD-10-CM | POA: Diagnosis not present

## 2019-08-23 DIAGNOSIS — I5033 Acute on chronic diastolic (congestive) heart failure: Secondary | ICD-10-CM | POA: Diagnosis not present

## 2019-08-23 DIAGNOSIS — E039 Hypothyroidism, unspecified: Secondary | ICD-10-CM | POA: Diagnosis not present

## 2019-08-23 DIAGNOSIS — R531 Weakness: Secondary | ICD-10-CM | POA: Diagnosis not present

## 2019-08-23 DIAGNOSIS — J9611 Chronic respiratory failure with hypoxia: Secondary | ICD-10-CM | POA: Diagnosis not present

## 2019-08-23 DIAGNOSIS — J449 Chronic obstructive pulmonary disease, unspecified: Secondary | ICD-10-CM | POA: Diagnosis not present

## 2019-08-25 DIAGNOSIS — J9611 Chronic respiratory failure with hypoxia: Secondary | ICD-10-CM | POA: Diagnosis not present

## 2019-08-25 DIAGNOSIS — E43 Unspecified severe protein-calorie malnutrition: Secondary | ICD-10-CM | POA: Diagnosis not present

## 2019-08-25 DIAGNOSIS — J449 Chronic obstructive pulmonary disease, unspecified: Secondary | ICD-10-CM | POA: Diagnosis not present

## 2019-08-25 DIAGNOSIS — E039 Hypothyroidism, unspecified: Secondary | ICD-10-CM | POA: Diagnosis not present

## 2019-08-25 DIAGNOSIS — I5033 Acute on chronic diastolic (congestive) heart failure: Secondary | ICD-10-CM | POA: Diagnosis not present

## 2019-08-25 DIAGNOSIS — R531 Weakness: Secondary | ICD-10-CM | POA: Diagnosis not present

## 2019-08-28 DIAGNOSIS — R531 Weakness: Secondary | ICD-10-CM | POA: Diagnosis not present

## 2019-08-28 DIAGNOSIS — J9611 Chronic respiratory failure with hypoxia: Secondary | ICD-10-CM | POA: Diagnosis not present

## 2019-08-28 DIAGNOSIS — E43 Unspecified severe protein-calorie malnutrition: Secondary | ICD-10-CM | POA: Diagnosis not present

## 2019-08-28 DIAGNOSIS — I5033 Acute on chronic diastolic (congestive) heart failure: Secondary | ICD-10-CM | POA: Diagnosis not present

## 2019-08-28 DIAGNOSIS — J449 Chronic obstructive pulmonary disease, unspecified: Secondary | ICD-10-CM | POA: Diagnosis not present

## 2019-08-28 DIAGNOSIS — E039 Hypothyroidism, unspecified: Secondary | ICD-10-CM | POA: Diagnosis not present

## 2019-08-30 DIAGNOSIS — E039 Hypothyroidism, unspecified: Secondary | ICD-10-CM | POA: Diagnosis not present

## 2019-08-30 DIAGNOSIS — I5033 Acute on chronic diastolic (congestive) heart failure: Secondary | ICD-10-CM | POA: Diagnosis not present

## 2019-08-30 DIAGNOSIS — E43 Unspecified severe protein-calorie malnutrition: Secondary | ICD-10-CM | POA: Diagnosis not present

## 2019-08-30 DIAGNOSIS — J9611 Chronic respiratory failure with hypoxia: Secondary | ICD-10-CM | POA: Diagnosis not present

## 2019-08-30 DIAGNOSIS — R531 Weakness: Secondary | ICD-10-CM | POA: Diagnosis not present

## 2019-08-30 DIAGNOSIS — J449 Chronic obstructive pulmonary disease, unspecified: Secondary | ICD-10-CM | POA: Diagnosis not present

## 2019-09-01 DIAGNOSIS — E43 Unspecified severe protein-calorie malnutrition: Secondary | ICD-10-CM | POA: Diagnosis not present

## 2019-09-01 DIAGNOSIS — J9611 Chronic respiratory failure with hypoxia: Secondary | ICD-10-CM | POA: Diagnosis not present

## 2019-09-01 DIAGNOSIS — J449 Chronic obstructive pulmonary disease, unspecified: Secondary | ICD-10-CM | POA: Diagnosis not present

## 2019-09-01 DIAGNOSIS — I5033 Acute on chronic diastolic (congestive) heart failure: Secondary | ICD-10-CM | POA: Diagnosis not present

## 2019-09-01 DIAGNOSIS — R531 Weakness: Secondary | ICD-10-CM | POA: Diagnosis not present

## 2019-09-01 DIAGNOSIS — E039 Hypothyroidism, unspecified: Secondary | ICD-10-CM | POA: Diagnosis not present

## 2019-09-04 DIAGNOSIS — J449 Chronic obstructive pulmonary disease, unspecified: Secondary | ICD-10-CM | POA: Diagnosis not present

## 2019-09-04 DIAGNOSIS — I4891 Unspecified atrial fibrillation: Secondary | ICD-10-CM | POA: Diagnosis not present

## 2019-09-04 DIAGNOSIS — J9611 Chronic respiratory failure with hypoxia: Secondary | ICD-10-CM | POA: Diagnosis not present

## 2019-09-04 DIAGNOSIS — R531 Weakness: Secondary | ICD-10-CM | POA: Diagnosis not present

## 2019-09-04 DIAGNOSIS — R0602 Shortness of breath: Secondary | ICD-10-CM | POA: Diagnosis not present

## 2019-09-04 DIAGNOSIS — I5033 Acute on chronic diastolic (congestive) heart failure: Secondary | ICD-10-CM | POA: Diagnosis not present

## 2019-09-04 DIAGNOSIS — J918 Pleural effusion in other conditions classified elsewhere: Secondary | ICD-10-CM | POA: Diagnosis not present

## 2019-09-04 DIAGNOSIS — R0600 Dyspnea, unspecified: Secondary | ICD-10-CM | POA: Diagnosis not present

## 2019-09-04 DIAGNOSIS — E43 Unspecified severe protein-calorie malnutrition: Secondary | ICD-10-CM | POA: Diagnosis not present

## 2019-09-04 DIAGNOSIS — E039 Hypothyroidism, unspecified: Secondary | ICD-10-CM | POA: Diagnosis not present

## 2019-09-04 DIAGNOSIS — Z9981 Dependence on supplemental oxygen: Secondary | ICD-10-CM | POA: Diagnosis not present

## 2019-09-04 DIAGNOSIS — I5032 Chronic diastolic (congestive) heart failure: Secondary | ICD-10-CM | POA: Diagnosis not present

## 2019-09-06 DIAGNOSIS — I5033 Acute on chronic diastolic (congestive) heart failure: Secondary | ICD-10-CM | POA: Diagnosis not present

## 2019-09-06 DIAGNOSIS — R531 Weakness: Secondary | ICD-10-CM | POA: Diagnosis not present

## 2019-09-06 DIAGNOSIS — J449 Chronic obstructive pulmonary disease, unspecified: Secondary | ICD-10-CM | POA: Diagnosis not present

## 2019-09-06 DIAGNOSIS — J9611 Chronic respiratory failure with hypoxia: Secondary | ICD-10-CM | POA: Diagnosis not present

## 2019-09-06 DIAGNOSIS — E039 Hypothyroidism, unspecified: Secondary | ICD-10-CM | POA: Diagnosis not present

## 2019-09-06 DIAGNOSIS — E43 Unspecified severe protein-calorie malnutrition: Secondary | ICD-10-CM | POA: Diagnosis not present

## 2019-09-08 DIAGNOSIS — E039 Hypothyroidism, unspecified: Secondary | ICD-10-CM | POA: Diagnosis not present

## 2019-09-08 DIAGNOSIS — E43 Unspecified severe protein-calorie malnutrition: Secondary | ICD-10-CM | POA: Diagnosis not present

## 2019-09-08 DIAGNOSIS — I5033 Acute on chronic diastolic (congestive) heart failure: Secondary | ICD-10-CM | POA: Diagnosis not present

## 2019-09-08 DIAGNOSIS — J9611 Chronic respiratory failure with hypoxia: Secondary | ICD-10-CM | POA: Diagnosis not present

## 2019-09-08 DIAGNOSIS — J449 Chronic obstructive pulmonary disease, unspecified: Secondary | ICD-10-CM | POA: Diagnosis not present

## 2019-09-08 DIAGNOSIS — R531 Weakness: Secondary | ICD-10-CM | POA: Diagnosis not present

## 2019-09-11 DIAGNOSIS — E039 Hypothyroidism, unspecified: Secondary | ICD-10-CM | POA: Diagnosis not present

## 2019-09-11 DIAGNOSIS — R531 Weakness: Secondary | ICD-10-CM | POA: Diagnosis not present

## 2019-09-11 DIAGNOSIS — J449 Chronic obstructive pulmonary disease, unspecified: Secondary | ICD-10-CM | POA: Diagnosis not present

## 2019-09-11 DIAGNOSIS — E43 Unspecified severe protein-calorie malnutrition: Secondary | ICD-10-CM | POA: Diagnosis not present

## 2019-09-11 DIAGNOSIS — I5033 Acute on chronic diastolic (congestive) heart failure: Secondary | ICD-10-CM | POA: Diagnosis not present

## 2019-09-11 DIAGNOSIS — J9611 Chronic respiratory failure with hypoxia: Secondary | ICD-10-CM | POA: Diagnosis not present

## 2019-09-13 DIAGNOSIS — I5033 Acute on chronic diastolic (congestive) heart failure: Secondary | ICD-10-CM | POA: Diagnosis not present

## 2019-09-13 DIAGNOSIS — R531 Weakness: Secondary | ICD-10-CM | POA: Diagnosis not present

## 2019-09-13 DIAGNOSIS — J449 Chronic obstructive pulmonary disease, unspecified: Secondary | ICD-10-CM | POA: Diagnosis not present

## 2019-09-13 DIAGNOSIS — E43 Unspecified severe protein-calorie malnutrition: Secondary | ICD-10-CM | POA: Diagnosis not present

## 2019-09-13 DIAGNOSIS — E039 Hypothyroidism, unspecified: Secondary | ICD-10-CM | POA: Diagnosis not present

## 2019-09-13 DIAGNOSIS — J9611 Chronic respiratory failure with hypoxia: Secondary | ICD-10-CM | POA: Diagnosis not present

## 2019-09-15 DIAGNOSIS — I5033 Acute on chronic diastolic (congestive) heart failure: Secondary | ICD-10-CM | POA: Diagnosis not present

## 2019-09-15 DIAGNOSIS — E43 Unspecified severe protein-calorie malnutrition: Secondary | ICD-10-CM | POA: Diagnosis not present

## 2019-09-15 DIAGNOSIS — J9611 Chronic respiratory failure with hypoxia: Secondary | ICD-10-CM | POA: Diagnosis not present

## 2019-09-15 DIAGNOSIS — J449 Chronic obstructive pulmonary disease, unspecified: Secondary | ICD-10-CM | POA: Diagnosis not present

## 2019-09-15 DIAGNOSIS — E039 Hypothyroidism, unspecified: Secondary | ICD-10-CM | POA: Diagnosis not present

## 2019-09-15 DIAGNOSIS — R531 Weakness: Secondary | ICD-10-CM | POA: Diagnosis not present

## 2019-09-18 DIAGNOSIS — J9611 Chronic respiratory failure with hypoxia: Secondary | ICD-10-CM | POA: Diagnosis not present

## 2019-09-18 DIAGNOSIS — J449 Chronic obstructive pulmonary disease, unspecified: Secondary | ICD-10-CM | POA: Diagnosis not present

## 2019-09-18 DIAGNOSIS — I5033 Acute on chronic diastolic (congestive) heart failure: Secondary | ICD-10-CM | POA: Diagnosis not present

## 2019-09-18 DIAGNOSIS — E43 Unspecified severe protein-calorie malnutrition: Secondary | ICD-10-CM | POA: Diagnosis not present

## 2019-09-18 DIAGNOSIS — R531 Weakness: Secondary | ICD-10-CM | POA: Diagnosis not present

## 2019-09-18 DIAGNOSIS — E039 Hypothyroidism, unspecified: Secondary | ICD-10-CM | POA: Diagnosis not present

## 2019-09-20 DIAGNOSIS — J449 Chronic obstructive pulmonary disease, unspecified: Secondary | ICD-10-CM | POA: Diagnosis not present

## 2019-09-20 DIAGNOSIS — E43 Unspecified severe protein-calorie malnutrition: Secondary | ICD-10-CM | POA: Diagnosis not present

## 2019-09-20 DIAGNOSIS — E039 Hypothyroidism, unspecified: Secondary | ICD-10-CM | POA: Diagnosis not present

## 2019-09-20 DIAGNOSIS — R531 Weakness: Secondary | ICD-10-CM | POA: Diagnosis not present

## 2019-09-20 DIAGNOSIS — J9611 Chronic respiratory failure with hypoxia: Secondary | ICD-10-CM | POA: Diagnosis not present

## 2019-09-20 DIAGNOSIS — I5033 Acute on chronic diastolic (congestive) heart failure: Secondary | ICD-10-CM | POA: Diagnosis not present

## 2019-09-22 DIAGNOSIS — I5033 Acute on chronic diastolic (congestive) heart failure: Secondary | ICD-10-CM | POA: Diagnosis not present

## 2019-09-22 DIAGNOSIS — E039 Hypothyroidism, unspecified: Secondary | ICD-10-CM | POA: Diagnosis not present

## 2019-09-22 DIAGNOSIS — R531 Weakness: Secondary | ICD-10-CM | POA: Diagnosis not present

## 2019-09-22 DIAGNOSIS — J9611 Chronic respiratory failure with hypoxia: Secondary | ICD-10-CM | POA: Diagnosis not present

## 2019-09-22 DIAGNOSIS — E43 Unspecified severe protein-calorie malnutrition: Secondary | ICD-10-CM | POA: Diagnosis not present

## 2019-09-22 DIAGNOSIS — J449 Chronic obstructive pulmonary disease, unspecified: Secondary | ICD-10-CM | POA: Diagnosis not present

## 2019-09-25 DIAGNOSIS — E039 Hypothyroidism, unspecified: Secondary | ICD-10-CM | POA: Diagnosis not present

## 2019-09-25 DIAGNOSIS — I5033 Acute on chronic diastolic (congestive) heart failure: Secondary | ICD-10-CM | POA: Diagnosis not present

## 2019-09-25 DIAGNOSIS — E43 Unspecified severe protein-calorie malnutrition: Secondary | ICD-10-CM | POA: Diagnosis not present

## 2019-09-25 DIAGNOSIS — J9611 Chronic respiratory failure with hypoxia: Secondary | ICD-10-CM | POA: Diagnosis not present

## 2019-09-25 DIAGNOSIS — R531 Weakness: Secondary | ICD-10-CM | POA: Diagnosis not present

## 2019-09-25 DIAGNOSIS — J449 Chronic obstructive pulmonary disease, unspecified: Secondary | ICD-10-CM | POA: Diagnosis not present

## 2019-09-27 DIAGNOSIS — R531 Weakness: Secondary | ICD-10-CM | POA: Diagnosis not present

## 2019-09-27 DIAGNOSIS — I5033 Acute on chronic diastolic (congestive) heart failure: Secondary | ICD-10-CM | POA: Diagnosis not present

## 2019-09-27 DIAGNOSIS — E43 Unspecified severe protein-calorie malnutrition: Secondary | ICD-10-CM | POA: Diagnosis not present

## 2019-09-27 DIAGNOSIS — E039 Hypothyroidism, unspecified: Secondary | ICD-10-CM | POA: Diagnosis not present

## 2019-09-27 DIAGNOSIS — J9611 Chronic respiratory failure with hypoxia: Secondary | ICD-10-CM | POA: Diagnosis not present

## 2019-09-27 DIAGNOSIS — J449 Chronic obstructive pulmonary disease, unspecified: Secondary | ICD-10-CM | POA: Diagnosis not present

## 2019-09-29 DIAGNOSIS — R531 Weakness: Secondary | ICD-10-CM | POA: Diagnosis not present

## 2019-09-29 DIAGNOSIS — J9611 Chronic respiratory failure with hypoxia: Secondary | ICD-10-CM | POA: Diagnosis not present

## 2019-09-29 DIAGNOSIS — E039 Hypothyroidism, unspecified: Secondary | ICD-10-CM | POA: Diagnosis not present

## 2019-09-29 DIAGNOSIS — I5033 Acute on chronic diastolic (congestive) heart failure: Secondary | ICD-10-CM | POA: Diagnosis not present

## 2019-09-29 DIAGNOSIS — E43 Unspecified severe protein-calorie malnutrition: Secondary | ICD-10-CM | POA: Diagnosis not present

## 2019-09-29 DIAGNOSIS — J449 Chronic obstructive pulmonary disease, unspecified: Secondary | ICD-10-CM | POA: Diagnosis not present

## 2019-10-02 DIAGNOSIS — J918 Pleural effusion in other conditions classified elsewhere: Secondary | ICD-10-CM | POA: Diagnosis not present

## 2019-10-02 DIAGNOSIS — J449 Chronic obstructive pulmonary disease, unspecified: Secondary | ICD-10-CM | POA: Diagnosis not present

## 2019-10-02 DIAGNOSIS — R0602 Shortness of breath: Secondary | ICD-10-CM | POA: Diagnosis not present

## 2019-10-02 DIAGNOSIS — J9611 Chronic respiratory failure with hypoxia: Secondary | ICD-10-CM | POA: Diagnosis not present

## 2019-10-02 DIAGNOSIS — R0600 Dyspnea, unspecified: Secondary | ICD-10-CM | POA: Diagnosis not present

## 2019-10-02 DIAGNOSIS — E039 Hypothyroidism, unspecified: Secondary | ICD-10-CM | POA: Diagnosis not present

## 2019-10-02 DIAGNOSIS — I4891 Unspecified atrial fibrillation: Secondary | ICD-10-CM | POA: Diagnosis not present

## 2019-10-02 DIAGNOSIS — I5032 Chronic diastolic (congestive) heart failure: Secondary | ICD-10-CM | POA: Diagnosis not present

## 2019-10-02 DIAGNOSIS — R531 Weakness: Secondary | ICD-10-CM | POA: Diagnosis not present

## 2019-10-02 DIAGNOSIS — Z9981 Dependence on supplemental oxygen: Secondary | ICD-10-CM | POA: Diagnosis not present

## 2019-10-02 DIAGNOSIS — I5033 Acute on chronic diastolic (congestive) heart failure: Secondary | ICD-10-CM | POA: Diagnosis not present

## 2019-10-02 DIAGNOSIS — E43 Unspecified severe protein-calorie malnutrition: Secondary | ICD-10-CM | POA: Diagnosis not present

## 2019-10-04 DIAGNOSIS — E43 Unspecified severe protein-calorie malnutrition: Secondary | ICD-10-CM | POA: Diagnosis not present

## 2019-10-04 DIAGNOSIS — J449 Chronic obstructive pulmonary disease, unspecified: Secondary | ICD-10-CM | POA: Diagnosis not present

## 2019-10-04 DIAGNOSIS — E039 Hypothyroidism, unspecified: Secondary | ICD-10-CM | POA: Diagnosis not present

## 2019-10-04 DIAGNOSIS — I5033 Acute on chronic diastolic (congestive) heart failure: Secondary | ICD-10-CM | POA: Diagnosis not present

## 2019-10-04 DIAGNOSIS — R531 Weakness: Secondary | ICD-10-CM | POA: Diagnosis not present

## 2019-10-04 DIAGNOSIS — J9611 Chronic respiratory failure with hypoxia: Secondary | ICD-10-CM | POA: Diagnosis not present

## 2019-10-05 DIAGNOSIS — M79676 Pain in unspecified toe(s): Secondary | ICD-10-CM | POA: Diagnosis not present

## 2019-10-05 DIAGNOSIS — J9611 Chronic respiratory failure with hypoxia: Secondary | ICD-10-CM | POA: Diagnosis not present

## 2019-10-05 DIAGNOSIS — E43 Unspecified severe protein-calorie malnutrition: Secondary | ICD-10-CM | POA: Diagnosis not present

## 2019-10-05 DIAGNOSIS — B351 Tinea unguium: Secondary | ICD-10-CM | POA: Diagnosis not present

## 2019-10-05 DIAGNOSIS — E039 Hypothyroidism, unspecified: Secondary | ICD-10-CM | POA: Diagnosis not present

## 2019-10-05 DIAGNOSIS — I5033 Acute on chronic diastolic (congestive) heart failure: Secondary | ICD-10-CM | POA: Diagnosis not present

## 2019-10-05 DIAGNOSIS — R531 Weakness: Secondary | ICD-10-CM | POA: Diagnosis not present

## 2019-10-05 DIAGNOSIS — J449 Chronic obstructive pulmonary disease, unspecified: Secondary | ICD-10-CM | POA: Diagnosis not present

## 2019-10-05 DIAGNOSIS — I70203 Unspecified atherosclerosis of native arteries of extremities, bilateral legs: Secondary | ICD-10-CM | POA: Diagnosis not present

## 2019-10-06 DIAGNOSIS — E43 Unspecified severe protein-calorie malnutrition: Secondary | ICD-10-CM | POA: Diagnosis not present

## 2019-10-06 DIAGNOSIS — I5033 Acute on chronic diastolic (congestive) heart failure: Secondary | ICD-10-CM | POA: Diagnosis not present

## 2019-10-06 DIAGNOSIS — J9611 Chronic respiratory failure with hypoxia: Secondary | ICD-10-CM | POA: Diagnosis not present

## 2019-10-06 DIAGNOSIS — J449 Chronic obstructive pulmonary disease, unspecified: Secondary | ICD-10-CM | POA: Diagnosis not present

## 2019-10-06 DIAGNOSIS — E039 Hypothyroidism, unspecified: Secondary | ICD-10-CM | POA: Diagnosis not present

## 2019-10-06 DIAGNOSIS — R531 Weakness: Secondary | ICD-10-CM | POA: Diagnosis not present

## 2019-10-09 DIAGNOSIS — E039 Hypothyroidism, unspecified: Secondary | ICD-10-CM | POA: Diagnosis not present

## 2019-10-09 DIAGNOSIS — R531 Weakness: Secondary | ICD-10-CM | POA: Diagnosis not present

## 2019-10-09 DIAGNOSIS — E43 Unspecified severe protein-calorie malnutrition: Secondary | ICD-10-CM | POA: Diagnosis not present

## 2019-10-09 DIAGNOSIS — J449 Chronic obstructive pulmonary disease, unspecified: Secondary | ICD-10-CM | POA: Diagnosis not present

## 2019-10-09 DIAGNOSIS — J9611 Chronic respiratory failure with hypoxia: Secondary | ICD-10-CM | POA: Diagnosis not present

## 2019-10-09 DIAGNOSIS — I5033 Acute on chronic diastolic (congestive) heart failure: Secondary | ICD-10-CM | POA: Diagnosis not present

## 2019-10-11 DIAGNOSIS — J449 Chronic obstructive pulmonary disease, unspecified: Secondary | ICD-10-CM | POA: Diagnosis not present

## 2019-10-11 DIAGNOSIS — J9611 Chronic respiratory failure with hypoxia: Secondary | ICD-10-CM | POA: Diagnosis not present

## 2019-10-11 DIAGNOSIS — R531 Weakness: Secondary | ICD-10-CM | POA: Diagnosis not present

## 2019-10-11 DIAGNOSIS — I5033 Acute on chronic diastolic (congestive) heart failure: Secondary | ICD-10-CM | POA: Diagnosis not present

## 2019-10-11 DIAGNOSIS — E039 Hypothyroidism, unspecified: Secondary | ICD-10-CM | POA: Diagnosis not present

## 2019-10-11 DIAGNOSIS — E43 Unspecified severe protein-calorie malnutrition: Secondary | ICD-10-CM | POA: Diagnosis not present

## 2019-10-13 DIAGNOSIS — J449 Chronic obstructive pulmonary disease, unspecified: Secondary | ICD-10-CM | POA: Diagnosis not present

## 2019-10-13 DIAGNOSIS — I5033 Acute on chronic diastolic (congestive) heart failure: Secondary | ICD-10-CM | POA: Diagnosis not present

## 2019-10-13 DIAGNOSIS — J9611 Chronic respiratory failure with hypoxia: Secondary | ICD-10-CM | POA: Diagnosis not present

## 2019-10-13 DIAGNOSIS — R531 Weakness: Secondary | ICD-10-CM | POA: Diagnosis not present

## 2019-10-13 DIAGNOSIS — E039 Hypothyroidism, unspecified: Secondary | ICD-10-CM | POA: Diagnosis not present

## 2019-10-13 DIAGNOSIS — E43 Unspecified severe protein-calorie malnutrition: Secondary | ICD-10-CM | POA: Diagnosis not present

## 2019-10-16 DIAGNOSIS — E039 Hypothyroidism, unspecified: Secondary | ICD-10-CM | POA: Diagnosis not present

## 2019-10-16 DIAGNOSIS — J9611 Chronic respiratory failure with hypoxia: Secondary | ICD-10-CM | POA: Diagnosis not present

## 2019-10-16 DIAGNOSIS — I5033 Acute on chronic diastolic (congestive) heart failure: Secondary | ICD-10-CM | POA: Diagnosis not present

## 2019-10-16 DIAGNOSIS — E43 Unspecified severe protein-calorie malnutrition: Secondary | ICD-10-CM | POA: Diagnosis not present

## 2019-10-16 DIAGNOSIS — R531 Weakness: Secondary | ICD-10-CM | POA: Diagnosis not present

## 2019-10-16 DIAGNOSIS — J449 Chronic obstructive pulmonary disease, unspecified: Secondary | ICD-10-CM | POA: Diagnosis not present

## 2019-10-18 DIAGNOSIS — E43 Unspecified severe protein-calorie malnutrition: Secondary | ICD-10-CM | POA: Diagnosis not present

## 2019-10-18 DIAGNOSIS — J449 Chronic obstructive pulmonary disease, unspecified: Secondary | ICD-10-CM | POA: Diagnosis not present

## 2019-10-18 DIAGNOSIS — R531 Weakness: Secondary | ICD-10-CM | POA: Diagnosis not present

## 2019-10-18 DIAGNOSIS — I5033 Acute on chronic diastolic (congestive) heart failure: Secondary | ICD-10-CM | POA: Diagnosis not present

## 2019-10-18 DIAGNOSIS — J9611 Chronic respiratory failure with hypoxia: Secondary | ICD-10-CM | POA: Diagnosis not present

## 2019-10-18 DIAGNOSIS — E039 Hypothyroidism, unspecified: Secondary | ICD-10-CM | POA: Diagnosis not present

## 2019-10-20 DIAGNOSIS — J449 Chronic obstructive pulmonary disease, unspecified: Secondary | ICD-10-CM | POA: Diagnosis not present

## 2019-10-20 DIAGNOSIS — I5033 Acute on chronic diastolic (congestive) heart failure: Secondary | ICD-10-CM | POA: Diagnosis not present

## 2019-10-20 DIAGNOSIS — J9611 Chronic respiratory failure with hypoxia: Secondary | ICD-10-CM | POA: Diagnosis not present

## 2019-10-20 DIAGNOSIS — R531 Weakness: Secondary | ICD-10-CM | POA: Diagnosis not present

## 2019-10-20 DIAGNOSIS — E43 Unspecified severe protein-calorie malnutrition: Secondary | ICD-10-CM | POA: Diagnosis not present

## 2019-10-20 DIAGNOSIS — E039 Hypothyroidism, unspecified: Secondary | ICD-10-CM | POA: Diagnosis not present

## 2019-10-23 DIAGNOSIS — R531 Weakness: Secondary | ICD-10-CM | POA: Diagnosis not present

## 2019-10-23 DIAGNOSIS — E43 Unspecified severe protein-calorie malnutrition: Secondary | ICD-10-CM | POA: Diagnosis not present

## 2019-10-23 DIAGNOSIS — I5033 Acute on chronic diastolic (congestive) heart failure: Secondary | ICD-10-CM | POA: Diagnosis not present

## 2019-10-23 DIAGNOSIS — J449 Chronic obstructive pulmonary disease, unspecified: Secondary | ICD-10-CM | POA: Diagnosis not present

## 2019-10-23 DIAGNOSIS — E039 Hypothyroidism, unspecified: Secondary | ICD-10-CM | POA: Diagnosis not present

## 2019-10-23 DIAGNOSIS — J9611 Chronic respiratory failure with hypoxia: Secondary | ICD-10-CM | POA: Diagnosis not present

## 2019-10-25 DIAGNOSIS — E43 Unspecified severe protein-calorie malnutrition: Secondary | ICD-10-CM | POA: Diagnosis not present

## 2019-10-25 DIAGNOSIS — J449 Chronic obstructive pulmonary disease, unspecified: Secondary | ICD-10-CM | POA: Diagnosis not present

## 2019-10-25 DIAGNOSIS — E039 Hypothyroidism, unspecified: Secondary | ICD-10-CM | POA: Diagnosis not present

## 2019-10-25 DIAGNOSIS — R531 Weakness: Secondary | ICD-10-CM | POA: Diagnosis not present

## 2019-10-25 DIAGNOSIS — J9611 Chronic respiratory failure with hypoxia: Secondary | ICD-10-CM | POA: Diagnosis not present

## 2019-10-25 DIAGNOSIS — I5033 Acute on chronic diastolic (congestive) heart failure: Secondary | ICD-10-CM | POA: Diagnosis not present

## 2019-10-27 DIAGNOSIS — J9611 Chronic respiratory failure with hypoxia: Secondary | ICD-10-CM | POA: Diagnosis not present

## 2019-10-27 DIAGNOSIS — R531 Weakness: Secondary | ICD-10-CM | POA: Diagnosis not present

## 2019-10-27 DIAGNOSIS — I5033 Acute on chronic diastolic (congestive) heart failure: Secondary | ICD-10-CM | POA: Diagnosis not present

## 2019-10-27 DIAGNOSIS — E43 Unspecified severe protein-calorie malnutrition: Secondary | ICD-10-CM | POA: Diagnosis not present

## 2019-10-27 DIAGNOSIS — E039 Hypothyroidism, unspecified: Secondary | ICD-10-CM | POA: Diagnosis not present

## 2019-10-27 DIAGNOSIS — J449 Chronic obstructive pulmonary disease, unspecified: Secondary | ICD-10-CM | POA: Diagnosis not present

## 2019-10-30 DIAGNOSIS — R531 Weakness: Secondary | ICD-10-CM | POA: Diagnosis not present

## 2019-10-30 DIAGNOSIS — E039 Hypothyroidism, unspecified: Secondary | ICD-10-CM | POA: Diagnosis not present

## 2019-10-30 DIAGNOSIS — J9611 Chronic respiratory failure with hypoxia: Secondary | ICD-10-CM | POA: Diagnosis not present

## 2019-10-30 DIAGNOSIS — E43 Unspecified severe protein-calorie malnutrition: Secondary | ICD-10-CM | POA: Diagnosis not present

## 2019-10-30 DIAGNOSIS — J449 Chronic obstructive pulmonary disease, unspecified: Secondary | ICD-10-CM | POA: Diagnosis not present

## 2019-10-30 DIAGNOSIS — I5033 Acute on chronic diastolic (congestive) heart failure: Secondary | ICD-10-CM | POA: Diagnosis not present

## 2019-10-31 ENCOUNTER — Other Ambulatory Visit (HOSPITAL_COMMUNITY)
Admission: RE | Admit: 2019-10-31 | Discharge: 2019-10-31 | Disposition: A | Discharge status: Home / Self Care | Source: Ambulatory Visit | Attending: Adult Health Nurse Practitioner | Admitting: Adult Health Nurse Practitioner

## 2019-10-31 DIAGNOSIS — N390 Urinary tract infection, site not specified: Secondary | ICD-10-CM | POA: Diagnosis not present

## 2019-10-31 DIAGNOSIS — E43 Unspecified severe protein-calorie malnutrition: Secondary | ICD-10-CM | POA: Diagnosis not present

## 2019-10-31 DIAGNOSIS — R531 Weakness: Secondary | ICD-10-CM | POA: Diagnosis not present

## 2019-10-31 DIAGNOSIS — J449 Chronic obstructive pulmonary disease, unspecified: Secondary | ICD-10-CM | POA: Diagnosis not present

## 2019-10-31 DIAGNOSIS — J9611 Chronic respiratory failure with hypoxia: Secondary | ICD-10-CM | POA: Diagnosis not present

## 2019-10-31 DIAGNOSIS — E039 Hypothyroidism, unspecified: Secondary | ICD-10-CM | POA: Diagnosis not present

## 2019-10-31 DIAGNOSIS — I5033 Acute on chronic diastolic (congestive) heart failure: Secondary | ICD-10-CM | POA: Diagnosis not present

## 2019-10-31 LAB — URINALYSIS, COMPLETE (UACMP) WITH MICROSCOPIC
Bacteria, UA: NONE SEEN
Bilirubin Urine: NEGATIVE
Glucose, UA: NEGATIVE mg/dL
Hgb urine dipstick: NEGATIVE
Ketones, ur: NEGATIVE mg/dL
Nitrite: NEGATIVE
Protein, ur: NEGATIVE mg/dL
Specific Gravity, Urine: 1.015 (ref 1.005–1.030)
WBC, UA: >50 WBC/hpf — ABNORMAL HIGH (ref 0–5)
pH: 6 (ref 5.0–8.0)

## 2019-11-01 DIAGNOSIS — J9611 Chronic respiratory failure with hypoxia: Secondary | ICD-10-CM | POA: Diagnosis not present

## 2019-11-01 DIAGNOSIS — R531 Weakness: Secondary | ICD-10-CM | POA: Diagnosis not present

## 2019-11-01 DIAGNOSIS — E43 Unspecified severe protein-calorie malnutrition: Secondary | ICD-10-CM | POA: Diagnosis not present

## 2019-11-01 DIAGNOSIS — J449 Chronic obstructive pulmonary disease, unspecified: Secondary | ICD-10-CM | POA: Diagnosis not present

## 2019-11-01 DIAGNOSIS — I5033 Acute on chronic diastolic (congestive) heart failure: Secondary | ICD-10-CM | POA: Diagnosis not present

## 2019-11-01 DIAGNOSIS — E039 Hypothyroidism, unspecified: Secondary | ICD-10-CM | POA: Diagnosis not present

## 2019-11-02 DIAGNOSIS — R531 Weakness: Secondary | ICD-10-CM | POA: Diagnosis not present

## 2019-11-02 DIAGNOSIS — Z9981 Dependence on supplemental oxygen: Secondary | ICD-10-CM | POA: Diagnosis not present

## 2019-11-02 DIAGNOSIS — I5032 Chronic diastolic (congestive) heart failure: Secondary | ICD-10-CM | POA: Diagnosis not present

## 2019-11-02 DIAGNOSIS — R0600 Dyspnea, unspecified: Secondary | ICD-10-CM | POA: Diagnosis not present

## 2019-11-02 DIAGNOSIS — I5033 Acute on chronic diastolic (congestive) heart failure: Secondary | ICD-10-CM | POA: Diagnosis not present

## 2019-11-02 DIAGNOSIS — E039 Hypothyroidism, unspecified: Secondary | ICD-10-CM | POA: Diagnosis not present

## 2019-11-02 DIAGNOSIS — J918 Pleural effusion in other conditions classified elsewhere: Secondary | ICD-10-CM | POA: Diagnosis not present

## 2019-11-02 DIAGNOSIS — E43 Unspecified severe protein-calorie malnutrition: Secondary | ICD-10-CM | POA: Diagnosis not present

## 2019-11-02 DIAGNOSIS — R0602 Shortness of breath: Secondary | ICD-10-CM | POA: Diagnosis not present

## 2019-11-02 DIAGNOSIS — I4891 Unspecified atrial fibrillation: Secondary | ICD-10-CM | POA: Diagnosis not present

## 2019-11-02 DIAGNOSIS — J449 Chronic obstructive pulmonary disease, unspecified: Secondary | ICD-10-CM | POA: Diagnosis not present

## 2019-11-02 DIAGNOSIS — J9611 Chronic respiratory failure with hypoxia: Secondary | ICD-10-CM | POA: Diagnosis not present

## 2019-11-02 LAB — URINE CULTURE: Culture: 60000 — AB

## 2019-11-03 DIAGNOSIS — R531 Weakness: Secondary | ICD-10-CM | POA: Diagnosis not present

## 2019-11-03 DIAGNOSIS — J449 Chronic obstructive pulmonary disease, unspecified: Secondary | ICD-10-CM | POA: Diagnosis not present

## 2019-11-03 DIAGNOSIS — E039 Hypothyroidism, unspecified: Secondary | ICD-10-CM | POA: Diagnosis not present

## 2019-11-03 DIAGNOSIS — I5033 Acute on chronic diastolic (congestive) heart failure: Secondary | ICD-10-CM | POA: Diagnosis not present

## 2019-11-03 DIAGNOSIS — E43 Unspecified severe protein-calorie malnutrition: Secondary | ICD-10-CM | POA: Diagnosis not present

## 2019-11-03 DIAGNOSIS — J9611 Chronic respiratory failure with hypoxia: Secondary | ICD-10-CM | POA: Diagnosis not present

## 2019-11-06 DIAGNOSIS — E43 Unspecified severe protein-calorie malnutrition: Secondary | ICD-10-CM | POA: Diagnosis not present

## 2019-11-06 DIAGNOSIS — I5033 Acute on chronic diastolic (congestive) heart failure: Secondary | ICD-10-CM | POA: Diagnosis not present

## 2019-11-06 DIAGNOSIS — E039 Hypothyroidism, unspecified: Secondary | ICD-10-CM | POA: Diagnosis not present

## 2019-11-06 DIAGNOSIS — J9611 Chronic respiratory failure with hypoxia: Secondary | ICD-10-CM | POA: Diagnosis not present

## 2019-11-06 DIAGNOSIS — J449 Chronic obstructive pulmonary disease, unspecified: Secondary | ICD-10-CM | POA: Diagnosis not present

## 2019-11-06 DIAGNOSIS — R531 Weakness: Secondary | ICD-10-CM | POA: Diagnosis not present

## 2019-11-08 DIAGNOSIS — J9611 Chronic respiratory failure with hypoxia: Secondary | ICD-10-CM | POA: Diagnosis not present

## 2019-11-08 DIAGNOSIS — R531 Weakness: Secondary | ICD-10-CM | POA: Diagnosis not present

## 2019-11-08 DIAGNOSIS — E039 Hypothyroidism, unspecified: Secondary | ICD-10-CM | POA: Diagnosis not present

## 2019-11-08 DIAGNOSIS — J449 Chronic obstructive pulmonary disease, unspecified: Secondary | ICD-10-CM | POA: Diagnosis not present

## 2019-11-08 DIAGNOSIS — E43 Unspecified severe protein-calorie malnutrition: Secondary | ICD-10-CM | POA: Diagnosis not present

## 2019-11-08 DIAGNOSIS — I5033 Acute on chronic diastolic (congestive) heart failure: Secondary | ICD-10-CM | POA: Diagnosis not present

## 2019-11-10 DIAGNOSIS — J9611 Chronic respiratory failure with hypoxia: Secondary | ICD-10-CM | POA: Diagnosis not present

## 2019-11-10 DIAGNOSIS — E43 Unspecified severe protein-calorie malnutrition: Secondary | ICD-10-CM | POA: Diagnosis not present

## 2019-11-10 DIAGNOSIS — R531 Weakness: Secondary | ICD-10-CM | POA: Diagnosis not present

## 2019-11-10 DIAGNOSIS — I5033 Acute on chronic diastolic (congestive) heart failure: Secondary | ICD-10-CM | POA: Diagnosis not present

## 2019-11-10 DIAGNOSIS — J449 Chronic obstructive pulmonary disease, unspecified: Secondary | ICD-10-CM | POA: Diagnosis not present

## 2019-11-10 DIAGNOSIS — E039 Hypothyroidism, unspecified: Secondary | ICD-10-CM | POA: Diagnosis not present

## 2019-11-13 DIAGNOSIS — J449 Chronic obstructive pulmonary disease, unspecified: Secondary | ICD-10-CM | POA: Diagnosis not present

## 2019-11-13 DIAGNOSIS — R531 Weakness: Secondary | ICD-10-CM | POA: Diagnosis not present

## 2019-11-13 DIAGNOSIS — I5033 Acute on chronic diastolic (congestive) heart failure: Secondary | ICD-10-CM | POA: Diagnosis not present

## 2019-11-13 DIAGNOSIS — E43 Unspecified severe protein-calorie malnutrition: Secondary | ICD-10-CM | POA: Diagnosis not present

## 2019-11-13 DIAGNOSIS — E039 Hypothyroidism, unspecified: Secondary | ICD-10-CM | POA: Diagnosis not present

## 2019-11-13 DIAGNOSIS — J9611 Chronic respiratory failure with hypoxia: Secondary | ICD-10-CM | POA: Diagnosis not present

## 2019-11-15 DIAGNOSIS — I5033 Acute on chronic diastolic (congestive) heart failure: Secondary | ICD-10-CM | POA: Diagnosis not present

## 2019-11-15 DIAGNOSIS — E039 Hypothyroidism, unspecified: Secondary | ICD-10-CM | POA: Diagnosis not present

## 2019-11-15 DIAGNOSIS — J449 Chronic obstructive pulmonary disease, unspecified: Secondary | ICD-10-CM | POA: Diagnosis not present

## 2019-11-15 DIAGNOSIS — J9611 Chronic respiratory failure with hypoxia: Secondary | ICD-10-CM | POA: Diagnosis not present

## 2019-11-15 DIAGNOSIS — E43 Unspecified severe protein-calorie malnutrition: Secondary | ICD-10-CM | POA: Diagnosis not present

## 2019-11-15 DIAGNOSIS — R531 Weakness: Secondary | ICD-10-CM | POA: Diagnosis not present

## 2019-11-17 DIAGNOSIS — E039 Hypothyroidism, unspecified: Secondary | ICD-10-CM | POA: Diagnosis not present

## 2019-11-17 DIAGNOSIS — E43 Unspecified severe protein-calorie malnutrition: Secondary | ICD-10-CM | POA: Diagnosis not present

## 2019-11-17 DIAGNOSIS — J9611 Chronic respiratory failure with hypoxia: Secondary | ICD-10-CM | POA: Diagnosis not present

## 2019-11-17 DIAGNOSIS — I5033 Acute on chronic diastolic (congestive) heart failure: Secondary | ICD-10-CM | POA: Diagnosis not present

## 2019-11-17 DIAGNOSIS — R531 Weakness: Secondary | ICD-10-CM | POA: Diagnosis not present

## 2019-11-17 DIAGNOSIS — J449 Chronic obstructive pulmonary disease, unspecified: Secondary | ICD-10-CM | POA: Diagnosis not present

## 2019-11-20 DIAGNOSIS — J9611 Chronic respiratory failure with hypoxia: Secondary | ICD-10-CM | POA: Diagnosis not present

## 2019-11-20 DIAGNOSIS — J449 Chronic obstructive pulmonary disease, unspecified: Secondary | ICD-10-CM | POA: Diagnosis not present

## 2019-11-20 DIAGNOSIS — R531 Weakness: Secondary | ICD-10-CM | POA: Diagnosis not present

## 2019-11-20 DIAGNOSIS — I5033 Acute on chronic diastolic (congestive) heart failure: Secondary | ICD-10-CM | POA: Diagnosis not present

## 2019-11-20 DIAGNOSIS — E43 Unspecified severe protein-calorie malnutrition: Secondary | ICD-10-CM | POA: Diagnosis not present

## 2019-11-20 DIAGNOSIS — E039 Hypothyroidism, unspecified: Secondary | ICD-10-CM | POA: Diagnosis not present

## 2019-11-22 DIAGNOSIS — I5033 Acute on chronic diastolic (congestive) heart failure: Secondary | ICD-10-CM | POA: Diagnosis not present

## 2019-11-22 DIAGNOSIS — J9611 Chronic respiratory failure with hypoxia: Secondary | ICD-10-CM | POA: Diagnosis not present

## 2019-11-22 DIAGNOSIS — E43 Unspecified severe protein-calorie malnutrition: Secondary | ICD-10-CM | POA: Diagnosis not present

## 2019-11-22 DIAGNOSIS — Z23 Encounter for immunization: Secondary | ICD-10-CM | POA: Diagnosis not present

## 2019-11-22 DIAGNOSIS — E039 Hypothyroidism, unspecified: Secondary | ICD-10-CM | POA: Diagnosis not present

## 2019-11-22 DIAGNOSIS — R531 Weakness: Secondary | ICD-10-CM | POA: Diagnosis not present

## 2019-11-22 DIAGNOSIS — J449 Chronic obstructive pulmonary disease, unspecified: Secondary | ICD-10-CM | POA: Diagnosis not present

## 2019-11-24 DIAGNOSIS — E43 Unspecified severe protein-calorie malnutrition: Secondary | ICD-10-CM | POA: Diagnosis not present

## 2019-11-24 DIAGNOSIS — J9611 Chronic respiratory failure with hypoxia: Secondary | ICD-10-CM | POA: Diagnosis not present

## 2019-11-24 DIAGNOSIS — R531 Weakness: Secondary | ICD-10-CM | POA: Diagnosis not present

## 2019-11-24 DIAGNOSIS — J449 Chronic obstructive pulmonary disease, unspecified: Secondary | ICD-10-CM | POA: Diagnosis not present

## 2019-11-24 DIAGNOSIS — I5033 Acute on chronic diastolic (congestive) heart failure: Secondary | ICD-10-CM | POA: Diagnosis not present

## 2019-11-24 DIAGNOSIS — E039 Hypothyroidism, unspecified: Secondary | ICD-10-CM | POA: Diagnosis not present

## 2019-11-27 DIAGNOSIS — I5033 Acute on chronic diastolic (congestive) heart failure: Secondary | ICD-10-CM | POA: Diagnosis not present

## 2019-11-27 DIAGNOSIS — E039 Hypothyroidism, unspecified: Secondary | ICD-10-CM | POA: Diagnosis not present

## 2019-11-27 DIAGNOSIS — J449 Chronic obstructive pulmonary disease, unspecified: Secondary | ICD-10-CM | POA: Diagnosis not present

## 2019-11-27 DIAGNOSIS — R531 Weakness: Secondary | ICD-10-CM | POA: Diagnosis not present

## 2019-11-27 DIAGNOSIS — E43 Unspecified severe protein-calorie malnutrition: Secondary | ICD-10-CM | POA: Diagnosis not present

## 2019-11-27 DIAGNOSIS — J9611 Chronic respiratory failure with hypoxia: Secondary | ICD-10-CM | POA: Diagnosis not present

## 2019-11-29 DIAGNOSIS — I5033 Acute on chronic diastolic (congestive) heart failure: Secondary | ICD-10-CM | POA: Diagnosis not present

## 2019-11-29 DIAGNOSIS — J9611 Chronic respiratory failure with hypoxia: Secondary | ICD-10-CM | POA: Diagnosis not present

## 2019-11-29 DIAGNOSIS — E43 Unspecified severe protein-calorie malnutrition: Secondary | ICD-10-CM | POA: Diagnosis not present

## 2019-11-29 DIAGNOSIS — J449 Chronic obstructive pulmonary disease, unspecified: Secondary | ICD-10-CM | POA: Diagnosis not present

## 2019-11-29 DIAGNOSIS — E039 Hypothyroidism, unspecified: Secondary | ICD-10-CM | POA: Diagnosis not present

## 2019-11-29 DIAGNOSIS — R531 Weakness: Secondary | ICD-10-CM | POA: Diagnosis not present

## 2019-12-01 DIAGNOSIS — E43 Unspecified severe protein-calorie malnutrition: Secondary | ICD-10-CM | POA: Diagnosis not present

## 2019-12-01 DIAGNOSIS — J449 Chronic obstructive pulmonary disease, unspecified: Secondary | ICD-10-CM | POA: Diagnosis not present

## 2019-12-01 DIAGNOSIS — I5033 Acute on chronic diastolic (congestive) heart failure: Secondary | ICD-10-CM | POA: Diagnosis not present

## 2019-12-01 DIAGNOSIS — R531 Weakness: Secondary | ICD-10-CM | POA: Diagnosis not present

## 2019-12-01 DIAGNOSIS — E039 Hypothyroidism, unspecified: Secondary | ICD-10-CM | POA: Diagnosis not present

## 2019-12-01 DIAGNOSIS — J9611 Chronic respiratory failure with hypoxia: Secondary | ICD-10-CM | POA: Diagnosis not present

## 2019-12-02 DIAGNOSIS — J9611 Chronic respiratory failure with hypoxia: Secondary | ICD-10-CM | POA: Diagnosis not present

## 2019-12-02 DIAGNOSIS — R0600 Dyspnea, unspecified: Secondary | ICD-10-CM | POA: Diagnosis not present

## 2019-12-02 DIAGNOSIS — E039 Hypothyroidism, unspecified: Secondary | ICD-10-CM | POA: Diagnosis not present

## 2019-12-02 DIAGNOSIS — I5033 Acute on chronic diastolic (congestive) heart failure: Secondary | ICD-10-CM | POA: Diagnosis not present

## 2019-12-02 DIAGNOSIS — E43 Unspecified severe protein-calorie malnutrition: Secondary | ICD-10-CM | POA: Diagnosis not present

## 2019-12-02 DIAGNOSIS — R531 Weakness: Secondary | ICD-10-CM | POA: Diagnosis not present

## 2019-12-02 DIAGNOSIS — R0602 Shortness of breath: Secondary | ICD-10-CM | POA: Diagnosis not present

## 2019-12-02 DIAGNOSIS — J449 Chronic obstructive pulmonary disease, unspecified: Secondary | ICD-10-CM | POA: Diagnosis not present

## 2019-12-02 DIAGNOSIS — J918 Pleural effusion in other conditions classified elsewhere: Secondary | ICD-10-CM | POA: Diagnosis not present

## 2019-12-02 DIAGNOSIS — Z9981 Dependence on supplemental oxygen: Secondary | ICD-10-CM | POA: Diagnosis not present

## 2019-12-02 DIAGNOSIS — I4891 Unspecified atrial fibrillation: Secondary | ICD-10-CM | POA: Diagnosis not present

## 2019-12-02 DIAGNOSIS — I5032 Chronic diastolic (congestive) heart failure: Secondary | ICD-10-CM | POA: Diagnosis not present

## 2019-12-04 DIAGNOSIS — R531 Weakness: Secondary | ICD-10-CM | POA: Diagnosis not present

## 2019-12-04 DIAGNOSIS — J9611 Chronic respiratory failure with hypoxia: Secondary | ICD-10-CM | POA: Diagnosis not present

## 2019-12-04 DIAGNOSIS — J449 Chronic obstructive pulmonary disease, unspecified: Secondary | ICD-10-CM | POA: Diagnosis not present

## 2019-12-04 DIAGNOSIS — E43 Unspecified severe protein-calorie malnutrition: Secondary | ICD-10-CM | POA: Diagnosis not present

## 2019-12-04 DIAGNOSIS — I5033 Acute on chronic diastolic (congestive) heart failure: Secondary | ICD-10-CM | POA: Diagnosis not present

## 2019-12-04 DIAGNOSIS — E039 Hypothyroidism, unspecified: Secondary | ICD-10-CM | POA: Diagnosis not present

## 2019-12-06 DIAGNOSIS — J9611 Chronic respiratory failure with hypoxia: Secondary | ICD-10-CM | POA: Diagnosis not present

## 2019-12-06 DIAGNOSIS — E039 Hypothyroidism, unspecified: Secondary | ICD-10-CM | POA: Diagnosis not present

## 2019-12-06 DIAGNOSIS — I5033 Acute on chronic diastolic (congestive) heart failure: Secondary | ICD-10-CM | POA: Diagnosis not present

## 2019-12-06 DIAGNOSIS — J449 Chronic obstructive pulmonary disease, unspecified: Secondary | ICD-10-CM | POA: Diagnosis not present

## 2019-12-06 DIAGNOSIS — E43 Unspecified severe protein-calorie malnutrition: Secondary | ICD-10-CM | POA: Diagnosis not present

## 2019-12-06 DIAGNOSIS — R531 Weakness: Secondary | ICD-10-CM | POA: Diagnosis not present

## 2019-12-08 DIAGNOSIS — I5033 Acute on chronic diastolic (congestive) heart failure: Secondary | ICD-10-CM | POA: Diagnosis not present

## 2019-12-08 DIAGNOSIS — J449 Chronic obstructive pulmonary disease, unspecified: Secondary | ICD-10-CM | POA: Diagnosis not present

## 2019-12-08 DIAGNOSIS — E43 Unspecified severe protein-calorie malnutrition: Secondary | ICD-10-CM | POA: Diagnosis not present

## 2019-12-08 DIAGNOSIS — R531 Weakness: Secondary | ICD-10-CM | POA: Diagnosis not present

## 2019-12-08 DIAGNOSIS — J9611 Chronic respiratory failure with hypoxia: Secondary | ICD-10-CM | POA: Diagnosis not present

## 2019-12-08 DIAGNOSIS — E039 Hypothyroidism, unspecified: Secondary | ICD-10-CM | POA: Diagnosis not present

## 2019-12-11 DIAGNOSIS — I5033 Acute on chronic diastolic (congestive) heart failure: Secondary | ICD-10-CM | POA: Diagnosis not present

## 2019-12-11 DIAGNOSIS — E43 Unspecified severe protein-calorie malnutrition: Secondary | ICD-10-CM | POA: Diagnosis not present

## 2019-12-11 DIAGNOSIS — J9611 Chronic respiratory failure with hypoxia: Secondary | ICD-10-CM | POA: Diagnosis not present

## 2019-12-11 DIAGNOSIS — E039 Hypothyroidism, unspecified: Secondary | ICD-10-CM | POA: Diagnosis not present

## 2019-12-11 DIAGNOSIS — J449 Chronic obstructive pulmonary disease, unspecified: Secondary | ICD-10-CM | POA: Diagnosis not present

## 2019-12-11 DIAGNOSIS — R531 Weakness: Secondary | ICD-10-CM | POA: Diagnosis not present

## 2019-12-13 DIAGNOSIS — I5033 Acute on chronic diastolic (congestive) heart failure: Secondary | ICD-10-CM | POA: Diagnosis not present

## 2019-12-13 DIAGNOSIS — E43 Unspecified severe protein-calorie malnutrition: Secondary | ICD-10-CM | POA: Diagnosis not present

## 2019-12-13 DIAGNOSIS — R531 Weakness: Secondary | ICD-10-CM | POA: Diagnosis not present

## 2019-12-13 DIAGNOSIS — J449 Chronic obstructive pulmonary disease, unspecified: Secondary | ICD-10-CM | POA: Diagnosis not present

## 2019-12-13 DIAGNOSIS — J9611 Chronic respiratory failure with hypoxia: Secondary | ICD-10-CM | POA: Diagnosis not present

## 2019-12-13 DIAGNOSIS — E039 Hypothyroidism, unspecified: Secondary | ICD-10-CM | POA: Diagnosis not present

## 2019-12-15 DIAGNOSIS — J449 Chronic obstructive pulmonary disease, unspecified: Secondary | ICD-10-CM | POA: Diagnosis not present

## 2019-12-15 DIAGNOSIS — I5033 Acute on chronic diastolic (congestive) heart failure: Secondary | ICD-10-CM | POA: Diagnosis not present

## 2019-12-15 DIAGNOSIS — R531 Weakness: Secondary | ICD-10-CM | POA: Diagnosis not present

## 2019-12-15 DIAGNOSIS — E039 Hypothyroidism, unspecified: Secondary | ICD-10-CM | POA: Diagnosis not present

## 2019-12-15 DIAGNOSIS — J9611 Chronic respiratory failure with hypoxia: Secondary | ICD-10-CM | POA: Diagnosis not present

## 2019-12-15 DIAGNOSIS — Z23 Encounter for immunization: Secondary | ICD-10-CM | POA: Diagnosis not present

## 2019-12-15 DIAGNOSIS — E43 Unspecified severe protein-calorie malnutrition: Secondary | ICD-10-CM | POA: Diagnosis not present

## 2019-12-18 DIAGNOSIS — I5033 Acute on chronic diastolic (congestive) heart failure: Secondary | ICD-10-CM | POA: Diagnosis not present

## 2019-12-18 DIAGNOSIS — E039 Hypothyroidism, unspecified: Secondary | ICD-10-CM | POA: Diagnosis not present

## 2019-12-18 DIAGNOSIS — J9611 Chronic respiratory failure with hypoxia: Secondary | ICD-10-CM | POA: Diagnosis not present

## 2019-12-18 DIAGNOSIS — E43 Unspecified severe protein-calorie malnutrition: Secondary | ICD-10-CM | POA: Diagnosis not present

## 2019-12-18 DIAGNOSIS — J449 Chronic obstructive pulmonary disease, unspecified: Secondary | ICD-10-CM | POA: Diagnosis not present

## 2019-12-18 DIAGNOSIS — R531 Weakness: Secondary | ICD-10-CM | POA: Diagnosis not present

## 2019-12-20 DIAGNOSIS — E039 Hypothyroidism, unspecified: Secondary | ICD-10-CM | POA: Diagnosis not present

## 2019-12-20 DIAGNOSIS — J449 Chronic obstructive pulmonary disease, unspecified: Secondary | ICD-10-CM | POA: Diagnosis not present

## 2019-12-20 DIAGNOSIS — R531 Weakness: Secondary | ICD-10-CM | POA: Diagnosis not present

## 2019-12-20 DIAGNOSIS — E43 Unspecified severe protein-calorie malnutrition: Secondary | ICD-10-CM | POA: Diagnosis not present

## 2019-12-20 DIAGNOSIS — J9611 Chronic respiratory failure with hypoxia: Secondary | ICD-10-CM | POA: Diagnosis not present

## 2019-12-20 DIAGNOSIS — I5033 Acute on chronic diastolic (congestive) heart failure: Secondary | ICD-10-CM | POA: Diagnosis not present

## 2019-12-21 DIAGNOSIS — I5033 Acute on chronic diastolic (congestive) heart failure: Secondary | ICD-10-CM | POA: Diagnosis not present

## 2019-12-21 DIAGNOSIS — E43 Unspecified severe protein-calorie malnutrition: Secondary | ICD-10-CM | POA: Diagnosis not present

## 2019-12-21 DIAGNOSIS — J449 Chronic obstructive pulmonary disease, unspecified: Secondary | ICD-10-CM | POA: Diagnosis not present

## 2019-12-21 DIAGNOSIS — J9611 Chronic respiratory failure with hypoxia: Secondary | ICD-10-CM | POA: Diagnosis not present

## 2019-12-21 DIAGNOSIS — E039 Hypothyroidism, unspecified: Secondary | ICD-10-CM | POA: Diagnosis not present

## 2019-12-21 DIAGNOSIS — R531 Weakness: Secondary | ICD-10-CM | POA: Diagnosis not present

## 2019-12-25 DIAGNOSIS — J449 Chronic obstructive pulmonary disease, unspecified: Secondary | ICD-10-CM | POA: Diagnosis not present

## 2019-12-25 DIAGNOSIS — J9611 Chronic respiratory failure with hypoxia: Secondary | ICD-10-CM | POA: Diagnosis not present

## 2019-12-25 DIAGNOSIS — E039 Hypothyroidism, unspecified: Secondary | ICD-10-CM | POA: Diagnosis not present

## 2019-12-25 DIAGNOSIS — E43 Unspecified severe protein-calorie malnutrition: Secondary | ICD-10-CM | POA: Diagnosis not present

## 2019-12-25 DIAGNOSIS — I5033 Acute on chronic diastolic (congestive) heart failure: Secondary | ICD-10-CM | POA: Diagnosis not present

## 2019-12-25 DIAGNOSIS — R531 Weakness: Secondary | ICD-10-CM | POA: Diagnosis not present

## 2019-12-27 DIAGNOSIS — E039 Hypothyroidism, unspecified: Secondary | ICD-10-CM | POA: Diagnosis not present

## 2019-12-27 DIAGNOSIS — J449 Chronic obstructive pulmonary disease, unspecified: Secondary | ICD-10-CM | POA: Diagnosis not present

## 2019-12-27 DIAGNOSIS — I5033 Acute on chronic diastolic (congestive) heart failure: Secondary | ICD-10-CM | POA: Diagnosis not present

## 2019-12-27 DIAGNOSIS — J9611 Chronic respiratory failure with hypoxia: Secondary | ICD-10-CM | POA: Diagnosis not present

## 2019-12-27 DIAGNOSIS — E43 Unspecified severe protein-calorie malnutrition: Secondary | ICD-10-CM | POA: Diagnosis not present

## 2019-12-27 DIAGNOSIS — R531 Weakness: Secondary | ICD-10-CM | POA: Diagnosis not present

## 2019-12-29 DIAGNOSIS — E43 Unspecified severe protein-calorie malnutrition: Secondary | ICD-10-CM | POA: Diagnosis not present

## 2019-12-29 DIAGNOSIS — R531 Weakness: Secondary | ICD-10-CM | POA: Diagnosis not present

## 2019-12-29 DIAGNOSIS — J9611 Chronic respiratory failure with hypoxia: Secondary | ICD-10-CM | POA: Diagnosis not present

## 2019-12-29 DIAGNOSIS — E039 Hypothyroidism, unspecified: Secondary | ICD-10-CM | POA: Diagnosis not present

## 2019-12-29 DIAGNOSIS — I5033 Acute on chronic diastolic (congestive) heart failure: Secondary | ICD-10-CM | POA: Diagnosis not present

## 2019-12-29 DIAGNOSIS — J449 Chronic obstructive pulmonary disease, unspecified: Secondary | ICD-10-CM | POA: Diagnosis not present

## 2020-01-01 DIAGNOSIS — E43 Unspecified severe protein-calorie malnutrition: Secondary | ICD-10-CM | POA: Diagnosis not present

## 2020-01-01 DIAGNOSIS — R531 Weakness: Secondary | ICD-10-CM | POA: Diagnosis not present

## 2020-01-01 DIAGNOSIS — J449 Chronic obstructive pulmonary disease, unspecified: Secondary | ICD-10-CM | POA: Diagnosis not present

## 2020-01-01 DIAGNOSIS — E039 Hypothyroidism, unspecified: Secondary | ICD-10-CM | POA: Diagnosis not present

## 2020-01-01 DIAGNOSIS — J9611 Chronic respiratory failure with hypoxia: Secondary | ICD-10-CM | POA: Diagnosis not present

## 2020-01-01 DIAGNOSIS — I5033 Acute on chronic diastolic (congestive) heart failure: Secondary | ICD-10-CM | POA: Diagnosis not present

## 2020-01-02 DIAGNOSIS — I5033 Acute on chronic diastolic (congestive) heart failure: Secondary | ICD-10-CM | POA: Diagnosis not present

## 2020-01-02 DIAGNOSIS — R531 Weakness: Secondary | ICD-10-CM | POA: Diagnosis not present

## 2020-01-02 DIAGNOSIS — Z9981 Dependence on supplemental oxygen: Secondary | ICD-10-CM | POA: Diagnosis not present

## 2020-01-02 DIAGNOSIS — J9611 Chronic respiratory failure with hypoxia: Secondary | ICD-10-CM | POA: Diagnosis not present

## 2020-01-02 DIAGNOSIS — E039 Hypothyroidism, unspecified: Secondary | ICD-10-CM | POA: Diagnosis not present

## 2020-01-02 DIAGNOSIS — I4891 Unspecified atrial fibrillation: Secondary | ICD-10-CM | POA: Diagnosis not present

## 2020-01-02 DIAGNOSIS — R0600 Dyspnea, unspecified: Secondary | ICD-10-CM | POA: Diagnosis not present

## 2020-01-02 DIAGNOSIS — R0602 Shortness of breath: Secondary | ICD-10-CM | POA: Diagnosis not present

## 2020-01-02 DIAGNOSIS — J449 Chronic obstructive pulmonary disease, unspecified: Secondary | ICD-10-CM | POA: Diagnosis not present

## 2020-01-02 DIAGNOSIS — E43 Unspecified severe protein-calorie malnutrition: Secondary | ICD-10-CM | POA: Diagnosis not present

## 2020-01-02 DIAGNOSIS — J918 Pleural effusion in other conditions classified elsewhere: Secondary | ICD-10-CM | POA: Diagnosis not present

## 2020-01-02 DIAGNOSIS — I5032 Chronic diastolic (congestive) heart failure: Secondary | ICD-10-CM | POA: Diagnosis not present

## 2020-01-03 DIAGNOSIS — E039 Hypothyroidism, unspecified: Secondary | ICD-10-CM | POA: Diagnosis not present

## 2020-01-03 DIAGNOSIS — E43 Unspecified severe protein-calorie malnutrition: Secondary | ICD-10-CM | POA: Diagnosis not present

## 2020-01-03 DIAGNOSIS — J9611 Chronic respiratory failure with hypoxia: Secondary | ICD-10-CM | POA: Diagnosis not present

## 2020-01-03 DIAGNOSIS — J449 Chronic obstructive pulmonary disease, unspecified: Secondary | ICD-10-CM | POA: Diagnosis not present

## 2020-01-03 DIAGNOSIS — R531 Weakness: Secondary | ICD-10-CM | POA: Diagnosis not present

## 2020-01-03 DIAGNOSIS — I5033 Acute on chronic diastolic (congestive) heart failure: Secondary | ICD-10-CM | POA: Diagnosis not present

## 2020-01-05 DIAGNOSIS — J449 Chronic obstructive pulmonary disease, unspecified: Secondary | ICD-10-CM | POA: Diagnosis not present

## 2020-01-05 DIAGNOSIS — I5033 Acute on chronic diastolic (congestive) heart failure: Secondary | ICD-10-CM | POA: Diagnosis not present

## 2020-01-05 DIAGNOSIS — R531 Weakness: Secondary | ICD-10-CM | POA: Diagnosis not present

## 2020-01-05 DIAGNOSIS — J9611 Chronic respiratory failure with hypoxia: Secondary | ICD-10-CM | POA: Diagnosis not present

## 2020-01-05 DIAGNOSIS — E039 Hypothyroidism, unspecified: Secondary | ICD-10-CM | POA: Diagnosis not present

## 2020-01-05 DIAGNOSIS — E43 Unspecified severe protein-calorie malnutrition: Secondary | ICD-10-CM | POA: Diagnosis not present

## 2020-01-08 DIAGNOSIS — I5033 Acute on chronic diastolic (congestive) heart failure: Secondary | ICD-10-CM | POA: Diagnosis not present

## 2020-01-08 DIAGNOSIS — E43 Unspecified severe protein-calorie malnutrition: Secondary | ICD-10-CM | POA: Diagnosis not present

## 2020-01-08 DIAGNOSIS — R531 Weakness: Secondary | ICD-10-CM | POA: Diagnosis not present

## 2020-01-08 DIAGNOSIS — J449 Chronic obstructive pulmonary disease, unspecified: Secondary | ICD-10-CM | POA: Diagnosis not present

## 2020-01-08 DIAGNOSIS — E039 Hypothyroidism, unspecified: Secondary | ICD-10-CM | POA: Diagnosis not present

## 2020-01-08 DIAGNOSIS — J9611 Chronic respiratory failure with hypoxia: Secondary | ICD-10-CM | POA: Diagnosis not present

## 2020-01-09 DIAGNOSIS — R531 Weakness: Secondary | ICD-10-CM | POA: Diagnosis not present

## 2020-01-09 DIAGNOSIS — E43 Unspecified severe protein-calorie malnutrition: Secondary | ICD-10-CM | POA: Diagnosis not present

## 2020-01-09 DIAGNOSIS — E039 Hypothyroidism, unspecified: Secondary | ICD-10-CM | POA: Diagnosis not present

## 2020-01-09 DIAGNOSIS — I5033 Acute on chronic diastolic (congestive) heart failure: Secondary | ICD-10-CM | POA: Diagnosis not present

## 2020-01-09 DIAGNOSIS — J9611 Chronic respiratory failure with hypoxia: Secondary | ICD-10-CM | POA: Diagnosis not present

## 2020-01-09 DIAGNOSIS — J449 Chronic obstructive pulmonary disease, unspecified: Secondary | ICD-10-CM | POA: Diagnosis not present

## 2020-01-10 DIAGNOSIS — E43 Unspecified severe protein-calorie malnutrition: Secondary | ICD-10-CM | POA: Diagnosis not present

## 2020-01-10 DIAGNOSIS — R531 Weakness: Secondary | ICD-10-CM | POA: Diagnosis not present

## 2020-01-10 DIAGNOSIS — I5033 Acute on chronic diastolic (congestive) heart failure: Secondary | ICD-10-CM | POA: Diagnosis not present

## 2020-01-10 DIAGNOSIS — E039 Hypothyroidism, unspecified: Secondary | ICD-10-CM | POA: Diagnosis not present

## 2020-01-10 DIAGNOSIS — J449 Chronic obstructive pulmonary disease, unspecified: Secondary | ICD-10-CM | POA: Diagnosis not present

## 2020-01-10 DIAGNOSIS — J9611 Chronic respiratory failure with hypoxia: Secondary | ICD-10-CM | POA: Diagnosis not present

## 2020-01-12 DIAGNOSIS — J9611 Chronic respiratory failure with hypoxia: Secondary | ICD-10-CM | POA: Diagnosis not present

## 2020-01-12 DIAGNOSIS — E43 Unspecified severe protein-calorie malnutrition: Secondary | ICD-10-CM | POA: Diagnosis not present

## 2020-01-12 DIAGNOSIS — R531 Weakness: Secondary | ICD-10-CM | POA: Diagnosis not present

## 2020-01-12 DIAGNOSIS — E039 Hypothyroidism, unspecified: Secondary | ICD-10-CM | POA: Diagnosis not present

## 2020-01-12 DIAGNOSIS — I5033 Acute on chronic diastolic (congestive) heart failure: Secondary | ICD-10-CM | POA: Diagnosis not present

## 2020-01-12 DIAGNOSIS — J449 Chronic obstructive pulmonary disease, unspecified: Secondary | ICD-10-CM | POA: Diagnosis not present

## 2020-01-15 DIAGNOSIS — E43 Unspecified severe protein-calorie malnutrition: Secondary | ICD-10-CM | POA: Diagnosis not present

## 2020-01-15 DIAGNOSIS — E039 Hypothyroidism, unspecified: Secondary | ICD-10-CM | POA: Diagnosis not present

## 2020-01-15 DIAGNOSIS — R531 Weakness: Secondary | ICD-10-CM | POA: Diagnosis not present

## 2020-01-15 DIAGNOSIS — J9611 Chronic respiratory failure with hypoxia: Secondary | ICD-10-CM | POA: Diagnosis not present

## 2020-01-15 DIAGNOSIS — I5033 Acute on chronic diastolic (congestive) heart failure: Secondary | ICD-10-CM | POA: Diagnosis not present

## 2020-01-15 DIAGNOSIS — J449 Chronic obstructive pulmonary disease, unspecified: Secondary | ICD-10-CM | POA: Diagnosis not present

## 2020-01-17 DIAGNOSIS — I5033 Acute on chronic diastolic (congestive) heart failure: Secondary | ICD-10-CM | POA: Diagnosis not present

## 2020-01-17 DIAGNOSIS — E039 Hypothyroidism, unspecified: Secondary | ICD-10-CM | POA: Diagnosis not present

## 2020-01-17 DIAGNOSIS — E43 Unspecified severe protein-calorie malnutrition: Secondary | ICD-10-CM | POA: Diagnosis not present

## 2020-01-17 DIAGNOSIS — J9611 Chronic respiratory failure with hypoxia: Secondary | ICD-10-CM | POA: Diagnosis not present

## 2020-01-17 DIAGNOSIS — R531 Weakness: Secondary | ICD-10-CM | POA: Diagnosis not present

## 2020-01-17 DIAGNOSIS — J449 Chronic obstructive pulmonary disease, unspecified: Secondary | ICD-10-CM | POA: Diagnosis not present

## 2020-01-19 DIAGNOSIS — J9611 Chronic respiratory failure with hypoxia: Secondary | ICD-10-CM | POA: Diagnosis not present

## 2020-01-19 DIAGNOSIS — E039 Hypothyroidism, unspecified: Secondary | ICD-10-CM | POA: Diagnosis not present

## 2020-01-19 DIAGNOSIS — R531 Weakness: Secondary | ICD-10-CM | POA: Diagnosis not present

## 2020-01-19 DIAGNOSIS — J449 Chronic obstructive pulmonary disease, unspecified: Secondary | ICD-10-CM | POA: Diagnosis not present

## 2020-01-19 DIAGNOSIS — I5033 Acute on chronic diastolic (congestive) heart failure: Secondary | ICD-10-CM | POA: Diagnosis not present

## 2020-01-19 DIAGNOSIS — E43 Unspecified severe protein-calorie malnutrition: Secondary | ICD-10-CM | POA: Diagnosis not present

## 2020-01-22 DIAGNOSIS — I5033 Acute on chronic diastolic (congestive) heart failure: Secondary | ICD-10-CM | POA: Diagnosis not present

## 2020-01-22 DIAGNOSIS — E039 Hypothyroidism, unspecified: Secondary | ICD-10-CM | POA: Diagnosis not present

## 2020-01-22 DIAGNOSIS — J9611 Chronic respiratory failure with hypoxia: Secondary | ICD-10-CM | POA: Diagnosis not present

## 2020-01-22 DIAGNOSIS — J449 Chronic obstructive pulmonary disease, unspecified: Secondary | ICD-10-CM | POA: Diagnosis not present

## 2020-01-22 DIAGNOSIS — E43 Unspecified severe protein-calorie malnutrition: Secondary | ICD-10-CM | POA: Diagnosis not present

## 2020-01-22 DIAGNOSIS — R531 Weakness: Secondary | ICD-10-CM | POA: Diagnosis not present

## 2020-01-24 DIAGNOSIS — R531 Weakness: Secondary | ICD-10-CM | POA: Diagnosis not present

## 2020-01-24 DIAGNOSIS — E039 Hypothyroidism, unspecified: Secondary | ICD-10-CM | POA: Diagnosis not present

## 2020-01-24 DIAGNOSIS — E43 Unspecified severe protein-calorie malnutrition: Secondary | ICD-10-CM | POA: Diagnosis not present

## 2020-01-24 DIAGNOSIS — I5033 Acute on chronic diastolic (congestive) heart failure: Secondary | ICD-10-CM | POA: Diagnosis not present

## 2020-01-24 DIAGNOSIS — J449 Chronic obstructive pulmonary disease, unspecified: Secondary | ICD-10-CM | POA: Diagnosis not present

## 2020-01-24 DIAGNOSIS — J9611 Chronic respiratory failure with hypoxia: Secondary | ICD-10-CM | POA: Diagnosis not present

## 2020-01-26 DIAGNOSIS — I5033 Acute on chronic diastolic (congestive) heart failure: Secondary | ICD-10-CM | POA: Diagnosis not present

## 2020-01-26 DIAGNOSIS — R531 Weakness: Secondary | ICD-10-CM | POA: Diagnosis not present

## 2020-01-26 DIAGNOSIS — E43 Unspecified severe protein-calorie malnutrition: Secondary | ICD-10-CM | POA: Diagnosis not present

## 2020-01-26 DIAGNOSIS — E039 Hypothyroidism, unspecified: Secondary | ICD-10-CM | POA: Diagnosis not present

## 2020-01-26 DIAGNOSIS — J449 Chronic obstructive pulmonary disease, unspecified: Secondary | ICD-10-CM | POA: Diagnosis not present

## 2020-01-26 DIAGNOSIS — J9611 Chronic respiratory failure with hypoxia: Secondary | ICD-10-CM | POA: Diagnosis not present

## 2020-01-29 DIAGNOSIS — J9611 Chronic respiratory failure with hypoxia: Secondary | ICD-10-CM | POA: Diagnosis not present

## 2020-01-29 DIAGNOSIS — J449 Chronic obstructive pulmonary disease, unspecified: Secondary | ICD-10-CM | POA: Diagnosis not present

## 2020-01-29 DIAGNOSIS — E039 Hypothyroidism, unspecified: Secondary | ICD-10-CM | POA: Diagnosis not present

## 2020-01-29 DIAGNOSIS — E43 Unspecified severe protein-calorie malnutrition: Secondary | ICD-10-CM | POA: Diagnosis not present

## 2020-01-29 DIAGNOSIS — I5033 Acute on chronic diastolic (congestive) heart failure: Secondary | ICD-10-CM | POA: Diagnosis not present

## 2020-01-29 DIAGNOSIS — R531 Weakness: Secondary | ICD-10-CM | POA: Diagnosis not present

## 2020-01-30 DIAGNOSIS — E039 Hypothyroidism, unspecified: Secondary | ICD-10-CM | POA: Diagnosis not present

## 2020-01-30 DIAGNOSIS — J449 Chronic obstructive pulmonary disease, unspecified: Secondary | ICD-10-CM | POA: Diagnosis not present

## 2020-01-30 DIAGNOSIS — J9611 Chronic respiratory failure with hypoxia: Secondary | ICD-10-CM | POA: Diagnosis not present

## 2020-01-30 DIAGNOSIS — I5033 Acute on chronic diastolic (congestive) heart failure: Secondary | ICD-10-CM | POA: Diagnosis not present

## 2020-01-30 DIAGNOSIS — E43 Unspecified severe protein-calorie malnutrition: Secondary | ICD-10-CM | POA: Diagnosis not present

## 2020-01-30 DIAGNOSIS — R531 Weakness: Secondary | ICD-10-CM | POA: Diagnosis not present

## 2020-01-31 DIAGNOSIS — R531 Weakness: Secondary | ICD-10-CM | POA: Diagnosis not present

## 2020-01-31 DIAGNOSIS — J9611 Chronic respiratory failure with hypoxia: Secondary | ICD-10-CM | POA: Diagnosis not present

## 2020-01-31 DIAGNOSIS — I5033 Acute on chronic diastolic (congestive) heart failure: Secondary | ICD-10-CM | POA: Diagnosis not present

## 2020-01-31 DIAGNOSIS — J449 Chronic obstructive pulmonary disease, unspecified: Secondary | ICD-10-CM | POA: Diagnosis not present

## 2020-01-31 DIAGNOSIS — E039 Hypothyroidism, unspecified: Secondary | ICD-10-CM | POA: Diagnosis not present

## 2020-01-31 DIAGNOSIS — E43 Unspecified severe protein-calorie malnutrition: Secondary | ICD-10-CM | POA: Diagnosis not present

## 2020-02-01 DIAGNOSIS — R531 Weakness: Secondary | ICD-10-CM | POA: Diagnosis not present

## 2020-02-01 DIAGNOSIS — I5032 Chronic diastolic (congestive) heart failure: Secondary | ICD-10-CM | POA: Diagnosis not present

## 2020-02-01 DIAGNOSIS — I4891 Unspecified atrial fibrillation: Secondary | ICD-10-CM | POA: Diagnosis not present

## 2020-02-01 DIAGNOSIS — E43 Unspecified severe protein-calorie malnutrition: Secondary | ICD-10-CM | POA: Diagnosis not present

## 2020-02-01 DIAGNOSIS — Z9981 Dependence on supplemental oxygen: Secondary | ICD-10-CM | POA: Diagnosis not present

## 2020-02-01 DIAGNOSIS — I5033 Acute on chronic diastolic (congestive) heart failure: Secondary | ICD-10-CM | POA: Diagnosis not present

## 2020-02-01 DIAGNOSIS — R0602 Shortness of breath: Secondary | ICD-10-CM | POA: Diagnosis not present

## 2020-02-01 DIAGNOSIS — R0600 Dyspnea, unspecified: Secondary | ICD-10-CM | POA: Diagnosis not present

## 2020-02-01 DIAGNOSIS — J918 Pleural effusion in other conditions classified elsewhere: Secondary | ICD-10-CM | POA: Diagnosis not present

## 2020-02-01 DIAGNOSIS — E039 Hypothyroidism, unspecified: Secondary | ICD-10-CM | POA: Diagnosis not present

## 2020-02-01 DIAGNOSIS — J9611 Chronic respiratory failure with hypoxia: Secondary | ICD-10-CM | POA: Diagnosis not present

## 2020-02-01 DIAGNOSIS — J449 Chronic obstructive pulmonary disease, unspecified: Secondary | ICD-10-CM | POA: Diagnosis not present

## 2020-02-02 DIAGNOSIS — J449 Chronic obstructive pulmonary disease, unspecified: Secondary | ICD-10-CM | POA: Diagnosis not present

## 2020-02-02 DIAGNOSIS — E039 Hypothyroidism, unspecified: Secondary | ICD-10-CM | POA: Diagnosis not present

## 2020-02-02 DIAGNOSIS — J9611 Chronic respiratory failure with hypoxia: Secondary | ICD-10-CM | POA: Diagnosis not present

## 2020-02-02 DIAGNOSIS — I5033 Acute on chronic diastolic (congestive) heart failure: Secondary | ICD-10-CM | POA: Diagnosis not present

## 2020-02-02 DIAGNOSIS — R531 Weakness: Secondary | ICD-10-CM | POA: Diagnosis not present

## 2020-02-02 DIAGNOSIS — E43 Unspecified severe protein-calorie malnutrition: Secondary | ICD-10-CM | POA: Diagnosis not present

## 2020-02-06 DIAGNOSIS — J9611 Chronic respiratory failure with hypoxia: Secondary | ICD-10-CM | POA: Diagnosis not present

## 2020-02-06 DIAGNOSIS — R531 Weakness: Secondary | ICD-10-CM | POA: Diagnosis not present

## 2020-02-06 DIAGNOSIS — E43 Unspecified severe protein-calorie malnutrition: Secondary | ICD-10-CM | POA: Diagnosis not present

## 2020-02-06 DIAGNOSIS — J449 Chronic obstructive pulmonary disease, unspecified: Secondary | ICD-10-CM | POA: Diagnosis not present

## 2020-02-06 DIAGNOSIS — E039 Hypothyroidism, unspecified: Secondary | ICD-10-CM | POA: Diagnosis not present

## 2020-02-06 DIAGNOSIS — I5033 Acute on chronic diastolic (congestive) heart failure: Secondary | ICD-10-CM | POA: Diagnosis not present

## 2020-02-07 DIAGNOSIS — J449 Chronic obstructive pulmonary disease, unspecified: Secondary | ICD-10-CM | POA: Diagnosis not present

## 2020-02-07 DIAGNOSIS — E039 Hypothyroidism, unspecified: Secondary | ICD-10-CM | POA: Diagnosis not present

## 2020-02-07 DIAGNOSIS — R531 Weakness: Secondary | ICD-10-CM | POA: Diagnosis not present

## 2020-02-07 DIAGNOSIS — J9611 Chronic respiratory failure with hypoxia: Secondary | ICD-10-CM | POA: Diagnosis not present

## 2020-02-07 DIAGNOSIS — E43 Unspecified severe protein-calorie malnutrition: Secondary | ICD-10-CM | POA: Diagnosis not present

## 2020-02-07 DIAGNOSIS — I5033 Acute on chronic diastolic (congestive) heart failure: Secondary | ICD-10-CM | POA: Diagnosis not present

## 2020-02-09 DIAGNOSIS — E43 Unspecified severe protein-calorie malnutrition: Secondary | ICD-10-CM | POA: Diagnosis not present

## 2020-02-09 DIAGNOSIS — R531 Weakness: Secondary | ICD-10-CM | POA: Diagnosis not present

## 2020-02-09 DIAGNOSIS — E039 Hypothyroidism, unspecified: Secondary | ICD-10-CM | POA: Diagnosis not present

## 2020-02-09 DIAGNOSIS — I5033 Acute on chronic diastolic (congestive) heart failure: Secondary | ICD-10-CM | POA: Diagnosis not present

## 2020-02-09 DIAGNOSIS — J9611 Chronic respiratory failure with hypoxia: Secondary | ICD-10-CM | POA: Diagnosis not present

## 2020-02-09 DIAGNOSIS — J449 Chronic obstructive pulmonary disease, unspecified: Secondary | ICD-10-CM | POA: Diagnosis not present

## 2020-02-12 DIAGNOSIS — J9611 Chronic respiratory failure with hypoxia: Secondary | ICD-10-CM | POA: Diagnosis not present

## 2020-02-12 DIAGNOSIS — I5033 Acute on chronic diastolic (congestive) heart failure: Secondary | ICD-10-CM | POA: Diagnosis not present

## 2020-02-12 DIAGNOSIS — E039 Hypothyroidism, unspecified: Secondary | ICD-10-CM | POA: Diagnosis not present

## 2020-02-12 DIAGNOSIS — J449 Chronic obstructive pulmonary disease, unspecified: Secondary | ICD-10-CM | POA: Diagnosis not present

## 2020-02-12 DIAGNOSIS — R531 Weakness: Secondary | ICD-10-CM | POA: Diagnosis not present

## 2020-02-12 DIAGNOSIS — E43 Unspecified severe protein-calorie malnutrition: Secondary | ICD-10-CM | POA: Diagnosis not present

## 2020-02-14 DIAGNOSIS — I5033 Acute on chronic diastolic (congestive) heart failure: Secondary | ICD-10-CM | POA: Diagnosis not present

## 2020-02-14 DIAGNOSIS — E039 Hypothyroidism, unspecified: Secondary | ICD-10-CM | POA: Diagnosis not present

## 2020-02-14 DIAGNOSIS — E43 Unspecified severe protein-calorie malnutrition: Secondary | ICD-10-CM | POA: Diagnosis not present

## 2020-02-14 DIAGNOSIS — J9611 Chronic respiratory failure with hypoxia: Secondary | ICD-10-CM | POA: Diagnosis not present

## 2020-02-14 DIAGNOSIS — R531 Weakness: Secondary | ICD-10-CM | POA: Diagnosis not present

## 2020-02-14 DIAGNOSIS — J449 Chronic obstructive pulmonary disease, unspecified: Secondary | ICD-10-CM | POA: Diagnosis not present

## 2020-02-16 DIAGNOSIS — I5033 Acute on chronic diastolic (congestive) heart failure: Secondary | ICD-10-CM | POA: Diagnosis not present

## 2020-02-16 DIAGNOSIS — J449 Chronic obstructive pulmonary disease, unspecified: Secondary | ICD-10-CM | POA: Diagnosis not present

## 2020-02-16 DIAGNOSIS — E039 Hypothyroidism, unspecified: Secondary | ICD-10-CM | POA: Diagnosis not present

## 2020-02-16 DIAGNOSIS — J9611 Chronic respiratory failure with hypoxia: Secondary | ICD-10-CM | POA: Diagnosis not present

## 2020-02-16 DIAGNOSIS — R531 Weakness: Secondary | ICD-10-CM | POA: Diagnosis not present

## 2020-02-16 DIAGNOSIS — E43 Unspecified severe protein-calorie malnutrition: Secondary | ICD-10-CM | POA: Diagnosis not present

## 2020-02-19 DIAGNOSIS — E43 Unspecified severe protein-calorie malnutrition: Secondary | ICD-10-CM | POA: Diagnosis not present

## 2020-02-19 DIAGNOSIS — J449 Chronic obstructive pulmonary disease, unspecified: Secondary | ICD-10-CM | POA: Diagnosis not present

## 2020-02-19 DIAGNOSIS — I5033 Acute on chronic diastolic (congestive) heart failure: Secondary | ICD-10-CM | POA: Diagnosis not present

## 2020-02-19 DIAGNOSIS — R531 Weakness: Secondary | ICD-10-CM | POA: Diagnosis not present

## 2020-02-19 DIAGNOSIS — E039 Hypothyroidism, unspecified: Secondary | ICD-10-CM | POA: Diagnosis not present

## 2020-02-19 DIAGNOSIS — J9611 Chronic respiratory failure with hypoxia: Secondary | ICD-10-CM | POA: Diagnosis not present

## 2020-02-21 DIAGNOSIS — E43 Unspecified severe protein-calorie malnutrition: Secondary | ICD-10-CM | POA: Diagnosis not present

## 2020-02-21 DIAGNOSIS — I5033 Acute on chronic diastolic (congestive) heart failure: Secondary | ICD-10-CM | POA: Diagnosis not present

## 2020-02-21 DIAGNOSIS — J449 Chronic obstructive pulmonary disease, unspecified: Secondary | ICD-10-CM | POA: Diagnosis not present

## 2020-02-21 DIAGNOSIS — R531 Weakness: Secondary | ICD-10-CM | POA: Diagnosis not present

## 2020-02-21 DIAGNOSIS — E039 Hypothyroidism, unspecified: Secondary | ICD-10-CM | POA: Diagnosis not present

## 2020-02-21 DIAGNOSIS — J9611 Chronic respiratory failure with hypoxia: Secondary | ICD-10-CM | POA: Diagnosis not present

## 2020-02-22 DIAGNOSIS — R531 Weakness: Secondary | ICD-10-CM | POA: Diagnosis not present

## 2020-02-22 DIAGNOSIS — E039 Hypothyroidism, unspecified: Secondary | ICD-10-CM | POA: Diagnosis not present

## 2020-02-22 DIAGNOSIS — J9611 Chronic respiratory failure with hypoxia: Secondary | ICD-10-CM | POA: Diagnosis not present

## 2020-02-22 DIAGNOSIS — E43 Unspecified severe protein-calorie malnutrition: Secondary | ICD-10-CM | POA: Diagnosis not present

## 2020-02-22 DIAGNOSIS — I5033 Acute on chronic diastolic (congestive) heart failure: Secondary | ICD-10-CM | POA: Diagnosis not present

## 2020-02-22 DIAGNOSIS — J449 Chronic obstructive pulmonary disease, unspecified: Secondary | ICD-10-CM | POA: Diagnosis not present

## 2020-02-23 DIAGNOSIS — J449 Chronic obstructive pulmonary disease, unspecified: Secondary | ICD-10-CM | POA: Diagnosis not present

## 2020-02-23 DIAGNOSIS — R531 Weakness: Secondary | ICD-10-CM | POA: Diagnosis not present

## 2020-02-23 DIAGNOSIS — J9611 Chronic respiratory failure with hypoxia: Secondary | ICD-10-CM | POA: Diagnosis not present

## 2020-02-23 DIAGNOSIS — I5033 Acute on chronic diastolic (congestive) heart failure: Secondary | ICD-10-CM | POA: Diagnosis not present

## 2020-02-23 DIAGNOSIS — E43 Unspecified severe protein-calorie malnutrition: Secondary | ICD-10-CM | POA: Diagnosis not present

## 2020-02-23 DIAGNOSIS — E039 Hypothyroidism, unspecified: Secondary | ICD-10-CM | POA: Diagnosis not present

## 2020-02-26 DIAGNOSIS — J449 Chronic obstructive pulmonary disease, unspecified: Secondary | ICD-10-CM | POA: Diagnosis not present

## 2020-02-26 DIAGNOSIS — I5033 Acute on chronic diastolic (congestive) heart failure: Secondary | ICD-10-CM | POA: Diagnosis not present

## 2020-02-26 DIAGNOSIS — E43 Unspecified severe protein-calorie malnutrition: Secondary | ICD-10-CM | POA: Diagnosis not present

## 2020-02-26 DIAGNOSIS — E039 Hypothyroidism, unspecified: Secondary | ICD-10-CM | POA: Diagnosis not present

## 2020-02-26 DIAGNOSIS — R531 Weakness: Secondary | ICD-10-CM | POA: Diagnosis not present

## 2020-02-26 DIAGNOSIS — J9611 Chronic respiratory failure with hypoxia: Secondary | ICD-10-CM | POA: Diagnosis not present

## 2020-02-28 DIAGNOSIS — J449 Chronic obstructive pulmonary disease, unspecified: Secondary | ICD-10-CM | POA: Diagnosis not present

## 2020-02-28 DIAGNOSIS — E43 Unspecified severe protein-calorie malnutrition: Secondary | ICD-10-CM | POA: Diagnosis not present

## 2020-02-28 DIAGNOSIS — I5033 Acute on chronic diastolic (congestive) heart failure: Secondary | ICD-10-CM | POA: Diagnosis not present

## 2020-02-28 DIAGNOSIS — E039 Hypothyroidism, unspecified: Secondary | ICD-10-CM | POA: Diagnosis not present

## 2020-02-28 DIAGNOSIS — J9611 Chronic respiratory failure with hypoxia: Secondary | ICD-10-CM | POA: Diagnosis not present

## 2020-02-28 DIAGNOSIS — R531 Weakness: Secondary | ICD-10-CM | POA: Diagnosis not present

## 2020-03-01 DIAGNOSIS — I5033 Acute on chronic diastolic (congestive) heart failure: Secondary | ICD-10-CM | POA: Diagnosis not present

## 2020-03-01 DIAGNOSIS — R531 Weakness: Secondary | ICD-10-CM | POA: Diagnosis not present

## 2020-03-01 DIAGNOSIS — J449 Chronic obstructive pulmonary disease, unspecified: Secondary | ICD-10-CM | POA: Diagnosis not present

## 2020-03-01 DIAGNOSIS — J9611 Chronic respiratory failure with hypoxia: Secondary | ICD-10-CM | POA: Diagnosis not present

## 2020-03-01 DIAGNOSIS — E43 Unspecified severe protein-calorie malnutrition: Secondary | ICD-10-CM | POA: Diagnosis not present

## 2020-03-01 DIAGNOSIS — E039 Hypothyroidism, unspecified: Secondary | ICD-10-CM | POA: Diagnosis not present

## 2020-03-03 DIAGNOSIS — R531 Weakness: Secondary | ICD-10-CM | POA: Diagnosis not present

## 2020-03-03 DIAGNOSIS — Z9981 Dependence on supplemental oxygen: Secondary | ICD-10-CM | POA: Diagnosis not present

## 2020-03-03 DIAGNOSIS — J449 Chronic obstructive pulmonary disease, unspecified: Secondary | ICD-10-CM | POA: Diagnosis not present

## 2020-03-03 DIAGNOSIS — J918 Pleural effusion in other conditions classified elsewhere: Secondary | ICD-10-CM | POA: Diagnosis not present

## 2020-03-03 DIAGNOSIS — R0600 Dyspnea, unspecified: Secondary | ICD-10-CM | POA: Diagnosis not present

## 2020-03-03 DIAGNOSIS — J9611 Chronic respiratory failure with hypoxia: Secondary | ICD-10-CM | POA: Diagnosis not present

## 2020-03-03 DIAGNOSIS — R0602 Shortness of breath: Secondary | ICD-10-CM | POA: Diagnosis not present

## 2020-03-03 DIAGNOSIS — E039 Hypothyroidism, unspecified: Secondary | ICD-10-CM | POA: Diagnosis not present

## 2020-03-03 DIAGNOSIS — E43 Unspecified severe protein-calorie malnutrition: Secondary | ICD-10-CM | POA: Diagnosis not present

## 2020-03-03 DIAGNOSIS — I4891 Unspecified atrial fibrillation: Secondary | ICD-10-CM | POA: Diagnosis not present

## 2020-03-03 DIAGNOSIS — I5033 Acute on chronic diastolic (congestive) heart failure: Secondary | ICD-10-CM | POA: Diagnosis not present

## 2020-03-03 DIAGNOSIS — I5032 Chronic diastolic (congestive) heart failure: Secondary | ICD-10-CM | POA: Diagnosis not present

## 2020-03-04 DIAGNOSIS — J449 Chronic obstructive pulmonary disease, unspecified: Secondary | ICD-10-CM | POA: Diagnosis not present

## 2020-03-04 DIAGNOSIS — I5033 Acute on chronic diastolic (congestive) heart failure: Secondary | ICD-10-CM | POA: Diagnosis not present

## 2020-03-04 DIAGNOSIS — E039 Hypothyroidism, unspecified: Secondary | ICD-10-CM | POA: Diagnosis not present

## 2020-03-04 DIAGNOSIS — R531 Weakness: Secondary | ICD-10-CM | POA: Diagnosis not present

## 2020-03-04 DIAGNOSIS — J9611 Chronic respiratory failure with hypoxia: Secondary | ICD-10-CM | POA: Diagnosis not present

## 2020-03-04 DIAGNOSIS — E43 Unspecified severe protein-calorie malnutrition: Secondary | ICD-10-CM | POA: Diagnosis not present

## 2020-03-06 DIAGNOSIS — J9611 Chronic respiratory failure with hypoxia: Secondary | ICD-10-CM | POA: Diagnosis not present

## 2020-03-06 DIAGNOSIS — I5033 Acute on chronic diastolic (congestive) heart failure: Secondary | ICD-10-CM | POA: Diagnosis not present

## 2020-03-06 DIAGNOSIS — J449 Chronic obstructive pulmonary disease, unspecified: Secondary | ICD-10-CM | POA: Diagnosis not present

## 2020-03-06 DIAGNOSIS — R531 Weakness: Secondary | ICD-10-CM | POA: Diagnosis not present

## 2020-03-06 DIAGNOSIS — E039 Hypothyroidism, unspecified: Secondary | ICD-10-CM | POA: Diagnosis not present

## 2020-03-06 DIAGNOSIS — E43 Unspecified severe protein-calorie malnutrition: Secondary | ICD-10-CM | POA: Diagnosis not present

## 2020-03-07 DIAGNOSIS — L570 Actinic keratosis: Secondary | ICD-10-CM | POA: Diagnosis not present

## 2020-03-07 DIAGNOSIS — L821 Other seborrheic keratosis: Secondary | ICD-10-CM | POA: Diagnosis not present

## 2020-03-07 DIAGNOSIS — D225 Melanocytic nevi of trunk: Secondary | ICD-10-CM | POA: Diagnosis not present

## 2020-03-08 DIAGNOSIS — J449 Chronic obstructive pulmonary disease, unspecified: Secondary | ICD-10-CM | POA: Diagnosis not present

## 2020-03-08 DIAGNOSIS — I5033 Acute on chronic diastolic (congestive) heart failure: Secondary | ICD-10-CM | POA: Diagnosis not present

## 2020-03-08 DIAGNOSIS — E43 Unspecified severe protein-calorie malnutrition: Secondary | ICD-10-CM | POA: Diagnosis not present

## 2020-03-08 DIAGNOSIS — R531 Weakness: Secondary | ICD-10-CM | POA: Diagnosis not present

## 2020-03-08 DIAGNOSIS — E039 Hypothyroidism, unspecified: Secondary | ICD-10-CM | POA: Diagnosis not present

## 2020-03-08 DIAGNOSIS — J9611 Chronic respiratory failure with hypoxia: Secondary | ICD-10-CM | POA: Diagnosis not present

## 2020-03-11 DIAGNOSIS — I5033 Acute on chronic diastolic (congestive) heart failure: Secondary | ICD-10-CM | POA: Diagnosis not present

## 2020-03-11 DIAGNOSIS — J9611 Chronic respiratory failure with hypoxia: Secondary | ICD-10-CM | POA: Diagnosis not present

## 2020-03-11 DIAGNOSIS — R531 Weakness: Secondary | ICD-10-CM | POA: Diagnosis not present

## 2020-03-11 DIAGNOSIS — E039 Hypothyroidism, unspecified: Secondary | ICD-10-CM | POA: Diagnosis not present

## 2020-03-11 DIAGNOSIS — J449 Chronic obstructive pulmonary disease, unspecified: Secondary | ICD-10-CM | POA: Diagnosis not present

## 2020-03-11 DIAGNOSIS — E43 Unspecified severe protein-calorie malnutrition: Secondary | ICD-10-CM | POA: Diagnosis not present

## 2020-03-12 DIAGNOSIS — J449 Chronic obstructive pulmonary disease, unspecified: Secondary | ICD-10-CM | POA: Diagnosis not present

## 2020-03-12 DIAGNOSIS — E43 Unspecified severe protein-calorie malnutrition: Secondary | ICD-10-CM | POA: Diagnosis not present

## 2020-03-12 DIAGNOSIS — R531 Weakness: Secondary | ICD-10-CM | POA: Diagnosis not present

## 2020-03-12 DIAGNOSIS — J9611 Chronic respiratory failure with hypoxia: Secondary | ICD-10-CM | POA: Diagnosis not present

## 2020-03-12 DIAGNOSIS — E039 Hypothyroidism, unspecified: Secondary | ICD-10-CM | POA: Diagnosis not present

## 2020-03-12 DIAGNOSIS — I5033 Acute on chronic diastolic (congestive) heart failure: Secondary | ICD-10-CM | POA: Diagnosis not present

## 2020-03-13 DIAGNOSIS — R531 Weakness: Secondary | ICD-10-CM | POA: Diagnosis not present

## 2020-03-13 DIAGNOSIS — E43 Unspecified severe protein-calorie malnutrition: Secondary | ICD-10-CM | POA: Diagnosis not present

## 2020-03-13 DIAGNOSIS — E039 Hypothyroidism, unspecified: Secondary | ICD-10-CM | POA: Diagnosis not present

## 2020-03-13 DIAGNOSIS — J9611 Chronic respiratory failure with hypoxia: Secondary | ICD-10-CM | POA: Diagnosis not present

## 2020-03-13 DIAGNOSIS — I5033 Acute on chronic diastolic (congestive) heart failure: Secondary | ICD-10-CM | POA: Diagnosis not present

## 2020-03-13 DIAGNOSIS — J449 Chronic obstructive pulmonary disease, unspecified: Secondary | ICD-10-CM | POA: Diagnosis not present

## 2020-03-14 DIAGNOSIS — R531 Weakness: Secondary | ICD-10-CM | POA: Diagnosis not present

## 2020-03-14 DIAGNOSIS — E43 Unspecified severe protein-calorie malnutrition: Secondary | ICD-10-CM | POA: Diagnosis not present

## 2020-03-14 DIAGNOSIS — J449 Chronic obstructive pulmonary disease, unspecified: Secondary | ICD-10-CM | POA: Diagnosis not present

## 2020-03-14 DIAGNOSIS — J9611 Chronic respiratory failure with hypoxia: Secondary | ICD-10-CM | POA: Diagnosis not present

## 2020-03-14 DIAGNOSIS — E039 Hypothyroidism, unspecified: Secondary | ICD-10-CM | POA: Diagnosis not present

## 2020-03-14 DIAGNOSIS — I5033 Acute on chronic diastolic (congestive) heart failure: Secondary | ICD-10-CM | POA: Diagnosis not present

## 2020-03-15 DIAGNOSIS — E039 Hypothyroidism, unspecified: Secondary | ICD-10-CM | POA: Diagnosis not present

## 2020-03-15 DIAGNOSIS — J9611 Chronic respiratory failure with hypoxia: Secondary | ICD-10-CM | POA: Diagnosis not present

## 2020-03-15 DIAGNOSIS — I5033 Acute on chronic diastolic (congestive) heart failure: Secondary | ICD-10-CM | POA: Diagnosis not present

## 2020-03-15 DIAGNOSIS — E43 Unspecified severe protein-calorie malnutrition: Secondary | ICD-10-CM | POA: Diagnosis not present

## 2020-03-15 DIAGNOSIS — R531 Weakness: Secondary | ICD-10-CM | POA: Diagnosis not present

## 2020-03-15 DIAGNOSIS — J449 Chronic obstructive pulmonary disease, unspecified: Secondary | ICD-10-CM | POA: Diagnosis not present

## 2020-03-18 DIAGNOSIS — J9611 Chronic respiratory failure with hypoxia: Secondary | ICD-10-CM | POA: Diagnosis not present

## 2020-03-18 DIAGNOSIS — J449 Chronic obstructive pulmonary disease, unspecified: Secondary | ICD-10-CM | POA: Diagnosis not present

## 2020-03-18 DIAGNOSIS — R531 Weakness: Secondary | ICD-10-CM | POA: Diagnosis not present

## 2020-03-18 DIAGNOSIS — E039 Hypothyroidism, unspecified: Secondary | ICD-10-CM | POA: Diagnosis not present

## 2020-03-18 DIAGNOSIS — I5033 Acute on chronic diastolic (congestive) heart failure: Secondary | ICD-10-CM | POA: Diagnosis not present

## 2020-03-18 DIAGNOSIS — E43 Unspecified severe protein-calorie malnutrition: Secondary | ICD-10-CM | POA: Diagnosis not present

## 2020-03-19 DIAGNOSIS — J9611 Chronic respiratory failure with hypoxia: Secondary | ICD-10-CM | POA: Diagnosis not present

## 2020-03-19 DIAGNOSIS — R531 Weakness: Secondary | ICD-10-CM | POA: Diagnosis not present

## 2020-03-19 DIAGNOSIS — E039 Hypothyroidism, unspecified: Secondary | ICD-10-CM | POA: Diagnosis not present

## 2020-03-19 DIAGNOSIS — E43 Unspecified severe protein-calorie malnutrition: Secondary | ICD-10-CM | POA: Diagnosis not present

## 2020-03-19 DIAGNOSIS — J449 Chronic obstructive pulmonary disease, unspecified: Secondary | ICD-10-CM | POA: Diagnosis not present

## 2020-03-19 DIAGNOSIS — I5033 Acute on chronic diastolic (congestive) heart failure: Secondary | ICD-10-CM | POA: Diagnosis not present

## 2020-03-20 DIAGNOSIS — E43 Unspecified severe protein-calorie malnutrition: Secondary | ICD-10-CM | POA: Diagnosis not present

## 2020-03-20 DIAGNOSIS — E039 Hypothyroidism, unspecified: Secondary | ICD-10-CM | POA: Diagnosis not present

## 2020-03-20 DIAGNOSIS — R531 Weakness: Secondary | ICD-10-CM | POA: Diagnosis not present

## 2020-03-20 DIAGNOSIS — J449 Chronic obstructive pulmonary disease, unspecified: Secondary | ICD-10-CM | POA: Diagnosis not present

## 2020-03-20 DIAGNOSIS — I5033 Acute on chronic diastolic (congestive) heart failure: Secondary | ICD-10-CM | POA: Diagnosis not present

## 2020-03-20 DIAGNOSIS — J9611 Chronic respiratory failure with hypoxia: Secondary | ICD-10-CM | POA: Diagnosis not present

## 2020-03-22 DIAGNOSIS — J9611 Chronic respiratory failure with hypoxia: Secondary | ICD-10-CM | POA: Diagnosis not present

## 2020-03-22 DIAGNOSIS — I5033 Acute on chronic diastolic (congestive) heart failure: Secondary | ICD-10-CM | POA: Diagnosis not present

## 2020-03-22 DIAGNOSIS — E43 Unspecified severe protein-calorie malnutrition: Secondary | ICD-10-CM | POA: Diagnosis not present

## 2020-03-22 DIAGNOSIS — E039 Hypothyroidism, unspecified: Secondary | ICD-10-CM | POA: Diagnosis not present

## 2020-03-22 DIAGNOSIS — J449 Chronic obstructive pulmonary disease, unspecified: Secondary | ICD-10-CM | POA: Diagnosis not present

## 2020-03-22 DIAGNOSIS — R531 Weakness: Secondary | ICD-10-CM | POA: Diagnosis not present

## 2020-03-25 DIAGNOSIS — J9611 Chronic respiratory failure with hypoxia: Secondary | ICD-10-CM | POA: Diagnosis not present

## 2020-03-25 DIAGNOSIS — E039 Hypothyroidism, unspecified: Secondary | ICD-10-CM | POA: Diagnosis not present

## 2020-03-25 DIAGNOSIS — R531 Weakness: Secondary | ICD-10-CM | POA: Diagnosis not present

## 2020-03-25 DIAGNOSIS — J449 Chronic obstructive pulmonary disease, unspecified: Secondary | ICD-10-CM | POA: Diagnosis not present

## 2020-03-25 DIAGNOSIS — I5033 Acute on chronic diastolic (congestive) heart failure: Secondary | ICD-10-CM | POA: Diagnosis not present

## 2020-03-25 DIAGNOSIS — E43 Unspecified severe protein-calorie malnutrition: Secondary | ICD-10-CM | POA: Diagnosis not present

## 2020-03-26 DIAGNOSIS — E43 Unspecified severe protein-calorie malnutrition: Secondary | ICD-10-CM | POA: Diagnosis not present

## 2020-03-26 DIAGNOSIS — I5033 Acute on chronic diastolic (congestive) heart failure: Secondary | ICD-10-CM | POA: Diagnosis not present

## 2020-03-26 DIAGNOSIS — E039 Hypothyroidism, unspecified: Secondary | ICD-10-CM | POA: Diagnosis not present

## 2020-03-26 DIAGNOSIS — J449 Chronic obstructive pulmonary disease, unspecified: Secondary | ICD-10-CM | POA: Diagnosis not present

## 2020-03-26 DIAGNOSIS — J9611 Chronic respiratory failure with hypoxia: Secondary | ICD-10-CM | POA: Diagnosis not present

## 2020-03-26 DIAGNOSIS — R531 Weakness: Secondary | ICD-10-CM | POA: Diagnosis not present

## 2020-03-27 DIAGNOSIS — E43 Unspecified severe protein-calorie malnutrition: Secondary | ICD-10-CM | POA: Diagnosis not present

## 2020-03-27 DIAGNOSIS — I5033 Acute on chronic diastolic (congestive) heart failure: Secondary | ICD-10-CM | POA: Diagnosis not present

## 2020-03-27 DIAGNOSIS — E039 Hypothyroidism, unspecified: Secondary | ICD-10-CM | POA: Diagnosis not present

## 2020-03-27 DIAGNOSIS — J9611 Chronic respiratory failure with hypoxia: Secondary | ICD-10-CM | POA: Diagnosis not present

## 2020-03-27 DIAGNOSIS — R531 Weakness: Secondary | ICD-10-CM | POA: Diagnosis not present

## 2020-03-27 DIAGNOSIS — J449 Chronic obstructive pulmonary disease, unspecified: Secondary | ICD-10-CM | POA: Diagnosis not present

## 2020-03-29 DIAGNOSIS — E039 Hypothyroidism, unspecified: Secondary | ICD-10-CM | POA: Diagnosis not present

## 2020-03-29 DIAGNOSIS — I5033 Acute on chronic diastolic (congestive) heart failure: Secondary | ICD-10-CM | POA: Diagnosis not present

## 2020-03-29 DIAGNOSIS — J9611 Chronic respiratory failure with hypoxia: Secondary | ICD-10-CM | POA: Diagnosis not present

## 2020-03-29 DIAGNOSIS — J449 Chronic obstructive pulmonary disease, unspecified: Secondary | ICD-10-CM | POA: Diagnosis not present

## 2020-03-29 DIAGNOSIS — R531 Weakness: Secondary | ICD-10-CM | POA: Diagnosis not present

## 2020-03-29 DIAGNOSIS — E43 Unspecified severe protein-calorie malnutrition: Secondary | ICD-10-CM | POA: Diagnosis not present

## 2020-04-01 DIAGNOSIS — J9611 Chronic respiratory failure with hypoxia: Secondary | ICD-10-CM | POA: Diagnosis not present

## 2020-04-01 DIAGNOSIS — I5033 Acute on chronic diastolic (congestive) heart failure: Secondary | ICD-10-CM | POA: Diagnosis not present

## 2020-04-01 DIAGNOSIS — E43 Unspecified severe protein-calorie malnutrition: Secondary | ICD-10-CM | POA: Diagnosis not present

## 2020-04-01 DIAGNOSIS — J449 Chronic obstructive pulmonary disease, unspecified: Secondary | ICD-10-CM | POA: Diagnosis not present

## 2020-04-01 DIAGNOSIS — E039 Hypothyroidism, unspecified: Secondary | ICD-10-CM | POA: Diagnosis not present

## 2020-04-01 DIAGNOSIS — R531 Weakness: Secondary | ICD-10-CM | POA: Diagnosis not present

## 2020-04-03 DIAGNOSIS — J9611 Chronic respiratory failure with hypoxia: Secondary | ICD-10-CM | POA: Diagnosis not present

## 2020-04-03 DIAGNOSIS — E43 Unspecified severe protein-calorie malnutrition: Secondary | ICD-10-CM | POA: Diagnosis not present

## 2020-04-03 DIAGNOSIS — E039 Hypothyroidism, unspecified: Secondary | ICD-10-CM | POA: Diagnosis not present

## 2020-04-03 DIAGNOSIS — R0600 Dyspnea, unspecified: Secondary | ICD-10-CM | POA: Diagnosis not present

## 2020-04-03 DIAGNOSIS — I5032 Chronic diastolic (congestive) heart failure: Secondary | ICD-10-CM | POA: Diagnosis not present

## 2020-04-03 DIAGNOSIS — Z9981 Dependence on supplemental oxygen: Secondary | ICD-10-CM | POA: Diagnosis not present

## 2020-04-03 DIAGNOSIS — J449 Chronic obstructive pulmonary disease, unspecified: Secondary | ICD-10-CM | POA: Diagnosis not present

## 2020-04-03 DIAGNOSIS — I4891 Unspecified atrial fibrillation: Secondary | ICD-10-CM | POA: Diagnosis not present

## 2020-04-03 DIAGNOSIS — I5033 Acute on chronic diastolic (congestive) heart failure: Secondary | ICD-10-CM | POA: Diagnosis not present

## 2020-04-03 DIAGNOSIS — J918 Pleural effusion in other conditions classified elsewhere: Secondary | ICD-10-CM | POA: Diagnosis not present

## 2020-04-03 DIAGNOSIS — R531 Weakness: Secondary | ICD-10-CM | POA: Diagnosis not present

## 2020-04-03 DIAGNOSIS — R0602 Shortness of breath: Secondary | ICD-10-CM | POA: Diagnosis not present

## 2020-04-05 DIAGNOSIS — J449 Chronic obstructive pulmonary disease, unspecified: Secondary | ICD-10-CM | POA: Diagnosis not present

## 2020-04-05 DIAGNOSIS — I5033 Acute on chronic diastolic (congestive) heart failure: Secondary | ICD-10-CM | POA: Diagnosis not present

## 2020-04-05 DIAGNOSIS — R531 Weakness: Secondary | ICD-10-CM | POA: Diagnosis not present

## 2020-04-05 DIAGNOSIS — J9611 Chronic respiratory failure with hypoxia: Secondary | ICD-10-CM | POA: Diagnosis not present

## 2020-04-05 DIAGNOSIS — E43 Unspecified severe protein-calorie malnutrition: Secondary | ICD-10-CM | POA: Diagnosis not present

## 2020-04-05 DIAGNOSIS — E039 Hypothyroidism, unspecified: Secondary | ICD-10-CM | POA: Diagnosis not present

## 2020-04-10 DIAGNOSIS — E43 Unspecified severe protein-calorie malnutrition: Secondary | ICD-10-CM | POA: Diagnosis not present

## 2020-04-10 DIAGNOSIS — R531 Weakness: Secondary | ICD-10-CM | POA: Diagnosis not present

## 2020-04-10 DIAGNOSIS — I5033 Acute on chronic diastolic (congestive) heart failure: Secondary | ICD-10-CM | POA: Diagnosis not present

## 2020-04-10 DIAGNOSIS — E039 Hypothyroidism, unspecified: Secondary | ICD-10-CM | POA: Diagnosis not present

## 2020-04-10 DIAGNOSIS — J449 Chronic obstructive pulmonary disease, unspecified: Secondary | ICD-10-CM | POA: Diagnosis not present

## 2020-04-10 DIAGNOSIS — J9611 Chronic respiratory failure with hypoxia: Secondary | ICD-10-CM | POA: Diagnosis not present

## 2020-04-11 DIAGNOSIS — E43 Unspecified severe protein-calorie malnutrition: Secondary | ICD-10-CM | POA: Diagnosis not present

## 2020-04-11 DIAGNOSIS — J9611 Chronic respiratory failure with hypoxia: Secondary | ICD-10-CM | POA: Diagnosis not present

## 2020-04-11 DIAGNOSIS — I5033 Acute on chronic diastolic (congestive) heart failure: Secondary | ICD-10-CM | POA: Diagnosis not present

## 2020-04-11 DIAGNOSIS — E039 Hypothyroidism, unspecified: Secondary | ICD-10-CM | POA: Diagnosis not present

## 2020-04-11 DIAGNOSIS — R531 Weakness: Secondary | ICD-10-CM | POA: Diagnosis not present

## 2020-04-11 DIAGNOSIS — J449 Chronic obstructive pulmonary disease, unspecified: Secondary | ICD-10-CM | POA: Diagnosis not present

## 2020-04-12 DIAGNOSIS — I5033 Acute on chronic diastolic (congestive) heart failure: Secondary | ICD-10-CM | POA: Diagnosis not present

## 2020-04-12 DIAGNOSIS — E43 Unspecified severe protein-calorie malnutrition: Secondary | ICD-10-CM | POA: Diagnosis not present

## 2020-04-12 DIAGNOSIS — J449 Chronic obstructive pulmonary disease, unspecified: Secondary | ICD-10-CM | POA: Diagnosis not present

## 2020-04-12 DIAGNOSIS — R531 Weakness: Secondary | ICD-10-CM | POA: Diagnosis not present

## 2020-04-12 DIAGNOSIS — E039 Hypothyroidism, unspecified: Secondary | ICD-10-CM | POA: Diagnosis not present

## 2020-04-12 DIAGNOSIS — J9611 Chronic respiratory failure with hypoxia: Secondary | ICD-10-CM | POA: Diagnosis not present

## 2020-04-15 DIAGNOSIS — R531 Weakness: Secondary | ICD-10-CM | POA: Diagnosis not present

## 2020-04-15 DIAGNOSIS — E43 Unspecified severe protein-calorie malnutrition: Secondary | ICD-10-CM | POA: Diagnosis not present

## 2020-04-15 DIAGNOSIS — J9611 Chronic respiratory failure with hypoxia: Secondary | ICD-10-CM | POA: Diagnosis not present

## 2020-04-15 DIAGNOSIS — E039 Hypothyroidism, unspecified: Secondary | ICD-10-CM | POA: Diagnosis not present

## 2020-04-15 DIAGNOSIS — J449 Chronic obstructive pulmonary disease, unspecified: Secondary | ICD-10-CM | POA: Diagnosis not present

## 2020-04-15 DIAGNOSIS — I5033 Acute on chronic diastolic (congestive) heart failure: Secondary | ICD-10-CM | POA: Diagnosis not present

## 2020-04-17 DIAGNOSIS — I5033 Acute on chronic diastolic (congestive) heart failure: Secondary | ICD-10-CM | POA: Diagnosis not present

## 2020-04-17 DIAGNOSIS — E43 Unspecified severe protein-calorie malnutrition: Secondary | ICD-10-CM | POA: Diagnosis not present

## 2020-04-17 DIAGNOSIS — R531 Weakness: Secondary | ICD-10-CM | POA: Diagnosis not present

## 2020-04-17 DIAGNOSIS — J449 Chronic obstructive pulmonary disease, unspecified: Secondary | ICD-10-CM | POA: Diagnosis not present

## 2020-04-17 DIAGNOSIS — E039 Hypothyroidism, unspecified: Secondary | ICD-10-CM | POA: Diagnosis not present

## 2020-04-17 DIAGNOSIS — J9611 Chronic respiratory failure with hypoxia: Secondary | ICD-10-CM | POA: Diagnosis not present

## 2020-04-19 DIAGNOSIS — I480 Paroxysmal atrial fibrillation: Secondary | ICD-10-CM | POA: Diagnosis not present

## 2020-04-19 DIAGNOSIS — E538 Deficiency of other specified B group vitamins: Secondary | ICD-10-CM | POA: Diagnosis not present

## 2020-04-19 DIAGNOSIS — I5033 Acute on chronic diastolic (congestive) heart failure: Secondary | ICD-10-CM | POA: Diagnosis not present

## 2020-04-19 DIAGNOSIS — M797 Fibromyalgia: Secondary | ICD-10-CM | POA: Diagnosis not present

## 2020-04-19 DIAGNOSIS — R531 Weakness: Secondary | ICD-10-CM | POA: Diagnosis not present

## 2020-04-19 DIAGNOSIS — I2510 Atherosclerotic heart disease of native coronary artery without angina pectoris: Secondary | ICD-10-CM | POA: Diagnosis not present

## 2020-04-19 DIAGNOSIS — E039 Hypothyroidism, unspecified: Secondary | ICD-10-CM | POA: Diagnosis not present

## 2020-04-19 DIAGNOSIS — J9611 Chronic respiratory failure with hypoxia: Secondary | ICD-10-CM | POA: Diagnosis not present

## 2020-04-19 DIAGNOSIS — I5031 Acute diastolic (congestive) heart failure: Secondary | ICD-10-CM | POA: Diagnosis not present

## 2020-04-19 DIAGNOSIS — D500 Iron deficiency anemia secondary to blood loss (chronic): Secondary | ICD-10-CM | POA: Diagnosis not present

## 2020-04-19 DIAGNOSIS — E785 Hyperlipidemia, unspecified: Secondary | ICD-10-CM | POA: Diagnosis not present

## 2020-04-19 DIAGNOSIS — E43 Unspecified severe protein-calorie malnutrition: Secondary | ICD-10-CM | POA: Diagnosis not present

## 2020-04-19 DIAGNOSIS — I700 Atherosclerosis of aorta: Secondary | ICD-10-CM | POA: Diagnosis not present

## 2020-04-19 DIAGNOSIS — N3281 Overactive bladder: Secondary | ICD-10-CM | POA: Diagnosis not present

## 2020-04-19 DIAGNOSIS — M810 Age-related osteoporosis without current pathological fracture: Secondary | ICD-10-CM | POA: Diagnosis not present

## 2020-04-19 DIAGNOSIS — J449 Chronic obstructive pulmonary disease, unspecified: Secondary | ICD-10-CM | POA: Diagnosis not present

## 2020-04-22 DIAGNOSIS — J9611 Chronic respiratory failure with hypoxia: Secondary | ICD-10-CM | POA: Diagnosis not present

## 2020-04-22 DIAGNOSIS — I5033 Acute on chronic diastolic (congestive) heart failure: Secondary | ICD-10-CM | POA: Diagnosis not present

## 2020-04-22 DIAGNOSIS — E43 Unspecified severe protein-calorie malnutrition: Secondary | ICD-10-CM | POA: Diagnosis not present

## 2020-04-22 DIAGNOSIS — J449 Chronic obstructive pulmonary disease, unspecified: Secondary | ICD-10-CM | POA: Diagnosis not present

## 2020-04-22 DIAGNOSIS — E039 Hypothyroidism, unspecified: Secondary | ICD-10-CM | POA: Diagnosis not present

## 2020-04-22 DIAGNOSIS — R531 Weakness: Secondary | ICD-10-CM | POA: Diagnosis not present

## 2020-04-24 DIAGNOSIS — J9611 Chronic respiratory failure with hypoxia: Secondary | ICD-10-CM | POA: Diagnosis not present

## 2020-04-24 DIAGNOSIS — E43 Unspecified severe protein-calorie malnutrition: Secondary | ICD-10-CM | POA: Diagnosis not present

## 2020-04-24 DIAGNOSIS — E039 Hypothyroidism, unspecified: Secondary | ICD-10-CM | POA: Diagnosis not present

## 2020-04-24 DIAGNOSIS — J449 Chronic obstructive pulmonary disease, unspecified: Secondary | ICD-10-CM | POA: Diagnosis not present

## 2020-04-24 DIAGNOSIS — R531 Weakness: Secondary | ICD-10-CM | POA: Diagnosis not present

## 2020-04-24 DIAGNOSIS — I5033 Acute on chronic diastolic (congestive) heart failure: Secondary | ICD-10-CM | POA: Diagnosis not present

## 2020-04-26 DIAGNOSIS — E039 Hypothyroidism, unspecified: Secondary | ICD-10-CM | POA: Diagnosis not present

## 2020-04-26 DIAGNOSIS — J9611 Chronic respiratory failure with hypoxia: Secondary | ICD-10-CM | POA: Diagnosis not present

## 2020-04-26 DIAGNOSIS — I5033 Acute on chronic diastolic (congestive) heart failure: Secondary | ICD-10-CM | POA: Diagnosis not present

## 2020-04-26 DIAGNOSIS — E43 Unspecified severe protein-calorie malnutrition: Secondary | ICD-10-CM | POA: Diagnosis not present

## 2020-04-26 DIAGNOSIS — J449 Chronic obstructive pulmonary disease, unspecified: Secondary | ICD-10-CM | POA: Diagnosis not present

## 2020-04-26 DIAGNOSIS — R531 Weakness: Secondary | ICD-10-CM | POA: Diagnosis not present

## 2020-04-29 DIAGNOSIS — R531 Weakness: Secondary | ICD-10-CM | POA: Diagnosis not present

## 2020-04-29 DIAGNOSIS — E039 Hypothyroidism, unspecified: Secondary | ICD-10-CM | POA: Diagnosis not present

## 2020-04-29 DIAGNOSIS — E43 Unspecified severe protein-calorie malnutrition: Secondary | ICD-10-CM | POA: Diagnosis not present

## 2020-04-29 DIAGNOSIS — J9611 Chronic respiratory failure with hypoxia: Secondary | ICD-10-CM | POA: Diagnosis not present

## 2020-04-29 DIAGNOSIS — I5033 Acute on chronic diastolic (congestive) heart failure: Secondary | ICD-10-CM | POA: Diagnosis not present

## 2020-04-29 DIAGNOSIS — J449 Chronic obstructive pulmonary disease, unspecified: Secondary | ICD-10-CM | POA: Diagnosis not present

## 2020-05-01 DIAGNOSIS — I5033 Acute on chronic diastolic (congestive) heart failure: Secondary | ICD-10-CM | POA: Diagnosis not present

## 2020-05-01 DIAGNOSIS — J449 Chronic obstructive pulmonary disease, unspecified: Secondary | ICD-10-CM | POA: Diagnosis not present

## 2020-05-01 DIAGNOSIS — R531 Weakness: Secondary | ICD-10-CM | POA: Diagnosis not present

## 2020-05-01 DIAGNOSIS — E039 Hypothyroidism, unspecified: Secondary | ICD-10-CM | POA: Diagnosis not present

## 2020-05-01 DIAGNOSIS — E43 Unspecified severe protein-calorie malnutrition: Secondary | ICD-10-CM | POA: Diagnosis not present

## 2020-05-01 DIAGNOSIS — J9611 Chronic respiratory failure with hypoxia: Secondary | ICD-10-CM | POA: Diagnosis not present

## 2020-05-03 DIAGNOSIS — E039 Hypothyroidism, unspecified: Secondary | ICD-10-CM | POA: Diagnosis not present

## 2020-05-03 DIAGNOSIS — R0602 Shortness of breath: Secondary | ICD-10-CM | POA: Diagnosis not present

## 2020-05-03 DIAGNOSIS — I5033 Acute on chronic diastolic (congestive) heart failure: Secondary | ICD-10-CM | POA: Diagnosis not present

## 2020-05-03 DIAGNOSIS — J9611 Chronic respiratory failure with hypoxia: Secondary | ICD-10-CM | POA: Diagnosis not present

## 2020-05-03 DIAGNOSIS — J449 Chronic obstructive pulmonary disease, unspecified: Secondary | ICD-10-CM | POA: Diagnosis not present

## 2020-05-03 DIAGNOSIS — I5032 Chronic diastolic (congestive) heart failure: Secondary | ICD-10-CM | POA: Diagnosis not present

## 2020-05-03 DIAGNOSIS — R0600 Dyspnea, unspecified: Secondary | ICD-10-CM | POA: Diagnosis not present

## 2020-05-03 DIAGNOSIS — R531 Weakness: Secondary | ICD-10-CM | POA: Diagnosis not present

## 2020-05-03 DIAGNOSIS — I4891 Unspecified atrial fibrillation: Secondary | ICD-10-CM | POA: Diagnosis not present

## 2020-05-03 DIAGNOSIS — Z9981 Dependence on supplemental oxygen: Secondary | ICD-10-CM | POA: Diagnosis not present

## 2020-05-03 DIAGNOSIS — J918 Pleural effusion in other conditions classified elsewhere: Secondary | ICD-10-CM | POA: Diagnosis not present

## 2020-05-03 DIAGNOSIS — E43 Unspecified severe protein-calorie malnutrition: Secondary | ICD-10-CM | POA: Diagnosis not present

## 2020-05-06 DIAGNOSIS — J449 Chronic obstructive pulmonary disease, unspecified: Secondary | ICD-10-CM | POA: Diagnosis not present

## 2020-05-06 DIAGNOSIS — R531 Weakness: Secondary | ICD-10-CM | POA: Diagnosis not present

## 2020-05-06 DIAGNOSIS — E43 Unspecified severe protein-calorie malnutrition: Secondary | ICD-10-CM | POA: Diagnosis not present

## 2020-05-06 DIAGNOSIS — J9611 Chronic respiratory failure with hypoxia: Secondary | ICD-10-CM | POA: Diagnosis not present

## 2020-05-06 DIAGNOSIS — E039 Hypothyroidism, unspecified: Secondary | ICD-10-CM | POA: Diagnosis not present

## 2020-05-06 DIAGNOSIS — I5033 Acute on chronic diastolic (congestive) heart failure: Secondary | ICD-10-CM | POA: Diagnosis not present

## 2020-05-10 DIAGNOSIS — E43 Unspecified severe protein-calorie malnutrition: Secondary | ICD-10-CM | POA: Diagnosis not present

## 2020-05-10 DIAGNOSIS — J449 Chronic obstructive pulmonary disease, unspecified: Secondary | ICD-10-CM | POA: Diagnosis not present

## 2020-05-10 DIAGNOSIS — I5033 Acute on chronic diastolic (congestive) heart failure: Secondary | ICD-10-CM | POA: Diagnosis not present

## 2020-05-10 DIAGNOSIS — J9611 Chronic respiratory failure with hypoxia: Secondary | ICD-10-CM | POA: Diagnosis not present

## 2020-05-10 DIAGNOSIS — E039 Hypothyroidism, unspecified: Secondary | ICD-10-CM | POA: Diagnosis not present

## 2020-05-10 DIAGNOSIS — R531 Weakness: Secondary | ICD-10-CM | POA: Diagnosis not present

## 2020-05-13 DIAGNOSIS — J9611 Chronic respiratory failure with hypoxia: Secondary | ICD-10-CM | POA: Diagnosis not present

## 2020-05-13 DIAGNOSIS — E039 Hypothyroidism, unspecified: Secondary | ICD-10-CM | POA: Diagnosis not present

## 2020-05-13 DIAGNOSIS — R531 Weakness: Secondary | ICD-10-CM | POA: Diagnosis not present

## 2020-05-13 DIAGNOSIS — J449 Chronic obstructive pulmonary disease, unspecified: Secondary | ICD-10-CM | POA: Diagnosis not present

## 2020-05-13 DIAGNOSIS — E43 Unspecified severe protein-calorie malnutrition: Secondary | ICD-10-CM | POA: Diagnosis not present

## 2020-05-13 DIAGNOSIS — I5033 Acute on chronic diastolic (congestive) heart failure: Secondary | ICD-10-CM | POA: Diagnosis not present

## 2020-05-17 DIAGNOSIS — J449 Chronic obstructive pulmonary disease, unspecified: Secondary | ICD-10-CM | POA: Diagnosis not present

## 2020-05-17 DIAGNOSIS — R531 Weakness: Secondary | ICD-10-CM | POA: Diagnosis not present

## 2020-05-17 DIAGNOSIS — E039 Hypothyroidism, unspecified: Secondary | ICD-10-CM | POA: Diagnosis not present

## 2020-05-17 DIAGNOSIS — E43 Unspecified severe protein-calorie malnutrition: Secondary | ICD-10-CM | POA: Diagnosis not present

## 2020-05-17 DIAGNOSIS — J9611 Chronic respiratory failure with hypoxia: Secondary | ICD-10-CM | POA: Diagnosis not present

## 2020-05-17 DIAGNOSIS — I5033 Acute on chronic diastolic (congestive) heart failure: Secondary | ICD-10-CM | POA: Diagnosis not present

## 2020-05-19 DIAGNOSIS — J9611 Chronic respiratory failure with hypoxia: Secondary | ICD-10-CM | POA: Diagnosis not present

## 2020-05-19 DIAGNOSIS — E039 Hypothyroidism, unspecified: Secondary | ICD-10-CM | POA: Diagnosis not present

## 2020-05-19 DIAGNOSIS — R531 Weakness: Secondary | ICD-10-CM | POA: Diagnosis not present

## 2020-05-19 DIAGNOSIS — I5033 Acute on chronic diastolic (congestive) heart failure: Secondary | ICD-10-CM | POA: Diagnosis not present

## 2020-05-19 DIAGNOSIS — E43 Unspecified severe protein-calorie malnutrition: Secondary | ICD-10-CM | POA: Diagnosis not present

## 2020-05-19 DIAGNOSIS — J449 Chronic obstructive pulmonary disease, unspecified: Secondary | ICD-10-CM | POA: Diagnosis not present

## 2020-05-20 DIAGNOSIS — J9611 Chronic respiratory failure with hypoxia: Secondary | ICD-10-CM | POA: Diagnosis not present

## 2020-05-20 DIAGNOSIS — R531 Weakness: Secondary | ICD-10-CM | POA: Diagnosis not present

## 2020-05-20 DIAGNOSIS — I5033 Acute on chronic diastolic (congestive) heart failure: Secondary | ICD-10-CM | POA: Diagnosis not present

## 2020-05-20 DIAGNOSIS — E039 Hypothyroidism, unspecified: Secondary | ICD-10-CM | POA: Diagnosis not present

## 2020-05-20 DIAGNOSIS — E43 Unspecified severe protein-calorie malnutrition: Secondary | ICD-10-CM | POA: Diagnosis not present

## 2020-05-20 DIAGNOSIS — J449 Chronic obstructive pulmonary disease, unspecified: Secondary | ICD-10-CM | POA: Diagnosis not present

## 2020-05-22 DIAGNOSIS — J9611 Chronic respiratory failure with hypoxia: Secondary | ICD-10-CM | POA: Diagnosis not present

## 2020-05-22 DIAGNOSIS — J449 Chronic obstructive pulmonary disease, unspecified: Secondary | ICD-10-CM | POA: Diagnosis not present

## 2020-05-22 DIAGNOSIS — I5033 Acute on chronic diastolic (congestive) heart failure: Secondary | ICD-10-CM | POA: Diagnosis not present

## 2020-05-22 DIAGNOSIS — R531 Weakness: Secondary | ICD-10-CM | POA: Diagnosis not present

## 2020-05-22 DIAGNOSIS — E43 Unspecified severe protein-calorie malnutrition: Secondary | ICD-10-CM | POA: Diagnosis not present

## 2020-05-22 DIAGNOSIS — E039 Hypothyroidism, unspecified: Secondary | ICD-10-CM | POA: Diagnosis not present

## 2020-05-24 DIAGNOSIS — E43 Unspecified severe protein-calorie malnutrition: Secondary | ICD-10-CM | POA: Diagnosis not present

## 2020-05-24 DIAGNOSIS — I5033 Acute on chronic diastolic (congestive) heart failure: Secondary | ICD-10-CM | POA: Diagnosis not present

## 2020-05-24 DIAGNOSIS — E039 Hypothyroidism, unspecified: Secondary | ICD-10-CM | POA: Diagnosis not present

## 2020-05-24 DIAGNOSIS — R531 Weakness: Secondary | ICD-10-CM | POA: Diagnosis not present

## 2020-05-24 DIAGNOSIS — J9611 Chronic respiratory failure with hypoxia: Secondary | ICD-10-CM | POA: Diagnosis not present

## 2020-05-24 DIAGNOSIS — J449 Chronic obstructive pulmonary disease, unspecified: Secondary | ICD-10-CM | POA: Diagnosis not present

## 2020-05-27 DIAGNOSIS — R531 Weakness: Secondary | ICD-10-CM | POA: Diagnosis not present

## 2020-05-27 DIAGNOSIS — E43 Unspecified severe protein-calorie malnutrition: Secondary | ICD-10-CM | POA: Diagnosis not present

## 2020-05-27 DIAGNOSIS — J9611 Chronic respiratory failure with hypoxia: Secondary | ICD-10-CM | POA: Diagnosis not present

## 2020-05-27 DIAGNOSIS — E039 Hypothyroidism, unspecified: Secondary | ICD-10-CM | POA: Diagnosis not present

## 2020-05-27 DIAGNOSIS — J449 Chronic obstructive pulmonary disease, unspecified: Secondary | ICD-10-CM | POA: Diagnosis not present

## 2020-05-27 DIAGNOSIS — I5033 Acute on chronic diastolic (congestive) heart failure: Secondary | ICD-10-CM | POA: Diagnosis not present

## 2020-05-29 DIAGNOSIS — E039 Hypothyroidism, unspecified: Secondary | ICD-10-CM | POA: Diagnosis not present

## 2020-05-29 DIAGNOSIS — E43 Unspecified severe protein-calorie malnutrition: Secondary | ICD-10-CM | POA: Diagnosis not present

## 2020-05-29 DIAGNOSIS — J449 Chronic obstructive pulmonary disease, unspecified: Secondary | ICD-10-CM | POA: Diagnosis not present

## 2020-05-29 DIAGNOSIS — I5033 Acute on chronic diastolic (congestive) heart failure: Secondary | ICD-10-CM | POA: Diagnosis not present

## 2020-05-29 DIAGNOSIS — J9611 Chronic respiratory failure with hypoxia: Secondary | ICD-10-CM | POA: Diagnosis not present

## 2020-05-29 DIAGNOSIS — R531 Weakness: Secondary | ICD-10-CM | POA: Diagnosis not present

## 2020-05-31 DIAGNOSIS — J449 Chronic obstructive pulmonary disease, unspecified: Secondary | ICD-10-CM | POA: Diagnosis not present

## 2020-05-31 DIAGNOSIS — J9611 Chronic respiratory failure with hypoxia: Secondary | ICD-10-CM | POA: Diagnosis not present

## 2020-05-31 DIAGNOSIS — E43 Unspecified severe protein-calorie malnutrition: Secondary | ICD-10-CM | POA: Diagnosis not present

## 2020-05-31 DIAGNOSIS — I5033 Acute on chronic diastolic (congestive) heart failure: Secondary | ICD-10-CM | POA: Diagnosis not present

## 2020-05-31 DIAGNOSIS — R531 Weakness: Secondary | ICD-10-CM | POA: Diagnosis not present

## 2020-05-31 DIAGNOSIS — E039 Hypothyroidism, unspecified: Secondary | ICD-10-CM | POA: Diagnosis not present

## 2020-06-03 DIAGNOSIS — E039 Hypothyroidism, unspecified: Secondary | ICD-10-CM | POA: Diagnosis not present

## 2020-06-03 DIAGNOSIS — I4891 Unspecified atrial fibrillation: Secondary | ICD-10-CM | POA: Diagnosis not present

## 2020-06-03 DIAGNOSIS — Z9981 Dependence on supplemental oxygen: Secondary | ICD-10-CM | POA: Diagnosis not present

## 2020-06-03 DIAGNOSIS — J9611 Chronic respiratory failure with hypoxia: Secondary | ICD-10-CM | POA: Diagnosis not present

## 2020-06-03 DIAGNOSIS — I5033 Acute on chronic diastolic (congestive) heart failure: Secondary | ICD-10-CM | POA: Diagnosis not present

## 2020-06-03 DIAGNOSIS — R531 Weakness: Secondary | ICD-10-CM | POA: Diagnosis not present

## 2020-06-03 DIAGNOSIS — I5032 Chronic diastolic (congestive) heart failure: Secondary | ICD-10-CM | POA: Diagnosis not present

## 2020-06-03 DIAGNOSIS — E43 Unspecified severe protein-calorie malnutrition: Secondary | ICD-10-CM | POA: Diagnosis not present

## 2020-06-03 DIAGNOSIS — R0602 Shortness of breath: Secondary | ICD-10-CM | POA: Diagnosis not present

## 2020-06-03 DIAGNOSIS — R0600 Dyspnea, unspecified: Secondary | ICD-10-CM | POA: Diagnosis not present

## 2020-06-03 DIAGNOSIS — J449 Chronic obstructive pulmonary disease, unspecified: Secondary | ICD-10-CM | POA: Diagnosis not present

## 2020-06-03 DIAGNOSIS — J918 Pleural effusion in other conditions classified elsewhere: Secondary | ICD-10-CM | POA: Diagnosis not present

## 2020-06-05 DIAGNOSIS — R531 Weakness: Secondary | ICD-10-CM | POA: Diagnosis not present

## 2020-06-05 DIAGNOSIS — E039 Hypothyroidism, unspecified: Secondary | ICD-10-CM | POA: Diagnosis not present

## 2020-06-05 DIAGNOSIS — I5033 Acute on chronic diastolic (congestive) heart failure: Secondary | ICD-10-CM | POA: Diagnosis not present

## 2020-06-05 DIAGNOSIS — E43 Unspecified severe protein-calorie malnutrition: Secondary | ICD-10-CM | POA: Diagnosis not present

## 2020-06-05 DIAGNOSIS — J9611 Chronic respiratory failure with hypoxia: Secondary | ICD-10-CM | POA: Diagnosis not present

## 2020-06-05 DIAGNOSIS — J449 Chronic obstructive pulmonary disease, unspecified: Secondary | ICD-10-CM | POA: Diagnosis not present

## 2020-06-06 DIAGNOSIS — I5033 Acute on chronic diastolic (congestive) heart failure: Secondary | ICD-10-CM | POA: Diagnosis not present

## 2020-06-06 DIAGNOSIS — R531 Weakness: Secondary | ICD-10-CM | POA: Diagnosis not present

## 2020-06-06 DIAGNOSIS — E43 Unspecified severe protein-calorie malnutrition: Secondary | ICD-10-CM | POA: Diagnosis not present

## 2020-06-06 DIAGNOSIS — J449 Chronic obstructive pulmonary disease, unspecified: Secondary | ICD-10-CM | POA: Diagnosis not present

## 2020-06-06 DIAGNOSIS — E039 Hypothyroidism, unspecified: Secondary | ICD-10-CM | POA: Diagnosis not present

## 2020-06-06 DIAGNOSIS — J9611 Chronic respiratory failure with hypoxia: Secondary | ICD-10-CM | POA: Diagnosis not present

## 2020-06-07 DIAGNOSIS — R531 Weakness: Secondary | ICD-10-CM | POA: Diagnosis not present

## 2020-06-07 DIAGNOSIS — E43 Unspecified severe protein-calorie malnutrition: Secondary | ICD-10-CM | POA: Diagnosis not present

## 2020-06-07 DIAGNOSIS — E039 Hypothyroidism, unspecified: Secondary | ICD-10-CM | POA: Diagnosis not present

## 2020-06-07 DIAGNOSIS — J9611 Chronic respiratory failure with hypoxia: Secondary | ICD-10-CM | POA: Diagnosis not present

## 2020-06-07 DIAGNOSIS — J449 Chronic obstructive pulmonary disease, unspecified: Secondary | ICD-10-CM | POA: Diagnosis not present

## 2020-06-07 DIAGNOSIS — I5033 Acute on chronic diastolic (congestive) heart failure: Secondary | ICD-10-CM | POA: Diagnosis not present

## 2020-06-10 DIAGNOSIS — J9611 Chronic respiratory failure with hypoxia: Secondary | ICD-10-CM | POA: Diagnosis not present

## 2020-06-10 DIAGNOSIS — I5033 Acute on chronic diastolic (congestive) heart failure: Secondary | ICD-10-CM | POA: Diagnosis not present

## 2020-06-10 DIAGNOSIS — E43 Unspecified severe protein-calorie malnutrition: Secondary | ICD-10-CM | POA: Diagnosis not present

## 2020-06-10 DIAGNOSIS — J449 Chronic obstructive pulmonary disease, unspecified: Secondary | ICD-10-CM | POA: Diagnosis not present

## 2020-06-10 DIAGNOSIS — R531 Weakness: Secondary | ICD-10-CM | POA: Diagnosis not present

## 2020-06-10 DIAGNOSIS — E039 Hypothyroidism, unspecified: Secondary | ICD-10-CM | POA: Diagnosis not present

## 2020-06-12 DIAGNOSIS — E43 Unspecified severe protein-calorie malnutrition: Secondary | ICD-10-CM | POA: Diagnosis not present

## 2020-06-12 DIAGNOSIS — I5033 Acute on chronic diastolic (congestive) heart failure: Secondary | ICD-10-CM | POA: Diagnosis not present

## 2020-06-12 DIAGNOSIS — J449 Chronic obstructive pulmonary disease, unspecified: Secondary | ICD-10-CM | POA: Diagnosis not present

## 2020-06-12 DIAGNOSIS — E039 Hypothyroidism, unspecified: Secondary | ICD-10-CM | POA: Diagnosis not present

## 2020-06-12 DIAGNOSIS — R531 Weakness: Secondary | ICD-10-CM | POA: Diagnosis not present

## 2020-06-12 DIAGNOSIS — J9611 Chronic respiratory failure with hypoxia: Secondary | ICD-10-CM | POA: Diagnosis not present

## 2020-06-13 DIAGNOSIS — E43 Unspecified severe protein-calorie malnutrition: Secondary | ICD-10-CM | POA: Diagnosis not present

## 2020-06-13 DIAGNOSIS — J449 Chronic obstructive pulmonary disease, unspecified: Secondary | ICD-10-CM | POA: Diagnosis not present

## 2020-06-13 DIAGNOSIS — R531 Weakness: Secondary | ICD-10-CM | POA: Diagnosis not present

## 2020-06-13 DIAGNOSIS — J9611 Chronic respiratory failure with hypoxia: Secondary | ICD-10-CM | POA: Diagnosis not present

## 2020-06-13 DIAGNOSIS — I5033 Acute on chronic diastolic (congestive) heart failure: Secondary | ICD-10-CM | POA: Diagnosis not present

## 2020-06-13 DIAGNOSIS — E039 Hypothyroidism, unspecified: Secondary | ICD-10-CM | POA: Diagnosis not present

## 2020-06-14 DIAGNOSIS — J9611 Chronic respiratory failure with hypoxia: Secondary | ICD-10-CM | POA: Diagnosis not present

## 2020-06-14 DIAGNOSIS — I5033 Acute on chronic diastolic (congestive) heart failure: Secondary | ICD-10-CM | POA: Diagnosis not present

## 2020-06-14 DIAGNOSIS — E039 Hypothyroidism, unspecified: Secondary | ICD-10-CM | POA: Diagnosis not present

## 2020-06-14 DIAGNOSIS — J449 Chronic obstructive pulmonary disease, unspecified: Secondary | ICD-10-CM | POA: Diagnosis not present

## 2020-06-14 DIAGNOSIS — R531 Weakness: Secondary | ICD-10-CM | POA: Diagnosis not present

## 2020-06-14 DIAGNOSIS — E43 Unspecified severe protein-calorie malnutrition: Secondary | ICD-10-CM | POA: Diagnosis not present

## 2020-06-17 DIAGNOSIS — R531 Weakness: Secondary | ICD-10-CM | POA: Diagnosis not present

## 2020-06-17 DIAGNOSIS — E43 Unspecified severe protein-calorie malnutrition: Secondary | ICD-10-CM | POA: Diagnosis not present

## 2020-06-17 DIAGNOSIS — I5033 Acute on chronic diastolic (congestive) heart failure: Secondary | ICD-10-CM | POA: Diagnosis not present

## 2020-06-17 DIAGNOSIS — J9611 Chronic respiratory failure with hypoxia: Secondary | ICD-10-CM | POA: Diagnosis not present

## 2020-06-17 DIAGNOSIS — E039 Hypothyroidism, unspecified: Secondary | ICD-10-CM | POA: Diagnosis not present

## 2020-06-17 DIAGNOSIS — J449 Chronic obstructive pulmonary disease, unspecified: Secondary | ICD-10-CM | POA: Diagnosis not present

## 2020-06-18 DIAGNOSIS — R531 Weakness: Secondary | ICD-10-CM | POA: Diagnosis not present

## 2020-06-18 DIAGNOSIS — E039 Hypothyroidism, unspecified: Secondary | ICD-10-CM | POA: Diagnosis not present

## 2020-06-18 DIAGNOSIS — J449 Chronic obstructive pulmonary disease, unspecified: Secondary | ICD-10-CM | POA: Diagnosis not present

## 2020-06-18 DIAGNOSIS — I5033 Acute on chronic diastolic (congestive) heart failure: Secondary | ICD-10-CM | POA: Diagnosis not present

## 2020-06-18 DIAGNOSIS — E43 Unspecified severe protein-calorie malnutrition: Secondary | ICD-10-CM | POA: Diagnosis not present

## 2020-06-18 DIAGNOSIS — J9611 Chronic respiratory failure with hypoxia: Secondary | ICD-10-CM | POA: Diagnosis not present

## 2020-06-20 DIAGNOSIS — I5033 Acute on chronic diastolic (congestive) heart failure: Secondary | ICD-10-CM | POA: Diagnosis not present

## 2020-06-20 DIAGNOSIS — J9611 Chronic respiratory failure with hypoxia: Secondary | ICD-10-CM | POA: Diagnosis not present

## 2020-06-20 DIAGNOSIS — R531 Weakness: Secondary | ICD-10-CM | POA: Diagnosis not present

## 2020-06-20 DIAGNOSIS — J449 Chronic obstructive pulmonary disease, unspecified: Secondary | ICD-10-CM | POA: Diagnosis not present

## 2020-06-20 DIAGNOSIS — E039 Hypothyroidism, unspecified: Secondary | ICD-10-CM | POA: Diagnosis not present

## 2020-06-20 DIAGNOSIS — E43 Unspecified severe protein-calorie malnutrition: Secondary | ICD-10-CM | POA: Diagnosis not present

## 2020-06-24 DIAGNOSIS — E039 Hypothyroidism, unspecified: Secondary | ICD-10-CM | POA: Diagnosis not present

## 2020-06-24 DIAGNOSIS — I5033 Acute on chronic diastolic (congestive) heart failure: Secondary | ICD-10-CM | POA: Diagnosis not present

## 2020-06-24 DIAGNOSIS — E43 Unspecified severe protein-calorie malnutrition: Secondary | ICD-10-CM | POA: Diagnosis not present

## 2020-06-24 DIAGNOSIS — J449 Chronic obstructive pulmonary disease, unspecified: Secondary | ICD-10-CM | POA: Diagnosis not present

## 2020-06-24 DIAGNOSIS — J9611 Chronic respiratory failure with hypoxia: Secondary | ICD-10-CM | POA: Diagnosis not present

## 2020-06-24 DIAGNOSIS — R531 Weakness: Secondary | ICD-10-CM | POA: Diagnosis not present

## 2020-06-25 DIAGNOSIS — E039 Hypothyroidism, unspecified: Secondary | ICD-10-CM | POA: Diagnosis not present

## 2020-06-25 DIAGNOSIS — J9611 Chronic respiratory failure with hypoxia: Secondary | ICD-10-CM | POA: Diagnosis not present

## 2020-06-25 DIAGNOSIS — R531 Weakness: Secondary | ICD-10-CM | POA: Diagnosis not present

## 2020-06-25 DIAGNOSIS — E43 Unspecified severe protein-calorie malnutrition: Secondary | ICD-10-CM | POA: Diagnosis not present

## 2020-06-25 DIAGNOSIS — J449 Chronic obstructive pulmonary disease, unspecified: Secondary | ICD-10-CM | POA: Diagnosis not present

## 2020-06-25 DIAGNOSIS — Z23 Encounter for immunization: Secondary | ICD-10-CM | POA: Diagnosis not present

## 2020-06-25 DIAGNOSIS — I5033 Acute on chronic diastolic (congestive) heart failure: Secondary | ICD-10-CM | POA: Diagnosis not present

## 2020-06-28 ENCOUNTER — Other Ambulatory Visit (HOSPITAL_COMMUNITY)
Admission: RE | Admit: 2020-06-28 | Discharge: 2020-06-28 | Disposition: A | Discharge status: Home / Self Care | Payer: Medicare Other | Source: Skilled Nursing Facility | Attending: Adult Health Nurse Practitioner | Admitting: Adult Health Nurse Practitioner

## 2020-06-28 DIAGNOSIS — R531 Weakness: Secondary | ICD-10-CM | POA: Diagnosis not present

## 2020-06-28 DIAGNOSIS — J449 Chronic obstructive pulmonary disease, unspecified: Secondary | ICD-10-CM | POA: Diagnosis not present

## 2020-06-28 DIAGNOSIS — J9611 Chronic respiratory failure with hypoxia: Secondary | ICD-10-CM | POA: Diagnosis not present

## 2020-06-28 DIAGNOSIS — N390 Urinary tract infection, site not specified: Secondary | ICD-10-CM | POA: Insufficient documentation

## 2020-06-28 DIAGNOSIS — E039 Hypothyroidism, unspecified: Secondary | ICD-10-CM | POA: Diagnosis not present

## 2020-06-28 DIAGNOSIS — E43 Unspecified severe protein-calorie malnutrition: Secondary | ICD-10-CM | POA: Diagnosis not present

## 2020-06-28 DIAGNOSIS — I5033 Acute on chronic diastolic (congestive) heart failure: Secondary | ICD-10-CM | POA: Diagnosis not present

## 2020-06-28 LAB — URINALYSIS, ROUTINE W REFLEX MICROSCOPIC
Bilirubin Urine: NEGATIVE
Glucose, UA: NEGATIVE mg/dL
Hgb urine dipstick: NEGATIVE
Ketones, ur: NEGATIVE mg/dL
Leukocytes,Ua: NEGATIVE
Nitrite: NEGATIVE
Protein, ur: 30 mg/dL — AB
Specific Gravity, Urine: 1.025 (ref 1.005–1.030)
pH: 6 (ref 5.0–8.0)

## 2020-06-28 LAB — URINALYSIS, MICROSCOPIC (REFLEX): RBC / HPF: NONE SEEN RBC/hpf (ref 0–5)

## 2020-07-01 DIAGNOSIS — E43 Unspecified severe protein-calorie malnutrition: Secondary | ICD-10-CM | POA: Diagnosis not present

## 2020-07-01 DIAGNOSIS — J449 Chronic obstructive pulmonary disease, unspecified: Secondary | ICD-10-CM | POA: Diagnosis not present

## 2020-07-01 DIAGNOSIS — I5033 Acute on chronic diastolic (congestive) heart failure: Secondary | ICD-10-CM | POA: Diagnosis not present

## 2020-07-01 DIAGNOSIS — R531 Weakness: Secondary | ICD-10-CM | POA: Diagnosis not present

## 2020-07-01 DIAGNOSIS — J9611 Chronic respiratory failure with hypoxia: Secondary | ICD-10-CM | POA: Diagnosis not present

## 2020-07-01 DIAGNOSIS — E039 Hypothyroidism, unspecified: Secondary | ICD-10-CM | POA: Diagnosis not present

## 2020-07-03 DIAGNOSIS — J9611 Chronic respiratory failure with hypoxia: Secondary | ICD-10-CM | POA: Diagnosis not present

## 2020-07-03 DIAGNOSIS — Z9981 Dependence on supplemental oxygen: Secondary | ICD-10-CM | POA: Diagnosis not present

## 2020-07-03 DIAGNOSIS — I4891 Unspecified atrial fibrillation: Secondary | ICD-10-CM | POA: Diagnosis not present

## 2020-07-03 DIAGNOSIS — I5033 Acute on chronic diastolic (congestive) heart failure: Secondary | ICD-10-CM | POA: Diagnosis not present

## 2020-07-03 DIAGNOSIS — I5032 Chronic diastolic (congestive) heart failure: Secondary | ICD-10-CM | POA: Diagnosis not present

## 2020-07-03 DIAGNOSIS — R0600 Dyspnea, unspecified: Secondary | ICD-10-CM | POA: Diagnosis not present

## 2020-07-03 DIAGNOSIS — E039 Hypothyroidism, unspecified: Secondary | ICD-10-CM | POA: Diagnosis not present

## 2020-07-03 DIAGNOSIS — J918 Pleural effusion in other conditions classified elsewhere: Secondary | ICD-10-CM | POA: Diagnosis not present

## 2020-07-03 DIAGNOSIS — J449 Chronic obstructive pulmonary disease, unspecified: Secondary | ICD-10-CM | POA: Diagnosis not present

## 2020-07-03 DIAGNOSIS — E43 Unspecified severe protein-calorie malnutrition: Secondary | ICD-10-CM | POA: Diagnosis not present

## 2020-07-03 DIAGNOSIS — R0602 Shortness of breath: Secondary | ICD-10-CM | POA: Diagnosis not present

## 2020-07-03 DIAGNOSIS — R531 Weakness: Secondary | ICD-10-CM | POA: Diagnosis not present

## 2020-07-04 DIAGNOSIS — J449 Chronic obstructive pulmonary disease, unspecified: Secondary | ICD-10-CM | POA: Diagnosis not present

## 2020-07-04 DIAGNOSIS — E43 Unspecified severe protein-calorie malnutrition: Secondary | ICD-10-CM | POA: Diagnosis not present

## 2020-07-04 DIAGNOSIS — E039 Hypothyroidism, unspecified: Secondary | ICD-10-CM | POA: Diagnosis not present

## 2020-07-04 DIAGNOSIS — I5033 Acute on chronic diastolic (congestive) heart failure: Secondary | ICD-10-CM | POA: Diagnosis not present

## 2020-07-04 DIAGNOSIS — J9611 Chronic respiratory failure with hypoxia: Secondary | ICD-10-CM | POA: Diagnosis not present

## 2020-07-04 DIAGNOSIS — R531 Weakness: Secondary | ICD-10-CM | POA: Diagnosis not present

## 2020-07-05 DIAGNOSIS — J9611 Chronic respiratory failure with hypoxia: Secondary | ICD-10-CM | POA: Diagnosis not present

## 2020-07-05 DIAGNOSIS — I5033 Acute on chronic diastolic (congestive) heart failure: Secondary | ICD-10-CM | POA: Diagnosis not present

## 2020-07-05 DIAGNOSIS — R531 Weakness: Secondary | ICD-10-CM | POA: Diagnosis not present

## 2020-07-05 DIAGNOSIS — J449 Chronic obstructive pulmonary disease, unspecified: Secondary | ICD-10-CM | POA: Diagnosis not present

## 2020-07-05 DIAGNOSIS — E43 Unspecified severe protein-calorie malnutrition: Secondary | ICD-10-CM | POA: Diagnosis not present

## 2020-07-05 DIAGNOSIS — E039 Hypothyroidism, unspecified: Secondary | ICD-10-CM | POA: Diagnosis not present

## 2020-07-08 DIAGNOSIS — I5033 Acute on chronic diastolic (congestive) heart failure: Secondary | ICD-10-CM | POA: Diagnosis not present

## 2020-07-08 DIAGNOSIS — J9611 Chronic respiratory failure with hypoxia: Secondary | ICD-10-CM | POA: Diagnosis not present

## 2020-07-08 DIAGNOSIS — E039 Hypothyroidism, unspecified: Secondary | ICD-10-CM | POA: Diagnosis not present

## 2020-07-08 DIAGNOSIS — E43 Unspecified severe protein-calorie malnutrition: Secondary | ICD-10-CM | POA: Diagnosis not present

## 2020-07-08 DIAGNOSIS — J449 Chronic obstructive pulmonary disease, unspecified: Secondary | ICD-10-CM | POA: Diagnosis not present

## 2020-07-08 DIAGNOSIS — R531 Weakness: Secondary | ICD-10-CM | POA: Diagnosis not present

## 2020-07-10 DIAGNOSIS — I5033 Acute on chronic diastolic (congestive) heart failure: Secondary | ICD-10-CM | POA: Diagnosis not present

## 2020-07-10 DIAGNOSIS — J9611 Chronic respiratory failure with hypoxia: Secondary | ICD-10-CM | POA: Diagnosis not present

## 2020-07-10 DIAGNOSIS — E039 Hypothyroidism, unspecified: Secondary | ICD-10-CM | POA: Diagnosis not present

## 2020-07-10 DIAGNOSIS — E43 Unspecified severe protein-calorie malnutrition: Secondary | ICD-10-CM | POA: Diagnosis not present

## 2020-07-10 DIAGNOSIS — J449 Chronic obstructive pulmonary disease, unspecified: Secondary | ICD-10-CM | POA: Diagnosis not present

## 2020-07-10 DIAGNOSIS — R531 Weakness: Secondary | ICD-10-CM | POA: Diagnosis not present

## 2020-07-12 DIAGNOSIS — R531 Weakness: Secondary | ICD-10-CM | POA: Diagnosis not present

## 2020-07-12 DIAGNOSIS — J9611 Chronic respiratory failure with hypoxia: Secondary | ICD-10-CM | POA: Diagnosis not present

## 2020-07-12 DIAGNOSIS — J449 Chronic obstructive pulmonary disease, unspecified: Secondary | ICD-10-CM | POA: Diagnosis not present

## 2020-07-12 DIAGNOSIS — I5033 Acute on chronic diastolic (congestive) heart failure: Secondary | ICD-10-CM | POA: Diagnosis not present

## 2020-07-12 DIAGNOSIS — E039 Hypothyroidism, unspecified: Secondary | ICD-10-CM | POA: Diagnosis not present

## 2020-07-12 DIAGNOSIS — E43 Unspecified severe protein-calorie malnutrition: Secondary | ICD-10-CM | POA: Diagnosis not present

## 2020-07-15 DIAGNOSIS — J9611 Chronic respiratory failure with hypoxia: Secondary | ICD-10-CM | POA: Diagnosis not present

## 2020-07-15 DIAGNOSIS — E039 Hypothyroidism, unspecified: Secondary | ICD-10-CM | POA: Diagnosis not present

## 2020-07-15 DIAGNOSIS — J449 Chronic obstructive pulmonary disease, unspecified: Secondary | ICD-10-CM | POA: Diagnosis not present

## 2020-07-15 DIAGNOSIS — R531 Weakness: Secondary | ICD-10-CM | POA: Diagnosis not present

## 2020-07-15 DIAGNOSIS — I5033 Acute on chronic diastolic (congestive) heart failure: Secondary | ICD-10-CM | POA: Diagnosis not present

## 2020-07-15 DIAGNOSIS — E43 Unspecified severe protein-calorie malnutrition: Secondary | ICD-10-CM | POA: Diagnosis not present

## 2020-07-17 DIAGNOSIS — E43 Unspecified severe protein-calorie malnutrition: Secondary | ICD-10-CM | POA: Diagnosis not present

## 2020-07-17 DIAGNOSIS — R531 Weakness: Secondary | ICD-10-CM | POA: Diagnosis not present

## 2020-07-17 DIAGNOSIS — I5033 Acute on chronic diastolic (congestive) heart failure: Secondary | ICD-10-CM | POA: Diagnosis not present

## 2020-07-17 DIAGNOSIS — E039 Hypothyroidism, unspecified: Secondary | ICD-10-CM | POA: Diagnosis not present

## 2020-07-17 DIAGNOSIS — J449 Chronic obstructive pulmonary disease, unspecified: Secondary | ICD-10-CM | POA: Diagnosis not present

## 2020-07-17 DIAGNOSIS — J9611 Chronic respiratory failure with hypoxia: Secondary | ICD-10-CM | POA: Diagnosis not present

## 2020-07-18 DIAGNOSIS — J449 Chronic obstructive pulmonary disease, unspecified: Secondary | ICD-10-CM | POA: Diagnosis not present

## 2020-07-18 DIAGNOSIS — E039 Hypothyroidism, unspecified: Secondary | ICD-10-CM | POA: Diagnosis not present

## 2020-07-18 DIAGNOSIS — J9611 Chronic respiratory failure with hypoxia: Secondary | ICD-10-CM | POA: Diagnosis not present

## 2020-07-18 DIAGNOSIS — I5033 Acute on chronic diastolic (congestive) heart failure: Secondary | ICD-10-CM | POA: Diagnosis not present

## 2020-07-18 DIAGNOSIS — R531 Weakness: Secondary | ICD-10-CM | POA: Diagnosis not present

## 2020-07-18 DIAGNOSIS — E43 Unspecified severe protein-calorie malnutrition: Secondary | ICD-10-CM | POA: Diagnosis not present

## 2020-07-22 DIAGNOSIS — E43 Unspecified severe protein-calorie malnutrition: Secondary | ICD-10-CM | POA: Diagnosis not present

## 2020-07-22 DIAGNOSIS — I5033 Acute on chronic diastolic (congestive) heart failure: Secondary | ICD-10-CM | POA: Diagnosis not present

## 2020-07-22 DIAGNOSIS — J449 Chronic obstructive pulmonary disease, unspecified: Secondary | ICD-10-CM | POA: Diagnosis not present

## 2020-07-22 DIAGNOSIS — J9611 Chronic respiratory failure with hypoxia: Secondary | ICD-10-CM | POA: Diagnosis not present

## 2020-07-22 DIAGNOSIS — E039 Hypothyroidism, unspecified: Secondary | ICD-10-CM | POA: Diagnosis not present

## 2020-07-22 DIAGNOSIS — R531 Weakness: Secondary | ICD-10-CM | POA: Diagnosis not present

## 2020-07-24 DIAGNOSIS — I5033 Acute on chronic diastolic (congestive) heart failure: Secondary | ICD-10-CM | POA: Diagnosis not present

## 2020-07-24 DIAGNOSIS — R531 Weakness: Secondary | ICD-10-CM | POA: Diagnosis not present

## 2020-07-24 DIAGNOSIS — J9611 Chronic respiratory failure with hypoxia: Secondary | ICD-10-CM | POA: Diagnosis not present

## 2020-07-24 DIAGNOSIS — E039 Hypothyroidism, unspecified: Secondary | ICD-10-CM | POA: Diagnosis not present

## 2020-07-24 DIAGNOSIS — E43 Unspecified severe protein-calorie malnutrition: Secondary | ICD-10-CM | POA: Diagnosis not present

## 2020-07-24 DIAGNOSIS — J449 Chronic obstructive pulmonary disease, unspecified: Secondary | ICD-10-CM | POA: Diagnosis not present

## 2020-07-29 DIAGNOSIS — E43 Unspecified severe protein-calorie malnutrition: Secondary | ICD-10-CM | POA: Diagnosis not present

## 2020-07-29 DIAGNOSIS — J9611 Chronic respiratory failure with hypoxia: Secondary | ICD-10-CM | POA: Diagnosis not present

## 2020-07-29 DIAGNOSIS — R531 Weakness: Secondary | ICD-10-CM | POA: Diagnosis not present

## 2020-07-29 DIAGNOSIS — I5033 Acute on chronic diastolic (congestive) heart failure: Secondary | ICD-10-CM | POA: Diagnosis not present

## 2020-07-29 DIAGNOSIS — E039 Hypothyroidism, unspecified: Secondary | ICD-10-CM | POA: Diagnosis not present

## 2020-07-29 DIAGNOSIS — J449 Chronic obstructive pulmonary disease, unspecified: Secondary | ICD-10-CM | POA: Diagnosis not present

## 2020-07-31 DIAGNOSIS — R531 Weakness: Secondary | ICD-10-CM | POA: Diagnosis not present

## 2020-07-31 DIAGNOSIS — J9611 Chronic respiratory failure with hypoxia: Secondary | ICD-10-CM | POA: Diagnosis not present

## 2020-07-31 DIAGNOSIS — E039 Hypothyroidism, unspecified: Secondary | ICD-10-CM | POA: Diagnosis not present

## 2020-07-31 DIAGNOSIS — J449 Chronic obstructive pulmonary disease, unspecified: Secondary | ICD-10-CM | POA: Diagnosis not present

## 2020-07-31 DIAGNOSIS — E43 Unspecified severe protein-calorie malnutrition: Secondary | ICD-10-CM | POA: Diagnosis not present

## 2020-07-31 DIAGNOSIS — I5033 Acute on chronic diastolic (congestive) heart failure: Secondary | ICD-10-CM | POA: Diagnosis not present

## 2020-08-01 DIAGNOSIS — J9611 Chronic respiratory failure with hypoxia: Secondary | ICD-10-CM | POA: Diagnosis not present

## 2020-08-01 DIAGNOSIS — I5033 Acute on chronic diastolic (congestive) heart failure: Secondary | ICD-10-CM | POA: Diagnosis not present

## 2020-08-01 DIAGNOSIS — R531 Weakness: Secondary | ICD-10-CM | POA: Diagnosis not present

## 2020-08-01 DIAGNOSIS — J449 Chronic obstructive pulmonary disease, unspecified: Secondary | ICD-10-CM | POA: Diagnosis not present

## 2020-08-01 DIAGNOSIS — E039 Hypothyroidism, unspecified: Secondary | ICD-10-CM | POA: Diagnosis not present

## 2020-08-01 DIAGNOSIS — E43 Unspecified severe protein-calorie malnutrition: Secondary | ICD-10-CM | POA: Diagnosis not present

## 2020-08-03 DIAGNOSIS — R0600 Dyspnea, unspecified: Secondary | ICD-10-CM | POA: Diagnosis not present

## 2020-08-03 DIAGNOSIS — I5033 Acute on chronic diastolic (congestive) heart failure: Secondary | ICD-10-CM | POA: Diagnosis not present

## 2020-08-03 DIAGNOSIS — I5032 Chronic diastolic (congestive) heart failure: Secondary | ICD-10-CM | POA: Diagnosis not present

## 2020-08-03 DIAGNOSIS — J918 Pleural effusion in other conditions classified elsewhere: Secondary | ICD-10-CM | POA: Diagnosis not present

## 2020-08-03 DIAGNOSIS — E43 Unspecified severe protein-calorie malnutrition: Secondary | ICD-10-CM | POA: Diagnosis not present

## 2020-08-03 DIAGNOSIS — R531 Weakness: Secondary | ICD-10-CM | POA: Diagnosis not present

## 2020-08-03 DIAGNOSIS — E039 Hypothyroidism, unspecified: Secondary | ICD-10-CM | POA: Diagnosis not present

## 2020-08-03 DIAGNOSIS — Z9981 Dependence on supplemental oxygen: Secondary | ICD-10-CM | POA: Diagnosis not present

## 2020-08-03 DIAGNOSIS — R0602 Shortness of breath: Secondary | ICD-10-CM | POA: Diagnosis not present

## 2020-08-03 DIAGNOSIS — I4891 Unspecified atrial fibrillation: Secondary | ICD-10-CM | POA: Diagnosis not present

## 2020-08-03 DIAGNOSIS — J449 Chronic obstructive pulmonary disease, unspecified: Secondary | ICD-10-CM | POA: Diagnosis not present

## 2020-08-03 DIAGNOSIS — J9611 Chronic respiratory failure with hypoxia: Secondary | ICD-10-CM | POA: Diagnosis not present

## 2020-09-03 DEATH — deceased
